# Patient Record
Sex: Female | Born: 1945
Health system: Southern US, Community
[De-identification: ages and names within clinical notes are randomized; demographics above are authoritative.]

## PROBLEM LIST (undated history)

## (undated) DIAGNOSIS — K760 Fatty (change of) liver, not elsewhere classified: Secondary | ICD-10-CM

## (undated) DIAGNOSIS — Z9221 Personal history of antineoplastic chemotherapy: Secondary | ICD-10-CM

## (undated) DIAGNOSIS — R200 Anesthesia of skin: Secondary | ICD-10-CM

## (undated) DIAGNOSIS — I10 Essential (primary) hypertension: Secondary | ICD-10-CM

## (undated) DIAGNOSIS — E669 Obesity, unspecified: Secondary | ICD-10-CM

## (undated) DIAGNOSIS — C50919 Malignant neoplasm of unspecified site of unspecified female breast: Secondary | ICD-10-CM

## (undated) DIAGNOSIS — D649 Anemia, unspecified: Secondary | ICD-10-CM

## (undated) DIAGNOSIS — T7840XA Allergy, unspecified, initial encounter: Secondary | ICD-10-CM

## (undated) DIAGNOSIS — N289 Disorder of kidney and ureter, unspecified: Secondary | ICD-10-CM

## (undated) DIAGNOSIS — E113291 Type 2 diabetes mellitus with mild nonproliferative diabetic retinopathy without macular edema, right eye: Secondary | ICD-10-CM

## (undated) DIAGNOSIS — M1710 Unilateral primary osteoarthritis, unspecified knee: Secondary | ICD-10-CM

## (undated) DIAGNOSIS — E785 Hyperlipidemia, unspecified: Secondary | ICD-10-CM

## (undated) DIAGNOSIS — Z923 Personal history of irradiation: Secondary | ICD-10-CM

## (undated) DIAGNOSIS — Z8639 Personal history of other endocrine, nutritional and metabolic disease: Secondary | ICD-10-CM

## (undated) DIAGNOSIS — M179 Osteoarthritis of knee, unspecified: Secondary | ICD-10-CM

## (undated) DIAGNOSIS — R202 Paresthesia of skin: Secondary | ICD-10-CM

## (undated) HISTORY — DX: Personal history of other endocrine, nutritional and metabolic disease: Z86.39

## (undated) HISTORY — DX: Fatty (change of) liver, not elsewhere classified: K76.0

## (undated) HISTORY — PX: TUBAL LIGATION: SHX77

## (undated) HISTORY — DX: Osteoarthritis of knee, unspecified: M17.9

## (undated) HISTORY — DX: Hyperlipidemia, unspecified: E78.5

## (undated) HISTORY — DX: Personal history of antineoplastic chemotherapy: Z92.21

## (undated) HISTORY — DX: Anemia, unspecified: D64.9

## (undated) HISTORY — PX: VAGINAL HYSTERECTOMY: SUR661

## (undated) HISTORY — DX: Allergy, unspecified, initial encounter: T78.40XA

## (undated) HISTORY — PX: PORTACATH PLACEMENT: SHX2246

## (undated) HISTORY — DX: Obesity, unspecified: E66.9

## (undated) HISTORY — PX: COLONOSCOPY W/ POLYPECTOMY: SHX1380

## (undated) HISTORY — DX: Disorder of kidney and ureter, unspecified: N28.9

## (undated) HISTORY — DX: Essential (primary) hypertension: I10

## (undated) HISTORY — PX: JOINT REPLACEMENT: SHX530

## (undated) HISTORY — DX: Unilateral primary osteoarthritis, unspecified knee: M17.10

## (undated) HISTORY — DX: Type 2 diabetes mellitus with mild nonproliferative diabetic retinopathy without macular edema, right eye: E11.3291

---

## 1998-02-21 ENCOUNTER — Ambulatory Visit (HOSPITAL_COMMUNITY): Admission: RE | Admit: 1998-02-21 | Discharge: 1998-02-21 | Payer: Self-pay

## 1998-02-22 ENCOUNTER — Encounter: Admission: RE | Admit: 1998-02-22 | Discharge: 1998-02-22 | Payer: Self-pay | Admitting: Internal Medicine

## 1998-02-22 ENCOUNTER — Encounter (INDEPENDENT_AMBULATORY_CARE_PROVIDER_SITE_OTHER): Payer: Self-pay | Admitting: Hospitalist

## 1998-02-22 ENCOUNTER — Ambulatory Visit (HOSPITAL_COMMUNITY): Admission: RE | Admit: 1998-02-22 | Discharge: 1998-02-22 | Payer: Self-pay | Admitting: *Deleted

## 1998-09-25 ENCOUNTER — Encounter: Admission: RE | Admit: 1998-09-25 | Discharge: 1998-09-25 | Payer: Self-pay | Admitting: Internal Medicine

## 1999-01-15 ENCOUNTER — Ambulatory Visit (HOSPITAL_COMMUNITY): Admission: RE | Admit: 1999-01-15 | Discharge: 1999-01-15 | Payer: Self-pay | Admitting: Gastroenterology

## 1999-01-17 ENCOUNTER — Encounter: Admission: RE | Admit: 1999-01-17 | Discharge: 1999-01-17 | Payer: Self-pay | Admitting: Hematology and Oncology

## 1999-07-11 ENCOUNTER — Encounter: Admission: RE | Admit: 1999-07-11 | Discharge: 1999-07-11 | Payer: Self-pay | Admitting: Hematology and Oncology

## 1999-07-22 ENCOUNTER — Encounter: Payer: Self-pay | Admitting: *Deleted

## 1999-07-22 ENCOUNTER — Ambulatory Visit (HOSPITAL_COMMUNITY): Admission: RE | Admit: 1999-07-22 | Discharge: 1999-07-22 | Payer: Self-pay | Admitting: *Deleted

## 1999-10-28 ENCOUNTER — Ambulatory Visit (HOSPITAL_BASED_OUTPATIENT_CLINIC_OR_DEPARTMENT_OTHER): Admission: RE | Admit: 1999-10-28 | Discharge: 1999-10-28 | Payer: Self-pay | Admitting: Orthopedic Surgery

## 1999-10-28 HISTORY — PX: OTHER SURGICAL HISTORY: SHX169

## 2000-07-27 ENCOUNTER — Ambulatory Visit (HOSPITAL_COMMUNITY): Admission: RE | Admit: 2000-07-27 | Discharge: 2000-07-27 | Payer: Self-pay | Admitting: *Deleted

## 2000-08-31 ENCOUNTER — Encounter: Admission: RE | Admit: 2000-08-31 | Discharge: 2000-08-31 | Payer: Self-pay | Admitting: Internal Medicine

## 2001-03-22 ENCOUNTER — Encounter: Admission: RE | Admit: 2001-03-22 | Discharge: 2001-03-22 | Payer: Self-pay

## 2001-04-07 ENCOUNTER — Encounter: Admission: RE | Admit: 2001-04-07 | Discharge: 2001-04-07 | Payer: Self-pay | Admitting: Internal Medicine

## 2001-06-14 ENCOUNTER — Encounter: Admission: RE | Admit: 2001-06-14 | Discharge: 2001-06-14 | Payer: Self-pay | Admitting: Internal Medicine

## 2001-06-28 ENCOUNTER — Encounter: Admission: RE | Admit: 2001-06-28 | Discharge: 2001-06-28 | Payer: Self-pay | Admitting: Internal Medicine

## 2001-07-28 ENCOUNTER — Ambulatory Visit (HOSPITAL_COMMUNITY): Admission: RE | Admit: 2001-07-28 | Discharge: 2001-07-28 | Payer: Self-pay

## 2001-09-15 ENCOUNTER — Encounter: Admission: RE | Admit: 2001-09-15 | Discharge: 2001-09-15 | Payer: Self-pay | Admitting: Internal Medicine

## 2001-12-15 ENCOUNTER — Encounter: Admission: RE | Admit: 2001-12-15 | Discharge: 2001-12-15 | Payer: Self-pay | Admitting: Internal Medicine

## 2002-05-02 ENCOUNTER — Encounter: Admission: RE | Admit: 2002-05-02 | Discharge: 2002-05-02 | Payer: Self-pay | Admitting: Internal Medicine

## 2002-06-17 ENCOUNTER — Encounter: Admission: RE | Admit: 2002-06-17 | Discharge: 2002-06-17 | Payer: Self-pay | Admitting: Internal Medicine

## 2002-08-01 ENCOUNTER — Ambulatory Visit (HOSPITAL_COMMUNITY): Admission: RE | Admit: 2002-08-01 | Discharge: 2002-08-01 | Payer: Self-pay | Admitting: Internal Medicine

## 2002-08-18 ENCOUNTER — Encounter: Admission: RE | Admit: 2002-08-18 | Discharge: 2002-08-18 | Payer: Self-pay | Admitting: Internal Medicine

## 2002-09-02 ENCOUNTER — Encounter: Admission: RE | Admit: 2002-09-02 | Discharge: 2002-09-02 | Payer: Self-pay | Admitting: Internal Medicine

## 2002-10-24 ENCOUNTER — Encounter: Admission: RE | Admit: 2002-10-24 | Discharge: 2002-10-24 | Payer: Self-pay | Admitting: Internal Medicine

## 2003-08-02 ENCOUNTER — Ambulatory Visit (HOSPITAL_COMMUNITY): Admission: RE | Admit: 2003-08-02 | Discharge: 2003-08-02 | Payer: Self-pay | Admitting: Internal Medicine

## 2003-09-15 ENCOUNTER — Encounter: Admission: RE | Admit: 2003-09-15 | Discharge: 2003-09-15 | Payer: Self-pay | Admitting: Internal Medicine

## 2003-09-27 ENCOUNTER — Ambulatory Visit (HOSPITAL_BASED_OUTPATIENT_CLINIC_OR_DEPARTMENT_OTHER): Admission: RE | Admit: 2003-09-27 | Discharge: 2003-09-27 | Payer: Self-pay | Admitting: Hospitalist

## 2003-10-05 ENCOUNTER — Encounter: Admission: RE | Admit: 2003-10-05 | Discharge: 2003-10-05 | Payer: Self-pay | Admitting: Internal Medicine

## 2004-02-21 ENCOUNTER — Ambulatory Visit: Payer: Self-pay | Admitting: Internal Medicine

## 2004-03-07 ENCOUNTER — Ambulatory Visit: Payer: Self-pay | Admitting: Internal Medicine

## 2004-03-07 ENCOUNTER — Ambulatory Visit (HOSPITAL_COMMUNITY): Admission: RE | Admit: 2004-03-07 | Discharge: 2004-03-07 | Payer: Self-pay | Admitting: Internal Medicine

## 2004-05-20 ENCOUNTER — Ambulatory Visit: Payer: Self-pay | Admitting: Internal Medicine

## 2004-05-31 ENCOUNTER — Encounter: Admission: RE | Admit: 2004-05-31 | Discharge: 2004-05-31 | Payer: Self-pay | Admitting: Gastroenterology

## 2004-06-03 ENCOUNTER — Ambulatory Visit: Payer: Self-pay | Admitting: Internal Medicine

## 2004-06-10 ENCOUNTER — Encounter: Admission: RE | Admit: 2004-06-10 | Discharge: 2004-06-10 | Payer: Self-pay | Admitting: Gastroenterology

## 2004-06-12 ENCOUNTER — Ambulatory Visit (HOSPITAL_COMMUNITY): Admission: RE | Admit: 2004-06-12 | Discharge: 2004-06-12 | Payer: Self-pay | Admitting: Gastroenterology

## 2004-06-17 ENCOUNTER — Encounter: Admission: RE | Admit: 2004-06-17 | Discharge: 2004-06-17 | Payer: Self-pay | Admitting: Gastroenterology

## 2004-07-08 ENCOUNTER — Ambulatory Visit: Payer: Self-pay | Admitting: Internal Medicine

## 2004-08-15 ENCOUNTER — Ambulatory Visit (HOSPITAL_COMMUNITY): Admission: RE | Admit: 2004-08-15 | Discharge: 2004-08-15 | Payer: Self-pay | Admitting: Family Medicine

## 2004-08-22 ENCOUNTER — Ambulatory Visit: Payer: Self-pay | Admitting: Internal Medicine

## 2004-08-28 ENCOUNTER — Ambulatory Visit: Payer: Self-pay | Admitting: Internal Medicine

## 2004-09-23 ENCOUNTER — Ambulatory Visit (HOSPITAL_COMMUNITY): Admission: RE | Admit: 2004-09-23 | Discharge: 2004-09-23 | Payer: Self-pay | Admitting: Gastroenterology

## 2005-05-15 ENCOUNTER — Ambulatory Visit: Payer: Self-pay | Admitting: Internal Medicine

## 2005-07-03 ENCOUNTER — Ambulatory Visit: Payer: Self-pay | Admitting: Hospitalist

## 2005-08-12 ENCOUNTER — Encounter: Admission: RE | Admit: 2005-08-12 | Discharge: 2005-08-12 | Payer: Self-pay | Admitting: Hospitalist

## 2005-08-12 ENCOUNTER — Encounter (INDEPENDENT_AMBULATORY_CARE_PROVIDER_SITE_OTHER): Payer: Self-pay | Admitting: Hospitalist

## 2005-08-20 ENCOUNTER — Ambulatory Visit: Payer: Self-pay | Admitting: Internal Medicine

## 2005-09-10 ENCOUNTER — Ambulatory Visit: Payer: Self-pay | Admitting: Internal Medicine

## 2005-10-14 ENCOUNTER — Ambulatory Visit: Payer: Self-pay | Admitting: Internal Medicine

## 2006-01-01 ENCOUNTER — Ambulatory Visit: Payer: Self-pay | Admitting: Hospitalist

## 2006-01-06 ENCOUNTER — Ambulatory Visit: Payer: Self-pay | Admitting: Hospitalist

## 2006-01-06 ENCOUNTER — Ambulatory Visit (HOSPITAL_COMMUNITY): Admission: RE | Admit: 2006-01-06 | Discharge: 2006-01-06 | Payer: Self-pay | Admitting: Hospitalist

## 2006-01-16 ENCOUNTER — Ambulatory Visit: Payer: Self-pay | Admitting: Internal Medicine

## 2006-01-22 ENCOUNTER — Ambulatory Visit: Payer: Self-pay | Admitting: Internal Medicine

## 2006-03-10 DIAGNOSIS — I10 Essential (primary) hypertension: Secondary | ICD-10-CM

## 2006-03-10 DIAGNOSIS — J309 Allergic rhinitis, unspecified: Secondary | ICD-10-CM | POA: Insufficient documentation

## 2006-03-10 DIAGNOSIS — I152 Hypertension secondary to endocrine disorders: Secondary | ICD-10-CM | POA: Insufficient documentation

## 2006-03-10 DIAGNOSIS — E1129 Type 2 diabetes mellitus with other diabetic kidney complication: Secondary | ICD-10-CM | POA: Insufficient documentation

## 2006-03-10 DIAGNOSIS — E1159 Type 2 diabetes mellitus with other circulatory complications: Secondary | ICD-10-CM | POA: Insufficient documentation

## 2006-03-10 DIAGNOSIS — E785 Hyperlipidemia, unspecified: Secondary | ICD-10-CM | POA: Insufficient documentation

## 2006-03-10 DIAGNOSIS — D649 Anemia, unspecified: Secondary | ICD-10-CM | POA: Insufficient documentation

## 2006-03-10 DIAGNOSIS — K760 Fatty (change of) liver, not elsewhere classified: Secondary | ICD-10-CM | POA: Insufficient documentation

## 2006-04-13 ENCOUNTER — Ambulatory Visit: Payer: Self-pay | Admitting: Hospitalist

## 2006-04-13 ENCOUNTER — Inpatient Hospital Stay (HOSPITAL_COMMUNITY): Admission: AD | Admit: 2006-04-13 | Discharge: 2006-04-14 | Payer: Self-pay | Admitting: Hospitalist

## 2006-04-13 ENCOUNTER — Ambulatory Visit: Payer: Self-pay | Admitting: Internal Medicine

## 2006-06-10 ENCOUNTER — Telehealth (INDEPENDENT_AMBULATORY_CARE_PROVIDER_SITE_OTHER): Payer: Self-pay | Admitting: *Deleted

## 2006-06-26 ENCOUNTER — Encounter (INDEPENDENT_AMBULATORY_CARE_PROVIDER_SITE_OTHER): Payer: Self-pay | Admitting: *Deleted

## 2006-07-09 ENCOUNTER — Ambulatory Visit: Payer: Self-pay | Admitting: Hospitalist

## 2006-07-09 DIAGNOSIS — E669 Obesity, unspecified: Secondary | ICD-10-CM | POA: Insufficient documentation

## 2006-07-09 LAB — CONVERTED CEMR LAB: Blood Glucose, Fingerstick: 314

## 2006-08-10 ENCOUNTER — Ambulatory Visit (HOSPITAL_COMMUNITY): Admission: RE | Admit: 2006-08-10 | Discharge: 2006-08-10 | Payer: Self-pay | Admitting: Family Medicine

## 2006-11-30 ENCOUNTER — Telehealth (INDEPENDENT_AMBULATORY_CARE_PROVIDER_SITE_OTHER): Payer: Self-pay | Admitting: *Deleted

## 2006-12-02 ENCOUNTER — Encounter (INDEPENDENT_AMBULATORY_CARE_PROVIDER_SITE_OTHER): Payer: Self-pay | Admitting: Hospitalist

## 2006-12-02 ENCOUNTER — Ambulatory Visit: Payer: Self-pay | Admitting: Internal Medicine

## 2006-12-02 LAB — CONVERTED CEMR LAB
ALT: 19 units/L (ref 0–35)
AST: 16 units/L (ref 0–37)
Albumin: 4.4 g/dL (ref 3.5–5.2)
Alkaline Phosphatase: 64 units/L (ref 39–117)
BUN: 11 mg/dL (ref 6–23)
CO2: 29 meq/L (ref 19–32)
Calcium: 9.4 mg/dL (ref 8.4–10.5)
Chloride: 100 meq/L (ref 96–112)
Cholesterol: 142 mg/dL (ref 0–200)
Creatinine, Ser: 0.91 mg/dL (ref 0.40–1.20)
Glucose, Bld: 148 mg/dL — ABNORMAL HIGH (ref 70–99)
HDL: 42 mg/dL (ref >39)
Hgb A1c MFr Bld: 8.9 %
LDL Cholesterol: 79 mg/dL (ref 0–99)
Potassium: 4.2 meq/L (ref 3.5–5.3)
Sodium: 140 meq/L (ref 135–145)
Total Bilirubin: 0.4 mg/dL (ref 0.3–1.2)
Total CHOL/HDL Ratio: 3.4
Total Protein: 6.9 g/dL (ref 6.0–8.3)
Triglycerides: 104 mg/dL (ref <150)
VLDL: 21 mg/dL (ref 0–40)

## 2007-01-21 ENCOUNTER — Ambulatory Visit: Payer: Self-pay | Admitting: Hospitalist

## 2007-01-21 LAB — CONVERTED CEMR LAB: Blood Glucose, Fingerstick: 280

## 2007-03-25 ENCOUNTER — Ambulatory Visit: Payer: Self-pay | Admitting: Hospitalist

## 2007-03-25 DIAGNOSIS — M1990 Unspecified osteoarthritis, unspecified site: Secondary | ICD-10-CM | POA: Insufficient documentation

## 2007-03-25 LAB — CONVERTED CEMR LAB
Blood Glucose, Fingerstick: 127
Hgb A1c MFr Bld: 7.9 %

## 2007-03-26 ENCOUNTER — Telehealth: Payer: Self-pay | Admitting: *Deleted

## 2007-04-16 ENCOUNTER — Encounter (INDEPENDENT_AMBULATORY_CARE_PROVIDER_SITE_OTHER): Payer: Self-pay | Admitting: Hospitalist

## 2007-04-16 ENCOUNTER — Ambulatory Visit: Payer: Self-pay | Admitting: Internal Medicine

## 2007-04-16 LAB — CONVERTED CEMR LAB
BUN: 12 mg/dL (ref 6–23)
CO2: 25 meq/L (ref 19–32)
Calcium: 9.5 mg/dL (ref 8.4–10.5)
Chloride: 101 meq/L (ref 96–112)
Creatinine, Ser: 0.93 mg/dL (ref 0.40–1.20)
Creatinine, Urine: 249.4 mg/dL
Glucose, Bld: 199 mg/dL — ABNORMAL HIGH (ref 70–99)
Microalb Creat Ratio: 9.2 mg/g (ref 0.0–30.0)
Microalb, Ur: 2.3 mg/dL — ABNORMAL HIGH (ref 0.00–1.89)
Potassium: 3.9 meq/L (ref 3.5–5.3)
Sodium: 141 meq/L (ref 135–145)
TSH: 2.663 microintl units/mL (ref 0.350–5.50)

## 2007-04-20 ENCOUNTER — Ambulatory Visit (HOSPITAL_COMMUNITY): Admission: RE | Admit: 2007-04-20 | Discharge: 2007-04-20 | Payer: Self-pay | Admitting: Hospitalist

## 2007-07-19 ENCOUNTER — Ambulatory Visit (HOSPITAL_COMMUNITY): Admission: RE | Admit: 2007-07-19 | Discharge: 2007-07-19 | Payer: Self-pay | Admitting: Hospitalist

## 2007-07-19 ENCOUNTER — Encounter: Payer: Self-pay | Admitting: Internal Medicine

## 2007-07-19 ENCOUNTER — Ambulatory Visit: Payer: Self-pay | Admitting: Hospitalist

## 2007-07-19 ENCOUNTER — Encounter: Payer: Self-pay | Admitting: *Deleted

## 2007-07-19 DIAGNOSIS — Z860100 Personal history of colon polyps, unspecified: Secondary | ICD-10-CM | POA: Insufficient documentation

## 2007-07-19 LAB — CONVERTED CEMR LAB
Bilirubin Urine: NEGATIVE
Hemoglobin, Urine: NEGATIVE
Ketones, ur: NEGATIVE mg/dL
Leukocytes, UA: NEGATIVE
Nitrite: NEGATIVE
Protein, ur: NEGATIVE mg/dL
Specific Gravity, Urine: 1.024 (ref 1.005–1.03)
Urine Glucose: 500 mg/dL — AB
Urobilinogen, UA: 0.2 (ref 0.0–1.0)
pH: 5.5 (ref 5.0–8.0)

## 2007-07-21 ENCOUNTER — Ambulatory Visit: Payer: Self-pay | Admitting: Infectious Diseases

## 2007-07-21 ENCOUNTER — Encounter: Payer: Self-pay | Admitting: Internal Medicine

## 2007-07-22 LAB — CONVERTED CEMR LAB
ALT: 26 units/L (ref 0–35)
AST: 20 units/L (ref 0–37)
Albumin: 4.4 g/dL (ref 3.5–5.2)
Alkaline Phosphatase: 67 units/L (ref 39–117)
BUN: 11 mg/dL (ref 6–23)
Basophils Absolute: 0 10*3/uL (ref 0.0–0.1)
Basophils Relative: 1 % (ref 0–1)
CO2: 25 meq/L (ref 19–32)
Calcium: 9.1 mg/dL (ref 8.4–10.5)
Chloride: 102 meq/L (ref 96–112)
Creatinine, Ser: 0.85 mg/dL (ref 0.40–1.20)
Eosinophils Absolute: 0.1 10*3/uL (ref 0.0–0.7)
Eosinophils Relative: 2 % (ref 0–5)
Glucose, Bld: 212 mg/dL — ABNORMAL HIGH (ref 70–99)
HCT: 36.6 % (ref 36.0–46.0)
Hemoglobin: 11.7 g/dL — ABNORMAL LOW (ref 12.0–15.0)
Lymphocytes Relative: 41 % (ref 12–46)
Lymphs Abs: 2.1 10*3/uL (ref 0.7–4.0)
MCHC: 32 g/dL (ref 30.0–36.0)
MCV: 92.7 fL (ref 78.0–100.0)
Monocytes Absolute: 0.3 10*3/uL (ref 0.1–1.0)
Monocytes Relative: 7 % (ref 3–12)
Neutro Abs: 2.6 10*3/uL (ref 1.7–7.7)
Neutrophils Relative %: 50 % (ref 43–77)
Platelets: 193 10*3/uL (ref 150–400)
Potassium: 3.8 meq/L (ref 3.5–5.3)
RBC: 3.95 M/uL (ref 3.87–5.11)
RDW: 16.6 % — ABNORMAL HIGH (ref 11.5–15.5)
Sodium: 140 meq/L (ref 135–145)
Total Bilirubin: 0.4 mg/dL (ref 0.3–1.2)
Total Protein: 6.9 g/dL (ref 6.0–8.3)
WBC: 5.1 10*3/uL (ref 4.0–10.5)

## 2007-08-26 ENCOUNTER — Ambulatory Visit (HOSPITAL_COMMUNITY): Admission: RE | Admit: 2007-08-26 | Discharge: 2007-08-26 | Payer: Self-pay | Admitting: Hospitalist

## 2007-08-31 ENCOUNTER — Ambulatory Visit: Payer: Self-pay | Admitting: Hospitalist

## 2007-08-31 LAB — CONVERTED CEMR LAB
Blood Glucose, Fingerstick: 236
Creatinine, Urine: 136.7 mg/dL
Hgb A1c MFr Bld: 8.2 %
Microalb Creat Ratio: 6.2 mg/g (ref 0.0–30.0)
Microalb, Ur: 0.85 mg/dL (ref 0.00–1.89)

## 2007-10-15 ENCOUNTER — Ambulatory Visit: Payer: Self-pay | Admitting: Internal Medicine

## 2007-10-15 LAB — CONVERTED CEMR LAB
BUN: 12 mg/dL (ref 6–23)
CO2: 27 meq/L (ref 19–32)
Calcium: 9.2 mg/dL (ref 8.4–10.5)
Chloride: 105 meq/L (ref 96–112)
Creatinine, Ser: 0.81 mg/dL (ref 0.40–1.20)
Glucose, Bld: 122 mg/dL — ABNORMAL HIGH (ref 70–99)
Potassium: 3.6 meq/L (ref 3.5–5.3)
Sodium: 144 meq/L (ref 135–145)

## 2008-01-04 ENCOUNTER — Encounter: Payer: Self-pay | Admitting: Internal Medicine

## 2008-01-04 ENCOUNTER — Ambulatory Visit: Payer: Self-pay | Admitting: Internal Medicine

## 2008-01-04 LAB — CONVERTED CEMR LAB
ALT: 25 units/L (ref 0–35)
AST: 17 units/L (ref 0–37)
Albumin: 4 g/dL (ref 3.5–5.2)
Alkaline Phosphatase: 47 units/L (ref 39–117)
BUN: 13 mg/dL (ref 6–23)
CO2: 28 meq/L (ref 19–32)
Calcium: 8.4 mg/dL (ref 8.4–10.5)
Chloride: 103 meq/L (ref 96–112)
Cholesterol: 172 mg/dL (ref 0–200)
Creatinine, Ser: 0.72 mg/dL (ref 0.40–1.20)
Glucose, Bld: 167 mg/dL — ABNORMAL HIGH (ref 70–99)
HDL: 40 mg/dL (ref >39)
LDL Cholesterol: 111 mg/dL — ABNORMAL HIGH (ref 0–99)
Potassium: 3.6 meq/L (ref 3.5–5.3)
Sodium: 141 meq/L (ref 135–145)
Total Bilirubin: 0.5 mg/dL (ref 0.3–1.2)
Total CHOL/HDL Ratio: 4.3
Total Protein: 6.3 g/dL (ref 6.0–8.3)
Triglycerides: 103 mg/dL (ref <150)
VLDL: 21 mg/dL (ref 0–40)

## 2008-01-07 ENCOUNTER — Telehealth: Payer: Self-pay | Admitting: Internal Medicine

## 2008-02-10 ENCOUNTER — Telehealth: Payer: Self-pay | Admitting: Internal Medicine

## 2008-03-02 ENCOUNTER — Ambulatory Visit: Payer: Self-pay | Admitting: Internal Medicine

## 2008-03-02 ENCOUNTER — Telehealth (INDEPENDENT_AMBULATORY_CARE_PROVIDER_SITE_OTHER): Payer: Self-pay | Admitting: *Deleted

## 2008-04-10 ENCOUNTER — Ambulatory Visit: Payer: Self-pay | Admitting: Internal Medicine

## 2008-04-10 ENCOUNTER — Encounter: Payer: Self-pay | Admitting: Internal Medicine

## 2008-04-10 LAB — CONVERTED CEMR LAB
BUN: 13 mg/dL (ref 6–23)
Basophils Absolute: 0 10*3/uL (ref 0.0–0.1)
Basophils Relative: 1 % (ref 0–1)
CO2: 26 meq/L (ref 19–32)
Calcium: 9.3 mg/dL (ref 8.4–10.5)
Chloride: 102 meq/L (ref 96–112)
Cholesterol: 145 mg/dL (ref 0–200)
Creatinine, Ser: 0.79 mg/dL (ref 0.40–1.20)
Eosinophils Absolute: 0.1 10*3/uL (ref 0.0–0.7)
Eosinophils Relative: 1 % (ref 0–5)
Glucose, Bld: 142 mg/dL — ABNORMAL HIGH (ref 70–99)
HCT: 37.4 % (ref 36.0–46.0)
HDL: 36 mg/dL — ABNORMAL LOW (ref >39)
Hemoglobin: 12.1 g/dL (ref 12.0–15.0)
Hgb A1c MFr Bld: 7.6 %
LDL Cholesterol: 91 mg/dL (ref 0–99)
Lymphocytes Relative: 47 % — ABNORMAL HIGH (ref 12–46)
Lymphs Abs: 2.7 10*3/uL (ref 0.7–4.0)
MCHC: 32.4 g/dL (ref 30.0–36.0)
MCV: 92.1 fL (ref 78.0–100.0)
Monocytes Absolute: 0.4 10*3/uL (ref 0.1–1.0)
Monocytes Relative: 7 % (ref 3–12)
Neutro Abs: 2.6 10*3/uL (ref 1.7–7.7)
Neutrophils Relative %: 45 % (ref 43–77)
Platelets: 242 10*3/uL (ref 150–400)
Potassium: 4.2 meq/L (ref 3.5–5.3)
RBC: 4.06 M/uL (ref 3.87–5.11)
RDW: 16.4 % — ABNORMAL HIGH (ref 11.5–15.5)
Sodium: 143 meq/L (ref 135–145)
Total CHOL/HDL Ratio: 4
Triglycerides: 92 mg/dL (ref <150)
VLDL: 18 mg/dL (ref 0–40)
WBC: 5.8 10*3/uL (ref 4.0–10.5)

## 2008-06-12 ENCOUNTER — Telehealth: Payer: Self-pay | Admitting: Internal Medicine

## 2008-07-31 ENCOUNTER — Ambulatory Visit: Payer: Self-pay | Admitting: Internal Medicine

## 2008-07-31 ENCOUNTER — Encounter: Payer: Self-pay | Admitting: Internal Medicine

## 2008-07-31 LAB — CONVERTED CEMR LAB
Blood Glucose, Fingerstick: 192
Hgb A1c MFr Bld: 6.7 %

## 2008-08-28 ENCOUNTER — Ambulatory Visit (HOSPITAL_COMMUNITY): Admission: RE | Admit: 2008-08-28 | Discharge: 2008-08-28 | Payer: Self-pay | Admitting: Internal Medicine

## 2008-10-04 ENCOUNTER — Ambulatory Visit: Payer: Self-pay | Admitting: Internal Medicine

## 2008-10-04 ENCOUNTER — Encounter (INDEPENDENT_AMBULATORY_CARE_PROVIDER_SITE_OTHER): Payer: Self-pay | Admitting: Internal Medicine

## 2008-10-04 DIAGNOSIS — M545 Low back pain, unspecified: Secondary | ICD-10-CM | POA: Insufficient documentation

## 2008-10-04 LAB — CONVERTED CEMR LAB
BUN: 13 mg/dL (ref 6–23)
Blood Glucose, Fingerstick: 168
CO2: 26 meq/L (ref 19–32)
Calcium: 9.7 mg/dL (ref 8.4–10.5)
Chloride: 102 meq/L (ref 96–112)
Creatinine, Ser: 0.91 mg/dL (ref 0.40–1.20)
Creatinine, Urine: 304 mg/dL
GFR calc Af Amer: >60 mL/min (ref >60)
GFR calc non Af Amer: >60 mL/min (ref >60)
Glucose, Bld: 198 mg/dL — ABNORMAL HIGH (ref 70–99)
Hgb A1c MFr Bld: 7.3 %
Microalb Creat Ratio: 11 mg/g (ref 0.0–30.0)
Microalb, Ur: 3.33 mg/dL — ABNORMAL HIGH (ref 0.00–1.89)
Potassium: 4.1 meq/L (ref 3.5–5.3)
Sodium: 142 meq/L (ref 135–145)

## 2008-10-13 ENCOUNTER — Encounter (INDEPENDENT_AMBULATORY_CARE_PROVIDER_SITE_OTHER): Payer: Self-pay | Admitting: Internal Medicine

## 2008-10-13 ENCOUNTER — Ambulatory Visit: Payer: Self-pay | Admitting: Internal Medicine

## 2008-10-13 LAB — CONVERTED CEMR LAB
Cholesterol: 169 mg/dL (ref 0–200)
HDL: 42 mg/dL (ref >39)
LDL Cholesterol: 107 mg/dL — ABNORMAL HIGH (ref 0–99)
Total CHOL/HDL Ratio: 4
Triglycerides: 99 mg/dL (ref <150)
VLDL: 20 mg/dL (ref 0–40)

## 2008-10-27 LAB — HM DIABETES EYE EXAM

## 2008-11-24 ENCOUNTER — Telehealth: Payer: Self-pay | Admitting: Internal Medicine

## 2008-12-29 ENCOUNTER — Ambulatory Visit: Payer: Self-pay | Admitting: Internal Medicine

## 2009-01-01 ENCOUNTER — Encounter: Payer: Self-pay | Admitting: Internal Medicine

## 2009-01-11 ENCOUNTER — Telehealth: Payer: Self-pay | Admitting: Internal Medicine

## 2009-03-08 ENCOUNTER — Ambulatory Visit: Payer: Self-pay | Admitting: Internal Medicine

## 2009-03-19 ENCOUNTER — Telehealth (INDEPENDENT_AMBULATORY_CARE_PROVIDER_SITE_OTHER): Payer: Self-pay | Admitting: *Deleted

## 2009-03-23 ENCOUNTER — Telehealth (INDEPENDENT_AMBULATORY_CARE_PROVIDER_SITE_OTHER): Payer: Self-pay | Admitting: *Deleted

## 2009-04-02 ENCOUNTER — Telehealth: Payer: Self-pay | Admitting: Internal Medicine

## 2009-07-03 ENCOUNTER — Ambulatory Visit: Payer: Self-pay | Admitting: Internal Medicine

## 2009-07-03 LAB — CONVERTED CEMR LAB
Blood Glucose, Fingerstick: 206
Hgb A1c MFr Bld: 8 %

## 2009-07-13 ENCOUNTER — Ambulatory Visit: Payer: Self-pay | Admitting: Infectious Diseases

## 2009-07-13 LAB — CONVERTED CEMR LAB: Blood Glucose, Home Monitor: 1 mg/dL

## 2009-07-25 ENCOUNTER — Telehealth (INDEPENDENT_AMBULATORY_CARE_PROVIDER_SITE_OTHER): Payer: Self-pay | Admitting: *Deleted

## 2009-07-30 ENCOUNTER — Telehealth: Payer: Self-pay | Admitting: Internal Medicine

## 2009-08-14 ENCOUNTER — Ambulatory Visit: Payer: Self-pay | Admitting: Internal Medicine

## 2009-08-15 LAB — CONVERTED CEMR LAB
Cholesterol: 144 mg/dL (ref 0–200)
HDL: 36 mg/dL — ABNORMAL LOW (ref >39)
LDL Cholesterol: 83 mg/dL (ref 0–99)
Total CHOL/HDL Ratio: 4
Triglycerides: 124 mg/dL (ref <150)
VLDL: 25 mg/dL (ref 0–40)

## 2009-08-27 ENCOUNTER — Encounter: Payer: Self-pay | Admitting: Internal Medicine

## 2009-08-27 ENCOUNTER — Encounter: Admission: RE | Admit: 2009-08-27 | Discharge: 2009-08-27 | Payer: Self-pay | Admitting: Gastroenterology

## 2009-08-28 ENCOUNTER — Encounter: Payer: Self-pay | Admitting: Internal Medicine

## 2009-08-30 ENCOUNTER — Ambulatory Visit (HOSPITAL_COMMUNITY): Admission: RE | Admit: 2009-08-30 | Discharge: 2009-08-30 | Payer: Self-pay | Admitting: Internal Medicine

## 2009-08-30 LAB — HM MAMMOGRAPHY: HM Mammogram: NEGATIVE

## 2009-08-31 ENCOUNTER — Encounter: Payer: Self-pay | Admitting: Internal Medicine

## 2009-09-03 ENCOUNTER — Telehealth: Payer: Self-pay | Admitting: Internal Medicine

## 2009-10-22 LAB — HM COLONOSCOPY

## 2009-10-24 ENCOUNTER — Encounter: Payer: Self-pay | Admitting: Internal Medicine

## 2010-01-07 ENCOUNTER — Encounter: Payer: Self-pay | Admitting: Emergency Medicine

## 2010-01-08 ENCOUNTER — Encounter: Payer: Self-pay | Admitting: Internal Medicine

## 2010-01-08 ENCOUNTER — Ambulatory Visit: Payer: Self-pay | Admitting: Internal Medicine

## 2010-01-08 ENCOUNTER — Inpatient Hospital Stay (HOSPITAL_COMMUNITY): Admission: EM | Admit: 2010-01-08 | Discharge: 2010-01-11 | Payer: Self-pay | Admitting: Internal Medicine

## 2010-01-08 ENCOUNTER — Encounter (INDEPENDENT_AMBULATORY_CARE_PROVIDER_SITE_OTHER): Payer: Self-pay | Admitting: *Deleted

## 2010-01-08 LAB — CONVERTED CEMR LAB: Hgb A1c MFr Bld: 8.1 %

## 2010-01-11 ENCOUNTER — Encounter (INDEPENDENT_AMBULATORY_CARE_PROVIDER_SITE_OTHER): Payer: Self-pay | Admitting: *Deleted

## 2010-01-11 ENCOUNTER — Encounter: Payer: Self-pay | Admitting: Internal Medicine

## 2010-01-23 ENCOUNTER — Ambulatory Visit: Payer: Self-pay | Admitting: Internal Medicine

## 2010-01-23 LAB — CONVERTED CEMR LAB
ALT: 21 units/L (ref 0–35)
AST: 14 units/L (ref 0–37)
Albumin: 4.4 g/dL (ref 3.5–5.2)
Alkaline Phosphatase: 79 units/L (ref 39–117)
BUN: 11 mg/dL (ref 6–23)
Blood Glucose, Fingerstick: 294
CO2: 27 meq/L (ref 19–32)
Calcium: 9.5 mg/dL (ref 8.4–10.5)
Chloride: 100 meq/L (ref 96–112)
Creatinine, Ser: 0.77 mg/dL (ref 0.40–1.20)
Glucose, Bld: 265 mg/dL — ABNORMAL HIGH (ref 70–99)
Potassium: 4.1 meq/L (ref 3.5–5.3)
Sodium: 139 meq/L (ref 135–145)
Total Bilirubin: 0.5 mg/dL (ref 0.3–1.2)
Total Protein: 6.7 g/dL (ref 6.0–8.3)

## 2010-01-28 ENCOUNTER — Telehealth: Payer: Self-pay | Admitting: Licensed Clinical Social Worker

## 2010-01-31 ENCOUNTER — Encounter: Payer: Self-pay | Admitting: Licensed Clinical Social Worker

## 2010-01-31 ENCOUNTER — Ambulatory Visit: Payer: Self-pay | Admitting: Internal Medicine

## 2010-01-31 LAB — CONVERTED CEMR LAB: Blood Glucose, Fingerstick: 157

## 2010-03-19 ENCOUNTER — Ambulatory Visit: Payer: Self-pay | Admitting: Internal Medicine

## 2010-03-19 ENCOUNTER — Telehealth: Payer: Self-pay | Admitting: Internal Medicine

## 2010-03-19 LAB — CONVERTED CEMR LAB
Blood Glucose, Fingerstick: 182
Hgb A1c MFr Bld: 7.5 %

## 2010-03-19 LAB — HM DIABETES FOOT EXAM
HM Diabetic Foot Exam: NORMAL
HM Diabetic Foot Exam: NORMAL

## 2010-03-20 ENCOUNTER — Encounter: Payer: Self-pay | Admitting: Internal Medicine

## 2010-03-20 LAB — CONVERTED CEMR LAB
Creatinine, Urine: 58 mg/dL
Microalb Creat Ratio: 8.6 mg/g (ref 0.0–30.0)
Microalb, Ur: 0.5 mg/dL (ref 0.00–1.89)

## 2010-04-03 ENCOUNTER — Telehealth: Payer: Self-pay | Admitting: Internal Medicine

## 2010-04-17 ENCOUNTER — Ambulatory Visit: Payer: Self-pay | Admitting: Internal Medicine

## 2010-04-17 LAB — CONVERTED CEMR LAB: Blood Glucose, Fingerstick: 223

## 2010-06-04 NOTE — Progress Notes (Signed)
Summary: Refill/gh  Phone Note Refill Request Message from:  Pharmacy on January 07, 2008 12:10 PM  Refills Requested: Medication #1:  METFORMIN HCL 500 MG  TABS Take 1 tablet by mouth two times a day   Last Refilled: 11/29/2007  Method Requested: Electronic Initial call taken by: Angelina Ok RN,  January 07, 2008 12:11 PM  Follow-up for Phone Call       Follow-up by: [name],  January 07, 2008 12:23 PM      Prescriptions: METFORMIN HCL 500 MG  TABS (METFORMIN HCL) Take 1 tablet by mouth two times a day  #60 x prn   Entered and Authorized by:   [name]   Signed by:   [name] on 01/07/2008   Method used:   Electronically to        [facility]* (retail)       [address]       [place], [place]  [address]       Ph: [phone]       Fax: [phone]   RxID:   [id number]

## 2010-06-04 NOTE — Progress Notes (Signed)
  Phone Note Outgoing Call   Summary of Call: Referred by Dr. Baltazar Apo.  Patient coming for appmt Thursday at 1:30.  Left message for her to call social work to see if she could come in a little earlier.   Patient will see me at 12:30 before her appmt with Dr. Baltazar Apo.

## 2010-06-04 NOTE — Letter (Signed)
Summary: Diabetic Foot Exam  Diabetic Foot Exam   Imported By: Raynaldo Opitz 03/11/2006 18:15:44  _____________________________________________________________________  External Attachment:    Type:   Image     Comment:   External Document

## 2010-06-04 NOTE — Assessment & Plan Note (Signed)
Summary: new to md reassign checkup [mkj]   Vital Signs:  Patient Profile:   65 Years Old Female Height:     59 inches (149.86 cm) Weight:      176.3 pounds (80.14 kg) BMI:     35.74 Temp:     97.7 degrees F (36.50 degrees C) oral Pulse rate:   63 / minute BP sitting:   167 / 89  (right arm)  Pt. in pain?   yes    Location:   h/a    Intensity:   3-4  Vitals Entered By: Stanton Kidney Ditzler RN (January 04, 2008 9:44 AM)              Is Patient Diabetic? Yes  Nutritional Status BMI of > 30 = obese Nutritional Status Detail appetite ok  Have you ever been in a relationship where you felt threatened, hurt or afraid?denies   Does patient need assistance? Functional Status Self care Ambulation Normal     PCP:  Eliseo Gum MD  Chief Complaint:  Discuss Zyrtec and chol med ( out for 3 months - no money )..  History of Present Illness: [name] is a 65 yo woman with PMH as outlined in chart.  She is here today for routine visit and reassignment to new MD.  Pt has continued problems with her allergies, feeling pressure in ears and itching from ear to throat.  She uses zyrtec, but feels it doesn't work as well as it used to.  However, she has used claritin in the past without success, and zyrtec still helping overall.  Furthermore, she states her cholesterol medication, lovastatin, was costing about $40 dollars a month and she could not afford it (getting it at [facility]).      Current Allergies (reviewed today): ! GLUCOPHAGE    Risk Factors: Tobacco use:  never Drug use:  no Alcohol use:  no Exercise:  no Seatbelt use:  100 %  Colonoscopy History:    Date of Last Colonoscopy:  09/23/2004  Mammogram History:    Date of Last Mammogram:  08/26/2007    Physical Exam  General:     alert, well-nourished, well-hydrated, and overweight-appearing.   Head:     normocephalic and atraumatic.   Eyes:     pupils equal, pupils round, and pupils reactive to light.   Nose:   no external deformity, no external erythema, and no nasal discharge.   Mouth:     pharynx pink and moist.   Neck:     supple and full ROM.   Lungs:     normal respiratory effort, no intercostal retractions, no accessory muscle use, normal breath sounds, no crackles, and no wheezes.   Heart:     normal rate, regular rhythm, no murmur, no gallop, no rub, and no JVD.   Abdomen:     soft, non-tender, normal bowel sounds, and no distention.   Neurologic:     alert & oriented X3, cranial nerves II-XII intact, strength normal in all extremities, sensation intact to light touch, and gait normal.      Impression & Recommendations:  Problem # 1:  DIABETES MELLITUS, TYPE II (ICD-250.00) Will check A1c today, pt not at goal as of last visit.  Will change glipizide to two times a day since dose >15mg /day.  Will likely increase dose further since pt cannot tolerate higher dose of metformin 2/2 diarrhea.   Her updated medication list for this problem includes:    Glipizide 10 Mg Tabs (  Glipizide) .Marland Kitchen... Take 2 tablets once daily    Aspirin 81 Mg Tbec (Aspirin) .Marland Kitchen... Take 1 tablet by mouth once a day    Metformin Hcl 500 Mg Tabs (Metformin hcl) .Marland Kitchen... Take 1 tablet by mouth two times a day    Lisinopril 40 Mg Tabs (Lisinopril) .Marland Kitchen... Take 1 tablet by mouth once a day  Orders: T- Capillary Blood Glucose (82948) T-Hgb A1C (in-house) (10258NI)   Problem # 2:  HYPERTENSION (ICD-401.9) Will need higher dose of meds.  Will change to individual meds and increase lisinopril to 40mg  daily.  Will need BMET in few weeks to follow Cr and K.  Will put future order in and notify pt.  May finish present supply.  Her updated medication list for this problem includes:    Hydrochlorothiazide 25 Mg Tabs (Hydrochlorothiazide) .Marland Kitchen... Take 1 tablet by mouth once a day    Lisinopril 40 Mg Tabs (Lisinopril) .Marland Kitchen... Take 1 tablet by mouth once a day  Orders: T-Comprehensive Metabolic Panel [id number]) T-Lipid Profile  ([id number])  Future Orders: T-Basic Metabolic Panel [id number]) ... 01/18/2008   Problem # 3:  HYPERLIPIDEMIA (ICD-272.4) Not affording meds 2/2 $40 for lovastatin.  Only doses on $4 list are 10 and 20mg .  Since LDL almost at goal last year, will restart with 20mg  daily.  Checking LFTs and lipids today.   Her updated medication list for this problem includes:    Lovastatin 20 Mg Tabs (Lovastatin) .Marland Kitchen... Take 1 tab by mouth at bedtime  Orders: T-Comprehensive Metabolic Panel [id number]) T-Lipid Profile ([id number])   Complete Medication List: 1)  Lovastatin 20 Mg Tabs (Lovastatin) .... Take 1 tab by mouth at bedtime 2)  Zyrtec 10 Mg Tabs (Cetirizine hcl) .... Take 1 tablet by mouth once a day 3)  Glipizide 10 Mg Tabs (Glipizide) .... Take 2 tablets once daily 4)  Aspirin 81 Mg Tbec (Aspirin) .... Take 1 tablet by mouth once a day 5)  Metformin Hcl 500 Mg Tabs (Metformin hcl) .... Take 1 tablet by mouth two times a day 6)  Constulose 10 Gm/70ml Soln (Lactulose) .... Take 15 ml twice a day for constipation as needed. 7)  Hydrochlorothiazide 25 Mg Tabs (Hydrochlorothiazide) .... Take 1 tablet by mouth once a day 8)  Lisinopril 40 Mg Tabs (Lisinopril) .... Take 1 tablet by mouth once a day   Patient Instructions: 1)  Please schedule a follow-up appointment in 3 months. 2)  change glipizide to 10mg  two times a day. 3)  Will decrease lovastatin to 20mg  daily (on the $4 list). 4)  Continue with the zyrtec for allergies. 5)  Continue with aspirin for headaches, may also use tylenol if needed.    Prescriptions: LISINOPRIL 40 MG TABS (LISINOPRIL) Take 1 tablet by mouth once a day  #30 x 3   Entered and Authorized by:   [name]   Signed by:   [name] on 01/04/2008   Method used:   Electronically to        [facility]* (retail)       [address]       [place], [place]  [address]       Ph: [phone]       Fax: [phone]   RxID:    [id number] HYDROCHLOROTHIAZIDE 25 MG TABS (HYDROCHLOROTHIAZIDE) Take 1 tablet by mouth once a day  #30 x prn   Entered and Authorized by:   [name]   Signed by:   [name] on 01/04/2008  Method used:   Electronically to        [facility]* (retail)       [address]       [place], [place]  [address]       Ph: [phone]       Fax: [phone]   RxID:   [id number] LOVASTATIN 20 MG TABS (LOVASTATIN) Take 1 tab by mouth at bedtime  #30 x prn   Entered and Authorized by:   [name]   Signed by:   [name] on 01/04/2008   Method used:   Electronically to        [facility]* (retail)       [address]       [place], [place]  [address]       Ph: [phone]       Fax: [phone]   RxID:   [id number]  ]  Appended Document: lab results a1c/tfulcher    Lab Visit   Laboratory Results   Blood Tests   Date/Time Received: January 04, 2008 10:26 AM Date/Time Reported: [name]  January 04, 2008 10:26 AM  HGBA1C: 7.7%   (Normal Range: Non-Diabetic - 3-6%   Control Diabetic - 6-8%) CBG Random:: 164mg /dL     Orders Today:

## 2010-06-04 NOTE — Assessment & Plan Note (Signed)
Summary: walk-in/gg  Pt walked intoclinic c/o left side painx 3 days.  Rates pain 10/10 and constant. no known injury, no uti sx. Has not taken pain meds. Will see this AM Earney Hamburg RN

## 2010-06-04 NOTE — Consult Note (Signed)
Summary: [facility]  [facility]   Imported By: Margie Billet 11/01/2009 09:53:06  _____________________________________________________________________  External Attachment:    Type:   Image     Comment:   External Document  Appended Document: [facility]    Clinical Lists Changes  Observations: Added new observation of COLONRECACT: Further recommendations pending biopsy results.   (10/24/2009 12:14) Added new observation of COLONOSCOPY: Results: Polyp.  Awaiting result  (10/24/2009 12:14)       Colonoscopy  Procedure date:  10/24/2009  Findings:      Results: Polyp.  Awaiting result   Comments:      Further recommendations pending biopsy results.     Colonoscopy  Procedure date:  10/24/2009  Findings:      Results: Polyp.  Awaiting result   Comments:      Further recommendations pending biopsy results.

## 2010-06-04 NOTE — Letter (Signed)
Summary: OPhthalmologist: Dr. Mitzi Davenport  OPhthalmologist: Dr. Mitzi Davenport   Imported By: Florinda Marker 01/04/2009 14:25:52  _____________________________________________________________________  External Attachment:    Type:   Image     Comment:   External Document  Appended Document: OPhthalmologist: Dr. Mitzi Davenport    Clinical Lists Changes  Observations: Added new observation of [id number]: 11/2009 (01/07/2009 12:49) Added new observation of DIAB EYE EX: No diabetic retinopathy.    (10/27/2008 12:49)       Diabetic Eye Exam  Procedure date:  10/27/2008  Findings:      No diabetic retinopathy.     Procedures Next Due Date:    Diabetic Eye Exam: 11/2009   Diabetic Eye Exam  Procedure date:  10/27/2008  Findings:      No diabetic retinopathy.     Procedures Next Due Date:    Diabetic Eye Exam: 11/2009

## 2010-06-04 NOTE — Progress Notes (Signed)
Summary: refill/ hla  Phone Note Refill Request Message from:  Fax from Pharmacy on July 30, 2009 10:18 AM  Refills Requested: Medication #1:  LISINOPRIL 40 MG TABS Take 1 tablet by mouth once a day   Last Refilled: 2/26 Initial call taken by: Marin Roberts RN,  July 30, 2009 10:18 AM  Follow-up for Phone Call        Rx faxed to pharmacy Follow-up by: [name],  July 31, 2009 6:53 AM    Prescriptions: LISINOPRIL 40 MG TABS (LISINOPRIL) Take 1 tablet by mouth once a day  #30 x 6   Entered and Authorized by:   [name]   Signed by:   [name] on 07/31/2009   Method used:   Electronically to        [facility] [phone]* (retail)       [address]       [place], [place]  [address]       Ph: [phone]       Fax: [phone]   RxID:   [id number]

## 2010-06-04 NOTE — Assessment & Plan Note (Signed)
Summary: CHECKUP/ SB.   Vital Signs:  Patient Profile:   65 Years Old Female Height:     59 inches (149.86 cm) Weight:      182.1 pounds (82.77 kg) BMI:     36.91 Temp:     97.1 degrees F (36.17 degrees C) oral Pulse rate:   67 / minute BP sitting:   137 / 75  (right arm)  Pt. in pain?   no  Vitals Entered By: Theotis Barrio (March 25, 2007 10:26 AM)              Is Patient Diabetic? Yes  CBG Result 127  Does patient need assistance? Functional Status Self care Ambulation Normal     Chief Complaint:   ROUTINE FOLLOW-UP/ WANTS FLU SHOT.  History of Present Illness: 65 yo woman with Allergic Rhinitis, Normo Anemia NOS, DM type II, HL, HTN, NAFLD with regenerating nodules (2/2 Obesity/DM seen by Dr. Lavonia Drafts), Obesity (BMI 35), Sleep disturbances (PLS, Bruxism, Breathing d/o- 5/05 sleep study), recurrent vaginal candidiasis, Right renal lesion (seen 2/06), h/o multiple skin tags, and Int/Ext Hemorrhoids (Colonoscopy, Dr. Loreta Ave- 8/06) here for routine f/u visit.   She wants a flu shot today.    She has started a new exercise program where she uses her [employer] which is outside in her laundry room (in the cold). Her goal is to use it for at least 20 minutes/ 5 days a week. She still drinks sugar drinks occassionally. She eats sweets/chocolate for snacks. She is interested in knowing what can be done for sustained weight loss. Interestingly, she mentioned that she feels like she has no energy. She went to the [employer] [facility], and purchased some MVI tablets in hopes that it will help.    Prior Medications :  ATENOLOL 50 MG TABS (ATENOLOL) Take 1 tablet by mouth once a day MAXZIDE-25 37.5-25 MG TABS (TRIAMTERENE-HCTZ) Take 1 tablet by mouth once a day LOVASTATIN 40 MG TABS (LOVASTATIN) Take 1 tablet by mouth once a day ZYRTEC 10 MG TABS (CETIRIZINE HCL) Take 1 tablet by mouth once a day GLIPIZIDE 10 MG  TABS (GLIPIZIDE) Take 2 tablets once  daily ASPIRIN 81 MG TBEC (ASPIRIN) Take 1 tablet by mouth once a day MELOXICAM 15 MG TABS (MELOXICAM) as directed as needed for arthritis pain. METFORMIN HCL 500 MG TABS (METFORMIN HCL) Take 1 tablet by mouth two times a day    Current Allergies (reviewed today): ! GLUCOPHAGE Updated/Current Medications (including changes made in today's visit):  ATENOLOL 50 MG TABS (ATENOLOL) Take 1 tablet by mouth once a day MAXZIDE-25 37.5-25 MG TABS (TRIAMTERENE-HCTZ) Take 1 tablet by mouth once a day LOVASTATIN 40 MG TABS (LOVASTATIN) Take 1 tablet by mouth once a day ZYRTEC 10 MG TABS (CETIRIZINE HCL) Take 1 tablet by mouth once a day GLIPIZIDE 10 MG  TABS (GLIPIZIDE) Take 2 tablets once daily ASPIRIN 81 MG TBEC (ASPIRIN) Take 1 tablet by mouth once a day MELOXICAM 15 MG TABS (MELOXICAM) as directed as needed for arthritis pain. METFORMIN HCL 500 MG  TABS (METFORMIN HCL) Take 1 tablet by mouth two times a day   Past Medical History:    Chest Pain 12/07    - Cardiology inpatient referral, Dr. [facility]    - Negative Tc-99 Myoview, EF 70% 04/14/06     - Believed to be 2/2 GI as relieved by PPI    GERD vs Gastritis (12/07)    Allergic rhinitis    Anemia-NOS-normocytic.  no further w/u done.    Diabetes mellitus, type II    Hyperlipidemia    Hypertension - dx 1995    nonalcoholic fatty liver disease 2/2 wt and DM- Abd US1/06, MRI2/06    -  5 liver lesions from fatty infiltrate and likely regenerationg nodules CT 2/06; MRI 2/06, F/U W/ CONTRAST CT    -  Mann to f.u?; nl alpha-fetoprotein 2/06    hx of hyperkalemia, further details not known.    obesity-BMI 35.5 (4/06)    renal lesion- right lower pole - 2/06    recurrent vaginal Candidiasis    Sleep disturbances    -  periodic limb movement- sleep study 5/05    -  Bruxism (grinding teeth)-sleep study 5/05    -  occasional sleep disorder breathing with RDI 2.2    skin tags external auditory canal, S/P removal 4/06     hemorrhoids, small, internal and external. no polyps-Colonoscopy, Dr. Loreta Ave- 8/06;9/00    Otitis Media- bilateral 2/03    RUQ abd pain- neg HIDA 1/06    Osteoarthritis, bilateral knee (see PSH)    - R knee arthroscopy    - L knee moderative tricompartmental disease (4 view X-ray 9/07)   Past Surgical History:    Hysterectomy - TAH/BSO - 1999, reason unknown to me. no records.    Knee arthroscopy, Right 2000    Tubal ligation, bilateral   Family History:    Father died of stroke in his 29's    Sister with MI at age 59    Family History of CAD Female 1st degree relative <60    Family History of Stroke M 1st degree relative <50  Social History:    Never Smoked    Alcohol use-no    Drug use-no    Married and lives with her husband.    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    Ethnicity:  Black    Occupation:  Clinical biochemist    Marital Status:  Married    Sex Orientation:  Heterosexual    Religion:  Christian Protestant    Alcohol:  None    Tobacco Use:  Never    Transportation:  Contractor   Risk Factors:  Tobacco use:  never Drug use:  no Alcohol use:  no Exercise:  no Seatbelt use:  100 %    Physical Exam  General:     alert, well-developed, well-nourished, and well-hydrated.   Lungs:     normal respiratory effort and normal breath sounds.   Heart:     Slow normal rate, regular rhythm with occassionaly ectopic beats, and no murmur.   Extremities:     trace left pedal edema.  trace left pedal edema and trace right pedal edema.      Impression & Recommendations:  Problem # 1:  OBESITY NOS (ICD-278.00) Assessment: Deteriorated She plans on exercise program with daughter and at home. At this point, she has a cross country, elliptical machine outside in her laundry room. She states that the cold weather is preventing her from using it regularly. She has had it in the house  before, but states that her house is small and that it is difficult for her to place the machine in her kitchen because she has to keep walking around it in order to cook. She wants to go to the [employer] with her daughter using her pass, but  can only go when her daughter is there. I encouraged her to just do as much as she can at home if she cannot get to the gym with her daughter. There schedules are difficult to coordinate. I encouraged her to again stop her sugar drinks. She likes sodas, and we talked about using Diet Sunkist as an option to the sugar drinks (I informed her that Gingerale is, indeed, a sugar drink). We also talked about using frequent, small, healthy snacks during the day (Fruits/vegetables/ fat-free rice cakes) to help curbside hunger pangs. A handout was given on weight loss. The goal is 1-2 lb per week. I will see her in 3 mos. The goal at that time is 10 lbs less. She seems pretty motivated to lose weight.    Problem # 2:  HYPERTENSION (ICD-401.9) Assessment: Deteriorated Her BP is a little higher today. I did not make any changes to her medicines at this time, as her last measurement was great. Continue Maxzide and Atenolol at the same dose. Her HR was 61 today, and she had some ectopic beats. She had a low normal Heartrate which is likely related to her Atenolol. A medicine that is lacking from her regimen is an ACE INHIBITOR given her diabetes. I do not have it listed that she has an allergy. I think that it was never added as her dx of DM is relatively new and her BP has been so well controlled. I would have a very low threshold for starting this medicine, especially if her BP is high on the next visit. I will also obtain a microalbumin/albumin ratio in the interim.  Her updated medication list for this problem includes:    Atenolol 50 Mg Tabs (Atenolol) .Marland Kitchen... Take 1 tablet by mouth once a day    Maxzide-25 37.5-25 Mg Tabs (Triamterene-hctz) .Marland Kitchen... Take 1 tablet by mouth once a  day  BP today: 137/75 Prior BP: 119/73 (01/21/2007)  Prior 10 Yr Risk Heart Disease: Not enough information (07/09/2006)  Labs Reviewed: Creat: 0.91 (12/02/2006) Chol: 142 (12/02/2006)   HDL: 42 (12/02/2006)   LDL: 79 (12/02/2006)   TG: 104 (12/02/2006)   Problem # 3:  DIABETES MELLITUS, TYPE II (ICD-250.00) Assessment: Improved We will check her HgbA1c and CBG today. Continue current treatment for now. We doubled her Glipizide when her A1c was 8.9 back on 12/02/06. Today it is down to 7.9 which is better. She has not tolerated the 850 mg tablet of Metformin in the past. She had diarrhea and had to go back down to 500 mg which she tolerates. I discussed this with her today. We have room for both her Glipizide and Metformin to be increased. The max dose of Glipizide is 40 mg daily, so we can increase to two times a day on the next visit. She has not been checking her sugars regularly, and states that she lost her glucometer. I do not think it is necessary to check sugars daily while on oral therapy, but I do want her to be able to check her sugars periodically and if she is symptomatic. For this reason, I am not going to make any changes to her medicines today. She will look for her meter at home and the next visit, we can either double the Glipizide or re-challenge the higher dose of Glucophage. She will also work on weight loss as per above. I do not think she will need insulin anytime soon if we can max her oral meds.  Obtain M/C ratio  next visit. Her updated medication list for this problem includes:    Glipizide 10 Mg Tabs (Glipizide) .Marland Kitchen... Take 2 tablets once daily    Aspirin 81 Mg Tbec (Aspirin) .Marland Kitchen... Take 1 tablet by mouth once a day    Metformin Hcl 500 Mg Tabs (Metformin hcl) .Marland Kitchen... Take 1 tablet by mouth two times a day  Orders: T- Capillary Blood Glucose (04540) T-Hgb A1C (in-house) ([id number])  Future Orders: T-Urine Microalbumin w/creat. ratio [id number] / [id number]) ...  04/01/2007  Labs Reviewed: HgBA1c: 8.9 (12/02/2006)   Creat: 0.91 (12/02/2006)     Her updated medication list for this problem includes:    Glipizide 10 Mg Tabs (Glipizide) .Marland Kitchen... Take 2 tablets once daily    Aspirin 81 Mg Tbec (Aspirin) .Marland Kitchen... Take 1 tablet by mouth once a day    Metformin Hcl 500 Mg Tabs (Metformin hcl) .Marland Kitchen... Take 1 tablet by mouth two times a day   Problem # 4:  HYPERLIPIDEMIA (ICD-272.4) Assessment: Unchanged Her LDL was 79 on 12/02/06 which is well controlled. Her other cholesterol measures were also in good shape. No changes to her Lovastatin at this time.  Her updated medication list for this problem includes:    Lovastatin 40 Mg Tabs (Lovastatin) .Marland Kitchen... Take 1 tablet by mouth once a day  Labs Reviewed: Chol: 142 (12/02/2006)   HDL: 42 (12/02/2006)   LDL: 79 (12/02/2006)   TG: 104 (12/02/2006) SGOT: 16 (12/02/2006)   SGPT: 19 (12/02/2006)  Prior 10 Yr Risk Heart Disease: Not enough information (07/09/2006)   Problem # 5:  Preventive Health Care (ICD-V70.0) Assessment: Comment Only Flu Shot was given today.   Problem # 6:  FATTY LIVER DISEASE (ICD-571.8) Assessment: Comment Only She had an abdominal MRI in 06/2004, and was seen by Dr. Loreta Ave. There were nodules that were believed to be regenerating at that time. The radiologist mentioned that she needed some f/u in several months. She has not had a f/u scan, so I will go ahead and order it today. Otherwise, we will continue to aggressively manage her DM, HL, and obesity.  Future Orders: CT with Contrast (CT w/ contrast) ... 03/26/2007   Problem # 7:  ANEMIA-NOS (ICD-285.9) Assessment: Comment Only We have not obtained a CBC in a while. The last recorded CBC was 12/07 and showed a hgb of 11.3, with a MCV of 91, and a RDW of 16. We can get a CBC when she comes back in for labs and CT scans.   Problem # 8:  ABNORMAL THYROID FUNCTION TESTS (ICD-794.5) Assessment: New Today, Mrs. Madding stated that she has been  having symptoms of fatigue and lack of energy. I reviewed all of her labs, and she had an abnormal TSH of 6.674 on April 14, 2006. I think that it is worth checking another TSH to see if this is still elevated, as this may explain some of her symptoms. If it is normal, then we can continue exercise, MVI use, and lifestyle changes and see if it improves.  Future Orders: T-TSH ([id number]) ... 03/26/2007   Complete Medication List: 1)  Atenolol 50 Mg Tabs (Atenolol) .... Take 1 tablet by mouth once a day 2)  Maxzide-25 37.5-25 Mg Tabs (Triamterene-hctz) .... Take 1 tablet by mouth once a day 3)  Lovastatin 40 Mg Tabs (Lovastatin) .... Take 1 tablet by mouth once a day 4)  Zyrtec 10 Mg Tabs (Cetirizine hcl) .... Take 1 tablet by mouth once a day 5)  Glipizide 10 Mg Tabs (Glipizide) .Marland KitchenMarland KitchenMarland Kitchen  Take 2 tablets once daily 6)  Aspirin 81 Mg Tbec (Aspirin) .... Take 1 tablet by mouth once a day 7)  Meloxicam 15 Mg Tabs (Meloxicam) .... As directed as needed for arthritis pain. 8)  Metformin Hcl 500 Mg Tabs (Metformin hcl) .... Take 1 tablet by mouth two times a day  Other Orders: Influenza Vaccine NON MCR ([id number])   Patient Instructions: 1)  Please schedule a follow-up appointment in 3 months.  2)  Keep up the good work on trying to exercise regularly at least 20 minutes 5 times a week! Make it a part of your regular daily routine. If you develop chest pain, have severe difficulty breathing, or feel very tired , stop exercising immediately and seek medical attention. 3)  Consider a lower calorie diet. Try frequent, healthy snack (fruits, vegetables, fat-free rice cakes) to help keep your appetite down. Choose water or calorie-free drinks instead of soda or sweetened drinks. (See handout). 4)  Your goal should be about 1-2 lbs a week. This means that in 3 months, you can lose about 10 lbs.  5)  Your HgbA1c is down to 7.9 from 8.9! This is much better. Our goal is to get it down to 7.0. Increase your  Metformin to 1000 mg two times a day according to your handout.     Prescriptions: METFORMIN HCL 1000 MG  TABS (METFORMIN HCL) Take 1 tablet by mouth two times a day for diabetes  #31 x 6   Entered and Authorized by:   Eliseo Gum MD   Signed by:   Eliseo Gum MD on 03/25/2007   Method used:   Electronically sent to ...       [address]       [address]       [place], [place]  [address]       Ph: [phone]       Fax: [phone]   RxID:   [id number]  ] Laboratory Results   Blood Tests   Date/Time Recieved: March 25, 2007 11:36 AM Date/Time Reported: ..................................................................Marland KitchenAlric Quan  March 25, 2007 11:36 AM   HGBA1C: 7.9%   (Normal Range: Non-Diabetic - 3-6%   Control Diabetic - 6-8%) CBG Random: 127      Influenza Vaccine    Vaccine Type: Fluvax Non-MCR    Site: right deltoid    Mfr: norvartis    Dose: 0.5 ml    Route: IM    Given by: Stanton Kidney Ditzler RN    Exp. Date: 11/02/2007    Lot #: [id number]    VIS given: 11/26/06 version given March 25, 2007.  Flu Vaccine Consent Questions    Do you have a history of severe allergic reactions to this vaccine? no    Any prior history of allergic reactions to egg and/or gelatin? no    Do you have a sensitivity to the preservative Thimersol? no    Do you have a past history of Guillan-Barre Syndrome? no    Do you currently have an acute febrile illness? no    Have you ever had a severe reaction to latex? no    Vaccine information given and explained to patient? yes    Are you currently pregnant? no

## 2010-06-04 NOTE — Progress Notes (Signed)
Summary: diabetes support/dmr  Phone Note Outgoing Call   Call placed by: Jamison Neighbor RD,CDE,  March 23, 2009 11:32 AM Summary of Call: called to follow-up on blood sugars and to inform patient about [facility] Diabetes program that is no charge. Left message and mailed her information.

## 2010-06-04 NOTE — Progress Notes (Signed)
Summary: DSMT standing order f/u  Phone Note Outgoing Call   Call placed by: Jamison Neighbor,  June 10, 2006 11:48 AM Summary of Call: Left two messages for patient to call to schedule Diabetes Sel-Management Training per standing order received 04-13-06 that was never scheduled. Called and left messages on 05-20-06 and 05-28-06.

## 2010-06-04 NOTE — Assessment & Plan Note (Signed)
Summary: [name]/(ALVAREZ) SB.   Vital Signs:  Patient profile:   65 year old female Height:      59 inches (149.86 cm) Weight:      164.75 pounds (74.89 kg) BMI:     33.40 Temp:     98.9 degrees F (37.17 degrees C) oral Pulse rate:   100 / minute BP sitting:   107 / 70  (right arm)  Vitals Entered By: Stanton Kidney Ditzler RN (January 23, 2010 2:58 PM) Is Patient Diabetic? Yes Did you bring your meter with you today? No Pain Assessment Patient in pain? no      Nutritional Status BMI of > 30 = obese Nutritional Status Detail appetite good CBG Result 294  Have you ever been in a relationship where you felt threatened, hurt or afraid?denies   Does patient need assistance? Functional Status Self care Ambulation Normal Comments [name] - feeling better.   Primary Care Provider:  [name]   History of Present Illness: Pt is a 65 y/o F presenting for [name] from admission from 9/6-9/9 for E. Coli (amp resistant) sepsis likely from urinary source.  She was treated with broad spectrum IV abx during her hospitalization; these were ultimatly narrowed to Cipro; pt sent home to complete an additional 7 days of oral therapy.  Blood cx drawn on the day prior to d/c are finalized with 2/2 negative for bacterial growth.  Pt completed home course of Cipro.  Pt is feeling better since d/c home.  Deneis fevers, chills, rigors, dysuria, flank pain, h/a, c/p, sob, syncope, or doe. She admits to occasional indigestion, particularly when laying down after eating.  She does not take any rx or otc products for GERD.  Pt denies feeling depressed.   She states she is attempting to reapply for medicare/medicaid.    She has no other concerns/complaints today.    Depression History:      The patient denies a depressed mood most of the day and a diminished interest in her usual daily activities.         Preventive Screening-Counseling & Management  Alcohol-Tobacco     Smoking Status:  never  Caffeine-Diet-Exercise     Does Patient Exercise: no  Current Medications (verified): 1)  Pravachol 40 Mg Tabs (Pravastatin Sodium) .... Take 1 Tablet By Mouth Once A Day 2)  Zyrtec 10 Mg Tabs (Cetirizine Hcl) .... Take 1 Tablet By Mouth Once A Day 3)  Glipizide 10 Mg  Tabs (Glipizide) .... Take 2 Tablets Once Daily 4)  Aspirin 81 Mg Tbec (Aspirin) .... Take 1 Tablet By Mouth Once A Day 5)  Metformin Hcl 850 Mg Tabs (Metformin Hcl) .... Take 1 Tablet By Mouth Two Times A Day 6)  Hydrochlorothiazide 25 Mg Tabs (Hydrochlorothiazide) .... Stop Taking Your Medication Until Your Next Appointment 7)  Lisinopril 40 Mg Tabs (Lisinopril) .... Take 1 Tablet By Mouth Once A Day 8)  Coreg 3.125 Mg Tabs (Carvedilol) .... Take 1 Tablet By Mouth Two Times A Day 9)  Miconazole Nitrate 2 % Aerp (Miconazole Nitrate) .... Apply 2 Times Per Day  Allergies: 1)  ! Glucophage  Past History:  Past medical, surgical, family and social histories (including risk factors) reviewed for relevance to current acute and chronic problems.  Past Medical History: Reviewed history from 03/25/2007 and no changes required. Chest Pain 12/07 - Cardiology inpatient referral, Dr. Aub[facility] - Negative Tc-99 Myoview, EF 70% 04/14/06  - Believed to be 2/2 GI as relieved by PPI GERD  vs Gastritis (12/07) Allergic rhinitis Anemia-NOS-normocytic. no further w/u done. Diabetes mellitus, type II Hyperlipidemia Hypertension - dx 1995 nonalcoholic fatty liver disease 2/2 wt and DM- Abd US1/06, MRI2/06 -  5 liver lesions from fatty infiltrate and likely regenerationg nodules CT 2/06; MRI 2/06, F/U W/ CONTRAST CT -  Mann to f.u?; nl alpha-fetoprotein 2/06 hx of hyperkalemia, further details not known. obesity-BMI 35.5 (4/06) renal lesion- right lower pole - 2/06 recurrent vaginal Candidiasis Sleep disturbances -  periodic limb movement- sleep study 5/05 -  Bruxism (grinding teeth)-sleep study 5/05 -   occasional sleep disorder breathing with RDI 2.2 skin tags external auditory canal, S/P removal 4/06 hemorrhoids, small, internal and external. no polyps-Colonoscopy, Dr. Loreta Ave- 8/06;9/00 Otitis Media- bilateral 2/03 RUQ abd pain- neg HIDA 1/06 Osteoarthritis, bilateral knee (see PSH) - R knee arthroscopy - L knee moderative tricompartmental disease (4 view X-ray 9/07)   Past Surgical History: Reviewed history from 03/25/2007 and no changes required. Hysterectomy - TAH/BSO - 1999, reason unknown to me. no records. Knee arthroscopy, Right 2000 Tubal ligation, bilateral  Family History: Reviewed history from 03/25/2007 and no changes required. Father died of stroke in his 23's Sister with MI at age 60 Family History of CAD Female 1st degree relative <60 Family History of Stroke M 1st degree relative <50  Social History: Reviewed history from 03/25/2007 and no changes required. Never Smoked Alcohol use-no Drug use-no Married and lives with her husband. 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  Physical Exam  General:  Well-developed,well-nourished,in no acute distress; alert,appropriate and cooperative throughout examination Head:  normocephalic and atraumatic.   Eyes:  vision grossly intact, pupils equal, pupils round, and pupils reactive to light.  anicteric Mouth:  pharynx pink and moist, no erythema, and no exudates.   Neck:  supple, full ROM, and no masses.   Lungs:  Normal respiratory effort, chest expands symmetrically. Lungs are clear to auscultation, no crackles or wheezes. Heart:  Normal rate and regular rhythm. S1 and S2 normal without gallop, murmur, click, rub or other extra sounds. Abdomen:  soft and normal bowel sounds.  non-tender, no distention, no masses, and no guarding.    No CVA tenderness. Extremities:  No clubbing,cyanosis, or edmea. Neurologic:  No cranial nerve deficits  noted. alert & oriented X3.  No focal deficits.  Skin:  turgor normal, color normal, no rashes, and no suspicious lesions.   Psych:  Cognition and judgment appear intact. Alert and cooperative with normal attention span and concentration. No apparent delusions, illusions, hallucinations  Diabetes Management Exam:    Foot Exam (with socks and/or shoes not present):       Sensory-Monofilament:          Left foot: normal          Right foot: normal   Impression & Recommendations:  Problem # 1:  Hosp for UTI (ICD-599.0) Pt is without signs,history, or physical exam findings concerning for systemic infection or recurrent uti.  Orders: T-CMP with Estimated GFR ([id number])  Problem # 2:  HYPERTENSION (ICD-401.9) BP slightly low today, but c/w measurements during her hospitalization.  WIll hold HCTZ and bring pt back in 1-2 weeks for BP recheck.  Will check CMET today.  Her updated medication list for this problem includes:    Hydrochlorothiazide 25 Mg Tabs (Hydrochlorothiazide) ..... Stop taking your medication until your next appointment    Lisinopril 40 Mg Tabs (Lisinopril) .Marland Kitchen... Take  1 tablet by mouth once a day    Coreg 3.125 Mg Tabs (Carvedilol) .Marland Kitchen... Take 1 tablet by mouth two times a day  Orders: T-CMP with Estimated GFR ([id number])  Problem # 3:  INADEQUATE MATERIAL RESOURCES (ICD-V60.2) Will refer pt to [facility] for assistance with medicaid/medicare application,food stamps,and any other avaible financial assistance pt may qualify for.  Orders: Social Work Referral (Social )  Problem # 4:  PREVENTIVE HEALTH CARE (ICD-V70.0) Will give pt pneumovax and annual flu vax today.  Complete Medication List: 1)  Pravachol 40 Mg Tabs (Pravastatin sodium) .... Take 1 tablet by mouth once a day 2)  Zyrtec 10 Mg Tabs (Cetirizine hcl) .... Take 1 tablet by mouth once a day 3)  Glipizide 10 Mg Tabs (Glipizide) .... Take 2 tablets once daily 4)  Aspirin 81 Mg Tbec (Aspirin) ....  Take 1 tablet by mouth once a day 5)  Metformin Hcl 850 Mg Tabs (Metformin hcl) .... Take 1 tablet by mouth two times a day 6)  Hydrochlorothiazide 25 Mg Tabs (Hydrochlorothiazide) .... Stop taking your medication until your next appointment 7)  Lisinopril 40 Mg Tabs (Lisinopril) .... Take 1 tablet by mouth once a day 8)  Coreg 3.125 Mg Tabs (Carvedilol) .... Take 1 tablet by mouth two times a day 9)  Miconazole Nitrate 2 % Aerp (Miconazole nitrate) .... Apply 2 times per day  Other Orders: Capillary Blood Glucose/CBG (91478) Influenza Vaccine NON MCR (29562) Pneumococcal Vaccine (13086) Admin 1st Vaccine (57846)  Patient Instructions: 1)  Please schedule a follow-up appointment in 1-2 weeks for a blood pressure checks. 2)  STOP taking HCTZ until your next appointment. 3)  Keep taking all of your other medications. 4)  I will call you if any of your lab work is abnormal. Process Orders Check Orders Results:     [facility]: ABN not required for this insurance Tests Sent for requisitioning (January 24, 2010 8:48 AM):     01/23/2010: [facility] -- T-CMP with Estimated GFR [[id number]] (signed)     Prevention & Chronic Care Immunizations   Influenza vaccine: Fluvax Non-MCR  (01/23/2010)   Influenza vaccine deferral: Not indicated  (08/14/2009)    Tetanus booster: 12/29/2008: Td    Pneumococcal vaccine: Pneumovax  (01/23/2010)    H. zoster vaccine: Not documented   H. zoster vaccine deferral: Not indicated  (08/14/2009)  Colorectal Screening   Hemoccult: Not documented   Hemoccult action/deferral: Not indicated  (08/14/2009)    Colonoscopy: Results: Polyp.  Awaiting result   (10/24/2009)   Colonoscopy action/deferral: Further recommendations pending biopsy results.    (10/24/2009)   Colonoscopy due: 10/2009  Other Screening   Pap smear: Not documented   Pap smear action/deferral: Not indicated S/P hysterectomy  (07/03/2009)     Mammogram: ASSESSMENT: Negative - BI-RADS 1^MS DIGITAL SCREENING  (08/30/2009)   Mammogram action/deferral: Screening mammogram in 1 year.     (08/26/2007)   Mammogram due: 08/20/2009    DXA bone density scan: Not documented   Smoking status: never  (01/23/2010)  Diabetes Mellitus   HgbA1C: 8.1 in hospital  (01/08/2010)    Eye exam: No diabetic retinopathy.     (10/27/2008)   Eye exam due: 11/2009    Foot exam: yes  (01/23/2010)   Foot exam action/deferral: Do today   High risk foot: Not documented   Foot care education: Done  (08/14/2009)    Urine microalbumin/creatinine ratio: 11.0  (10/04/2008)  Lipids   Total Cholesterol: 144  (  08/14/2009)   Lipid panel action/deferral: Lipid Panel ordered   LDL: 83  (08/14/2009)   LDL Direct: Not documented   HDL: 36  (08/14/2009)   Triglycerides: 124  (08/14/2009)    SGOT (AST): 17  (01/04/2008)   BMP action: Ordered   SGPT (ALT): 25  (01/04/2008)   Alkaline phosphatase: 47  (01/04/2008)   Total bilirubin: 0.5  (01/04/2008)  Hypertension   Last Blood Pressure: 107 / 70  (01/23/2010)   Serum creatinine: 0.91  (10/04/2008)   BMP action: Ordered   Serum potassium 4.1  (10/04/2008)  Self-Management Support :   Personal Goals (by the next clinic visit) :     Personal A1C goal: 7  (07/03/2009)     Personal blood pressure goal: 130/80  (07/03/2009)     Personal LDL goal: 100  (07/03/2009)    Patient will work on the following items until the next clinic visit to reach self-care goals:     Medications and monitoring: take my medicines every day, check my blood sugar, check my blood pressure, bring all of my medications to every visit, weigh myself weekly, examine my feet every day  (01/23/2010)     Eating: eat more vegetables, use fresh or frozen vegetables, eat foods that are low in salt, eat fruit for snacks and desserts, limit or avoid alcohol  (01/23/2010)     Activity: take a 30 minute walk every day  (01/23/2010)    Diabetes  self-management support: Written self-care plan, Education handout, Resources for patients handout  (01/23/2010)   Diabetes care plan printed   Diabetes education handout printed   Last diabetes self-management training by diabetes educator: 07/13/2009    Hypertension self-management support: Written self-care plan, Education handout, Resources for patients handout  (01/23/2010)   Hypertension self-care plan printed.   Hypertension education handout printed    Lipid self-management support: Written self-care plan, Education handout, Resources for patients handout  (01/23/2010)   Lipid self-care plan printed.   Lipid education handout printed      Resource handout printed.   Nursing Instructions: Give Flu vaccine today Give Pneumovax today   Last LDL:                                                 83 (08/14/2009 6:34:00 PM)            Diabetic Foot Exam Foot Inspection Is there a history of a foot ulcer?              No Is there a foot ulcer now?              No Can the patient see the bottom of their feet?          Yes Are the shoes appropriate in style and fit?          Yes Is there swelling or an abnormal foot shape?          No Are the toenails long?                No Are the toenails thick?                No Are the toenails ingrown?              No Is there heavy callous build-up?  No Is there a claw toe deformity?                          No Is there elevated skin temperature?            No Is there limited ankle dorsiflexion?            No Is there foot or ankle muscle weakness?            No Do you have pain in calf while walking?           No         10-g (5.07) Semmes-Weinstein Monofilament Test Performed by: Stanton Kidney Ditzler RN          Right Foot          Left Foot Visual Inspection     normal         normal Test Control      normal         normal Site 1         normal         normal Site 2         normal         normal Site 3          normal         normal Site 4         normal         normal Site 5         normal         normal Site 6         normal         normal Site 7         normal         normal Site 8         normal         normal Site 9         normal         normal Site 10         normal         normal  Impression      normal         normal    Pneumovax Vaccine    Vaccine Type: Pneumovax    Site: right deltoid    Mfr: Merck    Dose: 0.5 ml    Route: IM    Given by: Stanton Kidney Ditzler RN    Exp. Date: 07/18/2011    Lot #: [id number]    VIS given: 04/09/09 version given January 23, 2010.  Influenza Vaccine    Vaccine Type: Fluvax Non-MCR    Site: left deltoid    Mfr: GlaxoSmithKline    Dose: 0.5 ml    Route: IM    Given by: Stanton Kidney Ditzler RN    Exp. Date: 11/02/2010    Lot #: [id number]    VIS given: 11/27/09 version given January 23, 2010.  Flu Vaccine Consent Questions    Do you have a history of severe allergic reactions to this vaccine? no    Any prior history of allergic reactions to egg and/or gelatin? no    Do you have a sensitivity to the preservative Thimersol? no    Do you have a past history of Guillan-Barre Syndrome? no    Do you currently have an acute febrile illness? no    Have  you ever had a severe reaction to latex? no    Vaccine information given and explained to patient? yes    Are you currently pregnant? no

## 2010-06-04 NOTE — Progress Notes (Signed)
Summary: Refill/gh  Phone Note Refill Request Message from:  Pharmacy on June 12, 2008 11:22 AM  Needs refill on Lisino-HCTZ 20-25 mg tablets 1 by mouth daily last refill was 04/25/08   Method Requested: Electronic Initial call taken by: Angelina Ok RN,  June 12, 2008 11:23 AM  Follow-up for Phone Call        Lisinopril refilled.  HCTZ was refilled 9/09 with refills as needed. Follow-up by: [name],  June 15, 2008 6:13 AM      Prescriptions: LISINOPRIL 40 MG TABS (LISINOPRIL) Take 1 tablet by mouth once a day  #30 x 3   Entered and Authorized by:   [name]   Signed by:   [name] on 06/15/2008   Method used:   Electronically to        [facility] [phone]* (retail)       [address]       [place], [place]  [address]       Ph: [phone]       Fax: [phone]   RxID:   [id number]

## 2010-06-04 NOTE — Assessment & Plan Note (Signed)
Summary: checkup [mkj]   Vital Signs:  Patient profile:   65 year old female Height:      59 inches (149.86 cm) Weight:      173.01 pounds (78.64 kg) BMI:     35.07 Temp:     98.5 degrees F (36.94 degrees C) oral Pulse rate:   106 / minute BP sitting:   141 / 81  (right arm)  Vitals Entered By: Angelina Ok RN (March 08, 2009 10:20 AM)  Serial Vital Signs/Assessments:  Time      Position  BP       Pulse  Resp  Temp     By                     132/82                         [name]  Is Patient Diabetic? Yes Did you bring your meter with you today? No Pain Assessment Patient in pain? no      Nutritional Status BMI of > 30 = obese  Have you ever been in a relationship where you felt threatened, hurt or afraid?No   Does patient need assistance? Functional Status Self care Ambulation Normal Comments Follow up   Primary Care Provider:  Eliseo Gum MD   History of Present Illness: Mrs Cosma is a 65 yo woman with pMH as outlined below.  She is here for routine follow up.  She reports some pain in both ears.  Waxes and wanes.  Sometimes one, then the other.  Pain is described as being inside.  Reports using zyrtec but not sure if it is helping.   Preventive Screening-Counseling & Management  Alcohol-Tobacco     Smoking Status: never  Medications Prior to Update: 1)  Pravachol 40 Mg Tabs (Pravastatin Sodium) .... Take 1 Tablet By Mouth Once A Day 2)  Zyrtec 10 Mg Tabs (Cetirizine Hcl) .... Take 1 Tablet By Mouth Once A Day 3)  Glipizide 10 Mg  Tabs (Glipizide) .... Take 2 Tablets Once Daily 4)  Aspirin 81 Mg Tbec (Aspirin) .... Take 1 Tablet By Mouth Once A Day 5)  Metformin Hcl 500 Mg  Tabs (Metformin Hcl) .... Take 1 Tablet By Mouth Two Times A Day 6)  Constulose 10 Gm/32ml  Soln (Lactulose) .... Take 15 Ml Twice A Day For Constipation As Needed. 7)  Hydrochlorothiazide 25 Mg Tabs (Hydrochlorothiazide) .... Take 1 Tablet By Mouth Once A Day 8)   Lisinopril 40 Mg Tabs (Lisinopril) .... Take 1 Tablet By Mouth Once A Day  Allergies (verified): 1)  ! Glucophage  Past History:  Past Medical History: Last updated: 03/25/2007 Chest Pain 12/07 - Cardiology inpatient referral, Dr. Aubery [facility] - Negative Tc-99 Myoview, EF 70% 04/14/06  - Believed to be 2/2 GI as relieved by PPI GERD vs Gastritis (12/07) Allergic rhinitis Anemia-NOS-normocytic. no further w/u done. Diabetes mellitus, type II Hyperlipidemia Hypertension - dx 1995 nonalcoholic fatty liver disease 2/2 wt and DM- Abd US1/06, MRI2/06 -  5 liver lesions from fatty infiltrate and likely regenerationg nodules CT 2/06; MRI 2/06, F/U W/ CONTRAST CT -  Mann to f.u?; nl alpha-fetoprotein 2/06 hx of hyperkalemia, further details not known. obesity-BMI 35.5 (4/06) renal lesion- right lower pole - 2/06 recurrent vaginal Candidiasis Sleep disturbances -  periodic limb movement- sleep study 5/05 -  Bruxism (grinding teeth)-sleep study 5/05 -  occasional sleep disorder breathing with  RDI 2.2 skin tags external auditory canal, S/P removal 4/06 hemorrhoids, small, internal and external. no polyps-Colonoscopy, Dr. Loreta Ave- 8/06;9/00 Otitis Media- bilateral 2/03 RUQ abd pain- neg HIDA 1/06 Osteoarthritis, bilateral knee (see PSH) - R knee arthroscopy - L knee moderative tricompartmental disease (4 view X-ray 9/07)   Past Surgical History: Last updated: 04-22-07 Hysterectomy - TAH/BSO - 1999, reason unknown to me. no records. Knee arthroscopy, Right 2000 Tubal ligation, bilateral  Family History: Last updated: 04/22/2007 Father died of stroke in his 62's Sister with MI at age 38 Family History of CAD Female 1st degree relative <60 Family History of Stroke M 1st degree relative <50  Social History: Last updated: 2007/04/22 Never Smoked Alcohol use-no Drug use-no Married and lives with her husband. 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  Risk Factors: Smoking Status: never (03/08/2009)  Review of Systems      See HPI  Physical Exam  General:  alert, cooperative to examination, and overweight-appearing.   Ears:  Minimal cerumen and debris in bilateral canals.  Mild erythema noted, but no bulging or purulence.  ? clear fluid behind L TM. Lungs:  normal respiratory effort, no accessory muscle use, normal breath sounds, no crackles, and no wheezes.   Heart:  normal rate, regular rhythm, no gallop, no rub, and no JVD.  Grade 1-2/6 systolic murmur at base Abdomen:  soft and normal bowel sounds.   Extremities:  +1 bilateral edema Neurologic:  alert & oriented X3, cranial nerves II-XII intact, strength normal in all extremities, and gait normal.   Psych:  Oriented X3, memory intact for recent and remote, normally interactive, good eye contact, not anxious appearing, and not depressed appearing.     Impression & Recommendations:  Problem # 1:  DIABETES MELLITUS, TYPE II (ICD-250.00)  Will attempt increase metformin 850mg  by mouth two times a day (diarrhea reported previously with this dose) May need to increase glipizede to two times a day dosing. She is currently constipated and may tolerate increase in metformin.  Her updated medication list for this problem includes:    Glipizide 10 Mg Tabs (Glipizide) .Marland Kitchen... Take 2 tablets once daily    Aspirin 81 Mg Tbec (Aspirin) .Marland Kitchen... Take 1 tablet by mouth once a day    Metformin Hcl 850 Mg Tabs (Metformin hcl) .Marland Kitchen... Take 1 tablet by mouth two times a day    Lisinopril 40 Mg Tabs (Lisinopril) .Marland Kitchen... Take 1 tablet by mouth once a day  Orders: T- Capillary Blood Glucose ([id number]) T-Hgb A1C (in-house) ([id number])  Labs Reviewed: Creat: 0.91 (10/04/2008)     Last Eye Exam: No diabetic retinopathy.    (10/27/2008) Reviewed HgBA1c results: 7.1 (12/29/2008)  7.3 (10/04/2008)  Problem # 2:  HYPERTENSION  (ICD-401.9) 132/82 manually rechecked  No changes continue to monitor  Her updated medication list for this problem includes:    Hydrochlorothiazide 25 Mg Tabs (Hydrochlorothiazide) .Marland Kitchen... Take 1 tablet by mouth once a day    Lisinopril 40 Mg Tabs (Lisinopril) .Marland Kitchen... Take 1 tablet by mouth once a day  BP today: 141/81 Prior BP: 150/90 (12/29/2008)  Prior 10 Yr Risk Heart Disease: Not enough information (07/09/2006)  Labs Reviewed: K+: 4.1 (10/04/2008) Creat: : 0.91 (10/04/2008)   Chol: 169 (10/13/2008)   HDL: 42 (10/13/2008)   LDL: 107 (10/13/2008)   TG: 99 (10/13/2008)  Problem # 3:  HYPERLIPIDEMIA (ICD-272.4) Changed lovastatin to pravastatin last visit Not fasting today  Check next visit  Her updated medication list for this problem includes:    Pravachol 40 Mg Tabs (Pravastatin sodium) .Marland Kitchen... Take 1 tablet by mouth once a day  Labs Reviewed: SGOT: 17 (01/04/2008)   SGPT: 25 (01/04/2008)  Prior 10 Yr Risk Heart Disease: Not enough information (07/09/2006)   HDL:42 (10/13/2008), 36 (04/10/2008)  LDL:107 (10/13/2008), 91 (16/02/9603)  Chol:169 (10/13/2008), 145 (04/10/2008)  Trig:99 (10/13/2008), 92 (04/10/2008)  Complete Medication List: 1)  Pravachol 40 Mg Tabs (Pravastatin sodium) .... Take 1 tablet by mouth once a day 2)  Zyrtec 10 Mg Tabs (Cetirizine hcl) .... Take 1 tablet by mouth once a day 3)  Glipizide 10 Mg Tabs (Glipizide) .... Take 2 tablets once daily 4)  Aspirin 81 Mg Tbec (Aspirin) .... Take 1 tablet by mouth once a day 5)  Metformin Hcl 850 Mg Tabs (Metformin hcl) .... Take 1 tablet by mouth two times a day 6)  Constulose 10 Gm/6ml Soln (Lactulose) .... Take 15 ml twice a day for constipation as needed. 7)  Hydrochlorothiazide 25 Mg Tabs (Hydrochlorothiazide) .... Take 1 tablet by mouth once a day 8)  Lisinopril 40 Mg Tabs (Lisinopril) .... Take 1 tablet by mouth once a day  Patient Instructions: 1)  Please schedule a follow-up appointment in 3 months. 2)   Increase metformin to 850mg  by mouth two times a day. 3)  Call if you have any problems with the change. 4)  Will check cholesterol and liver function next visit, make sure to come in on empty stomach. 5)  If you have any problems before your next visit, or if your ears keep bothering you, call clinic. 6)  Make sure to use ear drops for wax. 7)  Do not use Q-tips. Prescriptions: METFORMIN HCL 850 MG TABS (METFORMIN HCL) Take 1 tablet by mouth two times a day  #60 x 5   Entered and Authorized by:   [name]   Signed by:   [name] on 03/08/2009   Method used:   Electronically to        [facility]  [address] [phone]* (retail)       [address]       [place], [place]  [address]       Ph: [phone]       Fax: [phone]   RxID:   [id number]   Prevention & Chronic Care Immunizations   Influenza vaccine: Fluvax Non-MCR  (03/25/2007)    Tetanus booster: 12/29/2008: Td    Pneumococcal vaccine: Not documented    H. zoster vaccine: Not documented  Colorectal Screening   Hemoccult: Not documented    Colonoscopy: Results: Small internal Hemorrhoids.       (09/23/2004)   Colonoscopy action/deferral: Repeat colonoscopy in 5 years.   (09/23/2004)   Colonoscopy due: 10/2009  Other Screening   Pap smear: Not documented   Pap smear action/deferral: Deferred  (12/29/2008)    Mammogram: ASSESSMENT: Negative - BI-RADS 1^MS DIGITAL SCREENING  (08/28/2008)   Mammogram action/deferral: Screening mammogram in 1 year.     (08/26/2007)   Mammogram due: 08/20/2009    DXA bone density scan: Not documented   Smoking status: never  (03/08/2009)  Diabetes Mellitus   HgbA1C: 7.1  (12/29/2008)    Eye exam: No diabetic retinopathy.     (10/27/2008)   Eye exam due: 11/2009    Foot exam: yes  (07/31/2008)   High risk foot: Not documented  Foot care education: Not documented    Urine microalbumin/creatinine ratio: 11.0   (10/04/2008)  Lipids   Total Cholesterol: 169  (10/13/2008)   LDL: 107  (10/13/2008)   LDL Direct: Not documented   HDL: 42  (10/13/2008)   Triglycerides: 99  (10/13/2008)    SGOT (AST): 17  (01/04/2008)   SGPT (ALT): 25  (01/04/2008)   Alkaline phosphatase: 47  (01/04/2008)   Total bilirubin: 0.5  (01/04/2008)  Hypertension   Last Blood Pressure: 141 / 81  (03/08/2009)   Serum creatinine: 0.91  (10/04/2008)   Serum potassium 4.1  (10/04/2008)  Self-Management Support :    Diabetes self-management support: Not documented    Hypertension self-management support: Not documented    Lipid self-management support: Not documented     Appended Document: A1C RESULTS    Lab Visit  Laboratory Results   Blood Tests   Date/Time Received: March 08, 2009 11:23 AM Date/Time Reported: Alric Quan  March 08, 2009 11:23 AM   HGBA1C: 7.5%   (Normal Range: Non-Diabetic - 3-6%   Control Diabetic - 6-8%) CBG Random:: 210mg /dL    Orders Today:

## 2010-06-04 NOTE — Assessment & Plan Note (Signed)
Summary: ACUTE-YEAST INFECTION-aLVAREZ/CFB   Vital Signs:  Patient profile:   65 year old female Height:      59 inches (149.86 cm) Weight:      172.3 pounds (78.32 kg) BMI:     34.93 Temp:     98.6 degrees F (37.00 degrees C) oral Pulse rate:   96 / minute BP sitting:   135 / 76  (right arm)  Vitals Entered By: Stanton Kidney Ditzler RN (August 14, 2009 9:35 AM) Is Patient Diabetic? Yes Did you bring your meter with you today? Yes computers are down Pain Assessment Patient in pain? no      Nutritional Status BMI of > 30 = obese Nutritional Status Detail appetite good  Have you ever been in a relationship where you felt threatened, hurt or afraid?denies   Does patient need assistance? Functional Status Self care Ambulation Normal Comments CBG done at home this AM 95. Has brown vag discharge with no odor.   Primary Care Provider:  Eliseo Gum MD   History of Present Illness: 65 yo female with PMH outlined below presents to [facility] for regular follow up appointment. She has no concerns at the time. No recent sicknesses or hospitalizaitons. No episodes of fever, chills,  other systemic concerns, no palpitations. No specific abdominal or urinary concerns. No recent changes in appetite, weight, sleep patterns, mood. She has been struggling recurrent yeast infection and she usually gets treated and it helps. This time she waited several day to see if it will go away on its own but it did not and she still has itching and discomfort.    Depression History:      The patient denies a depressed mood most of the day and a diminished interest in her usual daily activities.  The patient denies significant weight loss, significant weight gain, insomnia, hypersomnia, psychomotor agitation, psychomotor retardation, fatigue (loss of energy), feelings of worthlessness (guilt), impaired concentration (indecisiveness), and recurrent thoughts of death or suicide.        The patient denies that she feels  like life is not worth living, denies that she wishes that she were dead, and denies that she has thought about ending her life.         Preventive Screening-Counseling & Management  Alcohol-Tobacco     Smoking Status: never  Caffeine-Diet-Exercise     Does Patient Exercise: no  Problems Prior to Update: 1)  Back Pain, Lumbar  (ICD-724.2) 2)  Preventive Health Care  (ICD-V70.0) 3)  Conjunctivitis, Viral, Left  (ICD-077.99) 4)  Abdominal Pain Right Upper Quadrant  (ICD-789.01) 5)  Colonic Polyps, Hx of  (ICD-V12.72) 6)  Abdominal Pain, Left Lower Quadrant  (ICD-789.04) 7)  Abnormal Thyroid Function Tests  (ICD-794.5) 8)  Osteoarthritis  (ICD-715.90) 9)  Obesity Nos  (ICD-278.00) 10)  Hemorrhoids, Internal  (ICD-455.0) 11)  Hemorrhoids, External  (ICD-455.3) 12)  Skin Tag  (ICD-701.9) 13)  Fatty Liver Disease  (ICD-571.8) 14)  Hypertension  (ICD-401.9) 15)  Hyperlipidemia  (ICD-272.4) 16)  Diabetes Mellitus, Type II  (ICD-250.00) 17)  Anemia-nos  (ICD-285.9) 18)  Allergic Rhinitis  (ICD-477.9)  Medications Prior to Update: 1)  Pravachol 40 Mg Tabs (Pravastatin Sodium) .... Take 1 Tablet By Mouth Once A Day 2)  Zyrtec 10 Mg Tabs (Cetirizine Hcl) .... Take 1 Tablet By Mouth Once A Day 3)  Glipizide 10 Mg  Tabs (Glipizide) .... Take 2 Tablets Once Daily 4)  Aspirin 81 Mg Tbec (Aspirin) .... Take 1 Tablet By Mouth Once  A Day 5)  Metformin Hcl 850 Mg Tabs (Metformin Hcl) .... Take 1 Tablet By Mouth Two Times A Day 6)  Hydrochlorothiazide 25 Mg Tabs (Hydrochlorothiazide) .... Take 1 Tablet By Mouth Once A Day 7)  Lisinopril 40 Mg Tabs (Lisinopril) .... Take 1 Tablet By Mouth Once A Day 8)  Coreg 3.125 Mg Tabs (Carvedilol) .... Take 1 Tablet By Mouth Two Times A Day  Current Medications (verified): 1)  Pravachol 40 Mg Tabs (Pravastatin Sodium) .... Take 1 Tablet By Mouth Once A Day 2)  Zyrtec 10 Mg Tabs (Cetirizine Hcl) .... Take 1 Tablet By Mouth Once A Day 3)  Glipizide 10 Mg   Tabs (Glipizide) .... Take 2 Tablets Once Daily 4)  Aspirin 81 Mg Tbec (Aspirin) .... Take 1 Tablet By Mouth Once A Day 5)  Metformin Hcl 850 Mg Tabs (Metformin Hcl) .... Take 1 Tablet By Mouth Two Times A Day 6)  Hydrochlorothiazide 25 Mg Tabs (Hydrochlorothiazide) .... Take 1 Tablet By Mouth Once A Day 7)  Lisinopril 40 Mg Tabs (Lisinopril) .... Take 1 Tablet By Mouth Once A Day 8)  Coreg 3.125 Mg Tabs (Carvedilol) .... Take 1 Tablet By Mouth Two Times A Day  Allergies: 1)  ! Glucophage  Past History:  Past Medical History: Last updated: 2007/04/10 Chest Pain 12/07 - Cardiology inpatient referral, Dr. Aubery [facility] - Negative Tc-99 Myoview, EF 70% 04/14/06  - Believed to be 2/2 GI as relieved by PPI GERD vs Gastritis (12/07) Allergic rhinitis Anemia-NOS-normocytic. no further w/u done. Diabetes mellitus, type II Hyperlipidemia Hypertension - dx 1995 nonalcoholic fatty liver disease 2/2 wt and DM- Abd US1/06, MRI2/06 -  5 liver lesions from fatty infiltrate and likely regenerationg nodules CT 2/06; MRI 2/06, F/U W/ CONTRAST CT -  Mann to f.u?; nl alpha-fetoprotein 2/06 hx of hyperkalemia, further details not known. obesity-BMI 35.5 (4/06) renal lesion- right lower pole - 2/06 recurrent vaginal Candidiasis Sleep disturbances -  periodic limb movement- sleep study 5/05 -  Bruxism (grinding teeth)-sleep study 5/05 -  occasional sleep disorder breathing with RDI 2.2 skin tags external auditory canal, S/P removal 4/06 hemorrhoids, small, internal and external. no polyps-Colonoscopy, Dr. Loreta Ave- 8/06;9/00 Otitis Media- bilateral 2/03 RUQ abd pain- neg HIDA 1/06 Osteoarthritis, bilateral knee (see PSH) - R knee arthroscopy - L knee moderative tricompartmental disease (4 view X-ray 9/07)   Past Surgical History: Last updated: April 10, 2007 Hysterectomy - TAH/BSO - 1999, reason unknown to me. no records. Knee arthroscopy, Right 2000 Tubal ligation, bilateral  Family  History: Last updated: 04-10-2007 Father died of stroke in his 27's Sister with MI at age 68 Family History of CAD Female 1st degree relative <60 Family History of Stroke M 1st degree relative <50  Social History: Last updated: 2007/04/10 Never Smoked Alcohol use-no Drug use-no Married and lives with her husband. 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  Risk Factors: Exercise: no (08/14/2009)  Risk Factors: Smoking Status: never (08/14/2009)  Family History: Reviewed history from April 10, 2007 and no changes required. Father died of stroke in his 1's Sister with MI at age 85 Family History of CAD Female 1st degree relative <60 Family History of Stroke M 1st degree relative <50  Social History: Reviewed history from 2007-04-10 and no changes required. Never Smoked Alcohol use-no Drug use-no Married and lives with her husband. 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  Review of Systems       per HPI  Physical Exam  General:  Well-developed,well-nourished,in no acute distress; alert,appropriate and cooperative throughout examination Lungs:  Normal respiratory effort, chest expands symmetrically. Lungs are clear to auscultation, no crackles or wheezes. Heart:  Normal rate and regular rhythm. S1 and S2 normal without gallop, murmur, click, rub or other extra sounds. Genitalia:  no external lesions, mucosa pink and moist, and no vaginal atrophy, white discharge, malodorous Neurologic:  No cranial nerve deficits noted. Station and gait are normal. Plantar reflexes are down-going bilaterally. DTRs are symmetrical throughout. Sensory, motor and coordinative functions appear intact. Inguinal Nodes:  No significant adenopathy Psych:  Cognition and judgment appear intact. Alert and cooperative with normal attention  span and concentration. No apparent delusions, illusions, hallucinations  Diabetes Management Exam:    Foot Exam (with socks and/or shoes not present):       Sensory-Monofilament:          Left foot: normal          Right foot: normal   Impression & Recommendations:  Problem # 1:  HYPERTENSION (ICD-401.9) Good control will continue the same regimen.  Her updated medication list for this problem includes:    Hydrochlorothiazide 25 Mg Tabs (Hydrochlorothiazide) .Marland Kitchen... Take 1 tablet by mouth once a day    Lisinopril 40 Mg Tabs (Lisinopril) .Marland Kitchen... Take 1 tablet by mouth once a day    Coreg 3.125 Mg Tabs (Carvedilol) .Marland Kitchen... Take 1 tablet by mouth two times a day  BP today: 135/76 Prior BP: 155/81 (07/03/2009)  Prior 10 Yr Risk Heart Disease: Not enough information (07/09/2006)  Labs Reviewed: K+: 4.1 (10/04/2008) Creat: : 0.91 (10/04/2008)   Chol: 169 (10/13/2008)   HDL: 42 (10/13/2008)   LDL: 107 (10/13/2008)   TG: 99 (10/13/2008)  Problem # 2:  HYPERLIPIDEMIA (ICD-272.4) Will recheck FLP today and readjust the regimen as indicated.  Her updated medication list for this problem includes:    Pravachol 40 Mg Tabs (Pravastatin sodium) .Marland Kitchen... Take 1 tablet by mouth once a day  Orders: T-Lipid Profile [id number]  Labs Reviewed: SGOT: 17 (01/04/2008)   SGPT: 25 (01/04/2008)  Prior 10 Yr Risk Heart Disease: Not enough information (07/09/2006)   HDL:42 (10/13/2008), 36 (04/10/2008)  LDL:107 (10/13/2008), 91 (96/29/5284)  Chol:169 (10/13/2008), 145 (04/10/2008)  Trig:99 (10/13/2008), 92 (04/10/2008)  Problem # 3:  CANDIDIASIS, VAGINAL (ICD-112.1) Recurrent problem. Will treat with miconazole and diflucan. I have given patient brochure on yeast infection and I told her to call back if her symptoms do not improve.  Her updated medication list for this problem includes:    Diflucan 150 Mg Tabs (Fluconazole) .Marland Kitchen... Take 1 tablet today    Miconazole Nitrate 2 % Aerp (Miconazole nitrate)  .Marland Kitchen... Apply 2 times per day  Problem # 4:  DIABETES MELLITUS, TYPE II (ICD-250.00) Will continue the same regimen and will recheck A1C  on her next appointment.  Her updated medication list for this problem includes:    Glipizide 10 Mg Tabs (Glipizide) .Marland Kitchen... Take 2 tablets once daily    Aspirin 81 Mg Tbec (Aspirin) .Marland Kitchen... Take 1 tablet by mouth once a day    Metformin Hcl 850 Mg Tabs (Metformin hcl) .Marland Kitchen... Take 1 tablet by mouth two times a day    Lisinopril 40 Mg Tabs (Lisinopril) .Marland Kitchen... Take 1 tablet by mouth once a day  Labs Reviewed: Creat: 0.91 (10/04/2008)     Last Eye Exam: No diabetic retinopathy.    (  10/27/2008) Reviewed HgBA1c results: 8.0 (07/03/2009)  7.5 (03/08/2009)  Complete Medication List: 1)  Pravachol 40 Mg Tabs (Pravastatin sodium) .... Take 1 tablet by mouth once a day 2)  Zyrtec 10 Mg Tabs (Cetirizine hcl) .... Take 1 tablet by mouth once a day 3)  Glipizide 10 Mg Tabs (Glipizide) .... Take 2 tablets once daily 4)  Aspirin 81 Mg Tbec (Aspirin) .... Take 1 tablet by mouth once a day 5)  Metformin Hcl 850 Mg Tabs (Metformin hcl) .... Take 1 tablet by mouth two times a day 6)  Hydrochlorothiazide 25 Mg Tabs (Hydrochlorothiazide) .... Take 1 tablet by mouth once a day 7)  Lisinopril 40 Mg Tabs (Lisinopril) .... Take 1 tablet by mouth once a day 8)  Coreg 3.125 Mg Tabs (Carvedilol) .... Take 1 tablet by mouth two times a day 9)  Diflucan 150 Mg Tabs (Fluconazole) .... Take 1 tablet today 10)  Miconazole Nitrate 2 % Aerp (Miconazole nitrate) .... Apply 2 times per day  Patient Instructions: 1)  Please schedule a follow-up appointment in 3 months. 2)  Please check your blood pressure regularly, if it is >170 please call clinic at [phone] 3)  Please check your sugar levels regularly and remember to bring the meter with you to the next clinic appointment, if the sugars are > 350 or < 60 please call us at [phone] Prescriptions: HYDROCHLOROTHIAZIDE 25 MG TABS  (HYDROCHLOROTHIAZIDE) Take 1 tablet by mouth once a day  #30 x prn   Entered and Authorized by:   Mliss Sax MD   Signed by:   Mliss Sax MD on 08/14/2009   Method used:   Electronically to        [facility] [facility] [address] [phone]* (retail)       [address]       [place], [place]  [address]       Ph: [phone]       Fax: [phone]   RxID:   [id number] METFORMIN HCL 850 MG TABS (METFORMIN HCL) Take 1 tablet by mouth two times a day  #60 x 5   Entered and Authorized by:   Mliss Sax MD   Signed by:   Mliss Sax MD on 08/14/2009   Method used:   Electronically to        [facility] [id number] (retail)       [address]       [place], [place]  [address]       Ph: [phone]       Fax: [phone]   RxID:   [id number] GLIPIZIDE 10 MG  TABS (GLIPIZIDE) Take 2 tablets once daily  #60 x 11   Entered and Authorized by:   Mliss Sax MD   Signed by:   Mliss Sax MD on 08/14/2009   Method used:   Electronically to        [facility] [facility] [address] [id number] (retail)       [address]       [place], [place]  [address]       Ph: [phone]       Fax: [phone]   RxID:   [id number] PRAVACHOL 40 MG TABS (PRAVASTATIN SODIUM) Take 1 tablet by mouth once a day  #30 x 11   Entered and Authorized by:   Mliss Sax  MD   Signed by:   Mliss Sax MD on 08/14/2009   Method used:   [facility]ronically to        [facility] [facility] [address] [id number] ([facility]ail)       [address]       [place], [place]  [address]       Ph: [phone]       Fax: [phone]   RxID:   [id number] MICONAZOLE NITRATE 2 % AERP (MICONAZOLE NITRATE) apply 2 times per day  #1 x 1   Entered and Authorized by:   Mliss Sax MD   Signed by:   Mliss Sax MD on 08/14/2009   Method used:   Electronically to        [facility] [facility] [address] [phone]* (retail)       [address]       [place], [place]   [address]       Ph: [phone]       Fax: [phone]   RxID:   [id number] DIFLUCAN 150 MG TABS (FLUCONAZOLE) take 1 tablet today  #1 x 0   Entered and Authorized by:   Mliss Sax MD   Signed by:   Mliss Sax MD on 08/14/2009   Method used:   [facility]ron[facility] to        [facility] [facility] [address] [phone]* (retail)       [address]       [place], [place]  [address]       Ph: [phone]       Fax: [phone]   RxID:   [id number]  Process Orders Check Orders Results:     [facility]: ABN not required for this insurance Order queued for requisitioning for [facility]: August 14, 2009 10:22 AM  Tests Sent for requisitioning (August 14, 2009 10:22 AM):     08/14/2009: [facility] -- T-Lipid Profile [id number] (signed)    Diabetic Foot Exam Foot Inspection Is there a history of a foot ulcer?              No Is there a foot ulcer now?              No Can the patient see the bottom of their feet?          No Are the shoes appropriate in style and fit?          No Is there swelling or an abnormal foot shape?          No Are the toenails long?                No Are the toenails thick?                No Are the toenails ingrown?              No Is there heavy callous build-up?              No Is there pain in the calf muscle (Intermittent claudication) when walking?    NoIs there a claw toe deformity?              No Is there elevated skin temperature?            No Is there limited ankle dorsiflexion?  No Is there foot or ankle muscle weakness?            No  Diabetic Foot Care Education Patient educated on appropriate care of diabetic feet.     10-g (5.07) Semmes-Weinstein Monofilament Test           Right Foot          Left Foot Visual Inspection     normal           normal Test Control      normal         normal Site 1         normal         normal Site 2         normal         normal Site 3         normal          normal Site 4         normal         normal Site 5         normal         normal Site 6         normal         normal Site 7         normal         normal Site 8         normal         normal Site 9         normal         normal Site 10         normal         normal  Impression      normal         normal   Prevention & Chronic Care Immunizations   Influenza vaccine: Fluvax Non-MCR  (03/25/2007)   Influenza vaccine deferral: Not indicated  (08/14/2009)    Tetanus booster: 12/29/2008: Td    Pneumococcal vaccine: Not documented    H. zoster vaccine: Not documented   H. zoster vaccine deferral: Not indicated  (08/14/2009)  Colorectal Screening   Hemoccult: Not documented   Hemoccult action/deferral: Not indicated  (08/14/2009)    Colonoscopy: Results: Small internal Hemorrhoids.       (09/23/2004)   Colonoscopy action/deferral: Repeat colonoscopy in 5 years.   (09/23/2004)   Colonoscopy due: 10/2009  Other Screening   Pap smear: Not documented   Pap smear action/deferral: Not indicated S/P hysterectomy  (07/03/2009)    Mammogram: ASSESSMENT: Negative - BI-RADS 1^MS DIGITAL SCREENING  (08/28/2008)   Mammogram action/deferral: Screening mammogram in 1 year.     (08/26/2007)   Mammogram due: 08/20/2009    DXA bone density scan: Not documented   Smoking status: never  (08/14/2009)  Diabetes Mellitus   HgbA1C: 8.0  (07/03/2009)    Eye exam: No diabetic retinopathy.     (10/27/2008)   Eye exam due: 11/2009    Foot exam: yes  (08/14/2009)   Foot exam action/deferral: Do today   High risk foot: Not documented   Foot care education: Done  (08/14/2009)    Urine microalbumin/creatinine ratio: 11.0  (10/04/2008)    Diabetes flowsheet reviewed?: Yes   Progress toward A1C goal: Unchanged  Lipids   Total Cholesterol: 169  (10/13/2008)   Lipid panel action/deferral: Lipid Panel ordered   LDL: 107  (10/13/2008)   LDL Direct: Not documented  HDL: 42   (10/13/2008)   Triglycerides: 99  (10/13/2008)    SGOT (AST): 17  (01/04/2008)   BMP action: Ordered   SGPT (ALT): 25  (01/04/2008)   Alkaline phosphatase: 47  (01/04/2008)   Total bilirubin: 0.5  (01/04/2008)    Lipid flowsheet reviewed?: Yes   Progress toward LDL goal: Deteriorated  Hypertension   Last Blood Pressure: 135 / 76  (08/14/2009)   Serum creatinine: 0.91  (10/04/2008)   BMP action: Ordered   Serum potassium 4.1  (10/04/2008)    Hypertension flowsheet reviewed?: Yes   Progress toward BP goal: At goal  Self-Management Support :   Personal Goals (by the next clinic visit) :     Personal A1C goal: 7  (07/03/2009)     Personal blood pressure goal: 130/80  (07/03/2009)     Personal LDL goal: 100  (07/03/2009)    Patient will work on the following items until the next clinic visit to reach self-care goals:     Medications and monitoring: take my medicines every day, check my blood sugar, check my blood pressure, bring all of my medications to every visit, examine my feet every day  (08/14/2009)     Eating: eat more vegetables, use fresh or frozen vegetables, eat foods that are low in salt, eat fruit for snacks and desserts, limit or avoid alcohol  (08/14/2009)     Activity: take a 30 minute walk every day, park at the far end of the parking lot  (08/14/2009)    Diabetes self-management support: Written self-care plan, Education handout, Resources for patients handout  (08/14/2009)   Diabetes care plan printed   Diabetes education handout printed   Last diabetes self-management training by diabetes educator: 07/13/2009    Hypertension self-management support: Written self-care plan, Education handout, Resources for patients handout  (08/14/2009)   Hypertension self-care plan printed.   Hypertension education handout printed    Lipid self-management support: Written self-care plan, Education handout, Resources for patients handout  (08/14/2009)   Lipid self-care plan  printed.   Lipid education handout printed      Resource handout printed.   Nursing Instructions: Diabetic foot exam today

## 2010-06-04 NOTE — Progress Notes (Signed)
Summary: refill/ hla  Phone Note Refill Request Message from:  Fax from Pharmacy on March 19, 2010 2:40 PM  Refills Requested: Medication #1:  METFORMIN HCL 500 MG TABS Take 1 tablet by mouth two times a day   Dosage confirmed as above?Dosage Confirmed   Last Refilled: 7/28 last visit and labs 01/2010  Initial call taken by: Marin Roberts RN,  March 19, 2010 2:41 PM  Follow-up for Phone Call        Rx faxed to pharmacy Follow-up by: [name],  March 20, 2010 2:56 PM    Prescriptions: METFORMIN HCL 500 MG TABS (METFORMIN HCL) Take 1 tablet by mouth two times a day  #60 x 3   Entered and Authorized by:   [name]   Signed by:   [name] on 03/20/2010   Method used:   Electronically to        [facility] [phone]* (retail)       [address]       [place], [place]  [address]       Ph: [phone]       Fax: [phone]   RxID:   [id number]

## 2010-06-04 NOTE — Progress Notes (Signed)
Summary: med refill/gp  Phone Note Refill Request Message from:  Fax from Pharmacy on April 03, 2010 10:25 AM  Refills Requested: Medication #1:  LISINOPRIL 40 MG TABS Take 1 tablet by mouth once a day.   Last Refilled: 02/28/2010 Last appt. Nov. 15.   Method Requested: Electronic Initial call taken by: Chinita Pester RN,  April 03, 2010 10:25 AM  Follow-up for Phone Call        Rx faxed to pharmacy Follow-up by: [name],  April 04, 2010 11:53 PM    Prescriptions: LISINOPRIL 40 MG TABS (LISINOPRIL) Take 1 tablet by mouth once a day  #30 x 6   Entered and Authorized by:   [name]   Signed by:   [name] on 04/04/2010   Method used:   Electronically to        [facility] [phone]* (retail)       [address]       [place], [place]  [address]       Ph: [phone]       Fax: [phone]   RxID:   [id number]

## 2010-06-04 NOTE — Progress Notes (Signed)
Summary: med refill/gp  Phone Note Refill Request Message from:  Fax from Pharmacy on November 24, 2008 9:33 AM  Refills Requested: Medication #1:  LISINOPRIL 40 MG TABS Take 1 tablet by mouth once a day   Last Refilled: 10/19/2008  Method Requested: Electronic Initial call taken by: Chinita Pester RN,  November 24, 2008 9:33 AM  Follow-up for Phone Call        sent  electronically Follow-up by: [name],  November 25, 2008 1:12 AM    Prescriptions: LISINOPRIL 40 MG TABS (LISINOPRIL) Take 1 tablet by mouth once a day  #30 x 6   Entered and Authorized by:   [name]   Signed by:   [name] on 11/25/2008   Method used:   Electronically to        [facility] [phone]* (retail)       [address]       [place], [place]  [address]       Ph: [phone]       Fax: [phone]   RxID:   [id number]

## 2010-06-04 NOTE — Assessment & Plan Note (Signed)
Summary: est-ck/fu/meds/cfb   Vital Signs:  Patient profile:   65 year old female Height:      59 inches (149.86 cm) Weight:      173.4 pounds (78.82 kg) BMI:     35.15 Temp:     98.4 degrees F (36.89 degrees C) oral Pulse rate:   99 / minute BP sitting:   155 / 81  (left arm) Cuff size:   regular  Vitals Entered By: Theotis Barrio NT II (July 03, 2009 3:01 PM) CC: CBG/A1C,  / ? MEDICATION REFILL  /  ROUTNE OFFICE VISIT Is Patient Diabetic? Yes Did you bring your meter with you today? No Pain Assessment Patient in pain? no      Nutritional Status BMI of > 30 = obese CBG Result 206  Have you ever been in a relationship where you felt threatened, hurt or afraid?No   Does patient need assistance? Functional Status Self care Ambulation Normal Comments CBG/A1C DONE /  ? MEDICATION REFILL  /   ROUTINE  OFFICE VISIT   Primary Care Provider:  Eliseo Gum MD  CC:  CBG/A1C and / ? MEDICATION REFILL  /  ROUTNE OFFICE VISIT.  History of Present Illness: [name] is a 65 yo woman with PMH as outlined below.  She is here for routine follow up.  States she is trying to loose weight.  Recently joined [employer].  Doing ok otherwise, no complaints.  All systems reviewed and negative.   Has colonoscopy due this year with Dr. Loreta Ave   Depression History:      The patient denies a depressed mood most of the day and a diminished interest in her usual daily activities.         Preventive Screening-Counseling & Management  Alcohol-Tobacco     Smoking Status: never  Caffeine-Diet-Exercise     Does Patient Exercise: no  Current Medications (verified): 1)  Pravachol 40 Mg Tabs (Pravastatin Sodium) .... Take 1 Tablet By Mouth Once A Day 2)  Zyrtec 10 Mg Tabs (Cetirizine Hcl) .... Take 1 Tablet By Mouth Once A Day 3)  Glipizide 10 Mg  Tabs (Glipizide) .... Take 2 Tablets Once Daily 4)  Aspirin 81 Mg Tbec (Aspirin) .... Take 1 Tablet By Mouth Once A Day 5)  Metformin Hcl 850 Mg  Tabs (Metformin Hcl) .... Take 1 Tablet By Mouth Two Times A Day 6)  Hydrochlorothiazide 25 Mg Tabs (Hydrochlorothiazide) .... Take 1 Tablet By Mouth Once A Day 7)  Lisinopril 40 Mg Tabs (Lisinopril) .... Take 1 Tablet By Mouth Once A Day  Allergies (verified): 1)  ! Glucophage  Past History:  Past Medical History: Last updated: 03/25/2007 Chest Pain 12/07 - Cardiology inpatient referral, Dr. Aubery [facility] - Negative Tc-99 Myoview, EF 70% 04/14/06  - Believed to be 2/2 GI as relieved by PPI GERD vs Gastritis (12/07) Allergic rhinitis Anemia-NOS-normocytic. no further w/u done. Diabetes mellitus, type II Hyperlipidemia Hypertension - dx 1995 nonalcoholic fatty liver disease 2/2 wt and DM- Abd US1/06, MRI2/06 -  5 liver lesions from fatty infiltrate and likely regenerationg nodules CT 2/06; MRI 2/06, F/U W/ CONTRAST CT -  Mann to f.u?; nl alpha-fetoprotein 2/06 hx of hyperkalemia, further details not known. obesity-BMI 35.5 (4/06) renal lesion- right lower pole - 2/06 recurrent vaginal Candidiasis Sleep disturbances -  periodic limb movement- sleep study 5/05 -  Bruxism (grinding teeth)-sleep study 5/05 -  occasional sleep disorder breathing with RDI 2.2 skin tags external auditory canal, S/P removal  4/06 hemorrhoids, small, internal and external. no polyps-Colonoscopy, Dr. Loreta Ave- 8/06;9/00 Otitis Media- bilateral 2/03 RUQ abd pain- neg HIDA 1/06 Osteoarthritis, bilateral knee (see PSH) - R knee arthroscopy - L knee moderative tricompartmental disease (4 view X-ray 9/07)   Past Surgical History: Last updated: 03/25/2007 Hysterectomy - TAH/BSO - 1999, reason unknown to me. no records. Knee arthroscopy, Right 2000 Tubal ligation, bilateral  Social History: Last updated: 03/25/2007 Never Smoked Alcohol use-no Drug use-no Married and lives with her husband. 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  Risk Factors: Smoking Status: never (07/03/2009)  Review of Systems      See HPI  Physical Exam  General:  alert, cooperative to examination, and overweight-appearing.   Eyes:  vision grossly intact, pupils equal, pupils round, and pupils reactive to light.  anicteric Lungs:  normal respiratory effort and no accessory muscle use.   Neurologic:  alert & oriented X3, cranial nerves II-XII intact, strength normal in all extremities, and gait normal.   Psych:  Oriented X3, memory intact for recent and remote, normally interactive, good eye contact, not anxious appearing, and not depressed appearing.     Impression & Recommendations:  Problem # 1:  HYPERTENSION (ICD-401.9) will add coreg since not at goal titrate up on f/u visits  Her updated medication list for this problem includes:    Hydrochlorothiazide 25 Mg Tabs (Hydrochlorothiazide) .Marland Kitchen... Take 1 tablet by mouth once a day    Lisinopril 40 Mg Tabs (Lisinopril) .Marland Kitchen... Take 1 tablet by mouth once a day    Coreg 3.125 Mg Tabs (Carvedilol) .Marland Kitchen... Take 1 tablet by mouth two times a day  BP today: 155/81 Prior BP: 141/81 (03/08/2009)  Prior 10 Yr Risk Heart Disease: Not enough information (07/09/2006)  Labs Reviewed: K+: 4.1 (10/04/2008) Creat: : 0.91 (10/04/2008)   Chol: 169 (10/13/2008)   HDL: 42 (10/13/2008)   LDL: 107 (10/13/2008)   TG: 99 (10/13/2008)  Problem # 2:  HYPERLIPIDEMIA (ICD-272.4) started statin late last year check FLP and LFTs  Her updated medication list for this problem includes:    Pravachol 40 Mg Tabs (Pravastatin sodium) .Marland Kitchen... Take 1 tablet by mouth once a day  Orders: T-Lipid Profile [id number])  Labs Reviewed: SGOT: 17 (01/04/2008)   SGPT: 25 (01/04/2008)  Prior 10 Yr Risk Heart Disease: Not enough information (07/09/2006)   HDL:42 (10/13/2008), 36 (04/10/2008)  LDL:107 (10/13/2008), 91 (94/85/4627)  Chol:169 (10/13/2008), 145 (04/10/2008)  Trig:99 (10/13/2008), 92  (04/10/2008)  Problem # 3:  DIABETES MELLITUS, TYPE II (ICD-250.00) A1c rising pt reports using meds adequately not eager to start new meds especially insulin I will refer to Jamison Neighbor to further assess options I think with data available and cost, I would lean towards starting long acting insulin (can consider stopping glipizide if so)  Her updated medication list for this problem includes:    Glipizide 10 Mg Tabs (Glipizide) .Marland Kitchen... Take 2 tablets once daily    Aspirin 81 Mg Tbec (Aspirin) .Marland Kitchen... Take 1 tablet by mouth once a day    Metformin Hcl 850 Mg Tabs (Metformin hcl) .Marland Kitchen... Take 1 tablet by mouth two times a day    Lisinopril 40 Mg Tabs (Lisinopril) .Marland Kitchen... Take 1 tablet by mouth once a day  Orders: T- Capillary Blood Glucose (03500) T-Hgb A1C (in-house) ([id number]) Diabetic Clinic Referral (Diabetic)  Labs Reviewed: Creat: 0.91 (10/04/2008)     Last Eye Exam: No diabetic retinopathy.    (  10/27/2008) Reviewed HgBA1c results: 8.0 (07/03/2009)  7.5 (03/08/2009)  Complete Medication List: 1)  Pravachol 40 Mg Tabs (Pravastatin sodium) .... Take 1 tablet by mouth once a day 2)  Zyrtec 10 Mg Tabs (Cetirizine hcl) .... Take 1 tablet by mouth once a day 3)  Glipizide 10 Mg Tabs (Glipizide) .... Take 2 tablets once daily 4)  Aspirin 81 Mg Tbec (Aspirin) .... Take 1 tablet by mouth once a day 5)  Metformin Hcl 850 Mg Tabs (Metformin hcl) .... Take 1 tablet by mouth two times a day 6)  Hydrochlorothiazide 25 Mg Tabs (Hydrochlorothiazide) .... Take 1 tablet by mouth once a day 7)  Lisinopril 40 Mg Tabs (Lisinopril) .... Take 1 tablet by mouth once a day 8)  Coreg 3.125 Mg Tabs (Carvedilol) .... Take 1 tablet by mouth two times a day  Other Orders: T-Comprehensive Metabolic Panel ([id number])  Patient Instructions: 1)  Please schedule with Jamison Neighbor 2)  schedule a follow-up appointment in 1 month. 3)  Have sent in new prescription for carvedilol as prescribed below. 4)  If you  have any problems before your next visit, call clinic. 5)  Make sure to check blood sugar every morning before breakfast. Prescriptions: COREG 3.125 MG TABS (CARVEDILOL) Take 1 tablet by mouth two times a day  #60 x 3   Entered and Authorized by:   [name]   Signed by:   [name] on 07/03/2009   Method used:   Electronically to        [facility]  [address] [phone]* (retail)       [address]       [place], [place]  [address]       Ph: [phone]       Fax: [phone]   RxID:   [id number]  Process Orders Check Orders Results:     [facility]: Order checked:     [name] NOT AUTHORIZED TO ORDER Tests Sent for requisitioning (July 03, 2009 3:47 PM):     07/03/2009: [facility] -- T-Comprehensive Metabolic Panel [[id number]] (signed)     07/03/2009: [facility] -- T-Lipid Profile [phone] (signed)    Prevention & Chronic Care Immunizations   Influenza vaccine: Fluvax Non-MCR  (03/25/2007)    Tetanus booster: 12/29/2008: Td    Pneumococcal vaccine: Not documented    H. zoster vaccine: Not documented   H. zoster vaccine deferral: Deferred  (07/03/2009)  Colorectal Screening   Hemoccult: Not documented    Colonoscopy: Results: Small internal Hemorrhoids.       (09/23/2004)   Colonoscopy action/deferral: Repeat colonoscopy in 5 years.   (09/23/2004)   Colonoscopy due: 10/2009  Other Screening   Pap smear: Not documented   Pap smear action/deferral: Not indicated S/P hysterectomy  (07/03/2009)    Mammogram: ASSESSMENT: Negative - BI-RADS 1^MS DIGITAL SCREENING  (08/28/2008)   Mammogram action/deferral: Screening mammogram in 1 year.     (08/26/2007)   Mammogram due: 08/20/2009    DXA bone density scan: Not documented   Smoking status: never  (07/03/2009)  Diabetes Mellitus   HgbA1C: 8.0  (07/03/2009)    Eye exam: No diabetic retinopathy.      (10/27/2008)   Eye exam due: 11/2009    Foot exam: yes  (07/31/2008)   High risk foot: Not documented   Foot care education: Not documented    Urine microalbumin/creatinine ratio: 11.0  (10/04/2008)  Diabetes flowsheet reviewed?: Yes   Progress toward A1C goal: Unchanged  Lipids   Total Cholesterol: 169  (10/13/2008)   Lipid panel action/deferral: Lipid Panel ordered   LDL: 107  (10/13/2008)   LDL Direct: Not documented   HDL: 42  (10/13/2008)   Triglycerides: 99  (10/13/2008)    SGOT (AST): 17  (01/04/2008)   BMP action: Ordered   SGPT (ALT): 25  (01/04/2008) CMP ordered    Alkaline phosphatase: 47  (01/04/2008)   Total bilirubin: 0.5  (01/04/2008)    Lipid flowsheet reviewed?: Yes   Progress toward LDL goal: Unchanged  Hypertension   Last Blood Pressure: 155 / 81  (07/03/2009)   Serum creatinine: 0.91  (10/04/2008)   BMP action: Ordered   Serum potassium 4.1  (10/04/2008) CMP ordered     Hypertension flowsheet reviewed?: Yes   Progress toward BP goal: Unchanged  Self-Management Support :   Personal Goals (by the next clinic visit) :     Personal A1C goal: 7  (07/03/2009)     Personal blood pressure goal: 130/80  (07/03/2009)     Personal LDL goal: 100  (07/03/2009)    Patient will work on the following items until the next clinic visit to reach self-care goals:     Medications and monitoring: take my medicines every day, check my blood sugar, bring all of my medications to every visit, examine my feet every day  (07/03/2009)     Eating: use fresh or frozen vegetables, eat foods that are low in salt, eat baked foods instead of fried foods, limit or avoid alcohol  (07/03/2009)     Activity: take a 30 minute walk every day  (07/03/2009)    Diabetes self-management support: Written self-care plan  (07/03/2009)   Diabetes care plan printed   Referred for diabetes self-mgmt training.    Hypertension self-management support: Written self-care plan  (07/03/2009)    Hypertension self-care plan printed.    Lipid self-management support: , Written self-care plan  (07/03/2009)   Lipid self-care plan printed.   Nursing Instructions: Give Flu vaccine today   Laboratory Results   Blood Tests   Date/Time Received: July 03, 2009 3:29 PM  Date/Time Reported: Burke Keels  July 03, 2009 3:30 PM   HGBA1C: 8.0%   (Normal Range: Non-Diabetic - 3-6%   Control Diabetic - 6-8%) CBG Random:: 206mg /dL

## 2010-06-04 NOTE — Progress Notes (Signed)
Summary: diabetes support/follow-up/dmr  Phone Note Outgoing Call   Call placed by: Jamison Neighbor RD,CDE,  July 25, 2009 11:27 AM Summary of Call: call to follow-up on goals-eating brewkaftas mostof the time ( 7-80%) and cheching blood sugar once diay 75-80%) she is happy with her success and new behaviors nad feels they are help;ing.   we discussed insulin and her feelings regarding insulin- "point of not return- no control"  and myths associated wiht insulin. Her brother had a good experience with insulin. she will  think about iti and discuss with Dr Sharol Harness at next appointment 4/15 and bring her meter for him to see that she is doing well at checking her blood sugar. if insulin needed- might consider temporary lantus pen (? sample) to bolster her confidence and to help her surmount fears associated with it.             BEHAVIORAL GOAL FOLLOW UP Incorporating appropriate nutritional management: Most of the time Monitoring blood glucose levels daily: Most of the time      Very difficult to help patient focus on specific action plan today. agree that starting insulin may be the best plan for her now. If she gets covered by [facility] [place] would favor lantus. If not could try [facility] reli[facility] N at bedtime.  Diabetes Self Management Support: need to explore at next visit sin more detail. has family, clinic staff, but may need more, almost tearful today, questions if depressed, does not seem to cope in positive ways Follow-up: next week by Noberto Retort- needs and intends to see if she quslifies for help through financial counselor

## 2010-06-04 NOTE — Progress Notes (Signed)
Summary: Refill/gh  Phone Note Refill Request Message from:  Fax from Pharmacy on January 11, 2009 9:58 AM  Refills Requested: Medication #1:  METFORMIN HCL 500 MG  TABS Take 1 tablet by mouth two times a day   Last Refilled: 10/06/2008  Method Requested: Electronic Initial call taken by: Angelina Ok RN,  January 11, 2009 9:58 AM  Follow-up for Phone Call        sent Follow-up by: [name],  January 11, 2009 3:16 PM    Prescriptions: METFORMIN HCL 500 MG  TABS (METFORMIN HCL) Take 1 tablet by mouth two times a day  #60 x prn   Entered and Authorized by:   [name]   Signed by:   [name] on 01/11/2009   Method used:   Electronically to        [facility] [phone]* (retail)       [address]       [place], [place]  [address]       Ph: [phone]       Fax: [phone]   RxID:   [id number]

## 2010-06-04 NOTE — Assessment & Plan Note (Signed)
Summary: Soc. Work  40 minutes. Social Work.  Referred by physician.  Patient needing consult re: Medicaid and overwhelming medical bills.  Does not qualify for clinic discount or [employer].   Primarily listened to patient's story and empathized;  it is unfortunate that she is overincome in qualifying for available programs. I encouraged her to make whatever payments she could in keeping up with her medical bills.

## 2010-06-04 NOTE — Assessment & Plan Note (Signed)
Summary: EST-CK/FU/MEDS/CFB   Vital Signs:  Patient profile:   65 year old female Height:      59 inches (149.86 cm) Weight:      173.3 pounds (78.77 kg) BMI:     35.13 Temp:     98.6 degrees F (37.00 degrees C) oral Pulse rate:   90 / minute BP sitting:   172 / 95  (right arm)  Vitals Entered By: Filomena Jungling NT II (July 31, 2008 8:46 AM) Is Patient Diabetic? Yes  Nutritional Status BMI of > 30 = obese CBG Result 192  Have you ever been in a relationship where you felt threatened, hurt or afraid?No   Does patient need assistance? Functional Status Self care Ambulation Normal   Primary Care Provider:  Eliseo Gum MD   History of Present Illness: [name] is a 65 yo woman with pMH as outlined in chart.  She is here today for routine visit.  She has not complaints today.  She states she is taking all her medications.    On review of systems, she does admit to trouble swallowing for last few months.  Same severity, no better or worse.  Feels like something gets stuck in throat.  Only with solid foods, not with liquids.  Denies any pain with swallowing.  No coughing with liquids.    Preventive Screening-Counseling & Management     Smoking Status: never     Does Patient Exercise: no  Current Medications (verified): 1)  Lovastatin 20 Mg Tabs (Lovastatin) .... Take 1 Tab By Mouth At Bedtime 2)  Zyrtec 10 Mg Tabs (Cetirizine Hcl) .... Take 1 Tablet By Mouth Once A Day 3)  Glipizide 10 Mg  Tabs (Glipizide) .... Take 2 Tablets Once Daily 4)  Aspirin 81 Mg Tbec (Aspirin) .... Take 1 Tablet By Mouth Once A Day 5)  Metformin Hcl 500 Mg  Tabs (Metformin Hcl) .... Take 1 Tablet By Mouth Two Times A Day 6)  Constulose 10 Gm/64ml  Soln (Lactulose) .... Take 15 Ml Twice A Day For Constipation As Needed. 7)  Hydrochlorothiazide 25 Mg Tabs (Hydrochlorothiazide) .... Take 1 Tablet By Mouth Once A Day 8)  Lisinopril 40 Mg Tabs (Lisinopril) .... Take 1 Tablet By Mouth Once A  Day  Allergies: 1)  ! Glucophage  Past History:  Past Medical History:    Chest Pain 12/07    - Cardiology inpatient referral, Dr. [facility]    - Negative Tc-99 Myoview, EF 70% 04/14/06     - Believed to be 2/2 GI as relieved by PPI    GERD vs Gastritis (12/07)    Allergic rhinitis    Anemia-NOS-normocytic. no further w/u done.    Diabetes mellitus, type II    Hyperlipidemia    Hypertension - dx 1995    nonalcoholic fatty liver disease 2/2 wt and DM- Abd US1/06, MRI2/06    -  5 liver lesions from fatty infiltrate and likely regenerationg nodules CT 2/06; MRI 2/06, F/U W/ CONTRAST CT    -  Mann to f.u?; nl alpha-fetoprotein 2/06    hx of hyperkalemia, further details not known.    obesity-BMI 35.5 (4/06)    renal lesion- right lower pole - 2/06    recurrent vaginal Candidiasis    Sleep disturbances    -  periodic limb movement- sleep study 5/05    -  Bruxism (grinding teeth)-sleep study 5/05    -  occasional sleep disorder breathing with RDI 2.2  skin tags external auditory canal, S/P removal 4/06    hemorrhoids, small, internal and external. no polyps-Colonoscopy, Dr. Loreta Ave- 8/06;9/00    Otitis Media- bilateral 2/03    RUQ abd pain- neg HIDA 1/06    Osteoarthritis, bilateral knee (see PSH)    - R knee arthroscopy    - L knee moderative tricompartmental disease (4 view X-ray 9/07)  (03/25/2007)  Past Surgical History:    Hysterectomy - TAH/BSO - 1999, reason unknown to me. no records.    Knee arthroscopy, Right 2000    Tubal ligation, bilateral     (03/25/2007)  Family History:    Father died of stroke in his 67's    Sister with MI at age 34    Family History of CAD Female 1st degree relative <60    Family History of Stroke M 1st degree relative <50     (03/25/2007)  Social History:    Never Smoked    Alcohol use-no    Drug use-no    Married and lives with her husband.    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     (03/25/2007)  Risk Factors:    Alcohol Use: N/A    >5 drinks/d w/in last 3 months: N/A    Caffeine Use: N/A    Diet: N/A    Exercise: no (07/31/2008)  Risk Factors:    Smoking Status: never (07/31/2008)    Packs/Day: N/A    Cigars/wk: N/A    Pipe Use/wk: N/A    Cans of tobacco/wk: N/A    Passive Smoke Exposure: N/A  Review of Systems CV:  Complains of swelling of feet; denies chest pain or discomfort. Resp:  Denies cough, shortness of breath, and wheezing. GI:  Denies abdominal pain, bloody stools, change in bowel habits, dark tarry stools, nausea, and vomiting. Neuro:  Denies numbness and weakness.  Physical Exam  General:  alert, well-developed, well-nourished, and well-hydrated.   Ears:  R ear normal and L ear with some hyperremia on TM, but no tenderness, exudates or bulging.   Neck:  supple, no masses, no thyromegaly, no JVD, no carotid bruits, and no cervical lymphadenopathy.   Lungs:  normal respiratory effort, no accessory muscle use, normal breath sounds, no crackles, and no wheezes.   Heart:  normal rate, regular rhythm, no gallop, no rub, and no JVD.   Grade 1/6 SEM over outflow track Abdomen:  soft, non-tender, normal bowel sounds, and no distention.   Pulses:  2+ DP/PT bilaterally with good cap refill Extremities:  trace edema bilaterally Neurologic:  alert & oriented X3, cranial nerves II-XII intact, strength normal in all extremities, sensation intact to light touch, and gait normal.   Psych:  Oriented X3, memory intact for recent and remote, normally interactive, good eye contact, not anxious appearing, and not depressed appearing.    Diabetes Management Exam:    Foot Exam (with socks and/or shoes not present):       Sensory-Monofilament:          Left foot: normal          Right foot: normal   Impression & Recommendations:  Problem # 1:  HYPERTENSION (ICD-401.9) Above goal, repeat BP 158/98. Pt has  come off HCTZ unintentionally after splitting HCTZ/lisinopril combo. Will restart HCTZ and f/u.  Her updated medication list for this problem includes:    Hydrochlorothiazide 25 Mg Tabs (Hydrochlorothiazide) .Marland Kitchen... Take 1  tablet by mouth once a day    Lisinopril 40 Mg Tabs (Lisinopril) .Marland Kitchen... Take 1 tablet by mouth once a day  BP today: 172/95 Prior BP: 133/74 (03/02/2008)  Prior 10 Yr Risk Heart Disease: Not enough information (07/09/2006)  Labs Reviewed: K+: 4.2 (04/10/2008) Creat: : 0.79 (04/10/2008)   Chol: 145 (04/10/2008)   HDL: 36 (04/10/2008)   LDL: 91 (04/10/2008)   TG: 92 (04/10/2008)  Problem # 2:  HYPERLIPIDEMIA (ICD-272.4)  Currently at goal, <100. Will continue with current dose since it has decreased a bit.   If it holds around this value at next check, will change to pravachol 40mg .  Her updated medication list for this problem includes:    Lovastatin 20 Mg Tabs (Lovastatin) .Marland Kitchen... Take 1 tab by mouth at bedtime  Labs Reviewed: SGOT: 17 (01/04/2008)   SGPT: 25 (01/04/2008)  Prior 10 Yr Risk Heart Disease: Not enough information (07/09/2006)   HDL:36 (04/10/2008), 40 (01/04/2008)  LDL:91 (04/10/2008), 111 (16/02/9603)  Chol:145 (04/10/2008), 172 (01/04/2008)  Trig:92 (04/10/2008), 103 (01/04/2008)  Problem # 3:  DIABETES MELLITUS, TYPE II (ICD-250.00) At goal today.   No changes to meds. Foot exam today. Obtain records from Dr. Mitzi Davenport, eye exam 11/09. Check A1c today. Microalbumin/Cr ratio next visit.   Her updated medication list for this problem includes:    Glipizide 10 Mg Tabs (Glipizide) .Marland Kitchen... Take 2 tablets once daily    Aspirin 81 Mg Tbec (Aspirin) .Marland Kitchen... Take 1 tablet by mouth once a day    Metformin Hcl 500 Mg Tabs (Metformin hcl) .Marland Kitchen... Take 1 tablet by mouth two times a day    Lisinopril 40 Mg Tabs (Lisinopril) .Marland Kitchen... Take 1 tablet by mouth once a day  Orders: T- Capillary Blood Glucose (54098) T-Hgb A1C (in-house) (11914NW)  Labs  Reviewed: Creat: 0.79 (04/10/2008)    Reviewed HgBA1c results: 6.7 (07/31/2008)  7.6 (04/10/2008)  Problem # 4:  PREVENTIVE HEALTH CARE (ICD-V70.0) Mammogram:  next due 08/2008 Colonoscopy done 2006, next due 2011 Eye exam done by Dr. Mitzi Davenport on [address], obtain records.  Complete Medication List: 1)  Lovastatin 20 Mg Tabs (Lovastatin) .... Take 1 tab by mouth at bedtime 2)  Zyrtec 10 Mg Tabs (Cetirizine hcl) .... Take 1 tablet by mouth once a day 3)  Glipizide 10 Mg Tabs (Glipizide) .... Take 2 tablets once daily 4)  Aspirin 81 Mg Tbec (Aspirin) .... Take 1 tablet by mouth once a day 5)  Metformin Hcl 500 Mg Tabs (Metformin hcl) .... Take 1 tablet by mouth two times a day 6)  Constulose 10 Gm/66ml Soln (Lactulose) .... Take 15 ml twice a day for constipation as needed. 7)  Hydrochlorothiazide 25 Mg Tabs (Hydrochlorothiazide) .... Take 1 tablet by mouth once a day 8)  Lisinopril 40 Mg Tabs (Lisinopril) .... Take 1 tablet by mouth once a day  Patient Instructions: 1)  Please schedule a follow-up appointment in 3 months. 2)  Continue with all your medications below. 3)  Start hydrochlorothiazide, HCTZ, 25mg  by mouth daily for your blood pressure. 4)  Your cholesterol is currently doing well. 5)  Your diabetes is currently well controlled. 6)  Mammogram now due in April. 7)  Colonoscopy due next year.  Prescriptions: HYDROCHLOROTHIAZIDE 25 MG TABS (HYDROCHLOROTHIAZIDE) Take 1 tablet by mouth once a day  #30 x prn   Entered and Authorized by:   [name]   Signed by:   [name] on 07/31/2008   Method used:   Electronically to  [facility]  [id number] (retail)       [address]       [place], [place]  [address]       Ph: [phone] or [phone]       Fax: [phone]   RxID:   [id number]        Last LDL:                                                 91 (04/10/2008 9:06:00 PM)        Diabetic Foot  Exam Foot Inspection Is there a history of a foot ulcer?              No Is there a foot ulcer now?              No Can the patient see the bottom of their feet?          Yes Are the shoes appropriate in style and fit?          Yes Is there swelling or an abnormal foot shape?          No Are the toenails long?                No Are the toenails thick?                No Are the toenails ingrown?              No Is there heavy callous build-up?              No Is there a claw toe deformity?                          No Is there elevated skin temperature?            No Is there limited ankle dorsiflexion?            No Is there foot or ankle muscle weakness?            No Do you have pain in calf while walking?           No         10-g (5.07) Semmes-Weinstein Monofilament Test Performed by: Filomena Jungling NT II          Right Foot          Left Foot Site 1         normal         normal Site 2         normal         normal Site 3         normal         normal Site 4         normal         normal Site 5         normal         normal Site 6         normal         normal Site 7         normal         normal Site 8  normal         normal Site 9         normal         normal  Impression      normal         normal  Laboratory Results   Blood Tests   Date/Time Received: July 31, 2008 9:12 AM. Date/Time Reported: Alric Quan  July 31, 2008 9:12 AM  HGBA1C: 6.7%   (Normal Range: Non-Diabetic - 3-6%   Control Diabetic - 6-8%) CBG Random:: 192mg /dL

## 2010-06-04 NOTE — Miscellaneous (Signed)
Summary: Hospital Admission  INTERNAL MEDICINE ADMISSION HISTORY AND PHYSICAL Attending: Dr. Doneen Poisson First Contact: Dr. Narda Bonds [phone] Second Contact: Dr. Eben Burow [phone]  PCP: Dr. Mariea Stable  CC: confusion, chills and malaise  HPI: [name] is a 65yo W who presented to the [facility] ED by EMS with chills, malaise, and confusion. She was in her usual state of heath until the middle of last week when she began noticing increased urinary frequency and urgency. No dysuria. Over the past 1-2 days, she began experiencing chills and decreased appetite. She denied abdominal pain or N/V/D. She also denied other systemic symptoms, such as chest pain, shortness of breath, dizziness, palpitations, orthopnea, or PND. In the [facility] ED, she was found to have Temp 102.6, tacchycardic to 125, and hypotensive with a BP of 97/60. She was also noted to be somewhat confused. She received IVFs and BP improved to 119/68. She was transferred to our service for further evaluation and management.   ALLERGIES: GLUCOPHAGE  PAST MEDICAL HISTORY: Past Medical History: Chest Pain 12/07 - Cardiology inpatient referral, Dr. [facility] - Negative Tc-99 Myoview, EF 70% 04/14/06  - Believed to be 2/2 GI as relieved by PPI GERD vs Gastritis (12/07) Allergic rhinitis Anemia-NOS-normocytic. no further w/u done. Diabetes mellitus, type II Hyperlipidemia Hypertension - dx 1995 nonalcoholic fatty liver disease 2/2 wt and DM- Abd US1/06, MRI2/06 -  5 liver lesions from fatty infiltrate and likely regenerationg nodules CT 2/06; MRI 2/06, F/U W/ CONTRAST CT -  Mann to f.u?; nl alpha-fetoprotein 2/06 hx of hyperkalemia, further details not known. obesity-BMI 35.5 (4/06) renal lesion- right lower pole - 2/06 recurrent vaginal Candidiasis Sleep disturbances -  periodic limb movement- sleep study 5/05 -  Bruxism (grinding teeth)-sleep study 5/05 -  occasional sleep disorder breathing with RDI  2.2 skin tags external auditory canal, S/P removal 4/06 hemorrhoids, small, internal and external. no polyps-Colonoscopy, Dr. Loreta Ave- 8/06;9/00 Otitis Media- bilateral 2/03 RUQ abd pain- neg HIDA 1/06 Osteoarthritis, bilateral knee (see PSH) - R knee arthroscopy - L knee moderative tricompartmental disease (4 view X-ray 9/07)   MEDICATIONS: PRAVACHOL 40 MG TABS (PRAVASTATIN SODIUM) Take 1 tablet by mouth once a day ZYRTEC 10 MG TABS (CETIRIZINE HCL) Take 1 tablet by mouth once a day GLIPIZIDE 10 MG  TABS (GLIPIZIDE) Take 2 tablets once daily ASPIRIN 81 MG TBEC (ASPIRIN) Take 1 tablet by mouth once a day METFORMIN HCL 850 MG TABS (METFORMIN HCL) Take 1 tablet by mouth two times a day HYDROCHLOROTHIAZIDE 25 MG TABS (HYDROCHLOROTHIAZIDE) Take 1 tablet by mouth once a day LISINOPRIL 40 MG TABS (LISINOPRIL) Take 1 tablet by mouth once a day COREG 3.125 MG TABS (CARVEDILOL) Take 1 tablet by mouth two times a day MICONAZOLE NITRATE 2 % AERP (MICONAZOLE NITRATE) apply 2 times per day  SOCIAL HISTORY: Never Smoked Alcohol use-no Drug use-no Married and lives with her husband. 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   FAMILY HISTORY Father died of stroke in his 34's Sister with MI at age 3 Family History of CAD Female 1st degree relative <60 Family History of Stroke M 1st degree relative <50  ROS: per HPI; + fatigue, malaise, general weakness, loss of appetite, chills, increased urinary frequency and urgency; - nausea/vomiting/diarrhea/dysuria/abdominal pain/chest pain/shortness of breath/palpitations/orthopnea/PND  VITALS: T: 99.0 P: 110 BP: 112/69 R: 18 O2SAT: 95% ON: RA  PHYSICAL EXAM: General:  cooperative to examination, eyes closed most of the time, appeared fatigued,  no acute distress.   Head:  normocephalic and atraumatic.   Eyes:  pupils equal, pupils round, pupils reactive to light, no injection and anicteric.   Mouth:  pharynx pink  and slightly dry, no erythema, and no exudates. Neck:  supple, full ROM.   Lungs:  normal respiratory effort, no accessory muscle use, normal breath sounds, no crackles, and no wheezes.  Heart:  normal rate, regular rhythm, no murmur, no gallop, and no rub.   Abdomen:  soft, non-tender, normal bowel sounds, no distention, no guarding, no rebound tenderness. Msk:  no joint swelling, no joint warmth, and no redness over joints.   Pulses:  2+ DP/PT pulses bilaterally Extremities:  No cyanosis, clubbing, edema  Neurologic:  alert & oriented X3, cranial nerves grossly intact, strength and sensation intact.   Skin:  turgor normal and no rashes. Skin felt warm and clammy to touch.  Psych:  Oriented X3, appeared fatigued, some answers vague or slightly slow to respond  LABS: ZZ-I-STAT, Chem 8   TCO2                                     26                0-100            mmol/L  Ionized Calcium                          1.07       l      1.12-1.32        mmol/L  Hemoglobin (HGB)                         12.2              12.0-15.0        g/dL  Hematocrit (HCT)                         36.0              36.0-46.0        %  Sodium (NA)                              138               135-145          mEq/L  Potassium (K)                            2.9        l      3.5-5.1          mEq/L  Chloride                                 99                96-112           mEq/L  Glucose  189        h      70-99            mg/dL  BUN                                      14                6-23             mg/dL  Creatinine                               1.3        h      0.4-1.2          mg/dL   CKMB, POC                                <1.0       l      1.0-8.0          ng/mL  Troponin I, POC                          <0.05             0.00-0.09        ng/mL  Myoglobin, POC                           >500       H      12-200           ng/mL   WBC                                      2.0         l      4.0-10.5         K/uL    WHITE COUNT CONFIRMED ON SMEAR  RBC                                      3.79       l      3.87-5.11        MIL/uL  Hemoglobin (HGB)                         11.6       l      12.0-15.0        g/dL  Hematocrit (HCT)                         34.3       l      36.0-46.0        %  MCV                                      90.8  78.0-100.0       fL  MCH -                                    30.7              26.0-34.0        pg  MCHC                                     33.9              30.0-36.0        g/dL  RDW                                      16.3       h      11.5-15.5        %  Platelet Count (PLT)                     SEE NOTE.         150-400          K/uL    PLATELETS APPEAR ADEQUATE    SPECIMEN CHECKED FOR CLOTS    REPEATED TO VERIFY    PLATELET COUNT CONFIRMED BY SMEAR  Neutrophils, %                           85         h      43-77            %  Lymphocytes, %                           15                12-46            %  Monocytes, %                             0          l      3-12             %  Eosinophils, %                           0                 0-5              %  Basophils, %                             0                 0-1              %  Neutrophils, Absolute                    1.7  1.7-7.7          K/uL  Lymphocytes, Absolute                    0.3        l      0.7-4.0          K/uL  Monocytes, Absolute                      0.0        l      0.1-1.0          K/uL  Eosinophils, Absolute                    0.0               0.0-0.7          K/uL  Basophils, Absolute                      0.0               0.0-0.1          K/uL  WBC Morphology                           SEE NOTE.    MODERATE LEFT SHIFT (>5% METAS AND MYELOS,OCC PRO NOTED)    VACUOLATED NEUTROPHILS  RBC Morphology                           RARE NRBCs    2 NRBC'S COUNTED IN 100 WBC'S  Smear Review                             SEE NOTE.     LARGE PLATELETS PRESENT    GIANT PLATELETS SEEN   Color, Urine                             AMBER      a      YELLOW    BIOCHEMICALS MAY BE AFFECTED BY COLOR  Appearance                               CLOUDY     a      CLEAR  Specific Gravity                         1.015             1.005-1.030  pH                                       6.0               5.0-8.0  Urine Glucose                            NEGATIVE          NEG              mg/dL  Bilirubin  SMALL      a      NEG  Ketones                                  15         a      NEG              mg/dL  Blood                                    LARGE      a      NEG  Protein                                  100        a      NEG              mg/dL  Urobilinogen                             2.0        h      0.0-1.0          mg/dL  Nitrite                                  NEGATIVE          NEG  Leukocytes                               LARGE      a      NEG   Squamous Epithelial / LPF                RARE              RARE  Casts / HPF                              SEE NOTE.  a      NEG    GRANULAR CAST  WBC / HPF                                21-50             <3               WBC/hpf  RBC / HPF                                3-6               <3               RBC/hpf  Bacteria / HPF                           MANY       a      RARE  ASSESSMENT AND PLAN: (  1) Sepsis: BP improved with fluid resuscitation at [facility]. Vital signs now stable. Will continue IVFs but cautiously given possible CHF (see below). Get Blood culture x2 and follow urine cx. Check procalcitonin.  (2) UTI: Initiate IV cipro. Urine cx from [facility] pending.  (3) Leukopenia: No history of leukopenia. May be secondary to acute infection. Will re-check CBC in am and follow trend WBC count, before initiating any further workeup for this finding.   (4) Hypokalemia: Replete K. Check Mg and PO4 level. Recheck K in am.  (5) Questionable CHF:  Patient cardiomegaly and mild vasc congestion on CXR, no previous hx of CHF. Fluids will be administered with caution. Check BNP. May consider 2D Echo tomorrow.  (6) HTN: Hold antihypertensives in setting of acute sepsis/hypotension. (7) DM2: Hold metformin. Hold glipizide while not eating. Initiate SSI. Recheck HbA1C.  (8) Abnormal peripheral smear: Large/giant platelets seen and vacuolated WBC's. May repeat smear.  (9) VTE PROPH: lovenox  Attending Physician: I have seen and examined the patient. I reviewed the resident/fellow note and agree with the findings and plan of care as documented. My additions and revisions are included.   Signature:

## 2010-06-04 NOTE — Miscellaneous (Signed)
  Prescriptions: FLEXERIL 5 MG TABS (CYCLOBENZAPRINE HCL) Take 1 tablet by mouth two times a day as needed for pain  #28 x 1   Entered and Authorized by:   Ulyess Mort MD   Signed by:   Ulyess Mort MD on 06/26/2006   Method used:   Telephoned to ...         RxID:   [id number]  Clinical Lists Changes  Medications: Added new medication of FLEXERIL 5 MG TABS (CYCLOBENZAPRINE HCL) Take 1 tablet by mouth two times a day as needed for pain - Signed Rx of FLEXERIL 5 MG TABS (CYCLOBENZAPRINE HCL) Take 1 tablet by mouth two times a day as needed for pain;  #28 x 1;  Signed;  Entered by: Ulyess Mort MD;  Authorized by: Ulyess Mort MD;  Method used: Telephoned to

## 2010-06-04 NOTE — Letter (Signed)
Summary: DR. Charna Elizabeth  DR. Charna Elizabeth   Imported By: Margie Billet 09/04/2009 11:22:05  _____________________________________________________________________  External Attachment:    Type:   Image     Comment:   External Document  Appended Document: DR. Charna Elizabeth    Clinical Lists Changes  Problems: Added new problem of CONSTIPATION (ICD-564.00)       Problem # 13:  CONSTIPATION (ICD-564.00) fiber OTC colace Miralax Above per Dr. Loreta Ave  Complete Medication List: 1)  Pravachol 40 Mg Tabs (Pravastatin sodium) .... Take 1 tablet by mouth once a day 2)  Zyrtec 10 Mg Tabs (Cetirizine hcl) .... Take 1 tablet by mouth once a day 3)  Glipizide 10 Mg Tabs (Glipizide) .... Take 2 tablets once daily 4)  Aspirin 81 Mg Tbec (Aspirin) .... Take 1 tablet by mouth once a day 5)  Metformin Hcl 850 Mg Tabs (Metformin hcl) .... Take 1 tablet by mouth two times a day 6)  Hydrochlorothiazide 25 Mg Tabs (Hydrochlorothiazide) .... Take 1 tablet by mouth once a day 7)  Lisinopril 40 Mg Tabs (Lisinopril) .... Take 1 tablet by mouth once a day 8)  Coreg 3.125 Mg Tabs (Carvedilol) .... Take 1 tablet by mouth two times a day 9)  Diflucan 150 Mg Tabs (Fluconazole) .... Take 1 tablet today 10)  Miconazole Nitrate 2 % Aerp (Miconazole nitrate) .... Apply 2 times per day

## 2010-06-04 NOTE — Progress Notes (Signed)
Summary: diabetes support/dmr  Phone Note Outgoing Call   Call placed by: Jamison Neighbor RD,CDE,  March 02, 2008 1:44 PM Summary of Call: called patient to let her know that her meter appears to be  working now that we replaced the battery and that in the future she can purchase a new battery at [employer]. Will leave her meter at front desk for her to pick up Initial call taken by: Jamison Neighbor RD,CDE,  March 02, 2008 2:13 PM

## 2010-06-04 NOTE — Progress Notes (Signed)
  Phone Note Outgoing Call   Call placed by: Theotis Barrio,  March 26, 2007 4:01 PM Call placed to: Patient Details for Reason: LABS / PICK UP CONTRAST/ CT OF ABD Summary of Call: CALLED PATIENT AND LEFT VOICE MESSAGE FOR PATIENT TO CALL [facility]PC.  APPT FOR CT ABD WITH/ WITH OUT CONTRAST HAS BEEN SCHEDULED FOR Apr 20, 2007 @ 12:00 TO ARRIVE 11:30 AM.  PATIENT NEEDS TO COME IN FOR BMET / AND TO PICK UP HER CONTRAST WHEN SHE COMES IN FOR LABS.  LELA STUDIVANT NTII  Follow-up for Phone Call        She had labs in July...is that not soon enough? Follow-up by: Eliseo Gum MD,  March 26, 2007 4:09 PM  Additional Follow-up for Phone Call Additional follow up Details #1::        Appointment scheduled, Patient called  Pt has been given an appointment for labs on April 16, 2007 at 9:30 am.  She is also aware that she needs to pick up her contrast on that day for her CT scan on April 20, 2007.  Additional Follow-up by: Shon Hough,  March 30, 2007 3:41 PM

## 2010-06-04 NOTE — Progress Notes (Signed)
Summary: diabetes support/dmr  Phone Note Call from Patient Call back at Home Phone [phone]   Caller: Patient Summary of Call: blood sugar has been up more than usual- in the 200s, and she does not know why.  has been checking more and it was 217 this am. down to 110 before errands.  210- 2 hours postmeal, 254 several hours after that and 189 last night- ate mashed potatoes, fried chicken at church. 1- how are they feeling? eyes and ears feel funny( eyes always feel funny when blood sugars up) . change in urination? no  fever? no 2- fluid intake? trying to drink more water, gatorade, trying not to drink regular pepsi, but still drinks them- uses when she doesn't know what she wants to eat or when she has a low blood sugar.  3- medication intake?? hctz, lisinipril, glipizide 10 mg- no missed doses. 4- blood sugars? not usually over 200,  feeling better that they ar now back to 100s.  5- change in food intake? no change- goes a long time without eating - only fries, from [employer] 6- change in activity? trying to go walking but hasn't gotten there yet.   ate breakfast- [employer]jangles- southern fillet- couldn't eat it all. put on pintos beans.  has to have bread with meals.  wnated to know about weight gain with glipizide- discussed improtance of eating less fried foods, serving size of soda and smoothies, and eating small meals and snacks throughout day and not skipping meals.  encouraged patient and suggested she consider attending extreme makeover meeting. Note no insurance or payer source- She could also be referred to [facility] ADA recognized diabetes program if she is interested.  Initial call taken by: Jamison Neighbor RD,CDE,  March 19, 2009 3:14 PM

## 2010-06-04 NOTE — Assessment & Plan Note (Signed)
Summary: CLASS/SB.  Is Patient Diabetic? Yes Did you bring your meter with you today? No   Allergies: 1)  ! Glucophage   Complete Medication List: 1)  Pravachol 40 Mg Tabs (Pravastatin sodium) .... Take 1 tablet by mouth once a day 2)  Zyrtec 10 Mg Tabs (Cetirizine hcl) .... Take 1 tablet by mouth once a day 3)  Glipizide 10 Mg Tabs (Glipizide) .... Take 2 tablets once daily 4)  Aspirin 81 Mg Tbec (Aspirin) .... Take 1 tablet by mouth once a day 5)  Metformin Hcl 850 Mg Tabs (Metformin hcl) .... Take 1 tablet by mouth two times a day 6)  Hydrochlorothiazide 25 Mg Tabs (Hydrochlorothiazide) .... Take 1 tablet by mouth once a day 7)  Lisinopril 40 Mg Tabs (Lisinopril) .... Take 1 tablet by mouth once a day 8)  Coreg 3.125 Mg Tabs (Carvedilol) .... Take 1 tablet by mouth two times a day  Other Orders: DSMT(Medicare) Individual, 30 Minutes (G0108)  Patient Instructions: 1)  At today's visit I identified the follwoing things might help improve my blood sugars: 2)  going to the gym I joined or gettingmore activity 3)   drinking regular soda and eating potato chips 4)   Eating less fast foods/eating food from home or picking healthier foods when i do eat out 5)   Not skipping meals, because then I get really hungry and eat too much at one time (or have  a low blood sugar-remeber being very hungry can come form low blood sugar.It is one of the symptoms- see attached)   6)  I chose to work onthe follwong for the next one week:  7)       goal for next one week is to skip meals less . I currently skip meals about 2-3 times a week. , to do this I will try to carry food with me or eat before I leae home- I want to remember that anyhting is better than nothing when trying not to skip meals.     Diabetes Self Management Training  PCP: [name] Diabetes Type: Type 2 non-insulin Current smoking Status: never  Assessment Special needs or Barriers: a bit tearful today- lost friend  recently- sad because of financila stress=did not get unemployement and felt she deserved it. Eats out but says she cannot afford to buy fruit. Is self defeating inher behaviors. Affect: Sad  Potential Barriers  Economic/Supplies  Support  Low Literacy  Coping Skills  Coping with Diabetes Feelings about Diabetes: Aware Would you say you are: unhappy  Diabetes Medications:  Comments: has been checking blood sugars- mid 100s at home today. did not check here in office today as we had a  brief and interupted visit.She says she will get with Chauncey Reading as she is out of work and having to pay out of pocket for eveyrthign right now.    Monitoring Self monitoring blood glucose 1 time a day  Recent Episodes of: Requiring Help from another person  Hyperglycemia : No Hypoglycemia: No Severe Hypoglycemia : No     Diabetes Disease Process  Discussed today Define diabetes in simple terms: Needs review/assistanceState own type of diabetes: Demonstrates competencyState diabetes is treated by meal plan-exercise-medication-monitoring-education: Demonstrates competency Medications  Nutritional Management Identify what foods most often affect blood glucose: Needs review/assistanceVerbalize importance of controlling food portions: Demonstrates competencyState importance of spacing and not omitting meals and snacks: Needs review/assistance   State changes planned for home meals/snacks: Needs review/assistance  Monitoring State purpose and frequency of monitoring BG-ketones-HgbA1C  : Needs review/assistance   State target blood glucose and HgbA1C goals: Needs review/assistance    Complications State the causes-signs and symptoms and prevention of Hyperglycemia: Needs review/assistance   Explain proper treatment of hyperglycemia: Needs review/assistance   State the causes- signs and symptoms and prevention of hypoglycemia: Needs review/assistance   Explain proper treatment of hypoglycemia:  Needs review/assistance    Exercise  Lifestyle changes:Goal setting and Problem solving State benefits of making appropriate lifestyle changes: Needs review/assistance   Identify lifestyle behaviors that need to change: Demonstrates competency   Identify risk factors that interfere with health: Needs review/assistance   Develop strategies to reduce risk factors: Needs review/assistance    Psychosocial Adjustment Identify two methods to cope with these feelings: Coping SkillsDiabetes Management Education Done: 07/13/2009    BEHAVIORAL GOALS INITIAL Incorporating appropriate nutritional management: see instructions        Very difficult to help patient focus on specific action plan today. agree that starting insulin may be the best plan for her now. If she gets covered by [place] would favor lantus. If not could try [facility] N at bedtime.  Diabetes Self Management Support: need to explore at next visit sin more detail. has family, clinic staff, but may need more, almost tearful today, questions if depressed, does not seem to cope in positive ways Follow-up: next week by Noberto Retort- needs and intends to see if she quslifies for help through financial counselor

## 2010-06-04 NOTE — Assessment & Plan Note (Signed)
Summary: ear pain/gg   Vital Signs:  Patient profile:   65 year old female Height:      59 inches Weight:      171.5 pounds BMI:     34.76 Temp:     98.8 degrees F oral Pulse rate:   102 / minute BP sitting:   151 / 85  (right arm)  Vitals Entered By: Filomena Jungling NT II (March 19, 2010 2:59 PM) CC: EAR PAIN Is Patient Diabetic? Yes Did you bring your meter with you today? No Pain Assessment Patient in pain? yes     Location: EAR Intensity: 8 Type: aching Onset of pain  1 MONTH Nutritional Status BMI of > 30 = obese CBG Result 182  Have you ever been in a relationship where you felt threatened, hurt or afraid?No   Does patient need assistance? Functional Status Self care Ambulation Normal   Diabetic Foot Exam Foot Inspection Is there a history of a foot ulcer?              No Is there a foot ulcer now?              No Can the patient see the bottom of their feet?          Yes Are the shoes appropriate in style and fit?          Yes Is there swelling or an abnormal foot shape?          No Are the toenails long?                No Are the toenails thick?                No Are the toenails ingrown?              No Is there heavy callous build-up?              No Is there pain in the calf muscle (Intermittent claudication) when walking?    NoIs there a claw toe deformity?              No Is there elevated skin temperature?            No Is there limited ankle dorsiflexion?            No Is there foot or ankle muscle weakness?            No  Diabetic Foot Care Education Pulse Check          Right Foot          Left Foot Posterior Tibial:        normal            normal Dorsalis Pedis:        normal            normal    10-g (5.07) Semmes-Weinstein Monofilament Test Performed by: Filomena Jungling NT II          Right Foot          Left Foot Visual Inspection     normal           normal Site 1         normal         normal Site 2         normal         normal Site 3          normal  normal Site 4         normal         normal Site 5         normal         normal Site 6         normal         normal Site 7         normal         normal Site 8         normal         normal Site 9         normal         normal  Impression      normal         normal   Primary Care Provider:  [name]  CC:  EAR PAIN.  History of Present Illness: 65 y/o woman with PMH significant for HTN, Type 2 DM comes to the clinic for a follow up visit.  She complains of ear pain that has been going on for few months. Recently she is having more episodes, in both her ears that she describes as intermittent sharp pain,worst being 10/10 radiateds to her neck. Not assocaited with ear discharge, fever, N/V, decreased hearing/sore throat. She also complains of occassional ringing in her ears.  She did not get her glucometer today.Her blood sugar this afternoon was 220. Her last A1C was 8.1 in June 2011.   Preventive Screening-Counseling & Management  Alcohol-Tobacco     Alcohol drinks/day: 0     Smoking Status: never  Caffeine-Diet-Exercise     Does Patient Exercise: no  Current Medications (verified): 1)  Pravachol 40 Mg Tabs (Pravastatin Sodium) .... Take 1 Tablet By Mouth Once A Day 2)  Zyrtec 10 Mg Tabs (Cetirizine Hcl) .... Take 1 Tablet By Mouth Once A Day 3)  Glipizide 10 Mg  Tabs (Glipizide) .... Take 2 Tablets Once Daily 4)  Aspirin 81 Mg Tbec (Aspirin) .... Take 1 Tablet By Mouth Once A Day 5)  Metformin Hcl 500 Mg Tabs (Metformin Hcl) .... Take 1 Tablet By Mouth Two Times A Day 6)  Hydrochlorothiazide 25 Mg Tabs (Hydrochlorothiazide) .... Take 1 Tablet By Mouth Once A Day 7)  Lisinopril 40 Mg Tabs (Lisinopril) .... Take 1 Tablet By Mouth Once A Day  Allergies (verified): 1)  ! Glucophage  Review of Systems      See HPI  The patient denies anorexia, fever, decreased hearing, hoarseness, chest pain, syncope, dyspnea on exertion, and peripheral  edema.    Physical Exam  General:  alert, obese, and well-developed.   Head:  normocephalic and atraumatic.   Eyes:  vision grossly intact, pupils equal, and pupils round.   Ears:  TM normal in appearence, no ear discharge, no external deformities. Mouth:  pharynx pink and moist.   Neck:  supple, full ROM, and no masses.   Lungs:  normal respiratory effort, no intercostal retractions, no accessory muscle use, normal breath sounds, no dullness, no fremitus, and no crackles.   Heart:  normal rate, regular rhythm, no murmur, no gallop, no rub, and no JVD.   Abdomen:  soft, non-tender, normal bowel sounds, no distention, no masses, and no guarding.   Msk:  normal ROM, no joint tenderness, no joint swelling, no joint warmth, and no redness over joints.   Pulses:  2+pulsesb/l. Extremities:  no cyanosis, no clubbing, no pedal edema Neurologic:  alert & oriented  X3, cranial nerves II-XII intact, strength normal in all extremities, sensation intact to light touch, sensation intact to pinprick, and DTRs symmetrical and normal.    Diabetes Management Exam:    Foot Exam (with socks and/or shoes not present):       Sensory-Monofilament:          Left foot: normal          Right foot: normal   Impression & Recommendations:  Problem # 1:  EAR PAIN, BILATERAL (ICD-388.70) Assessment Unchanged She complains of waxing and waning pain in her ears that has been going on for a year, getting wore recently. The pain  can occurs in either of her ears , radiates down to her neck. Denies associated ear discharge, decreased hearing, sore throat, nasal congestion, fever, N/V. Though she reports occasional ringing in her ears but is not related to her ear pain. She reports that she has two cracked teeth. This might be dental pain referred to the ear. We advised her to f/u with her dentist to get her teeth treated which might take care of her ear pain.  Problem # 2:  DIABETES MELLITUS, TYPE II  (ICD-250.00) Assessment: Improved Her A1C today is 7.5 which has improved from her last time , 8.1(June 2011). She says that she was seen by her eye doctor in the beginning of this year .We adviced her to exercise, reduce some weight and eat carb modified diet. Her updated medication list for this problem includes:    Glipizide 10 Mg Tabs (Glipizide) .Marland Kitchen... Take 2 tablets once daily    Aspirin 81 Mg Tbec (Aspirin) .Marland Kitchen... Take 1 tablet by mouth once a day    Metformin Hcl 500 Mg Tabs (Metformin hcl) .Marland Kitchen... Take 1 tablet by mouth two times a day    Lisinopril 40 Mg Tabs (Lisinopril) .Marland Kitchen... Take 1 tablet by mouth once a day  Orders: T-Capillary Blood Glucose (82948) T- Hemoglobin A1C (62130-86578) T-Urine Microalbumin w/creat. ratio [phone])  Problem # 3:  HYPERTENSION (ICD-401.9) Assessment: Comment Only Her BP is 153/91 but she reports missing her morning dose of BP med. Will continue her on the same regimen. Her updated medication list for this problem includes:    Hydrochlorothiazide 25 Mg Tabs (Hydrochlorothiazide) .Marland Kitchen... Take 1 tablet by mouth once a day    Lisinopril 40 Mg Tabs (Lisinopril) .Marland Kitchen... Take 1 tablet by mouth once a day  Complete Medication List: 1)  Pravachol 40 Mg Tabs (Pravastatin sodium) .... Take 1 tablet by mouth once a day 2)  Zyrtec 10 Mg Tabs (Cetirizine hcl) .... Take 1 tablet by mouth once a day 3)  Glipizide 10 Mg Tabs (Glipizide) .... Take 2 tablets once daily 4)  Aspirin 81 Mg Tbec (Aspirin) .... Take 1 tablet by mouth once a day 5)  Metformin Hcl 500 Mg Tabs (Metformin hcl) .... Take 1 tablet by mouth two times a day 6)  Hydrochlorothiazide 25 Mg Tabs (Hydrochlorothiazide) .... Take 1 tablet by mouth once a day 7)  Lisinopril 40 Mg Tabs (Lisinopril) .... Take 1 tablet by mouth once a day  Patient Instructions: 1)  Please  take your medicines as prescribed. 2)  Please schedule a follow-up appointment in 2 months.   Orders Added: 1)  T-Capillary  Blood Glucose [82948] 2)  T- Hemoglobin A1C [83036-23375] 3)  T-Urine Microalbumin w/creat. ratio [[id number]]    Laboratory Results   Blood Tests   Date/Time Received: March 19, 2010 3:59 PM Date/Time Reported: Burke Keels  March 19, 2010 3:59 PM   HGBA1C: 7.5%   (Normal Range: Non-Diabetic - 3-6%   Control Diabetic - 6-8%) CBG Random:: 182mg /dL      Process Orders Check Orders Results:     [facility]: Order checked:      -- T-Capillary Blood Glucose --  NO TEST CODE (CPT: ) Order queued for requisitioning for [facility]: March 19, 2010 4:12 PM Tests Sent for requisitioning (March 19, 2010 4:13 PM):     03/19/2010: [facility] -- T-Capillary Blood Glucose [[id number]] (signed)     03/19/2010: [facility] -- T- Hemoglobin A1C [[id number]] (signed)     03/19/2010: [facility] -- T-Urine Microalbumin w/creat. ratio [[id number]] (signed)     Prevention & Chronic Care Immunizations   Influenza vaccine: Fluvax Non-MCR  (01/23/2010)   Influenza vaccine deferral: Not indicated  (08/14/2009)    Tetanus booster: 12/29/2008: Td    Pneumococcal vaccine: Pneumovax  (01/23/2010)    H. zoster vaccine: Not documented   H. zoster vaccine deferral: Not indicated  (08/14/2009)  Colorectal Screening   Hemoccult: Not documented   Hemoccult action/deferral: Not indicated  (08/14/2009)    Colonoscopy: Results: Polyp.  Awaiting result   (10/24/2009)   Colonoscopy action/deferral: Further recommendations pending biopsy results.    (10/24/2009)   Colonoscopy due: 10/2009  Other Screening   Pap smear: Not documented   Pap smear action/deferral: Not indicated S/P hysterectomy  (07/03/2009)    Mammogram: ASSESSMENT: Negative - BI-RADS 1^MS DIGITAL SCREENING  (08/30/2009)   Mammogram action/deferral: Screening mammogram in 1 year.     (08/26/2007)   Mammogram due: 08/20/2009    DXA bone  density scan: Not documented   Smoking status: never  (03/19/2010)  Diabetes Mellitus   HgbA1C: 7.5  (03/19/2010)    Eye exam: No diabetic retinopathy.     (10/27/2008)   Eye exam due: 11/2009    Foot exam: yes  (03/19/2010)   Foot exam action/deferral: Do today   High risk foot: Not documented   Foot care education: Done  (08/14/2009)    Urine microalbumin/creatinine ratio: 11.0  (10/04/2008)  Lipids   Total Cholesterol: 144  (08/14/2009)   Lipid panel action/deferral: Lipid Panel ordered   LDL: 83  (08/14/2009)   LDL Direct: Not documented   HDL: 36  (08/14/2009)   Triglycerides: 124  (08/14/2009)    SGOT (AST): 14  (01/23/2010)   BMP action: Ordered   SGPT (ALT): 21  (01/23/2010)   Alkaline phosphatase: 79  (01/23/2010)   Total bilirubin: 0.5  (01/23/2010)  Hypertension   Last Blood Pressure: 151 / 85  (03/19/2010)   Serum creatinine: 0.77  (01/23/2010)   BMP action: Ordered   Serum potassium 4.1  (01/23/2010)  Self-Management Support :   Personal Goals (by the next clinic visit) :     Personal A1C goal: 7  (01/31/2010)     Personal blood pressure goal: 130/80  (01/31/2010)     Personal LDL goal: 70  (01/31/2010)    Diabetes self-management support: Written self-care plan  (01/31/2010)   Last diabetes self-management training by diabetes educator: 07/13/2009    Hypertension self-management support: Written self-care plan  (01/31/2010)    Lipid self-management support: Written self-care plan  (01/31/2010)    Nursing Instructions: Diabetic foot exam today

## 2010-06-04 NOTE — Assessment & Plan Note (Signed)
Summary: Inpatient A1C/dmr

## 2010-06-04 NOTE — Assessment & Plan Note (Signed)
Summary: (ACUTE-[facility])PER GAYLE LEFT EYE REDNESS/CH   Vital Signs:  Patient Profile:   65 Years Old Female Height:     59 inches (149.86 cm) Weight:      174 pounds BMI:     35.27 BSA:     1.74 Temp:     97.5 degrees F oral Pulse rate:   82 / minute Resp:     18 per minute BP sitting:   148 / 82  (left arm)  Pt. in pain?   no               Does patient need assistance? Functional Status Self care Ambulation Normal     PCP:  Eliseo Gum MD  Chief Complaint:  redness and itching to left eye.  History of Present Illness: Ms [name] is a 65 yo woman who walks in today complaining of redness and itching of left eye. Started yesterday morning. No foreign body or trauma to eye. No sick contacts. No purulent drainage. No vision loss, headache, nausea/vomiting. Tried to treat with Visine drops.    Current Allergies: ! GLUCOPHAGE     Review of Systems  Eyes      Complains of discharge, itching, and red eye.      Denies blurring, double vision, eye pain, light sensitivity, and vision loss-1 eye.      watery discharge   Physical Exam  Eyes:     vision grossly intact, pupils equal, pupils round, pupils reactive to light, no iris abnormalities. There is conjunctival injection. Minimal amount of watery discharge. Fluorescein exam negative for foreign body.      Impression & Recommendations:  Problem # 1:  CONJUNCTIVITIS, VIRAL, LEFT (ICD-077.99) No signs of bacterial infection. Explained to patient that there was no tx and that eye would get better in 3-4 days. Told her not to use any OTC meds including Visine drops. Went over warning signs with patient including vision loss, pain and purulent discharge and instructed her to come to ED over the week-end if she does (today is Friday). Will call on Monday (6/15) to check on her.  Problem # 2:  DIABETES MELLITUS, TYPE II (ICD-250.00) Last A1C was 8.2 on 08/31/07. At that time, pt had only been taking her Metformin once  daily instead of two times a day because of cost. States she is now taking meds as directed. Will schedule her to see her PCP ASAP. Her updated medication list for this problem includes:    Glipizide 10 Mg Tabs (Glipizide) .Marland Kitchen... Take 2 tablets once daily    Aspirin 81 Mg Tbec (Aspirin) .Marland Kitchen... Take 1 tablet by mouth once a day    Metformin Hcl 500 Mg Tabs (Metformin hcl) .Marland Kitchen... Take 1 tablet by mouth two times a day    Lisinopril-hydrochlorothiazide 20-25 Mg Tabs (Lisinopril-hydrochlorothiazide) .Marland Kitchen... Take 1 tablet by mouth once a day   Problem # 3:  HYPERTENSION (ICD-401.9) BP regimen changed at last visit. Improved but still not optimally controlled. Also, was supposed to return for BMET in 5/09 but didn't. Will check today. As above, follow up PCP next available. Her updated medication list for this problem includes:    Lisinopril-hydrochlorothiazide 20-25 Mg Tabs (Lisinopril-hydrochlorothiazide) .Marland Kitchen... Take 1 tablet by mouth once a day   Complete Medication List: 1)  Lovastatin 40 Mg Tabs (Lovastatin) .... Take 1 tablet by mouth once a day 2)  Zyrtec 10 Mg Tabs (Cetirizine hcl) .... Take 1 tablet by mouth once a day 3)  Glipizide 10  Mg Tabs (Glipizide) .... Take 2 tablets once daily 4)  Aspirin 81 Mg Tbec (Aspirin) .... Take 1 tablet by mouth once a day 5)  Metformin Hcl 500 Mg Tabs (Metformin hcl) .... Take 1 tablet by mouth two times a day 6)  Constulose 10 Gm/45ml Soln (Lactulose) .... Take 15 ml twice a day for constipation as needed. 7)  Lisinopril-hydrochlorothiazide 20-25 Mg Tabs (Lisinopril-hydrochlorothiazide) .... Take 1 tablet by mouth once a day  Other Orders: T-Basic Metabolic Panel [id number])   Patient Instructions: 1)  Please schedule an appointment with Dr Okey Dupre in next available slot. 2)  Your red eye is most likely caused by a virus and will start getting better in 3-4 days. 3)  Do not put any over-the-counter medicines, including eye drops, in the eye. 4)  Over  this week-end, if your eye becomes painful, if you have loss of vision or if you notice  pus draining from the eye, come to the Emergency Department. 5)  We will call you Monday to make sure you are doing ok.    ]  Appended Document: (ACUTE-[facility])PER GAYLE LEFT EYE REDNESS/CH   Impression & Recommendations:  Problem # 1:  HYPERTENSION (ICD-401.9) K 3.6 and Creat 0.81 on ACE & HCTZ. Continue. Her updated medication list for this problem includes:    Lisinopril-hydrochlorothiazide 20-25 Mg Tabs (Lisinopril-hydrochlorothiazide) .Marland Kitchen... Take 1 tablet by mouth once a day   Complete Medication List: 1)  Lovastatin 40 Mg Tabs (Lovastatin) .... Take 1 tablet by mouth once a day 2)  Zyrtec 10 Mg Tabs (Cetirizine hcl) .... Take 1 tablet by mouth once a day 3)  Glipizide 10 Mg Tabs (Glipizide) .... Take 2 tablets once daily 4)  Aspirin 81 Mg Tbec (Aspirin) .... Take 1 tablet by mouth once a day 5)  Metformin Hcl 500 Mg Tabs (Metformin hcl) .... Take 1 tablet by mouth two times a day 6)  Constulose 10 Gm/33ml Soln (Lactulose) .... Take 15 ml twice a day for constipation as needed. 7)  Lisinopril-hydrochlorothiazide 20-25 Mg Tabs (Lisinopril-hydrochlorothiazide) .... Take 1 tablet by mouth once a day

## 2010-06-04 NOTE — Miscellaneous (Signed)
Summary: HIPPA  HIPPA   Imported By: Gentry Fitz 07/31/2008 15:26:31  _____________________________________________________________________  External Attachment:    Type:   Image     Comment:   External Document

## 2010-06-04 NOTE — Assessment & Plan Note (Signed)
Summary: F/U APPT PER PT/CFB   Vital Signs:  Patient Profile:   65 Years Old Female Height:     59 inches (149.86 cm) Weight:      173.3 pounds (78.77 kg) BMI:     35.13 Temp:     97.3 degrees F (36.28 degrees C) oral Pulse rate:   93 / minute BP sitting:   133 / 74  (right arm)  Pt. in pain?   no  Vitals Entered By: [name] NT II (March 02, 2008 10:48 AM)              Is Patient Diabetic? Yes Did you bring your meter with you today? Yes Nutritional Status BMI of 25 - 29 = overweight  Have you ever been in a relationship where you felt threatened, hurt or afraid?No   Does patient need assistance? Functional Status Self care Ambulation Normal     PCP:  Eliseo Gum MD  Chief Complaint:  FOLLOW-UP VISIT.  History of Present Illness: [name] is a 65 yo woman with PMH as outlined in chart.  She is here for routine follow up.  She has no current complaints.   She has her eye exam next month, Dr. Mitzi Davenport on [address].      Prior Medications Reviewed Using: Patient Recall  Prior Medication List:  LOVASTATIN 20 MG TABS (LOVASTATIN) Take 1 tab by mouth at bedtime ZYRTEC 10 MG TABS (CETIRIZINE HCL) Take 1 tablet by mouth once a day GLIPIZIDE 10 MG  TABS (GLIPIZIDE) Take 2 tablets once daily ASPIRIN 81 MG TBEC (ASPIRIN) Take 1 tablet by mouth once a day METFORMIN HCL 500 MG  TABS (METFORMIN HCL) Take 1 tablet by mouth two times a day CONSTULOSE 10 GM/15ML  SOLN (LACTULOSE) take 15 ml twice a day for constipation as needed. HYDROCHLOROTHIAZIDE 25 MG TABS (HYDROCHLOROTHIAZIDE) Take 1 tablet by mouth once a day LISINOPRIL 40 MG TABS (LISINOPRIL) Take 1 tablet by mouth once a day   Current Allergies: ! GLUCOPHAGE  Past Medical History:    Reviewed history from 03/25/2007 and no changes required:       Chest Pain 12/07       - Cardiology inpatient referral, Dr. [facility]       - Negative Tc-99 Myoview, EF 70% 04/14/06        - Believed to be  2/2 GI as relieved by PPI       GERD vs Gastritis (12/07)       Allergic rhinitis       Anemia-NOS-normocytic. no further w/u done.       Diabetes mellitus, type II       Hyperlipidemia       Hypertension - dx 1995       nonalcoholic fatty liver disease 2/2 wt and DM- Abd US1/06, MRI2/06       -  5 liver lesions from fatty infiltrate and likely regenerationg nodules CT 2/06; MRI 2/06, F/U W/ CONTRAST CT       -  Mann to f.u?; nl alpha-fetoprotein 2/06       hx of hyperkalemia, further details not known.       obesity-BMI 35.5 (4/06)       renal lesion- right lower pole - 2/06       recurrent vaginal Candidiasis       Sleep disturbances       -  periodic limb movement- sleep study 5/05       -  Bruxism (grinding teeth)-sleep study 5/05       -  occasional sleep disorder breathing with RDI 2.2       skin tags external auditory canal, S/P removal 4/06       hemorrhoids, small, internal and external. no polyps-Colonoscopy, Dr. Loreta Ave- 8/06;9/00       Otitis Media- bilateral 2/03       RUQ abd pain- neg HIDA 1/06       Osteoarthritis, bilateral knee (see PSH)       - R knee arthroscopy       - L knee moderative tricompartmental disease (4 view X-ray 9/07)     Risk Factors: Tobacco use:  never Drug use:  no Alcohol use:  no Exercise:  no Seatbelt use:  100 %  Colonoscopy History:    Date of Last Colonoscopy:  09/23/2004  Mammogram History:    Date of Last Mammogram:  08/26/2007   Review of Systems  General      Denies fever, loss of appetite, and weakness.  CV      Complains of shortness of breath with exertion.      Denies chest pain or discomfort, difficulty breathing at night, and difficulty breathing while lying down.      some SOB with exertion, but not abnormal for her and able to walk just under 1 mile.  Resp      Denies cough and shortness of breath.  GI      Denies abdominal pain, bloody stools, change in bowel habits, and dark tarry stools.  GU      Denies  dysuria and hematuria.  Neuro      Denies numbness, tingling, visual disturbances, and weakness.   Physical Exam  General:     alert, well-developed, well-nourished, and well-hydrated.   Head:     normocephalic and atraumatic.   Eyes:     anicteric Mouth:     poor dentition.  OP clear Neck:     supple, no masses, no thyromegaly, no JVD, no carotid bruits, and no cervical lymphadenopathy.   Lungs:     normal respiratory effort, no accessory muscle use, normal breath sounds, no crackles, and no wheezes.   Heart:     normal rate, regular rhythm, no gallop, no rub, and no JVD.   Grade 1/6 SEM over outflow track Abdomen:     normal bowel sounds.   Extremities:     no edema Neurologic:     alert & oriented X3, cranial nerves II-XII intact, strength normal in all extremities, sensation intact to light touch, and gait normal.   Cervical Nodes:     no anterior cervical adenopathy and no posterior cervical adenopathy.   Axillary Nodes:     no R axillary adenopathy and no L axillary adenopathy.   Psych:     Oriented X3, memory intact for recent and remote, normally interactive, good eye contact, not anxious appearing, and not depressed appearing.      Impression & Recommendations:  Problem # 1:  HYPERTENSION (ICD-401.9) BP improved with increase in ACEI.  Will not make any changes until few points on curve obtained.  Will recheck during next visit, she is virtually at goal this visit.  Will check BMET to assess Cr and K during next visit (ordered) given her increase in ACEI last visit.  Her updated medication list for this problem includes:    Hydrochlorothiazide 25 Mg Tabs (Hydrochlorothiazide) .Marland Kitchen... Take 1 tablet by mouth once a day  Lisinopril 40 Mg Tabs (Lisinopril) .Marland Kitchen... Take 1 tablet by mouth once a day  Future Orders: T-Basic Metabolic Panel [id number]) ... 04/04/2008  BP today: 133/74 Prior BP: 167/89 (01/04/2008)  Prior 10 Yr Risk Heart Disease: Not enough  information (07/09/2006)  Labs Reviewed: Creat: 0.72 (01/04/2008) Chol: 172 (01/04/2008)   HDL: 40 (01/04/2008)   LDL: 111 (01/04/2008)   TG: 103 (01/04/2008)   Problem # 2:  DIABETES MELLITUS, TYPE II (ICD-250.00) A1c not at goal but improved.  She is due for her next A1c in December (ordered).  Her meter had not been working since last download, therefore have no measurements.  I have given it to Lupita Leash, whom has fixed the problem.  She will call pt to have her pick it up.  Will download meter and check A1c during next visit and consider adjustments at that time.  She has had microalbumin/cr ration within last year and is already on ACEI. Her updated medication list for this problem includes:    Glipizide 10 Mg Tabs (Glipizide) .Marland Kitchen... Take 2 tablets once daily    Aspirin 81 Mg Tbec (Aspirin) .Marland Kitchen... Take 1 tablet by mouth once a day    Metformin Hcl 500 Mg Tabs (Metformin hcl) .Marland Kitchen... Take 1 tablet by mouth two times a day    Lisinopril 40 Mg Tabs (Lisinopril) .Marland Kitchen... Take 1 tablet by mouth once a day  Future Orders: T-Hgb A1C (in-house) ([id number]) ... 04/04/2008  Labs Reviewed: HgBA1c: 7.7 (01/04/2008)   Creat: 0.72 (01/04/2008)   Microalbumin: 0.85 (08/31/2007)   Problem # 3:  HYPERLIPIDEMIA (ICD-272.4) Not at goal for diabetic, however, this was addressed during the last visit (please refer for details).  She was changed to lovastatin 20mg  during last visit 2/2 cost.  Will repeat FLP during next visit in decemeber to assess response.  If she is not near LDL goal (about 70), would either change to pravachol 40mg  (on $4 list) or see if Marcelle Smiling would be able to obtain lipitor 40mg  for her.   Her updated medication list for this problem includes:    Lovastatin 20 Mg Tabs (Lovastatin) .Marland Kitchen... Take 1 tab by mouth at bedtime  Future Orders: T-Lipid Profile ([id number]) ... 04/04/2008  Labs Reviewed: Chol: 172 (01/04/2008)   HDL: 40 (01/04/2008)   LDL: 111 (01/04/2008)   TG: 103 (01/04/2008) SGOT:  17 (01/04/2008)   SGPT: 25 (01/04/2008)  Prior 10 Yr Risk Heart Disease: Not enough information (07/09/2006)   Problem # 4:  Preventive Health Care (ICD-V70.0) Mammogram done earlier this year, next due 08/2008 Colonoscopy done 2006, next due 2011 Eye exam scheduled for 03/2008, need to follow up on results.  Being done by Dr. Mitzi Davenport on [address]. Advised on flu vaccine but pt does not wish to have it this year.  Complete Medication List: 1)  Lovastatin 20 Mg Tabs (Lovastatin) .... Take 1 tab by mouth at bedtime 2)  Zyrtec 10 Mg Tabs (Cetirizine hcl) .... Take 1 tablet by mouth once a day 3)  Glipizide 10 Mg Tabs (Glipizide) .... Take 2 tablets once daily 4)  Aspirin 81 Mg Tbec (Aspirin) .... Take 1 tablet by mouth once a day 5)  Metformin Hcl 500 Mg Tabs (Metformin hcl) .... Take 1 tablet by mouth two times a day 6)  Constulose 10 Gm/76ml Soln (Lactulose) .... Take 15 ml twice a day for constipation as needed. 7)  Hydrochlorothiazide 25 Mg Tabs (Hydrochlorothiazide) .... Take 1 tablet by mouth once a day 8)  Lisinopril 40 Mg  Tabs (Lisinopril) .... Take 1 tablet by mouth once a day  Other Orders: Future Orders: T-CBC w/Diff ([id number]) ... 04/04/2008   Patient Instructions: 1)  Please schedule a follow-up appointment in 2 months, December, and schedule with me if possible. 2)  Continue all your current medications. 3)  Will check with Scherrie Gerlach regarding your meter.  4)  Will also check with Rudell Cobb regarding help with new meter if needed.  5)  If you have any problems before your next visit, call clinic.    ]

## 2010-06-04 NOTE — Miscellaneous (Signed)
Summary: Vaccine Record  Vaccine Record   Imported By: Raynaldo Opitz 03/11/2006 18:20:06  _____________________________________________________________________  External Attachment:    Type:   Image     Comment:   External Document

## 2010-06-04 NOTE — Letter (Signed)
Summary: [facility]  [facility]   Imported By: Margie Billet 08/30/2009 10:30:33  _____________________________________________________________________  External Attachment:    Type:   Image     Comment:   External Document

## 2010-06-04 NOTE — Assessment & Plan Note (Signed)
Summary: FU OV/[place]/VS   Vital Signs:  Patient Profile:   65 Years Old Female Height:     59 inches (149.86 cm) Weight:      182.4 pounds (82.91 kg) BMI:     36.97 Temp:     97.8 degrees F (36.56 degrees C) oral Pulse rate:   71 / minute BP sitting:   135 / 74  (right arm)  Pt. in pain?   yes    Location:   left side    Intensity:   1  Vitals Entered By: Krystal Eaton Duncan Dull) (July 21, 2007 9:02 AM)              Is Patient Diabetic? Yes  Nutritional Status BMI of > 30 = obese  Have you ever been in a relationship where you felt threatened, hurt or afraid?No   Does patient need assistance? Functional Status Self care Ambulation Normal     PCP:  Eliseo Gum MD  Chief Complaint:  f/u left side pain "feeling much better"  and xray results.  History of Present Illness: 65 year old female with PMH significant for DM, regenaration liver nodule ( CT abdomen 12- 2008) , HTN, Hyperlipidemia who presents for follow up of LLQ abdominal pain. She relates that the LLQ abdominal pain is better, she has the pain occasionally. She relates that the tramadol is helping with the pain, and the lactulose with constipation. She now presents complaining of  RUQ abdominal pain that started 1 day prior to this visit, the pain start after she eat. She relates prior episode last week, but she ignored it. She denies SOB, vomiting, diarrhea, fever.    Prior Medication List:  ATENOLOL 50 MG TABS (ATENOLOL) Take 1 tablet by mouth once a day MAXZIDE-25 37.5-25 MG TABS (TRIAMTERENE-HCTZ) Take 1 tablet by mouth once a day LOVASTATIN 40 MG TABS (LOVASTATIN) Take 1 tablet by mouth once a day ZYRTEC 10 MG TABS (CETIRIZINE HCL) Take 1 tablet by mouth once a day GLIPIZIDE 10 MG  TABS (GLIPIZIDE) Take 2 tablets once daily ASPIRIN 81 MG TBEC (ASPIRIN) Take 1 tablet by mouth once a day MELOXICAM 15 MG TABS (MELOXICAM) as directed as needed for arthritis pain. METFORMIN HCL 500 MG  TABS (METFORMIN  HCL) Take 1 tablet by mouth two times a day CONSTULOSE 10 GM/15ML  SOLN (LACTULOSE) take 15 ml twice a day for constipation as needed. ULTRAM 50 MG  TABS (TRAMADOL HCL) take 1 tab every 6 hr for pain as needed   Updated Prior Medication List: ATENOLOL 50 MG TABS (ATENOLOL) Take 1 tablet by mouth once a day MAXZIDE-25 37.5-25 MG TABS (TRIAMTERENE-HCTZ) Take 1 tablet by mouth once a day LOVASTATIN 40 MG TABS (LOVASTATIN) Take 1 tablet by mouth once a day ZYRTEC 10 MG TABS (CETIRIZINE HCL) Take 1 tablet by mouth once a day GLIPIZIDE 10 MG  TABS (GLIPIZIDE) Take 2 tablets once daily ASPIRIN 81 MG TBEC (ASPIRIN) Take 1 tablet by mouth once a day METFORMIN HCL 500 MG  TABS (METFORMIN HCL) Take 1 tablet by mouth two times a day CONSTULOSE 10 GM/15ML  SOLN (LACTULOSE) take 15 ml twice a day for constipation as needed. ULTRAM 50 MG  TABS (TRAMADOL HCL) take 1 tab every 6 hr for pain as needed  Current Allergies (reviewed today): ! GLUCOPHAGE    Risk Factors: Tobacco use:  never Drug use:  no Alcohol use:  no Exercise:  no Seatbelt use:  100 %    Physical Exam  General:     alert, well-developed, well-nourished, well-hydrated, and overweight-appearing.   Head:     normocephalic, atraumatic, and no abnormalities observed.   Eyes:     vision grossly intact, pupils equal, pupils round, and pupils reactive to light.   Ears:     R ear normal and L ear normal.   Neck:     supple and no masses.   Lungs:     normal respiratory effort, no intercostal retractions, no accessory muscle use, and normal breath sounds.   Heart:     normal rate, regular rhythm, and no murmur.   Abdomen:     soft, normal bowel sounds, no distention, no masses, no guarding, no rigidity, and no rebound tenderness.  Mild tenderness in RUQ. Cervical Nodes:     no anterior cervical adenopathy and no posterior cervical adenopathy.      Impression & Recommendations:  Problem # 1:  ABDOMINAL PAIN, LEFT LOWER  QUADRANT (ICD-789.04) This can be secondary to muscle spasm, vs constipation.  I think this was most likely secondary to muscle spasm. The UA was negative for infection, cristal or  hematuria (for kidney stone). The abdominal X ray was negative for obstruction, perforation, small amount of stool was visualized. The patient relates that the abdominal pain is better, she has the pain occasionally and is worse with movement and has an intensity of 1/10. I will continue with tramadol for pain and I told her to use it as needed. Her updated medication list for this problem includes:    Reglan 5 Mg Tabs (Metoclopramide hcl) .Marland Kitchen... Take 1 tab 30 mint before meals.   Problem # 2:  ABDOMINAL PAIN RIGHT UPPER QUADRANT (ICD-789.01) Ms. Maret now presents complaining of RUQ abdominal pain that started 1 day prior to this visit, she notice that the pain is worse with meals.  She relates prior episode last week. This can be secondary to biliary colic ( will get LFT, CBC) VS gastroparesis (likely with her history of DM) Vs gastritis or PUD. If she continue to have abdominal pain will consider endoscopy. I will order CT abdomen to evaluate prior liver nodule, and evaluate GB. I will prescribe reglan, this can be stop if patient doesn't notice improvement.  Her updated medication list for this problem includes:    Reglan 5 Mg Tabs (Metoclopramide hcl) .Marland Kitchen... Take 1 tab 30 mint before meals.   Problem # 3:  HYPERTENSION (ICD-401.9) BP better on this visit now that the abdominal pain improved. ( BP 135/75 almost at goal ) Continue with current med. I will also check Cr, and K leve , Prior labs ( Cr.: 0.93 K: 3.9 on Dec 2008)  Her updated medication list for this problem includes:    Atenolol 50 Mg Tabs (Atenolol) .Marland Kitchen... Take 1 tablet by mouth once a day    Maxzide-25 37.5-25 Mg Tabs (Triamterene-hctz) .Marland Kitchen... Take 1 tablet by mouth once a day   Complete Medication List: 1)  Atenolol 50 Mg Tabs (Atenolol) .... Take  1 tablet by mouth once a day 2)  Maxzide-25 37.5-25 Mg Tabs (Triamterene-hctz) .... Take 1 tablet by mouth once a day 3)  Lovastatin 40 Mg Tabs (Lovastatin) .... Take 1 tablet by mouth once a day 4)  Zyrtec 10 Mg Tabs (Cetirizine hcl) .... Take 1 tablet by mouth once a day 5)  Glipizide 10 Mg Tabs (Glipizide) .... Take 2 tablets once daily 6)  Aspirin 81 Mg Tbec (Aspirin) .... Take 1 tablet by mouth  once a day 7)  Metformin Hcl 500 Mg Tabs (Metformin hcl) .... Take 1 tablet by mouth two times a day 8)  Constulose 10 Gm/50ml Soln (Lactulose) .... Take 15 ml twice a day for constipation as needed. 9)  Ultram 50 Mg Tabs (Tramadol hcl) .... Take 1 tab every 6 hr for pain as needed 10)  Reglan 5 Mg Tabs (Metoclopramide hcl) .... Take 1 tab 30 mint before meals.  Other Orders: T-Comprehensive Metabolic Panel [id number]) T-CBC w/Diff ([id number]) CT with Contrast (CT w/ contrast)   Patient Instructions: 1)  Please schedule a follow-up appointment in 1 month.    Prescriptions: REGLAN 5 MG  TABS (METOCLOPRAMIDE HCL) take 1 tab 30 mint before meals.  #90 x 0   Entered and Authorized by:   Hartley Barefoot MD   Signed by:   Hartley Barefoot MD on 07/21/2007   Method used:   Electronically sent to ...       [address]*       [address]       [place], [place]  [address]       Ph: [phone]       Fax: [phone]   RxID:   [id number]  ]

## 2010-06-04 NOTE — Progress Notes (Signed)
Summary: refill/gg  Phone Note Call from Patient   Summary of Call: Pt called and was seen on 4/12 for yeast infection.  She was treated with DIFLUCAN 150 MG TABS (FLUCONAZOLE) take 1 tablet today  #1 x 0 and was told to call back if not improved.  She is not better and would like a refill. Initial call taken by: Merrie Roof RN,  Sep 03, 2009 4:31 PM  Follow-up for Phone Call        completed refill, thank you Iskra  Follow-up by: Mliss Sax MD,  Sep 04, 2009 9:40 PM    Prescriptions: DIFLUCAN 150 MG TABS (FLUCONAZOLE) take 1 tablet today  #1 x 0   Entered and Authorized by:   Mliss Sax MD   Signed by:   Mliss Sax MD on 09/04/2009   Method used:   Electronically to        [facility]  [address] [phone]* (retail)       [address]       [place], [place]  [address]       Ph: [phone]       Fax: [phone]   RxID:   [id number]

## 2010-06-04 NOTE — Assessment & Plan Note (Signed)
Summary: FU/SB.   Vital Signs:  Patient profile:   65 year old female Height:      59 inches (149.86 cm) Weight:      168.9 pounds (76.77 kg) BMI:     34.24 Temp:     98.0 degrees F oral Pulse rate:   81 / minute BP sitting:   153 / 83  (left arm)  Vitals Entered By: Chinita Pester RN (January 31, 2010 1:35 PM) CC: F/U visit to recheck BP; HCTZ was stopped at last visit per pt. Is Patient Diabetic? Yes Did you bring your meter with you today? No Pain Assessment Patient in pain? no      Nutritional Status BMI of > 30 = obese CBG Result 157  Have you ever been in a relationship where you felt threatened, hurt or afraid?No   Does patient need assistance? Functional Status Self care Ambulation Normal   Primary Care Provider:  [name]  CC:  F/U visit to recheck BP; HCTZ was stopped at last visit per pt..  History of Present Illness: Pt is a 65 y/o F presenting for BP recheck. At last clinic visit BP was soft at 107 and pt was instructed to stop taking HCTZ. Of note Coreg was on pt's med list in EMR but patient states she has not been taking this for a while now. When asked why, she states she feels like she does not need it.   Patient has no other acute complaints or concerns.    Depression History:      The patient denies a depressed mood most of the day and a diminished interest in her usual daily activities.         Preventive Screening-Counseling & Management  Alcohol-Tobacco     Alcohol drinks/day: 0     Smoking Status: never  Caffeine-Diet-Exercise     Does Patient Exercise: no  Current Medications (verified): 1)  Pravachol 40 Mg Tabs (Pravastatin Sodium) .... Take 1 Tablet By Mouth Once A Day 2)  Zyrtec 10 Mg Tabs (Cetirizine Hcl) .... Take 1 Tablet By Mouth Once A Day 3)  Glipizide 10 Mg  Tabs (Glipizide) .... Take 2 Tablets Once Daily 4)  Aspirin 81 Mg Tbec (Aspirin) .... Take 1 Tablet By Mouth Once A Day 5)  Metformin Hcl 500 Mg Tabs  (Metformin Hcl) .... Take 1 Tablet By Mouth Two Times A Day 6)  Hydrochlorothiazide 25 Mg Tabs (Hydrochlorothiazide) .... Stop Taking Your Medication Until Your Next Appointment 7)  Lisinopril 40 Mg Tabs (Lisinopril) .... Take 1 Tablet By Mouth Once A Day  Allergies (verified): 1)  ! Glucophage  Past History:  Past Medical History: Last updated: 03/25/2007 Chest Pain 12/07 - Cardiology inpatient referral, Dr. Aubery [facility] - Negative Tc-99 Myoview, EF 70% 04/14/06  - Believed to be 2/2 GI as relieved by PPI GERD vs Gastritis (12/07) Allergic rhinitis Anemia-NOS-normocytic. no further w/u done. Diabetes mellitus, type II Hyperlipidemia Hypertension - dx 1995 nonalcoholic fatty liver disease 2/2 wt and DM- Abd US1/06, MRI2/06 -  5 liver lesions from fatty infiltrate and likely regenerationg nodules CT 2/06; MRI 2/06, F/U W/ CONTRAST CT -  Mann to f.u?; nl alpha-fetoprotein 2/06 hx of hyperkalemia, further details not known. obesity-BMI 35.5 (4/06) renal lesion- right lower pole - 2/06 recurrent vaginal Candidiasis Sleep disturbances -  periodic limb movement- sleep study 5/05 -  Bruxism (grinding teeth)-sleep study 5/05 -  occasional sleep disorder breathing with RDI 2.2 skin tags external auditory canal,  S/P removal 4/06 hemorrhoids, small, internal and external. no polyps-Colonoscopy, Dr. Loreta Ave- 8/06;9/00 Otitis Media- bilateral 2/03 RUQ abd pain- neg HIDA 1/06 Osteoarthritis, bilateral knee (see PSH) - R knee arthroscopy - L knee moderative tricompartmental disease (4 view X-ray 9/07)   Past Surgical History: Last updated: April 17, 2007 Hysterectomy - TAH/BSO - 1999, reason unknown to me. no records. Knee arthroscopy, Right 2000 Tubal ligation, bilateral  Family History: Last updated: 04/17/07 Father died of stroke in his 11's Sister with MI at age 3 Family History of CAD Female 1st degree relative <60 Family History of Stroke M 1st degree relative  <50  Social History: Last updated: 04/17/2007 Never Smoked Alcohol use-no Drug use-no Married and lives with her husband. 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  Risk Factors: Alcohol Use: 0 (01/31/2010) Exercise: no (01/31/2010)  Risk Factors: Smoking Status: never (01/31/2010)  Review of Systems      See HPI CV:  Denies chest pain or discomfort and swelling of feet. Resp:  Denies cough. GI:  Denies nausea and vomiting. GU:  Denies dysuria and urinary frequency.  Physical Exam  General:  alert and well-developed.  VS reviewed and BP elevated secondary to being off meds Head:  normocephalic and atraumatic.   Eyes:  vision grossly intact, pupils equal, pupils round, and pupils reactive to light.   Mouth:  pharynx pink and moist.   Neck:  supple.   Lungs:  normal respiratory effort and normal breath sounds.   Heart:  normal rate and regular rhythm.   Abdomen:  soft and non-tender.   Msk:  normal ROM.   Extremities:  no LE edema noted Neurologic:  alert & oriented X3.   Skin:  small 1-2 cm moles present on neck, face   Impression & Recommendations:  Problem # 1:  HYPERTENSION (ICD-401.9) Assessment Deteriorated Reviewed patient's regimen. Patient states she was only taking Lisinopril and HCTZ. HCTZ was held at last visit due to soft blood pressure. BP is elevated today. Will restart her HCTZ back. Coreg was listed in EMR, however patient states she has not been on this for a while, as she feels like she does not need it. If her pressure stays elevated, I advised patient that we may restart Coreg. Pt is agreeable to this plan. Labs up to date.   The following medications were removed from the medication list:    Coreg 3.125 Mg Tabs (Carvedilol) .Marland Kitchen... Take 1 tablet by mouth two times a day Her updated medication list for this problem includes:    Hydrochlorothiazide 25 Mg Tabs  (Hydrochlorothiazide) .Marland Kitchen... Take 1 tablet by mouth once a day    Lisinopril 40 Mg Tabs (Lisinopril) .Marland Kitchen... Take 1 tablet by mouth once a day  Complete Medication List: 1)  Pravachol 40 Mg Tabs (Pravastatin sodium) .... Take 1 tablet by mouth once a day 2)  Zyrtec 10 Mg Tabs (Cetirizine hcl) .... Take 1 tablet by mouth once a day 3)  Glipizide 10 Mg Tabs (Glipizide) .... Take 2 tablets once daily 4)  Aspirin 81 Mg Tbec (Aspirin) .... Take 1 tablet by mouth once a day 5)  Metformin Hcl 500 Mg Tabs (Metformin hcl) .... Take 1 tablet by mouth two times a day 6)  Hydrochlorothiazide 25 Mg Tabs (Hydrochlorothiazide) .... Take 1 tablet by mouth once a day 7)  Lisinopril 40 Mg Tabs (Lisinopril) .... Take 1 tablet by mouth once a day  Other  Orders: Capillary Blood Glucose/CBG (16109)  Patient Instructions: 1)  Please schedule a follow-up appointment in 3 months. 2)  Please go to [facility] or any pharmacy and get blood pressure checked after 1 week, or come back to clinic and get blood pressure checked. 3)  Please restart Hydrochlorthiazide.   Prevention & Chronic Care Immunizations   Influenza vaccine: Fluvax Non-MCR  (01/23/2010)   Influenza vaccine deferral: Not indicated  (08/14/2009)    Tetanus booster: 12/29/2008: Td    Pneumococcal vaccine: Pneumovax  (01/23/2010)    H. zoster vaccine: Not documented   H. zoster vaccine deferral: Not indicated  (08/14/2009)  Colorectal Screening   Hemoccult: Not documented   Hemoccult action/deferral: Not indicated  (08/14/2009)    Colonoscopy: Results: Polyp.  Awaiting result   (10/24/2009)   Colonoscopy action/deferral: Further recommendations pending biopsy results.    (10/24/2009)   Colonoscopy due: 10/2009  Other Screening   Pap smear: Not documented   Pap smear action/deferral: Not indicated S/P hysterectomy  (07/03/2009)    Mammogram: ASSESSMENT: Negative - BI-RADS 1^MS DIGITAL SCREENING  (08/30/2009)   Mammogram  action/deferral: Screening mammogram in 1 year.     (08/26/2007)   Mammogram due: 08/20/2009    DXA bone density scan: Not documented   Smoking status: never  (01/31/2010)  Diabetes Mellitus   HgbA1C: 8.1 in hospital  (01/08/2010)    Eye exam: No diabetic retinopathy.     (10/27/2008)   Eye exam due: 11/2009    Foot exam: yes  (01/23/2010)   Foot exam action/deferral: Do today   High risk foot: Not documented   Foot care education: Done  (08/14/2009)    Urine microalbumin/creatinine ratio: 11.0  (10/04/2008)    Diabetes flowsheet reviewed?: Yes   Progress toward A1C goal: Unchanged  Lipids   Total Cholesterol: 144  (08/14/2009)   Lipid panel action/deferral: Lipid Panel ordered   LDL: 83  (08/14/2009)   LDL Direct: Not documented   HDL: 36  (08/14/2009)   Triglycerides: 124  (08/14/2009)    SGOT (AST): 14  (01/23/2010)   BMP action: Ordered   SGPT (ALT): 21  (01/23/2010)   Alkaline phosphatase: 79  (01/23/2010)   Total bilirubin: 0.5  (01/23/2010)    Lipid flowsheet reviewed?: Yes   Progress toward LDL goal: At goal  Hypertension   Last Blood Pressure: 153 / 83  (01/31/2010)   Serum creatinine: 0.77  (01/23/2010)   BMP action: Ordered   Serum potassium 4.1  (01/23/2010)    Hypertension flowsheet reviewed?: Yes   Progress toward BP goal: Deteriorated  Self-Management Support :   Personal Goals (by the next clinic visit) :     Personal A1C goal: 7  (01/31/2010)     Personal blood pressure goal: 130/80  (01/31/2010)     Personal LDL goal: 70  (01/31/2010)    Patient will work on the following items until the next clinic visit to reach self-care goals:     Medications and monitoring: take my medicines every day, bring all of my medications to every visit, examine my feet every day  (01/31/2010)     Eating: drink diet soda or water instead of juice or soda, eat more vegetables, use fresh or frozen vegetables, eat foods that are low in salt, eat baked foods  instead of fried foods, eat fruit for snacks and desserts  (01/31/2010)     Activity: take a 30 minute walk every day  (01/31/2010)    Diabetes self-management support: Written self-care plan  (  01/31/2010)   Diabetes care plan printed   Last diabetes self-management training by diabetes educator: 07/13/2009    Hypertension self-management support: Written self-care plan  (01/31/2010)   Hypertension self-care plan printed.    Lipid self-management support: Written self-care plan  (01/31/2010)   Lipid self-care plan printed.

## 2010-06-04 NOTE — Discharge Summary (Signed)
Summary: Hospital Discharge Update    Hospital Discharge Update:  Date of Admission: 01/08/2010 Date of Discharge: 01/11/2010  Brief Summary:  Pt evaluated and treated for the following: -EColi Urosepsis - sensitive to Ciprofloxacin *for which she was on IV for 2days, then to complete course on outpt basis -Chills/Fevers - she was spiking fevers even while on abx, not presumed to be sepsis given her normal pressures. Etiology unclear, repeat U/A, CXR, blood cultures (as of 9/9) were negative. No obvious source of infection on exam. She was clinically stable without fevers/chills in over 24 hrs before discharge.  -Psych - she had a very flat affect for the most part of her hospitalization, with decreased appetite and sleep. She denied feeling depressed and later opened up about financial difficulties. She was not started on antidepressents. -Elevated transaminases -likely secondary to shock liver, trended down during admission  Lab or other results pending at discharge:  Repeat blood cultures. No growth to date  Other follow-up issues:  She likely needs yearly CMPs given her history of NASH. Pls evaluate ?depression further to determine if antidepressants are indicated at this time  Problem list changes:  Added new problem of Hospitalized for  UTI (ICD-599.0)  Medication list changes:  Removed medication of DIFLUCAN 150 MG TABS (FLUCONAZOLE) take 1 tablet today Added new medication of CIPROFLOXACIN HCL 500 MG TABS (CIPROFLOXACIN HCL) take one tablet by mouth every 12 hours - Signed Rx of CIPROFLOXACIN HCL 500 MG TABS (CIPROFLOXACIN HCL) take one tablet by mouth every 12 hours;  #14 x 0;  Signed;  Entered by: Jaci Lazier MD;  Authorized by: Jaci Lazier MD;  Method used: Print then Give to Patient  The medication, problem, and allergy lists have been updated.  Please see the dictated discharge summary for details.  Discharge medications:  PRAVACHOL 40 MG TABS (PRAVASTATIN SODIUM) Take  1 tablet by mouth once a day ZYRTEC 10 MG TABS (CETIRIZINE HCL) Take 1 tablet by mouth once a day GLIPIZIDE 10 MG  TABS (GLIPIZIDE) Take 2 tablets once daily ASPIRIN 81 MG TBEC (ASPIRIN) Take 1 tablet by mouth once a day METFORMIN HCL 850 MG TABS (METFORMIN HCL) Take 1 tablet by mouth two times a day HYDROCHLOROTHIAZIDE 25 MG TABS (HYDROCHLOROTHIAZIDE) Take 1 tablet by mouth once a day LISINOPRIL 40 MG TABS (LISINOPRIL) Take 1 tablet by mouth once a day COREG 3.125 MG TABS (CARVEDILOL) Take 1 tablet by mouth two times a day MICONAZOLE NITRATE 2 % AERP (MICONAZOLE NITRATE) apply 2 times per day CIPROFLOXACIN HCL 500 MG TABS (CIPROFLOXACIN HCL) take one tablet by mouth every 12 hours  Other patient instructions:  Pls report any chest pain, trouble breathing, fever to 101 or greater to your nearest ER. Call if you have any other problems.  Note: Hospital Discharge Medications & Other Instructions handout was printed, one copy for patient and a second copy to be placed in hospital chart.

## 2010-06-04 NOTE — Assessment & Plan Note (Signed)
Summary: FU/EST/VS   Vital Signs:  Patient Profile:   65 Years Old Female Height:     59 inches (149.86 cm) Weight:      182.5 pounds (82.95 kg) BMI:     36.99 Temp:     97.4 degrees F (36.33 degrees C) oral Pulse rate:   71 / minute BP sitting:   156 / 80  (right arm)  Pt. in pain?   no  Vitals Entered By: Krystal Eaton Duncan Dull) (August 31, 2007 8:54 AM)              Is Patient Diabetic? Yes  Nutritional Status BMI of > 30 = obese CBG Result 236  Does patient need assistance? Functional Status Self care Ambulation Normal     PCP:  Eliseo Gum MD  Chief Complaint:  one mth f/u abd pain-much better after taking the lactulose.  History of Present Illness: 65 yo woman with Allergic Rhinitis, Normo Anemia NOS, DM type II, HL, HTN, NAFLD with regenerating nodules (2/2 Obesity/DM seen by Dr. Lavonia Drafts), Obesity (BMI 35), Sleep disturbances (PLS, Bruxism, Breathing d/o- 5/05 sleep study), recurrent vaginal candidiasis, Right renal lesion (seen 2/06), h/o multiple skin tags, and Int/Ext Hemorrhoids (Colonoscopy, Dr. Loreta Ave- 8/06) here for routine f/u visit. 65 year old female with PMH significant for DM, regenaration liver nodule ( CT abdomen 12- 2008) , HTN, and Hyperlipidemia.  She was last seen by me 03/25/07. She was seen in our clinic 07/21/07 by Dr. Sunnie Nielsen for right sided (and left sided) abdominal pain. She has been taking tramadol and lactulose for her pain, and says that her pain is much better now on the lactulose only.      Prior Medication List:  ATENOLOL 50 MG TABS (ATENOLOL) Take 1 tablet by mouth once a day MAXZIDE-25 37.5-25 MG TABS (TRIAMTERENE-HCTZ) Take 1 tablet by mouth once a day LOVASTATIN 40 MG TABS (LOVASTATIN) Take 1 tablet by mouth once a day ZYRTEC 10 MG TABS (CETIRIZINE HCL) Take 1 tablet by mouth once a day GLIPIZIDE 10 MG  TABS (GLIPIZIDE) Take 2 tablets once daily ASPIRIN 81 MG TBEC (ASPIRIN) Take 1 tablet by mouth once a day METFORMIN  HCL 500 MG  TABS (METFORMIN HCL) Take 1 tablet by mouth two times a day CONSTULOSE 10 GM/15ML  SOLN (LACTULOSE) take 15 ml twice a day for constipation as needed. ULTRAM 50 MG  TABS (TRAMADOL HCL) take 1 tab every 6 hr for pain as needed REGLAN 5 MG  TABS (METOCLOPRAMIDE HCL) take 1 tab 30 mint before meals.   Updated Prior Medication List: ATENOLOL 50 MG TABS (ATENOLOL) Take 1 tablet by mouth once a day MAXZIDE-25 37.5-25 MG TABS (TRIAMTERENE-HCTZ) Take 1 tablet by mouth once a day LOVASTATIN 40 MG TABS (LOVASTATIN) Take 1 tablet by mouth once a day ZYRTEC 10 MG TABS (CETIRIZINE HCL) Take 1 tablet by mouth once a day GLIPIZIDE 10 MG  TABS (GLIPIZIDE) Take 2 tablets once daily ASPIRIN 81 MG TBEC (ASPIRIN) Take 1 tablet by mouth once a day METFORMIN HCL 500 MG  TABS (METFORMIN HCL) Take 1 tablet by mouth two times a day CONSTULOSE 10 GM/15ML  SOLN (LACTULOSE) take 15 ml twice a day for constipation as needed.  Current Allergies (reviewed today): ! GLUCOPHAGE  Past Medical History:    Reviewed history from 03/25/2007 and no changes required:       Chest Pain 12/07       - Cardiology inpatient referral, Dr. [facility]       -  Negative Tc-99 Myoview, EF 70% 04/14/06        - Believed to be 2/2 GI as relieved by PPI       GERD vs Gastritis (12/07)       Allergic rhinitis       Anemia-NOS-normocytic. no further w/u done.       Diabetes mellitus, type II       Hyperlipidemia       Hypertension - dx 1995       nonalcoholic fatty liver disease 2/2 wt and DM- Abd US1/06, MRI2/06       -  5 liver lesions from fatty infiltrate and likely regenerationg nodules CT 2/06; MRI 2/06, F/U W/ CONTRAST CT       -  Mann to f.u?; nl alpha-fetoprotein 2/06       hx of hyperkalemia, further details not known.       obesity-BMI 35.5 (4/06)       renal lesion- right lower pole - 2/06       recurrent vaginal Candidiasis       Sleep disturbances       -  periodic limb movement- sleep study  5/05       -  Bruxism (grinding teeth)-sleep study 5/05       -  occasional sleep disorder breathing with RDI 2.2       skin tags external auditory canal, S/P removal 4/06       hemorrhoids, small, internal and external. no polyps-Colonoscopy, Dr. Loreta Ave- 8/06;9/00       Otitis Media- bilateral 2/03       RUQ abd pain- neg HIDA 1/06       Osteoarthritis, bilateral knee (see PSH)       - R knee arthroscopy       - L knee moderative tricompartmental disease (4 view X-ray 9/07)   Past Surgical History:    Reviewed history from 03/25/2007 and no changes required:       Hysterectomy - TAH/BSO - 1999, reason unknown to me. no records.       Knee arthroscopy, Right 2000       Tubal ligation, bilateral   Family History:    Reviewed history from 03/25/2007 and no changes required:       Father died of stroke in his 109's       Sister with MI at age 36       Family History of CAD Female 1st degree relative <60       Family History of Stroke M 1st degree relative <50  Social History:    Reviewed history from 03/25/2007 and no changes required:       Never Smoked       Alcohol use-no       Drug use-no       Married and lives with her husband.       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   Risk Factors: Tobacco use:  never Drug use:  no Alcohol use:  no Exercise:  no Seatbelt use:  100 %  Mammogram History:    Date of Last Mammogram:  08/26/2007  Colonoscopy History:     Date of Last Colonoscopy:  09/23/2004    Results:  Results: Small internal Hemorrhoids.          Physical Exam  General:     alert, well-developed, well-nourished, well-hydrated, and overweight-appearing.  Lungs:     normal respiratory effort, no intercostal retractions, no accessory muscle use, and normal breath sounds.   Heart:     normal rate, regular rhythm, and no murmur.   Extremities:     1+ left pedal edema and  1+ right pedal edema.      Impression & Recommendations:  Problem # 1:  Preventive Health Care (ICD-V70.0) TC:142 (12/02/2006 8:56:00 PM) HDL:42 (12/02/2006 8:56:00 PM) LDL:79 (12/02/2006 8:56:00 PM) Trig:104 (12/02/2006 8:56:00 PM)  SGOT:20 (07/21/2007 6:02:00 PM) SPGT:26 (07/21/2007 6:02:00 PM)   Colonoscopy:Sep 23, 2004. Int Hemorrhoids. Mammogram:Assessment: BIRADS 1. Location: [facility].    (08/26/2007 11:10:04 AM) Pap: TAH Flu: Vax:03/25/2007  Problem # 2:  COLONIC POLYPS, HX OF (ICD-V12.72) Assessment: Comment Only Normal colonoscopy in 2006. Needs repeat colonoscopy in May 2011.  Problem # 3:  HYPERTENSION (ICD-401.9) Assessment: Deteriorated Poorly controlled today. She says that she has not been taking her medicines correctly, trying to make them last longer. I explained to her that this is not the best thing to to do. We will simplify her regimen, and make it as inexpensive as possible. Stop the Atenolol which is more expensive and change her Maxzide to Lisinopril-HCTZ combo.   The following medications were removed from the medication list:    Atenolol 50 Mg Tabs (Atenolol) .Marland Kitchen... Take 1 tablet by mouth once a day    Maxzide-25 37.5-25 Mg Tabs (Triamterene-hctz) .Marland Kitchen... Take 1 tablet by mouth once a day  Her updated medication list for this problem includes:    Lisinopril-hydrochlorothiazide 20-25 Mg Tabs (Lisinopril-hydrochlorothiazide) .Marland Kitchen... Take 1 tablet by mouth once a day  BP today: 156/80 Prior BP: 135/74 (07/21/2007)  Prior 10 Yr Risk Heart Disease: Not enough information (07/09/2006)  Labs Reviewed: Creat: 0.85 (07/21/2007) Chol: 142 (12/02/2006)   HDL: 42 (12/02/2006)   LDL: 79 (12/02/2006)   TG: 104 (12/02/2006)   Problem # 4:  HYPERLIPIDEMIA (ICD-272.4) Assessment: Improved Well controlled. No changes to medicines at this time. Our [facility] list does not have the 40 mg tablet of lovastatin on their list for $4. I told her to  check on the cost, and we can adjust to ensure that she can afford by using 2-20 mg tablets or changing to pravastatin 40 mg.  Her updated medication list for this problem includes:    Lovastatin 40 Mg Tabs (Lovastatin) .Marland Kitchen... Take 1 tablet by mouth once a day  Labs Reviewed: Chol: 142 (12/02/2006)   HDL: 42 (12/02/2006)   LDL: 79 (12/02/2006)   TG: 104 (12/02/2006) SGOT: 20 (07/21/2007)   SGPT: 26 (07/21/2007)  Prior 10 Yr Risk Heart Disease: Not enough information (07/09/2006)   Problem # 5:  DIABETES MELLITUS, TYPE II (ICD-250.00) Assessment: Deteriorated Aic is elevated. Patient has not been taking all of her medicines as prescribed secondary to cost. She understands the risk of uncontrolled DM. We will send her urine for a M/C ratio today as well. Continue medicines at the same dose.  Her updated medication list for this problem includes:    Glipizide 10 Mg Tabs (Glipizide) .Marland Kitchen... Take 2 tablets once daily    Aspirin 81 Mg Tbec (Aspirin) .Marland Kitchen... Take 1 tablet by mouth once a day    Metformin Hcl 500 Mg Tabs (Metformin hcl) .Marland Kitchen... Take 1 tablet by mouth two times a day    Lisinopril-hydrochlorothiazide 20-25 Mg Tabs (Lisinopril-hydrochlorothiazide) .Marland Kitchen... Take 1 tablet by mouth once a day  Orders: T-Hgb A1C (in-house) (41660YT) T- Capillary Blood Glucose (01601) T-Urine Microalbumin  w/creat. ratio [phone] / [id number])  Labs Reviewed: HgBA1c: 8.2 (08/31/2007)   Creat: 0.85 (07/21/2007)      Complete Medication List: 1)  Lovastatin 40 Mg Tabs (Lovastatin) .... Take 1 tablet by mouth once a day 2)  Zyrtec 10 Mg Tabs (Cetirizine hcl) .... Take 1 tablet by mouth once a day 3)  Glipizide 10 Mg Tabs (Glipizide) .... Take 2 tablets once daily 4)  Aspirin 81 Mg Tbec (Aspirin) .... Take 1 tablet by mouth once a day 5)  Metformin Hcl 500 Mg Tabs (Metformin hcl) .... Take 1 tablet by mouth two times a day 6)  Constulose 10 Gm/28ml Soln (Lactulose) .... Take 15 ml twice a day for constipation as  needed. 7)  Lisinopril-hydrochlorothiazide 20-25 Mg Tabs (Lisinopril-hydrochlorothiazide) .... Take 1 tablet by mouth once a day   Patient Instructions: 1)  Please schedule a follow-up appointment in 2 weeks. At that time we will need to get bloodwork to check your kidney function the new medicine.  2)  BMP: V58.69 3)  Finish your Maxzide and Atenolol until you run out. After you are out of the Maxzide, buy your new prescription for Lisinopril-HCTZ. Do not get refills on either the Maxzide OR Atenolol when you finish your pills.     Prescriptions: LISINOPRIL-HYDROCHLOROTHIAZIDE 20-25 MG  TABS (LISINOPRIL-HYDROCHLOROTHIAZIDE) Take 1 tablet by mouth once a day  #31 x 6   Entered and Authorized by:   Eliseo Gum MD   Signed by:   Eliseo Gum MD on 08/31/2007   Method used:   Print then Give to Patient   RxID:   [id number]  ] Laboratory Results   Blood Tests   Date/Time Recieved: August 31, 2007 9:42 AM  Date/Time Reported: ..................................................................Marland KitchenOren Beckmann  August 31, 2007 9:42 AM   HGBA1C: 8.2%   (Normal Range: Non-Diabetic - 3-6%   Control Diabetic - 6-8%) CBG Random: 236       Colonoscopy  Procedure date:  09/23/2004  Findings:      Results: Small internal Hemorrhoids.       Comments:      Repeat colonoscopy in 5 years.   Procedures Next Due Date:    Colonoscopy: 10/2009   Colonoscopy  Procedure date:  09/23/2004  Findings:      Results: Small internal Hemorrhoids.       Comments:      Repeat colonoscopy in 5 years.   Procedures Next Due Date:    Colonoscopy: 10/2009

## 2010-06-04 NOTE — Progress Notes (Signed)
Summary: refill/hla  Phone Note Refill Request Message from:  Fax from Pharmacy on April 02, 2009 3:39 PM  Refills Requested: Medication #1:  GLIPIZIDE 10 MG  TABS Take 2 tablets once daily   Last Refilled: 7/23  Medication #2:  HYDROCHLOROTHIAZIDE 25 MG TABS Take 1 tablet by mouth once a day Initial call taken by: Marin Roberts RN,  April 02, 2009 3:39 PM  Follow-up for Phone Call        done Follow-up by: [name],  April 04, 2009 6:49 AM    Prescriptions: HYDROCHLOROTHIAZIDE 25 MG TABS (HYDROCHLOROTHIAZIDE) Take 1 tablet by mouth once a day  #30 x prn   Entered and Authorized by:   [name]   Signed by:   [name] on 04/04/2009   Method used:   Electronically to        [facility] [phone]* (retail)       [address]       [place], [place]  [address]       Ph: [phone]       Fax: [phone]   RxID:   [id number] GLIPIZIDE 10 MG  TABS (GLIPIZIDE) Take 2 tablets once daily  #60 x 11   Entered and Authorized by:   [name]   Signed by:   [name] on 04/04/2009   Method used:   Electronically to        [facility] [phone]* (retail)       [address]       [place], [place]  [address]       Ph: [phone]       Fax: [phone]   RxID:   [id number]

## 2010-06-04 NOTE — Assessment & Plan Note (Signed)
Summary: pain/gg   Vital Signs:  Patient Profile:   65 Years Old Female Height:     59 inches (149.86 cm) Weight:      183.9 pounds (83.59 kg) BMI:     37.28 O2 Sat:      97 % Temp:     98.2 degrees F (36.78 degrees C) oral Pulse rate:   76 / minute BP sitting:   140 / 82  (right arm)  Pt. in pain?   yes    Location:   side    Intensity:   7    Type:       aching  Vitals Entered ByFilomena Jungling (July 19, 2007 9:51 AM)              Is Patient Diabetic? Yes  Nutritional Status BMI of 25 - 29 = overweight  Have you ever been in a relationship where you felt threatened, hurt or afraid?No   Does patient need assistance? Functional Status Self care Ambulation Normal     PCP:  Eliseo Gum MD  Chief Complaint:  SIDE PAIN X 4 DAYS.  History of Present Illness: 65 year old female with PMH significant for multiple liver nodule likely 2 to regeneration, HTN, DM type2who present complaining of left lower quadrant ache pain, that started 4 days prior to this visit. she relates that the pain radiate to her left groin, at the beginig it was 8/10  today is 5/10. She does relate a long history of constipation on and off. last bowel movement was yesterday, she didn't notice any improved of her pain with BM. Last colonocspy 2006 : polips removed, no diverticulo.    she denies trauma, heavy lifting, fever, dusuria, nausea, vomiting, diarrhea, vaginal discharge.    Current Allergies: ! GLUCOPHAGE    Risk Factors: Tobacco use:  never Drug use:  no Alcohol use:  no Exercise:  no Seatbelt use:  100 %    Physical Exam  General:     alert, well-developed, well-nourished, and well-hydrated.   Lungs:     normal respiratory effort, no intercostal retractions, no accessory muscle use, and normal breath sounds.   Heart:     normal rate, regular rhythm, and no murmur.   Abdomen:     soft, normal bowel sounds, no guarding, no rigidity, and no rebound tenderness. tenderness on  palpation on left lower quadrant.    Impression & Recommendations:  Problem # 1:  ABDOMINAL PAIN, LEFT LOWER QUADRANT (ICD-789.04) [name] is complaining of left lower quadrant abdominal pain, ache quality 5/10, radiating to the thigh for which we are considering kidney stone, will get UA and Abdominal X ray. others possibilities are constipation, colitis ( but no diarrhea, no fever), PID (no vaginal discarge). UA was negative for infection or stone. will give lactulose for constipation, and tramadol 50mg  by mouth q6hr for pain as needed. will revaluate this week, if abdominal pain persist will consider ct abdomen and pelvis exam for culture.  Orders: T-Urinalysis ([id number]) Diagnostic X-Ray/Fluoroscopy (Diagnostic X-Ray/Flu)   Problem # 2:  HYPERTENSION (ICD-401.9) Bp 140/80 slightly increase for her usual BP level. this can be secondary to pain. Cont current meds. Her updated medication list for this problem includes:    Atenolol 50 Mg Tabs (Atenolol) .Marland Kitchen... Take 1 tablet by mouth once a day    Maxzide-25 37.5-25 Mg Tabs (Triamterene-hctz) .Marland Kitchen... Take 1 tablet by mouth once a day   Problem # 3:  DIABETES MELLITUS, TYPE II (ICD-250.00)  will get HbA1c for next appoinment. cont with currents med. prior HbA1c 7.9. Cont.with current meds. Her updated medication list for this problem includes:    Glipizide 10 Mg Tabs (Glipizide) .Marland Kitchen... Take 2 tablets once daily    Aspirin 81 Mg Tbec (Aspirin) .Marland Kitchen... Take 1 tablet by mouth once a day    Metformin Hcl 500 Mg Tabs (Metformin hcl) .Marland Kitchen... Take 1 tablet by mouth two times a day   Complete Medication List: 1)  Atenolol 50 Mg Tabs (Atenolol) .... Take 1 tablet by mouth once a day 2)  Maxzide-25 37.5-25 Mg Tabs (Triamterene-hctz) .... Take 1 tablet by mouth once a day 3)  Lovastatin 40 Mg Tabs (Lovastatin) .... Take 1 tablet by mouth once a day 4)  Zyrtec 10 Mg Tabs (Cetirizine hcl) .... Take 1 tablet by mouth once a day 5)  Glipizide 10 Mg  Tabs (Glipizide) .... Take 2 tablets once daily 6)  Aspirin 81 Mg Tbec (Aspirin) .... Take 1 tablet by mouth once a day 7)  Meloxicam 15 Mg Tabs (Meloxicam) .... As directed as needed for arthritis pain. 8)  Metformin Hcl 500 Mg Tabs (Metformin hcl) .... Take 1 tablet by mouth two times a day 9)  Constulose 10 Gm/63ml Soln (Lactulose) .... Take 15 ml twice a day for constipation as needed. 10)  Ultram 50 Mg Tabs (Tramadol hcl) .... Take 1 tab every 6 hr for pain as needed  Other Orders: Gastroenterology Referral (GI)   Patient Instructions: 1)  Please schedule a follow-up appointment in 2 weeks with PCP, 2)  BMP prior to visit, ICD-9: 3)  HbgA1C prior to visit, ICD-9: 4)  drink plenty of water.    Prescriptions: ULTRAM 50 MG  TABS (TRAMADOL HCL) take 1 tab every 6 hr for pain as needed  #15 x 0   Entered and Authorized by:   Hartley Barefoot MD   Signed by:   Hartley Barefoot MD on 07/19/2007   Method used:   Electronically sent to ...       [address]       [address]       [place], [place]  [address]       Ph: [phone]       Fax: [phone]   RxID:   [id number] CONSTULOSE 10 GM/15ML  SOLN (LACTULOSE) take 15 ml twice a day for constipation as needed.  #2 x 1   Entered and Authorized by:   Hartley Barefoot MD   Signed by:   Hartley Barefoot MD on 07/19/2007   Method used:   Electronically sent to ...       [address]*       [address]       [place], [place]  [address]       Ph: [phone]       Fax: [phone]   RxID:   [id number]  ]

## 2010-06-04 NOTE — Assessment & Plan Note (Signed)
Summary: est-ck/fu/meds/cfb   Vital Signs:  Patient Profile:   65 Years Old Female Height:     59 inches (149.86 cm) Weight:      180.4 pounds (82 kg) BMI:     36.57 Temp:     98.7 degrees F (37.06 degrees C) oral Pulse rate:   81 / minute BP sitting:   119 / 73  (right arm)  Vitals Entered By: Krystal Eaton Duncan Dull) (January 21, 2007 10:19 AM)             Is Patient Diabetic? Yes  Nutritional Status BMI of > 30 = obese CBG Result 280  Does patient need assistance? Functional Status Self care Ambulation Normal     Chief Complaint:  routine check chronic problems.  History of Present Illness: 65 yo woman with AR, Anemia, DM, HL, HTN, NAFLD, Obesity, PLS, Hemorrhoids here for chronic conditions. She is asking about exercising. She is thinking about joining the [facility]. She is also looking for a three wheeler bike to help. She has not checked her sugars in about a week. The last time she checked it was in the 200's. If her sugar is elevated she can tell. She states that "something" occurs with her eyes, and sometimes she gets a headache.      Prior Medications :  ATENOLOL 50 MG TABS (ATENOLOL) Take 1 tablet by mouth once a day MAXZIDE-25 37.5-25 MG TABS (TRIAMTERENE-HCTZ) Take 1 tablet by mouth once a day LOVASTATIN 40 MG TABS (LOVASTATIN) Take 1 tablet by mouth once a day ZYRTEC 10 MG TABS (CETIRIZINE HCL) Take 1 tablet by mouth once a day GLIPIZIDE 10 MG TABS (GLIPIZIDE) Take 1 tablet by mouth once a day ASPIRIN 81 MG TBEC (ASPIRIN) Take 1 tablet by mouth once a day MELOXICAM 15 MG TABS (MELOXICAM) as directed as needed for arthritis pain. ZANTAC 150 MG TABS (RANITIDINE HCL) Take 2 tabletes as needed for reflux once daily FLEXERIL 5 MG TABS (CYCLOBENZAPRINE HCL) Take 1 tablet by mouth two times a day as needed for pain METFORMIN HCL 500 MG TABS (METFORMIN HCL) Take 1 tablet by mouth two times a day    Current Allergies (reviewed today): ! GLUCOPHAGE      Physical  Exam  General:     alert, well-developed, well-nourished, and well-hydrated.   Lungs:     normal respiratory effort and normal breath sounds.   Heart:     normal rate, regular rhythm, and no murmur.   Abdomen:     soft, non-tender, and normal bowel sounds.   Extremities:     No LE edema.    Impression & Recommendations:  Problem # 1:  OBESITY NOS (ICD-278.00) She plans on exercise program with daughter. WIll stop her sugar drinks. The goal is 1 lb per week. I will see her in 3 mos. The goal at that time is 10 lbs less.   Problem # 2:  DIABETES MELLITUS, TYPE II (ICD-250.00) Refills given. Will increase her Glipizide to 20 mg daily.  Her updated medication list for this problem includes:    Glipizide 10 Mg Tabs (Glipizide) .Marland Kitchen... Take 2 tablets once daily    Aspirin 81 Mg Tbec (Aspirin) .Marland Kitchen... Take 1 tablet by mouth once a day    Metformin Hcl 500 Mg Tabs (Metformin hcl) .Marland Kitchen... Take 1 tablet by mouth two times a day  Orders: Capillary Blood Glucose (84132) Fingerstick ([address]010)  Labs Reviewed: HgBA1c: 8.9 (12/02/2006)   Creat: 0.91 (12/02/2006)  Problem # 3:  HYPERTENSION (ICD-401.9) Well controlled. No changes to meds. Her updated medication list for this problem includes:    Atenolol 50 Mg Tabs (Atenolol) .Marland Kitchen... Take 1 tablet by mouth once a day    Maxzide-25 37.5-25 Mg Tabs (Triamterene-hctz) .Marland Kitchen... Take 1 tablet by mouth once a day  BP today: 119/73 Prior BP: 119/71 (07/09/2006)  Prior 10 Yr Risk Heart Disease: Not enough information (07/09/2006)  Labs Reviewed: Creat: 0.91 (12/02/2006) Chol: 142 (12/02/2006)   HDL: 42 (12/02/2006)   LDL: 79 (12/02/2006)   TG: 104 (12/02/2006)   Problem # 4:  HYPERLIPIDEMIA (ICD-272.4) LDL 79. No changes to meds. Her updated medication list for this problem includes:    Lovastatin 40 Mg Tabs (Lovastatin) .Marland Kitchen... Take 1 tablet by mouth once a day  Labs Reviewed: Chol: 142 (12/02/2006)   HDL: 42 (12/02/2006)   LDL: 79 (12/02/2006)    TG: 104 (12/02/2006) SGOT: 16 (12/02/2006)   SGPT: 19 (12/02/2006)  Prior 10 Yr Risk Heart Disease: Not enough information (07/09/2006)   Problem # 5:  Preventive Health Care (ICD-V70.0) States that she was told to have eye exams every 2 instead of every 1 years. Last mammogram was April 07. She is due for another.  Last colonoscopy in 2000 by Dr. Loreta Ave and was negative for polyps.    Complete Medication List: 1)  Atenolol 50 Mg Tabs (Atenolol) .... Take 1 tablet by mouth once a day 2)  Maxzide-25 37.5-25 Mg Tabs (Triamterene-hctz) .... Take 1 tablet by mouth once a day 3)  Lovastatin 40 Mg Tabs (Lovastatin) .... Take 1 tablet by mouth once a day 4)  Zyrtec 10 Mg Tabs (Cetirizine hcl) .... Take 1 tablet by mouth once a day 5)  Glipizide 10 Mg Tabs (Glipizide) .... Take 2 tablets once daily 6)  Aspirin 81 Mg Tbec (Aspirin) .... Take 1 tablet by mouth once a day 7)  Meloxicam 15 Mg Tabs (Meloxicam) .... As directed as needed for arthritis pain. 8)  Metformin Hcl 500 Mg Tabs (Metformin hcl) .... Take 1 tablet by mouth two times a day   Patient Instructions: 1)  Please schedule a follow-up appointment in 3 months. 2)  Schedule your mammogram. 3)  Congratulations on choosing an exercise program regularly at least 20 minutes 5 times a week. If you develop chest pain, have severe difficulty breathing, or feel very tired , stop exercising immediately and seek medical attention. 4)  See your eye doctor yearly to check for diabetic eye damage. 5)  Check your blood sugars regularly. If your readings are usually above 200 : or below 70 you should contact our office.    Prescriptions: GLIPIZIDE 10 MG  TABS (GLIPIZIDE) Take 2 tablets once daily  #60 x 11   Entered and Authorized by:   Eliseo Gum MD   Signed by:   Eliseo Gum MD on 01/21/2007   Method used:   Electronically sent to ...       [facility] [address]       [address]       [place], [place]  [address]       Ph:         Fax:    RxID:   [id number] METFORMIN HCL 500 MG TABS (METFORMIN HCL) Take 1 tablet by mouth two times a day  #30 x 11   Entered and Authorized by:   Eliseo Gum MD   Signed by:   Eliseo Gum MD on 01/21/2007  Method used:   Electronically sent to ...       [facility] [address]       [address]       [place], [place]  [address]       Ph:        Fax:    RxID:   [id number] GLIPIZIDE 10 MG TABS (GLIPIZIDE) Take 1 tablet by mouth once a day  #30 x 11   Entered and Authorized by:   Eliseo Gum MD   Signed by:   Eliseo Gum MD on 01/21/2007   Method used:   Electronically sent to ...       [facility] [address]       [address]       [place], [place]  [address]       Ph:        Fax:    RxID:   [id number] ZYRTEC 10 MG TABS (CETIRIZINE HCL) Take 1 tablet by mouth once a day  #30 x 5   Entered and Authorized by:   Eliseo Gum MD   Signed by:   Eliseo Gum MD on 01/21/2007   Method used:   Electronically sent to ...       [facility] [address]       [address]       [place], [place]  [address]       Ph:        Fax:    RxID:   [id number] LOVASTATIN 40 MG TABS (LOVASTATIN) Take 1 tablet by mouth once a day  #30 x 11   Entered and Authorized by:   Eliseo Gum MD   Signed by:   Eliseo Gum MD on 01/21/2007   Method used:   Electronically sent to ...       [facility] [address]       [address]       [place], [place]  [address]       Ph:        Fax:    RxID:   [id number] MAXZIDE-25 37.5-25 MG TABS (TRIAMTERENE-HCTZ) Take 1 tablet by mouth once a day  #31 x 11   Entered and Authorized by:   Eliseo Gum MD   Signed by:   Eliseo Gum MD on 01/21/2007   Method used:   Electronically sent to ...       [facility] [address]       [address]       [place], [place]  [address]       Ph:        Fax:    RxID:   [id number] ATENOLOL 50 MG TABS (ATENOLOL) Take 1 tablet by mouth once a day  #30 x 11    Entered and Authorized by:   Eliseo Gum MD   Signed by:   Eliseo Gum MD on 01/21/2007   Method used:   Electronically sent to ...       [facility] [address]       [address]       [place], [place]  [address]       Ph:        Fax:    RxID:   [id number]  ] Last LDL:  79 (12/02/2006 8:56:00 PM)

## 2010-06-04 NOTE — Assessment & Plan Note (Signed)
Summary: FU VISIT/APPT AT 11:45 AM/DS   Vital Signs:  Patient Profile:   65 Years Old Female Weight:      178.3 pounds Temp:     98.1 degrees F 0 Pulse rate:   77 / minute BP sitting:   119 / 71  (right arm) Cuff size:   regular  Pt. in pain?   no  Vitals Entered By: Theotis Barrio (July 09, 2006 11:55 AM)              Is Patient Diabetic? Yes  Nutritional Status Obese CBG Result 314  Does patient need assistance? Functional Status Self care Ambulation Normal   Chief Complaint:  medication refill / routine checkup.  History of Present Illness: Pt has no complaints today.   Diabetes Management History:      She states understanding of dietary principles but she is not following the appropriate diet.  No sensory loss is reported.  Self foot exams are being performed.  She is checking home blood sugars.  She says that she is not exercising regularly.        Reported hypoglycemic symptoms include weakness.  Frequency of hypoglycemic symptoms are reported to be occasionally.  No hyperglycemic symptoms are reported.  Other comments include: Range 104-293. Checks sugars at least one time daily. .    Hypertension History:      She denies headache, chest pain, palpitations, dyspnea with exertion, orthopnea, PND, peripheral edema, visual symptoms, syncope, and side effects from treatment.  She notes no problems with any antihypertensive medication side effects.        Positive major cardiovascular risk factors include female age 5 years old or older, diabetes, hyperlipidemia, and hypertension.  Negative major cardiovascular risk factors include non-tobacco-user status.      Prior Medications: ATENOLOL 50 MG TABS (ATENOLOL) Take 1 tablet by mouth once a day MAXZIDE-25 37.5-25 MG TABS (TRIAMTERENE-HCTZ) Take 1 tablet by mouth once a day LOVASTATIN 40 MG TABS (LOVASTATIN) Take 1 tablet by mouth once a day ZYRTEC 10 MG TABS (CETIRIZINE HCL) Take 1 tablet by mouth once a  day GLIPIZIDE 10 MG TABS (GLIPIZIDE) Take 1 tablet by mouth once a day ASPIRIN 81 MG TBEC (ASPIRIN) Take 1 tablet by mouth once a day MELOXICAM 15 MG TABS (MELOXICAM) as directed as needed for arthritis pain. ZANTAC 150 MG TABS (RANITIDINE HCL) Take 2 tabletes as needed for reflux once daily FLEXERIL 5 MG TABS (CYCLOBENZAPRINE HCL) Take 1 tablet by mouth two times a day as needed for pain METFORMIN HCL 500 MG TABS (METFORMIN HCL) Take 1 tablet by mouth two times a day Current Allergies: ! GLUCOPHAGE    Risk Factors:  Tobacco use:  never Alcohol use:  no Exercise:  no Seatbelt use:  100 %      Impression & Recommendations:  Problem # 1:  DIABETES MELLITUS, TYPE II (ICD-250.00) Go up on Glipizde. Call pt if Hba1c elevated. Pt checkssugars at home one daily.  Her updated medication list for this problem includes:    Glipizide 10 Mg Tabs (Glipizide) .Marland Kitchen... Take 1 tablet by mouth once a day    Aspirin 81 Mg Tbec (Aspirin) .Marland Kitchen... Take 1 tablet by mouth once a day    Metformin Hcl 500 Mg Tabs (Metformin hcl) .Marland Kitchen... Take 1 tablet by mouth two times a day  Orders: Capillary Blood Glucose (82948) Fingerstick (36416) T-Hgb A1C (in-house) (97026VZ)   Problem # 2:  OBESITY NOS (ICD-278.00) We talked about weight loss. She  has changed some of her diet already. She will continue to do so. Educated today. Also educated on prper exercise.   Problem # 3:  HYPERTENSION (ICD-401.9) NO changes to meds. Refills given. Her updated medication list for this problem includes:    Atenolol 50 Mg Tabs (Atenolol) .Marland Kitchen... Take 1 tablet by mouth once a day    Maxzide-25 37.5-25 Mg Tabs (Triamterene-hctz) .Marland Kitchen... Take 1 tablet by mouth once a day   Problem # 4:  HYPERLIPIDEMIA (ICD-272.4) No changes to meds. Refills given. Her updated medication list for this problem includes:    Lovastatin 40 Mg Tabs (Lovastatin) .Marland Kitchen... Take 1 tablet by mouth once a day   Medications Added to Medication List This  Visit: 1)  Zantac 150 Mg Tabs (Ranitidine hcl) .... Take 2 tabletes as needed for reflux once daily 2)  Metformin Hcl 500 Mg Tabs (Metformin hcl) .... Take 1 tablet by mouth two times a day  Diabetes Management Assessment/Plan:      The following lipid goals have been established for the patient: Total cholesterol goal of 200; LDL cholesterol goal of 100; HDL cholesterol goal of 40; Triglyceride goal of 200.  Her blood pressure goal is < 130/80.    Hypertension Assessment/Plan:      The patient's hypertensive risk group is category C: Target organ damage and/or diabetes.  Today's blood pressure is 119/71.  Her blood pressure goal is < 130/80.   Patient Instructions: 1)  Please schedule a follow-up appointment in 3 months. 2)  It is important that you exercise regularly at least 20 minutes 5 times a week. If you develop chest pain, have severe difficulty breathing, or feel very tired , stop exercising immediately and seek medical attention. 3)  You need to lose weight. Consider a lower calorie diet and regular exercise.  4)  Check your blood sugars regularly. If your readings are usually above : or below 70 you should contact our office. 5)  It is important that your Diabetic A1c level is checked every 3 months. Prescriptions: METFORMIN HCL 500 MG TABS (METFORMIN HCL) Take 1 tablet by mouth two times a day  #30 x 5   Entered and Authorized by:   Eliseo Gum MD   Signed by:   Eliseo Gum MD on 07/09/2006   Method used:   Print then Give to Patient   RxID:   [id number] GLIPIZIDE 10 MG TABS (GLIPIZIDE) Take 1 tablet by mouth once a day  #30 x 5   Entered and Authorized by:   Eliseo Gum MD   Signed by:   Eliseo Gum MD on 07/09/2006   Method used:   Print then Give to Patient   RxID:   [id number] ZYRTEC 10 MG TABS (CETIRIZINE HCL) Take 1 tablet by mouth once a day  #30 x 5   Entered and Authorized by:   Eliseo Gum MD   Signed by:   Eliseo Gum MD on  07/09/2006   Method used:   Print then Give to Patient   RxID:   [id number] LOVASTATIN 40 MG TABS (LOVASTATIN) Take 1 tablet by mouth once a day  #30 x 5   Entered and Authorized by:   Eliseo Gum MD   Signed by:   Eliseo Gum MD on 07/09/2006   Method used:   Print then Give to Patient   RxID:   [id number] ATENOLOL 50 MG TABS (ATENOLOL) Take 1 tablet by mouth once a day  #30 x 5   Entered  and Authorized by:   Eliseo Gum MD   Signed by:   Eliseo Gum MD on 07/09/2006   Method used:   Print then Give to Patient   RxID:   [id number]

## 2010-06-04 NOTE — Progress Notes (Signed)
Summary: Rx refill/dms  Phone Note Refill Request Message from:  Fax from Pharmacy on November 30, 2006 3:21 PM  Refills Requested: Medication #1:  MAXZIDE-25 37.5-25 MG TABS Take 1 tablet by mouth once a day   Last Refilled: 10/24/2006 last metabolic panel 05/15/05   Method Requested: electronic Initial call taken by: Henderson Cloud,  November 30, 2006 3:22 PM  Follow-up for Phone Call        Refill approved-nurse to complete. Refill with no add'l refills, but patient needs to come if for CMET/FLP as soon as possible.  Follow-up by: Eliseo Gum MD,  December 01, 2006 9:34 AM  Additional Follow-up for Phone Call Additional follow up Details #1::        Rx faxed to pharmacy. Flag sent to [facility] to schedule appt for lab.     Prescriptions: MAXZIDE-25 37.5-25 MG TABS (TRIAMTERENE-HCTZ) Take 1 tablet by mouth once a day  #31 x 0   Entered and Authorized by:   Eliseo Gum MD   Signed by:   Eliseo Gum MD on 12/01/2006   Method used:   Electronically sent to ...       [facility] [address]       [address]       [place], [place]  [address]       Ph:        Fax:    RxID:   [id number]

## 2010-06-06 NOTE — Assessment & Plan Note (Signed)
Summary: EST-3 MONTH F/U VISIT/CH   Vital Signs:  Patient profile:   65 year old female Height:      59 inches (149.86 cm) Weight:      172.01 pounds (78.19 kg) BMI:     34.87 Temp:     96.8 degrees F (36 degrees C) oral Pulse rate:   96 / minute BP sitting:   147 / 83  (right arm) Cuff size:   large  Vitals Entered By: Jillian Ok RN (April 17, 2010 8:52 AM) CC: abdominal pain Is Patient Diabetic? Yes Did you bring your meter with you today? No Pain Assessment Patient in pain? no      Nutritional Status BMI of > 30 = obese CBG Result 223  Have you ever been in a relationship where you felt threatened, hurt or afraid?No   Does patient need assistance? Functional Status Self care Ambulation Normal Comments Check up.  Needs refills.   Primary Care Domonick Sittner:  [name]  CC:  abdominal pain.  History of Present Illness: [name] is a 65 yo woman with PMH as outlined below.  She is here for routine follow up and does not have any complaints.  She denies any hypoglycemic events or symptoms of.    Depression History:      The patient denies a depressed mood most of the day and a diminished interest in her usual daily activities.         Preventive Screening-Counseling & Management  Alcohol-Tobacco     Alcohol drinks/day: 0     Smoking Status: never  Current Medications (verified): 1)  Pravachol 40 Mg Tabs (Pravastatin Sodium) .... Take 1 Tablet By Mouth Once A Day 2)  Zyrtec 10 Mg Tabs (Cetirizine Hcl) .... Take 1 Tablet By Mouth Once A Day 3)  Glipizide 10 Mg  Tabs (Glipizide) .... Take 2 Tablets Once Daily 4)  Aspirin 81 Mg Tbec (Aspirin) .... Take 1 Tablet By Mouth Once A Day 5)  Metformin Hcl 500 Mg Tabs (Metformin Hcl) .... Take 1 Tablet By Mouth Two Times A Day 6)  Hydrochlorothiazide 25 Mg Tabs (Hydrochlorothiazide) .... Take 1 Tablet By Mouth Once A Day 7)  Lisinopril 40 Mg Tabs (Lisinopril) .... Take 1 Tablet By Mouth Once A Day  Allergies  (verified): 1)  ! Glucophage  Past History:  Past Medical History: Last updated: 03/25/2007 Chest Pain 12/07 - Cardiology inpatient referral, Dr. Aubery [facility] - Negative Tc-99 Myoview, EF 70% 04/14/06  - Believed to be 2/2 GI as relieved by PPI GERD vs Gastritis (12/07) Allergic rhinitis Anemia-NOS-normocytic. no further w/u done. Diabetes mellitus, type II Hyperlipidemia Hypertension - dx 1995 nonalcoholic fatty liver disease 2/2 wt and DM- Abd US1/06, MRI2/06 -  5 liver lesions from fatty infiltrate and likely regenerationg nodules CT 2/06; MRI 2/06, F/U W/ CONTRAST CT -  Mann to f.u?; nl alpha-fetoprotein 2/06 hx of hyperkalemia, further details not known. obesity-BMI 35.5 (4/06) renal lesion- right lower pole - 2/06 recurrent vaginal Candidiasis Sleep disturbances -  periodic limb movement- sleep study 5/05 -  Bruxism (grinding teeth)-sleep study 5/05 -  occasional sleep disorder breathing with RDI 2.2 skin tags external auditory canal, S/P removal 4/06 hemorrhoids, small, internal and external. no polyps-Colonoscopy, Dr. Loreta Ave- 8/06;9/00 Otitis Media- bilateral 2/03 RUQ abd pain- neg HIDA 1/06 Osteoarthritis, bilateral knee (see PSH) - R knee arthroscopy - L knee moderative tricompartmental disease (4 view X-ray 9/07)   Past Surgical History: Last updated: 03/25/2007 Hysterectomy - TAH/BSO -  1999, reason unknown to me. no records. Knee arthroscopy, Right 2000 Tubal ligation, bilateral  Family History: Last updated: 04-06-07 Father died of stroke in his 10's Sister with MI at age 64 Family History of CAD Female 1st degree relative <60 Family History of Stroke M 1st degree relative <50  Social History: Last updated: 04-06-07 Never Smoked Alcohol use-no Drug use-no Married and lives with her husband. 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  Risk  Factors: Smoking Status: never (04/17/2010)  Review of Systems      See HPI  Physical Exam  General:  alert, obese, and well-developed.   Eyes:  vision grossly intact, pupils equal, and pupils round.   Lungs:  normal respiratory effort and no accessory muscle use.   Neurologic:  alert & oriented X3, cranial nerves II-XII intact, strength normal in all extremities, and gait normal.   Psych:  Oriented X3, memory intact for recent and remote, and normally interactive.     Impression & Recommendations:  Problem # 1:  DIABETES MELLITUS, TYPE II (ICD-250.00) A1c improved from prior [name] continue with current regimen and follow Pt has attempted higher dose of metformin and not tolerated 2/2 diarrhea  Her updated medication list for this problem includes:    Glipizide 10 Mg Tabs (Glipizide) .Marland Kitchen... Take 2 tablets once daily    Aspirin 81 Mg Tbec (Aspirin) .Marland Kitchen... Take 1 tablet by mouth once a day    Metformin Hcl 500 Mg Tabs (Metformin hcl) .Marland Kitchen... Take 1 tablet by mouth two times a day    Lisinopril 40 Mg Tabs (Lisinopril) .Marland Kitchen... Take 1 tablet by mouth once a day  Orders: Capillary Blood Glucose/CBG (81191)  Labs Reviewed: Creat: 0.77 (01/23/2010)     Last Eye Exam: No diabetic retinopathy.    (10/27/2008) Reviewed HgBA1c results: 7.5 (03/19/2010)  8.1 in hospital (01/08/2010)  Problem # 2:  HYPERTENSION (ICD-401.9) Virtually at goal (<140/80) Would require another medication and pt prefers to monitor and try low sodium Agree with above and will monitor, instructed on low sodium  Her updated medication list for this problem includes:    Hydrochlorothiazide 25 Mg Tabs (Hydrochlorothiazide) .Marland Kitchen... Take 1 tablet by mouth once a day    Lisinopril 40 Mg Tabs (Lisinopril) .Marland Kitchen... Take 1 tablet by mouth once a day  BP today: 147/83 Prior BP: 151/85 (03/19/2010)  Prior 10 Yr Risk Heart Disease: Not enough information (07/09/2006)  Labs Reviewed: K+: 4.1 (01/23/2010) Creat: : 0.77  (01/23/2010)   Chol: 144 (08/14/2009)   HDL: 36 (08/14/2009)   LDL: 83 (08/14/2009)   TG: 124 (08/14/2009)  Problem # 3:  HYPERLIPIDEMIA (ICD-272.4)  LDL at goal Will recheck on f/u visit LFTs on HFU wnl continue statin  Her updated medication list for this problem includes:    Pravachol 40 Mg Tabs (Pravastatin sodium) .Marland Kitchen... Take 1 tablet by mouth once a day  Labs Reviewed: SGOT: 14 (01/23/2010)   SGPT: 21 (01/23/2010)  Prior 10 Yr Risk Heart Disease: Not enough information (07/09/2006)   HDL:36 (08/14/2009), 42 (10/13/2008)  LDL:83 (08/14/2009), 107 (47/82/9562)  Chol:144 (08/14/2009), 169 (10/13/2008)  Trig:124 (08/14/2009), 99 (10/13/2008)  Problem # 4:  FATTY LIVER DISEASE (ICD-571.8) Pt had increased LFTs during hospitalizaiton in 01/2010 2/2 hypotension improved quickly and resolved upon HFU visit recheck LFTs regularly may need repeat imaging and/or f/u with Dr. Loreta Ave per PMH (please refer)  Problem # 5:  PREVENTIVE HEALTH CARE (ICD-V70.0)  immunizations and cancer screening up to date  discussed colon cancer screening study and pt is not interested  Complete Medication List: 1)  Pravachol 40 Mg Tabs (Pravastatin sodium) .... Take 1 tablet by mouth once a day 2)  Zyrtec 10 Mg Tabs (Cetirizine hcl) .... Take 1 tablet by mouth once a day 3)  Glipizide 10 Mg Tabs (Glipizide) .... Take 2 tablets once daily 4)  Aspirin 81 Mg Tbec (Aspirin) .... Take 1 tablet by mouth once a day 5)  Metformin Hcl 500 Mg Tabs (Metformin hcl) .... Take 1 tablet by mouth two times a day 6)  Hydrochlorothiazide 25 Mg Tabs (Hydrochlorothiazide) .... Take 1 tablet by mouth once a day 7)  Lisinopril 40 Mg Tabs (Lisinopril) .... Take 1 tablet by mouth once a day  Patient Instructions: 1)  Please schedule a follow-up appointment in 2-3 months. 2)  Continue with your medications below. 3)  If you have any problems before next appointment, call clinic. 4)  You are doing very well overall. 5)  Happy  Holidays. 6)  Limit your Sodium (Salt) to less than 2 grams a day(slightly less than 1/2 a teaspoon) to prevent fluid retention, swelling, or worsening of symptoms. Prescriptions: HYDROCHLOROTHIAZIDE 25 MG TABS (HYDROCHLOROTHIAZIDE) Take 1 tablet by mouth once a day  #30 x 6   Entered and Authorized by:   [name]   Signed by:   [name] on 04/17/2010   Method used:   Electronically to        [facility] [phone]* (retail)       [address]       [place], [place]  [address]       Ph: [phone]       Fax: [phone]   RxID:   [id number]    Orders Added: 1)  Capillary Blood Glucose/CBG [24401] 2)  Est. Patient Level III [02725]    Prevention & Chronic Care Immunizations   Influenza vaccine: Fluvax Non-MCR  (01/23/2010)   Influenza vaccine deferral: Deferred  (04/17/2010)    Tetanus booster: 12/29/2008: Td    Pneumococcal vaccine: Pneumovax  (01/23/2010)    H. zoster vaccine: Not documented   H. zoster vaccine deferral: Not indicated  (08/14/2009)  Colorectal Screening   Hemoccult: Not documented   Hemoccult action/deferral: Not indicated  (08/14/2009)    Colonoscopy: Results: Polyp.  Awaiting result   (10/24/2009)   Colonoscopy action/deferral: Further recommendations pending biopsy results.    (10/24/2009)   Colonoscopy due: 10/2009  Other Screening   Pap smear: Not documented   Pap smear action/deferral: Not indicated S/P hysterectomy  (07/03/2009)    Mammogram: ASSESSMENT: Negative - BI-RADS 1^MS DIGITAL SCREENING  (08/30/2009)   Mammogram action/deferral: Screening mammogram in 1 year.     (08/26/2007)   Mammogram due: 08/20/2009    DXA bone density scan: Not documented   Smoking status: never  (04/17/2010)  Diabetes Mellitus   HgbA1C: 7.5  (03/19/2010)    Eye exam: No diabetic retinopathy.     (10/27/2008)   Eye exam due: 11/2009    Foot exam: yes  (03/19/2010)   Foot exam action/deferral: Do today   High  risk foot: Not documented   Foot care education: Done  (08/14/2009)    Urine microalbumin/creatinine ratio: 8.6  (03/20/2010)    Diabetes flowsheet reviewed?: Yes   Progress toward A1C goal: Improved  Lipids   Total Cholesterol: 144  (08/14/2009)   Lipid panel action/deferral: Lipid Panel ordered  LDL: 83  (08/14/2009)   LDL Direct: Not documented   HDL: 36  (08/14/2009)   Triglycerides: 124  (08/14/2009)    SGOT (AST): 14  (01/23/2010)   BMP action: Ordered   SGPT (ALT): 21  (01/23/2010)   Alkaline phosphatase: 79  (01/23/2010)   Total bilirubin: 0.5  (01/23/2010)    Lipid flowsheet reviewed?: Yes   Progress toward LDL goal: At goal  Hypertension   Last Blood Pressure: 147 / 83  (04/17/2010)   Serum creatinine: 0.77  (01/23/2010)   BMP action: Ordered   Serum potassium 4.1  (01/23/2010)    Hypertension flowsheet reviewed?: Yes   Progress toward BP goal: Unchanged  Self-Management Support :   Personal Goals (by the next clinic visit) :     Personal A1C goal: 7  (01/31/2010)     Personal blood pressure goal: 130/80  (01/31/2010)     Personal LDL goal: 70  (01/31/2010)    Patient will work on the following items until the next clinic visit to reach self-care goals:     Medications and monitoring: take my medicines every day, check my blood sugar, bring all of my medications to every visit, examine my feet every day  (04/17/2010)     Eating: drink diet soda or water instead of juice or soda, eat more vegetables, use fresh or frozen vegetables, eat foods that are low in salt, eat baked foods instead of fried foods, eat fruit for snacks and desserts, limit or avoid alcohol  (04/17/2010)     Activity: take a 30 minute walk every day  (04/17/2010)    Diabetes self-management support: Written self-care plan, Education handout, Pre-printed educational material, Resources for patients handout  (04/17/2010)   Diabetes care plan printed   Diabetes education handout printed   Last  diabetes self-management training by diabetes educator: 07/13/2009    Hypertension self-management support: Written self-care plan, Education handout, Pre-printed educational material, Resources for patients handout  (04/17/2010)   Hypertension self-care plan printed.   Hypertension education handout printed    Lipid self-management support: Written self-care plan, Education handout, Pre-printed educational material, Resources for patients handout  (04/17/2010)   Lipid self-care plan printed.   Lipid education handout printed      Resource handout printed.

## 2010-06-27 ENCOUNTER — Ambulatory Visit (INDEPENDENT_AMBULATORY_CARE_PROVIDER_SITE_OTHER): Payer: Self-pay

## 2010-06-27 ENCOUNTER — Inpatient Hospital Stay (INDEPENDENT_AMBULATORY_CARE_PROVIDER_SITE_OTHER)
Admission: RE | Admit: 2010-06-27 | Discharge: 2010-06-27 | Disposition: A | Discharge status: Home / Self Care | Payer: Self-pay | Source: Ambulatory Visit | Attending: Family Medicine | Admitting: Family Medicine

## 2010-06-27 DIAGNOSIS — K5900 Constipation, unspecified: Secondary | ICD-10-CM

## 2010-06-27 LAB — POCT URINALYSIS DIPSTICK
Bilirubin Urine: NEGATIVE
Hgb urine dipstick: NEGATIVE
Ketones, ur: NEGATIVE mg/dL
Nitrite: NEGATIVE
Protein, ur: NEGATIVE mg/dL
Specific Gravity, Urine: 1.02 (ref 1.005–1.030)
Urine Glucose, Fasting: NEGATIVE mg/dL
Urobilinogen, UA: 0.2 mg/dL (ref 0.0–1.0)
pH: 7.5 (ref 5.0–8.0)

## 2010-06-27 LAB — OCCULT BLOOD, POC DEVICE: Fecal Occult Bld: NEGATIVE

## 2010-06-27 LAB — POCT I-STAT, CHEM 8
BUN: 11 mg/dL (ref 6–23)
Calcium, Ion: 1.18 mmol/L (ref 1.12–1.32)
Chloride: 101 mEq/L (ref 96–112)
Creatinine, Ser: 0.8 mg/dL (ref 0.4–1.2)
Glucose, Bld: 89 mg/dL (ref 70–99)
HCT: 39 % (ref 36.0–46.0)
Hemoglobin: 13.3 g/dL (ref 12.0–15.0)
Potassium: 3.5 mEq/L (ref 3.5–5.1)
Sodium: 141 mEq/L (ref 135–145)
TCO2: 28 mmol/L (ref 0–100)

## 2010-07-15 LAB — GLUCOSE, CAPILLARY: Glucose-Capillary: 223 mg/dL — ABNORMAL HIGH (ref 70–99)

## 2010-07-16 LAB — GLUCOSE, CAPILLARY: Glucose-Capillary: 182 mg/dL — ABNORMAL HIGH (ref 70–99)

## 2010-07-18 LAB — GLUCOSE, CAPILLARY
Glucose-Capillary: 157 mg/dL — ABNORMAL HIGH (ref 70–99)
Glucose-Capillary: 294 mg/dL — ABNORMAL HIGH (ref 70–99)
Glucose-Capillary: 132 mg/dL — ABNORMAL HIGH (ref 70–99)
Glucose-Capillary: 148 mg/dL — ABNORMAL HIGH (ref 70–99)
Glucose-Capillary: 158 mg/dL — ABNORMAL HIGH (ref 70–99)
Glucose-Capillary: 164 mg/dL — ABNORMAL HIGH (ref 70–99)
Glucose-Capillary: 169 mg/dL — ABNORMAL HIGH (ref 70–99)
Glucose-Capillary: 171 mg/dL — ABNORMAL HIGH (ref 70–99)
Glucose-Capillary: 172 mg/dL — ABNORMAL HIGH (ref 70–99)
Glucose-Capillary: 175 mg/dL — ABNORMAL HIGH (ref 70–99)
Glucose-Capillary: 179 mg/dL — ABNORMAL HIGH (ref 70–99)
Glucose-Capillary: 189 mg/dL — ABNORMAL HIGH (ref 70–99)
Glucose-Capillary: 195 mg/dL — ABNORMAL HIGH (ref 70–99)
Glucose-Capillary: 197 mg/dL — ABNORMAL HIGH (ref 70–99)
Glucose-Capillary: 205 mg/dL — ABNORMAL HIGH (ref 70–99)
Glucose-Capillary: 230 mg/dL — ABNORMAL HIGH (ref 70–99)
Glucose-Capillary: 257 mg/dL — ABNORMAL HIGH (ref 70–99)
Glucose-Capillary: 188 mg/dL — ABNORMAL HIGH (ref 70–99)

## 2010-07-18 LAB — URINALYSIS, ROUTINE W REFLEX MICROSCOPIC
Bilirubin Urine: NEGATIVE
Glucose, UA: NEGATIVE mg/dL
Glucose, UA: NEGATIVE mg/dL
Ketones, ur: 15 mg/dL — AB
Ketones, ur: 15 mg/dL — AB
Leukocytes, UA: NEGATIVE
Nitrite: NEGATIVE
Nitrite: NEGATIVE
Protein, ur: 30 mg/dL — AB
Protein, ur: 100 mg/dL — AB
Specific Gravity, Urine: 1.015 (ref 1.005–1.030)
Specific Gravity, Urine: 1.015 (ref 1.005–1.030)
Urobilinogen, UA: 1 mg/dL (ref 0.0–1.0)
Urobilinogen, UA: 2 mg/dL — ABNORMAL HIGH (ref 0.0–1.0)
pH: 6.5 (ref 5.0–8.0)
pH: 6 (ref 5.0–8.0)

## 2010-07-18 LAB — CBC
HCT: 27.6 % — ABNORMAL LOW (ref 36.0–46.0)
HCT: 30.8 % — ABNORMAL LOW (ref 36.0–46.0)
HCT: 31.9 % — ABNORMAL LOW (ref 36.0–46.0)
HCT: 34.3 % — ABNORMAL LOW (ref 36.0–46.0)
Hemoglobin: 10.5 g/dL — ABNORMAL LOW (ref 12.0–15.0)
Hemoglobin: 10.7 g/dL — ABNORMAL LOW (ref 12.0–15.0)
Hemoglobin: 9.8 g/dL — ABNORMAL LOW (ref 12.0–15.0)
Hemoglobin: 11.6 g/dL — ABNORMAL LOW (ref 12.0–15.0)
MCH: 29.2 pg (ref 26.0–34.0)
MCH: 29.6 pg (ref 26.0–34.0)
MCH: 29.8 pg (ref 26.0–34.0)
MCH: 30.7 pg (ref 26.0–34.0)
MCHC: 33.5 g/dL (ref 30.0–36.0)
MCHC: 34.1 g/dL (ref 30.0–36.0)
MCHC: 35.5 g/dL (ref 30.0–36.0)
MCHC: 33.9 g/dL (ref 30.0–36.0)
MCV: 83.4 fL (ref 78.0–100.0)
MCV: 86.9 fL (ref 78.0–100.0)
MCV: 87.5 fL (ref 78.0–100.0)
MCV: 90.8 fL (ref 78.0–100.0)
Platelets: 109 10*3/uL — ABNORMAL LOW (ref 150–400)
Platelets: 116 10*3/uL — ABNORMAL LOW (ref 150–400)
Platelets: 116 10*3/uL — ABNORMAL LOW (ref 150–400)
RBC: 3.31 MIL/uL — ABNORMAL LOW (ref 3.87–5.11)
RBC: 3.52 MIL/uL — ABNORMAL LOW (ref 3.87–5.11)
RBC: 3.67 MIL/uL — ABNORMAL LOW (ref 3.87–5.11)
RBC: 3.79 MIL/uL — ABNORMAL LOW (ref 3.87–5.11)
RDW: 15.9 % — ABNORMAL HIGH (ref 11.5–15.5)
RDW: 16.1 % — ABNORMAL HIGH (ref 11.5–15.5)
RDW: 16.5 % — ABNORMAL HIGH (ref 11.5–15.5)
RDW: 16.3 % — ABNORMAL HIGH (ref 11.5–15.5)
WBC: 10.8 10*3/uL — ABNORMAL HIGH (ref 4.0–10.5)
WBC: 8.6 10*3/uL (ref 4.0–10.5)
WBC: 9.9 10*3/uL (ref 4.0–10.5)
WBC: 2 10*3/uL — ABNORMAL LOW (ref 4.0–10.5)

## 2010-07-18 LAB — DIFFERENTIAL
Basophils Absolute: 0 10*3/uL (ref 0.0–0.1)
Basophils Absolute: 0.1 10*3/uL (ref 0.0–0.1)
Basophils Absolute: 0 10*3/uL (ref 0.0–0.1)
Basophils Relative: 0 % (ref 0–1)
Basophils Relative: 1 % (ref 0–1)
Basophils Relative: 0 % (ref 0–1)
Eosinophils Absolute: 0 10*3/uL (ref 0.0–0.7)
Eosinophils Absolute: 0 10*3/uL (ref 0.0–0.7)
Eosinophils Absolute: 0 10*3/uL (ref 0.0–0.7)
Eosinophils Relative: 0 % (ref 0–5)
Eosinophils Relative: 0 % (ref 0–5)
Eosinophils Relative: 0 % (ref 0–5)
Lymphocytes Relative: 21 % (ref 12–46)
Lymphocytes Relative: 8 % — ABNORMAL LOW (ref 12–46)
Lymphocytes Relative: 15 % (ref 12–46)
Lymphs Abs: 0.8 10*3/uL (ref 0.7–4.0)
Lymphs Abs: 1.8 10*3/uL (ref 0.7–4.0)
Lymphs Abs: 0.3 10*3/uL — ABNORMAL LOW (ref 0.7–4.0)
Monocytes Absolute: 0.5 10*3/uL (ref 0.1–1.0)
Monocytes Absolute: 1.1 10*3/uL — ABNORMAL HIGH (ref 0.1–1.0)
Monocytes Absolute: 0 10*3/uL — ABNORMAL LOW (ref 0.1–1.0)
Monocytes Relative: 13 % — ABNORMAL HIGH (ref 3–12)
Monocytes Relative: 5 % (ref 3–12)
Monocytes Relative: 0 % — ABNORMAL LOW (ref 3–12)
Neutro Abs: 5.6 10*3/uL (ref 1.7–7.7)
Neutro Abs: 8.5 10*3/uL — ABNORMAL HIGH (ref 1.7–7.7)
Neutro Abs: 1.7 10*3/uL (ref 1.7–7.7)
Neutrophils Relative %: 65 % (ref 43–77)
Neutrophils Relative %: 86 % — ABNORMAL HIGH (ref 43–77)
Neutrophils Relative %: 85 % — ABNORMAL HIGH (ref 43–77)

## 2010-07-18 LAB — CULTURE, BLOOD (ROUTINE X 2)
Culture: NO GROWTH
Culture: NO GROWTH
Culture: NO GROWTH
Culture: NO GROWTH

## 2010-07-18 LAB — COMPREHENSIVE METABOLIC PANEL
ALT: 117 U/L — ABNORMAL HIGH (ref 0–35)
ALT: 280 U/L — ABNORMAL HIGH (ref 0–35)
ALT: 527 U/L — ABNORMAL HIGH (ref 0–35)
AST: 149 U/L — ABNORMAL HIGH (ref 0–37)
AST: 34 U/L (ref 0–37)
AST: 866 U/L — ABNORMAL HIGH (ref 0–37)
Albumin: 2.8 g/dL — ABNORMAL LOW (ref 3.5–5.2)
Albumin: 2.9 g/dL — ABNORMAL LOW (ref 3.5–5.2)
Albumin: 3.2 g/dL — ABNORMAL LOW (ref 3.5–5.2)
Alkaline Phosphatase: 107 U/L (ref 39–117)
Alkaline Phosphatase: 119 U/L — ABNORMAL HIGH (ref 39–117)
Alkaline Phosphatase: 120 U/L — ABNORMAL HIGH (ref 39–117)
BUN: 14 mg/dL (ref 6–23)
BUN: 14 mg/dL (ref 6–23)
BUN: 8 mg/dL (ref 6–23)
CO2: 24 mEq/L (ref 19–32)
CO2: 25 mEq/L (ref 19–32)
CO2: 25 mEq/L (ref 19–32)
Calcium: 8 mg/dL — ABNORMAL LOW (ref 8.4–10.5)
Calcium: 8.2 mg/dL — ABNORMAL LOW (ref 8.4–10.5)
Calcium: 8.6 mg/dL (ref 8.4–10.5)
Chloride: 103 mEq/L (ref 96–112)
Chloride: 105 mEq/L (ref 96–112)
Chloride: 105 mEq/L (ref 96–112)
Creatinine, Ser: 0.92 mg/dL (ref 0.4–1.2)
Creatinine, Ser: 1.15 mg/dL (ref 0.4–1.2)
Creatinine, Ser: 1.38 mg/dL — ABNORMAL HIGH (ref 0.4–1.2)
GFR calc Af Amer: 47 mL/min — ABNORMAL LOW (ref >60)
GFR calc Af Amer: 58 mL/min — ABNORMAL LOW (ref >60)
GFR calc Af Amer: >60 mL/min (ref >60)
GFR calc non Af Amer: 39 mL/min — ABNORMAL LOW (ref >60)
GFR calc non Af Amer: 48 mL/min — ABNORMAL LOW (ref >60)
GFR calc non Af Amer: >60 mL/min (ref >60)
Glucose, Bld: 184 mg/dL — ABNORMAL HIGH (ref 70–99)
Glucose, Bld: 196 mg/dL — ABNORMAL HIGH (ref 70–99)
Glucose, Bld: 253 mg/dL — ABNORMAL HIGH (ref 70–99)
Potassium: 2.9 mEq/L — ABNORMAL LOW (ref 3.5–5.1)
Potassium: 3.2 mEq/L — ABNORMAL LOW (ref 3.5–5.1)
Potassium: 3.7 mEq/L (ref 3.5–5.1)
Sodium: 138 mEq/L (ref 135–145)
Sodium: 138 mEq/L (ref 135–145)
Sodium: 140 mEq/L (ref 135–145)
Total Bilirubin: 0.9 mg/dL (ref 0.3–1.2)
Total Bilirubin: 1.1 mg/dL (ref 0.3–1.2)
Total Bilirubin: 1.1 mg/dL (ref 0.3–1.2)
Total Protein: 5.9 g/dL — ABNORMAL LOW (ref 6.0–8.3)
Total Protein: 6.2 g/dL (ref 6.0–8.3)
Total Protein: 6.3 g/dL (ref 6.0–8.3)

## 2010-07-18 LAB — POCT I-STAT, CHEM 8
BUN: 14 mg/dL (ref 6–23)
Calcium, Ion: 1.07 mmol/L — ABNORMAL LOW (ref 1.12–1.32)
Chloride: 99 mEq/L (ref 96–112)
Creatinine, Ser: 1.3 mg/dL — ABNORMAL HIGH (ref 0.4–1.2)
Glucose, Bld: 189 mg/dL — ABNORMAL HIGH (ref 70–99)
HCT: 36 % (ref 36.0–46.0)
Hemoglobin: 12.2 g/dL (ref 12.0–15.0)
Potassium: 2.9 mEq/L — ABNORMAL LOW (ref 3.5–5.1)
Sodium: 138 mEq/L (ref 135–145)
TCO2: 26 mmol/L (ref 0–100)

## 2010-07-18 LAB — HEMOGLOBIN A1C
Hgb A1c MFr Bld: 8.1 % — ABNORMAL HIGH (ref <5.7)
Mean Plasma Glucose: 186 mg/dL — ABNORMAL HIGH (ref <117)

## 2010-07-18 LAB — PHOSPHORUS: Phosphorus: 3.2 mg/dL (ref 2.3–4.6)

## 2010-07-18 LAB — URINE MICROSCOPIC-ADD ON

## 2010-07-18 LAB — PROTIME-INR
INR: 1.43 (ref 0.00–1.49)
Prothrombin Time: 17.6 seconds — ABNORMAL HIGH (ref 11.6–15.2)

## 2010-07-18 LAB — POCT CARDIAC MARKERS
CKMB, poc: <1 ng/mL — ABNORMAL LOW (ref 1.0–8.0)
Myoglobin, poc: >500 ng/mL (ref 12–200)
Troponin i, poc: <0.05 ng/mL (ref 0.00–0.09)

## 2010-07-18 LAB — T4, FREE: Free T4: 0.97 ng/dL (ref 0.80–1.80)

## 2010-07-18 LAB — BRAIN NATRIURETIC PEPTIDE: Pro B Natriuretic peptide (BNP): 46 pg/mL (ref 0.0–100.0)

## 2010-07-18 LAB — URINE CULTURE
Colony Count: NO GROWTH
Colony Count: >=100000
Culture  Setup Time: 201109081521
Culture  Setup Time: 201109060537
Culture: NO GROWTH

## 2010-07-18 LAB — APTT: aPTT: 31 seconds (ref 24–37)

## 2010-07-18 LAB — PROCALCITONIN: Procalcitonin: 107.12 ng/mL

## 2010-07-18 LAB — TSH: TSH: 1.991 u[IU]/mL (ref 0.350–4.500)

## 2010-07-18 LAB — MAGNESIUM: Magnesium: 1.7 mg/dL (ref 1.5–2.5)

## 2010-07-23 ENCOUNTER — Other Ambulatory Visit: Payer: Self-pay | Admitting: Internal Medicine

## 2010-07-26 LAB — GLUCOSE, CAPILLARY: Glucose-Capillary: 206 mg/dL — ABNORMAL HIGH (ref 70–99)

## 2010-08-07 LAB — GLUCOSE, CAPILLARY: Glucose-Capillary: 210 mg/dL — ABNORMAL HIGH (ref 70–99)

## 2010-08-10 LAB — GLUCOSE, CAPILLARY: Glucose-Capillary: 78 mg/dL (ref 70–99)

## 2010-08-12 LAB — GLUCOSE, CAPILLARY: Glucose-Capillary: 168 mg/dL — ABNORMAL HIGH (ref 70–99)

## 2010-08-15 LAB — GLUCOSE, CAPILLARY: Glucose-Capillary: 192 mg/dL — ABNORMAL HIGH (ref 70–99)

## 2010-08-20 ENCOUNTER — Other Ambulatory Visit: Payer: Self-pay | Admitting: Internal Medicine

## 2010-09-05 ENCOUNTER — Other Ambulatory Visit: Payer: Self-pay | Admitting: Internal Medicine

## 2010-09-05 DIAGNOSIS — Z1231 Encounter for screening mammogram for malignant neoplasm of breast: Secondary | ICD-10-CM

## 2010-09-11 ENCOUNTER — Ambulatory Visit (HOSPITAL_COMMUNITY)
Admission: RE | Admit: 2010-09-11 | Discharge: 2010-09-11 | Disposition: A | Discharge status: Home / Self Care | Payer: Self-pay | Source: Ambulatory Visit | Attending: Internal Medicine | Admitting: Internal Medicine

## 2010-09-11 DIAGNOSIS — Z1231 Encounter for screening mammogram for malignant neoplasm of breast: Secondary | ICD-10-CM

## 2010-09-11 LAB — HM MAMMOGRAPHY

## 2010-09-13 ENCOUNTER — Emergency Department (HOSPITAL_COMMUNITY): Payer: Self-pay

## 2010-09-13 ENCOUNTER — Emergency Department (HOSPITAL_COMMUNITY)
Admission: EM | Admit: 2010-09-13 | Discharge: 2010-09-13 | Disposition: A | Discharge status: Home / Self Care | Payer: Self-pay | Attending: Emergency Medicine | Admitting: Emergency Medicine

## 2010-09-13 DIAGNOSIS — R079 Chest pain, unspecified: Secondary | ICD-10-CM | POA: Insufficient documentation

## 2010-09-13 DIAGNOSIS — E119 Type 2 diabetes mellitus without complications: Secondary | ICD-10-CM | POA: Insufficient documentation

## 2010-09-13 DIAGNOSIS — R51 Headache: Secondary | ICD-10-CM | POA: Insufficient documentation

## 2010-09-13 DIAGNOSIS — Z7982 Long term (current) use of aspirin: Secondary | ICD-10-CM | POA: Insufficient documentation

## 2010-09-13 DIAGNOSIS — Z79899 Other long term (current) drug therapy: Secondary | ICD-10-CM | POA: Insufficient documentation

## 2010-09-13 DIAGNOSIS — I10 Essential (primary) hypertension: Secondary | ICD-10-CM | POA: Insufficient documentation

## 2010-09-13 LAB — DIFFERENTIAL
Basophils Absolute: 0 10*3/uL (ref 0.0–0.1)
Basophils Relative: 0 % (ref 0–1)
Eosinophils Absolute: 0.1 10*3/uL (ref 0.0–0.7)
Eosinophils Relative: 2 % (ref 0–5)
Lymphocytes Relative: 47 % — ABNORMAL HIGH (ref 12–46)
Lymphs Abs: 3.3 10*3/uL (ref 0.7–4.0)
Monocytes Absolute: 0.5 10*3/uL (ref 0.1–1.0)
Monocytes Relative: 6 % (ref 3–12)
Neutro Abs: 3.1 10*3/uL (ref 1.7–7.7)
Neutrophils Relative %: 44 % (ref 43–77)

## 2010-09-13 LAB — BASIC METABOLIC PANEL
BUN: 13 mg/dL (ref 6–23)
CO2: 30 mEq/L (ref 19–32)
Calcium: 10.2 mg/dL (ref 8.4–10.5)
Chloride: 99 mEq/L (ref 96–112)
Creatinine, Ser: 0.59 mg/dL (ref 0.4–1.2)
GFR calc Af Amer: >60 mL/min (ref >60)
GFR calc non Af Amer: >60 mL/min (ref >60)
Glucose, Bld: 151 mg/dL — ABNORMAL HIGH (ref 70–99)
Potassium: 3.5 mEq/L (ref 3.5–5.1)
Sodium: 140 mEq/L (ref 135–145)

## 2010-09-13 LAB — URINALYSIS, ROUTINE W REFLEX MICROSCOPIC
Bilirubin Urine: NEGATIVE
Glucose, UA: NEGATIVE mg/dL
Hgb urine dipstick: NEGATIVE
Ketones, ur: NEGATIVE mg/dL
Nitrite: NEGATIVE
Protein, ur: NEGATIVE mg/dL
Specific Gravity, Urine: 1.01 (ref 1.005–1.030)
Urobilinogen, UA: 0.2 mg/dL (ref 0.0–1.0)
pH: 6 (ref 5.0–8.0)

## 2010-09-13 LAB — GLUCOSE, CAPILLARY: Glucose-Capillary: 163 mg/dL — ABNORMAL HIGH (ref 70–99)

## 2010-09-13 LAB — CBC
HCT: 35.7 % — ABNORMAL LOW (ref 36.0–46.0)
Hemoglobin: 12.1 g/dL (ref 12.0–15.0)
MCH: 30.3 pg (ref 26.0–34.0)
MCHC: 33.9 g/dL (ref 30.0–36.0)
MCV: 89.5 fL (ref 78.0–100.0)
Platelets: 194 10*3/uL (ref 150–400)
RBC: 3.99 MIL/uL (ref 3.87–5.11)
RDW: 15.6 % — ABNORMAL HIGH (ref 11.5–15.5)
WBC: 7 10*3/uL (ref 4.0–10.5)

## 2010-09-13 LAB — POCT CARDIAC MARKERS
CKMB, poc: 1.6 ng/mL (ref 1.0–8.0)
Myoglobin, poc: 87.5 ng/mL (ref 12–200)
Troponin i, poc: <0.05 ng/mL (ref 0.00–0.09)

## 2010-09-17 ENCOUNTER — Other Ambulatory Visit: Payer: Self-pay | Admitting: Internal Medicine

## 2010-09-17 DIAGNOSIS — R928 Other abnormal and inconclusive findings on diagnostic imaging of breast: Secondary | ICD-10-CM

## 2010-09-20 ENCOUNTER — Ambulatory Visit
Admission: RE | Admit: 2010-09-20 | Discharge: 2010-09-20 | Disposition: A | Discharge status: Home / Self Care | Payer: Self-pay | Source: Ambulatory Visit | Attending: Internal Medicine | Admitting: Internal Medicine

## 2010-09-20 ENCOUNTER — Other Ambulatory Visit: Payer: Self-pay | Admitting: Internal Medicine

## 2010-09-20 ENCOUNTER — Encounter: Payer: Self-pay | Admitting: Internal Medicine

## 2010-09-20 DIAGNOSIS — R928 Other abnormal and inconclusive findings on diagnostic imaging of breast: Secondary | ICD-10-CM

## 2010-09-20 DIAGNOSIS — R2230 Localized swelling, mass and lump, unspecified upper limb: Secondary | ICD-10-CM | POA: Insufficient documentation

## 2010-09-20 NOTE — Consult Note (Signed)
[name], [name]             ACCOUNT NO.:  [id number]   MEDICAL RECORD NO.:  [id number]          PATIENT TYPE:  INP   LOCATION:  4735                         FACILITY:  [facility]   PHYSICIAN:  Francisca December, M.D.  DATE OF BIRTH:  11/12/1945   DATE OF CONSULTATION:  04/14/2006  DATE OF DISCHARGE:                                 CONSULTATION   REASON FOR CONSULTATION:  Chest pain.   IMPRESSION:  1. Atypical angina - typical sounding angina but at rest, not      exertion.  2. Moderate risk for coronary artery disease.  3. Abnormal ECG (I have not seen).  4. Diabetes mellitus.  5. Hypertension.  6. Obesity.  7. Hyperlipidemia.  8. Osteoarthritis.  9. History of anemia.  10.History of hiatal hernia   RECOMMENDATIONS:  Awaiting ECG and initial cardiac enzymes to decide  about invasive versus noninvasive diagnostic strategy.  Otherwise agree  with your management thus far which includes telemetry monitoring,  aspirin, oxygen, morphine sulfate, p.r.n. nitroglycerin and metoprolol.   FINDINGS:  [name] is a pleasant 65 year old woman with multiple risk  factors for coronary heart disease as noted above.  She began having  discomfort in her chest on and off last week.  It became severe on  Friday with central substernal and pressure-like heaviness that did not  radiate.  It was associated with some dyspnea but no nausea, diaphoresis  or palpitation.  She became somewhat lightheaded.  The pain did not  radiate into the neck, back or arms.  She tried to self-medicate with a  beer which did not help.  The discomfort slowly eased over several hours  but has been persistent in her chest mildly since then.  It is slightly  worse with a deep breath.  It has not worsened with exertion.  She  presented to the internal medicine clinic today with these complaints  and was subsequently admitted.   An EKG in the clinic is described as left axis deviation and normal  intervals, flipped  T-waves in III, aVF, V3, V4 and flattened in lead II.  No comment about a prior tracing for comparison.   PAST MEDICAL HISTORY:  As noted above.   PAST SURGICAL HISTORY:  Total abdominal hysterectomy in 1999 and  bilateral tubal ligation.   CURRENT MEDICATIONS:  1. Atenolol 50 mg p.o. daily.  2. Metformin 500 mg p.o. b.i.d.  3. Zyrtec 10 mg p.o. daily.  4. Generic Maxzide 37.5/25 one p.o. daily.  5. Glipizide 10 mg p.o. daily.  6. Meloxicam 15 mg p.o. daily.   DRUG ALLERGIES:  NONE KNOWN.   FAMILY HISTORY:  Father died of a stroke in his 47s.  She has had one  sister with myocardial infarction at age 3.   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  She lives with her husband.  No tobacco use.  No ethanol.  No illicit  drugs.   REVIEW OF SYSTEMS:  She had some cough thought secondary to postnasal  drip and one episode of dysphasia over the weekend trying to swallow.  Otherwise  negative.   PHYSICAL EXAMINATION:  VITAL SIGNS:  Blood pressure 138/70, pulse 75 and  regular, respiratory rate 16 temperature 97.6.  GENERAL APPEARANCE:  This is a mildly obese middle-aged African American  woman in no distress.  Well-kept.  HEENT:  Unremarkable.  Head is atraumatic and normocephalic.  Pupils are  equal, round and react to light and accommodation.  Extraocular  movements are intact.  Sclerae are anicteric.  Oral mucosa is pink and  moist.  Tongue is not coated.  NECK:  Supple without thyromegaly or masses.  The carotid upstrokes are  normal.  No bruit.  No JVD.  CHEST:  Clear with adequate excursion bilaterally.  No wheezes, rales or  rhonchi.  HEART:  Regular rhythm, normal S1-S2 is heard.  No S3-S4, murmur, click  or rub noted.  ABDOMEN:  Mildly obese, soft, nontender.  No midline pulsatile mass.  Bowel sounds present in all quadrants.  No hepatomegaly.  EXTERNAL GENITALIA:  Without lesions.  RECTAL:  Not performed.  EXTREMITIES:  Full range of motion.   No edema.  Intact distal pulses.  NEUROLOGIC:  Cranial nerves II-XII intact.  Motor and sensory grossly  intact.  Gait not tested.  She has appropriate mood and affect.  SKIN:  Warm, dry and clear.   Hemoglobin A1c on admission is 8. CBG is 157.  Other laboratory pending.   COMMENTS:  The overall history sounds more likely esophageal  spasms/esophagitis, possibly related to chronic nonsteroidal anti-  inflammatory drug usage.  However, multiple risk factors certainly are  concerning for coronary disease and this needs to be ruled out  appropriately.   ADDENDUM:  I do have an EKG to review at this time.  It does show T-wave  inversion in V2 and V3, flattened in V4, inverted in lead III which is  normal sinus rhythm.      Francisca December, M.D.  Electronically Signed     JHE/MEDQ  D:  04/13/2006  T:  04/14/2006  Job:  [id number]

## 2010-09-20 NOTE — Op Note (Signed)
[place]  Patient:    TINAYA, CEBALLOS                    MRN: [id number] Proc. Date: 10/28/99 Adm. Date:  [id number] Attending:  Alinda Deem                           Operative Report  PREOPERATIVE DIAGNOSIS:  Right knee medial meniscal tear and chondral lesions.  POSTOPERATIVE DIAGNOSIS:  Right knee chondral loose body, 2 cm x 1 cm x 5 mm and chondromalacia of the medial femoral condyle, grade 4 that required drilling, lateral meniscal tear, degenerative, and chondromalacia of the lateral femoral condyle, grade 3-4.  PROCEDURE:  Right knee arthroscopic removal of large loose body, debridement of chondromalacia from the medial and lateral femoral condyle with drilling of the medial condyle.  Also, there is grade 3 chondromalacia of the lateral tibial condyle that was debrided, and partial lateral meniscectomy.  SURGEON:  Alinda Deem, M.D.  FIRST ASSISTANT:  Dorthula Matas, P.A.-C.  SECOND ASSISTANT:  Mamie Levers, P.A. student.  ANESTHESIA:  General LMA.  ESTIMATED BLOOD LOSS:  Minimal.  FLUID REPLACEMENT:  800 cc of crystalloid.  DRAINS PLACED:  None.  TOURNIQUET TIME:  None.  INDICATIONS FOR PROCEDURE:  A 65 year old woman with medial joint line pain of the right knee that has been increasing over time.  Radiographs showed some arthritic changes to the right knee and she is presumed to have a right knee medial meniscal tear and chondral lesion.  In any event, she has failed conservative treatment and now desires elective arthroscopic evaluation and treatment of the right knee.  DESCRIPTION OF PROCEDURE:  The patient was identified by arm band and taken to the operating room at [facility].  Appropriate anesthetic monitors were attached and general LMA anesthesia induced with the patient in the supine position.  A lateral post was applied to the table and the right lower extremity prepped and  draped in the usual sterile fashion from the ankle to mid thigh.  The skin along the inferomedial and inferolateral parapatellar regions were then infiltrated with 2-3 cc of 0.5% Marcaine and epinephrine solution, and standard inferomedial and inferolateral parapatellar portals were made with a #11 blade, allowing introduction of the arthroscope through the inferomedial portal and outflow through the inferolateral portal.  We then immediately encountered a 2 cm x 1 cm x 5 mm cartilaginous loose body to the medial aspect of the right knee and this required enlarging of the medial portal to about 5-6 mm in size, allowing removal of the loose body piecemeal with pituitary rongeurs and a 4.2 mm barracuda sucker shaver.  The patella and trochlea were in excellent condition.  The medial femoral condyle cartilage was delaminated over a 1 x 2 cm area, requiring debridement back to a stable margin and then drilling with a 62 K-wire to bring in fibrous tissue.  The medial meniscus was remarkably in good condition as were the ACL and PCL.  The lateral femoral condyle had grade 3 with some focal grade 4 chondromalacia requiring debridement, as did the lateral tibial condyle, and the lateral meniscus had extensive degenerative tearing requiring debridement back to a stable margin.  At this point, the knee was washed out with normal saline solution extensively to remove bits of articular cartilage.  Overall, the knee had moderate to severe chondromalacia and there is a significant  chance she will require total knee arthroplasty since her knee preoperatively did have a varus configuration as well.  She has a loose bicompartmental arthritis.  In any event, after washing the knee out with normal saline solution, the arthroscopic instruments were removed, and the medial portal was closed with a single 4-0 nylon suture. A dressing of Xeroform followed by a dressing, sponges, Webril, and Ace  wrap applied.  The patient was awakened and taken to the recovery room without difficulty. DD:  10/28/99 TD:  10/29/99 Job: [id number]

## 2010-09-20 NOTE — Discharge Summary (Signed)
NAMESUNJAI, [name]             ACCOUNT NO.:  [id number]   MEDICAL RECORD NO.:  [id number]          PATIENT TYPE:  INP   LOCATION:  4735                         FACILITY:  [facility]   PHYSICIAN:  Artist Beach, MD        DATE OF BIRTH:  [birth year]   DATE OF ADMISSION:  DATE OF DISCHARGE:                               DISCHARGE SUMMARY   DICTATED BY:  Emilie Rutter.   DISCHARGE DIAGNOSES:  1. Chest pain.  2. Type 2 diabetes, hemoglobin A1c today is 8.0.  3. Hypertension.  4. Nonalcoholic fatty liver disease.  5. Hyperlipidemia.  6. Allergic rhinitis.  7. History of hyperkalemia.  8. Obesity.  9. Osteoarthritis.  10.Hemorrhoids.  11.Normocytic anemia.  12.Total abdominal hysterectomy in 1999.  13.Bilateral tubal ligation.   DISCHARGE MEDICATIONS:  1. Aspirin 81 mg p.o. daily.  2. Atenolol 50 mg p.o. daily.  3. Zyrtec 10 mg p.o. daily.  4. Maxzide 37.5/25 mg p.o. daily.  5. Glipizide 10 mg p.o. daily.  6. Omeprazole 40 mg p.o. daily.  7. Meloxicam 15 mg p.o. daily, with attempt to avoid taking as much as      possible.  8. Tylenol 650 mg p.o. p.r.n. arthritis pain, up to t.i.d.   DISPOSITION:  The patient was admitted with chest pain, which had  relieved by the next morning.  The patient had normal cardiac studies.  The patient's pain seemed to be relieved by Protonix.  Will need to  follow up and make sure the patient has no more episodes of chest pain.  If chest pain persists, consider GI workup with EGD.  The patient's  hemoglobin A1c was high at admission, but her blood glucoses while in  the hospital were well-controlled, with the patient only taking her home  dose of glipizide.  Follow up to determine compliance.  The patient's  TSH was tested per a regular cardiac workup and was found to be high at  6.674.  Will need to retest TSH and free T4 at followup visit.   PROCEDURES PERFORMED:  1. Electrocardiogram on April 13, 2006 showed a T wave inversion in      V2  and V3, flattened in V4, inverted in lead III, normal sinus      rhythm.  2. Myocardial single-photon emission computed tomography imaging with      an ejection fraction and wall motion.  These showed no reversible      or irreversible defect to suggest ischemia or infarct.  There was      mild apical thinning.  Gated left ventricular wall motion study was      normal and left ventricular ejection fraction was 70% with end-      diastolic volume of 69 mL and end-systolic volume of 21 mL.      Impression was no evidence of ischemia or infarct.  3. Echo on April 14, 2006:  Results pending.   CONSULTATIONS:  Dr. Amil Amen at [facility].   ADMISSION HISTORY AND PHYSICAL:  This is a 65 year old woman with type 2  diabetes, hypertension, hyperlipidemia, and obesity, who presented with  approximately 3 days of pressure-like substernal pain.  It came on at  rest without radiation.  Given her multiple risk factors and rather  typical description of cardiac chest pain, she was admitted to the  hospital.  Her admission labs were CBC:  WBC 5, hemoglobin 10.9,  hematocrit 33.1, platelets 188.  PT of 13.3, INR 1, PTT 34.  B-MET:  Sodium 138, potassium 3.7, chloride 102, bicarb 26, glucose 110, BUN 9,  creatinine 0.8, calcium 9, total protein 6.1, albumin 3.8. AST 25, ALT  71, alkaline phosphatase 53, total bilirubin 0.6.  CK 207, CK-MB 2.4,  relative index 1.2.   HOSPITAL COURSE:  1. Chest pain:  The patient was felt to be a moderate cardiac risk,      given her multiple risk factors.  Cardiac consult was called, and      the patient was admitted to the hospital on telemetry, oxygen as      needed, morphine as needed, nitroglycerin as needed, and metoprolol      every 6 hours.  The cardiac enzymes were cycled, all of which were      negative.  EKG was concerning for but not diagnostic of coronary      artery disease.  A lipid panel was ordered, which is relatively      near goal.  The  patient had no further exacerbations of chest pain      during hospitalization, did not require oxygen, morphine, or      nitroglycerin during hospitalization, and her Cardiolite studies      were normal.  Therefore, the patient was discharged from the      hospital the next day.   It was felt that the chest pain likely represented gastroesophageal  reflux or peptic ulcer disease.  The description of the patient's pain  was consistent with GI pain, the patient had been taking meloxicam,  which is an NSAID, daily for pain.  The patient had epigastric pain  during the Cardiolite study with no change in cardiac or EKG, and the  patient improved while on Protonix overnight.  The patient was  discharged on omeprazole and was advised to take her meloxicam as  infrequently as possible.  The patient was told that aspirin should be  able to control most of her osteoarthritis pain and that that would be a  preferable drug, given her likely GI disease.  If patient's chest pain  continues, should consider an outpatient GI workup.  1. Type 2 diabetes:  The patient's diabetes is poorly-controlled, as      indicated by her hemoglobin A1c of 8.  This appears to be worst-      controlled than in the past.  The patient's blood sugars were under      good control while the patient was in the hospital, although the      patient was n.p.o. for much of her stay.  Should consider      compliance as a possible cause of her elevated hemoglobin A1c.  The      patient was sent home on her glipizide only.  Should restart her      metformin 500 mg b.i.d. if the patient's blood glucose control does      not improve.  2. Hypertension:  The patient was restarted on her home dose of beta      blocker and Maxzide.  It was reasonably well-controlled during      hospitalization.  3. Osteoarthritis:  As above, attempt to reduce the use of meloxicam     and consider Tylenol for pain when possible.  4. Nonalcoholic fatty  liver disease:  The patient's ALT is elevated.      Otherwise, the patient's liver disease appears to be under control.  5. Anemia:  The patient's hemoglobin is low, but stable, at 10.9.  The      patient's baseline hemoglobin is usually around 11 or 12.  Consider      outpatient workup of anemia.  TSH is elevated, which could indicate      hypothyroidism, but will need to check free T4.   DISCHARGE VITAL SIGNS:  Discharge vitals:  Temperature 98.1, pulse 61,  respirations 20, CBG 75-144, blood pressure 133/70, saturations 98% on  room air.   DISCHARGE LABORATORY VALUES:  CBC:  WBC 5.2, hemoglobin 11.3, hematocrit  33.9, platelets 192.  B-MET:  Sodium 143, potassium 3.6, chloride 106,  bicarb 29, glucose 98, BUN 10, creatinine 0.9, calcium 9.6.      Artist Beach, MD  Electronically Signed     SP/MEDQ  D:  04/15/2006  T:  04/16/2006  Job:  [id number]   cc:   Outpatient Clinic

## 2010-09-20 NOTE — Op Note (Signed)
NAMEJOLETTE, [name]             ACCOUNT NO.:  [id number]   MEDICAL RECORD NO.:  [id number]          PATIENT TYPE:  AMB   LOCATION:  ENDO                         FACILITY:  [facility]   PHYSICIAN:  Anselmo Rod, M.D.  DATE OF BIRTH:  06/24/1945   DATE OF PROCEDURE:  09/23/2004  DATE OF DISCHARGE:                                 OPERATIVE REPORT   PROCEDURE PERFORMED:  Screening colonoscopy.   ENDOSCOPIST:  Anselmo Rod, M.D.   INSTRUMENT USED:  Olympus video colonoscope.   INDICATION FOR PROCEDURE:  A 65 year old African-American female with a  history of abdominal pain undergoing screening colonoscopy to rule out  colonic polyps, masses, etc.   PREPROCEDURE PREPARATION:  Informed consent was procured from the patient.  The patient was fasted for 8 hours prior to the procedure and prepped with a  bottle of magnesium citrate and a gallon of Go-LYTELY the night prior to the  procedure.  The risks and benefits of the procedure including a 10% miss  rate of cancer and polyp was discussed with the patient, as well.   PREPROCEDURE PHYSICAL EXAMINATION:  The patient had stable vital signs.  Neck supple.  Chest clear to auscultation.  S1, S2 regular.  Abdomen soft,  with normal bowel sounds.   DESCRIPTION OF PROCEDURE:  The patient was placed in the left lateral  decubitus position, sedated with 50 mg of Demerol and 5 mg of Versed in slow  incremental doses.  Once the patient was adequately sedated, maintained on  low-flow oxygen and continuous cardiac monitoring, the Olympus video  colonoscope was advanced from the rectum to the cecum.  The appendiceal  orifice and ileocecal valve were clearly visualized and photographed.  No  masses, polyps, erosions, ulcerations, or diverticula were seen.  There was  a significant amount of residual stool in the right colon.  Multiple washes  were done.  Retroflexion of the rectum revealed small internal hemorrhoids.  The patient tolerated the  procedure well without complications.   IMPRESSION:  1. Unrevealing colonoscopy up to the cecum.  No masses, polyps, or      diverticula were seen.  2. Small internal hemorrhoids seen on      retroflexion.  3. Significant amount of residual stool in the colon.      Small lesions could be missed.     RECOMMENDATIONS:  A repeat colonoscopy has been recommended in the next five  years unless the patient develops any abnormal symptoms in the interim.  2.  Outpatient follow-up as the need arises in the future.  3. A high fiber diet  and good liberal fluid intake has been advised.      JNM/MEDQ  D:  09/23/2004  T:  09/23/2004  Job:  [id number]   cc:   Eliseo Gum, M.D.  [address]- Resident  [place], [place] [address]  Fax: [phone]

## 2010-09-20 NOTE — Op Note (Signed)
[name], [name]             ACCOUNT NO.:  [id number]   MEDICAL RECORD NO.:  [id number]          PATIENT TYPE:  AMB   LOCATION:  ENDO                         FACILITY:  [facility]   PHYSICIAN:  Anselmo Rod, M.D.  DATE OF BIRTH:  Nov 07, 1945   DATE OF PROCEDURE:  06/12/2004  DATE OF DISCHARGE:  06/12/2004                                 OPERATIVE REPORT   PROCEDURE PERFORMED:  Colonoscopy, changed to flexible sigmoidoscopy.   ENDOSCOPIST:  Anselmo Rod, M.D.   INSTRUMENT:  Olympus video colonoscope.   INDICATIONS FOR PROCEDURE:  This is a 65 year old African-American female  undergoing a screening colonoscopy to rule out colonic polyps, masses, etc.   PREPROCEDURE PREPARATION:  Informed consent was procured from the patient.  The patient was fasted for eight hours prior to the procedure and prepped  with a bottle of magnesium citrate and a gallon of GoLYTELY the night prior  to the procedure.  The risks and benefits of the procedure, including a 10%  miss rate of cancer and polyps, were discussed with the patient.   DESCRIPTION OF PROCEDURE:  The patient was placed in left lateral decubitus  position and sedated with Demerol and Versed.  Once the patient was  adequately sedated, the Olympus video colonoscope was advanced into the  rectum to 64 cm with difficulty due to solid stool in the left colon.  The  procedure was aborted at this point with plans to reprep the patient and  redo the procedure at a later date.   IMPRESSION:  Incomplete colonoscopy, changed to a flexible sigmoidoscopy  secondary to findings of solid stool in the left colon.   RECOMMENDATIONS:  Reprep and redo the procedure at a later date.      JNM/MEDQ  D:  06/14/2004  T:  06/16/2004  Job:  [id number]   cc:   Eliseo Gum, M.D.

## 2010-09-23 ENCOUNTER — Other Ambulatory Visit: Payer: Self-pay | Admitting: Internal Medicine

## 2010-09-25 ENCOUNTER — Ambulatory Visit
Admission: RE | Admit: 2010-09-25 | Discharge: 2010-09-25 | Disposition: A | Discharge status: Home / Self Care | Payer: Self-pay | Source: Ambulatory Visit | Attending: Internal Medicine | Admitting: Internal Medicine

## 2010-09-25 ENCOUNTER — Other Ambulatory Visit: Payer: Self-pay | Admitting: Diagnostic Radiology

## 2010-09-25 ENCOUNTER — Other Ambulatory Visit: Payer: Self-pay | Admitting: Internal Medicine

## 2010-09-25 DIAGNOSIS — R928 Other abnormal and inconclusive findings on diagnostic imaging of breast: Secondary | ICD-10-CM

## 2010-09-25 DIAGNOSIS — C50911 Malignant neoplasm of unspecified site of right female breast: Secondary | ICD-10-CM | POA: Insufficient documentation

## 2010-09-25 DIAGNOSIS — N63 Unspecified lump in unspecified breast: Secondary | ICD-10-CM

## 2010-09-26 ENCOUNTER — Other Ambulatory Visit: Payer: Self-pay | Admitting: Internal Medicine

## 2010-09-26 ENCOUNTER — Ambulatory Visit
Admission: RE | Admit: 2010-09-26 | Discharge: 2010-09-26 | Disposition: A | Discharge status: Home / Self Care | Payer: No Typology Code available for payment source | Source: Ambulatory Visit | Attending: Internal Medicine | Admitting: Internal Medicine

## 2010-09-26 DIAGNOSIS — N63 Unspecified lump in unspecified breast: Secondary | ICD-10-CM

## 2010-09-26 DIAGNOSIS — C50911 Malignant neoplasm of unspecified site of right female breast: Secondary | ICD-10-CM

## 2010-10-01 ENCOUNTER — Encounter: Payer: Self-pay | Admitting: Internal Medicine

## 2010-10-01 ENCOUNTER — Ambulatory Visit
Admission: RE | Admit: 2010-10-01 | Discharge: 2010-10-01 | Disposition: A | Discharge status: Home / Self Care | Payer: No Typology Code available for payment source | Source: Ambulatory Visit | Attending: Internal Medicine | Admitting: Internal Medicine

## 2010-10-01 ENCOUNTER — Other Ambulatory Visit: Payer: Self-pay | Admitting: Internal Medicine

## 2010-10-01 ENCOUNTER — Other Ambulatory Visit: Payer: No Typology Code available for payment source

## 2010-10-01 DIAGNOSIS — C50911 Malignant neoplasm of unspecified site of right female breast: Secondary | ICD-10-CM

## 2010-10-01 DIAGNOSIS — R928 Other abnormal and inconclusive findings on diagnostic imaging of breast: Secondary | ICD-10-CM

## 2010-10-01 MED ORDER — GADOBENATE DIMEGLUMINE 529 MG/ML IV SOLN
15.0000 mL | Freq: Once | INTRAVENOUS | Status: AC | PRN
  Administered 2010-10-01: 15 mL via INTRAVENOUS

## 2010-10-01 NOTE — Progress Notes (Signed)
  Subjective:    Patient ID: [name], female    DOB: Oct 07, 1945, 65 y.o.   MRN: [id number]  HPI See "breast cancer" on problem list for details.  Have been following work up initiated by mass on routine mammogram earlier this month.  Axillary lymph node biopsy noted to be metastatic adenocarcinoma of the breast, strongly ER positive and patchy PR positivity.  Will follow up further work up/oncology recommendations.   Review of Systems     Objective:   Physical Exam        Assessment & Plan:

## 2010-10-02 ENCOUNTER — Other Ambulatory Visit: Payer: Self-pay | Admitting: Oncology

## 2010-10-02 ENCOUNTER — Encounter (HOSPITAL_BASED_OUTPATIENT_CLINIC_OR_DEPARTMENT_OTHER): Payer: No Typology Code available for payment source | Admitting: Oncology

## 2010-10-02 DIAGNOSIS — C50319 Malignant neoplasm of lower-inner quadrant of unspecified female breast: Secondary | ICD-10-CM

## 2010-10-02 DIAGNOSIS — C773 Secondary and unspecified malignant neoplasm of axilla and upper limb lymph nodes: Secondary | ICD-10-CM

## 2010-10-02 DIAGNOSIS — C50919 Malignant neoplasm of unspecified site of unspecified female breast: Secondary | ICD-10-CM

## 2010-10-02 LAB — CBC WITH DIFFERENTIAL/PLATELET
BASO%: 0.5 % (ref 0.0–2.0)
Basophils Absolute: 0 10*3/uL (ref 0.0–0.1)
EOS%: 1.9 % (ref 0.0–7.0)
Eosinophils Absolute: 0.1 10*3/uL (ref 0.0–0.5)
HCT: 36.4 % (ref 34.8–46.6)
HGB: 12 g/dL (ref 11.6–15.9)
LYMPH%: 37.9 % (ref 14.0–49.7)
MCH: 30.7 pg (ref 25.1–34.0)
MCHC: 32.8 g/dL (ref 31.5–36.0)
MCV: 93.4 fL (ref 79.5–101.0)
MONO#: 0.3 10*3/uL (ref 0.1–0.9)
MONO%: 4.8 % (ref 0.0–14.0)
NEUT#: 3.6 10*3/uL (ref 1.5–6.5)
NEUT%: 54.9 % (ref 38.4–76.8)
Platelets: 186 10*3/uL (ref 145–400)
RBC: 3.9 10*6/uL (ref 3.70–5.45)
RDW: 16.7 % — ABNORMAL HIGH (ref 11.2–14.5)
WBC: 6.6 10*3/uL (ref 3.9–10.3)
lymph#: 2.5 10*3/uL (ref 0.9–3.3)

## 2010-10-02 LAB — COMPREHENSIVE METABOLIC PANEL
ALT: 22 U/L (ref 0–35)
AST: 17 U/L (ref 0–37)
Albumin: 4.5 g/dL (ref 3.5–5.2)
Alkaline Phosphatase: 73 U/L (ref 39–117)
BUN: 14 mg/dL (ref 6–23)
CO2: 29 mEq/L (ref 19–32)
Calcium: 9.8 mg/dL (ref 8.4–10.5)
Chloride: 98 mEq/L (ref 96–112)
Creatinine, Ser: 0.74 mg/dL (ref 0.40–1.20)
Glucose, Bld: 235 mg/dL — ABNORMAL HIGH (ref 70–99)
Potassium: 3.4 mEq/L — ABNORMAL LOW (ref 3.5–5.3)
Sodium: 139 mEq/L (ref 135–145)
Total Bilirubin: 0.3 mg/dL (ref 0.3–1.2)
Total Protein: 7.6 g/dL (ref 6.0–8.3)

## 2010-10-02 LAB — CANCER ANTIGEN 27.29: CA 27.29: 5 U/mL (ref 0–39)

## 2010-10-04 ENCOUNTER — Encounter: Payer: Self-pay | Admitting: Internal Medicine

## 2010-10-04 ENCOUNTER — Ambulatory Visit
Admission: RE | Admit: 2010-10-04 | Discharge: 2010-10-04 | Disposition: A | Discharge status: Home / Self Care | Payer: No Typology Code available for payment source | Source: Ambulatory Visit | Attending: Internal Medicine | Admitting: Internal Medicine

## 2010-10-04 ENCOUNTER — Other Ambulatory Visit: Payer: Self-pay | Admitting: Diagnostic Radiology

## 2010-10-04 ENCOUNTER — Other Ambulatory Visit: Payer: Self-pay | Admitting: Internal Medicine

## 2010-10-04 DIAGNOSIS — R928 Other abnormal and inconclusive findings on diagnostic imaging of breast: Secondary | ICD-10-CM

## 2010-10-07 ENCOUNTER — Other Ambulatory Visit: Payer: Self-pay | Admitting: Internal Medicine

## 2010-10-07 DIAGNOSIS — R928 Other abnormal and inconclusive findings on diagnostic imaging of breast: Secondary | ICD-10-CM

## 2010-10-09 ENCOUNTER — Encounter (HOSPITAL_COMMUNITY)
Admission: RE | Admit: 2010-10-09 | Discharge: 2010-10-09 | Disposition: A | Discharge status: Home / Self Care | Payer: Self-pay | Source: Ambulatory Visit | Attending: Oncology | Admitting: Oncology

## 2010-10-09 ENCOUNTER — Encounter (HOSPITAL_COMMUNITY): Payer: Self-pay

## 2010-10-09 ENCOUNTER — Ambulatory Visit (HOSPITAL_COMMUNITY)
Admission: RE | Admit: 2010-10-09 | Discharge: 2010-10-09 | Disposition: A | Discharge status: Home / Self Care | Payer: Self-pay | Source: Ambulatory Visit | Attending: Oncology | Admitting: Oncology

## 2010-10-09 DIAGNOSIS — R599 Enlarged lymph nodes, unspecified: Secondary | ICD-10-CM | POA: Insufficient documentation

## 2010-10-09 DIAGNOSIS — J984 Other disorders of lung: Secondary | ICD-10-CM | POA: Insufficient documentation

## 2010-10-09 DIAGNOSIS — C50919 Malignant neoplasm of unspecified site of unspecified female breast: Secondary | ICD-10-CM | POA: Insufficient documentation

## 2010-10-09 DIAGNOSIS — K7689 Other specified diseases of liver: Secondary | ICD-10-CM | POA: Insufficient documentation

## 2010-10-09 DIAGNOSIS — C773 Secondary and unspecified malignant neoplasm of axilla and upper limb lymph nodes: Secondary | ICD-10-CM | POA: Insufficient documentation

## 2010-10-09 HISTORY — DX: Malignant neoplasm of unspecified site of unspecified female breast: C50.919

## 2010-10-09 LAB — GLUCOSE, CAPILLARY: Glucose-Capillary: 151 mg/dL — ABNORMAL HIGH (ref 70–99)

## 2010-10-09 MED ORDER — FLUDEOXYGLUCOSE F - 18 (FDG) INJECTION
18.1000 | Freq: Once | INTRAVENOUS | Status: AC | PRN
  Administered 2010-10-09: 18.1 via INTRAVENOUS

## 2010-10-09 MED ORDER — IOHEXOL 300 MG/ML  SOLN
80.0000 mL | Freq: Once | INTRAMUSCULAR | Status: AC | PRN
  Administered 2010-10-09: 80 mL via INTRAVENOUS

## 2010-10-11 ENCOUNTER — Ambulatory Visit
Admission: RE | Admit: 2010-10-11 | Discharge: 2010-10-11 | Disposition: A | Discharge status: Home / Self Care | Payer: Self-pay | Source: Ambulatory Visit | Attending: Internal Medicine | Admitting: Internal Medicine

## 2010-10-11 ENCOUNTER — Other Ambulatory Visit: Payer: Self-pay | Admitting: Diagnostic Radiology

## 2010-10-11 DIAGNOSIS — R928 Other abnormal and inconclusive findings on diagnostic imaging of breast: Secondary | ICD-10-CM

## 2010-10-14 ENCOUNTER — Encounter: Payer: Self-pay | Admitting: Internal Medicine

## 2010-10-23 ENCOUNTER — Ambulatory Visit: Payer: Self-pay | Admitting: Internal Medicine

## 2010-10-23 ENCOUNTER — Encounter: Payer: Self-pay | Admitting: Genetic Counselor

## 2010-10-25 ENCOUNTER — Encounter: Payer: Self-pay | Admitting: Genetic Counselor

## 2010-10-29 ENCOUNTER — Ambulatory Visit (HOSPITAL_COMMUNITY)
Admission: RE | Admit: 2010-10-29 | Discharge: 2010-10-29 | Disposition: A | Discharge status: Home / Self Care | Payer: Self-pay | Source: Ambulatory Visit | Attending: Oncology | Admitting: Oncology

## 2010-10-29 DIAGNOSIS — E119 Type 2 diabetes mellitus without complications: Secondary | ICD-10-CM | POA: Insufficient documentation

## 2010-10-29 DIAGNOSIS — I080 Rheumatic disorders of both mitral and aortic valves: Secondary | ICD-10-CM | POA: Insufficient documentation

## 2010-10-29 DIAGNOSIS — I079 Rheumatic tricuspid valve disease, unspecified: Secondary | ICD-10-CM | POA: Insufficient documentation

## 2010-10-29 DIAGNOSIS — I10 Essential (primary) hypertension: Secondary | ICD-10-CM | POA: Insufficient documentation

## 2010-10-29 DIAGNOSIS — C50919 Malignant neoplasm of unspecified site of unspecified female breast: Secondary | ICD-10-CM | POA: Insufficient documentation

## 2010-10-29 DIAGNOSIS — E785 Hyperlipidemia, unspecified: Secondary | ICD-10-CM | POA: Insufficient documentation

## 2010-10-29 HISTORY — PX: TRANSTHORACIC ECHOCARDIOGRAM: SHX275

## 2010-11-01 ENCOUNTER — Encounter (INDEPENDENT_AMBULATORY_CARE_PROVIDER_SITE_OTHER): Payer: Self-pay | Admitting: Surgery

## 2010-11-01 ENCOUNTER — Ambulatory Visit (INDEPENDENT_AMBULATORY_CARE_PROVIDER_SITE_OTHER): Payer: Self-pay | Admitting: Surgery

## 2010-11-01 VITALS — Ht 59.0 in | Wt 172.8 lb

## 2010-11-01 DIAGNOSIS — C50919 Malignant neoplasm of unspecified site of unspecified female breast: Secondary | ICD-10-CM

## 2010-11-01 NOTE — Patient Instructions (Signed)
Call when your genetic testing is back.  We will plan surgery date after.  Call Tammi at the [facility] to see if appointment with Dr Tami Lin Needs to be rescheduled.  Our office number is [phone].

## 2010-11-01 NOTE — Progress Notes (Signed)
Subjective:     Patient ID: [name], female   DOB: 01/25/1946, 65 y.o.   MRN: [id number]    Ht 4\' 11"  (1.499 m)  Wt 172 lb 12.8 oz (78.382 kg)  BMI 34.90 kg/m2    HPI The patient returns.  The second area in her right breast was biopsied and was fibrocystic disease.  Her primary right breast mass is 1.8 cm and she has a positive axillary lymph node.  Genetic testing is pending.  She is anxious.  She has a sore breast.  No drainage.   Review of Systems  Constitutional: Positive for appetite change and fatigue.  HENT: Negative.   Eyes: Negative.   Respiratory: Negative.   Cardiovascular: Negative.   Gastrointestinal: Negative.        Objective:   Physical Exam  Constitutional: [name], place, and time. She appears well-developed and well-nourished.  HENT:  Head: Normocephalic and atraumatic.  Eyes: Conjunctivae and EOM are normal. Pupils are equal, round, and reactive to light.  Pulmonary/Chest: Effort normal and breath sounds normal.  Musculoskeletal: Normal range of motion.  Neurological: She is alert and oriented to person, place, and time.       Assessment:  Right breast Cancer  Diabetes Mellitus Obesity   Plan:   Await genetic testing.  If negative,proceed with Right breast lumpectomy ,  Axillary lymph node dissection  and port placement. Risks include vessel injury,  Arm swelling,  Nerve injury,  Pneumothorax,  Hemothorax,  Cardiac perforation,  The need for further surgery,  And possibly mastectomy.  She agrees to proceed.

## 2010-11-04 ENCOUNTER — Encounter (HOSPITAL_BASED_OUTPATIENT_CLINIC_OR_DEPARTMENT_OTHER): Payer: Self-pay | Admitting: Oncology

## 2010-11-04 DIAGNOSIS — C773 Secondary and unspecified malignant neoplasm of axilla and upper limb lymph nodes: Secondary | ICD-10-CM

## 2010-11-04 DIAGNOSIS — C50319 Malignant neoplasm of lower-inner quadrant of unspecified female breast: Secondary | ICD-10-CM

## 2010-11-08 ENCOUNTER — Encounter: Payer: No Typology Code available for payment source | Admitting: Genetic Counselor

## 2010-11-13 ENCOUNTER — Ambulatory Visit (INDEPENDENT_AMBULATORY_CARE_PROVIDER_SITE_OTHER): Payer: Self-pay | Admitting: Surgery

## 2010-11-13 ENCOUNTER — Encounter (INDEPENDENT_AMBULATORY_CARE_PROVIDER_SITE_OTHER): Payer: Self-pay | Admitting: Surgery

## 2010-11-13 DIAGNOSIS — Z9889 Other specified postprocedural states: Secondary | ICD-10-CM

## 2010-11-13 DIAGNOSIS — C50919 Malignant neoplasm of unspecified site of unspecified female breast: Secondary | ICD-10-CM

## 2010-11-13 DIAGNOSIS — N644 Mastodynia: Secondary | ICD-10-CM

## 2010-11-13 MED ORDER — GABAPENTIN 300 MG PO CAPS: 300.0000 mg | ORAL_CAPSULE | Freq: Three times a day (TID) | ORAL | Status: DC

## 2010-11-13 MED ORDER — OXYCODONE-ACETAMINOPHEN 10-325 MG PO TABS: 1.0000 | ORAL_TABLET | Freq: Four times a day (QID) | ORAL | Status: DC | PRN

## 2010-11-13 NOTE — Progress Notes (Signed)
Subjective:     Patient ID: [name], female   DOB: [birth year], 65 y.o.   MRN: [id number]    There were no vitals taken for this visit.    HPI The patient returns to clinic today. She is BRCA 2 positive. She has been diagnosed with right breast cancerl with positive right axillary lymph nodes. She is here today for a followup visit and to plan surgical treatment.  Review of Systems negative     Objective:   Physical Exam  Not repeated today     Assessment:     Right breast cancer   Plan:     We discussed options today. She is BRCA2 and therefore bilateral mastectomies are  one option offered to  her today. She  does not wish to proceed that currently. I explained the increased incidence of breast cancer in the other breast with this condition. The tumor is located directly under the nipple and therefore lumpectomy with very difficult without removing the nipple. She has opted for a right mastectomy. She will need a right modified radical mastectomy since she has lymph nodes that are involved. She will require Port-A-Cath placement as well for postoperative chemotherapy.The surgical and non surgical options have been discussed with the patient.  Risks of surgery include bleeding,  Infection,  Flap necrosis,  Tissue loss,  Chronic pain,  Numbness,  And the need for additional procedures.  Reconstruction options also have been discussed with the patient as well.  The patient agrees to proceed.Port placement risk include bleeding,  Infection,  Pneumothorax,  Hemothorax,  Migration of catheter and fracture of catheter and injury to heart. She agrees to proceed.

## 2010-11-13 NOTE — Patient Instructions (Addendum)
You will be scheduled for surgery.

## 2010-11-22 ENCOUNTER — Encounter (HOSPITAL_COMMUNITY)
Admission: RE | Admit: 2010-11-22 | Discharge: 2010-11-22 | Disposition: A | Discharge status: Home / Self Care | Payer: Self-pay | Source: Ambulatory Visit | Attending: Surgery | Admitting: Surgery

## 2010-11-22 DIAGNOSIS — Z01818 Encounter for other preprocedural examination: Secondary | ICD-10-CM | POA: Insufficient documentation

## 2010-11-22 DIAGNOSIS — Z01812 Encounter for preprocedural laboratory examination: Secondary | ICD-10-CM | POA: Insufficient documentation

## 2010-11-22 LAB — COMPREHENSIVE METABOLIC PANEL
ALT: 24 U/L (ref 0–35)
AST: 16 U/L (ref 0–37)
Albumin: 4.1 g/dL (ref 3.5–5.2)
Alkaline Phosphatase: 77 U/L (ref 39–117)
BUN: 15 mg/dL (ref 6–23)
CO2: 27 mEq/L (ref 19–32)
Calcium: 9.4 mg/dL (ref 8.4–10.5)
Chloride: 100 mEq/L (ref 96–112)
Creatinine, Ser: 0.73 mg/dL (ref 0.50–1.10)
GFR calc Af Amer: >60 mL/min (ref >60)
GFR calc non Af Amer: >60 mL/min (ref >60)
Glucose, Bld: 279 mg/dL — ABNORMAL HIGH (ref 70–99)
Potassium: 3.8 mEq/L (ref 3.5–5.1)
Sodium: 140 mEq/L (ref 135–145)
Total Bilirubin: 0.4 mg/dL (ref 0.3–1.2)
Total Protein: 6.8 g/dL (ref 6.0–8.3)

## 2010-11-22 LAB — CBC
HCT: 35.5 % — ABNORMAL LOW (ref 36.0–46.0)
Hemoglobin: 11.9 g/dL — ABNORMAL LOW (ref 12.0–15.0)
MCH: 30.1 pg (ref 26.0–34.0)
MCHC: 33.5 g/dL (ref 30.0–36.0)
MCV: 89.9 fL (ref 78.0–100.0)
Platelets: 184 10*3/uL (ref 150–400)
RBC: 3.95 MIL/uL (ref 3.87–5.11)
RDW: 16.4 % — ABNORMAL HIGH (ref 11.5–15.5)
WBC: 5.5 10*3/uL (ref 4.0–10.5)

## 2010-11-22 LAB — SURGICAL PCR SCREEN
MRSA, PCR: NEGATIVE
Staphylococcus aureus: NEGATIVE

## 2010-11-22 LAB — DIFFERENTIAL
Basophils Absolute: 0 10*3/uL (ref 0.0–0.1)
Basophils Relative: 1 % (ref 0–1)
Eosinophils Absolute: 0.1 10*3/uL (ref 0.0–0.7)
Eosinophils Relative: 2 % (ref 0–5)
Lymphocytes Relative: 40 % (ref 12–46)
Lymphs Abs: 2.2 10*3/uL (ref 0.7–4.0)
Monocytes Absolute: 0.3 10*3/uL (ref 0.1–1.0)
Monocytes Relative: 5 % (ref 3–12)
Neutro Abs: 2.9 10*3/uL (ref 1.7–7.7)
Neutrophils Relative %: 52 % (ref 43–77)

## 2010-11-27 ENCOUNTER — Encounter: Payer: Self-pay | Admitting: Internal Medicine

## 2010-11-27 ENCOUNTER — Ambulatory Visit (INDEPENDENT_AMBULATORY_CARE_PROVIDER_SITE_OTHER): Payer: Self-pay | Admitting: Internal Medicine

## 2010-11-27 VITALS — BP 150/83 | HR 93 | Temp 97.5°F | Ht 59.0 in | Wt 171.9 lb

## 2010-11-27 DIAGNOSIS — I10 Essential (primary) hypertension: Secondary | ICD-10-CM

## 2010-11-27 DIAGNOSIS — E785 Hyperlipidemia, unspecified: Secondary | ICD-10-CM

## 2010-11-27 DIAGNOSIS — E119 Type 2 diabetes mellitus without complications: Secondary | ICD-10-CM

## 2010-11-27 LAB — POCT GLYCOSYLATED HEMOGLOBIN (HGB A1C): Hemoglobin A1C: 7.7

## 2010-11-27 LAB — GLUCOSE, CAPILLARY: Glucose-Capillary: 279 mg/dL — ABNORMAL HIGH (ref 70–99)

## 2010-11-27 NOTE — Assessment & Plan Note (Signed)
Patients blood pressure at visit is elevated.  She reports that her BP tends to be elevated at clinic.  In addition, she is not 100% compliant with her medications given recent stressors.  I discussed with her the importance of medication compliance for long term health benefits and short term benefits of recovery after surgery.  No medication changes at this time.  She will follow up in 5-6 weeks for reassessment.

## 2010-11-27 NOTE — Progress Notes (Signed)
  Subjective:    Patient ID: [name], female    DOB: [birth year], 65 y.o.   MRN: [id number]  HPI This is a 65 yo F who presents to clinic today for diabetic follow up.  She appears down/depressed, and upon questioning, she reports she has recently been diagnosed with breast cancer and will undergo surgery next week.  She is doing ok with the diagnosis, but upset about hospital bills.  Regarding her DM, she was last seen in December.  Her past medical history is significant for DM, HLD, HTN, anemia and OA.  Today she has no new complaints.  She denies recent hypoglycemic events.  She also denies symptoms of hyperglycemia such as polyuria, polydipsia and polyphagia.  She denies any recent changes in vision.  She endorses occasional numbness/tingling in her b/l lower extremities.     Review of Systems Review of Systems - General ROS: negative for - chills, fever, weight gain or weight loss Respiratory ROS: no cough, shortness of breath, or wheezing Cardiovascular ROS: no chest pain or dyspnea on exertion Gastrointestinal ROS: no abdominal pain, change in bowel habits, or black or bloody stools Genito-Urinary ROS: no dysuria, trouble voiding, or hematuria Neurological ROS: positive for - numbness/tingling negative for - behavioral changes, dizziness, gait disturbance, headaches, impaired coordination/balance or memory loss    Objective:   Physical Exam  General: no acute distress, depressed affect HEENT: PERRL, EOMI, no scleral icterus, no conjunctival pallor Cardiac: RRR, no rubs, murmurs or gallops, normal S1, S2 Pulm: clear to auscultation bilaterally, moving normal volumes of air, no wheezing/rales/rhonchi Abd: soft, nontender, nondistended, BS present Ext: warm and well perfused, no pedal edema, no ulcers on feet Neuro: alert and oriented X3, cranial nerves II-XII grossly intact, strength and sensation to light touch equal in bilateral upper and lower extremities  Assessment &  Plan:

## 2010-11-27 NOTE — Assessment & Plan Note (Signed)
Patient's most recent lipid panal was over 1 year ago.  She is currently not fasting, so she will return before her follow up in 5-6 weeks to have blood drawn for a fasting lipid panal.  For now, we will continue her Pravastatin.

## 2010-11-27 NOTE — Assessment & Plan Note (Signed)
Patient's DM is not currently under optimal control given Hb A1c=7.7 and her CBG today = 297.  She doesn't have a log of her blood sugars, as she only periodically checks it.  She also reports that given the stressors around her recent diagnosis of breast cancer, she probably misses her medications for about half the days in a week.  Her last UA in May was negative for blood and protein.    Patient has never been a smoker, and she is currently taking ASA, which she will stop prior to surgery next week for her breast cancer.    Plan: Counseled patient and encouraged medication compliance and follow up in 5-6 weeks. Patient to schedule appointment with her optometrist (Dr. Mitzi Davenport) after breast cancer surgery.   Patient to follow up with Jamison Neighbor regarding her blood glucose control.

## 2010-11-27 NOTE — Patient Instructions (Addendum)
-  Please schedule an annual eye exam with your eye doctor, and have results sent to our clinic.  -Please schedule a return to the clinic to draw blood for a fasting lipid panal before your next visit.  -Please schedule a time to see Jamison Neighbor regarding your blood sugar control.  - On your next visit, please bring a log of your blood sugars.  -Please schedule a follow up visit with Dr. Milbert Coulter in 5-6 weeks.    Thank you!

## 2010-11-28 NOTE — Progress Notes (Signed)
I saw patient and discussed her care with resident Dr. Milbert Coulter.  I agree with the clinical findings and plans as outlined in her note.

## 2010-11-29 ENCOUNTER — Other Ambulatory Visit: Payer: Self-pay | Admitting: Internal Medicine

## 2010-12-04 ENCOUNTER — Ambulatory Visit (HOSPITAL_COMMUNITY): Payer: Self-pay

## 2010-12-04 ENCOUNTER — Other Ambulatory Visit (INDEPENDENT_AMBULATORY_CARE_PROVIDER_SITE_OTHER): Payer: Self-pay | Admitting: Surgery

## 2010-12-04 ENCOUNTER — Ambulatory Visit (HOSPITAL_COMMUNITY)
Admission: RE | Admit: 2010-12-04 | Discharge: 2010-12-05 | Disposition: A | Discharge status: Home / Self Care | Payer: Self-pay | Source: Ambulatory Visit | Attending: Surgery | Admitting: Surgery

## 2010-12-04 DIAGNOSIS — E669 Obesity, unspecified: Secondary | ICD-10-CM | POA: Insufficient documentation

## 2010-12-04 DIAGNOSIS — C50919 Malignant neoplasm of unspecified site of unspecified female breast: Secondary | ICD-10-CM

## 2010-12-04 DIAGNOSIS — Z01812 Encounter for preprocedural laboratory examination: Secondary | ICD-10-CM | POA: Insufficient documentation

## 2010-12-04 DIAGNOSIS — C773 Secondary and unspecified malignant neoplasm of axilla and upper limb lymph nodes: Secondary | ICD-10-CM | POA: Insufficient documentation

## 2010-12-04 DIAGNOSIS — I10 Essential (primary) hypertension: Secondary | ICD-10-CM | POA: Insufficient documentation

## 2010-12-04 DIAGNOSIS — E119 Type 2 diabetes mellitus without complications: Secondary | ICD-10-CM | POA: Insufficient documentation

## 2010-12-04 HISTORY — PX: MASTECTOMY MODIFIED RADICAL: SUR848

## 2010-12-04 LAB — GLUCOSE, CAPILLARY
Glucose-Capillary: 127 mg/dL — ABNORMAL HIGH (ref 70–99)
Glucose-Capillary: 156 mg/dL — ABNORMAL HIGH (ref 70–99)
Glucose-Capillary: 207 mg/dL — ABNORMAL HIGH (ref 70–99)
Glucose-Capillary: 211 mg/dL — ABNORMAL HIGH (ref 70–99)

## 2010-12-05 ENCOUNTER — Telehealth (INDEPENDENT_AMBULATORY_CARE_PROVIDER_SITE_OTHER): Payer: Self-pay | Admitting: General Surgery

## 2010-12-05 LAB — GLUCOSE, CAPILLARY
Glucose-Capillary: 221 mg/dL — ABNORMAL HIGH (ref 70–99)
Glucose-Capillary: 190 mg/dL — ABNORMAL HIGH (ref 70–99)

## 2010-12-09 ENCOUNTER — Other Ambulatory Visit: Payer: Self-pay | Admitting: Oncology

## 2010-12-09 ENCOUNTER — Encounter (HOSPITAL_BASED_OUTPATIENT_CLINIC_OR_DEPARTMENT_OTHER): Payer: Self-pay | Admitting: Oncology

## 2010-12-09 DIAGNOSIS — I10 Essential (primary) hypertension: Secondary | ICD-10-CM

## 2010-12-09 DIAGNOSIS — C50319 Malignant neoplasm of lower-inner quadrant of unspecified female breast: Secondary | ICD-10-CM

## 2010-12-09 DIAGNOSIS — I972 Postmastectomy lymphedema syndrome: Secondary | ICD-10-CM

## 2010-12-09 DIAGNOSIS — C773 Secondary and unspecified malignant neoplasm of axilla and upper limb lymph nodes: Secondary | ICD-10-CM

## 2010-12-09 LAB — COMPREHENSIVE METABOLIC PANEL
ALT: 16 U/L (ref 0–35)
AST: 12 U/L (ref 0–37)
Albumin: 4 g/dL (ref 3.5–5.2)
Alkaline Phosphatase: 41 U/L (ref 39–117)
BUN: 13 mg/dL (ref 6–23)
CO2: 28 mEq/L (ref 19–32)
Calcium: 9.1 mg/dL (ref 8.4–10.5)
Chloride: 99 mEq/L (ref 96–112)
Creatinine, Ser: 0.91 mg/dL (ref 0.50–1.10)
Glucose, Bld: 205 mg/dL — ABNORMAL HIGH (ref 70–99)
Potassium: 3.4 mEq/L — ABNORMAL LOW (ref 3.5–5.3)
Sodium: 140 mEq/L (ref 135–145)
Total Bilirubin: 0.6 mg/dL (ref 0.3–1.2)
Total Protein: 6 g/dL (ref 6.0–8.3)

## 2010-12-09 LAB — CBC WITH DIFFERENTIAL/PLATELET
BASO%: 0.5 % (ref 0.0–2.0)
Basophils Absolute: 0 10*3/uL (ref 0.0–0.1)
EOS%: 1.1 % (ref 0.0–7.0)
Eosinophils Absolute: 0.1 10*3/uL (ref 0.0–0.5)
HCT: 24.8 % — ABNORMAL LOW (ref 34.8–46.6)
HGB: 8.3 g/dL — ABNORMAL LOW (ref 11.6–15.9)
LYMPH%: 36.1 % (ref 14.0–49.7)
MCH: 31.6 pg (ref 25.1–34.0)
MCHC: 33.6 g/dL (ref 31.5–36.0)
MCV: 94.2 fL (ref 79.5–101.0)
MONO#: 0.6 10*3/uL (ref 0.1–0.9)
MONO%: 6.9 % (ref 0.0–14.0)
NEUT#: 4.8 10*3/uL (ref 1.5–6.5)
NEUT%: 55.4 % (ref 38.4–76.8)
Platelets: 230 10*3/uL (ref 145–400)
RBC: 2.64 10*6/uL — ABNORMAL LOW (ref 3.70–5.45)
RDW: 18 % — ABNORMAL HIGH (ref 11.2–14.5)
WBC: 8.6 10*3/uL (ref 3.9–10.3)
lymph#: 3.1 10*3/uL (ref 0.9–3.3)

## 2010-12-10 NOTE — Discharge Summary (Signed)
  NAMEJENESYS, [name]             ACCOUNT NO.:  [id number]  MEDICAL RECORD NO.:  [id number]  LOCATION:  5128                         FACILITY:  [facility]  PHYSICIAN:  Maisie Fus A. Saanya Zieske, M.D.DATE OF BIRTH:  Sep 03, 1945  DATE OF ADMISSION:  12/04/2010 DATE OF DISCHARGE:  12/05/2010                              DISCHARGE SUMMARY   ADMITTING DIAGNOSIS:  Right breast cancer.  DISCHARGE DIAGNOSIS:  Right breast cancer.  BRIEF HISTORY:  The patient is a 65 year old female who has right breast cancer and she was admitted for right breast mastectomy and Port-A-Cath placement.  Please see the history physical for details.  Hospital course unremarkable.  She was discharged home on postop day #1. She had some significant serosanguineous drainage that was tailed off after about 12 hours.  Her wound showed some mild ecchymosis at discharge, but no large hematoma.  No signs of infection.  Left-sided Port-A-Cath site was clean, dry, and intact.  Her vital signs were stable.  She had no fever.  No evidence of tachycardia.  DISCHARGE INSTRUCTIONS:  She will follow up next week to have her drains removed.  She will refrain from driving and lifting until I see her back in the office.  She may shower tomorrow and move her dressings tomorrow. She will empty her drains daily and record the output.  She will be given a prescription for Percocet 1-2 tablets q.4 p.r.n. pain and resume her home medications as outlined in her medical reconciliation list which I have reviewed.  CONDITION ON DISCHARGE:  Improved.     [name] A. Heavan Francom, M.D.     TAC/MEDQ  D:  12/05/2010  T:  12/05/2010  Job:  [id number]  Electronically Signed by [name] M.D. on 12/10/2010 82:95:62 AM

## 2010-12-10 NOTE — Op Note (Signed)
NAMECIANNI, Jillian             ACCOUNT NO.:  [id number]  MEDICAL RECORD NO.:  [id number]  LOCATION:  [facility]SC                         FACILITY:  [facility]  PHYSICIAN:  Arbutus Nelligan A. Jashon Ishida, M.D.DATE OF BIRTH:  1946-01-29  DATE OF PROCEDURE:  12/04/2010 DATE OF DISCHARGE:                              OPERATIVE REPORT   PREOPERATIVE DIAGNOSIS:  Right breast cancer.  POSTOPERATIVE DIAGNOSIS:  Right breast cancer.  PROCEDURE: 1. Right modified radical mastectomy. 2. Placement of left subclavian 8-French Port-A-Cath with fluoroscopy.  SURGEON:  Maisie Fus A. Amandajo Gonder, MD  ASSISTANT:  Wilmon Arms. Tsuei, MD  ANESTHESIA:  LMA general endotracheal anesthesia.  ESTIMATED BLOOD LOSS:  50 mL.  SPECIMEN:  Right breast axillary contents to pathology.  DRAINS:  Two 19 round Blake drains to the right mastectomy wound.  INDICATIONS FOR PROCEDURE:  Patient is a 65 year old female with multifocal right breast cancer and metastasis to her right axillary lymph nodes.  Options of surgery were discussed.  She wished to proceed with right modified radical mastectomy and placement of a Port-A-Cath for postoperative chemotherapy which she will need.  We discussed this options of breast conservation, we felt she was not of good breast conservation candidate.  Risk of surgery and the Port-A-Cath risks were discussed with the patient.  Risk of mastectomy including bleeding, infection, flap necrosis, wound infections, wound breakdown, injury to the major blood vessels, nerves and veins involving the musculature of the right shoulder region and right subclavian vein region.  Also, discussed the need for further surgery.  Risks of Port-A-Cath include bleeding, infection, death, pneumothorax, pericardial tamponade, perforation of the vena cava, catheter migration, ventricular perforation, ventricular arrhythmia, hemothorax, mediastinal injury, the need to replace the catheter and catheter embolization and  catheter fragmentation, requiring removal and catheter malfunction with infection.  After the above discussion of the procedure, potential risks and alternative therapies were discussed as well with the patient she wished to proceed.  DESCRIPTION OF PROCEDURE:  The patient was seen in the holding area, the right breast was marked for mastectomy and left subclavian vein was initially identified for Port-A-Cath.  She was taken back to the operating room.  After induction of general anesthesia, she was placed in supine.  Upper chest, neck regions were prepped and draped in sterile fashion..  Time-out was then done.  She received 2 g of Ancef.  Port-A- Cath was done first.  She was placed in Trendelenburg.  Left subclavian vein was cannulated easily and wire was sent through this without difficulty.  Fluoroscopy showed the wire going across the innominate vein down the superior vena cava.  I then removed the needle.  A small stab incision was made.  Below this a 3-cm incision was made. Dissection was carried down to the fascia.  Small pocket was created with my finger bluntly.  An 8-French Power Port-A-Cath was then brought into the field was attached and flushed.  I tunneled the catheter from the lower incision to the wire insertion site.  The patient back in Trendelenburg.  I advanced the dilator over the wire without any resistance.  The wire moved to-and-fro.  I then placed the dilator introducer complex over the wire and advanced this  under direct vision, moving the wire to-and-fro with no resistance.  I then pulled the dilator and wire out and the introducer was then placed.  Catheter was placed and introducer and peel-away sheath was peeled away without difficulty.  Fluoroscopy showed the tip to be in the mid to distal superior vena cava.  There was no obvious hemo or pneumothorax that I could see on this.  Catheter was then interrogated.  I was able to draw back on it easily and got  dark blood.  It flushed easily without any resistance.  A 5 mL of 100 units/mL of heparinized saline was placed in the Port-A-Cath.  The skin incisions were then closed with a combination of 3-0 Vicryl and 4-0 Monocryl.  Dermabond was then applied.  The mastectomy was then done next.  Curvilinear incisions were made above and below the nipple areolar complex.  Cautery was used and dissection was carious under the superior flap to the clavicle and inferior flap to the inframammary fold.  We excised the breast and medial lateral fascia along the pectoralis major fascia into the axilla. In the axilla, we identified the subclavian vein, the long thoracic nerve and the thoracal dorsal trunk.  All were preserved.  All lymphovascular tissue was removed within these boundaries until this was clear of all lymphovascular tissue.  This was done with this mastectomy specimen.  It was sent to pathology.  The wounds were irrigated. Hemostasis was achieved with cautery and suture stick ties.  We then placed two round Blake drains into the mastectomy site.  These were secured to the skin with 2-0 nylon.  The skin incision was closed with combination of 3-0 Vicryl and 4-0 Monocryl.  Dermabond was applied.  All final counts of sponge, needle, instruments were found to be correct at this portion of the case.  The patient awoke, extubated, taken to recovery in satisfactory condition.     Alfie Alderfer A. Arvid Marengo, M.D.     TAC/MEDQ  D:  12/04/2010  T:  12/04/2010  Job:  [id number]  cc:   Lowella Dell, M.D.  Electronically Signed by [name] M.D. on 12/10/2010 08:08:19 AM

## 2010-12-12 ENCOUNTER — Other Ambulatory Visit: Payer: Self-pay | Admitting: Internal Medicine

## 2010-12-12 ENCOUNTER — Ambulatory Visit (INDEPENDENT_AMBULATORY_CARE_PROVIDER_SITE_OTHER): Payer: Self-pay | Admitting: Surgery

## 2010-12-12 ENCOUNTER — Encounter (INDEPENDENT_AMBULATORY_CARE_PROVIDER_SITE_OTHER): Payer: Self-pay | Admitting: Surgery

## 2010-12-12 DIAGNOSIS — Z9889 Other specified postprocedural states: Secondary | ICD-10-CM

## 2010-12-12 DIAGNOSIS — I10 Essential (primary) hypertension: Secondary | ICD-10-CM

## 2010-12-12 NOTE — Patient Instructions (Signed)
Follow up next week.  Keep a dry dressing around the drain site.  Expect some drainage.  Continue to empty drains.

## 2010-12-12 NOTE — Progress Notes (Signed)
The patient returns today one week status post right modified radical mastectomy. Her final pathology came back which showed a 1.8 cm multifocal tumor with the other side being about a centimeter. There is also extensive DCIS. She had 3 of 12 nodes involved with metastatic carcinoma. She developed bloody drainage from around her wound and he comes in today for a check. If doing okay until she gets and sweeping allowance of cocaine noticed some bloody drainage from under the bandage. Her bandages not been removed since the surgery.  On exam today the incision is intact. There is some bruising where appears to be an old hematoma that is draining through her drain site. There is some clear serous drainage from around the JP site and one JP is occluded. We were able to walk up today in the office. Port site is clean dry intact.  Status post right modified radical mastectomy with drainage from around her JP drain site and resolving old hematoma  However return next week. Total intake shower keep a dry dressing around the drains and inspection drainage from around the drain site from time to time. We have stripped her drains today in the office and that should help him work better. I see no signs of active bleeding at this.

## 2010-12-17 ENCOUNTER — Encounter (INDEPENDENT_AMBULATORY_CARE_PROVIDER_SITE_OTHER): Payer: Self-pay | Admitting: Surgery

## 2010-12-17 ENCOUNTER — Ambulatory Visit (INDEPENDENT_AMBULATORY_CARE_PROVIDER_SITE_OTHER): Payer: Self-pay | Admitting: Surgery

## 2010-12-17 DIAGNOSIS — Z9889 Other specified postprocedural states: Secondary | ICD-10-CM

## 2010-12-17 MED ORDER — DOXYCYCLINE HYCLATE 100 MG PO TABS: 100.0000 mg | ORAL_TABLET | Freq: Two times a day (BID) | ORAL | Status: AC

## 2010-12-17 NOTE — Patient Instructions (Signed)
Pack wound with gauze daily. Return in 1 week.  Take antibiotics.

## 2010-12-17 NOTE — Progress Notes (Signed)
The patient returns to clinic today. She is on is 2 weeks out from right modified radical mastectomy and Port-A-Cath placement for a multifocal T1 N1 MX Breast cancer ER PR positive. She's having some drainage from the incision. She denies any fever or chills. She's having mild discomfort around the incision.  On exam: I removed  her right sided drains today. She  has  skin necrosis along the middle of the incision  incision. I did debride it into  an open wound. There is no pus. There is minimal erythema. Laterally there is some skin slough off. I packed both these areas with  dry gauze.  Impression:  status post right breast modified radical mastectomy and Port-A-Cath placement  For a T1 multifocal N1 MX  ER PPOSITIVE BREAST CANCER.  Plan:  pack the small open areas of dry gauze and  change daily. Shower daily . I will see her back next week. I will start her on doxycycline 100 mg p.o. B.i.d.  She will need radiation oncology follow up.

## 2010-12-23 ENCOUNTER — Other Ambulatory Visit: Payer: Self-pay | Admitting: Oncology

## 2010-12-23 ENCOUNTER — Encounter (HOSPITAL_BASED_OUTPATIENT_CLINIC_OR_DEPARTMENT_OTHER): Payer: Self-pay | Admitting: Oncology

## 2010-12-23 DIAGNOSIS — C50319 Malignant neoplasm of lower-inner quadrant of unspecified female breast: Secondary | ICD-10-CM

## 2010-12-23 DIAGNOSIS — C773 Secondary and unspecified malignant neoplasm of axilla and upper limb lymph nodes: Secondary | ICD-10-CM

## 2010-12-23 LAB — COMPREHENSIVE METABOLIC PANEL
ALT: 13 U/L (ref 0–35)
AST: 13 U/L (ref 0–37)
Albumin: 3.9 g/dL (ref 3.5–5.2)
Alkaline Phosphatase: 95 U/L (ref 39–117)
BUN: 12 mg/dL (ref 6–23)
CO2: 26 mEq/L (ref 19–32)
Calcium: 9.3 mg/dL (ref 8.4–10.5)
Chloride: 102 mEq/L (ref 96–112)
Creatinine, Ser: 0.76 mg/dL (ref 0.50–1.10)
Glucose, Bld: 162 mg/dL — ABNORMAL HIGH (ref 70–99)
Potassium: 3.8 mEq/L (ref 3.5–5.3)
Sodium: 141 mEq/L (ref 135–145)
Total Bilirubin: 0.3 mg/dL (ref 0.3–1.2)
Total Protein: 6.2 g/dL (ref 6.0–8.3)

## 2010-12-23 LAB — CBC WITH DIFFERENTIAL/PLATELET
BASO%: 0.6 % (ref 0.0–2.0)
Basophils Absolute: 0 10*3/uL (ref 0.0–0.1)
EOS%: 0.9 % (ref 0.0–7.0)
Eosinophils Absolute: 0.1 10*3/uL (ref 0.0–0.5)
HCT: 27.3 % — ABNORMAL LOW (ref 34.8–46.6)
HGB: 9.1 g/dL — ABNORMAL LOW (ref 11.6–15.9)
LYMPH%: 27.2 % (ref 14.0–49.7)
MCH: 30.8 pg (ref 25.1–34.0)
MCHC: 33.1 g/dL (ref 31.5–36.0)
MCV: 92.9 fL (ref 79.5–101.0)
MONO#: 0.5 10*3/uL (ref 0.1–0.9)
MONO%: 6.8 % (ref 0.0–14.0)
NEUT#: 4.8 10*3/uL (ref 1.5–6.5)
NEUT%: 64.5 % (ref 38.4–76.8)
Platelets: 345 10*3/uL (ref 145–400)
RBC: 2.94 10*6/uL — ABNORMAL LOW (ref 3.70–5.45)
RDW: 17.8 % — ABNORMAL HIGH (ref 11.2–14.5)
WBC: 7.5 10*3/uL (ref 3.9–10.3)
lymph#: 2 10*3/uL (ref 0.9–3.3)

## 2010-12-23 LAB — HEMOGLOBIN A1C
Hgb A1c MFr Bld: 7.4 % — ABNORMAL HIGH (ref <5.7)
Mean Plasma Glucose: 166 mg/dL — ABNORMAL HIGH (ref <117)

## 2010-12-26 ENCOUNTER — Encounter (INDEPENDENT_AMBULATORY_CARE_PROVIDER_SITE_OTHER): Payer: Self-pay | Admitting: Surgery

## 2010-12-26 ENCOUNTER — Ambulatory Visit (INDEPENDENT_AMBULATORY_CARE_PROVIDER_SITE_OTHER): Payer: Self-pay | Admitting: Surgery

## 2010-12-26 DIAGNOSIS — Z9889 Other specified postprocedural states: Secondary | ICD-10-CM

## 2010-12-26 NOTE — Patient Instructions (Signed)
Kepp covered with dry dressings.  Follow up in two weeks.

## 2010-12-26 NOTE — Progress Notes (Signed)
The patient returns today for follow up.  She is doing well.  She does have some soreness of the wound to her right chest walt.  She denies any drainage,  Redness or swelling of the breast. Her pain is controlled. On exam today: Right mastectomy wound is minimal drainage. Lateral incision open with healthy appearing tissue. Flaps are viable. No signs of infection. Minimal fibrinous exudate noted.  Impression status post right modified radical mastectomy with axillary lymph node dissection for a T1N1MX right breast cancer  Plan: She will continue with dry dressings to the right mastectomy wound and followup in 2 weeks.

## 2010-12-27 ENCOUNTER — Encounter (INDEPENDENT_AMBULATORY_CARE_PROVIDER_SITE_OTHER): Payer: Self-pay | Admitting: Surgery

## 2011-01-08 ENCOUNTER — Ambulatory Visit: Payer: Self-pay | Admitting: Dietician

## 2011-01-08 ENCOUNTER — Encounter: Payer: Self-pay | Admitting: Internal Medicine

## 2011-01-14 ENCOUNTER — Other Ambulatory Visit: Payer: Self-pay | Admitting: Oncology

## 2011-01-14 ENCOUNTER — Encounter (HOSPITAL_BASED_OUTPATIENT_CLINIC_OR_DEPARTMENT_OTHER): Payer: Self-pay | Admitting: Oncology

## 2011-01-14 DIAGNOSIS — C773 Secondary and unspecified malignant neoplasm of axilla and upper limb lymph nodes: Secondary | ICD-10-CM

## 2011-01-14 DIAGNOSIS — C50319 Malignant neoplasm of lower-inner quadrant of unspecified female breast: Secondary | ICD-10-CM

## 2011-01-14 LAB — CBC WITH DIFFERENTIAL/PLATELET
BASO%: 0.3 % (ref 0.0–2.0)
Basophils Absolute: 0 10*3/uL (ref 0.0–0.1)
EOS%: 1.9 % (ref 0.0–7.0)
Eosinophils Absolute: 0.1 10*3/uL (ref 0.0–0.5)
HCT: 31.5 % — ABNORMAL LOW (ref 34.8–46.6)
HGB: 10.3 g/dL — ABNORMAL LOW (ref 11.6–15.9)
LYMPH%: 36 % (ref 14.0–49.7)
MCH: 29.5 pg (ref 25.1–34.0)
MCHC: 32.7 g/dL (ref 31.5–36.0)
MCV: 90.3 fL (ref 79.5–101.0)
MONO#: 0.4 10*3/uL (ref 0.1–0.9)
MONO%: 6.3 % (ref 0.0–14.0)
NEUT#: 3.5 10*3/uL (ref 1.5–6.5)
NEUT%: 55.5 % (ref 38.4–76.8)
Platelets: 247 10*3/uL (ref 145–400)
RBC: 3.49 10*6/uL — ABNORMAL LOW (ref 3.70–5.45)
RDW: 17 % — ABNORMAL HIGH (ref 11.2–14.5)
WBC: 6.3 10*3/uL (ref 3.9–10.3)
lymph#: 2.3 10*3/uL (ref 0.9–3.3)

## 2011-01-14 LAB — WHOLE BLOOD GLUCOSE
Glucose: 182 mg/dL — ABNORMAL HIGH (ref 70–100)
HRS PC: 0 Hours

## 2011-01-16 ENCOUNTER — Encounter (INDEPENDENT_AMBULATORY_CARE_PROVIDER_SITE_OTHER): Payer: Self-pay | Admitting: Surgery

## 2011-01-16 ENCOUNTER — Ambulatory Visit (INDEPENDENT_AMBULATORY_CARE_PROVIDER_SITE_OTHER): Payer: Self-pay | Admitting: Surgery

## 2011-01-16 VITALS — BP 170/96 | HR 88

## 2011-01-16 DIAGNOSIS — Z9889 Other specified postprocedural states: Secondary | ICD-10-CM

## 2011-01-16 NOTE — Patient Instructions (Signed)
Follow up in two months. Ok TO START CHEMOTHERAPY.

## 2011-01-16 NOTE — Progress Notes (Signed)
Patient returns to clinic today. She has no complaints. She is about 6 weeks out from a right modified radical mastectomy and Port-A-Cath placement. She has had a problem with flap necrosis on the right but this is improving. She expresses an interest in breast reconstruction at a later time.  Physical examination: Right mastectomy wound healing. 1 cm open area lateral part incision without significant drainage. Skin is viable. Port site clean dry and intact.  Impression: T1 C. N1 MX right breast cancer ER positive PR negative HER-2/neu negative with additional lesion in the same breast of DC IS status post right modified radical mastectomy  Plan: Her skin flaps had healed enough to begin chemotherapy. I will see her back in 2 months.

## 2011-01-20 ENCOUNTER — Encounter: Payer: Self-pay | Admitting: Internal Medicine

## 2011-01-20 ENCOUNTER — Ambulatory Visit (INDEPENDENT_AMBULATORY_CARE_PROVIDER_SITE_OTHER): Payer: Self-pay | Admitting: Internal Medicine

## 2011-01-20 ENCOUNTER — Encounter: Payer: Self-pay | Admitting: Dietician

## 2011-01-20 ENCOUNTER — Ambulatory Visit (INDEPENDENT_AMBULATORY_CARE_PROVIDER_SITE_OTHER): Payer: Self-pay | Admitting: Dietician

## 2011-01-20 DIAGNOSIS — D649 Anemia, unspecified: Secondary | ICD-10-CM

## 2011-01-20 DIAGNOSIS — E785 Hyperlipidemia, unspecified: Secondary | ICD-10-CM

## 2011-01-20 DIAGNOSIS — Z23 Encounter for immunization: Secondary | ICD-10-CM

## 2011-01-20 DIAGNOSIS — C773 Secondary and unspecified malignant neoplasm of axilla and upper limb lymph nodes: Secondary | ICD-10-CM

## 2011-01-20 DIAGNOSIS — C50919 Malignant neoplasm of unspecified site of unspecified female breast: Secondary | ICD-10-CM

## 2011-01-20 DIAGNOSIS — I10 Essential (primary) hypertension: Secondary | ICD-10-CM

## 2011-01-20 DIAGNOSIS — J309 Allergic rhinitis, unspecified: Secondary | ICD-10-CM

## 2011-01-20 DIAGNOSIS — E119 Type 2 diabetes mellitus without complications: Secondary | ICD-10-CM

## 2011-01-20 DIAGNOSIS — J302 Other seasonal allergic rhinitis: Secondary | ICD-10-CM

## 2011-01-20 LAB — GLUCOSE, CAPILLARY: Glucose-Capillary: 194 mg/dL — ABNORMAL HIGH (ref 70–99)

## 2011-01-20 MED ORDER — FLUTICASONE PROPIONATE 50 MCG/ACT NA SUSP: 2.0000 | Freq: Every day | NASAL | Status: DC

## 2011-01-20 MED ORDER — ASPIRIN 81 MG PO TABS: 81.0000 mg | ORAL_TABLET | Freq: Every day | ORAL | Status: DC

## 2011-01-20 NOTE — Progress Notes (Signed)
Subjective:   Patient ID: [name] female   DOB: 1945/05/28 65 y.o.   MRN: [id number]  HPI: [name] is a 66 y.o. woman that presents for a DM follow up.  She is s/p right modified mastectomy with axillary node dissection (T1N1MX) and is scheduled to begin chemotherapy tomorrow at the cancer center, but she does not know information regarding chemotherapy name.  She notes that she is doing better after surgery and that her wound is healing, but she does still feel overwhelmed at times and is having difficulty adjusting to everything.  She notes that Dr Luisa Hart, her surgeon, suggested she have her ovary removed in the future.  She notes that she sleeps well, but has a decreasing appetite for years.  She does read and walk for leisure, and notes that she has not lost interest in activities for fun- in fact she would like to restart photography and drawing.  She does note that on days that are busy or when she feels overwhelmed, she does not take medications.    She denies symptoms of hypoglycemia such as dizziness, lightheadedness, or palpitations.  She denies symptoms of hyperglycemia such as polydipsia and polyuria.    Past Medical History  Diagnosis Date  . Chest pain   . GERD (gastroesophageal reflux disease)   . Allergy   . Anemia   . Diabetes mellitus   . Hypertension   . Hyperlipidemia   . Fatty liver disease, nonalcoholic   . History of hyperkalemia   . Obesity   . Renal lesion   . Sleep disturbances   . Hemorrhoids   . Otitis media   . Abdominal pain, RUQ   . OA (osteoarthritis) of knee     Bilateral  . Breast ca     (Rt) breast ca dx 4/12   Current Outpatient Prescriptions  Medication Sig Dispense Refill  . aspirin 81 MG tablet Take 1 tablet (81 mg total) by mouth daily. Two tablets once daily  1 tablet  5  . hydrochlorothiazide 25 MG tablet Take 25 mg by mouth daily.        Marland Kitchen lisinopril (PRINIVIL,ZESTRIL) 40 MG tablet Take 1 tablet (40 mg total) by  mouth daily.  30 tablet  11  . metFORMIN (GLUCOPHAGE) 500 MG tablet Take 1 tablet (500 mg total) by mouth 2 (two) times daily with a meal.  60 tablet  3  . pravastatin (PRAVACHOL) 40 MG tablet TAKE ONE TABLET BY MOUTH EVERY DAY  31 tablet  5  . DISCONTD: aspirin 81 MG tablet Take 81 mg by mouth daily. Two tablets once daily       . fluticasone (FLONASE) 50 MCG/ACT nasal spray Place 2 sprays into the nose daily.  16 g  2   Family History  Problem Relation Age of Onset  . Stroke Father   . Heart disease Sister   . Heart disease Brother    History   Social History  . Marital Status: Married    Spouse Name: N/A    Number of Children: N/A  . Years of Education: N/A   Social History Main Topics  . Smoking status: Never Smoker   . Smokeless tobacco: None  . Alcohol Use: No  . Drug Use: No  . 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  Patient needs to submit further  paperwork to complete- Rudell Cobb 08/14/09.Financial assistance application completed.  Patient does not qualify for assistance-over income Rudell Cobb 09/12/09.   Review of Systems: Constitutional: Denies fever, chills, diaphoresis and fatigue.  HEENT: Denies photophobia, eye pain, redness, hearing loss, ear pain, congestion, sore throat, rhinorrhea, sneezing, mouth sores, trouble swallowing, neck pain, neck stiffness and tinnitus.   Respiratory: Denies SOB, DOE, cough, chest tightness,  and wheezing.   Cardiovascular: Denies chest pain, palpitations and leg swelling.  Gastrointestinal: Denies nausea, vomiting, abdominal pain, diarrhea, constipation, blood in stool and abdominal distention.  Genitourinary: Denies dysuria, urgency, frequency, hematuria, flank pain and difficulty urinating.  Musculoskeletal: Denies myalgias, back pain, joint swelling and gait  problem. Positive for chronic b/l knee pain Skin: Denies pallor, rash and wound.  Neurological: Denies dizziness, seizures, syncope, weakness, light-headedness, numbness and headaches.  Hematological: Denies adenopathy. Easy bruising, personal or family bleeding history  Psychiatric/Behavioral: Denies suicidal ideation, mood changes, confusion, nervousness, sleep disturbance and agitation  Objective:  Physical Exam: Filed Vitals:   01/20/11 0831  BP: 151/92  Pulse: 93  Temp: 98.2 F (36.8 C)  TempSrc: Oral  Weight: 167 lb 1.6 oz (75.796 kg)   General: well nourished, well kept, appears as stated age HEENT: PERRL, EOMI, no scleral icterus Cardiac: RRR, no rubs, murmurs or gallops Chest: Right mastectomy skin flap lesion healing, no purulent or erythematous discharge, clean Pulm: clear to auscultation bilaterally, moving normal volumes of air Abd: soft, nontender, nondistended, BS normoactive Ext: warm and well perfused, no pedal edema Neuro: alert and oriented X3, cranial nerves II-XII grossly intact, strength and sensation to light touch equal in bilateral upper and lower extremities  Assessment & Plan:   Case and care discussed with Dr. Coralee Pesa.  Please see problem oriented charting for further details.   Patient to return in 2 months for DM check, where we will also reassess anemia.   She has an appointment for diabetic education today.   Patient to start chemotherapy tomorrow.

## 2011-01-20 NOTE — Assessment & Plan Note (Signed)
Patient's A1c is improved, but still above goal (7.4).  She does not regularly check her blood glucose, and she does miss doses of her antiglycemic medications.  Her LDL was at goal (83) one year ago, she is not fasting today.  Her microalbumin/protein ratio was 8.6 in 03/2010.  She has never been a smoker.  Her last eye exam was with Dr. Mitzi Davenport in 2010.  A foot exam was done during today's exam.  She received a pneumovax vaccine in 01/2010.  Her renal function is WNL as per labs at hospital discharge in August 2012 (BUN-12, Cr-0.76). Flu vaccine given today.    Plan: Return in 2 months for follow up (A1c was drawn on Dec 23, 2010, so 2 months will be a 3 month DM check) Discontinue glipizide for now given erratic compliance, and I am unsure if patient has episodes of hypoglycemia as she also does not take CBGs at home regularly.  Assess at next visit if glipizide is necessary given improved diet, decreased appetite and increased exercise.    Patient will return for FLP 1-2 weeks before next visit.

## 2011-01-20 NOTE — Progress Notes (Signed)
Medical Nutrition Therapy:  Appt start time: 1000 end time:  1030.  Assessment:  Primary concerns today: Inadequate food intake and Meal planning Usual eating pattern includes Meal 1 and 3+ snacks per day. Concerned about decreased appetite, but is maintaining weight. Eats breakfast out daily, if late them may skip lunch 4 times a week. Cooks ~ 2x per week. Drinking Glucerna 5 times a week to replace meal. Doesn't check blood sugars very often because of the cost of strips. Forgets to take medicine for her diabetes a few days a week or forgets to take 2nd dose of metformin. Today was taken off glipizide. Chemo for breast cancer starts tomorrow. Avoided foods include: patient reports less fast food, still eats our at least one time daily.    Usual physical activity includes no formal activity plan  Diagnosis and Intervention:  Progress Towards Goal(s):  Some progress   Nutritional Diagnosis:  NI-5.8.3 Inappropriate intake of types of carbohydrates (specify): refined carbohydrates and soda  and NI-5.8.4 Inconsistent carbohydrate intake As related to skipping meals and not having a consistent pattern of food intake.  As evidenced by patient report of drinking soda to replace meals and food recall. .   Interventions: 1- Education regarding sick day rules, and when to call office about blood sugars. 2- Education regarding importance of spreading carb intake throughout day and encouraged higher quality carb intake. 3- Coordination of care- suggest extended release formulation of metformin.  4- Education regarding appropriate weight during and after chemotherapy    Monitoring/Evaluation:  Dietary intake prn

## 2011-01-20 NOTE — Assessment & Plan Note (Signed)
BP is currently elevated.  Patient notes erratic compliance with antihypertensive medications, so there is no role for adding another medication to her regimen at this time.  She understands the importance of taking these medications, and knows that it is up to her.  Plan: Continue HCTZ 25mg  daily & Lisinopril 40mg  daily

## 2011-01-20 NOTE — Assessment & Plan Note (Signed)
Patient underwent right modified mastectomy with axillary node dissection on 12/04/10 by Dr. [name].  Skin flap is healing well and is to undergo first treatment of chemotherapy tomorrow (01/21/11), under Dr. Russ Halo at the cancer center at [facility] long.  Plan: I asked that she bring information about her chemotherapy with her at her next appointment so we can have information in our system.

## 2011-01-20 NOTE — Assessment & Plan Note (Signed)
Patient notes erratic use of zyrtec.  Given that antihistamines can contribute to elevated BP and she has erratic compliance with antihypertensives as well, I will ask her to discontinue zyrtec and use flonase during allergy season, which she notes to be spring for her.

## 2011-01-20 NOTE — Assessment & Plan Note (Signed)
Last FLP was 08/2009, LDL = 83, at goal.  Patient has not been fasting at last 2 appointments.  Plan: Repeat FLP 1-2 weeks prior to next appointment. Continue Pravastatin 40. Continue ASA 81.

## 2011-01-20 NOTE — Assessment & Plan Note (Signed)
No new symptoms or signs.  Hb seems to be improving.  Unclear etiology.  Normocytic. Colonoscopy done 10/2009.  Plan: Recheck CBC 1-2 weeks prior to next visit when she is several weeks out from mastectomy.

## 2011-01-20 NOTE — Patient Instructions (Addendum)
-  Please return to have your blood drawn for your cholesterol before your next visit (about 1-2 weeks before).  Please be fasting for this appointment.  -Please return in 2 months for a diabetes check up.  Please bring glucometer to this visit as well.  -Please discontinue taking Glipizide for now.  We will re-assess if you require this medication at your next visit.    -Please discontinue zyrtec for now, as it can increase your blood pressure.  Please start using fluticasone nasal spray daily during allergy season.  This medication has to be used regularly to work, not as needed.  -Please bring information about your chemotherapy with you at your next visit.  -Please be sure to schedule your annual eye exam with Dr. Mitzi Davenport soon, and have results sent to [place].    Thank you!

## 2011-01-20 NOTE — Progress Notes (Signed)
I agree with assessment and plan as per Dr. Kapadia. 

## 2011-01-21 ENCOUNTER — Other Ambulatory Visit: Payer: Self-pay | Admitting: Oncology

## 2011-01-21 ENCOUNTER — Encounter (HOSPITAL_BASED_OUTPATIENT_CLINIC_OR_DEPARTMENT_OTHER): Payer: Self-pay | Admitting: Oncology

## 2011-01-21 DIAGNOSIS — C50319 Malignant neoplasm of lower-inner quadrant of unspecified female breast: Secondary | ICD-10-CM

## 2011-01-21 DIAGNOSIS — C773 Secondary and unspecified malignant neoplasm of axilla and upper limb lymph nodes: Secondary | ICD-10-CM

## 2011-01-21 DIAGNOSIS — Z5111 Encounter for antineoplastic chemotherapy: Secondary | ICD-10-CM

## 2011-01-21 LAB — CBC WITH DIFFERENTIAL/PLATELET
BASO%: 0.6 % (ref 0.0–2.0)
Basophils Absolute: 0 10*3/uL (ref 0.0–0.1)
EOS%: 1.3 % (ref 0.0–7.0)
Eosinophils Absolute: 0.1 10*3/uL (ref 0.0–0.5)
HCT: 33.8 % — ABNORMAL LOW (ref 34.8–46.6)
HGB: 10.9 g/dL — ABNORMAL LOW (ref 11.6–15.9)
LYMPH%: 34.7 % (ref 14.0–49.7)
MCH: 29.1 pg (ref 25.1–34.0)
MCHC: 32.2 g/dL (ref 31.5–36.0)
MCV: 90.4 fL (ref 79.5–101.0)
MONO#: 0.4 10*3/uL (ref 0.1–0.9)
MONO%: 7.9 % (ref 0.0–14.0)
NEUT#: 3 10*3/uL (ref 1.5–6.5)
NEUT%: 55.5 % (ref 38.4–76.8)
Platelets: 243 10*3/uL (ref 145–400)
RBC: 3.74 10*6/uL (ref 3.70–5.45)
RDW: 16.4 % — ABNORMAL HIGH (ref 11.2–14.5)
WBC: 5.5 10*3/uL (ref 3.9–10.3)
lymph#: 1.9 10*3/uL (ref 0.9–3.3)
nRBC: 0 % (ref 0–0)

## 2011-01-21 LAB — WHOLE BLOOD GLUCOSE
Glucose: 193 mg/dL — ABNORMAL HIGH (ref 70–100)
HRS PC: 1 Hours

## 2011-01-21 LAB — MICROALBUMIN / CREATININE URINE RATIO
Creatinine, Urine: 212.2 mg/dL
Microalb Creat Ratio: 4.9 mg/g (ref 0.0–30.0)
Microalb, Ur: 1.03 mg/dL (ref 0.00–1.89)

## 2011-01-24 ENCOUNTER — Encounter (HOSPITAL_BASED_OUTPATIENT_CLINIC_OR_DEPARTMENT_OTHER): Payer: Self-pay | Admitting: Oncology

## 2011-01-24 DIAGNOSIS — C50319 Malignant neoplasm of lower-inner quadrant of unspecified female breast: Secondary | ICD-10-CM

## 2011-01-24 DIAGNOSIS — C773 Secondary and unspecified malignant neoplasm of axilla and upper limb lymph nodes: Secondary | ICD-10-CM

## 2011-01-25 ENCOUNTER — Encounter: Payer: Self-pay | Admitting: Oncology

## 2011-01-28 ENCOUNTER — Other Ambulatory Visit: Payer: Self-pay | Admitting: Oncology

## 2011-01-28 ENCOUNTER — Encounter (HOSPITAL_BASED_OUTPATIENT_CLINIC_OR_DEPARTMENT_OTHER): Payer: No Typology Code available for payment source | Admitting: Oncology

## 2011-01-28 DIAGNOSIS — C773 Secondary and unspecified malignant neoplasm of axilla and upper limb lymph nodes: Secondary | ICD-10-CM

## 2011-01-28 DIAGNOSIS — C50319 Malignant neoplasm of lower-inner quadrant of unspecified female breast: Secondary | ICD-10-CM

## 2011-01-28 DIAGNOSIS — I10 Essential (primary) hypertension: Secondary | ICD-10-CM

## 2011-01-28 DIAGNOSIS — E119 Type 2 diabetes mellitus without complications: Secondary | ICD-10-CM

## 2011-01-28 DIAGNOSIS — I972 Postmastectomy lymphedema syndrome: Secondary | ICD-10-CM

## 2011-01-28 LAB — CBC WITH DIFFERENTIAL/PLATELET
BASO%: 0.2 % (ref 0.0–2.0)
Basophils Absolute: 0 10*3/uL (ref 0.0–0.1)
EOS%: 1.7 % (ref 0.0–7.0)
Eosinophils Absolute: 0.1 10*3/uL (ref 0.0–0.5)
HCT: 31.5 % — ABNORMAL LOW (ref 34.8–46.6)
HGB: 10.4 g/dL — ABNORMAL LOW (ref 11.6–15.9)
LYMPH%: 19.7 % (ref 14.0–49.7)
MCH: 30.4 pg (ref 25.1–34.0)
MCHC: 33.1 g/dL (ref 31.5–36.0)
MCV: 91.8 fL (ref 79.5–101.0)
MONO#: 0 10*3/uL — ABNORMAL LOW (ref 0.1–0.9)
MONO%: 0.4 % (ref 0.0–14.0)
NEUT#: 5.2 10*3/uL (ref 1.5–6.5)
NEUT%: 78 % — ABNORMAL HIGH (ref 38.4–76.8)
Platelets: 186 10*3/uL (ref 145–400)
RBC: 3.43 10*6/uL — ABNORMAL LOW (ref 3.70–5.45)
RDW: 17.8 % — ABNORMAL HIGH (ref 11.2–14.5)
WBC: 6.7 10*3/uL (ref 3.9–10.3)
lymph#: 1.3 10*3/uL (ref 0.9–3.3)

## 2011-01-28 LAB — COMPREHENSIVE METABOLIC PANEL
ALT: 15 U/L (ref 0–35)
AST: 11 U/L (ref 0–37)
Albumin: 4 g/dL (ref 3.5–5.2)
Alkaline Phosphatase: 101 U/L (ref 39–117)
BUN: 11 mg/dL (ref 6–23)
CO2: 31 mEq/L (ref 19–32)
Calcium: 9.3 mg/dL (ref 8.4–10.5)
Chloride: 97 mEq/L (ref 96–112)
Creatinine, Ser: 0.57 mg/dL (ref 0.50–1.10)
Glucose, Bld: 162 mg/dL — ABNORMAL HIGH (ref 70–99)
Potassium: 3.5 mEq/L (ref 3.5–5.3)
Sodium: 136 mEq/L (ref 135–145)
Total Bilirubin: 0.3 mg/dL (ref 0.3–1.2)
Total Protein: 6.8 g/dL (ref 6.0–8.3)

## 2011-02-03 ENCOUNTER — Telehealth: Payer: Self-pay | Admitting: *Deleted

## 2011-02-03 NOTE — Telephone Encounter (Signed)
Refill HCTZ

## 2011-02-04 ENCOUNTER — Encounter (HOSPITAL_BASED_OUTPATIENT_CLINIC_OR_DEPARTMENT_OTHER): Payer: Self-pay | Admitting: Oncology

## 2011-02-04 ENCOUNTER — Other Ambulatory Visit: Payer: Self-pay | Admitting: Oncology

## 2011-02-04 ENCOUNTER — Other Ambulatory Visit: Payer: Self-pay | Admitting: *Deleted

## 2011-02-04 DIAGNOSIS — C50319 Malignant neoplasm of lower-inner quadrant of unspecified female breast: Secondary | ICD-10-CM

## 2011-02-04 DIAGNOSIS — Z170 Estrogen receptor positive status [ER+]: Secondary | ICD-10-CM

## 2011-02-04 DIAGNOSIS — Z5111 Encounter for antineoplastic chemotherapy: Secondary | ICD-10-CM

## 2011-02-04 DIAGNOSIS — C773 Secondary and unspecified malignant neoplasm of axilla and upper limb lymph nodes: Secondary | ICD-10-CM

## 2011-02-04 DIAGNOSIS — I972 Postmastectomy lymphedema syndrome: Secondary | ICD-10-CM

## 2011-02-04 DIAGNOSIS — E119 Type 2 diabetes mellitus without complications: Secondary | ICD-10-CM

## 2011-02-04 LAB — COMPREHENSIVE METABOLIC PANEL
ALT: 16 U/L (ref 0–35)
AST: 13 U/L (ref 0–37)
Albumin: 4.2 g/dL (ref 3.5–5.2)
Alkaline Phosphatase: 122 U/L — ABNORMAL HIGH (ref 39–117)
BUN: 11 mg/dL (ref 6–23)
CO2: 31 mEq/L (ref 19–32)
Calcium: 9.7 mg/dL (ref 8.4–10.5)
Chloride: 97 mEq/L (ref 96–112)
Creatinine, Ser: 0.65 mg/dL (ref 0.50–1.10)
Glucose, Bld: 287 mg/dL — ABNORMAL HIGH (ref 70–99)
Potassium: 3.2 mEq/L — ABNORMAL LOW (ref 3.5–5.3)
Sodium: 138 mEq/L (ref 135–145)
Total Bilirubin: 0.1 mg/dL — ABNORMAL LOW (ref 0.3–1.2)
Total Protein: 7 g/dL (ref 6.0–8.3)

## 2011-02-04 LAB — CBC WITH DIFFERENTIAL/PLATELET
BASO%: 0.4 % (ref 0.0–2.0)
Basophils Absolute: 0.1 10*3/uL (ref 0.0–0.1)
EOS%: 0.5 % (ref 0.0–7.0)
Eosinophils Absolute: 0.1 10*3/uL (ref 0.0–0.5)
HCT: 31.7 % — ABNORMAL LOW (ref 34.8–46.6)
HGB: 10.4 g/dL — ABNORMAL LOW (ref 11.6–15.9)
LYMPH%: 18.3 % (ref 14.0–49.7)
MCH: 28.9 pg (ref 25.1–34.0)
MCHC: 32.8 g/dL (ref 31.5–36.0)
MCV: 88.1 fL (ref 79.5–101.0)
MONO#: 0.6 10*3/uL (ref 0.1–0.9)
MONO%: 4.4 % (ref 0.0–14.0)
NEUT#: 10 10*3/uL — ABNORMAL HIGH (ref 1.5–6.5)
NEUT%: 76.4 % (ref 38.4–76.8)
Platelets: 125 10*3/uL — ABNORMAL LOW (ref 145–400)
RBC: 3.6 10*6/uL — ABNORMAL LOW (ref 3.70–5.45)
RDW: 17 % — ABNORMAL HIGH (ref 11.2–14.5)
WBC: 13 10*3/uL — ABNORMAL HIGH (ref 3.9–10.3)
lymph#: 2.4 10*3/uL (ref 0.9–3.3)
nRBC: 1 % — ABNORMAL HIGH (ref 0–0)

## 2011-02-04 LAB — WHOLE BLOOD GLUCOSE
Glucose: 288 mg/dL — ABNORMAL HIGH (ref 70–100)
HRS PC: 2 Hours

## 2011-02-04 MED ORDER — METFORMIN HCL 500 MG PO TABS: 500.0000 mg | ORAL_TABLET | Freq: Two times a day (BID) | ORAL | Status: DC

## 2011-02-05 LAB — GLUCOSE, CAPILLARY: Glucose-Capillary: 164 — ABNORMAL HIGH

## 2011-02-06 ENCOUNTER — Other Ambulatory Visit: Payer: Self-pay | Admitting: Internal Medicine

## 2011-02-06 DIAGNOSIS — E119 Type 2 diabetes mellitus without complications: Secondary | ICD-10-CM

## 2011-02-06 MED ORDER — METFORMIN HCL 500 MG PO TABS: 500.0000 mg | ORAL_TABLET | Freq: Two times a day (BID) | ORAL | Status: DC

## 2011-02-06 MED ORDER — PRAVASTATIN SODIUM 40 MG PO TABS: 40.0000 mg | ORAL_TABLET | Freq: Every day | ORAL | Status: DC

## 2011-02-06 MED ORDER — GLIPIZIDE 10 MG PO TABS: 10.0000 mg | ORAL_TABLET | Freq: Two times a day (BID) | ORAL | Status: DC

## 2011-02-06 NOTE — Progress Notes (Signed)
I received correspondence from Zollie Scale, PA at the [facility] who conveyed concern regarding [name] blood sugars given 12mg  of dexamethasone IV as part of her premedications for chemo every other week.  I had discontinued glipizide at her last visit because her compliance was questionable and I was uncertain that she was receiving benefit from the drug.  I will restart her Glipizide today, given new use of steroids, and ask that she be sure to monitor CBG with glucometer for hypoglycemia.  I called and discussed this plan with the patient at 1:40pm on 02/06/11, and she agrees.  She also requested that her metformin and statin prescriptions be changed to a 90 day supply, so I will correct that at this time.

## 2011-02-07 ENCOUNTER — Other Ambulatory Visit: Payer: Self-pay | Admitting: Oncology

## 2011-02-07 ENCOUNTER — Encounter (HOSPITAL_BASED_OUTPATIENT_CLINIC_OR_DEPARTMENT_OTHER): Payer: Self-pay | Admitting: Oncology

## 2011-02-07 DIAGNOSIS — C50319 Malignant neoplasm of lower-inner quadrant of unspecified female breast: Secondary | ICD-10-CM

## 2011-02-07 DIAGNOSIS — I972 Postmastectomy lymphedema syndrome: Secondary | ICD-10-CM

## 2011-02-07 DIAGNOSIS — Z5189 Encounter for other specified aftercare: Secondary | ICD-10-CM

## 2011-02-07 DIAGNOSIS — C773 Secondary and unspecified malignant neoplasm of axilla and upper limb lymph nodes: Secondary | ICD-10-CM

## 2011-02-07 LAB — WHOLE BLOOD GLUCOSE
Glucose: 188 mg/dL — ABNORMAL HIGH (ref 70–100)
HRS PC: 1 Hours

## 2011-02-11 ENCOUNTER — Encounter (HOSPITAL_BASED_OUTPATIENT_CLINIC_OR_DEPARTMENT_OTHER): Payer: Self-pay | Admitting: Oncology

## 2011-02-11 ENCOUNTER — Other Ambulatory Visit: Payer: Self-pay | Admitting: Oncology

## 2011-02-11 DIAGNOSIS — C773 Secondary and unspecified malignant neoplasm of axilla and upper limb lymph nodes: Secondary | ICD-10-CM

## 2011-02-11 DIAGNOSIS — Z170 Estrogen receptor positive status [ER+]: Secondary | ICD-10-CM

## 2011-02-11 DIAGNOSIS — Z9010 Acquired absence of unspecified breast and nipple: Secondary | ICD-10-CM

## 2011-02-11 DIAGNOSIS — Z79899 Other long term (current) drug therapy: Secondary | ICD-10-CM

## 2011-02-11 DIAGNOSIS — C50919 Malignant neoplasm of unspecified site of unspecified female breast: Secondary | ICD-10-CM

## 2011-02-11 DIAGNOSIS — C50319 Malignant neoplasm of lower-inner quadrant of unspecified female breast: Secondary | ICD-10-CM

## 2011-02-11 LAB — CBC WITH DIFFERENTIAL/PLATELET
BASO%: 1.3 % (ref 0.0–2.0)
Basophils Absolute: 0 10*3/uL (ref 0.0–0.1)
EOS%: 0.9 % (ref 0.0–7.0)
Eosinophils Absolute: 0 10*3/uL (ref 0.0–0.5)
HCT: 31 % — ABNORMAL LOW (ref 34.8–46.6)
HGB: 10.1 g/dL — ABNORMAL LOW (ref 11.6–15.9)
LYMPH%: 35.3 % (ref 14.0–49.7)
MCH: 28.5 pg (ref 25.1–34.0)
MCHC: 32.6 g/dL (ref 31.5–36.0)
MCV: 87.6 fL (ref 79.5–101.0)
MONO#: 0.2 10*3/uL (ref 0.1–0.9)
MONO%: 5.3 % (ref 0.0–14.0)
NEUT#: 1.8 10*3/uL (ref 1.5–6.5)
NEUT%: 57.2 % (ref 38.4–76.8)
Platelets: 174 10*3/uL (ref 145–400)
RBC: 3.54 10*6/uL — ABNORMAL LOW (ref 3.70–5.45)
RDW: 17.4 % — ABNORMAL HIGH (ref 11.2–14.5)
WBC: 3.2 10*3/uL — ABNORMAL LOW (ref 3.9–10.3)
lymph#: 1.1 10*3/uL (ref 0.9–3.3)
nRBC: 0 % (ref 0–0)

## 2011-02-11 LAB — WHOLE BLOOD GLUCOSE
Glucose: 247 mg/dL — ABNORMAL HIGH (ref 70–100)
HRS PC: 1 Hours

## 2011-02-17 ENCOUNTER — Encounter (HOSPITAL_BASED_OUTPATIENT_CLINIC_OR_DEPARTMENT_OTHER): Payer: Self-pay | Admitting: Oncology

## 2011-02-17 ENCOUNTER — Other Ambulatory Visit: Payer: Self-pay | Admitting: Oncology

## 2011-02-17 DIAGNOSIS — I972 Postmastectomy lymphedema syndrome: Secondary | ICD-10-CM

## 2011-02-17 DIAGNOSIS — C773 Secondary and unspecified malignant neoplasm of axilla and upper limb lymph nodes: Secondary | ICD-10-CM

## 2011-02-17 DIAGNOSIS — C50319 Malignant neoplasm of lower-inner quadrant of unspecified female breast: Secondary | ICD-10-CM

## 2011-02-17 DIAGNOSIS — Z5111 Encounter for antineoplastic chemotherapy: Secondary | ICD-10-CM

## 2011-02-17 LAB — CBC WITH DIFFERENTIAL/PLATELET
BASO%: 0.4 % (ref 0.0–2.0)
Basophils Absolute: 0.1 10*3/uL (ref 0.0–0.1)
EOS%: 0.3 % (ref 0.0–7.0)
Eosinophils Absolute: 0 10*3/uL (ref 0.0–0.5)
HCT: 29.7 % — ABNORMAL LOW (ref 34.8–46.6)
HGB: 9.7 g/dL — ABNORMAL LOW (ref 11.6–15.9)
LYMPH%: 17.4 % (ref 14.0–49.7)
MCH: 28.5 pg (ref 25.1–34.0)
MCHC: 32.7 g/dL (ref 31.5–36.0)
MCV: 87.4 fL (ref 79.5–101.0)
MONO#: 0.7 10*3/uL (ref 0.1–0.9)
MONO%: 5.9 % (ref 0.0–14.0)
NEUT#: 8.7 10*3/uL — ABNORMAL HIGH (ref 1.5–6.5)
NEUT%: 76 % (ref 38.4–76.8)
Platelets: 214 10*3/uL (ref 145–400)
RBC: 3.4 10*6/uL — ABNORMAL LOW (ref 3.70–5.45)
RDW: 17.8 % — ABNORMAL HIGH (ref 11.2–14.5)
WBC: 11.5 10*3/uL — ABNORMAL HIGH (ref 3.9–10.3)
lymph#: 2 10*3/uL (ref 0.9–3.3)
nRBC: 1 % — ABNORMAL HIGH (ref 0–0)

## 2011-02-17 LAB — WHOLE BLOOD GLUCOSE
Glucose: 212 mg/dL — ABNORMAL HIGH (ref 70–100)
HRS PC: 2 Hours

## 2011-02-18 ENCOUNTER — Other Ambulatory Visit: Payer: Self-pay | Admitting: Oncology

## 2011-02-18 ENCOUNTER — Encounter (HOSPITAL_BASED_OUTPATIENT_CLINIC_OR_DEPARTMENT_OTHER): Payer: Self-pay | Admitting: Oncology

## 2011-02-18 DIAGNOSIS — Z5111 Encounter for antineoplastic chemotherapy: Secondary | ICD-10-CM

## 2011-02-18 DIAGNOSIS — I972 Postmastectomy lymphedema syndrome: Secondary | ICD-10-CM

## 2011-02-18 DIAGNOSIS — C50319 Malignant neoplasm of lower-inner quadrant of unspecified female breast: Secondary | ICD-10-CM

## 2011-02-18 DIAGNOSIS — C773 Secondary and unspecified malignant neoplasm of axilla and upper limb lymph nodes: Secondary | ICD-10-CM

## 2011-02-18 LAB — WHOLE BLOOD GLUCOSE
Glucose: 134 mg/dL — ABNORMAL HIGH (ref 70–100)
HRS PC: 12 Hours

## 2011-02-21 ENCOUNTER — Other Ambulatory Visit: Payer: Self-pay | Admitting: Physician Assistant

## 2011-02-21 ENCOUNTER — Encounter (HOSPITAL_BASED_OUTPATIENT_CLINIC_OR_DEPARTMENT_OTHER): Payer: Self-pay | Admitting: Oncology

## 2011-02-21 ENCOUNTER — Other Ambulatory Visit: Payer: Self-pay | Admitting: Oncology

## 2011-02-21 DIAGNOSIS — I972 Postmastectomy lymphedema syndrome: Secondary | ICD-10-CM

## 2011-02-21 DIAGNOSIS — C50319 Malignant neoplasm of lower-inner quadrant of unspecified female breast: Secondary | ICD-10-CM

## 2011-02-21 DIAGNOSIS — C773 Secondary and unspecified malignant neoplasm of axilla and upper limb lymph nodes: Secondary | ICD-10-CM

## 2011-02-21 LAB — WHOLE BLOOD GLUCOSE
Glucose: 171 mg/dL — ABNORMAL HIGH (ref 70–100)
HRS PC: 0.5 Hours

## 2011-02-23 ENCOUNTER — Other Ambulatory Visit: Payer: Self-pay | Admitting: Physician Assistant

## 2011-02-23 DIAGNOSIS — C50919 Malignant neoplasm of unspecified site of unspecified female breast: Secondary | ICD-10-CM

## 2011-02-24 ENCOUNTER — Encounter (HOSPITAL_BASED_OUTPATIENT_CLINIC_OR_DEPARTMENT_OTHER): Payer: Self-pay | Admitting: Oncology

## 2011-02-24 ENCOUNTER — Other Ambulatory Visit: Payer: Self-pay | Admitting: Oncology

## 2011-02-24 DIAGNOSIS — I972 Postmastectomy lymphedema syndrome: Secondary | ICD-10-CM

## 2011-02-24 DIAGNOSIS — Z5111 Encounter for antineoplastic chemotherapy: Secondary | ICD-10-CM

## 2011-02-24 DIAGNOSIS — C773 Secondary and unspecified malignant neoplasm of axilla and upper limb lymph nodes: Secondary | ICD-10-CM

## 2011-02-24 DIAGNOSIS — C50319 Malignant neoplasm of lower-inner quadrant of unspecified female breast: Secondary | ICD-10-CM

## 2011-02-24 LAB — CBC WITH DIFFERENTIAL/PLATELET
BASO%: 0.3 % (ref 0.0–2.0)
Basophils Absolute: 0 10*3/uL (ref 0.0–0.1)
EOS%: 0.3 % (ref 0.0–7.0)
Eosinophils Absolute: 0 10*3/uL (ref 0.0–0.5)
HCT: 27.2 % — ABNORMAL LOW (ref 34.8–46.6)
HGB: 9 g/dL — ABNORMAL LOW (ref 11.6–15.9)
LYMPH%: 8.9 % — ABNORMAL LOW (ref 14.0–49.7)
MCH: 28.6 pg (ref 25.1–34.0)
MCHC: 33.1 g/dL (ref 31.5–36.0)
MCV: 86.3 fL (ref 79.5–101.0)
MONO#: 0.1 10*3/uL (ref 0.1–0.9)
MONO%: 0.9 % (ref 0.0–14.0)
NEUT#: 6.9 10*3/uL — ABNORMAL HIGH (ref 1.5–6.5)
NEUT%: 89.6 % — ABNORMAL HIGH (ref 38.4–76.8)
Platelets: 163 10*3/uL (ref 145–400)
RBC: 3.15 10*6/uL — ABNORMAL LOW (ref 3.70–5.45)
RDW: 18.5 % — ABNORMAL HIGH (ref 11.2–14.5)
WBC: 7.7 10*3/uL (ref 3.9–10.3)
lymph#: 0.7 10*3/uL — ABNORMAL LOW (ref 0.9–3.3)

## 2011-02-24 LAB — COMPREHENSIVE METABOLIC PANEL
ALT: 15 U/L (ref 0–35)
AST: 14 U/L (ref 0–37)
Albumin: 3.7 g/dL (ref 3.5–5.2)
Alkaline Phosphatase: 127 U/L — ABNORMAL HIGH (ref 39–117)
BUN: 11 mg/dL (ref 6–23)
CO2: 27 mEq/L (ref 19–32)
Calcium: 9.1 mg/dL (ref 8.4–10.5)
Chloride: 102 mEq/L (ref 96–112)
Creatinine, Ser: 0.6 mg/dL (ref 0.50–1.10)
Glucose, Bld: 239 mg/dL — ABNORMAL HIGH (ref 70–99)
Potassium: 3.6 mEq/L (ref 3.5–5.3)
Sodium: 138 mEq/L (ref 135–145)
Total Bilirubin: 0.2 mg/dL — ABNORMAL LOW (ref 0.3–1.2)
Total Protein: 6.6 g/dL (ref 6.0–8.3)

## 2011-02-24 LAB — WHOLE BLOOD GLUCOSE
Glucose: 246 mg/dL — ABNORMAL HIGH (ref 70–100)
HRS PC: 3 Hours

## 2011-03-04 ENCOUNTER — Other Ambulatory Visit: Payer: Self-pay | Admitting: Oncology

## 2011-03-04 ENCOUNTER — Ambulatory Visit (HOSPITAL_COMMUNITY)
Admission: RE | Admit: 2011-03-04 | Discharge: 2011-03-04 | Disposition: A | Discharge status: Home / Self Care | Payer: Self-pay | Source: Ambulatory Visit | Attending: Oncology | Admitting: Oncology

## 2011-03-04 ENCOUNTER — Encounter (HOSPITAL_BASED_OUTPATIENT_CLINIC_OR_DEPARTMENT_OTHER): Payer: Self-pay | Admitting: Oncology

## 2011-03-04 DIAGNOSIS — R609 Edema, unspecified: Secondary | ICD-10-CM

## 2011-03-04 DIAGNOSIS — C773 Secondary and unspecified malignant neoplasm of axilla and upper limb lymph nodes: Secondary | ICD-10-CM

## 2011-03-04 DIAGNOSIS — T82598A Other mechanical complication of other cardiac and vascular devices and implants, initial encounter: Secondary | ICD-10-CM | POA: Insufficient documentation

## 2011-03-04 DIAGNOSIS — C50319 Malignant neoplasm of lower-inner quadrant of unspecified female breast: Secondary | ICD-10-CM

## 2011-03-04 DIAGNOSIS — Y849 Medical procedure, unspecified as the cause of abnormal reaction of the patient, or of later complication, without mention of misadventure at the time of the procedure: Secondary | ICD-10-CM | POA: Insufficient documentation

## 2011-03-04 DIAGNOSIS — Z5111 Encounter for antineoplastic chemotherapy: Secondary | ICD-10-CM

## 2011-03-04 LAB — CBC WITH DIFFERENTIAL/PLATELET
BASO%: 0.9 % (ref 0.0–2.0)
Basophils Absolute: 0.1 10*3/uL (ref 0.0–0.1)
EOS%: 0.6 % (ref 0.0–7.0)
Eosinophils Absolute: 0 10*3/uL (ref 0.0–0.5)
HCT: 29.8 % — ABNORMAL LOW (ref 34.8–46.6)
HGB: 9.6 g/dL — ABNORMAL LOW (ref 11.6–15.9)
LYMPH%: 23.5 % (ref 14.0–49.7)
MCH: 28.2 pg (ref 25.1–34.0)
MCHC: 32.2 g/dL (ref 31.5–36.0)
MCV: 87.4 fL (ref 79.5–101.0)
MONO#: 0.8 10*3/uL (ref 0.1–0.9)
MONO%: 11.3 % (ref 0.0–14.0)
NEUT#: 4.2 10*3/uL (ref 1.5–6.5)
NEUT%: 63.7 % (ref 38.4–76.8)
Platelets: 245 10*3/uL (ref 145–400)
RBC: 3.41 10*6/uL — ABNORMAL LOW (ref 3.70–5.45)
RDW: 19.4 % — ABNORMAL HIGH (ref 11.2–14.5)
WBC: 6.6 10*3/uL (ref 3.9–10.3)
lymph#: 1.6 10*3/uL (ref 0.9–3.3)
nRBC: 1 % — ABNORMAL HIGH (ref 0–0)

## 2011-03-04 LAB — WHOLE BLOOD GLUCOSE: Glucose: 119 mg/dL — ABNORMAL HIGH (ref 70–100)

## 2011-03-04 MED ORDER — IOHEXOL 300 MG/ML  SOLN: 10.0000 mL | Freq: Once | INTRAMUSCULAR | Status: AC | PRN

## 2011-03-06 ENCOUNTER — Ambulatory Visit (HOSPITAL_COMMUNITY)
Admission: RE | Admit: 2011-03-06 | Discharge: 2011-03-06 | Disposition: A | Discharge status: Home / Self Care | Payer: Self-pay | Source: Ambulatory Visit | Attending: Surgery | Admitting: Surgery

## 2011-03-06 DIAGNOSIS — Z01812 Encounter for preprocedural laboratory examination: Secondary | ICD-10-CM | POA: Insufficient documentation

## 2011-03-06 LAB — CBC
HCT: 30.1 % — ABNORMAL LOW (ref 36.0–46.0)
Hemoglobin: 10 g/dL — ABNORMAL LOW (ref 12.0–15.0)
MCH: 29.3 pg (ref 26.0–34.0)
MCHC: 33.2 g/dL (ref 30.0–36.0)
MCV: 88.3 fL (ref 78.0–100.0)
Platelets: 262 10*3/uL (ref 150–400)
RBC: 3.41 MIL/uL — ABNORMAL LOW (ref 3.87–5.11)
RDW: 19.4 % — ABNORMAL HIGH (ref 11.5–15.5)
WBC: 6.9 10*3/uL (ref 4.0–10.5)

## 2011-03-06 LAB — SURGICAL PCR SCREEN
MRSA, PCR: NEGATIVE
Staphylococcus aureus: NEGATIVE

## 2011-03-06 LAB — BASIC METABOLIC PANEL
BUN: 10 mg/dL (ref 6–23)
CO2: 28 mEq/L (ref 19–32)
Calcium: 9.9 mg/dL (ref 8.4–10.5)
Chloride: 104 mEq/L (ref 96–112)
Creatinine, Ser: 0.66 mg/dL (ref 0.50–1.10)
GFR calc Af Amer: >90 mL/min (ref >90)
GFR calc non Af Amer: >90 mL/min (ref >90)
Glucose, Bld: 131 mg/dL — ABNORMAL HIGH (ref 70–99)
Potassium: 4 mEq/L (ref 3.5–5.1)
Sodium: 142 mEq/L (ref 135–145)

## 2011-03-06 LAB — GLUCOSE, CAPILLARY
Glucose-Capillary: 141 mg/dL — ABNORMAL HIGH (ref 70–99)
Glucose-Capillary: 115 mg/dL — ABNORMAL HIGH (ref 70–99)

## 2011-03-07 ENCOUNTER — Ambulatory Visit (HOSPITAL_COMMUNITY): Payer: Self-pay

## 2011-03-07 ENCOUNTER — Ambulatory Visit (HOSPITAL_COMMUNITY)
Admission: RE | Admit: 2011-03-07 | Discharge: 2011-03-07 | Disposition: A | Discharge status: Home / Self Care | Payer: Self-pay | Source: Ambulatory Visit | Attending: Surgery | Admitting: Surgery

## 2011-03-07 DIAGNOSIS — E119 Type 2 diabetes mellitus without complications: Secondary | ICD-10-CM | POA: Insufficient documentation

## 2011-03-07 DIAGNOSIS — T82898A Other specified complication of vascular prosthetic devices, implants and grafts, initial encounter: Secondary | ICD-10-CM

## 2011-03-07 DIAGNOSIS — I10 Essential (primary) hypertension: Secondary | ICD-10-CM | POA: Insufficient documentation

## 2011-03-07 DIAGNOSIS — Y849 Medical procedure, unspecified as the cause of abnormal reaction of the patient, or of later complication, without mention of misadventure at the time of the procedure: Secondary | ICD-10-CM | POA: Insufficient documentation

## 2011-03-07 DIAGNOSIS — Z452 Encounter for adjustment and management of vascular access device: Secondary | ICD-10-CM

## 2011-03-07 DIAGNOSIS — C50919 Malignant neoplasm of unspecified site of unspecified female breast: Secondary | ICD-10-CM | POA: Insufficient documentation

## 2011-03-07 DIAGNOSIS — E669 Obesity, unspecified: Secondary | ICD-10-CM | POA: Insufficient documentation

## 2011-03-07 LAB — GLUCOSE, CAPILLARY
Glucose-Capillary: 132 mg/dL — ABNORMAL HIGH (ref 70–99)
Glucose-Capillary: 105 mg/dL — ABNORMAL HIGH (ref 70–99)
Glucose-Capillary: 106 mg/dL — ABNORMAL HIGH (ref 70–99)

## 2011-03-08 NOTE — Op Note (Signed)
[name], [name]             ACCOUNT NO.:  [id number]  MEDICAL RECORD NO.:  [id number]  LOCATION:  [facility]SC                         FACILITY:  [facility]  PHYSICIAN:  [name], M.D.DATE OF BIRTH:  25-Sep-1945  DATE OF PROCEDURE:  03/07/2011 DATE OF DISCHARGE:  03/07/2011                              OPERATIVE REPORT   PREOPERATIVE DIAGNOSIS:  Malfunctioning left subclavian catheter.  POSTOPERATIVE DIAGNOSIS:  Occluded left subclavian Port-A-Cath with fracture at about 8 cm of the catheter.  PROCEDURES: 1. Explantation of old left subclavian Port-A-Cath. 2. Placement of left subclavian 8-French power Port-A-Cath with     fluoroscopic guidance.  SURGEON:  Jillian Fus A. Toren Tucholski, MD  ANESTHESIA:  LMA 0.25% Sensorcaine local.  ESTIMATED BLOOD LOSS:  About 50 mL.  DRAINS:  None.  INDICATIONS FOR PROCEDURE:  The patient is a 65 year old female who had a Port-A-Cath placed in August 2012, for chemotherapy.  It is no longer functioning and I was asked to replace it for continued chemotherapy. Complications of bleeding, infection, pneumothorax, hemothorax, pericardial tamponade, injury to major vascular structures, injury to major venous structures, injury to major nerve structures, pulmonary complications, blood clots, embolization of clot, embolization of catheter, and the need for recurrent surgeries are all potential complications with the patient is aware of.  She agrees to proceed. Alternative will be PICC line placed and that she will require prolonged chemotherapy, this is not a good option for her.  DESCRIPTION OF PROCEDURE:  The patient was brought to the operating room.  After induction of anesthesia, she was placed in supine with both arms tucked.  After induction of general anesthesia, she was prepped and draped in sterile fashion.  Time-out was done.  She received 2 g of Ancef.  Incision was made over the old left Port-A-Cath site. Dissection was carried down to  find the hub and I delivered the hub _.  I went ahead and cut the catheter at the hub using a hemostat to control the catheter and passed the hub off the field. There was clot thrombus in the catheter.  I then attempted to advance a wire through the catheter, but could not due to resistance and stopped at about 5 cm.  I went ahead and removed the old catheter and it was intact.  Of note, there is a longitudinal  a 5-mm hole in the catheter which corresponds to catheter malfunction.  This was  about 10 cm from the hub.  I then put the patient in Trendelenburg.  I went ahead and cannulated the left subclavian vein with the needle under the clavicle without difficulty.  I got a return of dark blood.  Wire fed down the needle quite easily.  Fluoroscopic exam was done which showed the wire in the superior vena cava going down in to the inferior vena cava.  I then brought a new 8-French power port on to the field, I attached and flushed it.  I tunneled from the lower incision to the upper incision and put the hub in the previous port site.  I then cut the catheter to about 22 cm.  Then with the patient in Trendelenburg, I advanced a dilator over the wire, moving the  wire to and fro with no resistance.  I then put the dilator introducer complex over the wire, advanced it in to place without difficulty.  I removed the dilator and wire, leaving introducer in place.  The catheter was then put to the peel-away sheath and the sheath could be peeled away without difficulty. Tip of the catheter by fluoroscopy appeared to be in the mid superior vena cava.  It drew back easily and drew back dark nonpulsatile blood. It flushed easily with heparinized saline.  A 5 mL of 100 units/mL of heparinized saline were left in the port.  The port itself was secured to the chest wall with 2-0 Prolene.  We then closed the wound with a deep layer of 3-0 Vicryl and subsequent 4-0 Monocryl stitch.  Dermabond was applied.   All final counts of sponge, needle, instruments were found to be correct at this portion of the case.  By fluoroscopic exam, there is no obvious pneumothorax on the right nor was there any pneumothorax on the left.  The tip of the catheter appeared to be in the mid superior vena cava.  She was then awoke, taken from the operating to recovery in satisfactory condition.  All final counts were found to be correct. Chest x-ray in PACU.     [name], M.D.     TAC/MEDQ  D:  03/07/2011  T:  03/07/2011  Job:  [id number]  cc:   [name], M.D.  Electronically Signed by [name] M.D. on 03/08/2011 01:12:09 PM

## 2011-03-10 ENCOUNTER — Other Ambulatory Visit: Payer: Self-pay

## 2011-03-11 ENCOUNTER — Ambulatory Visit (HOSPITAL_BASED_OUTPATIENT_CLINIC_OR_DEPARTMENT_OTHER): Payer: Self-pay | Admitting: Physician Assistant

## 2011-03-11 ENCOUNTER — Other Ambulatory Visit (HOSPITAL_BASED_OUTPATIENT_CLINIC_OR_DEPARTMENT_OTHER): Payer: Self-pay

## 2011-03-11 ENCOUNTER — Telehealth: Payer: Self-pay | Admitting: *Deleted

## 2011-03-11 ENCOUNTER — Other Ambulatory Visit: Payer: Self-pay | Admitting: Oncology

## 2011-03-11 ENCOUNTER — Encounter: Payer: Self-pay | Admitting: Physician Assistant

## 2011-03-11 VITALS — BP 142/83 | HR 89 | Temp 97.5°F | Ht 58.5 in | Wt 160.6 lb

## 2011-03-11 DIAGNOSIS — C773 Secondary and unspecified malignant neoplasm of axilla and upper limb lymph nodes: Secondary | ICD-10-CM

## 2011-03-11 DIAGNOSIS — C50919 Malignant neoplasm of unspecified site of unspecified female breast: Secondary | ICD-10-CM

## 2011-03-11 DIAGNOSIS — C50319 Malignant neoplasm of lower-inner quadrant of unspecified female breast: Secondary | ICD-10-CM

## 2011-03-11 DIAGNOSIS — Z170 Estrogen receptor positive status [ER+]: Secondary | ICD-10-CM

## 2011-03-11 LAB — CBC WITH DIFFERENTIAL/PLATELET
BASO%: 0.6 % (ref 0.0–2.0)
Basophils Absolute: 0 10*3/uL (ref 0.0–0.1)
EOS%: 1.8 % (ref 0.0–7.0)
Eosinophils Absolute: 0.1 10*3/uL (ref 0.0–0.5)
HCT: 30.3 % — ABNORMAL LOW (ref 34.8–46.6)
HGB: 10 g/dL — ABNORMAL LOW (ref 11.6–15.9)
LYMPH%: 28.7 % (ref 14.0–49.7)
MCH: 29.9 pg (ref 25.1–34.0)
MCHC: 33.1 g/dL (ref 31.5–36.0)
MCV: 90.4 fL (ref 79.5–101.0)
MONO#: 0.4 10*3/uL (ref 0.1–0.9)
MONO%: 9.6 % (ref 0.0–14.0)
NEUT#: 2.7 10*3/uL (ref 1.5–6.5)
NEUT%: 59.3 % (ref 38.4–76.8)
Platelets: 336 10*3/uL (ref 145–400)
RBC: 3.35 10*6/uL — ABNORMAL LOW (ref 3.70–5.45)
RDW: 21.7 % — ABNORMAL HIGH (ref 11.2–14.5)
WBC: 4.5 10*3/uL (ref 3.9–10.3)
lymph#: 1.3 10*3/uL (ref 0.9–3.3)

## 2011-03-11 LAB — COMPREHENSIVE METABOLIC PANEL
ALT: 15 U/L (ref 0–35)
AST: 15 U/L (ref 0–37)
Albumin: 4.6 g/dL (ref 3.5–5.2)
Alkaline Phosphatase: 57 U/L (ref 39–117)
BUN: 10 mg/dL (ref 6–23)
CO2: 29 mEq/L (ref 19–32)
Calcium: 9.7 mg/dL (ref 8.4–10.5)
Chloride: 99 mEq/L (ref 96–112)
Creatinine, Ser: 0.73 mg/dL (ref 0.50–1.10)
Glucose, Bld: 143 mg/dL — ABNORMAL HIGH (ref 70–99)
Potassium: 3.6 mEq/L (ref 3.5–5.3)
Sodium: 139 mEq/L (ref 135–145)
Total Bilirubin: 0.3 mg/dL (ref 0.3–1.2)
Total Protein: 6.7 g/dL (ref 6.0–8.3)

## 2011-03-11 LAB — HEMOGLOBIN A1C
Hgb A1c MFr Bld: 8.1 % — ABNORMAL HIGH (ref <5.7)
Mean Plasma Glucose: 186 mg/dL — ABNORMAL HIGH (ref <117)

## 2011-03-11 LAB — WHOLE BLOOD GLUCOSE
Glucose: 165 mg/dL — ABNORMAL HIGH (ref 70–100)
HRS PC: 2 Hours

## 2011-03-11 NOTE — Progress Notes (Signed)
Hematology and Oncology Follow Up Visit  [name] [id number] 25-Dec-1945 65 y.o. 03/11/2011 1:03 PM   Interim History:   Patient returns today for followup of her right breast carcinoma. She was scheduled for her fourth and final dose of doxorubicin/cyclophosphamide last week on October 30. Unfortunately they were unable to access the patient's port. A dye study was subsequently done showing an occlusion, and her port was replaced on Thursday, November 1. Although the patient tolerated the procedure well, and has had no complications, she is extremely sore. Recall that she receives her doxorubicin by continuous infusion over 72 hours. She does not feel that she could tolerate the palm connected to her very sore port at this time. She would be due for her fourth and final dose dense cycle of doxorubicin/cyclophosphamide today, but per patient request this is being held. Over half of our 30 minute appointment today was spent discussing the patient's treatment plan and answering her questions.  With the exception of the tenderness around the port, a detailed review of systems is noncontributory today. I will mention that she has had no significant swelling around the port, no drainage, no erythema, and no evidence of infection. Patient's only other complaints are some mild fatigue and redness of the eyes. She tells me she has seen her ophthalmologist this week and was told that this was not conjunctivitis, but was "diabetic eyes" which is basically extreme dryness per her report. The patient continues to have some mild nausea on occasion which is not a new complaint. Fortunately she's had no emesis. She notes no change in bowel habits.  Review of Systems: Constitutional:  no weight loss, fever, night sweats Eyes: dry eyes [id number] Cardiovascular: no chest pain or dyspnea on exertion Respiratory: no cough, shortness of breath, or wheezing Neurological: negative Dermatological:  negative Gastrointestinal: no abdominal pain, change in bowel habits, or black or bloody stools Genito-Urinary: no dysuria, trouble voiding, or hematuria Hematological and Lymphatic: negative Breast: negative Musculoskeletal: negative Remaining ROS negative.  Medications: I have reviewed the patient's current medications.  Allergies:  Allergies  Allergen Reactions  . Metformin Other (See Comments)    REACTION, diarrhea: 850 mg - diarrhea but tolerates 500 mg daily     Physical Exam: Blood pressure 142/83, pulse 89, temperature 97.5 F (36.4 C), height 4' 10.5" (1.486 m), weight 160 lb 9.6 oz (72.848 kg). HEENT:  Sclerae anicteric, conjunctivae red. No drainage. Oropharynx clear.  No mucositis or candidiasis.  Nodes:  No cervical, supraclavicular, or axillary lymphadenopathy palpated.  Breast Exam: Deferred.  Lungs:  Clear to auscultation bilaterally.  No crackles, rhonchi, or wheezes.  Heart:  Regular rate and rhythm.  Abdomen:  Soft, nontender.  Positive bowel sounds.  No organomegaly or masses palpated.  Musculoskeletal:  No focal spinal tenderness to palpation.  Extremities:  Benign.  No peripheral edema or cyanosis.  Skin:  Benign.  Neuro:  Nonfocal.   Lab Results: Lab Results  Component Value Date   WBC 6.9 03/06/2011   HGB 10.0* 03/11/2011   HCT 30.3* 03/11/2011   MCV 90.4 03/11/2011   PLT 336 03/11/2011     Chemistry      Component Value Date/Time   NA 142 03/06/2011 1033   K 4.0 03/06/2011 1033   CL 104 03/06/2011 1033   CO2 28 03/06/2011 1033   BUN 10 03/06/2011 1033   CREATININE 0.66 03/06/2011 1033   GLU 165* 03/11/2011 1114      Component Value Date/Time   CALCIUM  9.9 03/06/2011 1033   ALKPHOS 127* 02/24/2011 1103   AST 14 02/24/2011 1103   ALT 15 02/24/2011 1103   BILITOT 0.2* 02/24/2011 1103        Impression and Plan: 65 year old [place] woman BRCA2 positive, status post right mastectomy and axillary lymph node dissection in August 2012, for a T1CN1,  grade 3, invasive ductal carcinoma. ER and PR positive, HER-2/neu negative, with MIB-1 of 62%. Currently being treated in the adjuvant setting. Cycle 4 of doxorubicin/cyclophosphamide was held one week ago due to problems with the port which has been replaced. Currently due for day 1 cycle 4 of 4 planned distant cycles of doxorubicin/cyclophosphamide today. Doxorubicin is given by continuous infusion over 72 hours, with Neulasta given on day 5 for granulocyte support. The plan is to complete 4 doses of AC to be followed by 12 weekly doses of paclitaxel.  As noted above, the patient chooses not to be treated today and would like to have another week to recover from her port insertion. Again the area is very tender, although there're no signs of infection, and she simply does not feel that she could tolerate the pump for 3 days. Accordingly we will defer her fourth and final dose of doxorubicin and cyclophosphamide to next week when she returns to see me on  November 13. Her pump will be discontinued on Friday, November 16. She will receive her Neulasta injection on Saturday, November 17. She will have labs only the following week and will return for labs and followup on November 27 prior to her first weekly dose of paclitaxel.  All of this was reviewed with the patient and she voices understanding and agreement with this plan. She knows to call with any changes or problems.  Spent more than half the time coordinating care.    Azha Constantin, PA-C 11/6/20121:03 PM

## 2011-03-11 NOTE — Telephone Encounter (Signed)
GAVE PATIENT APPOINTMENT FOR 04-2011

## 2011-03-12 NOTE — Progress Notes (Signed)
Addended by: Bufford Spikes on: 03/12/2011 03:56 PM   Modules accepted: Orders

## 2011-03-13 ENCOUNTER — Emergency Department (HOSPITAL_COMMUNITY)
Admission: EM | Admit: 2011-03-13 | Discharge: 2011-03-13 | Disposition: A | Discharge status: Home / Self Care | Payer: Self-pay | Attending: Emergency Medicine | Admitting: Emergency Medicine

## 2011-03-13 ENCOUNTER — Encounter (HOSPITAL_COMMUNITY): Payer: Self-pay | Admitting: *Deleted

## 2011-03-13 ENCOUNTER — Emergency Department (HOSPITAL_COMMUNITY): Payer: Self-pay

## 2011-03-13 DIAGNOSIS — Z79899 Other long term (current) drug therapy: Secondary | ICD-10-CM | POA: Insufficient documentation

## 2011-03-13 DIAGNOSIS — C50919 Malignant neoplasm of unspecified site of unspecified female breast: Secondary | ICD-10-CM | POA: Insufficient documentation

## 2011-03-13 DIAGNOSIS — IMO0002 Reserved for concepts with insufficient information to code with codable children: Secondary | ICD-10-CM | POA: Insufficient documentation

## 2011-03-13 DIAGNOSIS — Y838 Other surgical procedures as the cause of abnormal reaction of the patient, or of later complication, without mention of misadventure at the time of the procedure: Secondary | ICD-10-CM | POA: Insufficient documentation

## 2011-03-13 NOTE — Consults (Signed)
Reason for Consult: Bleeding from recent [facility] site Referring Physician: Hyman Hopes Consulting Surgeon: Dr. Abbey Chatters.  HPI: [name] is an 65 y.o. female. She has a portacath replaced last week Friday by Dr. Luisa Hart. She did very well until 2 days ago when she noticed some slow but persistent bloody oozing coming from her incision. She called her Oncologist who tried to get her in with out office, but she ended up here at the ER for eval. She denies weakness, dizziness. She denies trauma to the area other than an 'unexpected hug' that did occur before the bleeding ensued. She has no other complaints.  Past Medical History  Diagnosis Date  . Chest pain   . GERD (gastroesophageal reflux disease)   . Allergy   . Anemia   . Diabetes mellitus   . Hypertension   . Hyperlipidemia   . Fatty liver disease, nonalcoholic   . History of hyperkalemia   . Obesity   . Renal lesion   . Sleep disturbances   . Hemorrhoids   . Otitis media   . Abdominal pain, RUQ   . OA (osteoarthritis) of knee     Bilateral  . Breast ca     (Rt) breast ca dx 4/12    Past Surgical History  Procedure Date  . Abdominal hysterectomy   . Tubal ligation   . Knee arthroscopy 2000    Right  . Breast surgery     right- mastectomy    Family History  Problem Relation Age of Onset  . Stroke Father   . Heart disease Sister   . Cancer Sister     breast  . Heart disease Brother     Social History:  reports that she has never smoked. She does not have any smokeless tobacco history on file. She reports that she does not drink alcohol or use illicit drugs.  Allergies:  Allergies  Allergen Reactions  . Metformin Other (See Comments)    REACTION, diarrhea: 850 mg - diarrhea but tolerates 500 mg daily    Medications: I have reviewed the patient's current medications. She did stop her ASA yesterday.  ROS: See HPI  Physical Exam: Blood pressure 155/85, pulse 89, temperature 98.5 F (36.9 C), temperature  source Oral, resp. rate 18, weight 160 lb (72.576 kg), SpO2 98.00%.  PE: Limited to port site, small amt of swelling but soft, probable small hematoma. No tenderness. The incision line is well closed however with pressure, I am able to express some deep purple blood from the incision line. No continuous flow is see or bright red blood. I estimate about 2cc of old blood was expressed, no further bleeding seen. Sterile dressing placed over site. Labs: None drawn    No results found.  Assessment/Plan: Small hematoma at [facility] incision site with spontaneous drainage/evac. No active bleed. OK for DC from ED, f/u as scheduled with Dr. Luisa Hart next week.  Marianna Fuss 03/13/2011, 11:40 AM

## 2011-03-13 NOTE — ED Provider Notes (Signed)
History     CSN: [id number] Arrival date & time: 03/13/2011  9:48 AM   First MD Initiated Contact with Patient 03/13/11 1048      Chief Complaint  Patient presents with  . Vascular Access Problem    (Consider location/radiation/quality/duration/timing/severity/associated sxs/prior treatment) HPI  65yoF h/o breast cancer s/p Rt mastectomy currently undergoing chemotherapy treatment via left subclavian catheter presents with complaint of oozing from the wound. The catheter was replaced approximately 6 days ago in the operating room. The patient noted minimal bleeding from the site after the operation. For the past 2 days she's had increased bleeding noted. She is changing her wound dressing approximately 2-3 times per day. She denies any pain from the site. She denies fever, chills. She denies chest pain, shortness of breath. Her catheter has never been accessed.  Review of systems negative for weakness, dizziness, chest pain, shortness of breath. She takes ASA 81mg  daily, denies anticoagulant use  Bubba Camp, RN 03/13/2011 11:01    Came to ER [facility] to the [facility] due to bleeding at the site of the [facility]. Bandage that was present at her time of arrival had a moderated amount of dried blood. Bandage removed by Dr. Hyman Hopes and discovered a scant amount of bleeding at the site. Bleeding was easily controlled using gauze. Pt tolerated well.         Dalbert Batman Reagan, RN 03/13/2011 10:36      Pt states "had a porta cath put in last Friday, was in the [facility] on Tuesday for a little while and it started bleeding after I left here"      Past Medical History  Diagnosis Date  . Chest pain   . GERD (gastroesophageal reflux disease)   . Allergy   . Anemia   . Diabetes mellitus   . Hypertension   . Hyperlipidemia   . Fatty liver disease, nonalcoholic   . History of hyperkalemia   . Obesity   . Renal lesion   . Sleep disturbances   . Hemorrhoids   . Otitis  media   . Abdominal pain, RUQ   . OA (osteoarthritis) of knee     Bilateral  . Breast ca     (Rt) breast ca dx 4/12    Past Surgical History  Procedure Date  . Abdominal hysterectomy   . Tubal ligation   . Knee arthroscopy 2000    Right  . Breast surgery     right- mastectomy    Family History  Problem Relation Age of Onset  . Stroke Father   . Heart disease Sister   . Cancer Sister     breast  . Heart disease Brother     History  Substance Use Topics  . Smoking status: Never Smoker   . Smokeless tobacco: Not on file  . Alcohol Use: No    OB History    Grav Para Term Preterm Abortions TAB SAB Ect Mult Living                  Review of Systems  All other systems reviewed and are negative.   except as noted HPI  Allergies  Metformin  Home Medications   Current Outpatient Rx  Name Route Sig Dispense Refill  . ASPIRIN 81 MG PO TABS Oral Take 1 tablet (81 mg total) by mouth daily. Two tablets once daily 1 tablet 5  . FLUTICASONE PROPIONATE 50 MCG/ACT NA SUSP Nasal Place 2 sprays into the nose  daily. 16 g 2  . GLIPIZIDE 10 MG PO TABS Oral Take 1 tablet (10 mg total) by mouth 2 (two) times daily before a meal. 90 tablet 3  . HYDROCHLOROTHIAZIDE 25 MG PO TABS Oral Take 25 mg by mouth daily.      Marland Kitchen LISINOPRIL 40 MG PO TABS Oral Take 1 tablet (40 mg total) by mouth daily. 30 tablet 11  . METFORMIN HCL 500 MG PO TABS Oral Take 1 tablet (500 mg total) by mouth 2 (two) times daily with a meal. 90 tablet 3  . PRAVASTATIN SODIUM 40 MG PO TABS Oral Take 1 tablet (40 mg total) by mouth at bedtime. 90 tablet 5    BP 155/85  Pulse 89  Temp(Src) 98.5 F (36.9 C) (Oral)  Resp 18  Wt 160 lb (72.576 kg)  SpO2 98%  Physical Exam  Nursing note and vitals reviewed. Constitutional: [name], place, and time. She appears well-developed.  HENT:  Head: Atraumatic.  Mouth/Throat: Oropharynx is clear and moist.  Eyes: Conjunctivae and EOM are normal.  Pupils are equal, round, and reactive to light.  Neck: Normal range of motion. Neck supple.  Cardiovascular: Normal rate, regular rhythm, normal heart sounds and intact distal pulses.   Pulmonary/Chest: Effort normal and breath sounds normal. No respiratory distress. She has no wheezes. She has no rales.       Left upper chest with palpable subclavian Port-A-Cath. There is a 4 cm  healing incision with small area of bleeding with gentle pressure only. There is no surrounding hematoma. There is no erythema or overlying tenderness to palpation   Abdominal: Soft. She exhibits no distension. There is no tenderness. There is no rebound and no guarding.  Musculoskeletal: Normal range of motion.  Neurological: She is alert and oriented to person, place, and time.  Skin: Skin is warm and dry. No rash noted.  Psychiatric: She has a normal mood and affect.    ED Course  Procedures (including critical care time)  Labs Reviewed - No data to display No results found.   No diagnosis found.    MDM  S/P Left subclavian port-a-cath replacement with bleeding from wound. Appears superficial. Minimal. Will discuss with surgery.  Stefano Gaul, MD   Patient seen by surgery. Agree likely small subcut hematoma. Pt advised [id number] changes. Small kink noted in catheter on XR. Has f/u with surgery. Advised to keep f/u appt. Precautions for return.      Forbes Cellar, MD 03/13/11 2048

## 2011-03-13 NOTE — ED Notes (Signed)
Surgery has seen patient.

## 2011-03-13 NOTE — ED Notes (Signed)
Came to ER after going to the Cancer Center due to bleeding at the site of the [facility]. Bandage that was present at her time of arrival had a moderated amount of dried blood. Bandage removed by Dr. Hyman Hopes and discovered a scant amount of bleeding at the site. Bleeding was easily controlled using gauze. Pt tolerated well.

## 2011-03-13 NOTE — ED Notes (Signed)
Bleeding controlled and will f/u with surgeon. No complaints at present

## 2011-03-13 NOTE — ED Notes (Signed)
Pt states "had a porta cath put in last Friday, was in the [facility] on Tuesday for a little while and it started bleeding after I left here"

## 2011-03-18 ENCOUNTER — Other Ambulatory Visit (HOSPITAL_BASED_OUTPATIENT_CLINIC_OR_DEPARTMENT_OTHER): Payer: Self-pay | Admitting: Lab

## 2011-03-18 ENCOUNTER — Other Ambulatory Visit: Payer: Self-pay | Admitting: Oncology

## 2011-03-18 ENCOUNTER — Ambulatory Visit (HOSPITAL_BASED_OUTPATIENT_CLINIC_OR_DEPARTMENT_OTHER): Payer: Self-pay

## 2011-03-18 ENCOUNTER — Telehealth: Payer: Self-pay | Admitting: *Deleted

## 2011-03-18 ENCOUNTER — Other Ambulatory Visit: Payer: Self-pay | Admitting: Physician Assistant

## 2011-03-18 ENCOUNTER — Ambulatory Visit (HOSPITAL_BASED_OUTPATIENT_CLINIC_OR_DEPARTMENT_OTHER): Payer: Self-pay | Admitting: Physician Assistant

## 2011-03-18 VITALS — BP 161/80 | HR 88 | Temp 98.4°F | Ht 58.5 in | Wt 159.5 lb

## 2011-03-18 DIAGNOSIS — Z5111 Encounter for antineoplastic chemotherapy: Secondary | ICD-10-CM

## 2011-03-18 DIAGNOSIS — C50919 Malignant neoplasm of unspecified site of unspecified female breast: Secondary | ICD-10-CM

## 2011-03-18 DIAGNOSIS — C50319 Malignant neoplasm of lower-inner quadrant of unspecified female breast: Secondary | ICD-10-CM

## 2011-03-18 DIAGNOSIS — C773 Secondary and unspecified malignant neoplasm of axilla and upper limb lymph nodes: Secondary | ICD-10-CM

## 2011-03-18 DIAGNOSIS — I972 Postmastectomy lymphedema syndrome: Secondary | ICD-10-CM

## 2011-03-18 DIAGNOSIS — T451X5A Adverse effect of antineoplastic and immunosuppressive drugs, initial encounter: Secondary | ICD-10-CM

## 2011-03-18 DIAGNOSIS — D6481 Anemia due to antineoplastic chemotherapy: Secondary | ICD-10-CM

## 2011-03-18 LAB — COMPREHENSIVE METABOLIC PANEL
ALT: 15 U/L (ref 0–35)
AST: 13 U/L (ref 0–37)
Albumin: 4.2 g/dL (ref 3.5–5.2)
Alkaline Phosphatase: 61 U/L (ref 39–117)
BUN: 11 mg/dL (ref 6–23)
CO2: 30 mEq/L (ref 19–32)
Calcium: 9.8 mg/dL (ref 8.4–10.5)
Chloride: 101 mEq/L (ref 96–112)
Creatinine, Ser: 0.75 mg/dL (ref 0.50–1.10)
Glucose, Bld: 123 mg/dL — ABNORMAL HIGH (ref 70–99)
Potassium: 3.6 mEq/L (ref 3.5–5.3)
Sodium: 139 mEq/L (ref 135–145)
Total Bilirubin: 0.2 mg/dL — ABNORMAL LOW (ref 0.3–1.2)
Total Protein: 6.7 g/dL (ref 6.0–8.3)

## 2011-03-18 LAB — CBC WITH DIFFERENTIAL/PLATELET
BASO%: 1.4 % (ref 0.0–2.0)
Basophils Absolute: 0.1 10*3/uL (ref 0.0–0.1)
EOS%: 1.8 % (ref 0.0–7.0)
Eosinophils Absolute: 0.1 10*3/uL (ref 0.0–0.5)
HCT: 29.7 % — ABNORMAL LOW (ref 34.8–46.6)
HGB: 9.8 g/dL — ABNORMAL LOW (ref 11.6–15.9)
LYMPH%: 30 % (ref 14.0–49.7)
MCH: 28.9 pg (ref 25.1–34.0)
MCHC: 33 g/dL (ref 31.5–36.0)
MCV: 87.6 fL (ref 79.5–101.0)
MONO#: 0.4 10*3/uL (ref 0.1–0.9)
MONO%: 8.5 % (ref 0.0–14.0)
NEUT#: 2.9 10*3/uL (ref 1.5–6.5)
NEUT%: 58.3 % (ref 38.4–76.8)
Platelets: 328 10*3/uL (ref 145–400)
RBC: 3.39 10*6/uL — ABNORMAL LOW (ref 3.70–5.45)
RDW: 20.7 % — ABNORMAL HIGH (ref 11.2–14.5)
WBC: 5 10*3/uL (ref 3.9–10.3)
lymph#: 1.5 10*3/uL (ref 0.9–3.3)
nRBC: 0 % (ref 0–0)

## 2011-03-18 LAB — WHOLE BLOOD GLUCOSE
Glucose: 154 mg/dL — ABNORMAL HIGH (ref 70–100)
HRS PC: 0.5 Hours

## 2011-03-18 MED ORDER — SODIUM CHLORIDE 0.9 % IV SOLN
600.0000 mg/m2 | Freq: Once | INTRAVENOUS | Status: AC
  Administered 2011-03-18: 1040 mg via INTRAVENOUS
  Filled 2011-03-18: qty 52

## 2011-03-18 MED ORDER — DEXAMETHASONE SODIUM PHOSPHATE 4 MG/ML IJ SOLN
12.0000 mg | Freq: Once | INTRAMUSCULAR | Status: AC
  Administered 2011-03-18: 20 mg via INTRAVENOUS

## 2011-03-18 MED ORDER — SODIUM CHLORIDE 0.9 % IV SOLN
Freq: Once | INTRAVENOUS | Status: AC
  Administered 2011-03-18: 13:00:00 via INTRAVENOUS

## 2011-03-18 MED ORDER — PALONOSETRON HCL INJECTION 0.25 MG/5ML
0.2500 mg | Freq: Once | INTRAVENOUS | Status: AC
  Administered 2011-03-18: 0.25 mg via INTRAVENOUS

## 2011-03-18 MED ORDER — SODIUM CHLORIDE 0.9 % IV SOLN
150.0000 mg | Freq: Once | INTRAVENOUS | Status: AC
  Administered 2011-03-18: 150 mg via INTRAVENOUS
  Filled 2011-03-18: qty 5

## 2011-03-18 MED ORDER — DOXORUBICIN HCL CHEMO IV INJECTION 2 MG/ML
60.0000 mg/m2 | INTRAVENOUS | Status: DC
  Administered 2011-03-18: 104 mg via INTRAVENOUS
  Filled 2011-03-18: qty 52

## 2011-03-18 NOTE — Progress Notes (Addendum)
Samyiah Halvorsen [id number] May 27, 1945 03/18/2011  4:36 PM  This is an addendum to previous office note following the patient's visit on 03/18/2011.  In the past, the patient has received her Adriamycin via continuous pump over 72 hours. She received cycle 4 of 4 planned dose dense cycles today as scheduled. There was a medication error, and rather than getting this by continuous pump over 72 hours, the Adriamycin was actually given by push. There was no documented harm to the patient. She has had no cardiac issues since initiating chemotherapy. I will note that an EKG in May of 2012 was normal, and an echo in June 2012 showed an EF of 55 - 60%.  Of course the patient was aware that she did not get the pump today, and was actually very pleased that she did not have to take the pump home for the next 3 days. She'll return tomorrow, day 2, for her Neulasta injection as is the normal protocol. This has, of course, been reviewed with Dr. Darnelle Catalan as well, and he voices his agreement, and does not anticipate that this will be of any consequence to the patient.

## 2011-03-18 NOTE — Telephone Encounter (Signed)
NA

## 2011-03-18 NOTE — Progress Notes (Signed)
Hematology and Oncology Follow Up Visit  [name] [id number] Mar 02, 1946 65 y.o. 03/18/2011 3:45 PM   Interim History:  The patient returns today for followup of her right breast carcinoma. She is due for day 1 cycle 4 of 4 planned dose dense cycles of doxorubicin and cyclophosphamide. Unfortunately this treatment has been delayed for 2 weeks. They were unable to access the patient's port on October 30, her port was subsequently replaced on November 1, and the patient declined treatment last week due to pain in the area of the port. Recall that she receives her doxorubicin by continuous infusion over 72 hours, and simply did not feel that she could tolerate the pump connected to her painful port last week. She returns today for reassessment, in anticipation of her fourth and final dose of doxorubicin/cyclophosphamide today.  After being evaluated here in the office last week on November 6, the patient reported to the emergency department at [facility] with complaints of bleeding in the area of the port insertion. This was evaluated by the emergency department staff, as well as a general surgeon, and was found to be "old blood". This was a probable small hematoma. They were able to express some blood from the incision line, but there was no bright red blood noted. A sterile dressing was placed over the site, and the patient was evaluated by Dr. Luisa Hart earlier this week. At this point, the port insertion site has healed nicely, and the patient has had no additional bleeding and no significant pain. She is ready to proceed with treatment today accordingly.  Otherwise, physically the patient is doing well. She's slightly tired, but tells me that last couple of weeks have given her a time thoracic and recover. She continues to have some chemotherapy-induced anemia with a hemoglobin of 9.8 today. Again she denies any signs of abnormal bleeding at this point. She has occasional nausea which is  not new, but has had no emesis. She is having regular bowel movements. She continues to have some dryness of the eyes, and is utilizing eyedrops as prescribed per her ophthalmologist. Otherwise a detailed review of systems is noncontributory as noted below.  Review of Systems: Constitutional:  no weight loss, fever, night sweats and feels well Eyes: dry eyes GEX:[id number] Cardiovascular: no chest pain or dyspnea on exertion Respiratory: no cough, shortness of breath, or wheezing Neurological: negative Dermatological: negative Gastrointestinal: no abdominal pain, change in bowel habits, or black or bloody stools Genito-Urinary: no dysuria, trouble voiding, or hematuria Hematological and Lymphatic: negative Breast: negative Musculoskeletal: negative Remaining ROS negative.  Medications: I have reviewed the patient's current medications.  Allergies:  Allergies  Allergen Reactions  . Metformin Other (See Comments)    REACTION, diarrhea: 850 mg - diarrhea but tolerates 500 mg daily     Physical Exam:  Blood pressure 161/80, pulse 88, temperature 98.4 F (36.9 C), height 4' 10.5" (1.486 m), weight 159 lb 8 oz (72.349 kg). HEENT:  Sclerae anicteric, conjunctivae pink with mild erythema due to dry eyes.  Oropharynx clear.  No mucositis or candidiasis.  Nodes:  No cervical, supraclavicular, or axillary lymphadenopathy palpated.  Breast Exam: Deferred.  Lungs:  Clear to auscultation bilaterally.  No crackles, rhonchi, or wheezes.  Heart:  Regular rate and rhythm.  Abdomen:  Soft, nontender.  Positive bowel sounds.  No organomegaly or masses palpated.  Musculoskeletal:  No focal spinal tenderness to palpation.  Extremities:  Benign.  No peripheral edema or cyanosis.  Skin:  Benign.  Port is intact in the upper left chest wall with well-healed incision.  No bleeding, drainage, erythema, or edema noted on exam. Neuro:  Nonfocal.   Lab Results: Lab Results  Component Value Date   WBC 6.9  03/06/2011   HGB 9.8* 03/18/2011   HCT 29.7* 03/18/2011   MCV 87.6 03/18/2011   PLT 328 03/18/2011   NEUTROABS 2.9 11/22/2010     Chemistry      Component Value Date/Time   NA 139 03/18/2011 1117   K 3.6 03/18/2011 1117   CL 101 03/18/2011 1117   CO2 30 03/18/2011 1117   BUN 11 03/18/2011 1117   CREATININE 0.75 03/18/2011 1117   GLU 154* 03/18/2011 1116      Component Value Date/Time   CALCIUM 9.8 03/18/2011 1117   ALKPHOS 61 03/18/2011 1117   AST 13 03/18/2011 1117   ALT 15 03/18/2011 1117   BILITOT 0.2* 03/18/2011 1117          Impression and Plan: 65 year old [place] woman BRCA2 positive, status post right mastectomy and axillary lymph node dissection in August 2012, for a T1CN1, grade 3, invasive ductal carcinoma. ER and PR positive, HER-2/neu negative, with MIB-1 of 62%. Currently being treated in the adjuvant setting. Cycle 4 of doxorubicin/cyclophosphamide was held two wseek ago due to problems with the port which has been revised. Currently due for day 1 cycle 4 of 4 planned dose dense cycles of doxorubicin/cyclophosphamide today. Doxorubicin is given by continuous infusion over 72 hours, with Neulasta given on day 5 for granulocyte support. The plan is to complete 4 doses of AC to be followed by 12 weekly doses of paclitaxel.    the patient will proceed to treatment today as scheduled for her fourth and final dose of doxorubicin and cyclophosphamide. She scheduled to return on Friday, November 16 to have her pump discontinued, then on Saturday, November 17, for her Neulasta injection. She scheduled for labs only next week on November 20, and I will see her in 2 weeks on November 27 for repeat labs and followup visit, in anticipation of her first weekly dose of paclitaxel. We did review briefly, her antinausea regimen for paclitaxel. She does voice understanding and agreement with our plan, no difficulty changes or problems.   Spent more than half the time coordinating  care.    Meira Wahba, PA-C 11/13/20123:45 PM

## 2011-03-19 ENCOUNTER — Other Ambulatory Visit: Payer: Self-pay | Admitting: *Deleted

## 2011-03-19 ENCOUNTER — Ambulatory Visit (INDEPENDENT_AMBULATORY_CARE_PROVIDER_SITE_OTHER): Payer: Self-pay | Admitting: Surgery

## 2011-03-19 ENCOUNTER — Encounter (INDEPENDENT_AMBULATORY_CARE_PROVIDER_SITE_OTHER): Payer: Self-pay | Admitting: Surgery

## 2011-03-19 ENCOUNTER — Ambulatory Visit (HOSPITAL_BASED_OUTPATIENT_CLINIC_OR_DEPARTMENT_OTHER): Payer: Self-pay

## 2011-03-19 VITALS — BP 154/94 | HR 88 | Temp 98.8°F | Resp 16 | Ht 59.0 in | Wt 162.4 lb

## 2011-03-19 VITALS — BP 154/81 | HR 94 | Temp 99.0°F

## 2011-03-19 DIAGNOSIS — Z853 Personal history of malignant neoplasm of breast: Secondary | ICD-10-CM

## 2011-03-19 DIAGNOSIS — C773 Secondary and unspecified malignant neoplasm of axilla and upper limb lymph nodes: Secondary | ICD-10-CM

## 2011-03-19 DIAGNOSIS — C50919 Malignant neoplasm of unspecified site of unspecified female breast: Secondary | ICD-10-CM

## 2011-03-19 DIAGNOSIS — Z5189 Encounter for other specified aftercare: Secondary | ICD-10-CM

## 2011-03-19 DIAGNOSIS — C50319 Malignant neoplasm of lower-inner quadrant of unspecified female breast: Secondary | ICD-10-CM

## 2011-03-19 DIAGNOSIS — E113291 Type 2 diabetes mellitus with mild nonproliferative diabetic retinopathy without macular edema, right eye: Secondary | ICD-10-CM

## 2011-03-19 HISTORY — DX: Type 2 diabetes mellitus with mild nonproliferative diabetic retinopathy without macular edema, right eye: E11.3291

## 2011-03-19 MED ORDER — PEGFILGRASTIM INJECTION 6 MG/0.6ML
6.0000 mg | Freq: Once | SUBCUTANEOUS | Status: AC
  Administered 2011-03-19: 6 mg via SUBCUTANEOUS
  Filled 2011-03-19: qty 0.6

## 2011-03-19 MED ORDER — PEGFILGRASTIM INJECTION 6 MG/0.6ML
6.0000 mg | Freq: Once | SUBCUTANEOUS | Status: DC
  Filled 2011-03-19: qty 0.6

## 2011-03-19 NOTE — Progress Notes (Signed)
Subjective:     Patient ID: [name], female   DOB: 06-Sep-1945, 65 y.o.   MRN: [id number]  HPI The patient returns today for 3 month followup after right modified radical mastectomy for stage II breast cancer and Port-A-Cath placement. She has some bleeding from the Port-A-Cath site last week which is resolved. She got her first chemotherapy treatment last week  without complication  Review of Systems  Constitutional: Negative for fever, chills and unexpected weight change.  HENT: Negative for hearing loss, congestion, sore throat, trouble swallowing and voice change.   Eyes: Negative for visual disturbance.  Respiratory: Negative for cough and wheezing.   Cardiovascular: Negative for chest pain, palpitations and leg swelling.  Gastrointestinal: Negative for nausea, vomiting, abdominal pain, diarrhea, constipation, blood in stool, abdominal distention and anal bleeding.  Genitourinary: Negative for hematuria, vaginal bleeding and difficulty urinating.  Musculoskeletal: Negative for arthralgias.  Skin: Negative for rash and wound.  Neurological: Negative for seizures, syncope and headaches.  Hematological: Negative for adenopathy. Does not bruise/bleed easily.  Psychiatric/Behavioral: Negative for confusion.       Objective:   Physical Exam  Constitutional: She appears well-developed and well-nourished.  HENT:  Head: Normocephalic and atraumatic.  Eyes: Pupils are equal, round, and reactive to light.  Neck: Normal range of motion. Neck supple.  Pulmonary/Chest:       Right mastectomy wound clean dry and intact without signs of infection or seroma Left sided Port-A-Cath site clean dry and intact without signs of infection or bleeding  Lymphadenopathy:    She has no cervical adenopathy.       Assessment:     History of breast cancer stage 2 S/P right modified radical mastectomy.    Plan:     Complete chemotherapy  Return in 3 months.

## 2011-03-19 NOTE — Patient Instructions (Signed)
Follow up in three months.   

## 2011-03-21 ENCOUNTER — Telehealth: Payer: Self-pay | Admitting: *Deleted

## 2011-03-21 ENCOUNTER — Other Ambulatory Visit: Payer: Self-pay | Admitting: Physician Assistant

## 2011-03-21 NOTE — Telephone Encounter (Signed)
CALLED PATIENT LEFT MESSAGE TO INFORM THE PATIENT OF THE NEW TIME BUT THE SAME DAY

## 2011-03-25 ENCOUNTER — Ambulatory Visit: Payer: Self-pay

## 2011-03-25 ENCOUNTER — Other Ambulatory Visit: Payer: Self-pay | Admitting: Oncology

## 2011-03-25 ENCOUNTER — Other Ambulatory Visit (HOSPITAL_BASED_OUTPATIENT_CLINIC_OR_DEPARTMENT_OTHER): Payer: Self-pay | Admitting: Lab

## 2011-03-25 DIAGNOSIS — Z5111 Encounter for antineoplastic chemotherapy: Secondary | ICD-10-CM

## 2011-03-25 DIAGNOSIS — C50319 Malignant neoplasm of lower-inner quadrant of unspecified female breast: Secondary | ICD-10-CM

## 2011-03-25 DIAGNOSIS — I972 Postmastectomy lymphedema syndrome: Secondary | ICD-10-CM

## 2011-03-25 DIAGNOSIS — C50919 Malignant neoplasm of unspecified site of unspecified female breast: Secondary | ICD-10-CM

## 2011-03-25 DIAGNOSIS — C773 Secondary and unspecified malignant neoplasm of axilla and upper limb lymph nodes: Secondary | ICD-10-CM

## 2011-03-25 LAB — CBC WITH DIFFERENTIAL/PLATELET
BASO%: 1.9 % (ref 0.0–2.0)
Basophils Absolute: 0 10*3/uL (ref 0.0–0.1)
EOS%: 6.6 % (ref 0.0–7.0)
Eosinophils Absolute: 0.1 10*3/uL (ref 0.0–0.5)
HCT: 27.5 % — ABNORMAL LOW (ref 34.8–46.6)
HGB: 9.2 g/dL — ABNORMAL LOW (ref 11.6–15.9)
LYMPH%: 36.2 % (ref 14.0–49.7)
MCH: 29.9 pg (ref 25.1–34.0)
MCHC: 33.3 g/dL (ref 31.5–36.0)
MCV: 89.8 fL (ref 79.5–101.0)
MONO#: 0.1 10*3/uL (ref 0.1–0.9)
MONO%: 2.8 % (ref 0.0–14.0)
NEUT#: 1.1 10*3/uL — ABNORMAL LOW (ref 1.5–6.5)
NEUT%: 52.5 % (ref 38.4–76.8)
Platelets: 111 10*3/uL — ABNORMAL LOW (ref 145–400)
RBC: 3.06 10*6/uL — ABNORMAL LOW (ref 3.70–5.45)
RDW: 22.1 % — ABNORMAL HIGH (ref 11.2–14.5)
WBC: 2.1 10*3/uL — ABNORMAL LOW (ref 3.9–10.3)
lymph#: 0.8 10*3/uL — ABNORMAL LOW (ref 0.9–3.3)

## 2011-03-25 LAB — WHOLE BLOOD GLUCOSE: Glucose: 132 mg/dL — ABNORMAL HIGH (ref 70–100)

## 2011-03-27 NOTE — Consults (Signed)
Discussed the patient with surgery consultant.

## 2011-04-01 ENCOUNTER — Ambulatory Visit (HOSPITAL_BASED_OUTPATIENT_CLINIC_OR_DEPARTMENT_OTHER): Payer: Self-pay | Admitting: Physician Assistant

## 2011-04-01 ENCOUNTER — Telehealth: Payer: Self-pay | Admitting: *Deleted

## 2011-04-01 ENCOUNTER — Other Ambulatory Visit: Payer: Self-pay | Admitting: Oncology

## 2011-04-01 ENCOUNTER — Ambulatory Visit (HOSPITAL_BASED_OUTPATIENT_CLINIC_OR_DEPARTMENT_OTHER): Payer: Self-pay

## 2011-04-01 ENCOUNTER — Other Ambulatory Visit: Payer: Self-pay | Admitting: Certified Registered Nurse Anesthetist

## 2011-04-01 ENCOUNTER — Other Ambulatory Visit (HOSPITAL_BASED_OUTPATIENT_CLINIC_OR_DEPARTMENT_OTHER): Payer: Self-pay | Admitting: Lab

## 2011-04-01 ENCOUNTER — Encounter: Payer: Self-pay | Admitting: Physician Assistant

## 2011-04-01 ENCOUNTER — Other Ambulatory Visit: Payer: Self-pay | Admitting: Physician Assistant

## 2011-04-01 VITALS — BP 146/82 | HR 81 | Temp 98.1°F

## 2011-04-01 VITALS — BP 154/85 | HR 85 | Temp 97.7°F | Ht 59.0 in | Wt 157.3 lb

## 2011-04-01 DIAGNOSIS — C50319 Malignant neoplasm of lower-inner quadrant of unspecified female breast: Secondary | ICD-10-CM

## 2011-04-01 DIAGNOSIS — C50919 Malignant neoplasm of unspecified site of unspecified female breast: Secondary | ICD-10-CM

## 2011-04-01 DIAGNOSIS — Z170 Estrogen receptor positive status [ER+]: Secondary | ICD-10-CM

## 2011-04-01 DIAGNOSIS — Z5111 Encounter for antineoplastic chemotherapy: Secondary | ICD-10-CM

## 2011-04-01 DIAGNOSIS — E119 Type 2 diabetes mellitus without complications: Secondary | ICD-10-CM

## 2011-04-01 DIAGNOSIS — C773 Secondary and unspecified malignant neoplasm of axilla and upper limb lymph nodes: Secondary | ICD-10-CM

## 2011-04-01 LAB — CBC WITH DIFFERENTIAL/PLATELET
BASO%: 0.5 % (ref 0.0–2.0)
Basophils Absolute: 0 10*3/uL (ref 0.0–0.1)
EOS%: 0.5 % (ref 0.0–7.0)
Eosinophils Absolute: 0 10*3/uL (ref 0.0–0.5)
HCT: 32.3 % — ABNORMAL LOW (ref 34.8–46.6)
HGB: 10.6 g/dL — ABNORMAL LOW (ref 11.6–15.9)
LYMPH%: 15.9 % (ref 14.0–49.7)
MCH: 29.6 pg (ref 25.1–34.0)
MCHC: 33 g/dL (ref 31.5–36.0)
MCV: 89.7 fL (ref 79.5–101.0)
MONO#: 0.5 10*3/uL (ref 0.1–0.9)
MONO%: 5.9 % (ref 0.0–14.0)
NEUT#: 7.1 10*3/uL — ABNORMAL HIGH (ref 1.5–6.5)
NEUT%: 77.2 % — ABNORMAL HIGH (ref 38.4–76.8)
Platelets: 154 10*3/uL (ref 145–400)
RBC: 3.6 10*6/uL — ABNORMAL LOW (ref 3.70–5.45)
RDW: 23 % — ABNORMAL HIGH (ref 11.2–14.5)
WBC: 9.2 10*3/uL (ref 3.9–10.3)
lymph#: 1.5 10*3/uL (ref 0.9–3.3)

## 2011-04-01 LAB — COMPREHENSIVE METABOLIC PANEL
ALT: 14 U/L (ref 0–35)
AST: 13 U/L (ref 0–37)
Albumin: 4.6 g/dL (ref 3.5–5.2)
Alkaline Phosphatase: 89 U/L (ref 39–117)
BUN: 12 mg/dL (ref 6–23)
CO2: 30 mEq/L (ref 19–32)
Calcium: 10 mg/dL (ref 8.4–10.5)
Chloride: 99 mEq/L (ref 96–112)
Creatinine, Ser: 0.72 mg/dL (ref 0.50–1.10)
Glucose, Bld: 140 mg/dL — ABNORMAL HIGH (ref 70–99)
Potassium: 3.2 mEq/L — ABNORMAL LOW (ref 3.5–5.3)
Sodium: 139 mEq/L (ref 135–145)
Total Bilirubin: 0.1 mg/dL — ABNORMAL LOW (ref 0.3–1.2)
Total Protein: 7.4 g/dL (ref 6.0–8.3)

## 2011-04-01 MED ORDER — SODIUM CHLORIDE 0.9 % IV SOLN: Freq: Once | INTRAVENOUS | Status: DC

## 2011-04-01 MED ORDER — PACLITAXEL CHEMO INJECTION 300 MG/50ML
80.0000 mg/m2 | Freq: Once | INTRAVENOUS | Status: AC
  Administered 2011-04-01: 138 mg via INTRAVENOUS
  Filled 2011-04-01: qty 23

## 2011-04-01 MED ORDER — HEPARIN SOD (PORK) LOCK FLUSH 100 UNIT/ML IV SOLN
500.0000 [IU] | Freq: Once | INTRAVENOUS | Status: AC | PRN
  Administered 2011-04-01: 500 [IU]
  Filled 2011-04-01: qty 5

## 2011-04-01 MED ORDER — ONDANSETRON 8 MG/50ML IVPB (CHCC)
8.0000 mg | Freq: Once | INTRAVENOUS | Status: AC
Start: 2011-04-01 — End: 2011-04-01
  Administered 2011-04-01: 8 mg via INTRAVENOUS

## 2011-04-01 MED ORDER — SODIUM CHLORIDE 0.9 % IJ SOLN
10.0000 mL | INTRAMUSCULAR | Status: DC | PRN
  Administered 2011-04-01: 10 mL
  Filled 2011-04-01: qty 10

## 2011-04-01 MED ORDER — DIPHENHYDRAMINE HCL 50 MG/ML IJ SOLN
50.0000 mg | Freq: Once | INTRAMUSCULAR | Status: AC
  Administered 2011-04-01: 50 mg via INTRAVENOUS

## 2011-04-01 MED ORDER — FAMOTIDINE IN NACL 20-0.9 MG/50ML-% IV SOLN
20.0000 mg | Freq: Once | INTRAVENOUS | Status: AC
  Administered 2011-04-01: 20 mg via INTRAVENOUS

## 2011-04-01 MED ORDER — DEXAMETHASONE SODIUM PHOSPHATE 4 MG/ML IJ SOLN
20.0000 mg | Freq: Once | INTRAMUSCULAR | Status: AC
  Administered 2011-04-01: 20 mg via INTRAVENOUS

## 2011-04-01 MED ORDER — TOBRAMYCIN-DEXAMETHASONE 0.3-0.1 % OP SUSP: OPHTHALMIC | Status: DC

## 2011-04-01 NOTE — Telephone Encounter (Signed)
gave patient appointment for 04-2011 and 05-2011. Printed out calendar and gave to the patient

## 2011-04-01 NOTE — Progress Notes (Signed)
TAXOL. At 1255, VSS,denies distress. Taxol started at 63 mls per hour for 16 mls.  Pt instructed  To inform nurse at once if having difficullty breathing,  Chest pain, or sudden chills.

## 2011-04-01 NOTE — Progress Notes (Signed)
At 1305, VSS,denies distress,   infsuing at 63 mls per hour.

## 2011-04-01 NOTE — Progress Notes (Signed)
At 1310, VSS, denies distress,  infsuion increased to flow at  125 mls per hour for 31 mls.

## 2011-04-01 NOTE — Progress Notes (Signed)
At 1325, denies distress. Infusion of TAXOL increased to flow at  188 mls per hour for 47 mls.

## 2011-04-01 NOTE — Progress Notes (Signed)
At 1343, VSS,denies distress. TAXOL infusion increased to flow at  250 mls per hour for 63 mls.

## 2011-04-01 NOTE — Progress Notes (Signed)
Hematology and Oncology Follow Up Visit  [name] [id number] 1946-04-10 65 y.o. 04/01/2011 11:15 AM   Interim History:   Patient returns today for followup of her right breast carcinoma for which she is being treated in the adjuvant setting. She has completed all 4 cycles of dose dense doxorubicin and cyclophosphamide is due to initiate her first of 12 planned weekly doses of paclitaxel today. She tolerated her fourth and final dose of doxorubicin and cyclophosphamide very well 2 weeks ago. This was given by normal IV infusion rather than continuous IV infusion over 72 hours. The patient tolerated treatment extremely well. She denies any problems with chest pain or shortness of breath and had no nausea.  Physically, in fact, the only complaint the patient has today is some increased tearing. She uses artificial tears with some relief. She's been sleeping a little better. She's had no nausea or change in bowel habits. Despite her diabetes, she denies any signs of peripheral neuropathy at baseline. She has had no mouth ulcers or oral sensitivity. No skin changes or nail bed changes other than hyperpigmentation.  A detailed review of systems is otherwise noncontributory as noted below.  Review of Systems: Constitutional:  no weight loss, fever, night sweats and feels well Eyes: negative WUJ:[id number] Cardiovascular: no chest pain or dyspnea on exertion Respiratory: no cough, shortness of breath, or wheezing Neurological: negative Dermatological: positive for hyperpigmentation Gastrointestinal: no abdominal pain, change in bowel habits, or black or bloody stools Genito-Urinary: no dysuria, trouble voiding, or hematuria Hematological and Lymphatic: negative Breast: negative Musculoskeletal: negative Remaining ROS negative.  Medications: I have reviewed the patient's current medications.  Allergies:  Allergies  Allergen Reactions  . Metformin Other (See Comments)    REACTION,  diarrhea: 850 mg - diarrhea but tolerates 500 mg daily     Physical Exam:  Blood pressure 154/85, pulse 85, temperature 97.7 F (36.5 C), temperature source Oral, height 4\' 11"  (1.499 m), weight 157 lb 4.8 oz (71.351 kg). HEENT:  Sclerae anicteric, conjunctivae pink.  Oropharynx clear.  No mucositis or candidiasis.  Nodes:  No cervical, supraclavicular, or axillary lymphadenopathy palpated.  Breast Exam: Deferred.  Lungs:  Clear to auscultation bilaterally.  No crackles, rhonchi, or wheezes.  Heart:  Regular rate and rhythm.  Abdomen:  Soft, nontender.  Positive bowel sounds.  No organomegaly or masses palpated.  Musculoskeletal:  No focal spinal tenderness to palpation.  Extremities:  Benign.  No peripheral edema or cyanosis.  Skin:  Benign with the exception of hyperpigmentation on the hands and nailbeds bilaterally.  Neuro:  Nonfocal.   Lab Results: Lab Results  Component Value Date   WBC 2.1* 03/25/2011   HGB 9.2* 03/25/2011   HCT 27.5* 03/25/2011   MCV 89.8 03/25/2011   PLT 111* 03/25/2011   NEUTROABS 1.1* 03/25/2011     Chemistry      Component Value Date/Time   NA 139 04/01/2011 0916   K 3.2* 04/01/2011 0916   CL 99 04/01/2011 0916   CO2 30 04/01/2011 0916   BUN 12 04/01/2011 0916   CREATININE 0.72 04/01/2011 0916   GLU 132* 03/25/2011 0957      Component Value Date/Time   CALCIUM 10.0 04/01/2011 0916   ALKPHOS 89 04/01/2011 0916   AST 13 04/01/2011 0916   ALT 14 04/01/2011 0916   BILITOT 0.1* 04/01/2011 0916        Impression and Plan: 65 year old [place] woman BRCA2 positive, status post right mastectomy and axillary lymph node dissection  in August 2012, for a T1CN1, grade 3, invasive ductal carcinoma. ER and PR positive, HER-2/neu negative, with MIB-1 of 62%. Currently being treated in the adjuvant setting and status post 4 cycles of dose dense doxorubicin and cyclophosphamide.. Cycle 4 was delayed in late October due to problems with the port which has been  revised. Doxorubicin was given by continuous infusion over 72 hours for the first 3 cycles, by regular IV infusion with cycle 4. Patient tolerated treatment well and is ready to proceed with her first of 12 planned weekly doses of paclitaxel today. She does have a history of diabetes which is followed closely.  The patient will proceed to treatment today as scheduled for her first weekly dose of paclitaxel. Over half of our 40 minute appointment today was spent going over possible side effects associated with the paclitaxel and she was given this information in writing. She'll take prochlorperazine, one tablet at night, and one tablet tomorrow morning to prevent nausea. We'll also follow her very closely for any signs of peripheral neuropathy, especially in light of her history of diabetes. We'll continue to see her on a weekly basis, her next appointment scheduled for her next week with her second dose of paclitaxel.   This plan was reviewed with the patient, who voices understanding and agreement.  She knows to call with any changes or problems.    Daeron Carreno, PA-C 11/27/201211:15 AM

## 2011-04-01 NOTE — Progress Notes (Signed)
At 1300, VSS,denies distress.  Infusing  At 63 mls per hour.

## 2011-04-02 ENCOUNTER — Telehealth: Payer: Self-pay | Admitting: *Deleted

## 2011-04-02 NOTE — Telephone Encounter (Signed)
Reached pt. On cell number.  She had her first dose of taxol yesterday.  She denies any nausea, vomiting, diarrhea, mucositis.  She says her appetite is "ok".  She does not have any questions at present.  Discussed with her to call us [phone] for any questions or problems.  She appreciated the phone call.

## 2011-04-08 ENCOUNTER — Other Ambulatory Visit (HOSPITAL_BASED_OUTPATIENT_CLINIC_OR_DEPARTMENT_OTHER): Payer: Self-pay | Admitting: Lab

## 2011-04-08 ENCOUNTER — Ambulatory Visit (HOSPITAL_BASED_OUTPATIENT_CLINIC_OR_DEPARTMENT_OTHER): Payer: Self-pay

## 2011-04-08 ENCOUNTER — Ambulatory Visit (HOSPITAL_BASED_OUTPATIENT_CLINIC_OR_DEPARTMENT_OTHER): Payer: Self-pay | Admitting: Oncology

## 2011-04-08 ENCOUNTER — Other Ambulatory Visit: Payer: Self-pay | Admitting: Physician Assistant

## 2011-04-08 VITALS — BP 129/82 | HR 92 | Temp 98.3°F | Ht 58.5 in | Wt 157.4 lb

## 2011-04-08 DIAGNOSIS — C773 Secondary and unspecified malignant neoplasm of axilla and upper limb lymph nodes: Secondary | ICD-10-CM

## 2011-04-08 DIAGNOSIS — C50919 Malignant neoplasm of unspecified site of unspecified female breast: Secondary | ICD-10-CM

## 2011-04-08 DIAGNOSIS — C50319 Malignant neoplasm of lower-inner quadrant of unspecified female breast: Secondary | ICD-10-CM

## 2011-04-08 DIAGNOSIS — Z5111 Encounter for antineoplastic chemotherapy: Secondary | ICD-10-CM

## 2011-04-08 LAB — CBC WITH DIFFERENTIAL/PLATELET
BASO%: 1.3 % (ref 0.0–2.0)
Basophils Absolute: 0.1 10*3/uL (ref 0.0–0.1)
EOS%: 1 % (ref 0.0–7.0)
Eosinophils Absolute: 0 10*3/uL (ref 0.0–0.5)
HCT: 30.6 % — ABNORMAL LOW (ref 34.8–46.6)
HGB: 10.1 g/dL — ABNORMAL LOW (ref 11.6–15.9)
LYMPH%: 36.8 % (ref 14.0–49.7)
MCH: 28.9 pg (ref 25.1–34.0)
MCHC: 33 g/dL (ref 31.5–36.0)
MCV: 87.7 fL (ref 79.5–101.0)
MONO#: 0.4 10*3/uL (ref 0.1–0.9)
MONO%: 10.5 % (ref 0.0–14.0)
NEUT#: 2 10*3/uL (ref 1.5–6.5)
NEUT%: 50.4 % (ref 38.4–76.8)
Platelets: 308 10*3/uL (ref 145–400)
RBC: 3.49 10*6/uL — ABNORMAL LOW (ref 3.70–5.45)
RDW: 21.6 % — ABNORMAL HIGH (ref 11.2–14.5)
WBC: 4 10*3/uL (ref 3.9–10.3)
lymph#: 1.5 10*3/uL (ref 0.9–3.3)

## 2011-04-08 LAB — COMPREHENSIVE METABOLIC PANEL
ALT: 16 U/L (ref 0–35)
AST: 13 U/L (ref 0–37)
Albumin: 4.3 g/dL (ref 3.5–5.2)
Alkaline Phosphatase: 63 U/L (ref 39–117)
BUN: 14 mg/dL (ref 6–23)
CO2: 28 mEq/L (ref 19–32)
Calcium: 9.7 mg/dL (ref 8.4–10.5)
Chloride: 99 mEq/L (ref 96–112)
Creatinine, Ser: 0.65 mg/dL (ref 0.50–1.10)
Glucose, Bld: 116 mg/dL — ABNORMAL HIGH (ref 70–99)
Potassium: 3.6 mEq/L (ref 3.5–5.3)
Sodium: 138 mEq/L (ref 135–145)
Total Bilirubin: 0.3 mg/dL (ref 0.3–1.2)
Total Protein: 6.6 g/dL (ref 6.0–8.3)

## 2011-04-08 MED ORDER — DEXTROSE 5 % IV SOLN
80.0000 mg/m2 | Freq: Once | INTRAVENOUS | Status: AC
  Administered 2011-04-08: 138 mg via INTRAVENOUS
  Filled 2011-04-08: qty 23

## 2011-04-08 MED ORDER — HEPARIN SOD (PORK) LOCK FLUSH 100 UNIT/ML IV SOLN
500.0000 [IU] | Freq: Once | INTRAVENOUS | Status: AC | PRN
  Administered 2011-04-08: 500 [IU]
  Filled 2011-04-08: qty 5

## 2011-04-08 MED ORDER — DEXAMETHASONE SODIUM PHOSPHATE 10 MG/ML IJ SOLN
4.0000 mg | Freq: Once | INTRAMUSCULAR | Status: AC
  Administered 2011-04-08: 10 mg via INTRAVENOUS

## 2011-04-08 MED ORDER — DIPHENHYDRAMINE HCL 50 MG/ML IJ SOLN
50.0000 mg | Freq: Once | INTRAMUSCULAR | Status: AC
  Administered 2011-04-08: 50 mg via INTRAVENOUS

## 2011-04-08 MED ORDER — ONDANSETRON 8 MG/50ML IVPB (CHCC)
8.0000 mg | Freq: Once | INTRAVENOUS | Status: AC
  Administered 2011-04-08: 8 mg via INTRAVENOUS

## 2011-04-08 MED ORDER — SODIUM CHLORIDE 0.9 % IV SOLN
Freq: Once | INTRAVENOUS | Status: AC
  Administered 2011-04-08: 10:00:00 via INTRAVENOUS

## 2011-04-08 MED ORDER — SODIUM CHLORIDE 0.9 % IJ SOLN
10.0000 mL | INTRAMUSCULAR | Status: DC | PRN
  Administered 2011-04-08: 10 mL
  Filled 2011-04-08: qty 10

## 2011-04-08 MED ORDER — FAMOTIDINE IN NACL 20-0.9 MG/50ML-% IV SOLN
20.0000 mg | Freq: Once | INTRAVENOUS | Status: AC
  Administered 2011-04-08: 20 mg via INTRAVENOUS

## 2011-04-08 NOTE — Progress Notes (Signed)
ID: [name]   Interval History: [name] returns for followup of her breast cancer. She will receive her second weekly dose of Taxol today. She tolerated the first dose without significant toxicities. She took prochlorperazine twice on the day of treatment only. She had no significant problems with nausea. The sugars were not significantly elevated as far she can tell.  ROS:  A detailed review of systems was otherwise noncontributory. She was minimally tired. She has pain in her right leg and right hip, but this is chronic and she has been told that she needs a right total knee replacement. Even though she has diabetes she denies any peripheral neuropathy symptoms.   Medications: I have reviewed the patient's current medications.  Current Outpatient Prescriptions  Medication Sig Dispense Refill  . aspirin 81 MG tablet Take 81 mg by mouth daily.        . fluticasone (FLONASE) 50 MCG/ACT nasal spray Place 2 sprays into the nose daily.  16 g  2  . glipiZIDE (GLUCOTROL) 10 MG tablet Take 1 tablet (10 mg total) by mouth 2 (two) times daily before a meal.  90 tablet  3  . hydrochlorothiazide 25 MG tablet Take 25 mg by mouth daily.        Marland Kitchen lidocaine-prilocaine (EMLA) cream Apply 1 application topically as needed. Prior to port access       . lisinopril (PRINIVIL,ZESTRIL) 40 MG tablet Take 1 tablet (40 mg total) by mouth daily.  30 tablet  11  . metFORMIN (GLUCOPHAGE) 500 MG tablet Take 1 tablet (500 mg total) by mouth 2 (two) times daily with a meal.  90 tablet  3  . polyvinyl alcohol-povidone (REFRESH) 1.4-0.6 % ophthalmic solution Apply 1 drop to eye as needed. Diabetic eyes/dry eyes       . pravastatin (PRAVACHOL) 40 MG tablet Take 1 tablet (40 mg total) by mouth at bedtime.  90 tablet  5  . prochlorperazine (COMPAZINE) 10 MG tablet Take 10 mg by mouth 4 (four) times daily -  before meals and at bedtime. For nausea       . tobramycin-dexamethasone (TOBRADEX) ophthalmic solution 1 drop in  each eye 1-2 times daily while receiving chemo  5 mL  2  . oxyCODONE-acetaminophen (PERCOCET) 5-325 MG per tablet Take 1 tablet by mouth every 6 (six) hours as needed. For pain        Family History: patient's mother alive age 85; father deceased, cause unknown. The patient has 8 sisters and 9 brothers.  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  Objective:  Filed Vitals:   04/08/11 0913  BP: 129/82  Pulse: 92  Temp: 98.3 F (36.8 C)     Physical Exam:    Sclerae unicteric  Oropharynx clear  No peripheral adenopathy  Lungs clear -- no rales or rhonchi  Heart regular rate and rhythm  Abdomen benign  MSK no focal spinal tenderness, no peripheral edema  Neuro nonfocal  Breast exam: Right breast status post mastectomy, no evidence of local recurrence. Left breast no suspicious findings.  Skin: Some nail hyperpigmentation. Port is intact.  Lab Results:  CMP      Component Value Date/Time   NA 139 04/01/2011 0916   K 3.2* 04/01/2011  0916   CL 99 04/01/2011 0916   CO2 30 04/01/2011 0916   GLUCOSE 140* 04/01/2011 0916   BUN 12 04/01/2011 0916   CREATININE 0.72 04/01/2011 0916   CALCIUM 10.0 04/01/2011 0916   PROT 7.4 04/01/2011 0916   ALBUMIN 4.6 04/01/2011 0916   AST 13 04/01/2011 0916   ALT 14 04/01/2011 0916   ALKPHOS 89 04/01/2011 0916   BILITOT 0.1* 04/01/2011 0916   GFRNONAA >90 03/06/2011 1033   GFRAA >90 03/06/2011 1033    CBC Lab Results  Component Value Date   WBC 9.2 04/01/2011   HGB 10.6* 04/01/2011   HCT 32.3* 04/01/2011   MCV 89.7 04/01/2011   PLT 154 04/01/2011    Studies/Results:  Dg Chest 2 View  03/13/2011  *RADIOLOGY REPORT*  Clinical Data: Evaluate Port-A-Cath.  CHEST -  2 VIEW  Comparison: Chest x-ray 03/07/2011.  Findings: The Port-A-Cath appears stable in position and appearance.  Very slight kinking of the catheter near the inferior margin of the clavicle.  The cardiac silhouette, mediastinal and hilar contours are stable.  Stable surgical changes from a right mastectomy.  No acute pulmonary findings.  IMPRESSION: Stable appearance of the left-sided Port-A-Cath.  Very slight kinking of the catheter near the inferior margin of the clavicle.  Original Report Authenticated By: P. Loralie Champagne, M.D.    Assessment:  65 year old BRCA-2 positive [name] woman, status post right mastectomy and axillary lymph node dissection August of 2012 for a T1c N1 (Stage II) invasive ductal carcinoma, grade 3, estrogen and progesterone receptor positive, HER-2 negative, with an MIB-1-1 of 62%, status post 4 cycles of doxorubicin and cyclophosphamide given in dose dense fashion, currently receiving weekly paclitaxel  Plan: I am dropping the steroid dose in her paclitaxel premeds at to minimize diabetes problems. Otherwise we are continuing with this treatment as before. We will try to get in 12 weekly doses of paclitaxel unless she develops neuropathy or other significant complications.  When the patient completes chemotherapy she will need to have her remaining ovary removed. She will need yearly MRIs in addition to mammography. We'll also have to discuss testing her family members.  [name],[name] 04/08/2011

## 2011-04-10 ENCOUNTER — Telehealth: Payer: Self-pay | Admitting: *Deleted

## 2011-04-10 NOTE — Telephone Encounter (Signed)
Pt called to state onset of tingling and numbness in fingers and toes.  Per conversation Sherrie will monitor above to report prior to next therapy.

## 2011-04-15 ENCOUNTER — Ambulatory Visit (HOSPITAL_BASED_OUTPATIENT_CLINIC_OR_DEPARTMENT_OTHER): Payer: Self-pay

## 2011-04-15 ENCOUNTER — Ambulatory Visit (HOSPITAL_BASED_OUTPATIENT_CLINIC_OR_DEPARTMENT_OTHER): Payer: Self-pay | Admitting: Oncology

## 2011-04-15 ENCOUNTER — Other Ambulatory Visit (HOSPITAL_BASED_OUTPATIENT_CLINIC_OR_DEPARTMENT_OTHER): Payer: Self-pay | Admitting: Lab

## 2011-04-15 VITALS — BP 130/85 | HR 92 | Temp 97.4°F | Ht 58.5 in | Wt 153.6 lb

## 2011-04-15 DIAGNOSIS — C50319 Malignant neoplasm of lower-inner quadrant of unspecified female breast: Secondary | ICD-10-CM

## 2011-04-15 DIAGNOSIS — C50919 Malignant neoplasm of unspecified site of unspecified female breast: Secondary | ICD-10-CM

## 2011-04-15 DIAGNOSIS — Z5111 Encounter for antineoplastic chemotherapy: Secondary | ICD-10-CM

## 2011-04-15 DIAGNOSIS — E119 Type 2 diabetes mellitus without complications: Secondary | ICD-10-CM

## 2011-04-15 DIAGNOSIS — C773 Secondary and unspecified malignant neoplasm of axilla and upper limb lymph nodes: Secondary | ICD-10-CM

## 2011-04-15 LAB — CBC WITH DIFFERENTIAL/PLATELET
BASO%: 1.4 % (ref 0.0–2.0)
Basophils Absolute: 0.1 10*3/uL (ref 0.0–0.1)
EOS%: 0.5 % (ref 0.0–7.0)
Eosinophils Absolute: 0 10*3/uL (ref 0.0–0.5)
HCT: 31.5 % — ABNORMAL LOW (ref 34.8–46.6)
HGB: 10.4 g/dL — ABNORMAL LOW (ref 11.6–15.9)
LYMPH%: 30.4 % (ref 14.0–49.7)
MCH: 29.5 pg (ref 25.1–34.0)
MCHC: 33 g/dL (ref 31.5–36.0)
MCV: 89.5 fL (ref 79.5–101.0)
MONO#: 0.4 10*3/uL (ref 0.1–0.9)
MONO%: 8.3 % (ref 0.0–14.0)
NEUT#: 2.5 10*3/uL (ref 1.5–6.5)
NEUT%: 59.4 % (ref 38.4–76.8)
Platelets: 304 10*3/uL (ref 145–400)
RBC: 3.52 10*6/uL — ABNORMAL LOW (ref 3.70–5.45)
RDW: 22.2 % — ABNORMAL HIGH (ref 11.2–14.5)
WBC: 4.2 10*3/uL (ref 3.9–10.3)
lymph#: 1.3 10*3/uL (ref 0.9–3.3)
nRBC: 0 % (ref 0–0)

## 2011-04-15 LAB — COMPREHENSIVE METABOLIC PANEL
ALT: 16 U/L (ref 0–35)
AST: 13 U/L (ref 0–37)
Albumin: 4.6 g/dL (ref 3.5–5.2)
Alkaline Phosphatase: 56 U/L (ref 39–117)
BUN: 12 mg/dL (ref 6–23)
CO2: 29 mEq/L (ref 19–32)
Calcium: 10 mg/dL (ref 8.4–10.5)
Chloride: 100 mEq/L (ref 96–112)
Creatinine, Ser: 0.66 mg/dL (ref 0.50–1.10)
Glucose, Bld: 128 mg/dL — ABNORMAL HIGH (ref 70–99)
Potassium: 3.5 mEq/L (ref 3.5–5.3)
Sodium: 138 mEq/L (ref 135–145)
Total Bilirubin: 0.4 mg/dL (ref 0.3–1.2)
Total Protein: 7.1 g/dL (ref 6.0–8.3)

## 2011-04-15 MED ORDER — DIPHENHYDRAMINE HCL 50 MG/ML IJ SOLN
50.0000 mg | Freq: Once | INTRAMUSCULAR | Status: AC
  Administered 2011-04-15: 50 mg via INTRAVENOUS

## 2011-04-15 MED ORDER — SODIUM CHLORIDE 0.9 % IV SOLN
Freq: Once | INTRAVENOUS | Status: AC
  Administered 2011-04-15: 12:00:00 via INTRAVENOUS

## 2011-04-15 MED ORDER — PACLITAXEL CHEMO INJECTION 300 MG/50ML
80.0000 mg/m2 | Freq: Once | INTRAVENOUS | Status: AC
  Administered 2011-04-15: 138 mg via INTRAVENOUS
  Filled 2011-04-15: qty 23

## 2011-04-15 MED ORDER — DEXAMETHASONE SODIUM PHOSPHATE 4 MG/ML IJ SOLN
4.0000 mg | Freq: Once | INTRAMUSCULAR | Status: AC
  Administered 2011-04-15: 4 mg via INTRAVENOUS

## 2011-04-15 MED ORDER — ONDANSETRON 8 MG/50ML IVPB (CHCC)
8.0000 mg | Freq: Once | INTRAVENOUS | Status: AC
  Administered 2011-04-15: 8 mg via INTRAVENOUS

## 2011-04-15 MED ORDER — SODIUM CHLORIDE 0.9 % IJ SOLN
10.0000 mL | INTRAMUSCULAR | Status: DC | PRN
  Administered 2011-04-15: 10 mL
  Filled 2011-04-15: qty 10

## 2011-04-15 MED ORDER — HEPARIN SOD (PORK) LOCK FLUSH 100 UNIT/ML IV SOLN
500.0000 [IU] | Freq: Once | INTRAVENOUS | Status: AC | PRN
  Administered 2011-04-15: 500 [IU]
  Filled 2011-04-15: qty 5

## 2011-04-15 MED ORDER — FAMOTIDINE IN NACL 20-0.9 MG/50ML-% IV SOLN
20.0000 mg | Freq: Once | INTRAVENOUS | Status: AC
  Administered 2011-04-15: 20 mg via INTRAVENOUS

## 2011-04-15 NOTE — Progress Notes (Signed)
ID: [name]   Interval History: [name] for followup of her breast cancer. Today she is due to receive her third of 12 planned weekly doses of paclitaxel.   ROS: Generally she is tolerating the treatment well. She doesn't have as much energy as she wished, and only did cooking one time in this past week. Her sugars are better now that we are using less steroids in the premeds. She has a little bit of tingling in her toes not at all in her fingertips. She thinks this is really not a change from baseline but isn't sure. She is paying more attention to this which is a good thing. Occasionally she feels her vision is blurred particularly when she reads. Her appetite is not as good as it used to be. She is having some hot flashes. Otherwise a detailed review of systems today was noncontributory.  Medications: I have reviewed the patient's current medications.  Current Outpatient Prescriptions  Medication Sig Dispense Refill  . aspirin 81 MG tablet Take 81 mg by mouth daily.        . fluticasone (FLONASE) 50 MCG/ACT nasal spray Place 2 sprays into the nose daily.  16 g  2  . glipiZIDE (GLUCOTROL) 10 MG tablet Take 1 tablet (10 mg total) by mouth 2 (two) times daily before a meal.  90 tablet  3  . hydrochlorothiazide 25 MG tablet Take 25 mg by mouth daily.        Marland Kitchen lidocaine-prilocaine (EMLA) cream Apply 1 application topically as needed. Prior to port access       . lisinopril (PRINIVIL,ZESTRIL) 40 MG tablet Take 1 tablet (40 mg total) by mouth daily.  30 tablet  11  . metFORMIN (GLUCOPHAGE) 500 MG tablet Take 1 tablet (500 mg total) by mouth 2 (two) times daily with a meal.  90 tablet  3  . oxyCODONE-acetaminophen (PERCOCET) 5-325 MG per tablet Take 1 tablet by mouth every 6 (six) hours as needed. For pain       . polyvinyl alcohol-povidone (REFRESH) 1.4-0.6 % ophthalmic solution Apply 1 drop to eye as needed. Diabetic eyes/dry eyes       . pravastatin (PRAVACHOL) 40 MG tablet Take  1 tablet (40 mg total) by mouth at bedtime.  90 tablet  5  . prochlorperazine (COMPAZINE) 10 MG tablet Take 10 mg by mouth 4 (four) times daily -  before meals and at bedtime. For nausea       . tobramycin-dexamethasone (TOBRADEX) ophthalmic solution 1 drop in each eye 1-2 times daily while receiving chemo  5 mL  2   Family History: patient's mother alive age 74; father deceased, cause unknown. The patient has 8 sisters and 9 brothers.  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  Objective:  Filed Vitals:   04/15/11 1040  BP: 130/85  Pulse: 92  Temp: 97.4 F (36.3 Ryan)     Physical Exam:    Sclerae unicteric  Oropharynx clear  No peripheral adenopathy  Lungs clear -- no rales or rhonchi  Heart regular rate and rhythm  Abdomen benign  MSK no focal spinal tenderness, no peripheral edema  Neuro nonfocal  Breast exam: Right breast status post mastectomy, no evidence of local recurrence. Left  breast no suspicious findings.  Skin: Some nail hyperpigmentation. Port is intact.  Lab Results:  CMP      Component Value Date/Time   NA 138 04/08/2011 0851   K 3.6 04/08/2011 0851   CL 99 04/08/2011 0851   CO2 28 04/08/2011 0851   GLUCOSE 116* 04/08/2011 0851   BUN 14 04/08/2011 0851   CREATININE 0.65 04/08/2011 0851   CALCIUM 9.7 04/08/2011 0851   PROT 6.6 04/08/2011 0851   ALBUMIN 4.3 04/08/2011 0851   AST 13 04/08/2011 0851   ALT 16 04/08/2011 0851   ALKPHOS 63 04/08/2011 0851   BILITOT 0.3 04/08/2011 0851   GFRNONAA >90 03/06/2011 1033   GFRAA >90 03/06/2011 1033    CBC Lab Results  Component Value Date   WBC 4.2 04/15/2011   HGB 10.4* 04/15/2011   HCT 31.5* 04/15/2011   MCV 89.5 04/15/2011   PLT 304 04/15/2011     Studies/Results:  Dg Chest 2 View  03/13/2011  *RADIOLOGY REPORT*  Clinical Data: Evaluate Port-A-Cath.  CHEST - 2 VIEW  Comparison: Chest x-ray 03/07/2011.  Findings: The Port-A-Cath appears stable in position and appearance.  Very slight kinking of the catheter near the inferior margin of the clavicle.  The cardiac silhouette, mediastinal and hilar contours are stable.  Stable surgical changes from a right mastectomy.  No acute pulmonary findings.  IMPRESSION: Stable appearance of the left-sided Port-A-Cath.  Very slight kinking of the catheter near the inferior margin of the clavicle.  Original Report Authenticated By: P. Loralie Champagne, M.D.    Assessment:  65 year old BRCA-2 positive [name] woman, status post right mastectomy and axillary lymph node dissection August of 2012 for a T1c N1 (Stage II) invasive ductal carcinoma, grade 3, estrogen and progesterone receptor positive, HER-2 negative, with an MIB-1-1 of 62%, status post 4 cycles of doxorubicin and cyclophosphamide given in dose dense fashion, currently receiving weekly paclitaxel  Plan: She will receive her third paclitaxel dose today and we will continue to see her on a weekly basis, with close followup with any worsening of peripheral neuropathy in this diabetic patient. When the patient completes chemotherapy she will need to have her remaining ovary removed. She will need yearly MRIs in addition to mammography. We'll also have to discuss testing her family members.  [name] 04/15/2011

## 2011-04-22 ENCOUNTER — Other Ambulatory Visit: Payer: Self-pay | Admitting: Oncology

## 2011-04-22 ENCOUNTER — Other Ambulatory Visit (HOSPITAL_BASED_OUTPATIENT_CLINIC_OR_DEPARTMENT_OTHER): Payer: Self-pay | Admitting: Lab

## 2011-04-22 ENCOUNTER — Encounter: Payer: Self-pay | Admitting: Physician Assistant

## 2011-04-22 ENCOUNTER — Telehealth: Payer: Self-pay | Admitting: *Deleted

## 2011-04-22 ENCOUNTER — Ambulatory Visit (HOSPITAL_BASED_OUTPATIENT_CLINIC_OR_DEPARTMENT_OTHER): Payer: Self-pay | Admitting: Physician Assistant

## 2011-04-22 ENCOUNTER — Ambulatory Visit (HOSPITAL_BASED_OUTPATIENT_CLINIC_OR_DEPARTMENT_OTHER): Payer: Self-pay

## 2011-04-22 VITALS — BP 150/87 | HR 81 | Temp 97.7°F

## 2011-04-22 DIAGNOSIS — C50319 Malignant neoplasm of lower-inner quadrant of unspecified female breast: Secondary | ICD-10-CM

## 2011-04-22 DIAGNOSIS — C773 Secondary and unspecified malignant neoplasm of axilla and upper limb lymph nodes: Secondary | ICD-10-CM

## 2011-04-22 DIAGNOSIS — E119 Type 2 diabetes mellitus without complications: Secondary | ICD-10-CM

## 2011-04-22 DIAGNOSIS — C50919 Malignant neoplasm of unspecified site of unspecified female breast: Secondary | ICD-10-CM

## 2011-04-22 DIAGNOSIS — Z5111 Encounter for antineoplastic chemotherapy: Secondary | ICD-10-CM

## 2011-04-22 DIAGNOSIS — G609 Hereditary and idiopathic neuropathy, unspecified: Secondary | ICD-10-CM

## 2011-04-22 LAB — COMPREHENSIVE METABOLIC PANEL
ALT: 17 U/L (ref 0–35)
AST: 14 U/L (ref 0–37)
Albumin: 4.3 g/dL (ref 3.5–5.2)
Alkaline Phosphatase: 49 U/L (ref 39–117)
BUN: 9 mg/dL (ref 6–23)
CO2: 30 mEq/L (ref 19–32)
Calcium: 9.9 mg/dL (ref 8.4–10.5)
Chloride: 102 mEq/L (ref 96–112)
Creatinine, Ser: 0.65 mg/dL (ref 0.50–1.10)
Glucose, Bld: 86 mg/dL (ref 70–99)
Potassium: 3.4 mEq/L — ABNORMAL LOW (ref 3.5–5.3)
Sodium: 140 mEq/L (ref 135–145)
Total Bilirubin: 0.4 mg/dL (ref 0.3–1.2)
Total Protein: 6.8 g/dL (ref 6.0–8.3)

## 2011-04-22 LAB — CBC WITH DIFFERENTIAL/PLATELET
BASO%: 0.9 % (ref 0.0–2.0)
Basophils Absolute: 0 10*3/uL (ref 0.0–0.1)
EOS%: 0.9 % (ref 0.0–7.0)
Eosinophils Absolute: 0 10*3/uL (ref 0.0–0.5)
HCT: 29.3 % — ABNORMAL LOW (ref 34.8–46.6)
HGB: 9.6 g/dL — ABNORMAL LOW (ref 11.6–15.9)
LYMPH%: 30.2 % (ref 14.0–49.7)
MCH: 29.9 pg (ref 25.1–34.0)
MCHC: 32.8 g/dL (ref 31.5–36.0)
MCV: 91.3 fL (ref 79.5–101.0)
MONO#: 0.3 10*3/uL (ref 0.1–0.9)
MONO%: 6.4 % (ref 0.0–14.0)
NEUT#: 2.6 10*3/uL (ref 1.5–6.5)
NEUT%: 61.6 % (ref 38.4–76.8)
Platelets: 239 10*3/uL (ref 145–400)
RBC: 3.21 10*6/uL — ABNORMAL LOW (ref 3.70–5.45)
RDW: 21.9 % — ABNORMAL HIGH (ref 11.2–14.5)
WBC: 4.2 10*3/uL (ref 3.9–10.3)
lymph#: 1.3 10*3/uL (ref 0.9–3.3)
nRBC: 0 % (ref 0–0)

## 2011-04-22 LAB — CANCER ANTIGEN 27.29: CA 27.29: 14 U/mL (ref 0–39)

## 2011-04-22 MED ORDER — SODIUM CHLORIDE 0.9 % IJ SOLN
10.0000 mL | INTRAMUSCULAR | Status: DC | PRN
  Administered 2011-04-22: 10 mL
  Filled 2011-04-22: qty 10

## 2011-04-22 MED ORDER — SODIUM CHLORIDE 0.9 % IV SOLN
Freq: Once | INTRAVENOUS | Status: AC
  Administered 2011-04-22: 50 mL via INTRAVENOUS

## 2011-04-22 MED ORDER — FAMOTIDINE IN NACL 20-0.9 MG/50ML-% IV SOLN
20.0000 mg | Freq: Once | INTRAVENOUS | Status: AC
  Administered 2011-04-22: 20 mg via INTRAVENOUS

## 2011-04-22 MED ORDER — HEPARIN SOD (PORK) LOCK FLUSH 100 UNIT/ML IV SOLN
500.0000 [IU] | Freq: Once | INTRAVENOUS | Status: AC | PRN
  Administered 2011-04-22: 500 [IU]
  Filled 2011-04-22: qty 5

## 2011-04-22 MED ORDER — ONDANSETRON 8 MG/50ML IVPB (CHCC)
8.0000 mg | Freq: Once | INTRAVENOUS | Status: AC
Start: 2011-04-22 — End: 2011-04-22
  Administered 2011-04-22: 8 mg via INTRAVENOUS

## 2011-04-22 MED ORDER — DIPHENHYDRAMINE HCL 50 MG/ML IJ SOLN
50.0000 mg | Freq: Once | INTRAMUSCULAR | Status: AC
  Administered 2011-04-22: 50 mg via INTRAVENOUS

## 2011-04-22 MED ORDER — PACLITAXEL CHEMO INJECTION 300 MG/50ML
80.0000 mg/m2 | Freq: Once | INTRAVENOUS | Status: AC
  Administered 2011-04-22: 138 mg via INTRAVENOUS
  Filled 2011-04-22: qty 23

## 2011-04-22 MED ORDER — DEXAMETHASONE SODIUM PHOSPHATE 4 MG/ML IJ SOLN
4.0000 mg | Freq: Once | INTRAMUSCULAR | Status: AC
  Administered 2011-04-22: 4 mg via INTRAVENOUS

## 2011-04-22 NOTE — Progress Notes (Signed)
Amy Berry PA in infusion to see pt. Today.  Pt. Voiced mild neuropathy to lt fingers and feet.   Also c/o'ed of rt. knee pain prior to cancer dx.  Has pain meds at home.  Currently not experiencing pain. HL

## 2011-04-22 NOTE — Progress Notes (Signed)
ID: [name]  HPI: [name] is a 65 year old [place] woman referred by [name] for evaluation and treatment in the setting of newly diagnosed breast cancer.     [name] had screening mammography Sep 11, 2010, at [facility].  This showed a possible right axillary mass and she was brought back for additional views Sep 20, 2010.  An ultrasound showed abnormal enlarged lymph nodes in the right axilla measuring a maximum of 2.2 cm.  Biopsy was suggested and performed on Sep 25, 2010.  The pathology from that procedure ([id number]) showed metastatic adenocarcinoma consistent with a breast primary.  In particular, the tumor was estrogen and progesterone receptor positive, HER2/neu was negative, the MIB-1 was 40%.   With this information, the patient was referred for bilateral breast MRIs performed May 29.  This showed a complex picture, there being in addition to 2 pathologically enlarged lymph nodes in the right axilla and then other mildly prominent lymph nodes, 2 masses in the breast itself, one was subareolar and measured 1.3 cm, the second mass was more posterior and inferior and measured 8 mm.  The distance between these 2 masses was 4.5 cm. the second breast mass was biopsied and was found to be benign.  She has a deleterious BRCA-2 mutation, namely 6696 deletion TC.  Status post right mastectomy and axillary lymph node dissection December 04, 2010 for a 1.5 cm invasive ductal carcinoma, grade 3, involving 3/12 axillary lymph nodes, with ample margins, T1c N1 M0, estrogen receptor 94% positive, progesterone receptor 4% positive with an MIB-1 of 62% and no HER2 amplification on the original material.    The patient is currently being treated in the adjuvant setting, the goal being to complete 4 doses of doxorubicin and cyclophosphamide, followed by 12 weekly doses of paclitaxel.  Interim History:  The patient returns today for her fourth weekly dose of paclitaxel given  in the adjuvant setting. She is evaluated in the infusion room today, and is accompanied by a friend.  Overall patient is feeling very well. She really has very few complaints. She continues to have some occasional tingling in her toes and occasionally in her left fingertips but notes that this is not worsened or increased whatsoever since her last dose of paclitaxel. She is able to perform fine motor skills. She is noted no change in her day-to-day activities secondary to this tingling. She is followed very closely.  Otherwise the patient has no mouth ulcers or oral sensitivity. She has some mild hyperpigmentation but no additional skin changes or nail bed changes. No excessive tearing. No nausea or emesis. No change in bowel habits.  A detailed review of systems is otherwise noncontributory as noted below.  Review of Systems: Constitutional:  no weight loss, fever, night sweats and feels well Eyes: negative [id number] Cardiovascular: no chest pain or dyspnea on exertion Respiratory: no cough, shortness of breath, or wheezing Neurological: negative Dermatological: negative Gastrointestinal: no abdominal pain, change in bowel habits, or black or bloody stools Genito-Urinary: no dysuria, trouble voiding, or hematuria Hematological and Lymphatic: negative Breast: negative Musculoskeletal: negative Remaining ROS negative.  Medications: I have reviewed the patient's current medications.  Current Outpatient Prescriptions  Medication Sig Dispense Refill  . aspirin 81 MG tablet Take 81 mg by mouth daily.        . fluticasone (FLONASE) 50 MCG/ACT nasal spray Place 2 sprays into the nose daily.  16 g  2  . glipiZIDE (GLUCOTROL) 10 MG tablet Take 1  tablet (10 mg total) by mouth 2 (two) times daily before a meal.  90 tablet  3  . hydrochlorothiazide 25 MG tablet Take 25 mg by mouth daily.        Marland Kitchen lidocaine-prilocaine (EMLA) cream Apply 1 application topically as needed. Prior to port access        . lisinopril (PRINIVIL,ZESTRIL) 40 MG tablet Take 1 tablet (40 mg total) by mouth daily.  30 tablet  11  . metFORMIN (GLUCOPHAGE) 500 MG tablet Take 1 tablet (500 mg total) by mouth 2 (two) times daily with a meal.  90 tablet  3  . oxyCODONE-acetaminophen (PERCOCET) 5-325 MG per tablet Take 1 tablet by mouth every 6 (six) hours as needed. For pain       . polyvinyl alcohol-povidone (REFRESH) 1.4-0.6 % ophthalmic solution Apply 1 drop to eye as needed. Diabetic eyes/dry eyes       . pravastatin (PRAVACHOL) 40 MG tablet Take 1 tablet (40 mg total) by mouth at bedtime.  90 tablet  5  . prochlorperazine (COMPAZINE) 10 MG tablet Take 10 mg by mouth 4 (four) times daily -  before meals and at bedtime. For nausea       . tobramycin-dexamethasone (TOBRADEX) ophthalmic solution 1 drop in each eye 1-2 times daily while receiving chemo  5 mL  2   No current facility-administered medications for this visit.   Facility-Administered Medications Ordered in Other Visits  Medication Dose Route Frequency Provider Last Rate Last Dose  . 0.9 %  sodium chloride infusion   Intravenous Once Talik Casique, PA 20 mL/hr at 04/22/11 1035 50 mL at 04/22/11 1035  . dexamethasone (DECADRON) injection 4 mg  4 mg Intravenous Once Naydeen Speirs, PA   4 mg at 04/22/11 1049  . diphenhydrAMINE (BENADRYL) injection 50 mg  50 mg Intravenous Once Faustina Gebert, PA   50 mg at 04/22/11 1049  . famotidine (PEPCID) IVPB 20 mg  20 mg Intravenous Once Zollie Scale, PA   20 mg at 04/22/11 1107  . heparin lock flush 100 unit/mL  500 Units Intracatheter Once PRN Zollie Scale, PA   500 Units at 04/22/11 1248  . ondansetron (ZOFRAN) IVPB 8 mg  8 mg Intravenous Once Miki Labuda, PA   8 mg at 04/22/11 1049  . PACLitaxel (TAXOL) 138 mg in dextrose 5 % 250 mL chemo infusion (</= 80mg /m2)  80 mg/m2 (Treatment Plan Actual) Intravenous Once Zollie Scale, PA 273 mL/hr at 04/22/11 1132 138 mg at 04/22/11 1132  . sodium chloride 0.9 % injection 10 mL  10 mL Intracatheter PRN  Zollie Scale, PA   10 mL at 04/22/11 1248   Family History: patient's mother alive age 58; father deceased, cause unknown. The patient has 8 sisters and 9 brothers.  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  Physical Exam: There were no vitals filed for this visit. HEENT:  Sclerae anicteric. Oropharynx clear.  No mucositis or candidiasis.   Nodes:  No cervical, supraclavicular, or axillary lymphadenopathy palpated.  Breast Exam:  Deferred Lungs:  Clear to auscultation bilaterally.  No crackles, rhonchi, or wheezes.   Heart:  Regular rate and rhythm.   Abdomen:  Soft, nontender.  Positive bowel sounds.   Extremities:  Benign.  No peripheral edema or cyanosis.   Skin:  Benign.   Neuro:  Nonfocal.   Lab Results:  CMP      Component Value Date/Time   NA 140 04/22/2011 0937   K 3.4* 04/22/2011 0937   CL 102 04/22/2011 0937   CO2 30 04/22/2011 0937   GLUCOSE 86 04/22/2011 0937   BUN 9 04/22/2011 0937   CREATININE 0.65 04/22/2011 0937   CALCIUM 9.9 04/22/2011 0937   PROT 6.8 04/22/2011 0937   ALBUMIN 4.3 04/22/2011 0937   AST 14 04/22/2011 0937   ALT 17 04/22/2011 0937   ALKPHOS 49 04/22/2011 0937   BILITOT 0.4 04/22/2011 0937   GFRNONAA >90 03/06/2011 1033   GFRAA >90 03/06/2011 1033    CBC Lab Results  Component Value Date   WBC 4.2 04/22/2011   HGB 9.6* 04/22/2011   HCT 29.3* 04/22/2011   MCV 91.3 04/22/2011   PLT 239 04/22/2011    Studies/Results:  Dg Chest 2 View  03/13/2011  *RADIOLOGY REPORT*  Clinical Data: Evaluate Port-A-Cath.  CHEST - 2 VIEW  Comparison: Chest x-ray 03/07/2011.  Findings: The Port-A-Cath appears stable in position and appearance.  Very slight kinking of the catheter near the  inferior margin of the clavicle.  The cardiac silhouette, mediastinal and hilar contours are stable.  Stable surgical changes from a right mastectomy.  No acute pulmonary findings.  IMPRESSION: Stable appearance of the left-sided Port-A-Cath.  Very slight kinking of the catheter near the inferior margin of the clavicle.  Original Report Authenticated By: P. Loralie Champagne, M.D.    Assessment:  65 year old BRCA-2 positive [name] woman, status post right mastectomy and axillary lymph node dissection August of 2012 for a T1c N1 (Stage II) invasive ductal carcinoma, grade 3, estrogen and progesterone receptor positive, HER-2 negative, with an MIB-1-1 of 62%, status post 4 cycles of doxorubicin and cyclophosphamide given in dose dense fashion, currently receiving weekly paclitaxel  Plan:  Patient will receive her fourth weekly dose of paclitaxel today as planned. She would like to take next week off for the Christmas holiday, and return for cycle 5 on January 2. The following week will return to Tuesday appointments, and I will see her again on January 8 prior to her sixth dose of paclitaxel. We'll continue to follow her peripheral neuropathy very closely, looking for any increased, at which point we will need to reassess, especially in light of her known diabetes.  When the patient completes chemotherapy she will need to have her remaining ovary removed. She will need yearly MRIs in addition to mammography. We'll also have to discuss testing her family members.  The patient voices understanding and agreement with our plan. She knows to call with any changes or problems prior to her next scheduled appointment.  Calleigh Lafontant, PA-C 04/22/2011

## 2011-04-22 NOTE — Telephone Encounter (Signed)
gave patient appointment for 05-2011 printed out calendar and gave to the patient 

## 2011-04-22 NOTE — Patient Instructions (Signed)
[facility] Discharge Instructions for Patients Receiving Chemotherapy  Today you received the following chemotherapy agents Taxol  To help prevent nausea and vomiting after your treatment, we encourage you to take your nausea medication as prescribed by Dr. Darnelle Catalan.  If you develop nausea and vomiting that is not controlled by your nausea medication, call the clinic. If it is after clinic hours your family physician or the after hours number for the clinic or go to the Emergency Department.   BELOW ARE SYMPTOMS THAT SHOULD BE REPORTED IMMEDIATELY:  *FEVER GREATER THAN 100.5 F  *CHILLS WITH OR WITHOUT FEVER  NAUSEA AND VOMITING THAT IS NOT CONTROLLED WITH YOUR NAUSEA MEDICATION  *UNUSUAL SHORTNESS OF BREATH  *UNUSUAL BRUISING OR BLEEDING  TENDERNESS IN MOUTH AND THROAT WITH OR WITHOUT PRESENCE OF ULCERS  *URINARY PROBLEMS  *BOWEL PROBLEMS  UNUSUAL RASH Items with * indicate a potential emergency and should be followed up as soon as possible.   Feel free to call the clinic you have any questions or concerns. The clinic phone number is [phone].   I have been informed and understand all the instructions given to me. I know to contact the clinic, my physician, or go to the Emergency Department if any problems should occur. I do not have any questions at this time, but understand that I may call the clinic during office hours   should I have any questions or need assistance in obtaining follow up care.    __________________________________________  _____________  __________ Signature of Patient or Authorized Representative            Date                   Time    __________________________________________ Nurse's Signature

## 2011-05-07 ENCOUNTER — Encounter: Payer: Self-pay | Admitting: Physician Assistant

## 2011-05-07 ENCOUNTER — Other Ambulatory Visit: Payer: Self-pay | Admitting: Lab

## 2011-05-07 ENCOUNTER — Ambulatory Visit (HOSPITAL_BASED_OUTPATIENT_CLINIC_OR_DEPARTMENT_OTHER): Payer: Self-pay | Admitting: Physician Assistant

## 2011-05-07 ENCOUNTER — Ambulatory Visit (HOSPITAL_BASED_OUTPATIENT_CLINIC_OR_DEPARTMENT_OTHER): Payer: Self-pay

## 2011-05-07 VITALS — BP 147/85 | HR 84 | Temp 98.4°F | Ht 58.5 in | Wt 153.6 lb

## 2011-05-07 DIAGNOSIS — C773 Secondary and unspecified malignant neoplasm of axilla and upper limb lymph nodes: Secondary | ICD-10-CM

## 2011-05-07 DIAGNOSIS — C50919 Malignant neoplasm of unspecified site of unspecified female breast: Secondary | ICD-10-CM

## 2011-05-07 DIAGNOSIS — Z5111 Encounter for antineoplastic chemotherapy: Secondary | ICD-10-CM

## 2011-05-07 LAB — COMPREHENSIVE METABOLIC PANEL
ALT: 22 U/L (ref 0–35)
AST: 16 U/L (ref 0–37)
Albumin: 4.1 g/dL (ref 3.5–5.2)
Alkaline Phosphatase: 48 U/L (ref 39–117)
BUN: 13 mg/dL (ref 6–23)
CO2: 30 mEq/L (ref 19–32)
Calcium: 9.3 mg/dL (ref 8.4–10.5)
Chloride: 105 mEq/L (ref 96–112)
Creatinine, Ser: 0.71 mg/dL (ref 0.50–1.10)
Glucose, Bld: 81 mg/dL (ref 70–99)
Potassium: 3.7 mEq/L (ref 3.5–5.3)
Sodium: 143 mEq/L (ref 135–145)
Total Bilirubin: 0.3 mg/dL (ref 0.3–1.2)
Total Protein: 6.6 g/dL (ref 6.0–8.3)

## 2011-05-07 LAB — CBC WITH DIFFERENTIAL/PLATELET
BASO%: 0.4 % (ref 0.0–2.0)
Basophils Absolute: 0 10*3/uL (ref 0.0–0.1)
EOS%: 0.8 % (ref 0.0–7.0)
Eosinophils Absolute: 0 10*3/uL (ref 0.0–0.5)
HCT: 28.7 % — ABNORMAL LOW (ref 34.8–46.6)
HGB: 9.5 g/dL — ABNORMAL LOW (ref 11.6–15.9)
LYMPH%: 24.4 % (ref 14.0–49.7)
MCH: 31.6 pg (ref 25.1–34.0)
MCHC: 33.2 g/dL (ref 31.5–36.0)
MCV: 95.2 fL (ref 79.5–101.0)
MONO#: 0.5 10*3/uL (ref 0.1–0.9)
MONO%: 11.2 % (ref 0.0–14.0)
NEUT#: 2.7 10*3/uL (ref 1.5–6.5)
NEUT%: 63.2 % (ref 38.4–76.8)
Platelets: 311 10*3/uL (ref 145–400)
RBC: 3.02 10*6/uL — ABNORMAL LOW (ref 3.70–5.45)
RDW: 23.2 % — ABNORMAL HIGH (ref 11.2–14.5)
WBC: 4.3 10*3/uL (ref 3.9–10.3)
lymph#: 1.1 10*3/uL (ref 0.9–3.3)

## 2011-05-07 MED ORDER — DEXAMETHASONE SODIUM PHOSPHATE 4 MG/ML IJ SOLN
4.0000 mg | Freq: Once | INTRAMUSCULAR | Status: AC
  Administered 2011-05-07: 4 mg via INTRAVENOUS

## 2011-05-07 MED ORDER — ONDANSETRON 8 MG/50ML IVPB (CHCC)
8.0000 mg | Freq: Once | INTRAVENOUS | Status: AC
  Administered 2011-05-07: 8 mg via INTRAVENOUS

## 2011-05-07 MED ORDER — SODIUM CHLORIDE 0.9 % IJ SOLN
10.0000 mL | INTRAMUSCULAR | Status: DC | PRN
  Administered 2011-05-07: 10 mL
  Filled 2011-05-07: qty 10

## 2011-05-07 MED ORDER — PACLITAXEL CHEMO INJECTION 300 MG/50ML
80.0000 mg/m2 | Freq: Once | INTRAVENOUS | Status: AC
  Administered 2011-05-07: 138 mg via INTRAVENOUS
  Filled 2011-05-07: qty 23

## 2011-05-07 MED ORDER — DIPHENHYDRAMINE HCL 50 MG/ML IJ SOLN
50.0000 mg | Freq: Once | INTRAMUSCULAR | Status: AC
  Administered 2011-05-07: 50 mg via INTRAVENOUS

## 2011-05-07 MED ORDER — FAMOTIDINE IN NACL 20-0.9 MG/50ML-% IV SOLN
20.0000 mg | Freq: Once | INTRAVENOUS | Status: AC
  Administered 2011-05-07: 20 mg via INTRAVENOUS

## 2011-05-07 MED ORDER — HEPARIN SOD (PORK) LOCK FLUSH 100 UNIT/ML IV SOLN
500.0000 [IU] | Freq: Once | INTRAVENOUS | Status: AC | PRN
  Administered 2011-05-07: 500 [IU]
  Filled 2011-05-07: qty 5

## 2011-05-07 MED ORDER — SODIUM CHLORIDE 0.9 % IV SOLN
Freq: Once | INTRAVENOUS | Status: AC
  Administered 2011-05-07: 11:00:00 via INTRAVENOUS

## 2011-05-07 NOTE — Progress Notes (Signed)
ID: [name]  HPI: [name] is a 66 year old [place] woman referred by Dr. Luisa Hart for evaluation and treatment in the setting of newly diagnosed breast cancer.     [name] had screening mammography Sep 11, 2010, at [facility].  This showed a possible right axillary mass and she was brought back for additional views Sep 20, 2010.  An ultrasound showed abnormal enlarged lymph nodes in the right axilla measuring a maximum of 2.2 cm.  Biopsy was suggested and performed on Sep 25, 2010.  The pathology from that procedure ([id number]) showed metastatic adenocarcinoma consistent with a breast primary.  In particular, the tumor was estrogen and progesterone receptor positive, HER2/neu was negative, the MIB-1 was 40%.   With this information, the patient was referred for bilateral breast MRIs performed May 29.  This showed a complex picture, there being in addition to 2 pathologically enlarged lymph nodes in the right axilla and then other mildly prominent lymph nodes, 2 masses in the breast itself, one was subareolar and measured 1.3 cm, the second mass was more posterior and inferior and measured 8 mm.  The distance between these 2 masses was 4.5 cm. the second breast mass was biopsied and was found to be benign.  She has a deleterious BRCA-2 mutation, namely 6696 deletion TC.  Status post right mastectomy and axillary lymph node dissection December 04, 2010 for a 1.5 cm invasive ductal carcinoma, grade 3, involving 3/12 axillary lymph nodes, with ample margins, T1c N1 M0, estrogen receptor 94% positive, progesterone receptor 4% positive with an MIB-1 of 62% and no HER2 amplification on the original material.    The patient is currently being treated in the adjuvant setting, the goal being to complete 4 doses of doxorubicin and cyclophosphamide, followed by 12 weekly doses of paclitaxel.  Interim History:  Patient returns today for followup of her right breast carcinoma, due  for her fifth weekly dose of paclitaxel today, given in the adjuvant setting. Per her request, we held treatment last week due to the Christmas holiday. She is able to enjoy the holiday with her family, although she admits she was "very tired".  Otherwise, interval history is unremarkable. The patient continues to feel well, tolerating the paclitaxel with very few side effects. She continues to have some intermittent tingling only in her toes, worse in the right than the left. Again this as mild, intermittent, and is not affecting any of her day-to-day activities. She denies any signs of numbness or tingling today in the upper extremities. She's had no mouth ulcers or oral sensitivity. Her skin is a little dry and she has some hyperpigmentation, but no additional rashes or skin changes noted. No excessive tearing. She has occasional nausea, no emesis, and finds that the prochlorperazine is sufficient for controlling the nausea. No change in bowel habits. Her blood sugars have been "okay" per her report.  A detailed review of systems is otherwise noncontributory as noted below.  Review of Systems: Constitutional:  no weight loss, fever, night sweats and feels well Eyes: negative [id number] Cardiovascular: no chest pain or dyspnea on exertion Respiratory: no cough, shortness of breath, or wheezing Neurological: occasional tingling in lower extremities Dermatological: negative Gastrointestinal: no abdominal pain, change in bowel habits, or black or bloody stools Genito-Urinary: no dysuria, trouble voiding, or hematuria Hematological and Lymphatic: negative Breast: negative Musculoskeletal: negative Remaining ROS negative.  Medications: I have reviewed the patient's current medications.  Current Outpatient Prescriptions  Medication Sig Dispense Refill  .  aspirin 81 MG tablet Take 81 mg by mouth daily.        . fluticasone (FLONASE) 50 MCG/ACT nasal spray Place 2 sprays into the nose daily.   16 g  2  . glipiZIDE (GLUCOTROL) 10 MG tablet Take 1 tablet (10 mg total) by mouth 2 (two) times daily before a meal.  90 tablet  3  . hydrochlorothiazide 25 MG tablet Take 25 mg by mouth daily.        Marland Kitchen lidocaine-prilocaine (EMLA) cream Apply 1 application topically as needed. Prior to port access       . lisinopril (PRINIVIL,ZESTRIL) 40 MG tablet Take 1 tablet (40 mg total) by mouth daily.  30 tablet  11  . metFORMIN (GLUCOPHAGE) 500 MG tablet Take 1 tablet (500 mg total) by mouth 2 (two) times daily with a meal.  90 tablet  3  . oxyCODONE-acetaminophen (PERCOCET) 5-325 MG per tablet Take 1 tablet by mouth every 6 (six) hours as needed. For pain       . polyvinyl alcohol-povidone (REFRESH) 1.4-0.6 % ophthalmic solution Apply 1 drop to eye as needed. Diabetic eyes/dry eyes       . pravastatin (PRAVACHOL) 40 MG tablet Take 1 tablet (40 mg total) by mouth at bedtime.  90 tablet  5  . prochlorperazine (COMPAZINE) 10 MG tablet Take 10 mg by mouth 4 (four) times daily -  before meals and at bedtime. For nausea       . tobramycin-dexamethasone (TOBRADEX) ophthalmic solution 1 drop in each eye 1-2 times daily while receiving chemo  5 mL  2   Family History: patient's mother alive age 50; father deceased, cause unknown. The patient has 8 sisters and 9 brothers.  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  Physical Exam: Filed Vitals:   05/07/11 1016  BP: 147/85  Pulse: 84  Temp: 98.4 F (36.9 C)   HEENT:  Sclerae anicteric. Oropharynx clear.  No mucositis or candidiasis.   Nodes:  No cervical, supraclavicular, or axillary lymphadenopathy palpated.  Breast Exam:  Deferred Lungs:  Clear to auscultation bilaterally.  No crackles,  rhonchi, or wheezes.   Heart:  Regular rate and rhythm.   Abdomen:  Soft, nontender.  Positive bowel sounds.   Extremities:  Benign.  No peripheral edema or cyanosis.   Skin:  Hyperpigmented.  Otherwise benign.   Neuro:  Nonfocal.   Lab Results:  CMP      Component Value Date/Time   NA 140 04/22/2011 0937   K 3.4* 04/22/2011 0937   CL 102 04/22/2011 0937   CO2 30 04/22/2011 0937   GLUCOSE 86 04/22/2011 0937   BUN 9 04/22/2011 0937   CREATININE 0.65 04/22/2011 0937   CALCIUM 9.9 04/22/2011 0937   PROT 6.8 04/22/2011 0937   ALBUMIN 4.3 04/22/2011 0937   AST 14 04/22/2011 0937   ALT 17 04/22/2011 0937   ALKPHOS 49 04/22/2011 0937   BILITOT 0.4 04/22/2011 0937   GFRNONAA >90 03/06/2011 1033   GFRAA >90 03/06/2011 1033    CBC Lab Results  Component Value Date   WBC 4.3 05/07/2011   HGB 9.5* 05/07/2011   HCT 28.7* 05/07/2011   MCV 95.2 05/07/2011   PLT 311 05/07/2011    Studies/Results:  Dg Chest 2 View  03/13/2011  *RADIOLOGY REPORT*  Clinical Data: Evaluate Port-A-Cath.  CHEST - 2 VIEW  Comparison: Chest x-ray 03/07/2011.  Findings: The Port-A-Cath appears stable in position and appearance.  Very slight kinking of the catheter near the inferior margin of the clavicle.  The cardiac silhouette, mediastinal and hilar contours are stable.  Stable surgical changes from a right mastectomy.  No acute pulmonary findings.  IMPRESSION: Stable appearance of the left-sided Port-A-Cath.  Very slight kinking of the catheter near the inferior margin of the clavicle.  Original Report Authenticated By: P. Loralie Champagne, M.D.    Assessment:  66 year old BRCA-2 positive [name] woman, status post right mastectomy and axillary lymph node dissection August of 2012 for a T1c N1 (Stage II) invasive ductal carcinoma, grade 3, estrogen and progesterone receptor positive, HER-2 negative, with an MIB-1-1 of 62%,  1.  Status post 4 cycles of doxorubicin and cyclophosphamide given in dose dense fashion,  currently receiving weekly paclitaxel  Plan:  Patient will receive her fifth weekly dose of paclitaxel today as planned. She continues to tolerate treatment well, but we will continue to follow her on a weekly basis. We will follow her closely for any signs of peripheral neuropathy, especially in light of her known diabetes.  When the patient completes chemotherapy she will need to have her remaining ovary removed. She will need yearly MRIs in addition to mammography. We'll also have to discuss testing her family members.  The patient voices understanding and agreement with our plan. She knows to call with any changes or problems prior to her next scheduled appointment.  Herma Uballe, PA-C 05/07/2011

## 2011-05-14 ENCOUNTER — Ambulatory Visit (HOSPITAL_BASED_OUTPATIENT_CLINIC_OR_DEPARTMENT_OTHER): Payer: Self-pay

## 2011-05-14 ENCOUNTER — Ambulatory Visit (HOSPITAL_BASED_OUTPATIENT_CLINIC_OR_DEPARTMENT_OTHER): Payer: Self-pay | Admitting: Physician Assistant

## 2011-05-14 ENCOUNTER — Encounter: Payer: Self-pay | Admitting: Physician Assistant

## 2011-05-14 ENCOUNTER — Other Ambulatory Visit: Payer: Self-pay | Admitting: Lab

## 2011-05-14 ENCOUNTER — Telehealth: Payer: Self-pay | Admitting: Oncology

## 2011-05-14 VITALS — BP 140/82 | HR 99 | Temp 98.3°F | Ht 58.5 in | Wt 155.9 lb

## 2011-05-14 DIAGNOSIS — C50919 Malignant neoplasm of unspecified site of unspecified female breast: Secondary | ICD-10-CM

## 2011-05-14 DIAGNOSIS — C50319 Malignant neoplasm of lower-inner quadrant of unspecified female breast: Secondary | ICD-10-CM

## 2011-05-14 DIAGNOSIS — Z170 Estrogen receptor positive status [ER+]: Secondary | ICD-10-CM

## 2011-05-14 DIAGNOSIS — C773 Secondary and unspecified malignant neoplasm of axilla and upper limb lymph nodes: Secondary | ICD-10-CM

## 2011-05-14 DIAGNOSIS — Z5111 Encounter for antineoplastic chemotherapy: Secondary | ICD-10-CM

## 2011-05-14 DIAGNOSIS — E119 Type 2 diabetes mellitus without complications: Secondary | ICD-10-CM

## 2011-05-14 LAB — CBC WITH DIFFERENTIAL/PLATELET
BASO%: 0.7 % (ref 0.0–2.0)
Basophils Absolute: 0 10*3/uL (ref 0.0–0.1)
EOS%: 1.1 % (ref 0.0–7.0)
Eosinophils Absolute: 0.1 10*3/uL (ref 0.0–0.5)
HCT: 28.6 % — ABNORMAL LOW (ref 34.8–46.6)
HGB: 9.2 g/dL — ABNORMAL LOW (ref 11.6–15.9)
LYMPH%: 31.7 % (ref 14.0–49.7)
MCH: 30.6 pg (ref 25.1–34.0)
MCHC: 32.2 g/dL (ref 31.5–36.0)
MCV: 95 fL (ref 79.5–101.0)
MONO#: 0.2 10*3/uL (ref 0.1–0.9)
MONO%: 4 % (ref 0.0–14.0)
NEUT#: 3.6 10*3/uL (ref 1.5–6.5)
NEUT%: 62.5 % (ref 38.4–76.8)
Platelets: 349 10*3/uL (ref 145–400)
RBC: 3.01 10*6/uL — ABNORMAL LOW (ref 3.70–5.45)
RDW: 20 % — ABNORMAL HIGH (ref 11.2–14.5)
WBC: 5.7 10*3/uL (ref 3.9–10.3)
lymph#: 1.8 10*3/uL (ref 0.9–3.3)
nRBC: 0 % (ref 0–0)

## 2011-05-14 LAB — COMPREHENSIVE METABOLIC PANEL
ALT: 16 U/L (ref 0–35)
AST: 13 U/L (ref 0–37)
Albumin: 4.2 g/dL (ref 3.5–5.2)
Alkaline Phosphatase: 55 U/L (ref 39–117)
BUN: 14 mg/dL (ref 6–23)
CO2: 29 mEq/L (ref 19–32)
Calcium: 9.6 mg/dL (ref 8.4–10.5)
Chloride: 101 mEq/L (ref 96–112)
Creatinine, Ser: 0.68 mg/dL (ref 0.50–1.10)
Glucose, Bld: 85 mg/dL (ref 70–99)
Potassium: 3.4 mEq/L — ABNORMAL LOW (ref 3.5–5.3)
Sodium: 139 mEq/L (ref 135–145)
Total Bilirubin: 0.3 mg/dL (ref 0.3–1.2)
Total Protein: 7.1 g/dL (ref 6.0–8.3)

## 2011-05-14 MED ORDER — FAMOTIDINE IN NACL 20-0.9 MG/50ML-% IV SOLN
20.0000 mg | Freq: Once | INTRAVENOUS | Status: AC
  Administered 2011-05-14: 20 mg via INTRAVENOUS

## 2011-05-14 MED ORDER — SODIUM CHLORIDE 0.9 % IJ SOLN
10.0000 mL | INTRAMUSCULAR | Status: DC | PRN
  Administered 2011-05-14: 10 mL
  Filled 2011-05-14: qty 10

## 2011-05-14 MED ORDER — PACLITAXEL CHEMO INJECTION 300 MG/50ML
80.0000 mg/m2 | Freq: Once | INTRAVENOUS | Status: AC
  Administered 2011-05-14: 138 mg via INTRAVENOUS
  Filled 2011-05-14: qty 23

## 2011-05-14 MED ORDER — DEXAMETHASONE SODIUM PHOSPHATE 4 MG/ML IJ SOLN
4.0000 mg | Freq: Once | INTRAMUSCULAR | Status: AC
  Administered 2011-05-14: 4 mg via INTRAVENOUS

## 2011-05-14 MED ORDER — HEPARIN SOD (PORK) LOCK FLUSH 100 UNIT/ML IV SOLN
500.0000 [IU] | Freq: Once | INTRAVENOUS | Status: AC | PRN
  Administered 2011-05-14: 500 [IU]
  Filled 2011-05-14: qty 5

## 2011-05-14 MED ORDER — ONDANSETRON 8 MG/50ML IVPB (CHCC)
8.0000 mg | Freq: Once | INTRAVENOUS | Status: AC
  Administered 2011-05-14: 8 mg via INTRAVENOUS

## 2011-05-14 MED ORDER — DIPHENHYDRAMINE HCL 50 MG/ML IJ SOLN
50.0000 mg | Freq: Once | INTRAMUSCULAR | Status: AC
  Administered 2011-05-14: 50 mg via INTRAVENOUS

## 2011-05-14 MED ORDER — SODIUM CHLORIDE 0.9 % IV SOLN
Freq: Once | INTRAVENOUS | Status: AC
  Administered 2011-05-14: 15:00:00 via INTRAVENOUS

## 2011-05-14 NOTE — Patient Instructions (Signed)
Patient aware of next appointment; patient discharge, via wheelchair with daughter; patient has no complaints of pain and rxs for nausea and pain at home.

## 2011-05-14 NOTE — Progress Notes (Signed)
ID: [name]  HPI: [name] is a 66 year old [place] woman referred by Dr. Luisa Hart for evaluation and treatment in the setting of newly diagnosed breast cancer.     [name] had screening mammography Sep 11, 2010, at [facility].  This showed a possible right axillary mass and she was brought back for additional views Sep 20, 2010.  An ultrasound showed abnormal enlarged lymph nodes in the right axilla measuring a maximum of 2.2 cm.  Biopsy was suggested and performed on Sep 25, 2010.  The pathology from that procedure ([id number]) showed metastatic adenocarcinoma consistent with a breast primary.  In particular, the tumor was estrogen and progesterone receptor positive, HER2/neu was negative, the MIB-1 was 40%.   With this information, the patient was referred for bilateral breast MRIs performed May 29.  This showed a complex picture, there being in addition to 2 pathologically enlarged lymph nodes in the right axilla and then other mildly prominent lymph nodes, 2 masses in the breast itself, one was subareolar and measured 1.3 cm, the second mass was more posterior and inferior and measured 8 mm.  The distance between these 2 masses was 4.5 cm. the second breast mass was biopsied and was found to be benign.  She has a deleterious BRCA-2 mutation, namely 6696 deletion TC.  Status post right mastectomy and axillary lymph node dissection December 04, 2010 for a 1.5 cm invasive ductal carcinoma, grade 3, involving 3/12 axillary lymph nodes, with ample margins, T1c N1 M0, estrogen receptor 94% positive, progesterone receptor 4% positive with an MIB-1 of 62% and no HER2 amplification on the original material.    The patient is currently being treated in the adjuvant setting, the goal being to complete 4 doses of doxorubicin and cyclophosphamide, followed by 12 weekly doses of paclitaxel.  Interim History:  Patient returns today for followup of her right breast carcinoma, due  for her 6th weekly dose of paclitaxel today, given in the adjuvant setting.   [name] is feeling well, with no notable side effects other than some very mild, intermittent, tingling affecting the tips of her toes and fingers. She tells me this comes and goes, is not affecting any of her day-to-day activities, and is really no worse than it was last week. She's had no mouth ulcers or oral sensitivity her appetite is a little reduced, but she has no nausea or emesis. No change in bowel habits. Her skin is hyperpigmented as are her nails, but there is no nailbed sensitivity no additional rashes. No signs of abnormal bleeding. Her glucose has been very well-controlled since discontinuing the [facility] regimen.   A detailed review of systems is otherwise noncontributory as noted below.  Review of Systems: Constitutional:  no weight loss, fever, night sweats and feels well Eyes: negative [id number] Cardiovascular: no chest pain or dyspnea on exertion Respiratory: no cough, shortness of breath, or wheezing Neurological: occasional tingling in fingers and toes Dermatological: negative except for hyperpigmentation Gastrointestinal: no abdominal pain, nausea, change in bowel habits, or black or bloody stools Genito-Urinary: no dysuria, trouble voiding, or hematuria Hematological and Lymphatic: negative Breast: negative Musculoskeletal: negative Remaining ROS negative.  Medications: I have reviewed the patient's current medications.  Current Outpatient Prescriptions  Medication Sig Dispense Refill  . aspirin 81 MG tablet Take 81 mg by mouth daily.        . fluticasone (FLONASE) 50 MCG/ACT nasal spray Place 2 sprays into the nose daily.  16 g  2  . glipiZIDE (  GLUCOTROL) 10 MG tablet Take 1 tablet (10 mg total) by mouth 2 (two) times daily before a meal.  90 tablet  3  . hydrochlorothiazide 25 MG tablet Take 25 mg by mouth daily.        Marland Kitchen lidocaine-prilocaine (EMLA) cream Apply 1 application topically  as needed. Prior to port access       . lisinopril (PRINIVIL,ZESTRIL) 40 MG tablet Take 1 tablet (40 mg total) by mouth daily.  30 tablet  11  . metFORMIN (GLUCOPHAGE) 500 MG tablet Take 1 tablet (500 mg total) by mouth 2 (two) times daily with a meal.  90 tablet  3  . oxyCODONE-acetaminophen (PERCOCET) 5-325 MG per tablet Take 1 tablet by mouth every 6 (six) hours as needed. For pain       . polyvinyl alcohol-povidone (REFRESH) 1.4-0.6 % ophthalmic solution Apply 1 drop to eye as needed. Diabetic eyes/dry eyes       . pravastatin (PRAVACHOL) 40 MG tablet Take 1 tablet (40 mg total) by mouth at bedtime.  90 tablet  5  . prochlorperazine (COMPAZINE) 10 MG tablet Take 10 mg by mouth 4 (four) times daily -  before meals and at bedtime. For nausea       . tobramycin-dexamethasone (TOBRADEX) ophthalmic solution 1 drop in each eye 1-2 times daily while receiving chemo  5 mL  2   Family History: patient's mother alive age 66; father deceased, cause unknown. The patient has 8 sisters and 9 brothers.  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  Physical Exam: Filed Vitals:   05/14/11 1339  BP: 140/82  Pulse: 99  Temp: 98.3 F (36.8 C)   HEENT:  Sclerae anicteric. Oropharynx clear.  No mucositis or candidiasis.   Nodes:  No cervical, supraclavicular, or axillary lymphadenopathy palpated.  Breast Exam:  Deferred Lungs:  Clear to auscultation bilaterally.  No crackles, rhonchi, or wheezes.   Heart:  Regular rate and rhythm.  No gallops, murmurs, or rubs. Abdomen:  Soft, nontender.  Positive bowel sounds.   Extremities:  Benign.  No peripheral edema or cyanosis.   Skin:  Hyperpigmented skin and nailbeds bilaterally on the upper  extremities..  Otherwise benign.   Neuro:  Nonfocal.   Lab Results:  CMP      Component Value Date/Time   NA 143 05/07/2011 1000   K 3.7 05/07/2011 1000   CL 105 05/07/2011 1000   CO2 30 05/07/2011 1000   GLUCOSE 81 05/07/2011 1000   BUN 13 05/07/2011 1000   CREATININE 0.71 05/07/2011 1000   CALCIUM 9.3 05/07/2011 1000   PROT 6.6 05/07/2011 1000   ALBUMIN 4.1 05/07/2011 1000   AST 16 05/07/2011 1000   ALT 22 05/07/2011 1000   ALKPHOS 48 05/07/2011 1000   BILITOT 0.3 05/07/2011 1000   GFRNONAA >90 03/06/2011 1033   GFRAA >90 03/06/2011 1033    CBC Lab Results  Component Value Date   WBC 5.7 05/14/2011   HGB 9.2* 05/14/2011   HCT 28.6* 05/14/2011   MCV 95.0 05/14/2011   PLT 349 05/14/2011    Studies/Results:  Dg Chest 2 View  03/13/2011  *RADIOLOGY REPORT*  Clinical Data: Evaluate Port-A-Cath.  CHEST - 2 VIEW  Comparison: Chest x-ray 03/07/2011.  Findings:  The Port-A-Cath appears stable in position and appearance.  Very slight kinking of the catheter near the inferior margin of the clavicle.  The cardiac silhouette, mediastinal and hilar contours are stable.  Stable surgical changes from a right mastectomy.  No acute pulmonary findings.  IMPRESSION: Stable appearance of the left-sided Port-A-Cath.  Very slight kinking of the catheter near the inferior margin of the clavicle.  Original Report Authenticated By: P. Loralie Champagne, M.D.    Assessment:  66 year old BRCA-2 positive [name] woman, status post right mastectomy and axillary lymph node dissection August of 2012 for a T1c N1 (Stage II) invasive ductal carcinoma, grade 3, estrogen and progesterone receptor positive, HER-2 negative, with an MIB-1-1 of 62%,  1.  Status post 4 cycles of doxorubicin and cyclophosphamide given in dose dense fashion, currently receiving weekly paclitaxel  Plan:  Patient will receive her 6th weekly dose of paclitaxel today as planned. She continues to tolerate treatment well, but we will continue to follow her on a weekly  basis. We will follow her closely for any increased signs of peripheral neuropathy, especially in light of her known diabetes.  [name] is scheduled out through her 12th planned dose of paclitaxel on February 19. When she completes chemotherapy she will need to have her remaining ovary removed. She will need yearly MRIs in addition to mammography. We'll also have to discuss testing her family members.  The patient voices understanding and agreement with our plan. She knows to call with any changes or problems prior to her next scheduled appointment.  Clarence [place], PA-C 05/14/2011

## 2011-05-14 NOTE — Telephone Encounter (Signed)
Gv pts schedule for jan-feb2013 to the tx nurse to provide to the pt

## 2011-05-20 ENCOUNTER — Other Ambulatory Visit (HOSPITAL_BASED_OUTPATIENT_CLINIC_OR_DEPARTMENT_OTHER): Payer: Self-pay | Admitting: Lab

## 2011-05-20 ENCOUNTER — Ambulatory Visit (HOSPITAL_BASED_OUTPATIENT_CLINIC_OR_DEPARTMENT_OTHER): Payer: Self-pay | Admitting: Physician Assistant

## 2011-05-20 ENCOUNTER — Ambulatory Visit (HOSPITAL_BASED_OUTPATIENT_CLINIC_OR_DEPARTMENT_OTHER): Payer: Self-pay

## 2011-05-20 ENCOUNTER — Encounter: Payer: Self-pay | Admitting: Physician Assistant

## 2011-05-20 VITALS — BP 127/79 | HR 91 | Temp 98.5°F | Ht 58.5 in | Wt 153.0 lb

## 2011-05-20 DIAGNOSIS — C50319 Malignant neoplasm of lower-inner quadrant of unspecified female breast: Secondary | ICD-10-CM

## 2011-05-20 DIAGNOSIS — C50919 Malignant neoplasm of unspecified site of unspecified female breast: Secondary | ICD-10-CM

## 2011-05-20 DIAGNOSIS — Z5111 Encounter for antineoplastic chemotherapy: Secondary | ICD-10-CM

## 2011-05-20 DIAGNOSIS — C773 Secondary and unspecified malignant neoplasm of axilla and upper limb lymph nodes: Secondary | ICD-10-CM

## 2011-05-20 DIAGNOSIS — Z9010 Acquired absence of unspecified breast and nipple: Secondary | ICD-10-CM

## 2011-05-20 LAB — CBC WITH DIFFERENTIAL/PLATELET
BASO%: 0.5 % (ref 0.0–2.0)
Basophils Absolute: 0 10*3/uL (ref 0.0–0.1)
EOS%: 0.7 % (ref 0.0–7.0)
Eosinophils Absolute: 0 10*3/uL (ref 0.0–0.5)
HCT: 29.3 % — ABNORMAL LOW (ref 34.8–46.6)
HGB: 9.5 g/dL — ABNORMAL LOW (ref 11.6–15.9)
LYMPH%: 33.3 % (ref 14.0–49.7)
MCH: 31.4 pg (ref 25.1–34.0)
MCHC: 32.4 g/dL (ref 31.5–36.0)
MCV: 96.7 fL (ref 79.5–101.0)
MONO#: 0.2 10*3/uL (ref 0.1–0.9)
MONO%: 4.1 % (ref 0.0–14.0)
NEUT#: 2.7 10*3/uL (ref 1.5–6.5)
NEUT%: 61.4 % (ref 38.4–76.8)
Platelets: 298 10*3/uL (ref 145–400)
RBC: 3.03 10*6/uL — ABNORMAL LOW (ref 3.70–5.45)
RDW: 19.2 % — ABNORMAL HIGH (ref 11.2–14.5)
WBC: 4.4 10*3/uL (ref 3.9–10.3)
lymph#: 1.5 10*3/uL (ref 0.9–3.3)
nRBC: 0 % (ref 0–0)

## 2011-05-20 MED ORDER — FAMOTIDINE IN NACL 20-0.9 MG/50ML-% IV SOLN
20.0000 mg | Freq: Once | INTRAVENOUS | Status: AC
  Administered 2011-05-20: 20 mg via INTRAVENOUS

## 2011-05-20 MED ORDER — PACLITAXEL CHEMO INJECTION 300 MG/50ML
80.0000 mg/m2 | Freq: Once | INTRAVENOUS | Status: AC
  Administered 2011-05-20: 138 mg via INTRAVENOUS
  Filled 2011-05-20: qty 23

## 2011-05-20 MED ORDER — ONDANSETRON 8 MG/50ML IVPB (CHCC)
8.0000 mg | Freq: Once | INTRAVENOUS | Status: AC
  Administered 2011-05-20: 8 mg via INTRAVENOUS

## 2011-05-20 MED ORDER — DEXAMETHASONE SODIUM PHOSPHATE 4 MG/ML IJ SOLN
4.0000 mg | Freq: Once | INTRAMUSCULAR | Status: AC
  Administered 2011-05-20: 4 mg via INTRAVENOUS

## 2011-05-20 MED ORDER — DIPHENHYDRAMINE HCL 25 MG PO TABS
25.0000 mg | ORAL_TABLET | Freq: Once | ORAL | Status: AC
  Administered 2011-05-20: 25 mg via ORAL
  Filled 2011-05-20: qty 1

## 2011-05-20 MED ORDER — SODIUM CHLORIDE 0.9 % IV SOLN
Freq: Once | INTRAVENOUS | Status: AC
  Administered 2011-05-20: 10:00:00 via INTRAVENOUS

## 2011-05-20 NOTE — Progress Notes (Signed)
ID: [name]  HPI: [name] is a 66 year old [place] woman referred by Dr. Luisa Hart for evaluation and treatment in the setting of newly diagnosed breast cancer.     Ms. Severtson had screening mammography Sep 11, 2010, at [facility].  This showed a possible right axillary mass and she was brought back for additional views Sep 20, 2010.  An ultrasound showed abnormal enlarged lymph nodes in the right axilla measuring a maximum of 2.2 cm.  Biopsy was suggested and performed on Sep 25, 2010.  The pathology from that procedure ([id number]) showed metastatic adenocarcinoma consistent with a breast primary.  In particular, the tumor was estrogen and progesterone receptor positive, HER2/neu was negative, the MIB-1 was 40%.   With this information, the patient was referred for bilateral breast MRIs performed May 29.  This showed a complex picture, there being in addition to 2 pathologically enlarged lymph nodes in the right axilla and then other mildly prominent lymph nodes, 2 masses in the breast itself, one was subareolar and measured 1.3 cm, the second mass was more posterior and inferior and measured 8 mm.  The distance between these 2 masses was 4.5 cm. the second breast mass was biopsied and was found to be benign.  She has a deleterious BRCA-2 mutation, namely 6696 deletion TC.  Status post right mastectomy and axillary lymph node dissection December 04, 2010 for a 1.5 cm invasive ductal carcinoma, grade 3, involving 3/12 axillary lymph nodes, with ample margins, T1c N1 M0, estrogen receptor 94% positive, progesterone receptor 4% positive with an MIB-1 of 62% and no HER2 amplification on the original material.    The patient is currently being treated in the adjuvant setting, the goal being to complete 4 doses of doxorubicin and cyclophosphamide, followed by 12 weekly doses of paclitaxel.  Interim History:  Patient returns today for followup of her right breast carcinoma, due  for her 7th weekly dose of paclitaxel today, given in the adjuvant setting.   [name] is feeling well, with no new complaints. She has some intermittent tingling in the tips of her fingers and tips of her toes, nothing that is affecting her day-to-day activities. This has not changed or worsened since her last treatment here, and continues to be mild. She does have some hyperpigmentation in the skin and nailbeds bilaterally on the upper chest remedies, but denies any nailbed sensitivity. No additional rashes or skin changes.  Otherwise, [name] has had no fevers, chills, or night sweats. She denies nausea or change in bowel habits. Her blood sugars have been well-controlled. Her energy level is fair.    A detailed review of systems is otherwise noncontributory as noted below.  Review of Systems: Constitutional:  no weight loss, fever, night sweats and feels well Eyes: negative [id number] Cardiovascular: no chest pain or dyspnea on exertion Respiratory: no cough, shortness of breath, or wheezing Neurological: occasional tingling in fingers and toes Dermatological: negative except for hyperpigmentation Gastrointestinal: no abdominal pain, nausea, change in bowel habits, or black or bloody stools Genito-Urinary: no dysuria, trouble voiding, or hematuria Hematological and Lymphatic: negative Breast: negative Musculoskeletal: negative Remaining ROS negative.  Medications: I have reviewed the patient's current medications.  Current Outpatient Prescriptions  Medication Sig Dispense Refill  . aspirin 81 MG tablet Take 81 mg by mouth daily.        . fluticasone (FLONASE) 50 MCG/ACT nasal spray Place 2 sprays into the nose daily.  16 g  2  . glipiZIDE (GLUCOTROL) 10 MG  tablet Take 1 tablet (10 mg total) by mouth 2 (two) times daily before a meal.  90 tablet  3  . hydrochlorothiazide 25 MG tablet Take 25 mg by mouth daily.        Marland Kitchen lidocaine-prilocaine (EMLA) cream Apply 1 application  topically as needed. Prior to port access       . lisinopril (PRINIVIL,ZESTRIL) 40 MG tablet Take 1 tablet (40 mg total) by mouth daily.  30 tablet  11  . metFORMIN (GLUCOPHAGE) 500 MG tablet Take 1 tablet (500 mg total) by mouth 2 (two) times daily with a meal.  90 tablet  3  . oxyCODONE-acetaminophen (PERCOCET) 5-325 MG per tablet Take 1 tablet by mouth every 6 (six) hours as needed. For pain       . polyvinyl alcohol-povidone (REFRESH) 1.4-0.6 % ophthalmic solution Apply 1 drop to eye as needed. Diabetic eyes/dry eyes       . pravastatin (PRAVACHOL) 40 MG tablet Take 1 tablet (40 mg total) by mouth at bedtime.  90 tablet  5  . prochlorperazine (COMPAZINE) 10 MG tablet Take 10 mg by mouth 4 (four) times daily -  before meals and at bedtime. For nausea       . tobramycin-dexamethasone (TOBRADEX) ophthalmic solution 1 drop in each eye 1-2 times daily while receiving chemo  5 mL  2   No current facility-administered medications for this visit.   Facility-Administered Medications Ordered in Other Visits  Medication Dose Route Frequency Provider Last Rate Last Dose  . 0.9 %  sodium chloride infusion   Intravenous Once Zollie Scale, PA 20 mL/hr at 05/20/11 0959    . dexamethasone (DECADRON) injection 4 mg  4 mg Intravenous Once Tarica Harl, PA   4 mg at 05/20/11 1000  . diphenhydrAMINE (BENADRYL) tablet 25 mg  25 mg Oral Once Zollie Scale, PA   25 mg at 05/20/11 0959  . famotidine (PEPCID) IVPB 20 mg  20 mg Intravenous Once Zollie Scale, PA   20 mg at 05/20/11 1015  . ondansetron (ZOFRAN) IVPB 8 mg  8 mg Intravenous Once Zollie Scale, PA   8 mg at 05/20/11 0959  . PACLitaxel (TAXOL) 138 mg in dextrose 5 % 250 mL chemo infusion (</= 80mg /m2)  80 mg/m2 (Treatment Plan Actual) Intravenous Once Zollie Scale, PA       Family History: patient's mother alive age 50; father deceased, cause unknown. The patient has 8 sisters and 9 brothers.  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  Physical Exam: Filed Vitals:   05/20/11 0932  BP: 127/79  Pulse: 91  Temp: 98.5 F (36.9 C)   HEENT:  Sclerae anicteric. Oropharynx clear.  No mucositis or candidiasis.   Nodes:  No cervical, supraclavicular, or axillary lymphadenopathy palpated.  Breast Exam:  Deferred Lungs:  Clear to auscultation bilaterally.  No crackles, rhonchi, or wheezes.   Heart:  Regular rate and rhythm.  No gallops, murmurs, or rubs. Abdomen:  Soft, nontender.  Positive bowel sounds.  No organomegaly or masses palpated. Extremities:  Benign.  No peripheral edema or cyanosis.   Skin:  Hyperpigmented skin and nailbeds bilaterally on the upper extremities..  Otherwise benign.   Neuro:  Nonfocal.   Lab Results:  CMP  Component Value Date/Time   NA 139 05/14/2011 1325   K 3.4* 05/14/2011 1325   CL 101 05/14/2011 1325   CO2 29 05/14/2011 1325   GLUCOSE 85 05/14/2011 1325   BUN 14 05/14/2011 1325   CREATININE 0.68 05/14/2011 1325   CALCIUM 9.6 05/14/2011 1325   PROT 7.1 05/14/2011 1325   ALBUMIN 4.2 05/14/2011 1325   AST 13 05/14/2011 1325   ALT 16 05/14/2011 1325   ALKPHOS 55 05/14/2011 1325   BILITOT 0.3 05/14/2011 1325   GFRNONAA >90 03/06/2011 1033   GFRAA >90 03/06/2011 1033    CBC Lab Results  Component Value Date   WBC 4.4 05/20/2011   HGB 9.5* 05/20/2011   HCT 29.3* 05/20/2011   MCV 96.7 05/20/2011   PLT 298 05/20/2011    Studies/Results:  Dg Chest 2 View  03/13/2011  *RADIOLOGY REPORT*  Clinical Data: Evaluate Port-A-Cath.  CHEST - 2 VIEW  Comparison: Chest x-ray 03/07/2011.  Findings: The Port-A-Cath appears stable in position and appearance.  Very slight kinking of the catheter near the inferior margin of the clavicle.  The cardiac silhouette, mediastinal and hilar contours  are stable.  Stable surgical changes from a right mastectomy.  No acute pulmonary findings.  IMPRESSION: Stable appearance of the left-sided Port-A-Cath.  Very slight kinking of the catheter near the inferior margin of the clavicle.  Original Report Authenticated By: P. Loralie Champagne, M.D.    Assessment:  67 year old BRCA-2 positive [name] woman, status post right mastectomy and axillary lymph node dissection August of 2012 for a T1c N1 (Stage II) invasive ductal carcinoma, grade 3, estrogen and progesterone receptor positive, HER-2 negative, with an MIB-1-1 of 62%,  1.  Status post 4 cycles of doxorubicin and cyclophosphamide given in dose dense fashion, currently receiving weekly paclitaxel  Plan:  Patient will receive her 7th weekly dose of paclitaxel today as planned. She continues to tolerate treatment well,but we will follow her closely for any increased signs of peripheral neuropathy, especially in light of her known diabetes.  Kemonie is scheduled out through her 12th planned dose of paclitaxel on February 19. When she completes chemotherapy she will need to have her remaining ovary removed. She will need yearly MRIs in addition to mammography. We'll also have to discuss testing her family members.  The patient voices understanding and agreement with our plan. She knows to call with any changes or problems prior to her next scheduled appointment.  Raphel Stickles, PA-C 05/20/2011

## 2011-05-23 ENCOUNTER — Other Ambulatory Visit: Payer: Self-pay | Admitting: Certified Registered Nurse Anesthetist

## 2011-05-27 ENCOUNTER — Other Ambulatory Visit: Payer: Self-pay | Admitting: Lab

## 2011-05-27 ENCOUNTER — Encounter: Payer: Self-pay | Admitting: Physician Assistant

## 2011-05-27 ENCOUNTER — Ambulatory Visit (HOSPITAL_BASED_OUTPATIENT_CLINIC_OR_DEPARTMENT_OTHER): Payer: Self-pay

## 2011-05-27 ENCOUNTER — Ambulatory Visit (HOSPITAL_BASED_OUTPATIENT_CLINIC_OR_DEPARTMENT_OTHER): Payer: Self-pay | Admitting: Physician Assistant

## 2011-05-27 VITALS — BP 149/83 | HR 93 | Temp 98.4°F | Ht 58.5 in | Wt 157.7 lb

## 2011-05-27 DIAGNOSIS — C50919 Malignant neoplasm of unspecified site of unspecified female breast: Secondary | ICD-10-CM

## 2011-05-27 DIAGNOSIS — C773 Secondary and unspecified malignant neoplasm of axilla and upper limb lymph nodes: Secondary | ICD-10-CM

## 2011-05-27 DIAGNOSIS — Z5111 Encounter for antineoplastic chemotherapy: Secondary | ICD-10-CM

## 2011-05-27 LAB — CBC WITH DIFFERENTIAL/PLATELET
BASO%: 0.5 % (ref 0.0–2.0)
Basophils Absolute: 0 10*3/uL (ref 0.0–0.1)
EOS%: 1.3 % (ref 0.0–7.0)
Eosinophils Absolute: 0.1 10*3/uL (ref 0.0–0.5)
HCT: 27.8 % — ABNORMAL LOW (ref 34.8–46.6)
HGB: 9.1 g/dL — ABNORMAL LOW (ref 11.6–15.9)
LYMPH%: 35.5 % (ref 14.0–49.7)
MCH: 31.5 pg (ref 25.1–34.0)
MCHC: 32.7 g/dL (ref 31.5–36.0)
MCV: 96.2 fL (ref 79.5–101.0)
MONO#: 0.3 10*3/uL (ref 0.1–0.9)
MONO%: 6.9 % (ref 0.0–14.0)
NEUT#: 2.1 10*3/uL (ref 1.5–6.5)
NEUT%: 55.8 % (ref 38.4–76.8)
Platelets: 216 10*3/uL (ref 145–400)
RBC: 2.89 10*6/uL — ABNORMAL LOW (ref 3.70–5.45)
RDW: 19.2 % — ABNORMAL HIGH (ref 11.2–14.5)
WBC: 3.8 10*3/uL — ABNORMAL LOW (ref 3.9–10.3)
lymph#: 1.3 10*3/uL (ref 0.9–3.3)
nRBC: 1 % — ABNORMAL HIGH (ref 0–0)

## 2011-05-27 LAB — COMPREHENSIVE METABOLIC PANEL
ALT: 19 U/L (ref 0–35)
AST: 14 U/L (ref 0–37)
Albumin: 4.1 g/dL (ref 3.5–5.2)
Alkaline Phosphatase: 47 U/L (ref 39–117)
BUN: 11 mg/dL (ref 6–23)
CO2: 28 mEq/L (ref 19–32)
Calcium: 9.5 mg/dL (ref 8.4–10.5)
Chloride: 104 mEq/L (ref 96–112)
Creatinine, Ser: 0.59 mg/dL (ref 0.50–1.10)
Glucose, Bld: 78 mg/dL (ref 70–99)
Potassium: 3.6 mEq/L (ref 3.5–5.3)
Sodium: 140 mEq/L (ref 135–145)
Total Bilirubin: 0.2 mg/dL — ABNORMAL LOW (ref 0.3–1.2)
Total Protein: 6.7 g/dL (ref 6.0–8.3)

## 2011-05-27 MED ORDER — FAMOTIDINE IN NACL 20-0.9 MG/50ML-% IV SOLN
20.0000 mg | Freq: Once | INTRAVENOUS | Status: AC
  Administered 2011-05-27: 20 mg via INTRAVENOUS

## 2011-05-27 MED ORDER — HEPARIN SOD (PORK) LOCK FLUSH 100 UNIT/ML IV SOLN
500.0000 [IU] | Freq: Once | INTRAVENOUS | Status: AC | PRN
  Administered 2011-05-27: 500 [IU]
  Filled 2011-05-27: qty 5

## 2011-05-27 MED ORDER — SODIUM CHLORIDE 0.9 % IJ SOLN
10.0000 mL | INTRAMUSCULAR | Status: DC | PRN
  Administered 2011-05-27: 10 mL
  Filled 2011-05-27: qty 10

## 2011-05-27 MED ORDER — PACLITAXEL CHEMO INJECTION 300 MG/50ML
80.0000 mg/m2 | Freq: Once | INTRAVENOUS | Status: AC
  Administered 2011-05-27: 138 mg via INTRAVENOUS
  Filled 2011-05-27: qty 23

## 2011-05-27 MED ORDER — ONDANSETRON 8 MG/50ML IVPB (CHCC)
8.0000 mg | Freq: Once | INTRAVENOUS | Status: AC
  Administered 2011-05-27: 8 mg via INTRAVENOUS

## 2011-05-27 MED ORDER — DEXAMETHASONE SODIUM PHOSPHATE 4 MG/ML IJ SOLN
4.0000 mg | Freq: Once | INTRAMUSCULAR | Status: AC
  Administered 2011-05-27: 4 mg via INTRAVENOUS

## 2011-05-27 MED ORDER — DIPHENHYDRAMINE HCL 25 MG PO TABS
25.0000 mg | ORAL_TABLET | Freq: Once | ORAL | Status: AC
  Administered 2011-05-27: 25 mg via ORAL
  Filled 2011-05-27: qty 1

## 2011-05-27 MED ORDER — SODIUM CHLORIDE 0.9 % IV SOLN
Freq: Once | INTRAVENOUS | Status: AC
  Administered 2011-05-27: 11:00:00 via INTRAVENOUS

## 2011-05-27 NOTE — Patient Instructions (Signed)
Pt tolerated chemo well, denies questions or concerns, d/c ambulatory!!

## 2011-05-27 NOTE — Progress Notes (Signed)
ID: [name]  HPI: [name] is a 66 year old [place] woman referred by Dr. Luisa Ryan for evaluation and treatment in the setting of newly diagnosed breast cancer.     [name] had screening mammography Sep 11, 2010, at [facility].  This showed a possible right axillary mass and she was brought back for additional views Sep 20, 2010.  An ultrasound showed abnormal enlarged lymph nodes in the right axilla measuring a maximum of 2.2 cm.  Biopsy was suggested and performed on Sep 25, 2010.  The pathology from that procedure ([id number]) showed metastatic adenocarcinoma consistent with a breast primary.  In particular, the tumor was estrogen and progesterone receptor positive, HER2/neu was negative, the MIB-1 was 40%.   With this information, the patient was referred for bilateral breast MRIs performed May 29.  This showed a complex picture, there being in addition to 2 pathologically enlarged lymph nodes in the right axilla and then other mildly prominent lymph nodes, 2 masses in the breast itself, one was subareolar and measured 1.3 cm, the second mass was more posterior and inferior and measured 8 mm.  The distance between these 2 masses was 4.5 cm. the second breast mass was biopsied and was found to be benign.  She has a deleterious BRCA-2 mutation, namely 6696 deletion TC.  Status post right mastectomy and axillary lymph node dissection December 04, 2010 for a 1.5 cm invasive ductal carcinoma, grade 3, involving 3/12 axillary lymph nodes, with ample margins, T1c N1 M0, estrogen receptor 94% positive, progesterone receptor 4% positive with an MIB-1 of 62% and no HER2 amplification on the original material.    The patient is currently being treated in the adjuvant setting, the goal being to complete 4 doses of doxorubicin and cyclophosphamide, followed by 12 weekly doses of paclitaxel.  Interim History:  Patient returns today for followup of her right breast carcinoma, due  for her 8th weekly dose of paclitaxel today, given in the adjuvant setting.   Continues to tolerate treatment extremely well. She continues to have tingling "on and off" in her fingers and toes, but notes that this has not changed at all since her last treatment. It is not affecting any of her day-to-day activities. She continues to have some nailbed changes, but denies no bed sensitivity. No additional skin changes noted. No signs of abnormal bleeding. No mouth ulcers or oral sensitivity. No excessive tearing. She also denies any fevers, chills, or night sweats. Her energy level is "normal".  A detailed review of systems is otherwise noncontributory as noted below.  Review of Systems: Constitutional:  no weight loss, fever, night sweats and feels well Eyes: negative [id number] Cardiovascular: no chest pain or dyspnea on exertion Respiratory: no cough, shortness of breath, or wheezing Neurological: occasional tingling in fingers and toes Dermatological: negative except for hyperpigmentation Gastrointestinal: no abdominal pain, nausea, change in bowel habits, or black or bloody stools Genito-Urinary: no dysuria, trouble voiding, or hematuria Hematological and Lymphatic: negative Breast: negative Musculoskeletal: negative Remaining ROS negative.  Medications: I have reviewed the patient's current medications.  Current Outpatient Prescriptions  Medication Sig Dispense Refill  . aspirin 81 MG tablet Take 81 mg by mouth daily.        . fluticasone (FLONASE) 50 MCG/ACT nasal spray Place 2 sprays into the nose daily.  16 g  2  . glipiZIDE (GLUCOTROL) 10 MG tablet Take 1 tablet (10 mg total) by mouth 2 (two) times daily before a meal.  90 tablet  3  . hydrochlorothiazide 25 MG tablet Take 25 mg by mouth daily.        Marland Kitchen lidocaine-prilocaine (EMLA) cream Apply 1 application topically as needed. Prior to port access       . lisinopril (PRINIVIL,ZESTRIL) 40 MG tablet Take 1 tablet (40 mg total)  by mouth daily.  30 tablet  11  . metFORMIN (GLUCOPHAGE) 500 MG tablet Take 1 tablet (500 mg total) by mouth 2 (two) times daily with a meal.  90 tablet  3  . oxyCODONE-acetaminophen (PERCOCET) 5-325 MG per tablet Take 1 tablet by mouth every 6 (six) hours as needed. For pain       . polyvinyl alcohol-povidone (REFRESH) 1.4-0.6 % ophthalmic solution Apply 1 drop to eye as needed. Diabetic eyes/dry eyes       . pravastatin (PRAVACHOL) 40 MG tablet Take 1 tablet (40 mg total) by mouth at bedtime.  90 tablet  5  . prochlorperazine (COMPAZINE) 10 MG tablet Take 10 mg by mouth 4 (four) times daily -  before meals and at bedtime. For nausea       . tobramycin-dexamethasone (TOBRADEX) ophthalmic solution 1 drop in each eye 1-2 times daily while receiving chemo  5 mL  2   Family History: patient's mother alive age 58; father deceased, cause unknown. The patient has 8 sisters and 9 brothers.  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  Physical Exam: Filed Vitals:   05/27/11 1000  BP: 149/83  Pulse: 93  Temp: 98.4 F (36.9 C)   HEENT:  Sclerae anicteric. Oropharynx clear.  No mucositis or candidiasis.   Nodes:  No cervical, supraclavicular, or axillary lymphadenopathy palpated.  Breast Exam:  Deferred Lungs:  Clear to auscultation bilaterally.  No crackles, rhonchi, or wheezes.   Heart:  Regular rate and rhythm.  No gallops, murmurs, or rubs. Abdomen:  Soft, nontender.  Positive bowel sounds.  No organomegaly or masses palpated. Extremities:  Benign.  No peripheral edema or cyanosis.   Skin:  Hyperpigmented skin and nailbeds bilaterally on the upper extremities..  Otherwise benign.   Neuro:  Nonfocal.   Lab  Results:  CMP      Component Value Date/Time   NA 139 05/14/2011 1325   K 3.4* 05/14/2011 1325   CL 101 05/14/2011 1325   CO2 29 05/14/2011 1325   GLUCOSE 85 05/14/2011 1325   BUN 14 05/14/2011 1325   CREATININE 0.68 05/14/2011 1325   CALCIUM 9.6 05/14/2011 1325   PROT 7.1 05/14/2011 1325   ALBUMIN 4.2 05/14/2011 1325   AST 13 05/14/2011 1325   ALT 16 05/14/2011 1325   ALKPHOS 55 05/14/2011 1325   BILITOT 0.3 05/14/2011 1325   GFRNONAA >90 03/06/2011 1033   GFRAA >90 03/06/2011 1033    CBC Lab Results  Component Value Date   WBC 3.8* 05/27/2011   HGB 9.1* 05/27/2011   HCT 27.8* 05/27/2011   MCV 96.2 05/27/2011   PLT 216 05/27/2011    Studies/Results:  Dg Chest 2 View  03/13/2011  *RADIOLOGY REPORT*  Clinical Data: Evaluate Port-A-Cath.  CHEST - 2 VIEW  Comparison: Chest x-ray 03/07/2011.  Findings: The Port-A-Cath appears stable in position and appearance.  Very slight kinking of the catheter near the inferior margin  of the clavicle.  The cardiac silhouette, mediastinal and hilar contours are stable.  Stable surgical changes from a right mastectomy.  No acute pulmonary findings.  IMPRESSION: Stable appearance of the left-sided Port-A-Cath.  Very slight kinking of the catheter near the inferior margin of the clavicle.  Original Report Authenticated By: P. Loralie Champagne, M.D.    Assessment:  66 year old BRCA-2 positive [name] woman, status post right mastectomy and axillary lymph node dissection August of 2012 for a T1c N1 (Stage II) invasive ductal carcinoma, grade 3, estrogen and progesterone receptor positive, HER-2 negative, with an MIB-1-1 of 62%,  1.  Status post 4 cycles of doxorubicin and cyclophosphamide given in dose dense fashion, currently receiving weekly paclitaxel  Plan:  Patient will receive her 8th weekly dose of paclitaxel today as planned. She continues to tolerate treatment well,but we will follow her closely for any increased signs of peripheral neuropathy, especially in light of  her known diabetes. She will see Dr. Darnelle Catalan next week for routine followup prior to her ninth dose of paclitaxel.  Medea is scheduled out through her 12th planned dose of paclitaxel on February 19. When she completes chemotherapy she will need to have her remaining ovary removed. She will need yearly MRIs in addition to mammography. We'll also have to discuss testing her family members.  The patient voices understanding and agreement with our plan. She knows to call with any changes or problems prior to her next scheduled appointment.  Zollie Scale, PA-C 05/27/2011

## 2011-06-03 ENCOUNTER — Ambulatory Visit (HOSPITAL_BASED_OUTPATIENT_CLINIC_OR_DEPARTMENT_OTHER): Payer: Self-pay | Admitting: Oncology

## 2011-06-03 ENCOUNTER — Other Ambulatory Visit (HOSPITAL_BASED_OUTPATIENT_CLINIC_OR_DEPARTMENT_OTHER): Payer: Self-pay | Admitting: Lab

## 2011-06-03 ENCOUNTER — Ambulatory Visit (HOSPITAL_BASED_OUTPATIENT_CLINIC_OR_DEPARTMENT_OTHER): Payer: Self-pay

## 2011-06-03 VITALS — BP 151/80 | HR 103 | Temp 98.4°F | Ht 58.5 in | Wt 156.7 lb

## 2011-06-03 DIAGNOSIS — C50919 Malignant neoplasm of unspecified site of unspecified female breast: Secondary | ICD-10-CM

## 2011-06-03 DIAGNOSIS — Z170 Estrogen receptor positive status [ER+]: Secondary | ICD-10-CM

## 2011-06-03 DIAGNOSIS — C773 Secondary and unspecified malignant neoplasm of axilla and upper limb lymph nodes: Secondary | ICD-10-CM

## 2011-06-03 DIAGNOSIS — C50319 Malignant neoplasm of lower-inner quadrant of unspecified female breast: Secondary | ICD-10-CM

## 2011-06-03 DIAGNOSIS — Z1501 Genetic susceptibility to malignant neoplasm of breast: Secondary | ICD-10-CM

## 2011-06-03 DIAGNOSIS — Z5111 Encounter for antineoplastic chemotherapy: Secondary | ICD-10-CM

## 2011-06-03 LAB — CBC WITH DIFFERENTIAL/PLATELET
BASO%: 0.9 % (ref 0.0–2.0)
Basophils Absolute: 0 10*3/uL (ref 0.0–0.1)
EOS%: 1.7 % (ref 0.0–7.0)
Eosinophils Absolute: 0.1 10*3/uL (ref 0.0–0.5)
HCT: 29 % — ABNORMAL LOW (ref 34.8–46.6)
HGB: 9.5 g/dL — ABNORMAL LOW (ref 11.6–15.9)
LYMPH%: 32.4 % (ref 14.0–49.7)
MCH: 31.7 pg (ref 25.1–34.0)
MCHC: 32.8 g/dL (ref 31.5–36.0)
MCV: 96.7 fL (ref 79.5–101.0)
MONO#: 0.3 10*3/uL (ref 0.1–0.9)
MONO%: 7.7 % (ref 0.0–14.0)
NEUT#: 2 10*3/uL (ref 1.5–6.5)
NEUT%: 57.3 % (ref 38.4–76.8)
Platelets: 273 10*3/uL (ref 145–400)
RBC: 3 10*6/uL — ABNORMAL LOW (ref 3.70–5.45)
RDW: 18.9 % — ABNORMAL HIGH (ref 11.2–14.5)
WBC: 3.5 10*3/uL — ABNORMAL LOW (ref 3.9–10.3)
lymph#: 1.1 10*3/uL (ref 0.9–3.3)
nRBC: 0 % (ref 0–0)

## 2011-06-03 MED ORDER — PACLITAXEL CHEMO INJECTION 300 MG/50ML
80.0000 mg/m2 | Freq: Once | INTRAVENOUS | Status: AC
  Administered 2011-06-03: 138 mg via INTRAVENOUS
  Filled 2011-06-03: qty 23

## 2011-06-03 MED ORDER — ONDANSETRON 8 MG/50ML IVPB (CHCC)
8.0000 mg | Freq: Once | INTRAVENOUS | Status: AC
  Administered 2011-06-03: 8 mg via INTRAVENOUS

## 2011-06-03 MED ORDER — DIPHENHYDRAMINE HCL 25 MG PO TABS
25.0000 mg | ORAL_TABLET | Freq: Once | ORAL | Status: AC
  Administered 2011-06-03: 25 mg via ORAL
  Filled 2011-06-03: qty 1

## 2011-06-03 MED ORDER — FAMOTIDINE IN NACL 20-0.9 MG/50ML-% IV SOLN
20.0000 mg | Freq: Once | INTRAVENOUS | Status: AC
  Administered 2011-06-03: 20 mg via INTRAVENOUS

## 2011-06-03 MED ORDER — SODIUM CHLORIDE 0.9 % IV SOLN: Freq: Once | INTRAVENOUS | Status: DC

## 2011-06-03 MED ORDER — DEXAMETHASONE SODIUM PHOSPHATE 4 MG/ML IJ SOLN
2.0000 mg | Freq: Once | INTRAMUSCULAR | Status: AC
  Administered 2011-06-03: 2 mg via INTRAVENOUS

## 2011-06-03 NOTE — Progress Notes (Signed)
ID: [name]  HPI: [name] is a 66 year old [place] Ryan referred by Dr. Luisa Hart for evaluation and treatment in the setting of newly diagnosed breast cancer.     [name] had screening mammography Sep 11, 2010, at [facility]  This showed a possible right axillary mass and she was brought back for additional views Sep 20, 2010.  An ultrasound showed abnormal enlarged lymph nodes in the right axilla measuring a maximum of 2.2 cm.  Biopsy was suggested and performed on Sep 25, 2010.  The pathology from that procedure ([id number]) showed metastatic adenocarcinoma consistent with a breast primary.  In particular, the tumor was estrogen and progesterone receptor positive, HER2/neu was negative, the MIB-1 was 40%.   With this information, the patient was referred for bilateral breast MRIs performed May 29.  This showed a complex picture, there being in addition to 2 pathologically enlarged lymph nodes in the right axilla and then other mildly prominent lymph nodes, 2 masses in the breast itself, one was subareolar and measured 1.3 cm, the second mass was more posterior and inferior and measured 8 mm.  The distance between these 2 masses was 4.5 cm. the second breast mass was biopsied and was found to be benign.  She has a deleterious BRCA-2 mutation, namely 6696 deletion TC.  Status post right mastectomy and axillary lymph node dissection December 04, 2010 for a 1.5 cm invasive ductal carcinoma, grade 3, involving 3/12 axillary lymph nodes, with ample margins, T1c N1 M0, estrogen receptor 94% positive, progesterone receptor 4% positive with an MIB-1 of 62% and no HER2 amplification on the original material.    The patient is currently being treated in the adjuvant setting, the goal being to complete 4 doses of doxorubicin and cyclophosphamide, followed by 12 weekly doses of paclitaxel.  Interim History:  She is doing "much better" with this chemotherapy, and essentially  reports no side effects. She is doing all her activities of daily living normally.   Review of Systems: She has minimal numbness sometimes in the right foot sometimes in the fingers of the left hand, but this is very intermittent. Right now she essentially has no 2 very minimal numbness. She certainly has no burning and no impediments to her normal activities. She tells me her sugars do not rise on the day of treatment thus far she is aware. Otherwise a detailed review of systems was noncontributory.  Medications: I have reviewed the patient's current medications.  Current Outpatient Prescriptions  Medication Sig Dispense Refill  . aspirin 81 MG tablet Take 81 mg by mouth daily.        . fluticasone (FLONASE) 50 MCG/ACT nasal spray Place 2 sprays into the nose daily.  16 g  2  . glipiZIDE (GLUCOTROL) 10 MG tablet Take 1 tablet (10 mg total) by mouth 2 (two) times daily before a meal.  90 tablet  3  . hydrochlorothiazide 25 MG tablet Take 25 mg by mouth daily.        Marland Kitchen lidocaine-prilocaine (EMLA) cream Apply 1 application topically as needed. Prior to port access       . lisinopril (PRINIVIL,ZESTRIL) 40 MG tablet Take 1 tablet (40 mg total) by mouth daily.  30 tablet  11  . metFORMIN (GLUCOPHAGE) 500 MG tablet Take 1 tablet (500 mg total) by mouth 2 (two) times daily with a meal.  90 tablet  3  . oxyCODONE-acetaminophen (PERCOCET) 5-325 MG per tablet Take 1 tablet by mouth every 6 (six)  hours as needed. For pain       . polyvinyl alcohol-povidone (REFRESH) 1.4-0.6 % ophthalmic solution Apply 1 drop to eye as needed. Diabetic eyes/dry eyes       . pravastatin (PRAVACHOL) 40 MG tablet Take 1 tablet (40 mg total) by mouth at bedtime.  90 tablet  5  . prochlorperazine (COMPAZINE) 10 MG tablet Take 10 mg by mouth 4 (four) times daily -  before meals and at bedtime. For nausea       . tobramycin-dexamethasone (TOBRADEX) ophthalmic solution 1 drop in each eye 1-2 times daily while receiving chemo  5 mL   2   Family History: patient's mother alive age 60; father deceased, cause unknown. The patient has 8 sisters and 9 brothers.  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  Physical Exam: [name] who appears comfortable Filed Vitals:   06/03/11 1116  BP: 151/80  Pulse: 103  Temp: 98.4 F (36.9 C)   HEENT:  Sclerae anicteric. Oropharynx clear.  No mucositis or candidiasis.   Nodes:  No cervical, supraclavicular, or axillary lymphadenopathy palpated.  Breast Exam:  Right breast status post mastectomy, no evidence of local recurrence. Left breast no suspicious findings. Lungs:  Clear to auscultation bilaterally.  No crackles, rhonchi, or wheezes.   Heart:  Regular rate and rhythm.  No gallops, murmurs, or rubs. Abdomen:  Soft, nontender.  Positive bowel sounds.  No organomegaly or masses palpated. Extremities:  Benign.  No peripheral edema or cyanosis.   Skin:  Hyperpigmented skin and nailbeds bilaterally on the upper extremities..  Otherwise benign.   Neuro:  Nonfocal.   Lab Results:  CMP      Component Value Date/Time   NA 140 05/27/2011 0859   K 3.6 05/27/2011 0859   CL 104 05/27/2011 0859   CO2 28 05/27/2011 0859   GLUCOSE 78 05/27/2011 0859   BUN 11 05/27/2011 0859   CREATININE 0.59 05/27/2011 0859   CALCIUM 9.5 05/27/2011 0859   PROT 6.7 05/27/2011 0859   ALBUMIN 4.1 05/27/2011 0859   AST 14 05/27/2011 0859   ALT 19 05/27/2011 0859   ALKPHOS 47 05/27/2011 0859   BILITOT 0.2* 05/27/2011 0859   GFRNONAA >90 03/06/2011 1033   GFRAA >90 03/06/2011 1033    CBC Lab Results  Component Value Date   WBC 3.5* 06/03/2011   HGB 9.5* 06/03/2011   HCT 29.0* 06/03/2011   MCV 96.7 06/03/2011   PLT 273  06/03/2011    Studies/Results:  Dg Chest 2 View  03/13/2011  *RADIOLOGY REPORT*  Clinical Data: Evaluate Port-A-Cath.  CHEST - 2 VIEW  Comparison: Chest x-ray 03/07/2011.  Findings: The Port-A-Cath appears stable in position and appearance.  Very slight kinking of the catheter near the inferior margin of the clavicle.  The cardiac silhouette, mediastinal and hilar contours are stable.  Stable surgical changes from a right mastectomy.  No acute pulmonary findings.  IMPRESSION: Stable appearance of the left-sided Port-A-Cath.  Very slight kinking of the catheter near the inferior margin of the clavicle.  Original Report Authenticated By: P. Loralie Champagne, M.D.    Assessment:  66 year old BRCA-2 positive [name], status post right mastectomy and axillary lymph node dissection August of 2012 for a  T1c N1 (Stage II) invasive ductal carcinoma, grade 3, estrogen and progesterone receptor positive, HER-2 negative, with an MIB-1-1 of 62%,  1.  Status post 4 cycles of doxorubicin and cyclophosphamide given in dose dense fashion, currently receiving weekly paclitaxel  Plan: Today is day 1 cycle 9 of 12 planned weekly doses of paclitaxel. She will finish in 3 more weeks, with essentially no peripheral neuropathy so far, which is very favorable. I have dropped her dexamethasone dose to 2 mg, since she is having no side effects from the treatment. This should help prevent worsening of her diabetes. I am making her an appointment with gynecology in 4-6 weeks since she will need to have her remaining ovary removed once she completes treatment. She knows to call for any problems that may develop before the next visit.    Lowella Dell, PA-C 06/03/2011

## 2011-06-10 ENCOUNTER — Encounter: Payer: Self-pay | Admitting: Physician Assistant

## 2011-06-10 ENCOUNTER — Other Ambulatory Visit (HOSPITAL_BASED_OUTPATIENT_CLINIC_OR_DEPARTMENT_OTHER): Payer: Self-pay | Admitting: Lab

## 2011-06-10 ENCOUNTER — Ambulatory Visit (HOSPITAL_BASED_OUTPATIENT_CLINIC_OR_DEPARTMENT_OTHER): Payer: Self-pay

## 2011-06-10 ENCOUNTER — Other Ambulatory Visit: Payer: Self-pay | Admitting: Physician Assistant

## 2011-06-10 ENCOUNTER — Ambulatory Visit (HOSPITAL_BASED_OUTPATIENT_CLINIC_OR_DEPARTMENT_OTHER): Payer: Self-pay | Admitting: Physician Assistant

## 2011-06-10 VITALS — BP 133/80 | HR 91 | Temp 98.5°F | Ht 58.5 in | Wt 155.7 lb

## 2011-06-10 DIAGNOSIS — C50319 Malignant neoplasm of lower-inner quadrant of unspecified female breast: Secondary | ICD-10-CM

## 2011-06-10 DIAGNOSIS — G609 Hereditary and idiopathic neuropathy, unspecified: Secondary | ICD-10-CM

## 2011-06-10 DIAGNOSIS — C50919 Malignant neoplasm of unspecified site of unspecified female breast: Secondary | ICD-10-CM

## 2011-06-10 DIAGNOSIS — C773 Secondary and unspecified malignant neoplasm of axilla and upper limb lymph nodes: Secondary | ICD-10-CM

## 2011-06-10 DIAGNOSIS — Z5111 Encounter for antineoplastic chemotherapy: Secondary | ICD-10-CM

## 2011-06-10 DIAGNOSIS — E119 Type 2 diabetes mellitus without complications: Secondary | ICD-10-CM

## 2011-06-10 LAB — CBC WITH DIFFERENTIAL/PLATELET
BASO%: 0.6 % (ref 0.0–2.0)
Basophils Absolute: 0 10*3/uL (ref 0.0–0.1)
EOS%: 1.2 % (ref 0.0–7.0)
Eosinophils Absolute: 0 10*3/uL (ref 0.0–0.5)
HCT: 30.6 % — ABNORMAL LOW (ref 34.8–46.6)
HGB: 10.5 g/dL — ABNORMAL LOW (ref 11.6–15.9)
LYMPH%: 35 % (ref 14.0–49.7)
MCH: 33.5 pg (ref 25.1–34.0)
MCHC: 34.3 g/dL (ref 31.5–36.0)
MCV: 97.8 fL (ref 79.5–101.0)
MONO#: 0.1 10*3/uL (ref 0.1–0.9)
MONO%: 4.8 % (ref 0.0–14.0)
NEUT#: 1.8 10*3/uL (ref 1.5–6.5)
NEUT%: 58.4 % (ref 38.4–76.8)
Platelets: 236 10*3/uL (ref 145–400)
RBC: 3.13 10*6/uL — ABNORMAL LOW (ref 3.70–5.45)
RDW: 20.6 % — ABNORMAL HIGH (ref 11.2–14.5)
WBC: 3.1 10*3/uL — ABNORMAL LOW (ref 3.9–10.3)
lymph#: 1.1 10*3/uL (ref 0.9–3.3)

## 2011-06-10 MED ORDER — DEXAMETHASONE SODIUM PHOSPHATE 4 MG/ML IJ SOLN
2.0000 mg | Freq: Once | INTRAMUSCULAR | Status: AC
  Administered 2011-06-10: 2 mg via INTRAVENOUS

## 2011-06-10 MED ORDER — FAMOTIDINE IN NACL 20-0.9 MG/50ML-% IV SOLN
20.0000 mg | Freq: Once | INTRAVENOUS | Status: AC
  Administered 2011-06-10: 20 mg via INTRAVENOUS

## 2011-06-10 MED ORDER — HEPARIN SOD (PORK) LOCK FLUSH 100 UNIT/ML IV SOLN
500.0000 [IU] | Freq: Once | INTRAVENOUS | Status: AC | PRN
  Administered 2011-06-10: 500 [IU]
  Filled 2011-06-10: qty 5

## 2011-06-10 MED ORDER — DIPHENHYDRAMINE HCL 25 MG PO TABS
25.0000 mg | ORAL_TABLET | Freq: Once | ORAL | Status: AC
  Administered 2011-06-10: 25 mg via ORAL
  Filled 2011-06-10: qty 1

## 2011-06-10 MED ORDER — SODIUM CHLORIDE 0.9 % IV SOLN: Freq: Once | INTRAVENOUS | Status: DC

## 2011-06-10 MED ORDER — ONDANSETRON 8 MG/50ML IVPB (CHCC)
8.0000 mg | Freq: Once | INTRAVENOUS | Status: AC
  Administered 2011-06-10: 8 mg via INTRAVENOUS

## 2011-06-10 MED ORDER — PACLITAXEL CHEMO INJECTION 300 MG/50ML
80.0000 mg/m2 | Freq: Once | INTRAVENOUS | Status: AC
  Administered 2011-06-10: 138 mg via INTRAVENOUS
  Filled 2011-06-10: qty 23

## 2011-06-10 MED ORDER — SODIUM CHLORIDE 0.9 % IJ SOLN
10.0000 mL | INTRAMUSCULAR | Status: DC | PRN
  Administered 2011-06-10: 10 mL
  Filled 2011-06-10: qty 10

## 2011-06-10 NOTE — Progress Notes (Signed)
ID: [name]  HPI: [name] is a 66 year old [place] woman referred by Dr. Luisa Hart for evaluation and treatment in the setting of newly diagnosed breast cancer.     [name] had screening mammography Sep 11, 2010, at [facility].  This showed a possible right axillary mass and she was brought back for additional views Sep 20, 2010.  An ultrasound showed abnormal enlarged lymph nodes in the right axilla measuring a maximum of 2.2 cm.  Biopsy was suggested and performed on Sep 25, 2010.  The pathology from that procedure ([id number]) showed metastatic adenocarcinoma consistent with a breast primary.  In particular, the tumor was estrogen and progesterone receptor positive, HER2/neu was negative, the MIB-1 was 40%.   With this information, the patient was referred for bilateral breast MRIs performed May 29.  This showed a complex picture, there being in addition to 2 pathologically enlarged lymph nodes in the right axilla and then other mildly prominent lymph nodes, 2 masses in the breast itself, one was subareolar and measured 1.3 cm, the second mass was more posterior and inferior and measured 8 mm.  The distance between these 2 masses was 4.5 cm. the second breast mass was biopsied and was found to be benign.  She has a deleterious BRCA-2 mutation, namely 6696 deletion TC.  Status post right mastectomy and axillary lymph node dissection December 04, 2010 for a 1.5 cm invasive ductal carcinoma, grade 3, involving 3/12 axillary lymph nodes, with ample margins, T1c N1 M0, estrogen receptor 94% positive, progesterone receptor 4% positive with an MIB-1 of 62% and no HER2 amplification on the original material.    The patient is currently being treated in the adjuvant setting, the goal being to complete 4 doses of doxorubicin and cyclophosphamide, followed by 12 weekly doses of paclitaxel.  Interim History:  Patient returns today for followup of her right breast carcinoma, due  for her 10th weekly dose of paclitaxel today, given in the adjuvant setting.   [name] is very "sleepy" today, but otherwise feels well. She continues to have some intermittent numbness and tingling, primarily affecting her right foot and ankle in her left common. She tells me this does come and go, and thus far is not affecting any of her day-to-day activities. The most part, this is only slightly increased since last week.  [name] sugars have been "good", much better controlled since she is now taking oral dexamethasone. She's eating and drinking well. She denies any nausea or change in bowels. No mouth ulcers or oral sensitivity. Her nails are dark, but not sore. She's had no skin changes, and no signs of abnormal bleeding.   A detailed review of systems is otherwise noncontributory as noted below.  Review of Systems: Constitutional:  no weight loss, fever, night sweats and feels well Eyes: negative [id number] Cardiovascular: no chest pain or dyspnea on exertion Respiratory: no cough, shortness of breath, or wheezing Neurological: occasional tingling in fingers and toes Dermatological: negative except for hyperpigmentation Gastrointestinal: no abdominal pain, nausea, change in bowel habits, or black or bloody stools Genito-Urinary: no dysuria, trouble voiding, or hematuria Hematological and Lymphatic: negative Breast: negative Musculoskeletal: negative Remaining ROS negative.  Medications: I have reviewed the patient's current medications.  Current Outpatient Prescriptions  Medication Sig Dispense Refill  . aspirin 81 MG tablet Take 81 mg by mouth daily.        . fluticasone (FLONASE) 50 MCG/ACT nasal spray Place 2 sprays into the nose daily.  16 g  2  . glipiZIDE (GLUCOTROL) 10 MG tablet Take 1 tablet (10 mg total) by mouth 2 (two) times daily before a meal.  90 tablet  3  . hydrochlorothiazide 25 MG tablet Take 25 mg by mouth daily.        Marland Kitchen lidocaine-prilocaine (EMLA) cream  Apply 1 application topically as needed. Prior to port access       . lisinopril (PRINIVIL,ZESTRIL) 40 MG tablet Take 1 tablet (40 mg total) by mouth daily.  30 tablet  11  . metFORMIN (GLUCOPHAGE) 500 MG tablet Take 1 tablet (500 mg total) by mouth 2 (two) times daily with a meal.  90 tablet  3  . oxyCODONE-acetaminophen (PERCOCET) 5-325 MG per tablet Take 1 tablet by mouth every 6 (six) hours as needed. For pain       . polyvinyl alcohol-povidone (REFRESH) 1.4-0.6 % ophthalmic solution Apply 1 drop to eye as needed. Diabetic eyes/dry eyes       . pravastatin (PRAVACHOL) 40 MG tablet Take 1 tablet (40 mg total) by mouth at bedtime.  90 tablet  5  . prochlorperazine (COMPAZINE) 10 MG tablet Take 10 mg by mouth 4 (four) times daily -  before meals and at bedtime. For nausea       . tobramycin-dexamethasone (TOBRADEX) ophthalmic solution 1 drop in each eye 1-2 times daily while receiving chemo  5 mL  2   Family History: patient's mother alive age 34; father deceased, cause unknown. The patient has 8 sisters and 9 brothers.  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  Physical Exam: Filed Vitals:   06/10/11 0939  BP: 133/80  Pulse: 91  Temp: 98.5 F (36.9 C)   HEENT:  Sclerae anicteric. Oropharynx clear.  No mucositis or candidiasis.   Nodes:  No cervical, supraclavicular, or axillary lymphadenopathy palpated.  Breast Exam:  Deferred Lungs:  Clear to auscultation bilaterally.  No crackles, rhonchi, or wheezes.   Heart:  Regular rate and rhythm.  Abdomen:  Soft, nontender.  Positive bowel sounds.  No organomegaly or masses palpated. Extremities:  Benign.  No peripheral edema or cyanosis.   Skin:  Hyperpigmented skin and  nailbeds bilaterally on the upper extremities.  Otherwise benign.   Neuro:  Nonfocal.   Lab Results:  CMP      Component Value Date/Time   NA 140 05/27/2011 0859   K 3.6 05/27/2011 0859   CL 104 05/27/2011 0859   CO2 28 05/27/2011 0859   GLUCOSE 78 05/27/2011 0859   BUN 11 05/27/2011 0859   CREATININE 0.59 05/27/2011 0859   CALCIUM 9.5 05/27/2011 0859   PROT 6.7 05/27/2011 0859   ALBUMIN 4.1 05/27/2011 0859   AST 14 05/27/2011 0859   ALT 19 05/27/2011 0859   ALKPHOS 47 05/27/2011 0859   BILITOT 0.2* 05/27/2011 0859   GFRNONAA >90 03/06/2011 1033   GFRAA >90 03/06/2011 1033    CBC Lab Results  Component Value Date   WBC 3.1* 06/10/2011   HGB 10.5* 06/10/2011   HCT 30.6* 06/10/2011   MCV 97.8 06/10/2011   PLT 236 06/10/2011    Studies/Results:  Dg Chest 2 View  03/13/2011  *RADIOLOGY REPORT*  Clinical Data: Evaluate Port-A-Cath.  CHEST - 2 VIEW  Comparison: Chest x-ray  03/07/2011.  Findings: The Port-A-Cath appears stable in position and appearance.  Very slight kinking of the catheter near the inferior margin of the clavicle.  The cardiac silhouette, mediastinal and hilar contours are stable.  Stable surgical changes from a right mastectomy.  No acute pulmonary findings.  IMPRESSION: Stable appearance of the left-sided Port-A-Cath.  Very slight kinking of the catheter near the inferior margin of the clavicle.  Original Report Authenticated By: P. Loralie Champagne, M.D.    Assessment:  66 year old BRCA-2 positive [name] woman, status post right mastectomy and axillary lymph node dissection August of 2012 for a T1c N1 (Stage II) invasive ductal carcinoma, grade 3, estrogen and progesterone receptor positive, HER-2 negative, with an MIB-1-1 of 62%,  1.  Status post 4 cycles of doxorubicin and cyclophosphamide given in dose dense fashion, currently receiving weekly paclitaxel, the plan being to complete 12 weekly doses.  Plan:  Patient will receive her 10th weekly dose of paclitaxel today as  planned. She continues to tolerate treatment well,but we will follow her closely for any increased signs of peripheral neuropathy, especially in light of her known diabetes. I did recommend that she begin a vitamin B complex daily.  I will see Sharronda again next week on February 12 prior to her 11th dose of paclitaxel, and she is scheduled out through her 12th planned dose of paclitaxel on February 19.   When she completes chemotherapy she will need to have her remaining ovary removed. She will need yearly MRIs in addition to mammography. We'll also have to discuss testing her family members.  The patient voices understanding and agreement with our plan. She knows to call with any changes or problems prior to her next scheduled appointment.  Nyeem Stoke, PA-C 06/10/2011

## 2011-06-16 ENCOUNTER — Telehealth: Payer: Self-pay | Admitting: Oncology

## 2011-06-16 NOTE — Telephone Encounter (Signed)
S/w the pt and she is aware of her gyn appt with dr neal on 07/01/2011@3 :15pm. gve the referral form to kim in medical records for her to fax over the necessary documents for this appt.

## 2011-06-17 ENCOUNTER — Encounter: Payer: Self-pay | Admitting: Physician Assistant

## 2011-06-17 ENCOUNTER — Ambulatory Visit: Payer: Self-pay | Admitting: Physician Assistant

## 2011-06-17 ENCOUNTER — Other Ambulatory Visit: Payer: Self-pay | Admitting: Lab

## 2011-06-17 ENCOUNTER — Telehealth: Payer: Self-pay | Admitting: *Deleted

## 2011-06-17 ENCOUNTER — Ambulatory Visit (HOSPITAL_BASED_OUTPATIENT_CLINIC_OR_DEPARTMENT_OTHER): Payer: Self-pay

## 2011-06-17 VITALS — BP 142/86 | HR 92 | Temp 98.5°F | Ht 58.5 in | Wt 156.3 lb

## 2011-06-17 DIAGNOSIS — C50919 Malignant neoplasm of unspecified site of unspecified female breast: Secondary | ICD-10-CM

## 2011-06-17 DIAGNOSIS — Z5111 Encounter for antineoplastic chemotherapy: Secondary | ICD-10-CM

## 2011-06-17 DIAGNOSIS — C773 Secondary and unspecified malignant neoplasm of axilla and upper limb lymph nodes: Secondary | ICD-10-CM

## 2011-06-17 LAB — CBC WITH DIFFERENTIAL/PLATELET
BASO%: 1.1 % (ref 0.0–2.0)
Basophils Absolute: 0 10*3/uL (ref 0.0–0.1)
EOS%: 1.1 % (ref 0.0–7.0)
Eosinophils Absolute: 0 10*3/uL (ref 0.0–0.5)
HCT: 30.7 % — ABNORMAL LOW (ref 34.8–46.6)
HGB: 10.5 g/dL — ABNORMAL LOW (ref 11.6–15.9)
LYMPH%: 28.8 % (ref 14.0–49.7)
MCH: 33.2 pg (ref 25.1–34.0)
MCHC: 34 g/dL (ref 31.5–36.0)
MCV: 97.6 fL (ref 79.5–101.0)
MONO#: 0.2 10*3/uL (ref 0.1–0.9)
MONO%: 4.9 % (ref 0.0–14.0)
NEUT#: 2.1 10*3/uL (ref 1.5–6.5)
NEUT%: 64.1 % (ref 38.4–76.8)
Platelets: 223 10*3/uL (ref 145–400)
RBC: 3.15 10*6/uL — ABNORMAL LOW (ref 3.70–5.45)
RDW: 20.2 % — ABNORMAL HIGH (ref 11.2–14.5)
WBC: 3.3 10*3/uL — ABNORMAL LOW (ref 3.9–10.3)
lymph#: 0.9 10*3/uL (ref 0.9–3.3)

## 2011-06-17 LAB — COMPREHENSIVE METABOLIC PANEL
ALT: 22 U/L (ref 0–35)
AST: 18 U/L (ref 0–37)
Albumin: 4.3 g/dL (ref 3.5–5.2)
Alkaline Phosphatase: 55 U/L (ref 39–117)
BUN: 12 mg/dL (ref 6–23)
CO2: 29 mEq/L (ref 19–32)
Calcium: 10.1 mg/dL (ref 8.4–10.5)
Chloride: 104 mEq/L (ref 96–112)
Creatinine, Ser: 0.61 mg/dL (ref 0.50–1.10)
Glucose, Bld: 76 mg/dL (ref 70–99)
Potassium: 3.6 mEq/L (ref 3.5–5.3)
Sodium: 142 mEq/L (ref 135–145)
Total Bilirubin: 0.2 mg/dL — ABNORMAL LOW (ref 0.3–1.2)
Total Protein: 7.1 g/dL (ref 6.0–8.3)

## 2011-06-17 MED ORDER — ONDANSETRON 8 MG/50ML IVPB (CHCC)
8.0000 mg | Freq: Once | INTRAVENOUS | Status: AC
  Administered 2011-06-17: 8 mg via INTRAVENOUS

## 2011-06-17 MED ORDER — HEPARIN SOD (PORK) LOCK FLUSH 100 UNIT/ML IV SOLN
500.0000 [IU] | Freq: Once | INTRAVENOUS | Status: AC | PRN
  Administered 2011-06-17: 500 [IU]
  Filled 2011-06-17: qty 5

## 2011-06-17 MED ORDER — PACLITAXEL CHEMO INJECTION 300 MG/50ML
80.0000 mg/m2 | Freq: Once | INTRAVENOUS | Status: AC
  Administered 2011-06-17: 138 mg via INTRAVENOUS
  Filled 2011-06-17: qty 23

## 2011-06-17 MED ORDER — DEXAMETHASONE SODIUM PHOSPHATE 4 MG/ML IJ SOLN
2.0000 mg | Freq: Once | INTRAMUSCULAR | Status: AC
  Administered 2011-06-17: 2 mg via INTRAVENOUS

## 2011-06-17 MED ORDER — SODIUM CHLORIDE 0.9 % IJ SOLN
10.0000 mL | INTRAMUSCULAR | Status: DC | PRN
  Administered 2011-06-17: 10 mL
  Filled 2011-06-17: qty 10

## 2011-06-17 MED ORDER — SODIUM CHLORIDE 0.9 % IV SOLN
Freq: Once | INTRAVENOUS | Status: AC
  Administered 2011-06-17: 10:00:00 via INTRAVENOUS

## 2011-06-17 MED ORDER — DIPHENHYDRAMINE HCL 25 MG PO TABS
25.0000 mg | ORAL_TABLET | Freq: Once | ORAL | Status: AC
  Administered 2011-06-17: 25 mg via ORAL
  Filled 2011-06-17: qty 1

## 2011-06-17 MED ORDER — FAMOTIDINE IN NACL 20-0.9 MG/50ML-% IV SOLN
20.0000 mg | Freq: Once | INTRAVENOUS | Status: AC
  Administered 2011-06-17: 20 mg via INTRAVENOUS

## 2011-06-17 NOTE — Telephone Encounter (Signed)
made patient appointment to see dr.squire on 06-25-2011

## 2011-06-17 NOTE — Progress Notes (Signed)
ID: [name]  HPI: [name] is a 66 year old [place] woman referred by Dr. Luisa Hart for evaluation and treatment in the setting of newly diagnosed breast cancer.     [name] had screening mammography Sep 11, 2010, at [facility].  This showed a possible right axillary mass and she was brought back for additional views Sep 20, 2010.  An ultrasound showed abnormal enlarged lymph nodes in the right axilla measuring a maximum of 2.2 cm.  Biopsy was suggested and performed on Sep 25, 2010.  The pathology from that procedure ([id number]) showed metastatic adenocarcinoma consistent with a breast primary.  In particular, the tumor was estrogen and progesterone receptor positive, HER2/neu was negative, the MIB-1 was 40%.   With this information, the patient was referred for bilateral breast MRIs performed May 29.  This showed a complex picture, there being in addition to 2 pathologically enlarged lymph nodes in the right axilla and then other mildly prominent lymph nodes, 2 masses in the breast itself, one was subareolar and measured 1.3 cm, the second mass was more posterior and inferior and measured 8 mm.  The distance between these 2 masses was 4.5 cm. the second breast mass was biopsied and was found to be benign.  She has a deleterious BRCA-2 mutation, namely 6696 deletion TC.  Status post right mastectomy and axillary lymph node dissection December 04, 2010 for a 1.5 cm invasive ductal carcinoma, grade 3, involving 3/12 axillary lymph nodes, with ample margins, T1c N1 M0, estrogen receptor 94% positive, progesterone receptor 4% positive with an MIB-1 of 62% and no HER2 amplification on the original material.    The patient is currently being treated in the adjuvant setting, the goal being to complete 4 doses of doxorubicin and cyclophosphamide, followed by 12 weekly doses of paclitaxel.  Interim History:  Patient returns today for followup of her right breast carcinoma, due  for her 11th weekly dose of paclitaxel today, given in the adjuvant setting.   [name] is feeling well, with no new complaints today. She continues to have some intermittent numbness and tingling. She tells me that only the left thumb consistently stays numb, but there is no persistent numbness elsewhere. She has brief episodes of neuropathy throughout the day, and notes that the tingling in her right foot "lingers the longest", but it does resolve. She's noted very little change since her treatment last week. This is not affecting any of her day-to-day activities. She has no problems walking, and no problems with fine motor skills.  Otherwise, [name] is eating well and denies any nausea or change in bowel habits. No abnormal headaches. No increased shortness of breath. No mouth ulcers or oral sensitivity. Her nail beds are dark, but there is no tenderness there is no additional skin changes.   A detailed review of systems is otherwise noncontributory as noted below.  Review of Systems: Constitutional:  no weight loss, fever, night sweats and feels well Eyes: negative EXB:[id number] Cardiovascular: no chest pain or dyspnea on exertion Respiratory: no cough, shortness of breath, or wheezing Neurological: occasional tingling in fingers and toes Dermatological: negative except for hyperpigmentation Gastrointestinal: no abdominal pain, nausea, change in bowel habits, or black or bloody stools Genito-Urinary: no dysuria, trouble voiding, or hematuria Hematological and Lymphatic: negative Breast: negative Musculoskeletal: negative Remaining ROS negative.  Medications: I have reviewed the patient's current medications.  Current Outpatient Prescriptions  Medication Sig Dispense Refill  . aspirin 81 MG tablet Take 81 mg by mouth daily.        Marland Kitchen  fluticasone (FLONASE) 50 MCG/ACT nasal spray Place 2 sprays into the nose daily.  16 g  2  . glipiZIDE (GLUCOTROL) 10 MG tablet Take 1 tablet (10 mg  total) by mouth 2 (two) times daily before a meal.  90 tablet  3  . hydrochlorothiazide 25 MG tablet Take 25 mg by mouth daily.        Marland Kitchen lidocaine-prilocaine (EMLA) cream Apply 1 application topically as needed. Prior to port access       . lisinopril (PRINIVIL,ZESTRIL) 40 MG tablet Take 1 tablet (40 mg total) by mouth daily.  30 tablet  11  . metFORMIN (GLUCOPHAGE) 500 MG tablet Take 1 tablet (500 mg total) by mouth 2 (two) times daily with a meal.  90 tablet  3  . oxyCODONE-acetaminophen (PERCOCET) 5-325 MG per tablet Take 1 tablet by mouth every 6 (six) hours as needed. For pain       . polyvinyl alcohol-povidone (REFRESH) 1.4-0.6 % ophthalmic solution Apply 1 drop to eye as needed. Diabetic eyes/dry eyes       . pravastatin (PRAVACHOL) 40 MG tablet Take 1 tablet (40 mg total) by mouth at bedtime.  90 tablet  5  . prochlorperazine (COMPAZINE) 10 MG tablet Take 10 mg by mouth 4 (four) times daily -  before meals and at bedtime. For nausea       . tobramycin-dexamethasone (TOBRADEX) ophthalmic solution 1 drop in each eye 1-2 times daily while receiving chemo  5 mL  2   Family History: patient's mother alive age 49; father deceased, cause unknown. The patient has 8 sisters and 9 brothers.  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  Physical Exam: Filed Vitals:   06/17/11 0926  BP: 142/86  Pulse: 92  Temp: 98.5 F (36.9 C)   HEENT:  Sclerae anicteric. Oropharynx clear.  No mucositis or candidiasis.   Nodes:  No cervical, supraclavicular, or axillary lymphadenopathy palpated.  Breast Exam:  Deferred Lungs:  Clear to auscultation bilaterally.  No crackles, rhonchi, or wheezes.   Heart:  Regular rate and rhythm. No gallops,  murmurs, or rubs. Abdomen:  Soft, nontender.  Positive bowel sounds.  No organomegaly or masses palpated. Extremities:  Benign.  No peripheral edema or cyanosis.   Skin:  Hyperpigmented skin and nailbeds bilaterally on the upper extremities.  Otherwise benign.   Neuro:  Nonfocal.   Lab Results:  CMP      Component Value Date/Time   NA 140 05/27/2011 0859   K 3.6 05/27/2011 0859   CL 104 05/27/2011 0859   CO2 28 05/27/2011 0859   GLUCOSE 78 05/27/2011 0859   BUN 11 05/27/2011 0859   CREATININE 0.59 05/27/2011 0859   CALCIUM 9.5 05/27/2011 0859   PROT 6.7 05/27/2011 0859   ALBUMIN 4.1 05/27/2011 0859   AST 14 05/27/2011 0859   ALT 19 05/27/2011 0859   ALKPHOS 47 05/27/2011 0859   BILITOT 0.2* 05/27/2011 0859   GFRNONAA >90 03/06/2011 1033   GFRAA >90 03/06/2011 1033    CBC Lab Results  Component Value Date   WBC 3.3* 06/17/2011   HGB 10.5* 06/17/2011   HCT 30.7* 06/17/2011   MCV 97.6 06/17/2011   PLT 223 06/17/2011    Studies/Results:  Dg Chest  2 View  03/13/2011  *RADIOLOGY REPORT*  Clinical Data: Evaluate Port-A-Cath.  CHEST - 2 VIEW  Comparison: Chest x-ray 03/07/2011.  Findings: The Port-A-Cath appears stable in position and appearance.  Very slight kinking of the catheter near the inferior margin of the clavicle.  The cardiac silhouette, mediastinal and hilar contours are stable.  Stable surgical changes from a right mastectomy.  No acute pulmonary findings.  IMPRESSION: Stable appearance of the left-sided Port-A-Cath.  Very slight kinking of the catheter near the inferior margin of the clavicle.  Original Report Authenticated By: P. Loralie Champagne, M.D.    Assessment:  66 year old BRCA-2 positive [name] woman, status post right mastectomy and axillary lymph node dissection August of 2012 for a T1c N1 (Stage II) invasive ductal carcinoma, grade 3, estrogen and progesterone receptor positive, HER-2 negative, with an MIB-1-1 of 62%,  1.  Status post 4 cycles of doxorubicin and  cyclophosphamide given in dose dense fashion, currently receiving weekly paclitaxel, the plan being to complete 12 weekly doses.  Plan:  Margaurite will proceed to treatment today as scheduled for her 11th weekly dose of paclitaxel. Again, her peripheral neuropathy appears stable, but we will continue to follow this closely. She is taking a vitamin B complex daily and will continue.  Shaquan is scheduled to meet with Dr. Luisa Hart later this week, and we'll see Dr. Darnelle Catalan next week on February 19 with her 12th and final dose of chemotherapy. We're scheduling her to follow up with Dr. Basilio Cairo as well, to discuss the possibility of radiation therapy.   When she completes chemotherapy she will need to have her remaining ovary removed. She will need yearly MRIs in addition to mammography. We'll also have to discuss testing her family members.  The patient voices understanding and agreement with our plan. She knows to call with any changes or problems prior to her next scheduled appointment.  Zollie Scale, PA-C 06/17/2011

## 2011-06-20 ENCOUNTER — Encounter (INDEPENDENT_AMBULATORY_CARE_PROVIDER_SITE_OTHER): Payer: Self-pay | Admitting: Surgery

## 2011-06-20 ENCOUNTER — Ambulatory Visit (INDEPENDENT_AMBULATORY_CARE_PROVIDER_SITE_OTHER): Payer: Self-pay | Admitting: Surgery

## 2011-06-20 VITALS — BP 144/96 | HR 70 | Temp 97.3°F | Resp 18 | Ht 59.0 in | Wt 156.2 lb

## 2011-06-20 DIAGNOSIS — Z853 Personal history of malignant neoplasm of breast: Secondary | ICD-10-CM

## 2011-06-20 NOTE — Patient Instructions (Signed)
Follow up in 6 months.  Will need close follow up due to genetic test.

## 2011-06-20 NOTE — Progress Notes (Signed)
NAME: [name]       DOB: [birth year]           DATE: 06/20/2011       MRN: [id number]   [name] is a 66 y.o.Marland Kitchenfemale who presents for routine followup of her  right breast cancer Stage 3  diagnosed in 12/2010  and treated with right  mastectomy and alnd with port. . She has no problems or concerns on either side.  PFSH: She has had no significant changes since the last visit here.  ROS: There have been no significant changes since the last visit here  EXAM: General: The patient is alert, oriented, generally healty appearing, NAD. Mood and affect are normal.  Breasts:  Right breast absent.  No mass.  Scar intact.  Left breast normal  Lymphatics: She has no axillary or supraclavicular adenopathy on either side.  Extremities: Full ROM of the surgical side with no lymphedema noted.  Data Reviewed:  Oncology notes  Impression: Doing well, with no evidence of recurrent cancer or new cancer  Plan: Will continue to follow up in 6 months. She is BRCA 2 positive and we talked about options of future mastectomy or MRI follow up.  We discussed reconstruction as well.  She would like to follow for now.

## 2011-06-24 ENCOUNTER — Ambulatory Visit: Payer: Self-pay | Admitting: Oncology

## 2011-06-24 ENCOUNTER — Telehealth: Payer: Self-pay | Admitting: *Deleted

## 2011-06-24 ENCOUNTER — Other Ambulatory Visit (HOSPITAL_BASED_OUTPATIENT_CLINIC_OR_DEPARTMENT_OTHER): Payer: Self-pay | Admitting: Lab

## 2011-06-24 ENCOUNTER — Encounter: Payer: Self-pay | Admitting: *Deleted

## 2011-06-24 ENCOUNTER — Ambulatory Visit (HOSPITAL_BASED_OUTPATIENT_CLINIC_OR_DEPARTMENT_OTHER): Payer: Self-pay

## 2011-06-24 VITALS — BP 159/88 | HR 92 | Temp 97.5°F | Ht 59.0 in | Wt 157.6 lb

## 2011-06-24 DIAGNOSIS — C773 Secondary and unspecified malignant neoplasm of axilla and upper limb lymph nodes: Secondary | ICD-10-CM

## 2011-06-24 DIAGNOSIS — Z5111 Encounter for antineoplastic chemotherapy: Secondary | ICD-10-CM

## 2011-06-24 DIAGNOSIS — C50919 Malignant neoplasm of unspecified site of unspecified female breast: Secondary | ICD-10-CM

## 2011-06-24 LAB — CBC WITH DIFFERENTIAL/PLATELET
BASO%: 0.9 % (ref 0.0–2.0)
Basophils Absolute: 0 10*3/uL (ref 0.0–0.1)
EOS%: 0.9 % (ref 0.0–7.0)
Eosinophils Absolute: 0 10*3/uL (ref 0.0–0.5)
HCT: 31.1 % — ABNORMAL LOW (ref 34.8–46.6)
HGB: 10.4 g/dL — ABNORMAL LOW (ref 11.6–15.9)
LYMPH%: 32 % (ref 14.0–49.7)
MCH: 31.6 pg (ref 25.1–34.0)
MCHC: 33.4 g/dL (ref 31.5–36.0)
MCV: 94.5 fL (ref 79.5–101.0)
MONO#: 0.2 10*3/uL (ref 0.1–0.9)
MONO%: 6 % (ref 0.0–14.0)
NEUT#: 2 10*3/uL (ref 1.5–6.5)
NEUT%: 60.2 % (ref 38.4–76.8)
Platelets: 276 10*3/uL (ref 145–400)
RBC: 3.29 10*6/uL — ABNORMAL LOW (ref 3.70–5.45)
RDW: 18.4 % — ABNORMAL HIGH (ref 11.2–14.5)
WBC: 3.3 10*3/uL — ABNORMAL LOW (ref 3.9–10.3)
lymph#: 1.1 10*3/uL (ref 0.9–3.3)
nRBC: 0 % (ref 0–0)

## 2011-06-24 LAB — COMPREHENSIVE METABOLIC PANEL
ALT: 18 U/L (ref 0–35)
AST: 15 U/L (ref 0–37)
Albumin: 4.2 g/dL (ref 3.5–5.2)
Alkaline Phosphatase: 51 U/L (ref 39–117)
BUN: 12 mg/dL (ref 6–23)
CO2: 30 mEq/L (ref 19–32)
Calcium: 9.7 mg/dL (ref 8.4–10.5)
Chloride: 100 mEq/L (ref 96–112)
Creatinine, Ser: 0.61 mg/dL (ref 0.50–1.10)
Glucose, Bld: 59 mg/dL — ABNORMAL LOW (ref 70–99)
Potassium: 3.4 mEq/L — ABNORMAL LOW (ref 3.5–5.3)
Sodium: 138 mEq/L (ref 135–145)
Total Bilirubin: 0.2 mg/dL — ABNORMAL LOW (ref 0.3–1.2)
Total Protein: 6.7 g/dL (ref 6.0–8.3)

## 2011-06-24 MED ORDER — FAMOTIDINE IN NACL 20-0.9 MG/50ML-% IV SOLN
20.0000 mg | Freq: Once | INTRAVENOUS | Status: AC
  Administered 2011-06-24: 20 mg via INTRAVENOUS

## 2011-06-24 MED ORDER — DIPHENHYDRAMINE HCL 25 MG PO TABS
25.0000 mg | ORAL_TABLET | Freq: Once | ORAL | Status: AC
  Administered 2011-06-24: 25 mg via ORAL
  Filled 2011-06-24: qty 1

## 2011-06-24 MED ORDER — ONDANSETRON 8 MG/50ML IVPB (CHCC)
8.0000 mg | Freq: Once | INTRAVENOUS | Status: AC
  Administered 2011-06-24: 8 mg via INTRAVENOUS

## 2011-06-24 MED ORDER — SODIUM CHLORIDE 0.9 % IV SOLN: Freq: Once | INTRAVENOUS | Status: DC

## 2011-06-24 MED ORDER — PACLITAXEL CHEMO INJECTION 300 MG/50ML
80.0000 mg/m2 | Freq: Once | INTRAVENOUS | Status: AC
  Administered 2011-06-24: 138 mg via INTRAVENOUS
  Filled 2011-06-24: qty 23

## 2011-06-24 MED ORDER — DEXAMETHASONE SODIUM PHOSPHATE 4 MG/ML IJ SOLN
2.0000 mg | Freq: Once | INTRAMUSCULAR | Status: AC
  Administered 2011-06-24: 2 mg via INTRAVENOUS

## 2011-06-24 NOTE — Telephone Encounter (Signed)
gave patient appointment for 08-2011 spoke with dr.kaplan office the patient has no insurance per the office the patient would have to pay 175.00 up front before they will give the patient appointment

## 2011-06-24 NOTE — Progress Notes (Signed)
ID: [name]   DOB: [birth year]  MR#: [id number]  WUJ#:[id number]  HISTORY OF PRESENT ILLNESS: Ms. Hey had screening mammography Sep 11, 2010, at [facility]. This showed a possible right axillary mass and she was brought back for additional views Sep 20, 2010. An ultrasound showed abnormal enlarged lymph nodes in the right axilla measuring a maximum of 2.2 cm. Biopsy was suggested and performed on Sep 25, 2010. The pathology from that procedure ([id number]) showed metastatic adenocarcinoma consistent with a breast primary. In particular, the tumor was estrogen and progesterone receptor positive, HER2/neu was negative, the MIB-1 was 40%.  With this information, the patient was referred for bilateral breast MRIs performed May 29. This showed a complex picture, there being in addition to 2 pathologically enlarged lymph nodes in the right axilla and then other mildly prominent lymph nodes, 2 masses in the breast itself, one was subareolar and measured 1.3 cm, the second mass was more posterior and inferior and measured 8 mm. The distance between these 2 masses was 4.5 cm. the second breast mass was biopsied and was found to be benign.  She has a deleterious BRCA-2 mutation, namely 6696 deletion TC.  Status post right mastectomy and axillary lymph node dissection December 04, 2010 for a 1.5 cm invasive ductal carcinoma, grade 3, involving 3/12 axillary lymph nodes, with ample margins, T1c N1 M0, estrogen receptor 94% positive, progesterone receptor 4% positive with an MIB-1 of 62% and no HER2 amplification on the original material. Further therapy as detailed below.  INTERVAL HISTORY: She is doing "good", and tolerating her paclitaxel with "not a whole lot" of side effects.  REVIEW OF SYSTEMS: Asked what her worse problem is, she says it's a little bit of nausea associated with certain smells. She she has some taste per version secondary to the chemotherapy and she understands this will last for a  few more months. She has some nail hyperpigmentation. That will take months to clear. She has chronic sinus problems, which have not changed. Otherwise a detailed review of systems today was noncontributory  PAST MEDICAL HISTORY: Past Medical History  Diagnosis Date  . Chest pain   . GERD (gastroesophageal reflux disease)   . Allergy   . Anemia   . Diabetes mellitus   . Hypertension   . Hyperlipidemia   . Fatty liver disease, nonalcoholic   . History of hyperkalemia   . Obesity   . Renal lesion   . Sleep disturbances   . Hemorrhoids   . Otitis media   . Abdominal pain, RUQ   . OA (osteoarthritis) of knee     Bilateral  . Breast CA     (Rt) breast ca dx 4/12    PAST SURGICAL HISTORY: Past Surgical History  Procedure Date  . Abdominal hysterectomy   . Tubal ligation   . Knee arthroscopy 2000    Right  . Portacath placement 03/07/11  . Breast surgery     right- mastectomy    FAMILY HISTORY Family History  Problem Relation Age of Onset  . Stroke Father   . Heart disease Sister   . Cancer Sister     breast  . Heart disease Brother   patient's mother alive age 57; father deceased, cause unknown. The patient has 8 sisters and 9 brothers.   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   ADVANCED DIRECTIVES:  HEALTH MAINTENANCE: History  Substance Use Topics  . Smoking status: Never Smoker   . Smokeless tobacco: Never Used  . Alcohol Use: No     Colonoscopy:  PAP:  Bone density:  Lipid panel:  Allergies  Allergen Reactions  . Metformin Other (See Comments)    REACTION, diarrhea: 850 mg - diarrhea but tolerates 500 mg daily    Current  Outpatient Prescriptions  Medication Sig Dispense Refill  . aspirin 81 MG tablet Take 81 mg by mouth daily.        . fluticasone (FLONASE) 50 MCG/ACT nasal spray Place 2 sprays into the nose daily.  16 g  2  . glipiZIDE (GLUCOTROL) 10 MG tablet Take 1 tablet (10 mg total) by mouth 2 (two) times daily before a meal.  90 tablet  3  . hydrochlorothiazide 25 MG tablet Take 25 mg by mouth daily.        Marland Kitchen lidocaine-prilocaine (EMLA) cream Apply 1 application topically as needed. Prior to port access       . lisinopril (PRINIVIL,ZESTRIL) 40 MG tablet Take 1 tablet (40 mg total) by mouth daily.  30 tablet  11  . metFORMIN (GLUCOPHAGE) 500 MG tablet Take 1 tablet (500 mg total) by mouth 2 (two) times daily with a meal.  90 tablet  3  . oxyCODONE-acetaminophen (PERCOCET) 5-325 MG per tablet Take 1 tablet by mouth every 6 (six) hours as needed. For pain       . polyvinyl alcohol-povidone (REFRESH) 1.4-0.6 % ophthalmic solution Apply 1 drop to eye as needed. Diabetic eyes/dry eyes       . pravastatin (PRAVACHOL) 40 MG tablet Take 1 tablet (40 mg total) by mouth at bedtime.  90 tablet  5  . prochlorperazine (COMPAZINE) 10 MG tablet Take 10 mg by mouth 4 (four) times daily -  before meals and at bedtime. For nausea       . tobramycin-dexamethasone (TOBRADEX) ophthalmic solution 1 drop in each eye 1-2 times daily while receiving chemo  5 mL  2    OBJECTIVE: Filed Vitals:   06/24/11 0913  BP: 159/88  Pulse: 92  Temp: 97.5 F (36.4 C)     Body mass index is 31.83 kg/(m^2).    ECOG FS:  Sclerae unicteric Oropharynx clear No peripheral adenopathy Lungs no rales or rhonchi Heart regular rate and rhythm Abd benign MSK no focal spinal tenderness, no peripheral edema Neuro: nonfocal Breasts: Status post right mastectomy, no evidence of local recurrence. Left breast no suspicious masses  LAB RESULTS: Lab Results  Component Value Date   WBC 3.3* 06/24/2011   NEUTROABS 2.0 06/24/2011   HGB 10.4*  06/24/2011   HCT 31.1* 06/24/2011   MCV 94.5 06/24/2011   PLT 276 06/24/2011      Chemistry      Component Value Date/Time   NA 142 06/17/2011 0913   K 3.6 06/17/2011 0913   CL 104 06/17/2011 0913   CO2 29 06/17/2011 0913   BUN 12 06/17/2011 0913   CREATININE 0.61 06/17/2011 0913   GLU 132* 03/25/2011 0957      Component Value Date/Time   CALCIUM 10.1 06/17/2011 0913   ALKPHOS 55 06/17/2011 0913   AST 18 06/17/2011 0913   ALT 22 06/17/2011 0913   BILITOT 0.2* 06/17/2011 0913  Lab Results  Component Value Date   LABCA2 14 04/22/2011    No results found for this basename: INR:1;PROTIME:1 in the last 168 hours  No results found for this basename: UACOL:1,UAPR:1,USPG:1,UPH:1,UTP:1,UGL:1,UKET:1,UBIL:1,UHGB:1,UNIT:1,UROB:1,ULEU:1,UEPI:1,UWBC:1,URBC:1,UBAC:1,CAST:1,CRYS:1,UCOM:1,BILUA:1 in the last 72 hours   STUDIES: No results found.  ASSESSMENT: 66 year old BRCA-2 positive [employer] woman,  (1) status post right mastectomy and axillary lymph node dissection August of 2012 for a T1c N1 (Stage II) invasive ductal carcinoma, grade 3, estrogen and progesterone receptor positive, HER-2 negative, with an MIB-1-1 of 62%,   (2) Status post 4 cycles of doxorubicin and cyclophosphamide given in dose dense fashion, followed  by weekly paclitaxel x 12 completed 06/24/2011  PLAN: She is a ready scheduled to see Dr. Montine Circle 20th to discuss postmastectomy radiation. I am referring her to Dr. Ilda Mori for removal of her remaining ovary. She will return to see me late April, by which time I think she will be ready to start anti-estrogens. She knows to call for any problems that may develop before the next visit.   Bostyn Bogie C    06/24/2011

## 2011-06-25 ENCOUNTER — Ambulatory Visit
Admission: RE | Admit: 2011-06-25 | Discharge: 2011-06-25 | Disposition: A | Discharge status: Home / Self Care | Payer: Self-pay | Source: Ambulatory Visit | Attending: Radiation Oncology | Admitting: Radiation Oncology

## 2011-06-25 ENCOUNTER — Encounter: Payer: Self-pay | Admitting: Radiation Oncology

## 2011-06-25 VITALS — BP 131/83 | HR 96 | Temp 98.1°F | Wt 160.3 lb

## 2011-06-25 DIAGNOSIS — C773 Secondary and unspecified malignant neoplasm of axilla and upper limb lymph nodes: Secondary | ICD-10-CM | POA: Insufficient documentation

## 2011-06-25 DIAGNOSIS — C50919 Malignant neoplasm of unspecified site of unspecified female breast: Secondary | ICD-10-CM

## 2011-06-25 DIAGNOSIS — Z170 Estrogen receptor positive status [ER+]: Secondary | ICD-10-CM | POA: Insufficient documentation

## 2011-06-25 DIAGNOSIS — Z7982 Long term (current) use of aspirin: Secondary | ICD-10-CM | POA: Insufficient documentation

## 2011-06-25 DIAGNOSIS — Z9221 Personal history of antineoplastic chemotherapy: Secondary | ICD-10-CM | POA: Insufficient documentation

## 2011-06-25 DIAGNOSIS — Z1501 Genetic susceptibility to malignant neoplasm of breast: Secondary | ICD-10-CM | POA: Insufficient documentation

## 2011-06-25 DIAGNOSIS — Z510 Encounter for antineoplastic radiation therapy: Secondary | ICD-10-CM | POA: Insufficient documentation

## 2011-06-25 DIAGNOSIS — L988 Other specified disorders of the skin and subcutaneous tissue: Secondary | ICD-10-CM | POA: Insufficient documentation

## 2011-06-25 DIAGNOSIS — Z9079 Acquired absence of other genital organ(s): Secondary | ICD-10-CM | POA: Insufficient documentation

## 2011-06-25 DIAGNOSIS — Z9010 Acquired absence of unspecified breast and nipple: Secondary | ICD-10-CM | POA: Insufficient documentation

## 2011-06-25 DIAGNOSIS — R110 Nausea: Secondary | ICD-10-CM | POA: Insufficient documentation

## 2011-06-25 DIAGNOSIS — C50119 Malignant neoplasm of central portion of unspecified female breast: Secondary | ICD-10-CM | POA: Insufficient documentation

## 2011-06-25 DIAGNOSIS — Z79899 Other long term (current) drug therapy: Secondary | ICD-10-CM | POA: Insufficient documentation

## 2011-06-25 NOTE — Progress Notes (Signed)
[facility] Radiation Oncology Follow up Note  Name: [name]   Date: 06/25/2011    MRN: [id number] DOB: 09/03/1945  CC:  Jillian Jefferson, MD, MD  [name], Jillian Hue, MD  DIAGNOSIS: pT1cN1M0  ER positive PR weakly positive HER-2/neu negative high grade invasive ductal carcinoma of the right breast  Narrative: Patient returns for followup. She underwent a right mastectomy and axillary lymph node dissection on 12/04/2010. I have reviewed the pathology report. Her margins are widely negative. There was lymphovascular space invasion. 3/12 axillary lymph nodes were positive. There was extracapsular extension. She then went on to receive chemotherapy. She received 4 cycles of doxorubicin and cyclophosphamide, dose dense, followed by 12 weekly cycles of paclitaxel. She was also found to have a BRCA 2 mutation. She did discuss a contralateral mastectomy as well a oopherectomy with her surgeon. She is deferred the opportunity to have a contralateral mastectomy. However she will be meeting with an gynecologist next week to further discuss oopherectomy . She completed her chemotherapy yesterday. She's had a little bit of peripheral neuropathy but overall she has tolerated the chemotherapy well.    ALLERGIES: Metformin   MEDICATIONS:  Current Outpatient Prescriptions  Medication Sig Dispense Refill  . aspirin 81 MG tablet Take 81 mg by mouth daily.        . fluticasone (FLONASE) 50 MCG/ACT nasal spray Place 2 sprays into the nose daily.  16 g  2  . glipiZIDE (GLUCOTROL) 10 MG tablet Take 1 tablet (10 mg total) by mouth 2 (two) times daily before a meal.  90 tablet  3  . hydrochlorothiazide 25 MG tablet Take 25 mg by mouth daily.        Marland Kitchen lidocaine-prilocaine (EMLA) cream Apply 1 application topically as needed. Prior to port access       . lisinopril (PRINIVIL,ZESTRIL) 40 MG tablet Take 1 tablet (40 mg total) by mouth daily.  30 tablet  11  . metFORMIN (GLUCOPHAGE) 500 MG tablet  Take 1 tablet (500 mg total) by mouth 2 (two) times daily with a meal.  90 tablet  3  . oxyCODONE-acetaminophen (PERCOCET) 5-325 MG per tablet Take 1 tablet by mouth every 6 (six) hours as needed. For pain       . pravastatin (PRAVACHOL) 40 MG tablet Take 1 tablet (40 mg total) by mouth at bedtime.  90 tablet  5  . tobramycin-dexamethasone (TOBRADEX) ophthalmic solution 1 drop in each eye 1-2 times daily while receiving chemo  5 mL  2  . prochlorperazine (COMPAZINE) 10 MG tablet Take 10 mg by mouth 4 (four) times daily -  before meals and at bedtime. For nausea          PHYSICAL EXAM:   weight is 160 lb 4.8 oz (72.712 kg). Her temperature is 98.1 F (36.7 C). Her blood pressure is 131/83 and her pulse is 96.  She has no palpable supraclavicular lymphadenopathy. She has no palpable axillary adenopathy. The left breast demonstrates no concerning lesions to palpation. The right chest wall demonstrates numerous skin folds which I explained to the patient may increase her skin toxicity from radiation but she does not have any concerning lesions I can palpate.   LABORATORY DATA:  Lab Results  Component Value Date   WBC 3.3* 06/24/2011   HGB 10.4* 06/24/2011   HCT 31.1* 06/24/2011   MCV 94.5 06/24/2011   PLT 276 06/24/2011   Lab Results  Component Value Date   NA 138 06/24/2011  K 3.4* 06/24/2011   CL 100 06/24/2011   CO2 30 06/24/2011   Lab Results  Component Value Date   ALT 18 06/24/2011   AST 15 06/24/2011   ALKPHOS 51 06/24/2011   BILITOT 0.2* 06/24/2011       Pathology as above  IMPRESSION/ PLAN: Due to her lymph node involvement I have recommended postmastectomy radiotherapy. I Explained that there is a local control benefit and also benefit in terms of overall survival for women with locally advanced breast cancer.  I would deliver radiation over approximately 6 weeks. To treat her chest wall/ supraclavicular fossa and then provide chest wall boost treatment to the scar.  We discussed  the risks, benefits, and side effects of radiotherapy. No guarantees of treatment were given. A consent form was signed and placed in the patient's medical record. The patient is enthusiastic about proceeding with treatment. I look forward to participating in the patient's care. I will give her a little break following chemotherapy and schedule her for simulation in early March. She is enthusiastic about proceeding.

## 2011-06-25 NOTE — Progress Notes (Signed)
Encounter addended by: Delynn Flavin, RN on: 06/25/2011  6:55 PM<BR>     Documentation filed: Charges VN

## 2011-06-25 NOTE — Progress Notes (Signed)
Completed Paclitaxel on yesterday- last cycle.  States she has some fatigue but continues to perform her household chores without difficulty.  Denies pain.  Wearing  Wig today.  Darkened nails. Dysguesia Reports her complexion was darker during chemotherapy.

## 2011-06-25 NOTE — Progress Notes (Signed)
Encounter addended by: Delynn Flavin, RN on: 06/25/2011  7:06 PM<BR>     Documentation filed: Inpatient Patient Education

## 2011-06-25 NOTE — Progress Notes (Signed)
Please see the Nurse Progress Note in the MD Initial Consult Encounter for this patient. 

## 2011-07-04 ENCOUNTER — Ambulatory Visit
Admission: RE | Admit: 2011-07-04 | Discharge: 2011-07-04 | Disposition: A | Discharge status: Home / Self Care | Payer: Self-pay | Source: Ambulatory Visit | Attending: Radiation Oncology | Admitting: Radiation Oncology

## 2011-07-04 ENCOUNTER — Encounter: Payer: Self-pay | Admitting: Radiation Oncology

## 2011-07-04 DIAGNOSIS — C50119 Malignant neoplasm of central portion of unspecified female breast: Secondary | ICD-10-CM

## 2011-07-04 HISTORY — PX: OTHER SURGICAL HISTORY: SHX169

## 2011-07-04 NOTE — Progress Notes (Signed)
SIMULATION / TREATMENT PLANNING NOTE   Diagnosis: pT1cN1M0 Right breast cancer  The patient was taken to the CT simulator and laid in the supine position, arms over her head, and her head in an Accuform device.  High resolution CT axial imaging was obtained of the patient's chest .  An isocenter was placed in the chest wall at the level of the base of the right supraclavicular head. Skin markings were made.  I plan to treat the patient's right chest wall with opposed tangents, using MLCs for custom blocks, to a dose of 50Gy/25 fractions. I will also treat her right supraclavicular fossa to 46 Gy in 23 fractions:  MLCs will provide custom blocking for this LAO field. I anticipate planning a boost to the lumpetomy cavity of 10 Gy/5 fractions in the future.

## 2011-07-08 ENCOUNTER — Other Ambulatory Visit: Payer: Self-pay | Admitting: Internal Medicine

## 2011-07-09 ENCOUNTER — Encounter (HOSPITAL_BASED_OUTPATIENT_CLINIC_OR_DEPARTMENT_OTHER): Payer: Self-pay | Admitting: *Deleted

## 2011-07-09 NOTE — Progress Notes (Signed)
NPO AFTER MN. PRE=OP ORDER PENDING. UNLESS OTHERWISE ORDERED PT WILL NEED ISTAT.  (LAST LABS IN EPIC CBC W/ DIFF , CMET ON 06-24-11). CURRENT EKG AND CXR IN CHART .

## 2011-07-11 ENCOUNTER — Encounter: Payer: Self-pay | Admitting: Radiation Oncology

## 2011-07-11 ENCOUNTER — Ambulatory Visit
Admission: RE | Admit: 2011-07-11 | Discharge: 2011-07-11 | Disposition: A | Discharge status: Home / Self Care | Payer: Self-pay | Source: Ambulatory Visit | Attending: Radiation Oncology | Admitting: Radiation Oncology

## 2011-07-11 DIAGNOSIS — C50919 Malignant neoplasm of unspecified site of unspecified female breast: Secondary | ICD-10-CM

## 2011-07-11 DIAGNOSIS — E119 Type 2 diabetes mellitus without complications: Secondary | ICD-10-CM

## 2011-07-11 DIAGNOSIS — D649 Anemia, unspecified: Secondary | ICD-10-CM

## 2011-07-11 DIAGNOSIS — M545 Low back pain, unspecified: Secondary | ICD-10-CM

## 2011-07-11 DIAGNOSIS — E785 Hyperlipidemia, unspecified: Secondary | ICD-10-CM

## 2011-07-11 DIAGNOSIS — Z86010 Personal history of colonic polyps: Secondary | ICD-10-CM

## 2011-07-11 DIAGNOSIS — K7689 Other specified diseases of liver: Secondary | ICD-10-CM

## 2011-07-11 DIAGNOSIS — I10 Essential (primary) hypertension: Secondary | ICD-10-CM

## 2011-07-11 DIAGNOSIS — M1990 Unspecified osteoarthritis, unspecified site: Secondary | ICD-10-CM

## 2011-07-11 DIAGNOSIS — E669 Obesity, unspecified: Secondary | ICD-10-CM

## 2011-07-11 DIAGNOSIS — C50119 Malignant neoplasm of central portion of unspecified female breast: Secondary | ICD-10-CM

## 2011-07-11 DIAGNOSIS — J309 Allergic rhinitis, unspecified: Secondary | ICD-10-CM

## 2011-07-11 MED ORDER — RADIAPLEXRX EX GEL
Freq: Once | CUTANEOUS | Status: AC
  Administered 2011-07-11: 1 via TOPICAL

## 2011-07-11 MED ORDER — ALRA NON-METALLIC DEODORANT (RAD-ONC): 1.0000 "application " | Freq: Once | TOPICAL | Status: DC

## 2011-07-11 NOTE — Progress Notes (Signed)
POST SIM ED DONE WITH PATIENT, VERBALIZES UNDERSTANDING.  RADIAPLEX AND ALRA DEODORANT GIVEN WITH INSTRUCTIONS FOR USE.

## 2011-07-11 NOTE — H&P (Signed)
NAME:  Jillian, Ryan                  ACCOUNT NO.:  MEDICAL RECORD NO.:  [id number]  LOCATION:                                 FACILITY:  PHYSICIAN:  Freddy Finner, M.D.   DATE OF BIRTH:  11-30-1945  DATE OF ADMISSION: DATE OF DISCHARGE:                             HISTORY & PHYSICAL   [name].  ADMITTING DIAGNOSES:  Right breast cancer, the patient with positive BRCA gene residual ovary making her at significant risk for ovarian cancer.  HISTORY OF PRESENT ILLNESS:  The patient is a 66 year old black married female, gravida 4, para 4, who is a patient of Dr. Raymond Gurney Ryan for treatment of breast cancer.  Her workup included a BRCA gene test, which was positive, and on his recommendation, she is now admitted for resection of her remaining right ovary.  By history, she had a total abdominal hysterectomy and left salpingo-oophorectomy many years ago. She also is scheduled to start radiation therapy for her breast cancer in the immediate future.  REVIEW OF SYSTEMS:  Otherwise negative.  She has no cardiopulmonary, GI, or GU complaints.  PAST MEDICAL HISTORY:  The patient is known to have hypertension, type 2 diabetes, and hypercholesterolemia.  MEDICATIONS:  She takes, 1. Hydrochlorothiazide. 2. Metformin 500 mg a day. 3. Pravastatin. 4. Glyburide. 5. She normally takes baby aspirin daily, which she has discontinued a     week before the surgery.  PAST SURGICAL HISTORY:  Previous operative procedures include hysterectomy noted above.  She had an arthroscopic surgery of her right knee and she had a right mastectomy.  ALLERGIES: She has no known allergies to medications.  SOCIAL HISTORY:  She is not a cigarette smoker nor does she use alcohol. She has never had a blood transfusion.  PHYSICAL EXAMINATION:  HEENT:  Grossly within normal limits. NECK:  Thyroid gland is not palpably enlarged. VITAL SIGNS:  Blood pressure in the office 118/80. CHEST:  Clear  to auscultation throughout. HEART:  Normal sinus rhythm without murmurs, rubs, or gallops. ABDOMEN:  Soft and nontender without appreciable organomegaly or palpable masses. PELVIC EXAMINATION:  Reveals normal external genitalia,  vagina, and vaginal cuff  except for atrophic changes.  Bimanual is negative.  No palpable masses.  No enlargement of the ovary.  The rectum is normal and rectovaginal exam confirms these findings.  Ultrasound has revealed that the right ovary is small and normal for age. EXTREMITIES:  Without cyanosis, clubbing, or edema.  ASSESSMENT:  Increased risk of ovarian cancer due to positive BRCA gene, residual right ovary left at the time of her hysterectomy many years ago.  PLAN:  Laparoscopic right salpingo-oophorectomy.  The patient has reviewed the ACOG booklets on laparoscopy and we have discussed the potential risks of procedure including injury to other organs with a larger procedure to follow, the risk of an open procedure, the risk of deep vein thrombosis, the risk of infection, or hemorrhage.  Appropriate prophylactic measures including PAS hose will be taken.  The patient is admitted now prepared to proceed with surgery.     Freddy Finner, M.D.     WRN/MEDQ  D:  07/10/2011  T:  07/10/2011  Job:  [id number]

## 2011-07-14 ENCOUNTER — Encounter (HOSPITAL_BASED_OUTPATIENT_CLINIC_OR_DEPARTMENT_OTHER): Payer: Self-pay | Admitting: Anesthesiology

## 2011-07-14 ENCOUNTER — Ambulatory Visit: Payer: Self-pay

## 2011-07-14 ENCOUNTER — Ambulatory Visit (HOSPITAL_BASED_OUTPATIENT_CLINIC_OR_DEPARTMENT_OTHER): Payer: Self-pay | Admitting: Anesthesiology

## 2011-07-14 ENCOUNTER — Ambulatory Visit (HOSPITAL_BASED_OUTPATIENT_CLINIC_OR_DEPARTMENT_OTHER)
Admission: RE | Admit: 2011-07-14 | Discharge: 2011-07-14 | Disposition: A | Discharge status: Home / Self Care | Payer: Self-pay | Source: Ambulatory Visit | Attending: Obstetrics & Gynecology | Admitting: Obstetrics & Gynecology

## 2011-07-14 ENCOUNTER — Encounter (HOSPITAL_BASED_OUTPATIENT_CLINIC_OR_DEPARTMENT_OTHER): Payer: Self-pay | Admitting: *Deleted

## 2011-07-14 ENCOUNTER — Encounter (HOSPITAL_BASED_OUTPATIENT_CLINIC_OR_DEPARTMENT_OTHER)
Admission: RE | Disposition: A | Discharge status: Home / Self Care | Payer: Self-pay | Source: Ambulatory Visit | Attending: Obstetrics & Gynecology

## 2011-07-14 DIAGNOSIS — Z4002 Encounter for prophylactic removal of ovary: Secondary | ICD-10-CM | POA: Insufficient documentation

## 2011-07-14 DIAGNOSIS — C50119 Malignant neoplasm of central portion of unspecified female breast: Secondary | ICD-10-CM

## 2011-07-14 DIAGNOSIS — D649 Anemia, unspecified: Secondary | ICD-10-CM

## 2011-07-14 DIAGNOSIS — K7689 Other specified diseases of liver: Secondary | ICD-10-CM

## 2011-07-14 DIAGNOSIS — J309 Allergic rhinitis, unspecified: Secondary | ICD-10-CM

## 2011-07-14 DIAGNOSIS — M1990 Unspecified osteoarthritis, unspecified site: Secondary | ICD-10-CM

## 2011-07-14 DIAGNOSIS — Z86010 Personal history of colonic polyps: Secondary | ICD-10-CM

## 2011-07-14 DIAGNOSIS — Z9079 Acquired absence of other genital organ(s): Secondary | ICD-10-CM | POA: Insufficient documentation

## 2011-07-14 DIAGNOSIS — E7800 Pure hypercholesterolemia, unspecified: Secondary | ICD-10-CM | POA: Insufficient documentation

## 2011-07-14 DIAGNOSIS — C50919 Malignant neoplasm of unspecified site of unspecified female breast: Secondary | ICD-10-CM | POA: Insufficient documentation

## 2011-07-14 DIAGNOSIS — I10 Essential (primary) hypertension: Secondary | ICD-10-CM | POA: Insufficient documentation

## 2011-07-14 DIAGNOSIS — Z1501 Genetic susceptibility to malignant neoplasm of breast: Secondary | ICD-10-CM | POA: Insufficient documentation

## 2011-07-14 DIAGNOSIS — E785 Hyperlipidemia, unspecified: Secondary | ICD-10-CM

## 2011-07-14 DIAGNOSIS — M545 Low back pain, unspecified: Secondary | ICD-10-CM

## 2011-07-14 DIAGNOSIS — E119 Type 2 diabetes mellitus without complications: Secondary | ICD-10-CM | POA: Insufficient documentation

## 2011-07-14 DIAGNOSIS — Z79899 Other long term (current) drug therapy: Secondary | ICD-10-CM | POA: Insufficient documentation

## 2011-07-14 DIAGNOSIS — E669 Obesity, unspecified: Secondary | ICD-10-CM

## 2011-07-14 DIAGNOSIS — Z7982 Long term (current) use of aspirin: Secondary | ICD-10-CM | POA: Insufficient documentation

## 2011-07-14 DIAGNOSIS — Z90710 Acquired absence of both cervix and uterus: Secondary | ICD-10-CM | POA: Insufficient documentation

## 2011-07-14 LAB — POCT I-STAT 4, (NA,K, GLUC, HGB,HCT)
Glucose, Bld: 61 mg/dL — ABNORMAL LOW (ref 70–99)
HCT: 34 % — ABNORMAL LOW (ref 36.0–46.0)
Hemoglobin: 11.6 g/dL — ABNORMAL LOW (ref 12.0–15.0)
Potassium: 3.3 mEq/L — ABNORMAL LOW (ref 3.5–5.1)
Sodium: 142 mEq/L (ref 135–145)

## 2011-07-14 LAB — CBC
HCT: 32.8 % — ABNORMAL LOW (ref 36.0–46.0)
Hemoglobin: 11 g/dL — ABNORMAL LOW (ref 12.0–15.0)
MCH: 31.4 pg (ref 26.0–34.0)
MCHC: 33.5 g/dL (ref 30.0–36.0)
MCV: 93.7 fL (ref 78.0–100.0)
Platelets: 216 10*3/uL (ref 150–400)
RBC: 3.5 MIL/uL — ABNORMAL LOW (ref 3.87–5.11)
RDW: 17.6 % — ABNORMAL HIGH (ref 11.5–15.5)
WBC: 5.1 10*3/uL (ref 4.0–10.5)

## 2011-07-14 LAB — GLUCOSE, CAPILLARY: Glucose-Capillary: 126 mg/dL — ABNORMAL HIGH (ref 70–99)

## 2011-07-14 SURGERY — SALPINGO-OOPHORECTOMY, LAPAROSCOPIC
Anesthesia: General | Site: Abdomen | Laterality: Right | Wound class: Clean

## 2011-07-14 MED ORDER — LACTATED RINGERS IV SOLN: INTRAVENOUS | Status: DC

## 2011-07-14 MED ORDER — CEFAZOLIN SODIUM 1-5 GM-% IV SOLN
1.0000 g | INTRAVENOUS | Status: AC
  Administered 2011-07-14: 1 g via INTRAVENOUS

## 2011-07-14 MED ORDER — LACTATED RINGERS IV SOLN
INTRAVENOUS | Status: DC
  Administered 2011-07-14: 100 mL/h via INTRAVENOUS

## 2011-07-14 MED ORDER — PROMETHAZINE HCL 25 MG/ML IJ SOLN: 6.2500 mg | INTRAMUSCULAR | Status: DC | PRN

## 2011-07-14 MED ORDER — ONDANSETRON HCL 4 MG/2ML IJ SOLN
INTRAMUSCULAR | Status: DC | PRN
  Administered 2011-07-14: 4 mg via INTRAVENOUS

## 2011-07-14 MED ORDER — MIDAZOLAM HCL 5 MG/5ML IJ SOLN
INTRAMUSCULAR | Status: DC | PRN
  Administered 2011-07-14: 2 mg via INTRAVENOUS

## 2011-07-14 MED ORDER — LIDOCAINE HCL (CARDIAC) 20 MG/ML IV SOLN
INTRAVENOUS | Status: DC | PRN
  Administered 2011-07-14: 80 mg via INTRAVENOUS

## 2011-07-14 MED ORDER — PROPOFOL 10 MG/ML IV EMUL
INTRAVENOUS | Status: DC | PRN
  Administered 2011-07-14: 160 mg via INTRAVENOUS

## 2011-07-14 MED ORDER — FENTANYL CITRATE 0.05 MG/ML IJ SOLN
INTRAMUSCULAR | Status: DC | PRN
  Administered 2011-07-14 (×2): 50 ug via INTRAVENOUS
  Administered 2011-07-14: 100 ug via INTRAVENOUS

## 2011-07-14 MED ORDER — LACTATED RINGERS IR SOLN
Status: DC | PRN
  Administered 2011-07-14: 3000 mL

## 2011-07-14 MED ORDER — HEPARIN SOD (PORK) LOCK FLUSH 100 UNIT/ML IV SOLN
INTRAVENOUS | Status: DC | PRN
  Administered 2011-07-14: 500 [IU]

## 2011-07-14 MED ORDER — GLYCOPYRROLATE 0.2 MG/ML IJ SOLN
INTRAMUSCULAR | Status: DC | PRN
  Administered 2011-07-14: 0.6 mg via INTRAVENOUS

## 2011-07-14 MED ORDER — ROCURONIUM BROMIDE 100 MG/10ML IV SOLN
INTRAVENOUS | Status: DC | PRN
  Administered 2011-07-14: 35 mg via INTRAVENOUS

## 2011-07-14 MED ORDER — BUPIVACAINE HCL (PF) 0.25 % IJ SOLN
INTRAMUSCULAR | Status: DC | PRN
  Administered 2011-07-14: 10 mL

## 2011-07-14 MED ORDER — NEOSTIGMINE METHYLSULFATE 1 MG/ML IJ SOLN
INTRAMUSCULAR | Status: DC | PRN
  Administered 2011-07-14: 1 mg via INTRAVENOUS
  Administered 2011-07-14: 3.5 mg via INTRAVENOUS

## 2011-07-14 MED ORDER — FENTANYL CITRATE 0.05 MG/ML IJ SOLN
25.0000 ug | INTRAMUSCULAR | Status: DC | PRN
  Administered 2011-07-14: 25 ug via INTRAVENOUS

## 2011-07-14 SURGICAL SUPPLY — 64 items
ADH SKN CLS APL DERMABOND .7 (GAUZE/BANDAGES/DRESSINGS)
APPLICATOR COTTON TIP 6IN STRL (MISCELLANEOUS) ×2 IMPLANT
BAG SPEC RTRVL LRG 6X4 10 (ENDOMECHANICALS) ×1
BAG URINE LEG 500ML (DRAIN) ×1 IMPLANT
BANDAGE ADHESIVE 1X3 (GAUZE/BANDAGES/DRESSINGS) IMPLANT
BLADE SURG 15 STRL LF DISP TIS (BLADE) ×1 IMPLANT
BLADE SURG 15 STRL SS (BLADE) ×2
BLADE SURG ROTATE 9660 (MISCELLANEOUS) IMPLANT
CANISTER SUCTION 2500CC (MISCELLANEOUS) IMPLANT
CATH FOLEY 2WAY SLVR  5CC 14FR (CATHETERS)
CATH FOLEY 2WAY SLVR 5CC 14FR (CATHETERS) ×1 IMPLANT
CATH ROBINSON RED A/P 16FR (CATHETERS) ×2 IMPLANT
CLOSURE STERI-STRIP 1/4X4 (GAUZE/BANDAGES/DRESSINGS) ×1 IMPLANT
CLOTH BEACON ORANGE TIMEOUT ST (SAFETY) ×2 IMPLANT
COVER TABLE BACK 60X90 (DRAPES) ×2 IMPLANT
DERMABOND ADVANCED (GAUZE/BANDAGES/DRESSINGS)
DERMABOND ADVANCED .7 DNX12 (GAUZE/BANDAGES/DRESSINGS) IMPLANT
DRAPE CAMERA CLOSED 9X96 (DRAPES) ×2 IMPLANT
DRAPE UNDERBUTTOCKS STRL (DRAPE) ×2 IMPLANT
DRSG TEGADERM 2-3/8X2-3/4 SM (GAUZE/BANDAGES/DRESSINGS) IMPLANT
ELECT REM PT RETURN 9FT ADLT (ELECTROSURGICAL) ×2
ELECTRODE REM PT RTRN 9FT ADLT (ELECTROSURGICAL) ×1 IMPLANT
FILTER SMOKE EVAC LAPAROSHD (FILTER) IMPLANT
FORCEPS BIOP CUTTING (INSTRUMENTS) IMPLANT
GAUZE SPONGE 4X4 12PLY STRL LF (GAUZE/BANDAGES/DRESSINGS) ×1 IMPLANT
GLOVE BIO SURGEON STRL SZ7.5 (GLOVE) ×4 IMPLANT
GLOVE ECLIPSE 6.0 STRL STRAW (GLOVE) ×1 IMPLANT
GLOVE INDICATOR 6.5 STRL GRN (GLOVE) ×1 IMPLANT
GOWN PREVENTION PLUS LG XLONG (DISPOSABLE) ×4 IMPLANT
GOWN PREVENTION PLUS XLARGE (GOWN DISPOSABLE) ×2 IMPLANT
NDL HYPO 25X1 1.5 SAFETY (NEEDLE) ×1 IMPLANT
NDL INSUFFLATION 14GA 120MM (NEEDLE) ×1 IMPLANT
NEEDLE HYPO 25X1 1.5 SAFETY (NEEDLE) ×2 IMPLANT
NEEDLE INSUFFLATION 14GA 120MM (NEEDLE) ×2 IMPLANT
NS IRRIG 1000ML POUR BTL (IV SOLUTION) ×1 IMPLANT
NS IRRIG 500ML POUR BTL (IV SOLUTION) ×2 IMPLANT
PACK BASIN DAY SURGERY FS (CUSTOM PROCEDURE TRAY) ×2 IMPLANT
PAD OB MATERNITY 4.3X12.25 (PERSONAL CARE ITEMS) ×2 IMPLANT
PAD PREP 24X48 CUFFED NSTRL (MISCELLANEOUS) ×2 IMPLANT
PENCIL BUTTON HOLSTER BLD 10FT (ELECTRODE) IMPLANT
POUCH SPECIMEN RETRIEVAL 10MM (ENDOMECHANICALS) ×1 IMPLANT
SCISSORS LAP 5X35 DISP (ENDOMECHANICALS) IMPLANT
SEALER TISSUE G2 CVD JAW 35 (ENDOMECHANICALS) IMPLANT
SEALER TISSUE G2 CVD JAW 45CM (ENDOMECHANICALS) IMPLANT
SET IRRIG TUBING LAPAROSCOPIC (IRRIGATION / IRRIGATOR) ×1 IMPLANT
SHEET LAVH (DRAPES) ×2 IMPLANT
SOLUTION ANTI FOG 6CC (MISCELLANEOUS) ×2 IMPLANT
SPONGE GAUZE 2X2 8PLY STRL LF (GAUZE/BANDAGES/DRESSINGS) IMPLANT
SPONGE GAUZE 4X4 12PLY (GAUZE/BANDAGES/DRESSINGS) IMPLANT
STRIP CLOSURE SKIN 1/4X3 (GAUZE/BANDAGES/DRESSINGS) ×1 IMPLANT
STRIP CLOSURE SKIN 1/4X4 (GAUZE/BANDAGES/DRESSINGS) ×2 IMPLANT
SUT VIC AB 3-0 PS2 18 (SUTURE) ×2
SUT VIC AB 3-0 PS2 18XBRD (SUTURE) ×1 IMPLANT
SUT VICRYL 0 UR6 27IN ABS (SUTURE) IMPLANT
SYR 3ML 23GX1 SAFETY (SYRINGE) IMPLANT
SYR CONTROL 10ML LL (SYRINGE) ×2 IMPLANT
SYRINGE 10CC LL (SYRINGE) ×2 IMPLANT
TAPE CLOTH SURG 4X10 WHT LF (GAUZE/BANDAGES/DRESSINGS) ×1 IMPLANT
TOWEL OR 17X24 6PK STRL BLUE (TOWEL DISPOSABLE) ×4 IMPLANT
TRAY DSU PREP LF (CUSTOM PROCEDURE TRAY) ×2 IMPLANT
TROCAR Z-THREAD BLADED 11X100M (TROCAR) ×3 IMPLANT
TROCAR Z-THREAD BLADED 5X100MM (TROCAR) ×3 IMPLANT
TUBING INSUFFLATION W/FILTER (TUBING) ×2 IMPLANT
WATER STERILE IRR 500ML POUR (IV SOLUTION) ×2 IMPLANT

## 2011-07-14 NOTE — Transfer of Care (Signed)
Immediate Anesthesia Transfer of Care Note  Patient: [name]  Procedure(s) Performed: Procedure(s) (LRB): LAPAROSCOPIC SALPINGO OOPHERECTOMY (Right)  Patient Location: PACU  Anesthesia Type: General  Level of Consciousness: sedated  Airway & Oxygen Therapy: Patient Spontanous Breathing and Patient connected to nasal cannula oxygen  Post-op Assessment: Report given to PACU RN  Post vital signs: stable  Complications: No apparent anesthesia complications

## 2011-07-14 NOTE — Anesthesia Postprocedure Evaluation (Signed)
Anesthesia Post Note  Patient: [name]  Procedure(s) Performed: Procedure(s) (LRB): LAPAROSCOPIC SALPINGO OOPHERECTOMY (Right)  Anesthesia type: General  Patient location: PACU  Post pain: Pain level controlled  Post assessment: Post-op Vital signs reviewed  Last Vitals:  Filed Vitals:   07/14/11 0900  BP: 130/73  Pulse: 66  Temp:   Resp: 12    Post vital signs: Reviewed  Level of consciousness: sedated  Complications: No apparent anesthesia complications

## 2011-07-14 NOTE — Anesthesia Procedure Notes (Signed)
Procedure Name: Intubation Performed by: Briant Sites Pre-anesthesia Checklist: Patient identified, Emergency Drugs available, Suction available and Patient being monitored Patient Re-evaluated:Patient Re-evaluated prior to inductionOxygen Delivery Method: Circle System Utilized Preoxygenation: Pre-oxygenation with 100% oxygen Intubation Type: IV induction Ventilation: Mask ventilation without difficulty Laryngoscope Size: Mac and 3 Grade View: Grade II Tube type: Oral Tube size: 7.0 mm Number of attempts: 1 Airway Equipment and Method: stylet and oral airway Placement Confirmation: ETT inserted through vocal cords under direct vision,  positive ETCO2 and breath sounds checked- equal and bilateral Secured at: 21 cm Tube secured with: Tape Dental Injury: Teeth and Oropharynx as per pre-operative assessment

## 2011-07-14 NOTE — Anesthesia Preprocedure Evaluation (Addendum)
Anesthesia Evaluation  Patient identified by MRN, date of birth, ID band Patient awake  General Assessment Comment:History Right breast cancer; s/p mastectomy with lymph node resection  Reviewed: Allergy & Precautions, H&P , NPO status , Patient's Chart, lab work & pertinent test results  History of Anesthesia Complications Negative for: history of anesthetic complications  Airway Mallampati: III TM Distance: >3 FB Neck ROM: Full    Dental  (+) Teeth Intact and Dental Advisory Given   Pulmonary neg pulmonary ROS,  breath sounds clear to auscultation  Pulmonary exam normal       Cardiovascular hypertension, Pt. on medications negative cardio ROS  Rate:Normal     Neuro/Psych negative neurological ROS  negative psych ROS   GI/Hepatic negative GI ROS, Neg liver ROS, GERD-  Medicated,  Endo/Other  negative endocrine ROSDiabetes mellitus-, Well Controlled, Type 2, Oral Hypoglycemic AgentsMorbid obesity  Renal/GU negative Renal ROS  negative genitourinary   Musculoskeletal negative musculoskeletal ROS (+)   Abdominal (+) + obese,   Peds  Hematology negative hematology ROS (+)   Anesthesia Other Findings   Reproductive/Obstetrics negative OB ROS                           Anesthesia Physical Anesthesia Plan  ASA: III  Anesthesia Plan: General   Post-op Pain Management:    Induction: Intravenous  Airway Management Planned: Oral ETT  Additional Equipment:   Intra-op Plan:   Post-operative Plan: Extubation in OR  Informed Consent:   Dental advisory given  Plan Discussed with: CRNA  Anesthesia Plan Comments: (No BP or IV in RUE)       Anesthesia Quick Evaluation

## 2011-07-14 NOTE — Progress Notes (Signed)
07/11/11  NARRATIVE: The patient was laid in the correct position on the treatment table for simulation verification. Portal imaging was obtained and I verified the fields and MLCs to be accurate. The patient tolerated the procedure well.  

## 2011-07-15 ENCOUNTER — Ambulatory Visit
Admission: RE | Admit: 2011-07-15 | Discharge: 2011-07-15 | Disposition: A | Discharge status: Home / Self Care | Payer: Self-pay | Source: Ambulatory Visit | Attending: Radiation Oncology | Admitting: Radiation Oncology

## 2011-07-15 NOTE — Op Note (Signed)
[name], [name]             ACCOUNT NO.:  [id number]  MEDICAL RECORD NO.:  [id number]  LOCATION:                               FACILITY:  [facility]  PHYSICIAN:  Freddy Finner, M.D.   DATE OF BIRTH:  June 14, 1945  DATE OF PROCEDURE: DATE OF DISCHARGE:                              OPERATIVE REPORT   PREOPERATIVE DIAGNOSES: 1. The patient has recently diagnosed breast cancer, with breast     cancer antigen gene positive lesion. 2. She has a residual ovary, which makes her at significant increased     risk for ovarian cancer.  POSTOPERATIVE DIAGNOSES: 1. The patient has recently diagnosed breast cancer, with breast     cancer antigen gene positive lesion. 2. She has a residual ovary, which makes her at significant increased     risk for ovarian cancer. 3. Normal appearance of ovary except for periovarian adhesions and     intra-abdominal adhesions.  OPERATIVE PROCEDURE:  Laparoscopy with laparoscopic right salpingo- oophorectomy and lysis of pelvic and abdominal adhesions.  ANESTHESIA:  General endotracheal.  ESTIMATED INTRAOPERATIVE BLOOD LOSS:  Less than 20 cc.  INTRAOPERATIVE COMPLICATIONS:  None.  Details of the present illness are recorded in the admission note. Briefly, the patient was referred by her oncologist, Dr. Darnelle Catalan because of her breast cancer and a positive BRCA gene.  She was admitted on the morning of surgery.  She was given a bolus of Ancef.  She was placed in serial compression devices to the lower extremities.  DESCRIPTION OF PROCEDURE:  She was taken to the operating room, there placed under adequate general endotracheal anesthesia.  The abdomen was prepped in the usual fashion.  Bladder was evacuated using sterile technique.  The sponge forceps with a sponge at the tip was left in the vagina for the potential use during the procedure for traction and for identification.  Sterile drapes were applied.  Two incisions were made, one at the umbilicus  and one approximately 3 cm above the symphysis in the midline.  The anterior abdominal wall was elevated manually and disposable Veress needle introduced at the umbilicus.  __________.  A 10/11 disposable trocar was introduced in the umbilicus and the laparoscope was placed.  Entry was without injury.  Scanning inspection of the abdomen and pelvis was carried out.  Numerous adhesions were noted in the upper abdomen with no blind loops.  There was an adhesion to the anterior abdominal wall, which was lysed with Endoshears to allow good exposure of the pelvis and to remove __________ level.  Please note, the peritoneal washings were taken prior to the dissection and were submitted separately.  The right tube and ovary were visualized. The left tube and ovary were absent and there were filmy adhesions of the left adnexa to the ileum and to the cecum.  These were carefully lysed either with Endoshears or with  Ethicon tripolar device.  The area was completely freed out.  Please note that the second trocar was placed for use during the procedure through the lower incision, this too was a 10/11.  The infundibulopelvic ligament was progressively sealed and divided with the Ethicon tripolar device.  After completely freeing the ovary,  an endobag was introduced in the lower trocar sleeve and the ovary captured. A Tresa Endo was used to open the fascia at the lower incision after removing the trocar sleeve to allow passage of the ovary intact and in the bag.  The __________ system was used to irrigate and inspect the pelvis and lower abdomen.  All bleeding sources were entirely controlled.  Hemostasis was noted to be adequate.  The irrigating solution was aspirated.  The gas was allowed to escape.  The lower incision was closed in a double layer with the 0-Vicryl suture on a UR6 needle and the skin approximated with interrupted 3-0 Vicryl sutures at both incisions.  Steri-Strips were applied to the  lower incision and a compression sterile dressing was applied at the umbilicus.  The patient was awakened, taken to the recovery room in good condition.  She will be discharged in the immediate postop period for followup in the office in approximately 2 weeks.  She is given routine surgical instructions.  She has pain medication on hand from her previous breast surgery, which she plans to use as needed  for the postoperative pain.     Freddy Finner, M.D.     WRN/MEDQ  D:  07/14/2011  T:  07/14/2011  Job:  [id number]  cc:   Lowella Dell, M.D. Fax: [phone]

## 2011-07-16 ENCOUNTER — Ambulatory Visit
Admission: RE | Admit: 2011-07-16 | Discharge: 2011-07-16 | Disposition: A | Discharge status: Home / Self Care | Payer: Self-pay | Source: Ambulatory Visit | Attending: Radiation Oncology | Admitting: Radiation Oncology

## 2011-07-17 ENCOUNTER — Ambulatory Visit
Admission: RE | Admit: 2011-07-17 | Discharge: 2011-07-17 | Disposition: A | Discharge status: Home / Self Care | Payer: Self-pay | Source: Ambulatory Visit | Attending: Radiation Oncology | Admitting: Radiation Oncology

## 2011-07-18 ENCOUNTER — Ambulatory Visit
Admission: RE | Admit: 2011-07-18 | Discharge: 2011-07-18 | Disposition: A | Discharge status: Home / Self Care | Payer: Self-pay | Source: Ambulatory Visit | Attending: Radiation Oncology | Admitting: Radiation Oncology

## 2011-07-21 ENCOUNTER — Ambulatory Visit
Admission: RE | Admit: 2011-07-21 | Discharge: 2011-07-21 | Disposition: A | Discharge status: Home / Self Care | Payer: Self-pay | Source: Ambulatory Visit | Attending: Radiation Oncology | Admitting: Radiation Oncology

## 2011-07-21 ENCOUNTER — Encounter: Payer: Self-pay | Admitting: Radiation Oncology

## 2011-07-21 VITALS — BP 120/69 | HR 90 | Temp 98.1°F | Wt 156.2 lb

## 2011-07-21 DIAGNOSIS — C50119 Malignant neoplasm of central portion of unspecified female breast: Secondary | ICD-10-CM

## 2011-07-21 NOTE — Progress Notes (Signed)
   Weekly Management Note Current Dose: 1000  cGy  Projected Dose:  6000cGy   Narrative:  The patient presents for routine under treatment assessment.  CBCT/MVCT images/Port film x-rays were reviewed.  The chart was checked. She started developing some nausea last week. She just found her Compazine yesterday and started taking it. It is too soon to tell if it is helping. No vomiting.  Physical Findings: Weight: 156 lb 3.2 oz (70.852 kg). No discernible skin reaction thus far  Impression:  The patient is tolerating radiotherapy. I reviewed her plan and a small amount of her liver is getting some radiation dose which is typical of right-sided breast cancers. It would be unusual for this to cause nausea but it is possible. I discussed this with her.  Plan:  Continue radiotherapy as planned. I advised her to take the Compazine and to let me know if that is not sufficient.

## 2011-07-21 NOTE — Progress Notes (Signed)
Denies any pain in treatment field.   C/o nausea without vomiting for the past week.  Can't complete meals due to nausea.  "I ate a little bit" this morning.  Compazine for nausea.  Skin intact in tx field.

## 2011-07-22 ENCOUNTER — Ambulatory Visit
Admission: RE | Admit: 2011-07-22 | Discharge: 2011-07-22 | Disposition: A | Discharge status: Home / Self Care | Payer: Self-pay | Source: Ambulatory Visit | Attending: Radiation Oncology | Admitting: Radiation Oncology

## 2011-07-23 ENCOUNTER — Ambulatory Visit
Admission: RE | Admit: 2011-07-23 | Discharge: 2011-07-23 | Disposition: A | Discharge status: Home / Self Care | Payer: Self-pay | Source: Ambulatory Visit | Attending: Radiation Oncology | Admitting: Radiation Oncology

## 2011-07-24 ENCOUNTER — Ambulatory Visit
Admission: RE | Admit: 2011-07-24 | Discharge: 2011-07-24 | Disposition: A | Discharge status: Home / Self Care | Payer: Self-pay | Source: Ambulatory Visit | Attending: Radiation Oncology | Admitting: Radiation Oncology

## 2011-07-25 ENCOUNTER — Ambulatory Visit
Admission: RE | Admit: 2011-07-25 | Discharge: 2011-07-25 | Disposition: A | Discharge status: Home / Self Care | Payer: Self-pay | Source: Ambulatory Visit | Attending: Radiation Oncology | Admitting: Radiation Oncology

## 2011-07-28 ENCOUNTER — Ambulatory Visit
Admission: RE | Admit: 2011-07-28 | Discharge: 2011-07-28 | Disposition: A | Discharge status: Home / Self Care | Payer: Self-pay | Source: Ambulatory Visit | Attending: Radiation Oncology | Admitting: Radiation Oncology

## 2011-07-28 ENCOUNTER — Encounter: Payer: Self-pay | Admitting: Radiation Oncology

## 2011-07-28 VITALS — BP 133/75 | HR 97 | Temp 98.1°F | Wt 153.8 lb

## 2011-07-28 DIAGNOSIS — C50119 Malignant neoplasm of central portion of unspecified female breast: Secondary | ICD-10-CM

## 2011-07-28 NOTE — Progress Notes (Signed)
   Weekly Management Note Current Dose:   2000cGy  Projected Dose: 6000 cGy   Narrative:  The patient presents for routine under treatment assessment.  CBCT/MVCT images/Port film x-rays were reviewed.  The chart was checked. She continues to have nausea. She spoke with her surgeon who believes the nausea may be postoperative from her bilateral oophorectomy. She is going to see him tomorrow  Physical Findings: Weight: 153 lb 12.8 oz (69.763 kg). She has early hyperpigmentation over her right chest wall.  Impression:  The patient is tolerating radiotherapy.  Plan:  Continue radiotherapy as planned.

## 2011-07-28 NOTE — Progress Notes (Signed)
10th fraction to right chest wall.  Denies any pain.  Hyperpigmentation noted.  Skin intact and without dryness.  No evidence of desquamation.  Taking naps, but this causes difficulty sleeping during the night.

## 2011-07-29 ENCOUNTER — Ambulatory Visit
Admission: RE | Admit: 2011-07-29 | Discharge: 2011-07-29 | Disposition: A | Discharge status: Home / Self Care | Payer: Self-pay | Source: Ambulatory Visit | Attending: Radiation Oncology | Admitting: Radiation Oncology

## 2011-07-30 ENCOUNTER — Ambulatory Visit
Admission: RE | Admit: 2011-07-30 | Discharge: 2011-07-30 | Disposition: A | Discharge status: Home / Self Care | Payer: Self-pay | Source: Ambulatory Visit | Attending: Radiation Oncology | Admitting: Radiation Oncology

## 2011-07-31 ENCOUNTER — Ambulatory Visit
Admission: RE | Admit: 2011-07-31 | Discharge: 2011-07-31 | Disposition: A | Discharge status: Home / Self Care | Payer: Self-pay | Source: Ambulatory Visit | Attending: Radiation Oncology | Admitting: Radiation Oncology

## 2011-08-01 ENCOUNTER — Ambulatory Visit
Admission: RE | Admit: 2011-08-01 | Discharge: 2011-08-01 | Disposition: A | Discharge status: Home / Self Care | Payer: Self-pay | Source: Ambulatory Visit | Attending: Radiation Oncology | Admitting: Radiation Oncology

## 2011-08-04 ENCOUNTER — Ambulatory Visit
Admission: RE | Admit: 2011-08-04 | Discharge: 2011-08-04 | Disposition: A | Discharge status: Home / Self Care | Payer: Self-pay | Source: Ambulatory Visit | Attending: Radiation Oncology | Admitting: Radiation Oncology

## 2011-08-04 ENCOUNTER — Encounter: Payer: Self-pay | Admitting: Radiation Oncology

## 2011-08-04 VITALS — BP 159/91 | HR 90 | Resp 18 | Wt 156.0 lb

## 2011-08-04 DIAGNOSIS — C50119 Malignant neoplasm of central portion of unspecified female breast: Secondary | ICD-10-CM

## 2011-08-04 DIAGNOSIS — R110 Nausea: Secondary | ICD-10-CM

## 2011-08-04 DIAGNOSIS — C50919 Malignant neoplasm of unspecified site of unspecified female breast: Secondary | ICD-10-CM

## 2011-08-04 MED ORDER — PROMETHAZINE HCL 25 MG PO TABS: 25.0000 mg | ORAL_TABLET | Freq: Four times a day (QID) | ORAL | Status: DC | PRN

## 2011-08-04 MED ORDER — RADIAPLEXRX EX GEL
Freq: Once | CUTANEOUS | Status: AC
  Administered 2011-08-04: 11:00:00 via TOPICAL

## 2011-08-04 NOTE — Progress Notes (Signed)
   Weekly Management Note, right breast cancer Current Dose:   3000 cGy  Projected Dose: 6000  cGy   Narrative:  The patient presents for routine under treatment assessment.  CBCT/MVCT images/Port film x-rays were reviewed.  The chart was checked. She is doing relatively well but she still is nausea. She did not get an antiemetic from her surgeon as her nausea was dissipating at her appointment with her surgeon last week. However, the nausea persists  Physical Findings: Weight: 156 lb (70.761 kg). She has some erythema/hyperpigmentation over the right chest wall. The skin is intact.  Impression:  The patient is tolerating radiotherapy.  Plan:  Continue radiotherapy as planned. I'm giving her a prescription today for Phenergan. She will let me know if it is not satisfactory.

## 2011-08-04 NOTE — Progress Notes (Signed)
Patient presents to the clinic today unaccompanied for under treat visit with Dr. Basilio Cairo. Patient is alert and oriented to person, place, and time. No distress noted. Steady gait noted. Pleasant affect noted. Patient denies pain at this time. Patient reports she feels well today since she was "able to sleep the majority of the weekend." Patient reports the skin of her right chest wall is dark without breaks. Patient reports using Radiaplex twice daily. Provided patient with an additional tube of Radiaplex today. Reported all findings to Dr. Basilio Cairo.

## 2011-08-05 ENCOUNTER — Ambulatory Visit
Admission: RE | Admit: 2011-08-05 | Discharge: 2011-08-05 | Disposition: A | Discharge status: Home / Self Care | Payer: Self-pay | Source: Ambulatory Visit | Attending: Radiation Oncology | Admitting: Radiation Oncology

## 2011-08-06 ENCOUNTER — Ambulatory Visit
Admission: RE | Admit: 2011-08-06 | Discharge: 2011-08-06 | Disposition: A | Discharge status: Home / Self Care | Payer: Self-pay | Source: Ambulatory Visit | Attending: Radiation Oncology | Admitting: Radiation Oncology

## 2011-08-07 ENCOUNTER — Ambulatory Visit
Admission: RE | Admit: 2011-08-07 | Discharge: 2011-08-07 | Disposition: A | Discharge status: Home / Self Care | Payer: Self-pay | Source: Ambulatory Visit | Attending: Radiation Oncology | Admitting: Radiation Oncology

## 2011-08-08 ENCOUNTER — Ambulatory Visit
Admission: RE | Admit: 2011-08-08 | Discharge: 2011-08-08 | Disposition: A | Discharge status: Home / Self Care | Payer: Self-pay | Source: Ambulatory Visit | Attending: Radiation Oncology | Admitting: Radiation Oncology

## 2011-08-11 ENCOUNTER — Ambulatory Visit
Admission: RE | Admit: 2011-08-11 | Discharge: 2011-08-11 | Disposition: A | Discharge status: Home / Self Care | Payer: Self-pay | Source: Ambulatory Visit | Attending: Radiation Oncology | Admitting: Radiation Oncology

## 2011-08-11 ENCOUNTER — Encounter: Payer: Self-pay | Admitting: Radiation Oncology

## 2011-08-11 DIAGNOSIS — C50119 Malignant neoplasm of central portion of unspecified female breast: Secondary | ICD-10-CM

## 2011-08-11 NOTE — Progress Notes (Signed)
HERE TODAY FOR PUT.  HAS BEEN HAVING TROUBLE WITH NAUSEA BUT THE MEDICATION YOU GAVE HER HAS HELPED THIS TO SUBSIDE.  SKIN IS DARKER IN COLOR WITH NO DESQUAMATION NOTED. USING RADIAPLEX

## 2011-08-11 NOTE — Progress Notes (Signed)
   Weekly Management Note Current Dose: 4000  cGy  Projected Dose: 6000 cGy   Narrative:  The patient presents for routine under treatment assessment.  CBCT/MVCT images/Port film x-rays were reviewed.  The chart was checked. She reports that her nausea has improved with Phenergan. She has no other complaints today  Physical Findings: Weight:  .  her weight is 156 pounds. She has diffuse hyperpigmentation over the right chest wall. There is no desquamation.  Impression:  The patient is tolerating radiotherapy.  Plan:  Continue radiotherapy as planned.

## 2011-08-12 ENCOUNTER — Ambulatory Visit
Admission: RE | Admit: 2011-08-12 | Discharge: 2011-08-12 | Disposition: A | Discharge status: Home / Self Care | Payer: Self-pay | Source: Ambulatory Visit | Attending: Radiation Oncology | Admitting: Radiation Oncology

## 2011-08-13 ENCOUNTER — Ambulatory Visit
Admission: RE | Admit: 2011-08-13 | Discharge: 2011-08-13 | Disposition: A | Discharge status: Home / Self Care | Payer: Self-pay | Source: Ambulatory Visit | Attending: Radiation Oncology | Admitting: Radiation Oncology

## 2011-08-14 ENCOUNTER — Ambulatory Visit: Payer: Self-pay

## 2011-08-15 ENCOUNTER — Other Ambulatory Visit: Payer: Self-pay | Admitting: Internal Medicine

## 2011-08-15 ENCOUNTER — Ambulatory Visit
Admission: RE | Admit: 2011-08-15 | Discharge: 2011-08-15 | Disposition: A | Discharge status: Home / Self Care | Payer: Self-pay | Source: Ambulatory Visit | Attending: Radiation Oncology | Admitting: Radiation Oncology

## 2011-08-18 ENCOUNTER — Ambulatory Visit
Admission: RE | Admit: 2011-08-18 | Discharge: 2011-08-18 | Disposition: A | Discharge status: Home / Self Care | Payer: Self-pay | Source: Ambulatory Visit | Attending: Radiation Oncology | Admitting: Radiation Oncology

## 2011-08-18 ENCOUNTER — Encounter: Payer: Self-pay | Admitting: Radiation Oncology

## 2011-08-18 DIAGNOSIS — C50119 Malignant neoplasm of central portion of unspecified female breast: Secondary | ICD-10-CM

## 2011-08-18 MED ORDER — BIAFINE EX EMUL
Freq: Two times a day (BID) | CUTANEOUS | Status: DC
  Administered 2011-08-18: 13:00:00 via TOPICAL

## 2011-08-18 NOTE — Progress Notes (Signed)
   Weekly Management Note, right breast cancer Current Dose:   4800 cGy  Projected Dose: 6000 cGy   Narrative:  The patient presents for routine under treatment assessment.  CBCT/MVCT images/Port film x-rays were reviewed.  The chart was checked. She is doing fairly well. She has noticed a little bit of peeling over her low neck  Physical Findings: Weight:  . She is in no acute distress. She has diffuse hyperpigmentation throughout the right chest wall. There is hyperpigmentation and some dry desquamation over the supraclavicular area on the right  Impression:  The patient is tolerating radiotherapy.  Plan:  Continue radiotherapy as planned.

## 2011-08-19 ENCOUNTER — Ambulatory Visit
Admission: RE | Admit: 2011-08-19 | Discharge: 2011-08-19 | Disposition: A | Discharge status: Home / Self Care | Payer: Self-pay | Source: Ambulatory Visit | Attending: Radiation Oncology | Admitting: Radiation Oncology

## 2011-08-20 ENCOUNTER — Ambulatory Visit
Admission: RE | Admit: 2011-08-20 | Discharge: 2011-08-20 | Disposition: A | Discharge status: Home / Self Care | Payer: Self-pay | Source: Ambulatory Visit | Attending: Radiation Oncology | Admitting: Radiation Oncology

## 2011-08-21 ENCOUNTER — Ambulatory Visit
Admission: RE | Admit: 2011-08-21 | Discharge: 2011-08-21 | Disposition: A | Discharge status: Home / Self Care | Payer: Self-pay | Source: Ambulatory Visit | Attending: Radiation Oncology | Admitting: Radiation Oncology

## 2011-08-22 ENCOUNTER — Ambulatory Visit
Admission: RE | Admit: 2011-08-22 | Discharge: 2011-08-22 | Disposition: A | Discharge status: Home / Self Care | Payer: Self-pay | Source: Ambulatory Visit | Attending: Radiation Oncology | Admitting: Radiation Oncology

## 2011-08-25 ENCOUNTER — Ambulatory Visit
Admission: RE | Admit: 2011-08-25 | Discharge: 2011-08-25 | Disposition: A | Discharge status: Home / Self Care | Payer: Self-pay | Source: Ambulatory Visit | Attending: Radiation Oncology | Admitting: Radiation Oncology

## 2011-08-25 ENCOUNTER — Encounter: Payer: Self-pay | Admitting: Radiation Oncology

## 2011-08-25 VITALS — BP 140/87 | HR 75 | Resp 18 | Wt 157.0 lb

## 2011-08-25 DIAGNOSIS — C50119 Malignant neoplasm of central portion of unspecified female breast: Secondary | ICD-10-CM

## 2011-08-25 NOTE — Progress Notes (Signed)
Patient presents to the clinic today unaccompanied for under treat visit with Dr. Basilio Cairo. Patient is alert and oriented to person, place, and time. No distress noted. Steady gait noted. Pleasant affect noted. Patient denies pain at this time. Patient reports her energy level is "good." Patient remain active around the house preparing for spring. 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 Patient reports she has no appetite. Patient reports she will get food then the smell and taste of the food make her not want it. Patient reports supplementing with Glucerna. Patient's weight stable. Patient reports hyperpigmentation of treatment field. Patient reports using Radiaplex as directed. Reported all findings to Dr. Basilio Cairo.

## 2011-08-25 NOTE — Progress Notes (Signed)
   RIGHT BREAST CANCER Weekly Management Note Current Dose:  5800 cGy  Projected Dose: 6000 cGy   Narrative:  The patient presents for routine under treatment assessment.  CBCT/MVCT images/Port film x-rays were reviewed.  The chart was checked. She continues to have nausea. She'll undergo lab tests and followup with medical oncology at the end of April. Her skin is holding up fairly well. Her energy is good.  Physical Findings: Weight: 157 lb (71.215 kg). The patient has diffuse hyperpigmentation throughout the chest wall on the right. This is also over the supraclavicular area. There is dry but not moist desquamation.  Impression:  The patient is tolerating radiotherapy.  Plan:  Continue radiotherapy as planned. I will CC this note to medical oncology in light of her nausea, now present for several weeks. Initially her surgeon thought it was postoperative after she underwent oophorectomy.  Etiology is unclear. Perhaps her lab work will provide more guidance. Occasionally, nausea can occur from scatter dose to the anterior liver during breast radiotherapy, but this isn't typical.  I will followup with the patient in one month.

## 2011-08-26 ENCOUNTER — Ambulatory Visit: Payer: Self-pay

## 2011-08-26 ENCOUNTER — Ambulatory Visit
Admission: RE | Admit: 2011-08-26 | Discharge: 2011-08-26 | Disposition: A | Discharge status: Home / Self Care | Payer: Self-pay | Source: Ambulatory Visit | Attending: Radiation Oncology | Admitting: Radiation Oncology

## 2011-08-26 ENCOUNTER — Encounter: Payer: Self-pay | Admitting: Radiation Oncology

## 2011-08-27 ENCOUNTER — Telehealth: Payer: Self-pay | Admitting: *Deleted

## 2011-08-27 ENCOUNTER — Other Ambulatory Visit: Payer: Self-pay | Admitting: *Deleted

## 2011-08-27 ENCOUNTER — Ambulatory Visit: Payer: Self-pay

## 2011-08-27 DIAGNOSIS — R110 Nausea: Secondary | ICD-10-CM

## 2011-08-27 MED ORDER — METOCLOPRAMIDE HCL 10 MG PO TABS: 5.0000 mg | ORAL_TABLET | Freq: Four times a day (QID) | ORAL | Status: DC | PRN

## 2011-08-27 NOTE — Telephone Encounter (Signed)
Per review of note from Dr Basilio Cairo and ongoing nausea - MD requested follow up call to assess.  If continuing - recommendation given for reglan 5mg  tid/ac prn.  Message left on identified VM for a return call to this RN.

## 2011-08-28 ENCOUNTER — Ambulatory Visit: Payer: Self-pay

## 2011-09-02 ENCOUNTER — Other Ambulatory Visit (HOSPITAL_BASED_OUTPATIENT_CLINIC_OR_DEPARTMENT_OTHER): Payer: Self-pay | Admitting: Lab

## 2011-09-02 ENCOUNTER — Telehealth: Payer: Self-pay | Admitting: *Deleted

## 2011-09-02 ENCOUNTER — Ambulatory Visit (HOSPITAL_BASED_OUTPATIENT_CLINIC_OR_DEPARTMENT_OTHER): Payer: Self-pay | Admitting: Oncology

## 2011-09-02 VITALS — BP 148/82 | HR 88 | Temp 98.4°F | Ht 59.0 in | Wt 161.9 lb

## 2011-09-02 DIAGNOSIS — C50319 Malignant neoplasm of lower-inner quadrant of unspecified female breast: Secondary | ICD-10-CM

## 2011-09-02 DIAGNOSIS — Z170 Estrogen receptor positive status [ER+]: Secondary | ICD-10-CM

## 2011-09-02 DIAGNOSIS — C50919 Malignant neoplasm of unspecified site of unspecified female breast: Secondary | ICD-10-CM

## 2011-09-02 DIAGNOSIS — C773 Secondary and unspecified malignant neoplasm of axilla and upper limb lymph nodes: Secondary | ICD-10-CM

## 2011-09-02 LAB — CBC WITH DIFFERENTIAL/PLATELET
BASO%: 0.3 % (ref 0.0–2.0)
Basophils Absolute: 0 10*3/uL (ref 0.0–0.1)
EOS%: 2.8 % (ref 0.0–7.0)
Eosinophils Absolute: 0.1 10*3/uL (ref 0.0–0.5)
HCT: 32.5 % — ABNORMAL LOW (ref 34.8–46.6)
HGB: 10.8 g/dL — ABNORMAL LOW (ref 11.6–15.9)
LYMPH%: 28.2 % (ref 14.0–49.7)
MCH: 30.5 pg (ref 25.1–34.0)
MCHC: 33.2 g/dL (ref 31.5–36.0)
MCV: 91.8 fL (ref 79.5–101.0)
MONO#: 0.3 10*3/uL (ref 0.1–0.9)
MONO%: 10.1 % (ref 0.0–14.0)
NEUT#: 1.9 10*3/uL (ref 1.5–6.5)
NEUT%: 58.6 % (ref 38.4–76.8)
Platelets: 175 10*3/uL (ref 145–400)
RBC: 3.54 10*6/uL — ABNORMAL LOW (ref 3.70–5.45)
RDW: 17.4 % — ABNORMAL HIGH (ref 11.2–14.5)
WBC: 3.3 10*3/uL — ABNORMAL LOW (ref 3.9–10.3)
lymph#: 0.9 10*3/uL (ref 0.9–3.3)

## 2011-09-02 MED ORDER — ANASTROZOLE 1 MG PO TABS: 1.0000 mg | ORAL_TABLET | Freq: Every day | ORAL | Status: AC

## 2011-09-02 NOTE — Progress Notes (Signed)
ID: [name]   DOB: July 28, 1945  MR#: [id number]  JXB#:[id number]  HISTORY OF PRESENT ILLNESS: Ms. Reitan had screening mammography Sep 11, 2010, at [facility]. This showed a possible right axillary mass and she was brought back for additional views Sep 20, 2010. An ultrasound showed abnormal enlarged lymph nodes in the right axilla measuring a maximum of 2.2 cm. Biopsy was suggested and performed on Sep 25, 2010. The pathology from that procedure ([id number]) showed metastatic adenocarcinoma consistent with a breast primary. In particular, the tumor was estrogen and progesterone receptor positive, HER2/neu was negative, the MIB-1 was 40%.  With this information, the patient was referred for bilateral breast MRIs performed May 29. This showed a complex picture, there being in addition to 2 pathologically enlarged lymph nodes in the right axilla and then other mildly prominent lymph nodes, 2 masses in the breast itself, one was subareolar and measured 1.3 cm, the second mass was more posterior and inferior and measured 8 mm. The distance between these 2 masses was 4.5 cm. the second breast mass was biopsied and was found to be benign.  She has a deleterious BRCA-2 mutation, namely 6696 deletion TC.  Status post right mastectomy and axillary lymph node dissection December 04, 2010 for a 1.5 cm invasive ductal carcinoma, grade 3, involving 3/12 axillary lymph nodes, with ample margins, T1c N1 M0, estrogen receptor 94% positive, progesterone receptor 4% positive with an MIB-1 of 62% and no HER2 amplification on the original material. Further therapy as detailed below.  INTERVAL HISTORY:   Moderate returns today for followup of her breast cancer. Interval history is unremarkable. She completed her radiation without unusual side effects. She is now ready to have her port removed and to start anti-estrogens.  REVIEW OF SYSTEMS: She still somewhat fatigued and has not quite gotten herself back to  full-time work yet. She is having some sinus problems. She continues to have some numbness in her toes, right greater than left, and also particularly in the left thumb. Otherwise a detailed review of systems today was noncontributory.  PAST MEDICAL HISTORY: Past Medical History  Diagnosis Date  . GERD (gastroesophageal reflux disease)   . Allergy   . Hypertension   . Hyperlipidemia   . Fatty liver disease, nonalcoholic   . History of hyperkalemia   . Obesity   . Renal lesion   . Sleep disturbances   . Hemorrhoids   . Hx antineoplastic chemo  10/12 - 06/24/11    PACLITAX EL COMPLETED 06/24/11  . Breast CA     (Rt) breast ca dx 4/12---  S/P RADICAL MASTECTOMY AND CHEMORADIATION  . Anemia   . Diabetes mellitus ORAL MED  . OA (osteoarthritis) of knee     RIGHT LEG    PAST SURGICAL HISTORY: Past Surgical History  Procedure Date  . Knee arthroscopy 10-28-1999    RIGHT  . Portacath placement 03/07/11    REPLACED PAC DUE TO MALFUNCTION (LEFT)  . Mastectomy modified radical 12-04-2010    W/ LEFT PAC PLACEMENT (RIGHT BREAST W/ AXILLARY CONTENTS/ NODE BX'S  . Transthoracic echocardiogram 10-29-2010    NORMAL LVF/ EF 55-60%/ MILD MV REGURG  . Vaginal hysterectomy AGE 23    W/ LSO  . Tubal ligation YRS AGO    FAMILY HISTORY Family History  Problem Relation Age of Onset  . Stroke Father   . Heart disease Sister   . Cancer Sister     breast  . Heart disease Brother   .  Cancer Sister     COLON CANCER  patient's mother alive age 38; father deceased, cause unknown. The patient has 8 sisters and 9 brothers.   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   ADVANCED  DIRECTIVES:  HEALTH MAINTENANCE: History  Substance Use Topics  . Smoking status: Never Smoker   . Smokeless tobacco: Never Used  . Alcohol Use: No     Colonoscopy:  PAP:  Bone density:  Lipid panel:  Allergies  Allergen Reactions  . Metformin Other (See Comments)    REACTION, diarrhea: 850 mg - diarrhea but tolerates 500 mg daily    Current Outpatient Prescriptions  Medication Sig Dispense Refill  . aspirin 81 MG tablet Take 81 mg by mouth daily.       Marland Kitchen emollient (BIAFINE) cream Apply topically 2 (two) times daily.      . fluticasone (FLONASE) 50 MCG/ACT nasal spray Place 2 sprays into the nose daily.  16 g  2  . glipiZIDE (GLUCOTROL) 10 MG tablet Take 1 tablet (10 mg total) by mouth 2 (two) times daily before a meal.  90 tablet  3  . hydrochlorothiazide (HYDRODIURIL) 25 MG tablet TAKE ONE TABLET BY MOUTH EVERY DAY  90 tablet  3  . lidocaine-prilocaine (EMLA) cream Apply 1 application topically as needed. Prior to port access      . lisinopril (PRINIVIL,ZESTRIL) 40 MG tablet Take 1 tablet (40 mg total) by mouth daily.  30 tablet  11  . metFORMIN (GLUCOPHAGE) 500 MG tablet Take 1 tablet (500 mg total) by mouth 2 (two) times daily with a meal.  90 tablet  3  . metoCLOPramide (REGLAN) 10 MG tablet Take 0.5 tablets (5 mg total) by mouth 4 (four) times daily as needed.  120 tablet  3  . non-metallic deodorant (ALRA) MISC Apply 1 application topically daily as needed.      Marland Kitchen oxyCODONE-acetaminophen (PERCOCET) 5-325 MG per tablet Take 1 tablet by mouth every 6 (six) hours as needed. For pain      . pravastatin (PRAVACHOL) 40 MG tablet Take 1 tablet (40 mg total) by mouth at bedtime.  90 tablet  5  . prochlorperazine (COMPAZINE) 10 MG tablet Take 10 mg by mouth 4 (four) times daily -  before meals and at bedtime. For nausea      . promethazine (PHENERGAN) 25 MG tablet Take 1 tablet (25 mg total) by mouth every 6 (six) hours as needed for nausea.  30 tablet  1  .  tobramycin-dexamethasone (TOBRADEX) ophthalmic solution Place 1 drop into both eyes as directed. 1 drop in each eye 1-2 times daily while receiving chemo      . Wound Cleansers (RADIAPLEX EX) Apply topically.        OBJECTIVE: Middle-aged [place] American woman in no acute distress  Filed Vitals:   09/02/11 1431  BP: 148/82  Pulse: 88  Temp: 98.4 F (36.9 C)     Body mass index is 32.70 kg/(m^2).    ECOG FS:1 Sclerae unicteric Oropharynx clear No peripheral adenopathy Lungs no rales or rhonchi Heart regular rate and rhythm Abd benign MSK no focal spinal tenderness, no peripheral edema Neuro: nonfocal Breasts: The right breast is status  post mastectomy and radiation. There is no evidence of local recurrence. The left breast is unremarkable  LAB RESULTS: Lab Results  Component Value Date   WBC 3.3* 09/02/2011   NEUTROABS 1.9 09/02/2011   HGB 10.8* 09/02/2011   HCT 32.5* 09/02/2011   MCV 91.8 09/02/2011   PLT 175 09/02/2011      Chemistry      Component Value Date/Time   NA 142 07/14/2011 0700   K 3.3* 07/14/2011 0700   CL 100 06/24/2011 0900   CO2 30 06/24/2011 0900   BUN 12 06/24/2011 0900   CREATININE 0.61 06/24/2011 0900   GLU 132* 03/25/2011 0957      Component Value Date/Time   CALCIUM 9.7 06/24/2011 0900   ALKPHOS 51 06/24/2011 0900   AST 15 06/24/2011 0900   ALT 18 06/24/2011 0900   BILITOT 0.2* 06/24/2011 0900       Lab Results  Component Value Date   LABCA2 14 04/22/2011    No results found for this basename: INR:1;PROTIME:1 in the last 168 hours  No results found for this basename: UACOL:1,UAPR:1,USPG:1,UPH:1,UTP:1,UGL:1,UKET:1,UBIL:1,UHGB:1,UNIT:1,UROB:1,ULEU:1,UEPI:1,UWBC:1,URBC:1,UBAC:1,CAST:1,CRYS:1,UCOM:1,BILUA:1 in the last 72 hours   STUDIES: No new results found.  ASSESSMENT: 66 year old BRCA-2 positive [employer] woman,  (1) status post right mastectomy and axillary lymph node dissection August of 2012 for a T1c N1 (Stage II) invasive ductal  carcinoma, grade 3, estrogen and progesterone receptor positive, HER-2 negative, with an MIB-1-1 of 62%,   (2) Status post 4 cycles of doxorubicin and cyclophosphamide given in dose dense fashion, followed  by weekly paclitaxel x 12 completed 06/24/2011  (3) status post-mastectomy radiation completed April 2013  PLAN: She is now ready to start anti-estrogen therapy. We're going to try anastrozole. She has a good understanding of the possible toxicities side effects and complications and we have also discussed cost issues. She will see [place] again in 3 months. If she is tolerating it well then we will start seeing her on in every six-month basis.  I am requesting an appointment with her surgeon, Dr. Luisa Hart, for port removal, next available.  She knows to call for any problems that may develop before the next visit   Champ Keetch C    09/02/2011

## 2011-09-02 NOTE — Telephone Encounter (Signed)
gave patient appointment for 12-02-2011 starting at 10:15am printed out calendar and gave to the patient

## 2011-09-03 ENCOUNTER — Telehealth: Payer: Self-pay | Admitting: *Deleted

## 2011-09-03 NOTE — Telephone Encounter (Signed)
gave patient appointment for dr.cornett on 09-08-2011 at 3:20pm printed out calendar and gave to the patient

## 2011-09-08 ENCOUNTER — Encounter (INDEPENDENT_AMBULATORY_CARE_PROVIDER_SITE_OTHER): Payer: Self-pay | Admitting: Surgery

## 2011-09-08 ENCOUNTER — Ambulatory Visit (INDEPENDENT_AMBULATORY_CARE_PROVIDER_SITE_OTHER): Payer: Self-pay | Admitting: Surgery

## 2011-09-08 ENCOUNTER — Other Ambulatory Visit (INDEPENDENT_AMBULATORY_CARE_PROVIDER_SITE_OTHER): Payer: Self-pay | Admitting: Surgery

## 2011-09-08 VITALS — BP 162/90 | HR 72 | Temp 97.3°F | Resp 14 | Ht 59.0 in | Wt 156.2 lb

## 2011-09-08 DIAGNOSIS — Z853 Personal history of malignant neoplasm of breast: Secondary | ICD-10-CM

## 2011-09-08 NOTE — Progress Notes (Signed)
Patient ID: [name], female   DOB: 05-13-1945, 66 y.o.   MRN: [id number]  Chief Complaint  Patient presents with  . Follow-up    Eval port removal    HPI [name] is a 66 y.o. female.   HPI The patient returns to history of breast cancer. She underwent in August of 2012 right modified radical mastectomy for a T1 N1 MX stage II multifocal right breast cancer ER positive PR positive. She had 3 of 12 lymph nodes are positive and underwent postoperative radiation therapy. She is done with chemotherapy and needs her port removed. Past Medical History  Diagnosis Date  . GERD (gastroesophageal reflux disease)   . Allergy   . Hypertension   . Hyperlipidemia   . Fatty liver disease, nonalcoholic   . History of hyperkalemia   . Obesity   . Renal lesion   . Sleep disturbances   . Hemorrhoids   . Hx antineoplastic chemo  10/12 - 06/24/11    PACLITAX EL COMPLETED 06/24/11  . Breast CA     (Rt) breast ca dx 4/12---  S/P RADICAL MASTECTOMY AND CHEMORADIATION  . Anemia   . Diabetes mellitus ORAL MED  . OA (osteoarthritis) of knee     RIGHT LEG    Past Surgical History  Procedure Date  . Knee arthroscopy 10-28-1999    RIGHT  . Portacath placement 03/07/11    REPLACED PAC DUE TO MALFUNCTION (LEFT)  . Mastectomy modified radical 12-04-2010    W/ LEFT PAC PLACEMENT (RIGHT BREAST W/ AXILLARY CONTENTS/ NODE BX'S  . Transthoracic echocardiogram 10-29-2010    NORMAL LVF/ EF 55-60%/ MILD MV REGURG  . Vaginal hysterectomy AGE 60    W/ LSO  . Tubal ligation YRS AGO    Family History  Problem Relation Age of Onset  . Stroke Father   . Heart disease Sister   . Cancer Sister     breast  . Heart disease Brother   . Cancer Sister     COLON CANCER    Social History History  Substance Use Topics  . Smoking status: Never Smoker   . Smokeless tobacco: Never Used  . Alcohol Use: No    Allergies  Allergen Reactions  . Metformin Other (See Comments)    REACTION,  diarrhea: 850 mg - diarrhea but tolerates 500 mg daily    Current Outpatient Prescriptions  Medication Sig Dispense Refill  . anastrozole (ARIMIDEX) 1 MG tablet Take 1 tablet (1 mg total) by mouth daily.  30 tablet  12  . aspirin 81 MG tablet Take 81 mg by mouth daily.       . fluticasone (FLONASE) 50 MCG/ACT nasal spray Place 2 sprays into the nose daily.  16 g  2  . glipiZIDE (GLUCOTROL) 10 MG tablet Take 1 tablet (10 mg total) by mouth 2 (two) times daily before a meal.  90 tablet  3  . hydrochlorothiazide (HYDRODIURIL) 25 MG tablet TAKE ONE TABLET BY MOUTH EVERY DAY  90 tablet  3  . lisinopril (PRINIVIL,ZESTRIL) 40 MG tablet Take 1 tablet (40 mg total) by mouth daily.  30 tablet  11  . metFORMIN (GLUCOPHAGE) 500 MG tablet Take 1 tablet (500 mg total) by mouth 2 (two) times daily with a meal.  90 tablet  3  . Polyvinyl Alcohol-Povidone (REFRESH OP) Apply to eye as needed.      . pravastatin (PRAVACHOL) 40 MG tablet Take 1 tablet (40 mg total) by mouth at bedtime.  90 tablet  5  . metoCLOPramide (REGLAN) 10 MG tablet Take 0.5 tablets (5 mg total) by mouth 4 (four) times daily as needed.  120 tablet  3  . prochlorperazine (COMPAZINE) 10 MG tablet Take 10 mg by mouth as needed. For nausea      . promethazine (PHENERGAN) 25 MG tablet Take 1 tablet (25 mg total) by mouth every 6 (six) hours as needed for nausea.  30 tablet  1    Review of Systems Review of Systems  Constitutional: Negative for fever, chills and unexpected weight change.  HENT: Negative for hearing loss, congestion, sore throat, trouble swallowing and voice change.   Eyes: Negative for visual disturbance.  Respiratory: Negative for cough and wheezing.   Cardiovascular: Negative for chest pain, palpitations and leg swelling.  Gastrointestinal: Negative for nausea, vomiting, abdominal pain, diarrhea, constipation, blood in stool, abdominal distention and anal bleeding.  Genitourinary: Negative for hematuria, vaginal bleeding  and difficulty urinating.  Musculoskeletal: Negative for arthralgias.  Skin: Negative for rash and wound.  Neurological: Negative for seizures, syncope and headaches.  Hematological: Negative for adenopathy. Does not bruise/bleed easily.  Psychiatric/Behavioral: Negative for confusion.    Blood pressure 162/90, pulse 72, temperature 97.3 F (36.3 C), temperature source Temporal, resp. rate 14, height 4\' 11"  (1.499 m), weight 156 lb 4 oz (70.875 kg).  Physical Exam Physical Exam  Constitutional: She appears well-developed and well-nourished.  HENT:  Head: Normocephalic and atraumatic.  Eyes: EOM are normal. Pupils are equal, round, and reactive to light.  Neck: Normal range of motion. Neck supple.  Pulmonary/Chest: Effort normal and breath sounds normal.  Abdominal: Soft. Bowel sounds are normal.  Skin:     Psychiatric: She has a normal mood and affect. Her behavior is normal. Judgment and thought content normal.    Data Reviewed Dr Princella Pellegrini note  Assessment    History of T1N1MX RIGHT BREAST CANCER BRCA2    Plan    Port removal.  Will need to follow for BRCA2 state.  The procedure has been discussed with the patient.  Alternative therapies have been discussed with the patient.  Operative risks include bleeding,  Infection,  Organ injury,  Nerve injury,  Blood vessel injury,  DVT,  Pulmonary embolism,  Death,  And possible reoperation.  Medical management risks include worsening of present situation.  .  The patient understands and agrees to proceed.       Nyna Chilton A. 09/08/2011, 3:48 PM

## 2011-09-08 NOTE — Patient Instructions (Signed)
You will be scheduled for port removal. 

## 2011-09-16 ENCOUNTER — Encounter (HOSPITAL_COMMUNITY): Payer: Self-pay | Admitting: Pharmacy Technician

## 2011-09-16 ENCOUNTER — Ambulatory Visit (INDEPENDENT_AMBULATORY_CARE_PROVIDER_SITE_OTHER): Payer: Self-pay | Admitting: Internal Medicine

## 2011-09-16 ENCOUNTER — Encounter: Payer: Self-pay | Admitting: Internal Medicine

## 2011-09-16 VITALS — BP 149/82 | HR 88 | Temp 98.4°F | Ht 59.0 in | Wt 156.0 lb

## 2011-09-16 DIAGNOSIS — Z79899 Other long term (current) drug therapy: Secondary | ICD-10-CM

## 2011-09-16 DIAGNOSIS — E119 Type 2 diabetes mellitus without complications: Secondary | ICD-10-CM

## 2011-09-16 DIAGNOSIS — M1990 Unspecified osteoarthritis, unspecified site: Secondary | ICD-10-CM

## 2011-09-16 DIAGNOSIS — E785 Hyperlipidemia, unspecified: Secondary | ICD-10-CM

## 2011-09-16 LAB — LIPID PANEL
Cholesterol: 163 mg/dL (ref 0–200)
HDL: 45 mg/dL (ref >39)
LDL Cholesterol: 102 mg/dL — ABNORMAL HIGH (ref 0–99)
Total CHOL/HDL Ratio: 3.6 Ratio
Triglycerides: 78 mg/dL (ref <150)
VLDL: 16 mg/dL (ref 0–40)

## 2011-09-16 LAB — POCT GLYCOSYLATED HEMOGLOBIN (HGB A1C): Hemoglobin A1C: 6

## 2011-09-16 LAB — GLUCOSE, CAPILLARY: Glucose-Capillary: 65 mg/dL — ABNORMAL LOW (ref 70–99)

## 2011-09-16 MED ORDER — DICLOFENAC SODIUM 1 % TD GEL: 1.0000 "application " | Freq: Four times a day (QID) | TRANSDERMAL | Status: DC | PRN

## 2011-09-16 NOTE — Assessment & Plan Note (Signed)
Lab Results  Component Value Date   HGBA1C 6.0 09/16/2011   HGBA1C 8.1* 03/11/2011   CREATININE 0.61 06/24/2011   MICROALBUR 1.03 01/20/2011   MICRALBCREAT 4.9 01/20/2011   CHOL 144 08/14/2009   HDL 36* 08/14/2009   TRIG 124 08/14/2009    Last eye exam and foot exam:    Component Value Date/Time   HMDIABEYEEXA No Diabetic Retinopathy 10/27/2008   HMDIABFOOTEX yes 03/19/2010   HMDIABFOOTEX Normal 03/19/2010   HMDIABFOOTEX Yes- Normal 03/19/2010    Assessment: Diabetes control: controlled Progress toward goals: improved Barriers to meeting goals: no barriers identified  Plan: Diabetes treatment: continue current medications Refer to: none Instruction/counseling given: reminded to get eye exam and reminded to bring medications to each visit

## 2011-09-16 NOTE — Assessment & Plan Note (Signed)
Treat symptoms with alternating tylenol & advil.  Also voltaren gel as needed.

## 2011-09-16 NOTE — Assessment & Plan Note (Signed)
Last LDL = 83 in 08/2009.  Recheck nonfasting lipid panel today.    Continue Pravastatin 40 Continue ASA 81

## 2011-09-16 NOTE — Assessment & Plan Note (Signed)
Lab Results  Component Value Date   NA 142 07/14/2011   K 3.3* 07/14/2011   CL 100 06/24/2011   CO2 30 06/24/2011   BUN 12 06/24/2011   CREATININE 0.61 06/24/2011    BP Readings from Last 3 Encounters:  09/16/11 149/82  09/08/11 162/90  09/02/11 148/82  Recheck BP 130/90  Assessment: Hypertension control:  Mildly elevated Progress toward goals:  unchanged, systolic at goal, diastolic elevated Barriers to meeting goals:  no barriers identified  Plan: Hypertension treatment:  continue current medications (Lisinopril 40, HCTZ 25) Will decide if to change regimen once patient is more regular with meds (occassionally skipped meds d/t nausea from chemo)

## 2011-09-16 NOTE — Progress Notes (Signed)
  Subjective:   Patient ID: [name] female   DOB: [birth year] 66 y.o.   MRN: [id number]  HPI: [name] is a 66 y.o. woman with PMH of DM, HTN, HLD, and ER+/PR+ Breast Ca (invasive ductal carcinoma) with axillary node involvement s/p right mastectomy and b/l oopherectomy.  She has completed chemo & radiation and is now on anastrozole for 5 years.  She has a deleterious BRACA-2 mutation.  No HER2 amplification.   She is doing relatively well recovering from her Breast Ca.  Her mood is much improved from when she was diagnosed and awaiting surgery last year.  Regarding her DM, her sugars are well controlled. She is compliant with her meds. Denies sx of hyperglycemia such as polyuria, polydipsia, polyphasia.  Denies sx of hypoglycemia such has HA/dizziness/diaphoresis/SOB.  Her appetite is still limited d/t feeling nauseated with chemo in her system.   She tells me she has had an eye exam within the last year, and will try to bring records with her next time.  She cannot remember the name of her eye doctor.   She is compliant with her antihypertensive regimen as well as her statin.    Review of Systems: Constitutional: Denies fever, chills, diaphoresis HEENT: Denies photophobia, eye pain, redness, hearing loss, ear pain, congestion, sore throat, rhinorrhea, sneezing, mouth sores, trouble swallowing, neck pain, neck stiffness and tinnitus.   Respiratory: Denies SOB, DOE, cough, chest tightness,  and wheezing.   Cardiovascular: Denies chest pain, palpitations and leg swelling.  Gastrointestinal: Denies vomiting, abdominal pain, diarrhea, constipation, blood in stool and abdominal distention.  Genitourinary: Denies dysuria, urgency, frequency, hematuria, flank pain and difficulty urinating.  Musculoskeletal: Denies myalgias, back pain, joint swelling and gait problem. Does c/o b/l knee and ankle pain for which she has not needed anything until now because she used to be on stronger  pain medication after breast Ca surgery Skin: Denies pallor, rash and wound.  Neurological: Denies dizziness, seizures, syncope, weakness, light-headedness, numbness and headaches.  Psychiatric/Behavioral: Denies suicidal ideation, mood changes, confusion, nervousness, sleep disturbance and agitation  Objective:  Physical Exam: Filed Vitals:   09/16/11 1129  BP: 149/82  Pulse: 88  Temp: 98.4 F (36.9 C)  TempSrc: Oral  Height: 4\' 11"  (1.499 m)  Weight: 156 lb (70.761 kg)  SpO2: 100%   Constitutional: Vital signs reviewed.  Patient is a well-developed and well-nourished woman in no acute distress and cooperative with exam.  Mouth: Small 2mm freckle on MM of right cheek, poor dentition Eyes: PERRL, EOMI, conjunctivae normal, No scleral icterus.  Cardiovascular: RRR, S1 normal, S2 normal, no MRG, pulses symmetric and intact bilaterally Pulmonary/Chest: CTAB, no wheezes, rales, or rhonchi Abdominal: Soft. Non-tender, non-distended, bowel sounds are normal Musculoskeletal: No joint deformities, erythema, or stiffness, ROM full and no nontender Neurological: A&O x3  Psychiatric: Normal mood and affect. speech and behavior is normal. Judgment and thought content normal. Cognition and memory are normal.   Assessment & Plan:   Case and care discussed with Dr. Josem Kaufmann. Patient to return in 3 months for DM follow up.  Please see problem oriented charting for further detail.

## 2011-09-16 NOTE — Patient Instructions (Signed)
-  You are doing great with your diabetes!  Keep it up!  -Please go to the lab to have your blood drawn so we can check your cholesterol  -You may alternate between advil & tylenol for your knee pain.  I will also send in a gel (Voltaren) that you can apply to the joints that ache as well.  -Continue taking metformin & glipizide twice daily for your diabetes  -Please bring in records from your most recent eye exam at your next visit.  Please be sure to bring all of your medications with you to every visit.  Should you have any new or worsening symptoms, please be sure to call the clinic at [phone].

## 2011-09-18 ENCOUNTER — Encounter (HOSPITAL_COMMUNITY)
Admission: RE | Admit: 2011-09-18 | Discharge: 2011-09-18 | Disposition: A | Discharge status: Home / Self Care | Payer: Self-pay | Source: Ambulatory Visit | Attending: Internal Medicine | Admitting: Internal Medicine

## 2011-09-18 ENCOUNTER — Encounter (HOSPITAL_COMMUNITY): Payer: Self-pay

## 2011-09-18 LAB — CBC
HCT: 33.5 % — ABNORMAL LOW (ref 36.0–46.0)
Hemoglobin: 11 g/dL — ABNORMAL LOW (ref 12.0–15.0)
MCH: 30.1 pg (ref 26.0–34.0)
MCHC: 32.8 g/dL (ref 30.0–36.0)
MCV: 91.5 fL (ref 78.0–100.0)
Platelets: 243 10*3/uL (ref 150–400)
RBC: 3.66 MIL/uL — ABNORMAL LOW (ref 3.87–5.11)
RDW: 17.1 % — ABNORMAL HIGH (ref 11.5–15.5)
WBC: 4.3 10*3/uL (ref 4.0–10.5)

## 2011-09-18 LAB — COMPREHENSIVE METABOLIC PANEL
ALT: 18 U/L (ref 0–35)
AST: 17 U/L (ref 0–37)
Albumin: 4.2 g/dL (ref 3.5–5.2)
Alkaline Phosphatase: 72 U/L (ref 39–117)
BUN: 17 mg/dL (ref 6–23)
CO2: 26 mEq/L (ref 19–32)
Calcium: 9.4 mg/dL (ref 8.4–10.5)
Chloride: 104 mEq/L (ref 96–112)
Creatinine, Ser: 0.62 mg/dL (ref 0.50–1.10)
GFR calc Af Amer: >90 mL/min (ref >90)
GFR calc non Af Amer: >90 mL/min (ref >90)
Glucose, Bld: 168 mg/dL — ABNORMAL HIGH (ref 70–99)
Potassium: 3.5 mEq/L (ref 3.5–5.1)
Sodium: 142 mEq/L (ref 135–145)
Total Bilirubin: 0.2 mg/dL — ABNORMAL LOW (ref 0.3–1.2)
Total Protein: 7 g/dL (ref 6.0–8.3)

## 2011-09-18 LAB — SURGICAL PCR SCREEN
MRSA, PCR: NEGATIVE
Staphylococcus aureus: NEGATIVE

## 2011-09-18 MED ORDER — CHLORHEXIDINE GLUCONATE 4 % EX LIQD
1.0000 "application " | Freq: Once | CUTANEOUS | Status: DC
  Filled 2011-09-18: qty 15

## 2011-09-18 NOTE — Patient Instructions (Signed)
20 KATYA ROLSTON  09/18/2011   Your procedure is scheduled on:  09/24/11  Report to SHORT STAY DEPT  at 6:15 AM.  Call this number if you have problems the morning of surgery: [phone]   Remember:   Do not eat food or drink liquids AFTER MIDNIGHT    Take these medicines the morning of surgery with A SIP OF WATER: FLONASE IF NEEDED   Do not wear jewelry, make-up or nail polish.  Do not wear lotions, powders, or perfumes.   Do not shave legs or underarms 48 hrs. before surgery (men may shave face)  Do not bring valuables to the hospital.  Contacts, dentures or bridgework may not be worn into surgery.  Leave suitcase in the car. After surgery it may be brought to your room.  For patients admitted to the hospital, checkout time is 11:00 AM the day of discharge.   Patients discharged the day of surgery will not be allowed to drive home.    Special Instructions:   Please read over the following fact sheets that you were given: MRSA  Information               SHOWER WITH BETASEPT THE NIGHT BEFORE SURGERY AND THE MORNING OF SURGERY

## 2011-09-23 ENCOUNTER — Other Ambulatory Visit: Payer: Self-pay | Admitting: Obstetrics and Gynecology

## 2011-09-23 ENCOUNTER — Ambulatory Visit (INDEPENDENT_AMBULATORY_CARE_PROVIDER_SITE_OTHER): Payer: Self-pay | Admitting: *Deleted

## 2011-09-23 VITALS — BP 138/87 | HR 90 | Temp 99.5°F | Ht 59.0 in | Wt 156.5 lb

## 2011-09-23 DIAGNOSIS — Z1231 Encounter for screening mammogram for malignant neoplasm of breast: Secondary | ICD-10-CM

## 2011-09-23 DIAGNOSIS — Z9011 Acquired absence of right breast and nipple: Secondary | ICD-10-CM

## 2011-09-23 DIAGNOSIS — Z1239 Encounter for other screening for malignant neoplasm of breast: Secondary | ICD-10-CM

## 2011-09-23 NOTE — Patient Instructions (Signed)
Taught patient how to perform BSE. Patient did not need a Pap smear today due to history of hysterectomy for benign reasons. Let patient know that she no longer needs Pap smears. Patient referred to the [facility] for Diagnostic Mammogram due to history of breast cancer 1 year ago. Appointment scheduled Tuesday, October 07, 2011 at 1230. Patient aware of appointment and will be there. Let patient know will follow up with her within the next couple weeks with results. Patient verbalized understanding.

## 2011-09-23 NOTE — Progress Notes (Signed)
No complaints today.  Pap Smear:    Pap smear not performed today. Patient has a history of a hysterectomy 26 years ago for fibroids. Per patient she has no history of abnormal Pap smears. Pap smear not indicated per BCCCP guidelines. No Pap smear results in EPIC.  Physical exam: Breasts Patient has a history of mastectomy of right breast May 2012. No skin abnormalities left breast. No nipple retraction left breast. No nipple discharge bilateral breasts. No lymphadenopathy. No lumps palpated bilateral breasts. No complaints of pain or tenderness on exam.  Patient referred to the [facility] for Diagnostic Mammogram due to history of breast cancer 1 year ago. Appointment scheduled Tuesday, October 07, 2011 at 1230.       Pelvic/Bimanual No Pap smear completed today since history of hysterectomy for benign reasons. Pap smear not indicated per BCCCP guidelines.

## 2011-09-24 ENCOUNTER — Encounter (HOSPITAL_COMMUNITY): Payer: Self-pay | Admitting: Anesthesiology

## 2011-09-24 ENCOUNTER — Ambulatory Visit (HOSPITAL_COMMUNITY): Payer: Self-pay | Admitting: Anesthesiology

## 2011-09-24 ENCOUNTER — Ambulatory Visit (HOSPITAL_COMMUNITY)
Admission: RE | Admit: 2011-09-24 | Discharge: 2011-09-24 | Disposition: A | Discharge status: Home / Self Care | Payer: Self-pay | Source: Ambulatory Visit | Attending: Surgery | Admitting: Surgery

## 2011-09-24 ENCOUNTER — Encounter (HOSPITAL_COMMUNITY): Payer: Self-pay

## 2011-09-24 ENCOUNTER — Encounter (HOSPITAL_COMMUNITY)
Admission: RE | Disposition: A | Discharge status: Home / Self Care | Payer: Self-pay | Source: Ambulatory Visit | Attending: Surgery

## 2011-09-24 DIAGNOSIS — Z01810 Encounter for preprocedural cardiovascular examination: Secondary | ICD-10-CM | POA: Insufficient documentation

## 2011-09-24 DIAGNOSIS — C50119 Malignant neoplasm of central portion of unspecified female breast: Secondary | ICD-10-CM

## 2011-09-24 DIAGNOSIS — Z452 Encounter for adjustment and management of vascular access device: Secondary | ICD-10-CM | POA: Insufficient documentation

## 2011-09-24 DIAGNOSIS — K219 Gastro-esophageal reflux disease without esophagitis: Secondary | ICD-10-CM | POA: Insufficient documentation

## 2011-09-24 DIAGNOSIS — E669 Obesity, unspecified: Secondary | ICD-10-CM

## 2011-09-24 DIAGNOSIS — Z79899 Other long term (current) drug therapy: Secondary | ICD-10-CM | POA: Insufficient documentation

## 2011-09-24 DIAGNOSIS — K7689 Other specified diseases of liver: Secondary | ICD-10-CM

## 2011-09-24 DIAGNOSIS — Z86010 Personal history of colonic polyps: Secondary | ICD-10-CM

## 2011-09-24 DIAGNOSIS — E119 Type 2 diabetes mellitus without complications: Secondary | ICD-10-CM | POA: Insufficient documentation

## 2011-09-24 DIAGNOSIS — M545 Low back pain, unspecified: Secondary | ICD-10-CM

## 2011-09-24 DIAGNOSIS — E785 Hyperlipidemia, unspecified: Secondary | ICD-10-CM | POA: Insufficient documentation

## 2011-09-24 DIAGNOSIS — D649 Anemia, unspecified: Secondary | ICD-10-CM

## 2011-09-24 DIAGNOSIS — Z01812 Encounter for preprocedural laboratory examination: Secondary | ICD-10-CM | POA: Insufficient documentation

## 2011-09-24 DIAGNOSIS — J309 Allergic rhinitis, unspecified: Secondary | ICD-10-CM

## 2011-09-24 DIAGNOSIS — C50919 Malignant neoplasm of unspecified site of unspecified female breast: Secondary | ICD-10-CM | POA: Insufficient documentation

## 2011-09-24 DIAGNOSIS — M1990 Unspecified osteoarthritis, unspecified site: Secondary | ICD-10-CM

## 2011-09-24 DIAGNOSIS — I10 Essential (primary) hypertension: Secondary | ICD-10-CM | POA: Insufficient documentation

## 2011-09-24 HISTORY — PX: PORT-A-CATH REMOVAL: SHX5289

## 2011-09-24 LAB — GLUCOSE, CAPILLARY: Glucose-Capillary: 128 mg/dL — ABNORMAL HIGH (ref 70–99)

## 2011-09-24 SURGERY — REMOVAL PORT-A-CATH
Anesthesia: Monitor Anesthesia Care | Site: Chest | Wound class: Clean

## 2011-09-24 MED ORDER — CEFAZOLIN SODIUM-DEXTROSE 2-3 GM-% IV SOLR
2.0000 g | INTRAVENOUS | Status: AC
  Administered 2011-09-24: 2 g via INTRAVENOUS

## 2011-09-24 MED ORDER — ACETAMINOPHEN 10 MG/ML IV SOLN
INTRAVENOUS | Status: AC
  Filled 2011-09-24: qty 100

## 2011-09-24 MED ORDER — PROPOFOL 10 MG/ML IV EMUL
INTRAVENOUS | Status: DC | PRN
  Administered 2011-09-24: 75 ug/kg/min via INTRAVENOUS

## 2011-09-24 MED ORDER — CEFAZOLIN SODIUM-DEXTROSE 2-3 GM-% IV SOLR
INTRAVENOUS | Status: AC
  Filled 2011-09-24: qty 50

## 2011-09-24 MED ORDER — ACETAMINOPHEN 10 MG/ML IV SOLN
INTRAVENOUS | Status: DC | PRN
  Administered 2011-09-24: 1000 mg via INTRAVENOUS

## 2011-09-24 MED ORDER — FENTANYL CITRATE 0.05 MG/ML IJ SOLN: 25.0000 ug | INTRAMUSCULAR | Status: DC | PRN

## 2011-09-24 MED ORDER — LACTATED RINGERS IV SOLN
INTRAVENOUS | Status: DC
  Administered 2011-09-24: 1000 mL via INTRAVENOUS

## 2011-09-24 MED ORDER — MIDAZOLAM HCL 5 MG/5ML IJ SOLN
INTRAMUSCULAR | Status: DC | PRN
  Administered 2011-09-24: 2 mg via INTRAVENOUS

## 2011-09-24 MED ORDER — FENTANYL CITRATE 0.05 MG/ML IJ SOLN
INTRAMUSCULAR | Status: DC | PRN
  Administered 2011-09-24: 50 ug via INTRAVENOUS

## 2011-09-24 MED ORDER — BUPIVACAINE-EPINEPHRINE PF 0.25-1:200000 % IJ SOLN
INTRAMUSCULAR | Status: DC | PRN
  Administered 2011-09-24: 16 mL

## 2011-09-24 MED ORDER — HYDROCODONE-ACETAMINOPHEN 5-325 MG PO TABS: 1.0000 | ORAL_TABLET | Freq: Four times a day (QID) | ORAL | Status: AC | PRN

## 2011-09-24 MED ORDER — BUPIVACAINE-EPINEPHRINE PF 0.25-1:200000 % IJ SOLN
INTRAMUSCULAR | Status: AC
  Filled 2011-09-24: qty 30

## 2011-09-24 MED ORDER — 0.9 % SODIUM CHLORIDE (POUR BTL) OPTIME
TOPICAL | Status: DC | PRN
  Administered 2011-09-24: 1000 mL

## 2011-09-24 MED ORDER — PROMETHAZINE HCL 25 MG/ML IJ SOLN: 6.2500 mg | INTRAMUSCULAR | Status: DC | PRN

## 2011-09-24 MED ORDER — LACTATED RINGERS IV SOLN
INTRAVENOUS | Status: DC | PRN
  Administered 2011-09-24: 08:00:00 via INTRAVENOUS

## 2011-09-24 SURGICAL SUPPLY — 39 items
ADH SKN CLS APL DERMABOND .7 (GAUZE/BANDAGES/DRESSINGS) ×1
BLADE HEX COATED 2.75 (ELECTRODE) ×2 IMPLANT
BLADE SURG 15 STRL LF DISP TIS (BLADE) ×2 IMPLANT
BLADE SURG 15 STRL SS (BLADE)
BLADE SURG SZ10 CARB STEEL (BLADE) ×2 IMPLANT
CANISTER SUCTION 2500CC (MISCELLANEOUS) ×2 IMPLANT
CHLORAPREP W/TINT 10.5 ML (MISCELLANEOUS) ×1 IMPLANT
CLOTH BEACON ORANGE TIMEOUT ST (SAFETY) ×2 IMPLANT
COVER SURGICAL LIGHT HANDLE (MISCELLANEOUS) ×1 IMPLANT
DECANTER SPIKE VIAL GLASS SM (MISCELLANEOUS) ×1 IMPLANT
DERMABOND ADVANCED (GAUZE/BANDAGES/DRESSINGS) ×1
DERMABOND ADVANCED .7 DNX12 (GAUZE/BANDAGES/DRESSINGS) IMPLANT
DRAPE LAPAROTOMY TRNSV 102X78 (DRAPE) ×2 IMPLANT
ELECT REM PT RETURN 9FT ADLT (ELECTROSURGICAL) ×2
ELECTRODE REM PT RTRN 9FT ADLT (ELECTROSURGICAL) ×1 IMPLANT
GAUZE SPONGE 4X4 16PLY XRAY LF (GAUZE/BANDAGES/DRESSINGS) ×2 IMPLANT
GLOVE BIOGEL PI IND STRL 7.0 (GLOVE) ×1 IMPLANT
GLOVE BIOGEL PI INDICATOR 7.0 (GLOVE)
GLOVE INDICATOR 8.0 STRL GRN (GLOVE) ×3 IMPLANT
GLOVE SS BIOGEL STRL SZ 8 (GLOVE) ×1 IMPLANT
GLOVE SUPERSENSE BIOGEL SZ 8 (GLOVE) ×1
GOWN STRL NON-REIN LRG LVL3 (GOWN DISPOSABLE) ×1 IMPLANT
GOWN STRL REIN XL XLG (GOWN DISPOSABLE) ×2 IMPLANT
KIT BASIN OR (CUSTOM PROCEDURE TRAY) ×2 IMPLANT
NDL HYPO 25X1 1.5 SAFETY (NEEDLE) ×1 IMPLANT
NEEDLE HYPO 22GX1.5 SAFETY (NEEDLE) ×1 IMPLANT
NEEDLE HYPO 25X1 1.5 SAFETY (NEEDLE) ×2 IMPLANT
NS IRRIG 1000ML POUR BTL (IV SOLUTION) ×2 IMPLANT
PACK BASIC VI WITH GOWN DISP (CUSTOM PROCEDURE TRAY) ×2 IMPLANT
PEN SKIN MARKING BROAD (MISCELLANEOUS) ×1 IMPLANT
PENCIL BUTTON HOLSTER BLD 10FT (ELECTRODE) ×2 IMPLANT
SOL PREP POV-IOD 16OZ 10% (MISCELLANEOUS) ×1 IMPLANT
SPONGE GAUZE 4X4 12PLY (GAUZE/BANDAGES/DRESSINGS) ×1 IMPLANT
SUT MNCRL AB 3-0 PS2 18 (SUTURE) ×1 IMPLANT
SUT VIC AB 4-0 SH 27 (SUTURE) ×2
SUT VIC AB 4-0 SH 27XBRD (SUTURE) IMPLANT
SYR CONTROL 10ML LL (SYRINGE) ×2 IMPLANT
TOWEL OR 17X26 10 PK STRL BLUE (TOWEL DISPOSABLE) ×1 IMPLANT
YANKAUER SUCT BULB TIP 10FT TU (MISCELLANEOUS) ×2 IMPLANT

## 2011-09-24 NOTE — Interval H&P Note (Signed)
History and Physical Interval Note:  09/24/2011 7:51 AM  [name]  has presented today for surgery, with the diagnosis of portacath  The various methods of treatment have been discussed with the patient and family. After consideration of risks, benefits and other options for treatment, the patient has consented to  Procedure(s) (LRB): REMOVAL PORT-A-CATH (N/A) as a surgical intervention .  The patients' history has been reviewed, patient examined, no change in status, stable for surgery.  I have reviewed the patients' chart and labs.  Questions were answered to the patient's satisfaction.     Huxley Vanwagoner A.

## 2011-09-24 NOTE — Anesthesia Postprocedure Evaluation (Signed)
  Anesthesia Post-op Note  Patient: [name]  Procedure(s) Performed: Procedure(s) (LRB): REMOVAL PORT-A-CATH (N/A)  Patient Location: PACU  Anesthesia Type: MAC  Level of Consciousness: awake and alert   Airway and Oxygen Therapy: Patient Spontanous Breathing  Post-op Pain: mild  Post-op Assessment: Post-op Vital signs reviewed, Patient's Cardiovascular Status Stable, Respiratory Function Stable, Patent Airway and No signs of Nausea or vomiting  Post-op Vital Signs: stable  Complications: No apparent anesthesia complications

## 2011-09-24 NOTE — H&P (View-Only) (Signed)
Patient ID: [name], female   DOB: 07/30/1945, 66 y.o.   MRN: [id number]  Chief Complaint  Patient presents with  . Follow-up    Eval port removal    HPI [name] is a 66 y.o. female.   HPI The patient returns to history of breast cancer. She underwent in August of 2012 right modified radical mastectomy for a T1 N1 MX stage II multifocal right breast cancer ER positive PR positive. She had 3 of 12 lymph nodes are positive and underwent postoperative radiation therapy. She is done with chemotherapy and needs her port removed. Past Medical History  Diagnosis Date  . GERD (gastroesophageal reflux disease)   . Allergy   . Hypertension   . Hyperlipidemia   . Fatty liver disease, nonalcoholic   . History of hyperkalemia   . Obesity   . Renal lesion   . Sleep disturbances   . Hemorrhoids   . Hx antineoplastic chemo  10/12 - 06/24/11    PACLITAX EL COMPLETED 06/24/11  . Breast CA     (Rt) breast ca dx 4/12---  S/P RADICAL MASTECTOMY AND CHEMORADIATION  . Anemia   . Diabetes mellitus ORAL MED  . OA (osteoarthritis) of knee     RIGHT LEG    Past Surgical History  Procedure Date  . Knee arthroscopy 10-28-1999    RIGHT  . Portacath placement 03/07/11    REPLACED PAC DUE TO MALFUNCTION (LEFT)  . Mastectomy modified radical 12-04-2010    W/ LEFT PAC PLACEMENT (RIGHT BREAST W/ AXILLARY CONTENTS/ NODE BX'S  . Transthoracic echocardiogram 10-29-2010    NORMAL LVF/ EF 55-60%/ MILD MV REGURG  . Vaginal hysterectomy AGE 40    W/ LSO  . Tubal ligation YRS AGO    Family History  Problem Relation Age of Onset  . Stroke Father   . Heart disease Sister   . Cancer Sister     breast  . Heart disease Brother   . Cancer Sister     COLON CANCER    Social History History  Substance Use Topics  . Smoking status: Never Smoker   . Smokeless tobacco: Never Used  . Alcohol Use: No    Allergies  Allergen Reactions  . Metformin Other (See Comments)    REACTION,  diarrhea: 850 mg - diarrhea but tolerates 500 mg daily    Current Outpatient Prescriptions  Medication Sig Dispense Refill  . anastrozole (ARIMIDEX) 1 MG tablet Take 1 tablet (1 mg total) by mouth daily.  30 tablet  12  . aspirin 81 MG tablet Take 81 mg by mouth daily.       . fluticasone (FLONASE) 50 MCG/ACT nasal spray Place 2 sprays into the nose daily.  16 g  2  . glipiZIDE (GLUCOTROL) 10 MG tablet Take 1 tablet (10 mg total) by mouth 2 (two) times daily before a meal.  90 tablet  3  . hydrochlorothiazide (HYDRODIURIL) 25 MG tablet TAKE ONE TABLET BY MOUTH EVERY DAY  90 tablet  3  . lisinopril (PRINIVIL,ZESTRIL) 40 MG tablet Take 1 tablet (40 mg total) by mouth daily.  30 tablet  11  . metFORMIN (GLUCOPHAGE) 500 MG tablet Take 1 tablet (500 mg total) by mouth 2 (two) times daily with a meal.  90 tablet  3  . Polyvinyl Alcohol-Povidone (REFRESH OP) Apply to eye as needed.      . pravastatin (PRAVACHOL) 40 MG tablet Take 1 tablet (40 mg total) by mouth at bedtime.    90 tablet  5  . metoCLOPramide (REGLAN) 10 MG tablet Take 0.5 tablets (5 mg total) by mouth 4 (four) times daily as needed.  120 tablet  3  . prochlorperazine (COMPAZINE) 10 MG tablet Take 10 mg by mouth as needed. For nausea      . promethazine (PHENERGAN) 25 MG tablet Take 1 tablet (25 mg total) by mouth every 6 (six) hours as needed for nausea.  30 tablet  1    Review of Systems Review of Systems  Constitutional: Negative for fever, chills and unexpected weight change.  HENT: Negative for hearing loss, congestion, sore throat, trouble swallowing and voice change.   Eyes: Negative for visual disturbance.  Respiratory: Negative for cough and wheezing.   Cardiovascular: Negative for chest pain, palpitations and leg swelling.  Gastrointestinal: Negative for nausea, vomiting, abdominal pain, diarrhea, constipation, blood in stool, abdominal distention and anal bleeding.  Genitourinary: Negative for hematuria, vaginal bleeding  and difficulty urinating.  Musculoskeletal: Negative for arthralgias.  Skin: Negative for rash and wound.  Neurological: Negative for seizures, syncope and headaches.  Hematological: Negative for adenopathy. Does not bruise/bleed easily.  Psychiatric/Behavioral: Negative for confusion.    Blood pressure 162/90, pulse 72, temperature 97.3 F (36.3 C), temperature source Temporal, resp. rate 14, height 4' 11" (1.499 m), weight 156 lb 4 oz (70.875 kg).  Physical Exam Physical Exam  Constitutional: She appears well-developed and well-nourished.  HENT:  Head: Normocephalic and atraumatic.  Eyes: EOM are normal. Pupils are equal, round, and reactive to light.  Neck: Normal range of motion. Neck supple.  Pulmonary/Chest: Effort normal and breath sounds normal.  Abdominal: Soft. Bowel sounds are normal.  Skin:     Psychiatric: She has a normal mood and affect. Her behavior is normal. Judgment and thought content normal.    Data Reviewed Dr Magrinats note  Assessment    History of T1N1MX RIGHT BREAST CANCER BRCA2    Plan    Port removal.  Will need to follow for BRCA2 state.  The procedure has been discussed with the patient.  Alternative therapies have been discussed with the patient.  Operative risks include bleeding,  Infection,  Organ injury,  Nerve injury,  Blood vessel injury,  DVT,  Pulmonary embolism,  Death,  And possible reoperation.  Medical management risks include worsening of present situation.  .  The patient understands and agrees to proceed.       Nuchem Grattan A. 09/08/2011, 3:48 PM    

## 2011-09-24 NOTE — Discharge Instructions (Signed)
Shower tomorrow Resume full activity/ Glue will fall of  In 2 weeks.

## 2011-09-24 NOTE — Progress Notes (Signed)
Pt sitting up drinking and eating crackers.  Pt has no complaints.

## 2011-09-24 NOTE — Anesthesia Preprocedure Evaluation (Signed)
Anesthesia Evaluation  Patient identified by MRN, date of birth, ID band Patient awake    Reviewed: Allergy & Precautions, H&P , NPO status , Patient's Chart, lab work & pertinent test results  Airway Mallampati: II TM Distance: >3 FB Neck ROM: Full    Dental No notable dental hx.    Pulmonary neg pulmonary ROS,  breath sounds clear to auscultation  Pulmonary exam normal       Cardiovascular hypertension, Pt. on medications Rhythm:Regular Rate:Normal     Neuro/Psych negative neurological ROS  negative psych ROS   GI/Hepatic Neg liver ROS, GERD-  ,  Endo/Other  Diabetes mellitus-, Type 2  Renal/GU negative Renal ROS  negative genitourinary   Musculoskeletal negative musculoskeletal ROS (+)   Abdominal   Peds negative pediatric ROS (+)  Hematology negative hematology ROS (+)   Anesthesia Other Findings   Reproductive/Obstetrics negative OB ROS                           Anesthesia Physical Anesthesia Plan  ASA: II  Anesthesia Plan: MAC   Post-op Pain Management:    Induction: Intravenous  Airway Management Planned: Simple Face Mask  Additional Equipment:   Intra-op Plan:   Post-operative Plan:   Informed Consent: I have reviewed the patients History and Physical, chart, labs and discussed the procedure including the risks, benefits and alternatives for the proposed anesthesia with the patient or authorized representative who has indicated his/her understanding and acceptance.   Dental advisory given  Plan Discussed with: CRNA  Anesthesia Plan Comments:         Anesthesia Quick Evaluation

## 2011-09-24 NOTE — Op Note (Signed)
Preop diagnosis: Indwelling port a catheter for chemotherapy left subclavian  Postop diagnosis: Same  Procedure: Removal of port a catheter  Surgeon: [name] M.D.  Anesthesia: MAC with local  EBL: Minimal  Specimen none  Drains: None  Indications for procedure: The patient presents for removal of port a catheter after completing chemotherapy. The patient no longer requires central venous access. Risks of bleeding, infection, catheter fragmentation, embolization, arrhythmias and damage to arteries, veins and nerves and possibly other mediastinal structures discussed. The patient agrees to proceed.  Description of procedure: The patient was seen in the holding area. Questions were answered. The patient agreed to proceed. The patient was taken to the operating room. The patient was placed supine. Anesthesia was initiated. The skin on the upper chest was prepped and draped in a sterile fashion. Timeout was done. The patient received preoperative antibiotics. Incision was made through the old port site on the left and the hub of the Port-A-Cath was seen. The sutures were cut to release the port from the chest wall. The catheter was removed in its entirety without difficulty. The tract was closed with 3-0 Vicryl. 4 Monocryl was used to close the skin. All final counts were correct. The patient was taken to recovery in satisfactory condition.

## 2011-09-24 NOTE — Transfer of Care (Signed)
Immediate Anesthesia Transfer of Care Note  Patient: [name]  Procedure(s) Performed: Procedure(s) (LRB): REMOVAL PORT-A-CATH (N/A)  Patient Location: PACU  Anesthesia Type: mac  Level of Consciousness: awake and alert   Airway & Oxygen Therapy: Patient Spontanous Breathing and Patient connected to face mask oxygen  Post-op Assessment: Report given to PACU RN and Post -op Vital signs reviewed and stable  Post vital signs: Reviewed and stable  Complications: No apparent anesthesia complications

## 2011-09-24 NOTE — Progress Notes (Signed)
Pt up and ambulated to bathroom with stand by assist.  Pt voided moderate amount of clear yellow urine.

## 2011-09-26 ENCOUNTER — Encounter: Payer: Self-pay | Admitting: Radiation Oncology

## 2011-09-26 ENCOUNTER — Ambulatory Visit
Admission: RE | Admit: 2011-09-26 | Discharge: 2011-09-26 | Disposition: A | Discharge status: Home / Self Care | Payer: Self-pay | Source: Ambulatory Visit | Attending: Radiation Oncology | Admitting: Radiation Oncology

## 2011-09-26 VITALS — BP 152/89 | HR 98 | Temp 98.9°F | Resp 18 | Wt 158.6 lb

## 2011-09-26 DIAGNOSIS — C50119 Malignant neoplasm of central portion of unspecified female breast: Secondary | ICD-10-CM

## 2011-09-26 NOTE — Progress Notes (Signed)
HERE TODAY FOR FU RIGHT BREAST TX.  HAD MASTECTOMY.  SKIN LOOKS GOOD , STILL HAS SOME DRY DESQUAMATION AT [facility].  ALSO NOTED AREA ON LEFT UPPER CHEST OF PAC REMOVAL SITE , LOOKS GOOD.  BACK WORKING, ALSO CUTTING GRASS.  HAS NONPRODUCTIVE COUGH COUGH AND SAYS SHE HAS SINUS H/A.  NO OTHER C/O

## 2011-09-26 NOTE — Progress Notes (Signed)
Radiation Oncology         [phone] ________________________________  Name: [name] MRN: [id number]  Date: 09/26/2011  DOB: 09/27/1945  Follow-Up Visit Note Diagnosis:   Right breast cancer pathologic T1 C. N1 M0  Interval Since Last Radiation: Approximately one month  Narrative:  The patient returns today for routine follow-up.  She still has intermittent nausea, but a good appetite. I reviewed her most recent lab tests which don't show any clear explanation explanation for that.    She is taking Arimidex as prescribed. She is currently getting over a cold.       Port-A-Cath was recently removed.  ALLERGIES:  is allergic to metformin.  Meds: Current Outpatient Prescriptions  Medication Sig Dispense Refill  . anastrozole (ARIMIDEX) 1 MG tablet Take 1 tablet (1 mg total) by mouth daily.  30 tablet  12  . aspirin 81 MG tablet Take 81 mg by mouth daily.       . diclofenac sodium (VOLTAREN) 1 % GEL Apply 1 application topically 4 (four) times daily as needed (joint pain).  1 Tube  2  . fluticasone (FLONASE) 50 MCG/ACT nasal spray Place 2 sprays into the nose daily.  16 g  2  . glipiZIDE (GLUCOTROL) 10 MG tablet Take 1 tablet (10 mg total) by mouth 2 (two) times daily before a meal.  90 tablet  3  . hydrochlorothiazide (HYDRODIURIL) 25 MG tablet TAKE ONE TABLET BY MOUTH EVERY DAY  90 tablet  3  . HYDROcodone-acetaminophen (NORCO) 5-325 MG per tablet Take 1 tablet by mouth every 6 (six) hours as needed for pain.  30 tablet  0  . lisinopril (PRINIVIL,ZESTRIL) 40 MG tablet Take 1 tablet (40 mg total) by mouth daily.  30 tablet  11  . metFORMIN (GLUCOPHAGE) 500 MG tablet Take 1 tablet (500 mg total) by mouth 2 (two) times daily with a meal.  90 tablet  3  . Polyvinyl Alcohol-Povidone (REFRESH OP) Place 1 drop into both eyes daily at 12 noon.       . pravastatin (PRAVACHOL) 40 MG tablet Take 1 tablet (40 mg total) by mouth at bedtime.  90 tablet  5  . promethazine (PHENERGAN) 25 MG  tablet Take 1 tablet (25 mg total) by mouth every 6 (six) hours as needed for nausea.  30 tablet  1    Physical Findings: The patient is in no acute distress. Patient is alert and oriented. Weight is stable  weight is 158 lb 9.6 oz (71.94 kg). Her oral temperature is 98.9 F (37.2 C). Her blood pressure is 152/89 and her pulse is 98. Her respiration is 18. . Right breast is diffusely hyperpigmented with dry desquamation.   Lab Findings: Lab Results  Component Value Date   WBC 4.3 09/18/2011   HGB 11.0* 09/18/2011   HCT 33.5* 09/18/2011   MCV 91.5 09/18/2011   PLT 243 09/18/2011    CMP     Component Value Date/Time   NA 142 09/18/2011 1410   K 3.5 09/18/2011 1410   CL 104 09/18/2011 1410   CO2 26 09/18/2011 1410   GLUCOSE 168* 09/18/2011 1410   BUN 17 09/18/2011 1410   CREATININE 0.62 09/18/2011 1410   CALCIUM 9.4 09/18/2011 1410   PROT 7.0 09/18/2011 1410   ALBUMIN 4.2 09/18/2011 1410   AST 17 09/18/2011 1410   ALT 18 09/18/2011 1410   ALKPHOS 72 09/18/2011 1410   BILITOT 0.2* 09/18/2011 1410   GFRNONAA >90 09/18/2011 1410  GFRAA >90 09/18/2011 1410       Radiographic Findings: No results found.  Impression:  The patient is recovering from the effects of radiation.   Plan:  She'll followup with me on a when necessary basis. She had a blunted affect today, which was noticeable to me throughout her treatment as well. I talked to her in the past on several occasions about how she's doing emotionally. She she has denied and continues to denies any depression/ anxiety related to her diagnosis at this time.   I encouraged her to call me if she has any issues in the future. Otherwise she'll continue be followed by medical oncology as she continues her antiestrogen therapy.   -----------------------------------  Lonie Peak, MD

## 2011-10-01 ENCOUNTER — Encounter: Payer: Self-pay | Admitting: *Deleted

## 2011-10-01 ENCOUNTER — Ambulatory Visit (INDEPENDENT_AMBULATORY_CARE_PROVIDER_SITE_OTHER): Payer: Self-pay | Admitting: Internal Medicine

## 2011-10-01 ENCOUNTER — Encounter: Payer: Self-pay | Admitting: Internal Medicine

## 2011-10-01 VITALS — BP 140/84 | HR 106 | Temp 98.5°F | Ht 59.0 in | Wt 158.1 lb

## 2011-10-01 DIAGNOSIS — J019 Acute sinusitis, unspecified: Secondary | ICD-10-CM

## 2011-10-01 DIAGNOSIS — R059 Cough, unspecified: Secondary | ICD-10-CM | POA: Insufficient documentation

## 2011-10-01 DIAGNOSIS — B9689 Other specified bacterial agents as the cause of diseases classified elsewhere: Secondary | ICD-10-CM

## 2011-10-01 DIAGNOSIS — R05 Cough: Secondary | ICD-10-CM | POA: Insufficient documentation

## 2011-10-01 LAB — GLUCOSE, CAPILLARY: Glucose-Capillary: 210 mg/dL — ABNORMAL HIGH (ref 70–99)

## 2011-10-01 MED ORDER — GUAIFENESIN-CODEINE 100-10 MG/5ML PO SYRP: 5.0000 mL | ORAL_SOLUTION | Freq: Three times a day (TID) | ORAL | Status: DC | PRN

## 2011-10-01 MED ORDER — GUAIFENESIN-CODEINE 100-10 MG/5ML PO SYRP: 5.0000 mL | ORAL_SOLUTION | Freq: Three times a day (TID) | ORAL | Status: AC | PRN

## 2011-10-01 MED ORDER — DOXYCYCLINE HYCLATE 100 MG PO TABS: 100.0000 mg | ORAL_TABLET | Freq: Two times a day (BID) | ORAL | Status: AC

## 2011-10-01 NOTE — Patient Instructions (Signed)
Doxycycline is an antibiotic to treat your sinuses infection. Use as directed. Try not to miss any doses. Do not stop taking the antibiotic before the 7 days is up even if you're feeling better. The prescription cough syrup may make you feel sleepy. If you develop any fever(temperature greater than 101F), coughing up blood, difficulty breathing, chest pain, passing out, or other concerning symptoms, return to the clinic or go immediately to the emergency room.

## 2011-10-01 NOTE — Progress Notes (Signed)
date of service 08/11/2011  [employer], operational Note  Diagnosis: Breast Cancer  The patient's CT images from her initial simulation were reviewed to plan her boost treatment to her right chest wall scar.  Measurements were made regarding the size and depth of the boost target. The boost to the scar will be delivered with 6 MeV electrons prescribed to the 95 % isodose line. 1 cm bolus will be used daily. A special port plan was reviewed and approved. A custom electron cut-out will be used for her boost field.

## 2011-10-01 NOTE — Progress Notes (Signed)
[facility] Radiation Oncology End of Treatment Note  Name:[name]  Date: 08/26/2011 ZOX:[id number] DOB:Sep 27, 1945   Status:outpatient    DIAGNOSIS:  pT1cN1M0 ER positive PR weakly positive HER-2/neu negative high grade invasive ductal carcinoma of the right breast  INDICATION FOR TREATMENT: Curative   TREATMENT DATES: 07/15/2011 through 08/26/2011                          SITE/DOSE:                          #1 right chest wall: 5000 cGy in 25 fractions;  #2 right supraclavicular region: 4600 cGy in 23 fractions;  #3 right chest wall scar boost: 1000 cGy in 5 fractions  BEAMS/ENERGY:    #1 tangents forward planned: 6 and 10 MV photons      #2 left anterior oblique:      10 MV photons #3 electron boost : 6 MeV electrons   NARRATIVE:  Ms. Ceasar tolerated her treatments well without any complications.      She did have persistent nausea of unclear etiology. She developed diffuse hyperpigmentation throughout the chest wall. No moist desquamation.               PLAN: Routine followup in one month. Patient instructed to call if questions or worsening complaints in interim.

## 2011-10-01 NOTE — Progress Notes (Signed)
Pt called requesting an appointment C/o sore throat, ears hurt, productive cough. Denies fever. Onset few weeks. Has tried Cough syrup with some relief.   Will see today # 1:15

## 2011-10-01 NOTE — Progress Notes (Signed)
Patient ID: [name], female   DOB: Jun 14, 1945, 66 y.o.   MRN: [id number] ` Subjective:     HPI: Patient is a 66 year old female here today with acute complaint of cough, earache, and cold symptoms.  Patient reports a sore throat, increased sinus drainage, and cough for ~2 weeks.  She describes her cough as the result of sinus drainage; she denies her cough as productive of phlegm.  She denies fever, chills, hemoptysis, nausea, vomiting, diarrhea, or sick contacts.  She admits to sinus headache and sensation of ear fullness and pain.  She reports increased sinus drainage that is occasionally bloody. She denies any hearing loss or ear drainage.  She denies any other associates symptom or complaints.     Review of Systems Constitutional: Negative for fever, chills, diaphoresis, activity change, appetite change, fatigue and unexpected weight change.  HENT: Negative for hearing loss and neck stiffness.   Eyes: Negative for photophobia, pain and visual disturbance.  Respiratory: Negative for chest tightness, shortness of breath and wheezing.   Cardiovascular: Negative for chest pain and palpitations.  Gastrointestinal: Negative for abdominal pain, blood in stool and anal bleeding.  Genitourinary: Negative for dysuria, hematuria and difficulty urinating.  Musculoskeletal: Negative for joint swelling.  Neurological: Negative for dizziness, syncope, speech difficulty, weakness, and numbness     Objective:   Physical Exam VItal signs reviewed and stable. GEN: No apparent distress.  Alert and oriented x 3.  Pleasant, conversant, and cooperative to exam. HEENT: head is autraumatic and normocephalic.  Neck is supple without palpable masses or lymphadenopathy.  Vision intact.  EOMI.  PERRLA.  Sclerae anicteric.  Conjunctivae without pallor or injection. Mucous membranes are moist.  Oropharynx is without erythema, exudates, or other abnormal lesions.  No TTP of frontal or maxillary sinuses.   Bilateral ear canals clear; TMs wnl. RESP:  Lungs are clear to ascultation bilaterally with good air movement.  No wheezes, ronchi, or rubs. CARDIOVASCULAR: regular rate, normal rhythm.  Clear S1, S2, no murmurs, gallops, or rubs. ABDOMEN: soft, non-tender, non-distended.  Bowels sounds present in all quadrants and normoactive.  No palpable masses. SKIN: warm and dry with normal turgor.  No rashes or abnormal lesions observed.     Assessment/Plan:

## 2011-10-01 NOTE — Assessment & Plan Note (Signed)
This is likely the result of post nasal drip from acute bacterial sinusitis. Will prescribe Cheratussin for symptomatic relief.

## 2011-10-01 NOTE — Assessment & Plan Note (Signed)
Patient's symptoms are suggestive of acute bacterial sinusitis. She has had symptoms for greater than 10 days; treatment with empiric antibiotics is indicated. We'll treat with a seven-day course of doxycycline. We'll also write a prescription for Cheratussin for relief of cough. Patient is advised to return to clinic or to the emergency room should she develop fever, chills, shortness of breath, chest pain, or other concerning symptoms.

## 2011-10-07 ENCOUNTER — Ambulatory Visit
Admission: RE | Admit: 2011-10-07 | Discharge: 2011-10-07 | Disposition: A | Discharge status: Home / Self Care | Payer: No Typology Code available for payment source | Source: Ambulatory Visit | Attending: Obstetrics and Gynecology | Admitting: Obstetrics and Gynecology

## 2011-10-07 DIAGNOSIS — Z9011 Acquired absence of right breast and nipple: Secondary | ICD-10-CM

## 2011-10-07 DIAGNOSIS — Z1231 Encounter for screening mammogram for malignant neoplasm of breast: Secondary | ICD-10-CM

## 2011-10-15 ENCOUNTER — Telehealth: Payer: Self-pay | Admitting: *Deleted

## 2011-10-15 NOTE — Telephone Encounter (Signed)
Telephoned patient's home # and left message to return call to [phone].

## 2011-10-15 NOTE — Telephone Encounter (Signed)
Patient returned call and advised patient of negative diagnostic mammogram. Advised patient of screening mammogram in one year. Patient voiced understanding.

## 2011-12-02 ENCOUNTER — Telehealth: Payer: Self-pay | Admitting: *Deleted

## 2011-12-02 ENCOUNTER — Encounter: Payer: Self-pay | Admitting: Physician Assistant

## 2011-12-02 ENCOUNTER — Ambulatory Visit (HOSPITAL_BASED_OUTPATIENT_CLINIC_OR_DEPARTMENT_OTHER): Payer: Self-pay | Admitting: Physician Assistant

## 2011-12-02 ENCOUNTER — Other Ambulatory Visit (HOSPITAL_BASED_OUTPATIENT_CLINIC_OR_DEPARTMENT_OTHER): Payer: Self-pay | Admitting: Lab

## 2011-12-02 VITALS — BP 153/81 | HR 98 | Temp 98.5°F | Ht 59.0 in | Wt 160.3 lb

## 2011-12-02 DIAGNOSIS — C50919 Malignant neoplasm of unspecified site of unspecified female breast: Secondary | ICD-10-CM

## 2011-12-02 DIAGNOSIS — C773 Secondary and unspecified malignant neoplasm of axilla and upper limb lymph nodes: Secondary | ICD-10-CM

## 2011-12-02 DIAGNOSIS — C50319 Malignant neoplasm of lower-inner quadrant of unspecified female breast: Secondary | ICD-10-CM

## 2011-12-02 DIAGNOSIS — E559 Vitamin D deficiency, unspecified: Secondary | ICD-10-CM

## 2011-12-02 DIAGNOSIS — Z9010 Acquired absence of unspecified breast and nipple: Secondary | ICD-10-CM

## 2011-12-02 DIAGNOSIS — Z170 Estrogen receptor positive status [ER+]: Secondary | ICD-10-CM

## 2011-12-02 LAB — CBC WITH DIFFERENTIAL/PLATELET
BASO%: 0.6 % (ref 0.0–2.0)
Basophils Absolute: 0 10*3/uL (ref 0.0–0.1)
EOS%: 2.2 % (ref 0.0–7.0)
Eosinophils Absolute: 0.1 10*3/uL (ref 0.0–0.5)
HCT: 34.7 % — ABNORMAL LOW (ref 34.8–46.6)
HGB: 11.6 g/dL (ref 11.6–15.9)
LYMPH%: 26.6 % (ref 14.0–49.7)
MCH: 30.9 pg (ref 25.1–34.0)
MCHC: 33.4 g/dL (ref 31.5–36.0)
MCV: 92.3 fL (ref 79.5–101.0)
MONO#: 0.3 10*3/uL (ref 0.1–0.9)
MONO%: 8.1 % (ref 0.0–14.0)
NEUT#: 2.6 10*3/uL (ref 1.5–6.5)
NEUT%: 62.5 % (ref 38.4–76.8)
Platelets: 183 10*3/uL (ref 145–400)
RBC: 3.76 10*6/uL (ref 3.70–5.45)
RDW: 18.3 % — ABNORMAL HIGH (ref 11.2–14.5)
WBC: 4.1 10*3/uL (ref 3.9–10.3)
lymph#: 1.1 10*3/uL (ref 0.9–3.3)

## 2011-12-02 MED ORDER — VENLAFAXINE HCL ER 37.5 MG PO CP24: 37.5000 mg | ORAL_CAPSULE | Freq: Every day | ORAL | Status: DC

## 2011-12-02 NOTE — Telephone Encounter (Signed)
[facility] gave patient appointment for 05-2012

## 2011-12-02 NOTE — Progress Notes (Signed)
ID: [name]   DOB: 11-19-1945  MR#: [id number]  WUJ#:[id number]  HISTORY OF PRESENT ILLNESS: [name] had screening mammography Sep 11, 2010, at [facility]. This showed a possible right axillary mass and she was brought back for additional views Sep 20, 2010. An ultrasound showed abnormal enlarged lymph nodes in the right axilla measuring a maximum of 2.2 cm. Biopsy was suggested and performed on Sep 25, 2010. The pathology from that procedure ([id number]) showed metastatic adenocarcinoma consistent with a breast primary. In particular, the tumor was estrogen and progesterone receptor positive, HER2/neu was negative, the MIB-1 was 40%.  With this information, the patient was referred for bilateral breast MRIs performed May 29. This showed a complex picture, there being in addition to 2 pathologically enlarged lymph nodes in the right axilla and then other mildly prominent lymph nodes, 2 masses in the breast itself, one was subareolar and measured 1.3 cm, the second mass was more posterior and inferior and measured 8 mm. The distance between these 2 masses was 4.5 cm. the second breast mass was biopsied and was found to be benign.  She has a deleterious BRCA-2 mutation, namely 6696 deletion TC.  Status post right mastectomy and axillary lymph node dissection December 04, 2010 for a 1.5 cm invasive ductal carcinoma, grade 3, involving 3/12 axillary lymph nodes, with ample margins, T1c N1 M0, estrogen receptor 94% positive, progesterone receptor 4% positive with an MIB-1 of 62% and no HER2 amplification on the original material. Further therapy as detailed below.  INTERVAL HISTORY:  [name] returns today for followup of her right breast carcinoma. Since her last appointment here, she started on anastrozole, 1 mg daily. Overall, she is tolerating this well, although she has noticed an increase in hot flashes which occur both during the day and at night.   Otherwise, [name] feels she is "almost  back to normal". She staying very busy at home, doing her normal housework as well as shard work. She's also ready to get back into her regular exercise routine which includes daily walking.   REVIEW OF SYSTEMS:  [name] has had no recent illnesses and denies fevers or chills. Her energy level is good. She's had no nausea or change in bowel habits. Her blood sugars are well-controlled she tells me. No cough, increased shortness of breath, or chest pain. No abnormal headaches or dizziness. No unusual myalgias or arthralgias. She still has some residual neuropathy affecting the left thumb and the toes bilaterally. This is gradually improving, and she continues on vitamin B complex which she finds helpful.  Otherwise a detailed review of systems is stable and noncontributory.   PAST MEDICAL HISTORY: Past Medical History  Diagnosis Date  . Allergy   . Hypertension   . Hyperlipidemia   . Fatty liver disease, nonalcoholic   . History of hyperkalemia   . Obesity   . Renal lesion   . Hx antineoplastic chemo  10/12 - 06/24/11    PACLITAX EL COMPLETED 06/24/11  . Breast CA     (Rt) breast ca dx 4/12---  S/P RADICAL MASTECTOMY AND CHEMORADIATION  . Anemia   . Diabetes mellitus ORAL MED  . OA (osteoarthritis) of knee     RIGHT LEG    PAST SURGICAL HISTORY: Past Surgical History  Procedure Date  . Knee arthroscopy 10-28-1999    RIGHT  . Mastectomy modified radical 12-04-2010    W/ LEFT PAC PLACEMENT (RIGHT BREAST W/ AXILLARY CONTENTS/ NODE BX'S  . Transthoracic echocardiogram 10-29-2010  NORMAL LVF/ EF 55-60%/ MILD MV REGURG  . Vaginal hysterectomy AGE 52    W/ LSO  . Tubal ligation YRS AGO  . Laparoscopy with laparoscopic right salpingo oophorectomy and lysis of pelvic and abdominal adhesions March 2013    Dr. Jennette Kettle   . Portacath placement     REPLACED [facility] DUE TO MALFUNCTION (LEFT)  . Port-a-cath removal 09/24/2011    Procedure: REMOVAL PORT-A-CATH;  Surgeon: Clovis Pu. Cornett, MD;   Location: [facility];  Service: General;  Laterality: N/A;    FAMILY HISTORY Family History  Problem Relation Age of Onset  . Stroke Father   . Heart disease Sister   . Cancer Sister     breast  . Heart disease Brother   . Cancer Sister     COLON CANCER  patient's mother alive age 50; father deceased, cause unknown. The patient has 8 sisters and 9 brothers.   GYNECOLOGIC HISTORY: GX, P4, first pregnancy age 69; hysterectomy age 27 with USO; on premarin >20 years, discontinued at the time of breast cancer diagnosis  SOCIAL HISTORY: retired from office work; husband Derrick works as a Curator; 4 daughters, 3 in [place], [place]), 1 in [place] ([place]); 3 grandsons; attends [employer].   ADVANCED DIRECTIVES:  HEALTH MAINTENANCE: History  Substance Use Topics  . Smoking status: Never Smoker   . Smokeless tobacco: Never Used  . Alcohol Use: No     Colonoscopy: Approx 2011  PAP:  UTD   Bone density: Never  Lipid panel: followed by PCP, UTD  Allergies  Allergen Reactions  . Metformin Other (See Comments)    REACTION, diarrhea: 850 mg - diarrhea but tolerates 500 mg daily    Current Outpatient Prescriptions  Medication Sig Dispense Refill  . aspirin 81 MG tablet Take 81 mg by mouth daily.       . diclofenac sodium (VOLTAREN) 1 % GEL Apply 1 application topically 4 (four) times daily as needed (joint pain).  1 Tube  2  . fluticasone (FLONASE) 50 MCG/ACT nasal spray Place 2 sprays into the nose daily.  16 g  2  . glipiZIDE (GLUCOTROL) 10 MG tablet Take 1 tablet (10 mg total) by mouth 2 (two) times daily before a meal.  90 tablet  3  . hydrochlorothiazide (HYDRODIURIL) 25 MG tablet TAKE ONE TABLET BY MOUTH EVERY DAY  90 tablet  3  . lisinopril (PRINIVIL,ZESTRIL) 40 MG tablet Take 1 tablet (40 mg total) by mouth daily.  30 tablet  11  . metFORMIN (GLUCOPHAGE) 500 MG tablet Take 1 tablet (500 mg total) by mouth 2 (two) times daily with a meal.  90  tablet  3  . Polyvinyl Alcohol-Povidone (REFRESH OP) Place 1 drop into both eyes daily at 12 noon.       . pravastatin (PRAVACHOL) 40 MG tablet Take 1 tablet (40 mg total) by mouth at bedtime.  90 tablet  5  . promethazine (PHENERGAN) 25 MG tablet Take 1 tablet (25 mg total) by mouth every 6 (six) hours as needed for nausea.  30 tablet  1  . venlafaxine XR (EFFEXOR-XR) 37.5 MG 24 hr capsule Take 1 capsule (37.5 mg total) by mouth daily.  90 capsule  1    OBJECTIVE: Middle-aged [place] American woman in no acute distress  Filed Vitals:   12/02/11 1039  BP: 153/81  Pulse: 98  Temp: 98.5 F (36.9 C)     Body mass index is 32.38 kg/(m^2).  ECOG FS:0 Filed Weights   12/02/11 1039  Weight: 160 lb 4.8 oz (72.712 kg)   Physical Exam: HEENT:  Sclerae anicteric.  Oropharynx clear.   Nodes:  No cervical, supraclavicular, or axillary lymphadenopathy palpated.  Breast Exam:  Patient is status post right mastectomy with well-healed incision. Some hyperpigmentation status post radiation therapy, but no suspicious skin changes. No suspicious nodularity, or evidence of local recurrence. Left breast is benign no masses, skin changes, or nipple inversion.  Lungs:  Clear to auscultation bilaterally.  No crackles, rhonchi, or wheezes.   Heart:  Regular rate and rhythm.   Abdomen:  Soft, nontender.  Positive bowel sounds.  No organomegaly or masses palpated.   Musculoskeletal:  No focal spinal tenderness to palpation.  Extremities:  Benign.  No peripheral edema or cyanosis.   Skin:  Benign.   Neuro:  Nonfocal. Alert and oriented x3.   LAB RESULTS: Lab Results  Component Value Date   WBC 4.1 12/02/2011   NEUTROABS 2.6 12/02/2011   HGB 11.6 12/02/2011   HCT 34.7* 12/02/2011   MCV 92.3 12/02/2011   PLT 183 12/02/2011      Chemistry      Component Value Date/Time   NA 142 09/18/2011 1410   K 3.5 09/18/2011 1410   CL 104 09/18/2011 1410   CO2 26 09/18/2011 1410   BUN 17 09/18/2011 1410   CREATININE  0.62 09/18/2011 1410   GLU 132* 03/25/2011 0957      Component Value Date/Time   CALCIUM 9.4 09/18/2011 1410   ALKPHOS 72 09/18/2011 1410   AST 17 09/18/2011 1410   ALT 18 09/18/2011 1410   BILITOT 0.2* 09/18/2011 1410       Lab Results  Component Value Date   LABCA2 14 04/22/2011    STUDIES:  *RADIOLOGY REPORT*  Clinical Data: Screening.  LEFT DIGITAL SCREENING MAMMOGRAM WITH CAD  Comparison films: Multiple priors  Findings: The patient has had a right mastectomy. Two views of  the left breast demonstrate scattered fibroglandular densities. No  findings suspicious for malignancy.  Images were processed with CAD.  IMPRESSION:  No evidence of malignancy. Screening mammography is recommended in  one year.  BI-RADS CATEGORY 1: Negative  Original Report Authenticated By: Hiram Gash, M.D.    ASSESSMENT: 66 year old BRCA-2 positive [name] woman,  (1) status post right mastectomy and axillary lymph node dissection August of 2012 for a T1c N1 (Stage II) invasive ductal carcinoma, grade 3, estrogen and progesterone receptor positive, HER-2 negative, with an MIB-1-1 of 62%,   (2) Status post 4 cycles of doxorubicin and cyclophosphamide given in dose dense fashion, followed  by weekly paclitaxel x 12 completed 06/24/2011  (3) status post-mastectomy radiation completed April 2013  (4)  started on anastrozole in may 2013, the goal being to continue for 5 years.  PLAN:  With the exception of the increased hot flashes, [name] is tolerating anastrozole well. I am starting her on Effexor  XR., 37.5 mg daily in an attempt to decrease the hot flashes. With regards to her breast cancer, Alpa is doing well with no clinical evidence of disease recurrence. We'll plan on seeing her back in approximately 6 months, and we'll get a baseline bone density prior to that appointment.  [name] voices understanding and agreement with our plan, and will call with any changes or  problems.    [name]    12/02/2011

## 2011-12-08 ENCOUNTER — Ambulatory Visit (INDEPENDENT_AMBULATORY_CARE_PROVIDER_SITE_OTHER): Payer: Self-pay | Admitting: Internal Medicine

## 2011-12-08 ENCOUNTER — Encounter: Payer: Self-pay | Admitting: Internal Medicine

## 2011-12-08 VITALS — BP 135/80 | HR 92 | Temp 98.3°F | Ht 59.0 in | Wt 161.1 lb

## 2011-12-08 DIAGNOSIS — D649 Anemia, unspecified: Secondary | ICD-10-CM

## 2011-12-08 DIAGNOSIS — C50919 Malignant neoplasm of unspecified site of unspecified female breast: Secondary | ICD-10-CM

## 2011-12-08 DIAGNOSIS — C773 Secondary and unspecified malignant neoplasm of axilla and upper limb lymph nodes: Secondary | ICD-10-CM

## 2011-12-08 DIAGNOSIS — I10 Essential (primary) hypertension: Secondary | ICD-10-CM

## 2011-12-08 DIAGNOSIS — E785 Hyperlipidemia, unspecified: Secondary | ICD-10-CM

## 2011-12-08 DIAGNOSIS — Z79899 Other long term (current) drug therapy: Secondary | ICD-10-CM

## 2011-12-08 DIAGNOSIS — M1990 Unspecified osteoarthritis, unspecified site: Secondary | ICD-10-CM

## 2011-12-08 DIAGNOSIS — E119 Type 2 diabetes mellitus without complications: Secondary | ICD-10-CM

## 2011-12-08 LAB — GLUCOSE, CAPILLARY: Glucose-Capillary: 220 mg/dL — ABNORMAL HIGH (ref 70–99)

## 2011-12-08 LAB — POCT GLYCOSYLATED HEMOGLOBIN (HGB A1C): Hemoglobin A1C: 6.7

## 2011-12-08 MED ORDER — PRAVASTATIN SODIUM 40 MG PO TABS: 60.0000 mg | ORAL_TABLET | Freq: Every day | ORAL | Status: DC

## 2011-12-08 MED ORDER — LISINOPRIL 40 MG PO TABS: 40.0000 mg | ORAL_TABLET | Freq: Every day | ORAL | Status: DC

## 2011-12-08 NOTE — Assessment & Plan Note (Signed)
Lab Results  Component Value Date   HGBA1C 6.7 12/08/2011   HGBA1C 8.1* 03/11/2011   CREATININE 0.62 09/18/2011   MICROALBUR 1.03 01/20/2011   MICRALBCREAT 4.9 01/20/2011   CHOL 163 09/16/2011   HDL 45 09/16/2011   TRIG 78 09/16/2011    Last eye exam and foot exam:    Component Value Date/Time   HMDIABEYEEXA No Diabetic Retinopathy 10/27/2008   HMDIABFOOTEX yes 03/19/2010   HMDIABFOOTEX Normal 03/19/2010   HMDIABFOOTEX Yes- Normal 03/19/2010    Assessment: Diabetes control: controlled Progress toward goals: deteriorated Barriers to meeting goals: no barriers identified  Plan: Diabetes treatment: continue current medications metformin 500 BID and glipizide 10 BID; if HbA1c>7 at next visit will discuss diet mod vs increasing glipizide vs XR metformin (fewer SE?), vs initiation of insulin Refer to: none and patient would not like to visit with dietician at this time Instruction/counseling given: reminded to bring blood glucose meter & log to each visit, reminded to bring medications to each visit, discussed the need for weight loss and discussed diet

## 2011-12-08 NOTE — Assessment & Plan Note (Signed)
LDL = 102 in 09/2011 Increase to Pravastatin 60 (1.5 tab) qHS and diet mod

## 2011-12-08 NOTE — Assessment & Plan Note (Addendum)
S/p mastectomy and chemo, currently on anastrozole; effexor recently started to help with hot flashes.

## 2011-12-08 NOTE — Assessment & Plan Note (Signed)
Normocytic anemia stable (Hb=11.6 MCV=92.3 on 12/02/11), colonoscopy done 10/2009 negative for malignancy; likely d/t breast Ca and chemo treatment, continue to monitor, no s/s today.

## 2011-12-08 NOTE — Progress Notes (Signed)
Subjective:   Patient ID: [name] female   DOB: 10/07/1945 66 y.o.   MRN: [id number]  HPI: [name] is a 66 y.o. woman with history of breast cancer (invasive ductal carcinoma with deleterious BRCA-2 mutation, status post right mastectomy and axillary lymph node dissection August 2012, ER 94% positive PR 4% positive and no HER-2 amplification), completed chemotherapy in April 2012 and currently on anastrozole, hypertension, hyperlipidemia, and diabetes who presents for  Diabetes: Sugars have been somewhat controlled. She checks her CBGs when she remembers to, most likely a few times a week. She does not her glucometer with her today. She reports that she thinks that the glucometer does not work appropriately, as occasionally she cannot get her read. The lowest CBG that she is seen within the 120s, the highest has been in the 225. She thinks she averages about 150. She notes symptoms of hypoglycemia, specifically feeling weak, if she waits too long to eat, but this happens infrequently. She reports polydipsia occasionally, when she is out, and she does not have her glucometer with her. She denies polyuria and polyphagia. She denies headaches, dizziness, diaphoresis, shortness of breath. Her appetite is improved since last visit (breakfast-serial, all milk, bagel, and cream cheese; lunch-fast food; dinner-fast food, but she is also cooking more which includes potatoes, beans, and fried chicken).  She is not interested in meeting with a dietitian. She reports compliance with metformin and glipizide. She occasionally exercises by walking daily and at the street. She brought in the results of her last eye exam today.  Hypertension: She denies symptoms of headache, chest pain, shortness of breath. She is intermittently compliant with her antihypertensive regimen.  Osteoarthritis: Joints (specifically knees) have not been bothersome, and when they are she usually just has to ignore it. She  has not been taking Tylenol or Advil. She has not been using voltaren.    Current Outpatient Prescriptions  Medication Sig Dispense Refill  . aspirin 81 MG tablet Take 81 mg by mouth daily.       . diclofenac sodium (VOLTAREN) 1 % GEL Apply 1 application topically 4 (four) times daily as needed (joint pain).  1 Tube  2  . fluticasone (FLONASE) 50 MCG/ACT nasal spray Place 2 sprays into the nose daily.  16 g  2  . glipiZIDE (GLUCOTROL) 10 MG tablet Take 1 tablet (10 mg total) by mouth 2 (two) times daily before a meal.  90 tablet  3  . hydrochlorothiazide (HYDRODIURIL) 25 MG tablet TAKE ONE TABLET BY MOUTH EVERY DAY  90 tablet  3  . lisinopril (PRINIVIL,ZESTRIL) 40 MG tablet Take 1 tablet (40 mg total) by mouth daily.  30 tablet  11  . metFORMIN (GLUCOPHAGE) 500 MG tablet Take 1 tablet (500 mg total) by mouth 2 (two) times daily with a meal.  90 tablet  3  . Polyvinyl Alcohol-Povidone (REFRESH OP) Place 1 drop into both eyes daily at 12 noon.       . pravastatin (PRAVACHOL) 40 MG tablet Take 1 tablet (40 mg total) by mouth at bedtime.  90 tablet  5  . promethazine (PHENERGAN) 25 MG tablet Take 1 tablet (25 mg total) by mouth every 6 (six) hours as needed for nausea.  30 tablet  1  . venlafaxine XR (EFFEXOR-XR) 37.5 MG 24 hr capsule Take 1 capsule (37.5 mg total) by mouth daily.  90 capsule  1   Review of Systems: Constitutional: Denies fever, chills, diaphoresis, and fatigue.  HEENT: Denies  photophobia, eye pain, redness, hearing loss, ear pain, congestion, sore throat, rhinorrhea, sneezing, mouth sores, trouble swallowing, neck pain, neck stiffness and tinnitus.   Respiratory: Denies SOB, DOE, cough, chest tightness,  and wheezing.   Cardiovascular: Denies chest pain, palpitations and leg swelling.  Gastrointestinal: Denies nausea, vomiting, abdominal pain, diarrhea, constipation, blood in stool and abdominal distention.  Genitourinary: Denies dysuria, urgency, frequency, hematuria, flank  pain and difficulty urinating.  Musculoskeletal: Denies myalgias and gait problem.  Skin: Denies pallor, rash and wound.  Neurological: Denies dizziness, seizures, syncope, weakness, light-headedness, numbness and headaches.  Psychiatric/Behavioral: Denies suicidal ideation, mood changes, confusion, nervousness, sleep disturbance and agitation  Objective:  Physical Exam: Filed Vitals:   12/08/11 0817  BP: 152/83  Pulse: 91  Temp: 98.3 F (36.8 C)  TempSrc: Oral  Weight: 161 lb 1.6 oz (73.074 kg)   Constitutional: Vital signs reviewed.  Patient is a well-developed and well-nourished woman in no acute distress and cooperative with exam.  Eyes: PERRL, EOMI, conjunctivae normal, No scleral icterus.  Cardiovascular: RRR, S1 normal, S2 normal, no MRG, pulses symmetric and intact bilaterally Pulmonary/Chest: CTAB, no wheezes, rales, or rhonchi Abdominal: Soft. Non-tender, non-distended, bowel sounds are normal, no masses, organomegaly, or guarding present.  Musculoskeletal: No joint deformities, erythema, or stiffness, ROM full and no nontender Neurological: A&O x3, Strength is normal and symmetric bilaterally, cranial nerve II-XII are grossly intact, no focal motor deficit, sensory intact to light touch bilaterally.  Skin: Warm, dry and intact. No rash, cyanosis, or clubbing.  Psychiatric: Normal mood and affect. speech and behavior is normal. Judgment and thought content normal. Cognition and memory are normal.   Assessment & Plan:  Case and care discussed with Dr. Meredith Pel Patient to return in 3 months for diabetes followup. Please see problem-oriented charting for further details.

## 2011-12-08 NOTE — Assessment & Plan Note (Signed)
Not problematic, discontinued Voltaren gel as patient had not been using it, but may be added back on if needed

## 2011-12-08 NOTE — Patient Instructions (Addendum)
-  Please increase pravastatin to 60 mg before bed (1.5 tablets) - if you have any symptoms such as muscle cramping or abdominal pain please stop taking the medication and call the clinic as soon as possible  -Your hemoglobin A1c is 6.7 today, our goal is less than 7. You're doing great, but try to reduce some of your carbohydrate intake.  Please be sure to bring all of your medications with you to every visit.  Should you have any new or worsening symptoms, please be sure to call the clinic at [phone].  Congratulations on overcoming your breast cancer!!!

## 2011-12-08 NOTE — Assessment & Plan Note (Signed)
Lab Results  Component Value Date   NA 142 09/18/2011   K 3.5 09/18/2011   CL 104 09/18/2011   CO2 26 09/18/2011   BUN 17 09/18/2011   CREATININE 0.62 09/18/2011    BP Readings from Last 3 Encounters:  12/08/11 135/80  12/02/11 153/81  10/01/11 140/84    Assessment: Hypertension control:  controlled  Progress toward goals:  at goal Barriers to meeting goals:  lack of understanding of disease management and patient takes medications intermittently, but generally doesn't skip 2 days in a row  Plan: Hypertension treatment:  continue current medications which includes lisinopril 40 and HCTZ 25

## 2011-12-11 ENCOUNTER — Ambulatory Visit (INDEPENDENT_AMBULATORY_CARE_PROVIDER_SITE_OTHER): Payer: Self-pay | Admitting: Surgery

## 2011-12-11 ENCOUNTER — Encounter (INDEPENDENT_AMBULATORY_CARE_PROVIDER_SITE_OTHER): Payer: Self-pay | Admitting: Surgery

## 2011-12-11 VITALS — BP 160/88 | HR 96 | Temp 97.0°F | Resp 20 | Ht 59.0 in | Wt 160.2 lb

## 2011-12-11 DIAGNOSIS — Z853 Personal history of malignant neoplasm of breast: Secondary | ICD-10-CM

## 2011-12-11 NOTE — Progress Notes (Signed)
NAME: [name]       DOB: May 03, 1946           DATE: 12/11/2011       MRN: [id number]   [name] is a 66 y.o.Marland Kitchenfemale who presents for routine followup of her Stage II right breast cancerdiagnosed in 2012  and treated with right modified mastectomy  Chemotherapy and radiation therapy. She has no problems or concerns on either side.she has interest in plastic surgery consultation for reconstruction.  PFSH: She has had no significant changes since the last visit here.  ROS: There have been no significant changes since the last visit here  EXAM:  VS: BP 160/88  Pulse 96  Temp 97 F (36.1 C) (Temporal)  Resp 20  Ht 4\' 11"  (1.499 m)  Wt 160 lb 3.2 oz (72.666 kg)  BMI 32.36 kg/m2   General: The patient is alert, oriented, generally healthy appearing, NAD. Mood and affect are normal.  Breasts:  right breast surgically absent. Skin redundancy noted. No masses. Left breast normal. Left nipple normal.  Lymphatics: She has no axillary or supraclavicular adenopathy on either side.  Extremities: Full ROM of the surgical side with no lymphedema noted.  Data Reviewed: LEFT DIGITAL SCREENING MAMMOGRAM WITH CAD  Comparison films: Multiple priors  Findings: The patient has had a right mastectomy. Two views of  the left breast demonstrate scattered fibroglandular densities. No  findings suspicious for malignancy.  Images were processed with CAD.  IMPRESSION:  No evidence of malignancy. Screening mammography is recommended in  one year.  BI-RADS CATEGORY 1: Negative  Original Report Authenticated By: Hiram Gash, M.D.      Impression: Doing well, with no evidence of recurrent cancer or new cancer  Plan: Will continue to follow up on an annual basis here.

## 2011-12-11 NOTE — Patient Instructions (Signed)
Return 1 year.  Refer to Engineer, petroleum.

## 2011-12-12 ENCOUNTER — Telehealth (INDEPENDENT_AMBULATORY_CARE_PROVIDER_SITE_OTHER): Payer: Self-pay

## 2011-12-12 NOTE — Telephone Encounter (Signed)
Called patient to give her appointment information or plastic surgery. Her appointment is scheduled with Dr. Kelly Splinter for Aug. 20th @ 4:00pm. No answer so if patient returns phone call please give her appointment information.

## 2011-12-18 ENCOUNTER — Ambulatory Visit (INDEPENDENT_AMBULATORY_CARE_PROVIDER_SITE_OTHER): Payer: Self-pay | Admitting: Internal Medicine

## 2011-12-18 ENCOUNTER — Telehealth: Payer: Self-pay | Admitting: *Deleted

## 2011-12-18 ENCOUNTER — Encounter: Payer: Self-pay | Admitting: Internal Medicine

## 2011-12-18 VITALS — BP 139/84 | HR 83 | Temp 97.4°F | Ht 59.0 in | Wt 160.9 lb

## 2011-12-18 DIAGNOSIS — I10 Essential (primary) hypertension: Secondary | ICD-10-CM

## 2011-12-18 DIAGNOSIS — L240 Irritant contact dermatitis due to detergents: Secondary | ICD-10-CM

## 2011-12-18 DIAGNOSIS — L258 Unspecified contact dermatitis due to other agents: Secondary | ICD-10-CM

## 2011-12-18 DIAGNOSIS — E119 Type 2 diabetes mellitus without complications: Secondary | ICD-10-CM

## 2011-12-18 DIAGNOSIS — E1169 Type 2 diabetes mellitus with other specified complication: Secondary | ICD-10-CM

## 2011-12-18 LAB — CBC
HCT: 34 % — ABNORMAL LOW (ref 36.0–46.0)
Hemoglobin: 11.7 g/dL — ABNORMAL LOW (ref 12.0–15.0)
MCH: 30.4 pg (ref 26.0–34.0)
MCHC: 34.4 g/dL (ref 30.0–36.0)
MCV: 88.3 fL (ref 78.0–100.0)
Platelets: 213 10*3/uL (ref 150–400)
RBC: 3.85 MIL/uL — ABNORMAL LOW (ref 3.87–5.11)
RDW: 17.2 % — ABNORMAL HIGH (ref 11.5–15.5)
WBC: 4.8 10*3/uL (ref 4.0–10.5)

## 2011-12-18 LAB — GLUCOSE, CAPILLARY: Glucose-Capillary: 149 mg/dL — ABNORMAL HIGH (ref 70–99)

## 2011-12-18 MED ORDER — DIPHENHYDRAMINE HCL 25 MG PO CAPS: 25.0000 mg | ORAL_CAPSULE | ORAL | Status: DC | PRN

## 2011-12-18 NOTE — Assessment & Plan Note (Signed)
Rash most likely due to soap, but given distribution, cannot rule out [facility] spotted fever. She denies systemic illness. She also notes that her and her husband make a habit of looking for ticks, as he works outside.  -Treat pruritus with Benadryl 25-50 mg every 4-6 hours as needed; she feels that this makes her too drowsy, she can take one Claritin a day. -CBC, his platelets are low, can consider treating for [facility] spotted fever

## 2011-12-18 NOTE — Telephone Encounter (Signed)
Thank you :)

## 2011-12-18 NOTE — Telephone Encounter (Signed)
Walk-in to the clinic. States red rash started Monday night after taking a shower. Red, itchy rash on arms and legs - requesting to be seen. Appt available with Dr Everardo Beals at 1015AM this morning.

## 2011-12-18 NOTE — Patient Instructions (Signed)
-  Take 1-2 capsules (25-50mg ) of benadryl (diphenhydramine) every 4-6 hours as needed for itching.  -If your rash gets worse, or re-occurs please call the clinic.   Please be sure to bring all of your medications with you to every visit.  Should you have any new or worsening symptoms, please be sure to call the clinic at [phone].

## 2011-12-18 NOTE — Assessment & Plan Note (Signed)
Lab Results  Component Value Date   NA 142 09/18/2011   K 3.5 09/18/2011   CL 104 09/18/2011   CO2 26 09/18/2011   BUN 17 09/18/2011   CREATININE 0.62 09/18/2011    BP Readings from Last 3 Encounters:  12/18/11 139/84  12/11/11 160/88  12/08/11 135/80    Assessment: Hypertension control:  controlled  Progress toward goals:  at goal Barriers to meeting goals:  no barriers identified  Plan: Hypertension treatment:  continue current medications (lisinopril 40 mg daily and hydrochlorothiazide 25 mg daily)

## 2011-12-18 NOTE — Progress Notes (Signed)
Subjective:   Patient ID: [name] female   DOB: [birth year] 66 y.o.   MRN: [id number]  HPI: [name] is a 66 y.o. woman with history of diabetes, hypertension, hyperlipidemia, and breast cancer who presents today for acute visit.  Rash: Acute onset, 4 days prior, diffuse distribution among bilateral arms legs and back, pruritic to the point that she couldn't sleep well last night; has not tried anything; never had a similar reaction before; denies working outside in the yard around poison ivy or poison oak; has not noticed any ticks or isolated a bug bite (she reports that her and her husband make a habit of checking for ticks, as her husband works frequently outside); nobody around her has a similar rash; she reports that on the day of onset, she use a shower gel she has not used in a long time (gave him shower gel), then she went to church and when she returned home she had this diffuse hive-like rash; it is improving, she reports that it was more welted prior; no fever, no fatigue, no shortness of breath, no headaches.   Past Medical History  Diagnosis Date  . Allergy   . Hypertension   . Hyperlipidemia   . Fatty liver disease, nonalcoholic   . History of hyperkalemia   . Obesity   . Renal lesion   . Hx antineoplastic chemo  10/12 - 06/24/11    PACLITAX EL COMPLETED 06/24/11  . Breast CA     (Rt) breast ca dx 4/12---  S/P RADICAL MASTECTOMY AND CHEMORADIATION  . Anemia   . Diabetes mellitus ORAL MED  . OA (osteoarthritis) of knee     RIGHT LEG  . Mild nonproliferative diabetic retinopathy of right eye 03/19/11    Dr. Norva Pavlov   Current Outpatient Prescriptions  Medication Sig Dispense Refill  . aspirin 81 MG tablet Take 81 mg by mouth daily.       . fluticasone (FLONASE) 50 MCG/ACT nasal spray Place 2 sprays into the nose daily.  16 g  2  . glipiZIDE (GLUCOTROL) 10 MG tablet Take 1 tablet (10 mg total) by mouth 2 (two) times daily before a meal.  90  tablet  3  . hydrochlorothiazide (HYDRODIURIL) 25 MG tablet TAKE ONE TABLET BY MOUTH EVERY DAY  90 tablet  3  . lisinopril (PRINIVIL,ZESTRIL) 40 MG tablet Take 1 tablet (40 mg total) by mouth daily.  90 tablet  5  . metFORMIN (GLUCOPHAGE) 500 MG tablet Take 1 tablet (500 mg total) by mouth 2 (two) times daily with a meal.  90 tablet  3  . Polyvinyl Alcohol-Povidone (REFRESH OP) Place 1 drop into both eyes daily at 12 noon.       . pravastatin (PRAVACHOL) 40 MG tablet Take 1.5 tablets (60 mg total) by mouth at bedtime.  90 tablet  5  . venlafaxine XR (EFFEXOR-XR) 37.5 MG 24 hr capsule Take 1 capsule (37.5 mg total) by mouth daily.  90 capsule  1  . diphenhydrAMINE (BENADRYL) 25 mg capsule Take 1 capsule (25 mg total) by mouth every 4 (four) hours as needed for itching.  24 capsule  2   Family History  Problem Relation Age of Onset  . Stroke Father   . Heart disease Sister   . Cancer Sister     breast  . Heart disease Brother   . Cancer Sister     COLON CANCER   History   Social History  . Marital Status:  Married    Spouse Name: N/A    Number of Children: N/A  . Years of Education: N/A   Social History Main Topics  . Smoking status: Never Smoker   . Smokeless tobacco: Never Used  . Alcohol Use: No  . Drug Use: No  . Sexually Active: Yes    Birth Control/ Protection: Surgical     MENARCHE AGE 51, G4, P4, PARITY AGE 60, HRT 20+ YEARS     Other Topics Concern  . 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  Patient needs to submit further paperwork to complete- Rudell Cobb 08/14/09.Financial assistance application completed.  Patient does not qualify for assistance-over income Rudell Cobb 09/12/09.   Review of Systems: General: no fevers, chills, changes in weight, changes in appetite Skin: As per history of present illness HEENT: no  blurry vision, hearing changes, sore throat Pulm: no dyspnea, coughing, wheezing CV: no chest pain, palpitations, shortness of breath Abd: no abdominal pain, nausea/vomiting, diarrhea/constipation GU: no dysuria, hematuria, polyuria Ext: no arthralgias, myalgias Neuro: no weakness, numbness, or tingling   Objective:  Physical Exam: Filed Vitals:   12/18/11 0920 12/18/11 1030  BP: 146/85 139/84  Pulse: 92 83  Temp: 97.4 F (36.3 C)   TempSrc: Oral   Height: 4\' 11"  (1.499 m)   Weight: 160 lb 14.4 oz (72.984 kg)    Constitutional: Vital signs reviewed.  Patient is a well-developed and well-nourished woman in no acute distress and cooperative with exam.  Mouth: no erythema or exudates, MMM Eyes: PERRL, EOMI, conjunctivae normal, No scleral icterus.  Neck: Supple, Trachea midline normal ROM, No JVD, mass, thyromegaly, or carotid bruit present.  Cardiovascular: RRR, S1 normal, S2 normal, no MRG, pulses symmetric and intact bilaterally Pulmonary/Chest: CTAB, no wheezes, rales, or rhonchi Abdominal: Soft. Non-tender, non-distended, bowel sounds are normal, no masses, organomegaly, or guarding present.  Musculoskeletal: No joint deformities, erythema, or stiffness, ROM full and no nontender Neurological: A&O x3, Strength is normal and symmetric bilaterally, cranial nerve II-XII are grossly intact, no focal motor deficit, sensory intact to light touch bilaterally.  Skin: Very faint papular whelp like rashes along bilateral upper extremities lower extremities and patient's back -mildly erythematous without exudates; no individual whelp is greater than 2 cm; I cannot identify a source of puncture from an insect  Psychiatric: Normal mood and affect. speech and behavior is normal. Judgment and thought content normal. Cognition and memory are normal.   Assessment & Plan:   Case and care discussed with Dr. Eben Burow. Patient to return in 3 months for routine diabetes and hypertension followup. Please  see problem-oriented charting for further details.

## 2011-12-19 ENCOUNTER — Ambulatory Visit
Admission: RE | Admit: 2011-12-19 | Discharge: 2011-12-19 | Disposition: A | Discharge status: Home / Self Care | Payer: Self-pay | Source: Ambulatory Visit | Attending: Physician Assistant | Admitting: Physician Assistant

## 2011-12-19 DIAGNOSIS — C50919 Malignant neoplasm of unspecified site of unspecified female breast: Secondary | ICD-10-CM

## 2012-02-08 ENCOUNTER — Other Ambulatory Visit: Payer: Self-pay | Admitting: Internal Medicine

## 2012-02-20 ENCOUNTER — Other Ambulatory Visit: Payer: Self-pay | Admitting: Internal Medicine

## 2012-03-03 ENCOUNTER — Other Ambulatory Visit: Payer: Self-pay | Admitting: Nurse Practitioner

## 2012-03-10 ENCOUNTER — Encounter (HOSPITAL_COMMUNITY): Payer: Self-pay | Admitting: Family Medicine

## 2012-03-10 ENCOUNTER — Observation Stay (HOSPITAL_COMMUNITY)
Admission: EM | Admit: 2012-03-10 | Discharge: 2012-03-11 | Disposition: A | Discharge status: Home / Self Care | Payer: Self-pay | Attending: Emergency Medicine | Admitting: Emergency Medicine

## 2012-03-10 ENCOUNTER — Emergency Department (HOSPITAL_COMMUNITY): Payer: Self-pay

## 2012-03-10 ENCOUNTER — Observation Stay (HOSPITAL_COMMUNITY): Payer: Self-pay

## 2012-03-10 ENCOUNTER — Telehealth: Payer: Self-pay | Admitting: *Deleted

## 2012-03-10 DIAGNOSIS — I152 Hypertension secondary to endocrine disorders: Secondary | ICD-10-CM | POA: Diagnosis present

## 2012-03-10 DIAGNOSIS — E785 Hyperlipidemia, unspecified: Secondary | ICD-10-CM | POA: Diagnosis present

## 2012-03-10 DIAGNOSIS — N390 Urinary tract infection, site not specified: Secondary | ICD-10-CM

## 2012-03-10 DIAGNOSIS — M1990 Unspecified osteoarthritis, unspecified site: Secondary | ICD-10-CM | POA: Diagnosis present

## 2012-03-10 DIAGNOSIS — E1159 Type 2 diabetes mellitus with other circulatory complications: Secondary | ICD-10-CM | POA: Diagnosis present

## 2012-03-10 DIAGNOSIS — M6281 Muscle weakness (generalized): Principal | ICD-10-CM | POA: Insufficient documentation

## 2012-03-10 DIAGNOSIS — R350 Frequency of micturition: Secondary | ICD-10-CM | POA: Insufficient documentation

## 2012-03-10 DIAGNOSIS — E119 Type 2 diabetes mellitus without complications: Secondary | ICD-10-CM | POA: Insufficient documentation

## 2012-03-10 DIAGNOSIS — R079 Chest pain, unspecified: Secondary | ICD-10-CM | POA: Diagnosis present

## 2012-03-10 DIAGNOSIS — R0602 Shortness of breath: Secondary | ICD-10-CM | POA: Insufficient documentation

## 2012-03-10 DIAGNOSIS — I10 Essential (primary) hypertension: Secondary | ICD-10-CM | POA: Insufficient documentation

## 2012-03-10 DIAGNOSIS — R911 Solitary pulmonary nodule: Secondary | ICD-10-CM

## 2012-03-10 DIAGNOSIS — Z8249 Family history of ischemic heart disease and other diseases of the circulatory system: Secondary | ICD-10-CM | POA: Insufficient documentation

## 2012-03-10 DIAGNOSIS — R110 Nausea: Secondary | ICD-10-CM | POA: Insufficient documentation

## 2012-03-10 DIAGNOSIS — R197 Diarrhea, unspecified: Secondary | ICD-10-CM | POA: Insufficient documentation

## 2012-03-10 LAB — COMPREHENSIVE METABOLIC PANEL
ALT: 21 U/L (ref 0–35)
AST: 16 U/L (ref 0–37)
Albumin: 4.7 g/dL (ref 3.5–5.2)
Alkaline Phosphatase: 63 U/L (ref 39–117)
BUN: 14 mg/dL (ref 6–23)
CO2: 31 mEq/L (ref 19–32)
Calcium: 10.1 mg/dL (ref 8.4–10.5)
Chloride: 97 mEq/L (ref 96–112)
Creatinine, Ser: 0.64 mg/dL (ref 0.50–1.10)
GFR calc Af Amer: >90 mL/min (ref >90)
GFR calc non Af Amer: >90 mL/min (ref >90)
Glucose, Bld: 108 mg/dL — ABNORMAL HIGH (ref 70–99)
Potassium: 3.5 mEq/L (ref 3.5–5.1)
Sodium: 140 mEq/L (ref 135–145)
Total Bilirubin: 0.4 mg/dL (ref 0.3–1.2)
Total Protein: 7.5 g/dL (ref 6.0–8.3)

## 2012-03-10 LAB — CBC WITH DIFFERENTIAL/PLATELET
Basophils Absolute: 0 10*3/uL (ref 0.0–0.1)
Basophils Relative: 1 % (ref 0–1)
Eosinophils Absolute: 0.1 10*3/uL (ref 0.0–0.7)
Eosinophils Relative: 1 % (ref 0–5)
HCT: 36.5 % (ref 36.0–46.0)
Hemoglobin: 12.3 g/dL (ref 12.0–15.0)
Lymphocytes Relative: 33 % (ref 12–46)
Lymphs Abs: 1.3 10*3/uL (ref 0.7–4.0)
MCH: 31 pg (ref 26.0–34.0)
MCHC: 33.7 g/dL (ref 30.0–36.0)
MCV: 91.9 fL (ref 78.0–100.0)
Monocytes Absolute: 0.2 10*3/uL (ref 0.1–1.0)
Monocytes Relative: 5 % (ref 3–12)
Neutro Abs: 2.4 10*3/uL (ref 1.7–7.7)
Neutrophils Relative %: 60 % (ref 43–77)
Platelets: 244 10*3/uL (ref 150–400)
RBC: 3.97 MIL/uL (ref 3.87–5.11)
RDW: 16.1 % — ABNORMAL HIGH (ref 11.5–15.5)
WBC: 3.9 10*3/uL — ABNORMAL LOW (ref 4.0–10.5)

## 2012-03-10 LAB — URINALYSIS, ROUTINE W REFLEX MICROSCOPIC
Bilirubin Urine: NEGATIVE
Glucose, UA: NEGATIVE mg/dL
Hgb urine dipstick: NEGATIVE
Ketones, ur: NEGATIVE mg/dL
Nitrite: NEGATIVE
Protein, ur: NEGATIVE mg/dL
Specific Gravity, Urine: 1.025 (ref 1.005–1.030)
Urobilinogen, UA: 1 mg/dL (ref 0.0–1.0)
pH: 6 (ref 5.0–8.0)

## 2012-03-10 LAB — POCT I-STAT TROPONIN I
Troponin i, poc: 0.01 ng/mL (ref 0.00–0.08)
Troponin i, poc: 0 ng/mL (ref 0.00–0.08)

## 2012-03-10 LAB — URINE MICROSCOPIC-ADD ON

## 2012-03-10 MED ORDER — HYDROCODONE-ACETAMINOPHEN 5-325 MG PO TABS: 2.0000 | ORAL_TABLET | Freq: Once | ORAL | Status: DC

## 2012-03-10 MED ORDER — ACETAMINOPHEN 325 MG PO TABS
975.0000 mg | ORAL_TABLET | Freq: Once | ORAL | Status: AC
  Administered 2012-03-10: 975 mg via ORAL
  Filled 2012-03-10: qty 3

## 2012-03-10 MED ORDER — CEPHALEXIN 500 MG PO CAPS: 500.0000 mg | ORAL_CAPSULE | Freq: Two times a day (BID) | ORAL | Status: DC

## 2012-03-10 MED ORDER — DEXTROSE 5 % IV SOLN
1.0000 g | Freq: Once | INTRAVENOUS | Status: AC
  Administered 2012-03-10: 1 g via INTRAVENOUS
  Filled 2012-03-10: qty 10

## 2012-03-10 MED ORDER — IOHEXOL 350 MG/ML SOLN
100.0000 mL | Freq: Once | INTRAVENOUS | Status: AC | PRN
  Administered 2012-03-10: 100 mL via INTRAVENOUS

## 2012-03-10 MED ORDER — SODIUM CHLORIDE 0.9 % IV SOLN
1000.0000 mL | INTRAVENOUS | Status: DC
  Administered 2012-03-10: 1000 mL via INTRAVENOUS

## 2012-03-10 NOTE — ED Provider Notes (Signed)
Patient moved to CDU under chest pain protocol. Patient resting comfortably at present without return of chest pain. Lungs CTA bilaterally. S1/S2, RRR, no murmur. Abdomen soft, bowel sounds present. Strong distal pulses palpated all extremities. Sinus rhythm on monitor without ectopy. Troponin negative x 2. 12 lead reviewed, no indication of ischemia. Patient scheduled for stress echo in AM. Diagnostic and treatment plan discussed with patient.  Patient also complaining of headache, will give Tylenol.    Pt with pulmonary nodule noted on CXR which prompted CT angio chest.  CT chest noted: Solitary approximate 1.7 cm ground-glass opacity in the right lung apex. While this is most likely inflammatory, an early adenocarcinoma might have a similar appearance. Follow-up CT chest in 3 months is suggested to determine if this GGO persists.  Pt will need to F/U with her PCP about this.      Jillian Client Amarianna Abplanalp, PA-C 03/10/12 2338

## 2012-03-10 NOTE — ED Notes (Addendum)
Per pt she has been felling weak since yesterday long with nausea, diarrhea, SOB, and HA. sts her BP has also been increased. Denies chest pain , cough, fever.

## 2012-03-10 NOTE — ED Provider Notes (Signed)
History     CSN: [id number]  Arrival date & time 03/10/12  1237   First MD Initiated Contact with Patient 03/10/12 1711      Chief Complaint  Patient presents with  . Weakness  . Shortness of Breath  . Nausea    (Consider location/radiation/quality/duration/timing/severity/associated sxs/prior treatment) HPI  [name] is a 66 y.o. female complaining of generalized weakness, SOB, chest "discomfort" , nausea, palpitations today with a sensation of her heart was beating slowly with onset this AM. Pt denies fever, chest pain, abdominal pain, and cough.  Pt endorses diarrehea and urinary frequency. Patient has never had a cardiac cath, say she had a stress test that was many years ago.  Risk factors: Diabetic, hypertensive, hyperlipidemia, family history PCP: [facility] outpt clinic Cards: None   Past Medical History  Diagnosis Date  . Allergy   . Hypertension   . Hyperlipidemia   . Fatty liver disease, nonalcoholic   . History of hyperkalemia   . Obesity   . Renal lesion   . Hx antineoplastic chemo  10/12 - 06/24/11    PACLITAX EL COMPLETED 06/24/11  . Breast CA     (Rt) breast ca dx 4/12---  S/P RADICAL MASTECTOMY AND CHEMORADIATION  . Anemia   . Diabetes mellitus ORAL MED  . OA (osteoarthritis) of knee     RIGHT LEG  . Mild nonproliferative diabetic retinopathy of right eye 03/19/11    Dr. Norva Pavlov    Past Surgical History  Procedure Date  . Knee arthroscopy 10-28-1999    RIGHT  . Mastectomy modified radical 12-04-2010    W/ LEFT PAC PLACEMENT (RIGHT BREAST W/ AXILLARY CONTENTS/ NODE BX'S  . Transthoracic echocardiogram 10-29-2010    NORMAL LVF/ EF 55-60%/ MILD MV REGURG  . Vaginal hysterectomy AGE 76    W/ LSO  . Tubal ligation YRS AGO  . Laparoscopy with laparoscopic right salpingo oophorectomy and lysis of pelvic and abdominal adhesions March 2013    Dr. Jennette Kettle   . Portacath placement     REPLACED [facility] DUE TO MALFUNCTION (LEFT)  . Port-a-cath removal  09/24/2011    Procedure: REMOVAL PORT-A-CATH;  Surgeon: Clovis Pu. Cornett, MD;  Location: [facility];  Service: General;  Laterality: N/A;    Family History  Problem Relation Age of Onset  . Stroke Father   . Heart disease Sister   . Cancer Sister     breast  . Heart disease Brother   . Cancer Sister     COLON CANCER    History  Substance Use Topics  . Smoking status: Never Smoker   . Smokeless tobacco: Never Used  . Alcohol Use: No    OB History    Grav Para Term Preterm Abortions TAB SAB Ect Mult Living   4 4 4       4       Review of Systems  Constitutional: Negative for fever.  Respiratory: Positive for shortness of breath.   Cardiovascular: Negative for chest pain.  Gastrointestinal: Negative for nausea, vomiting, abdominal pain and diarrhea.  Neurological: Positive for weakness.  All other systems reviewed and are negative.    Allergies  Metformin  Home Medications   Current Outpatient Rx  Name  Route  Sig  Dispense  Refill  . ANASTROZOLE 1 MG PO TABS   Oral   Take 1 mg by mouth at bedtime.         . ASPIRIN 81 MG PO TABS   Oral  Take 81 mg by mouth daily.          Marland Kitchen DIPHENHYDRAMINE HCL 25 MG PO CAPS   Oral   Take 1 capsule (25 mg total) by mouth every 4 (four) hours as needed for itching.   24 capsule   2   . GLIPIZIDE 10 MG PO TABS      TAKE ONE TABLET BY MOUTH TWICE DAILY BEFORE MEALS   90 tablet   2   . HYDROCHLOROTHIAZIDE 25 MG PO TABS      TAKE ONE TABLET BY MOUTH EVERY DAY   90 tablet   3   . LISINOPRIL 40 MG PO TABS   Oral   Take 1 tablet (40 mg total) by mouth daily.   90 tablet   5   . METFORMIN HCL 500 MG PO TABS      TAKE ONE TABLET BY MOUTH TWICE DAILY WITH A  MEAL   60 tablet   2   . REFRESH OP   Both Eyes   Place 1 drop into both eyes daily at 12 noon.          Marland Kitchen PRAVASTATIN SODIUM 40 MG PO TABS   Oral   Take 40 mg by mouth at bedtime.           BP 160/80  Pulse 85  Temp 98.1 F (36.7 C) (Oral)   Resp 20  SpO2 98%  Physical Exam  Nursing note and vitals reviewed. Constitutional: [name], place, and time. She appears well-developed and well-nourished. No distress.  HENT:  Head: Normocephalic.  Mouth/Throat: Oropharynx is clear and moist.  Eyes: Conjunctivae normal and EOM are normal. Pupils are equal, round, and reactive to light.  Neck: Normal range of motion.  Cardiovascular: Normal rate, regular rhythm, normal heart sounds and intact distal pulses.   Pulmonary/Chest: Effort normal and breath sounds normal. No stridor. No respiratory distress. She has no wheezes. She has no rales. She exhibits no tenderness.  Abdominal: Soft. Bowel sounds are normal. She exhibits no distension and no mass. There is tenderness. There is no rebound and no guarding.       Mild suprapubic tenderness to palpation  Musculoskeletal: Normal range of motion. She exhibits no edema.  Neurological: She is alert and oriented to person, place, and time.  Psychiatric: She has a normal mood and affect.    ED Course  Procedures (including critical care time)  Labs Reviewed  CBC WITH DIFFERENTIAL - Abnormal; Notable for the following:    WBC 3.9 (*)     RDW 16.1 (*)     All other components within normal limits  COMPREHENSIVE METABOLIC PANEL - Abnormal; Notable for the following:    Glucose, Bld 108 (*)     All other components within normal limits  URINALYSIS, ROUTINE W REFLEX MICROSCOPIC - Abnormal; Notable for the following:    APPearance CLOUDY (*)     Leukocytes, UA MODERATE (*)     All other components within normal limits  URINE MICROSCOPIC-ADD ON - Abnormal; Notable for the following:    Squamous Epithelial / LPF FEW (*)     All other components within normal limits  POCT I-STAT TROPONIN I  POCT I-STAT TROPONIN I  URINE CULTURE   Dg Chest 2 View  03/10/2012  *RADIOLOGY REPORT*  Clinical Data: Short of breath  CHEST - 2 VIEW  Comparison: Chest radiograph 03/13/2011, CT  10/11/2010  Findings: Normal cardiac silhouette with ectatic aorta.  No effusion, infiltrate, or pneumothorax.  There is a nodular density at the left lung base is not seen on comparison radiographs or CTs. Axillary clips on the right are noted. Anterior spurring spine noted.  IMPRESSION:  1.  New nodular density at the left lung base.  In patient with cancer history, recommend CT thorax with contrast.  This could be performed on a non emergent basis. 2.  No acute cardiopulmonary findings.   Original Report Authenticated By: Genevive Bi, M.D.    Ct Angio Chest Pe W/cm &/or Wo Cm  03/10/2012  *RADIOLOGY REPORT*  Clinical Data: Chest discomfort.  Shortness of breath. Hypertension.  History of right breast cancer.  CT ANGIOGRAPHY CHEST  Technique:  Multidetector CT imaging of the chest using the standard protocol during bolus administration of intravenous contrast. Multiplanar reconstructed images including MIPs were obtained and reviewed to evaluate the vascular anatomy.  Contrast: OMNIPAQUE IOHEXOL 350 MG/ML SOLN  Comparison: CT chest 10/09/2010 and PET CT of that same date.  Findings: Contrast opacification of the pulmonary arteries is good. Respiratory motion blurred many of the images.  Overall, the study is of moderate to good diagnostic quality.  No filling defects within either main pulmonary artery or their branches in either lung to suggest pulmonary embolism.  Heart size upper normal with borderline to mild left ventricular hypertrophy. No visible coronary calcification.  Minimal aortic annular calcification.  Minimal atherosclerosis involving the thoracic and upper abdominal aorta.  Peripheral ground-glass opacity in the anterior right upper lobe measuring approximately 1.7 cm.  No ground-glass opacities elsewhere in either lung.  Focal consolidation medially in the right lower lobe.  No pulmonary parenchymal nodules or masses.  No pleural effusions.  Central airways patent without significant  bronchial wall thickening.  Prior right mastectomy and axillary node dissection.  No evidence of recurrent tumor in the right chest wall.  No significant mediastinal, hilar, or axillary lymphadenopathy.  Visualized thyroid gland unremarkable.  Visualized upper abdomen unremarkable.  Bone window images demonstrate degenerative changes and dish throughout the thoracic spine, but no evidence of osseous metastatic disease.  IMPRESSION:  1.  No evidence of central pulmonary embolism. 2.  Solitary approximate 1.7 cm ground-glass opacity in the right lung apex.  While this is most likely inflammatory, an early adenocarcinoma might have a similar appearance.  Follow-up CT chest in 3 months is suggested to determine if this GGO persists.  If so, then annual surveillance CT for the next 3 years would be recommended.  These recommendations are taken from "Recommendations for the Management of Subsolid Pulmonary Nodules Detected at CT:  A Statement from the Fleischner Society" Radiology 2013; 266:1, 304- 317.  3.  Atelectasis favored over pneumonia in the medial right lower lobe.  No acute cardiopulmonary disease otherwise. 4.  Right mastectomy without evidence of recurrent or metastatic breast cancer.   Original Report Authenticated By: Hulan Saas, M.D.     Date: 03/10/2012  Rate: 87  Rhythm: normal sinus rhythm  QRS Axis: left  Intervals: normal  ST/T Wave abnormalities: normal  Conduction Disutrbances:none  Narrative Interpretation:   Old EKG Reviewed: unchanged  1. UTI (lower urinary tract infection)   2. Pulmonary nodule       MDM  Of concern for cardiac origin of shortness of breath, this may be an anginal equivalent in postmenopausal diabetic females.  Blood work is unremarkable urinalysis shows a UTI. I will give her a gram of Rocephin via IV.  Troponin is negative and EKG  is nonischemic. I will put the orders and for her to move to CDU on the chest pain protocol. Although patient is eligible  for coronary CT the CAT scan machine is not operational so I will order a stress echo. Sign out given to PA Muthersbaugh.   His chest x-ray shows a new nodular density at the left lung base. Discussed this with attending Dr. Anitra Lauth who feels a CTA to rule out PE is appropriate. CTA shows no PE a do note a solitary 1.7 cm groundglass opacity in the right apex they recommend a followup CT in 3 months.   Results discussed with PA Muthersbaugh, patient will be transferred to the CDU for the stress echo in the a.m. Plan is for discharge of stress echo is unremarkable patient will be made aware of the irregularities on her CAT scan and the importance of followup at 3 months. She'll also be discharged with prescription for antibiotic for UTI.       Wynetta Emery, PA-C 03/10/12 2303

## 2012-03-10 NOTE — Telephone Encounter (Signed)
Pt calls and states she is "feeling faint", weak and thinks her speech is "slurry", for appr 2 weeks she has had h/a's, gait has been"wobbly" and feels weak much of the time, this am she is more so weak, wobbly and faint. She is calling for an appt but is asked to call 911 or have her daughter bring her to the ED now, she is ask not to attempt to drive. She is agreeable

## 2012-03-10 NOTE — ED Provider Notes (Signed)
Medical screening examination/treatment/procedure(s) were conducted as a shared visit with non-physician practitioner(s) and myself.  I personally evaluated the patient during the encounter Patient with atypical chest pain, weakness and shortness of breath that started today after she woke up and took her medications. She states she's not been feeling herself for the last few weeks but denies any medication changes. She denies any vomiting, abdominal pain or diarrhea. Physical exam: Gen: NAD Resp: CTAB CV: RRR Abd: nontender, no gaurding Ext: normal pulse and no edema.  Plan a patient with no signs of acute coronary event. EKG within normal limits troponin negative. Chest x-ray with small area of abnormality. The patient's cancer history and chest pain today we'll rule out a PE with CTA. CT is negative for PE but did show a nodule which she will followup for her. Patient was placed in the chest pain drops protocol. She will get a stress echo in the morning. On inspection of the patient she denies any further symptoms and states she feels well currently. No prior heart history and states last stress test was 5-10 years ago and was normal at that time.   Gwyneth Sprout, MD 03/10/12 2355

## 2012-03-10 NOTE — ED Notes (Signed)
IV team paged.  

## 2012-03-10 NOTE — ED Notes (Signed)
Pt denies CP currently.  

## 2012-03-10 NOTE — Telephone Encounter (Signed)
Agree with 911 and ER eval to R/O CVA. Due to length of sxs would not be TPA candidate.

## 2012-03-10 NOTE — ED Provider Notes (Signed)
Medical screening examination/treatment/procedure(s) were conducted as a shared visit with non-physician practitioner(s) and myself.  I personally evaluated the patient during the encounter   Gwyneth Sprout, MD 03/10/12 2356

## 2012-03-11 DIAGNOSIS — R079 Chest pain, unspecified: Secondary | ICD-10-CM | POA: Diagnosis present

## 2012-03-11 LAB — URINE CULTURE: Colony Count: 60000

## 2012-03-11 LAB — POCT I-STAT TROPONIN I: Troponin i, poc: 0.01 ng/mL (ref 0.00–0.08)

## 2012-03-11 MED ORDER — CEPHALEXIN 500 MG PO CAPS: 500.0000 mg | ORAL_CAPSULE | Freq: Two times a day (BID) | ORAL | Status: DC

## 2012-03-11 MED ORDER — METOPROLOL SUCCINATE ER 25 MG PO TB24: 25.0000 mg | ORAL_TABLET | Freq: Every day | ORAL | Status: DC

## 2012-03-11 NOTE — ED Provider Notes (Signed)
Medical screening examination/treatment/procedure(s) were performed by non-physician practitioner and as supervising physician I was immediately available for consultation/collaboration.  Remmington Teters, MD 03/11/12 2258 

## 2012-03-11 NOTE — Progress Notes (Signed)
Utilization review completed.  

## 2012-03-11 NOTE — H&P (Addendum)
[name] is an 66 y.o. female.   Cardiologist:  NEW  Dr. Tresa Endo PCP:  Chief Complaint: chest discomfort, weakness nausea. HPI: 66 YO,MAAF presented to ER yesterday after waking and not feeling well.  Chest discomfort, dull discomfort. Though initially she was weak and just not feeling well.  She also had nausea.  Additionally she had racing heart rate, at times it felt slow then racing and she felt very bad with the racing heart rate.  She then developed dull chest discomfort.  She also had some diaphoresis.  She attempted to walk to the mail box but stumbled due to weakness.  Hx of a stress test many years ago, no cardiologist.  Unknown cholesterol-per pt but she is on pravastatin. + DM,HTN, family Hx. CAD. She has had rt mastectomy with chemo and radiation.     She was scheduled for a stress echo but cannot walk on the treadmill due to arthritis and now the need for total knee replacements.   Past Medical History  Diagnosis Date  . Allergy   . Hypertension   . Hyperlipidemia   . Fatty liver disease, nonalcoholic   . History of hyperkalemia   . Obesity   . Renal lesion   . Hx antineoplastic chemo  10/12 - 06/24/11    PACLITAX EL COMPLETED 06/24/11  . Breast CA     (Rt) breast ca dx 4/12---  S/P RADICAL MASTECTOMY AND CHEMORADIATION  . Anemia   . Diabetes mellitus ORAL MED  . OA (osteoarthritis) of knee     RIGHT LEG  . Mild nonproliferative diabetic retinopathy of right eye 03/19/11    Dr. Norva Pavlov    Past Surgical History  Procedure Date  . Knee arthroscopy 10-28-1999    RIGHT  . Mastectomy modified radical 12-04-2010    W/ LEFT PAC PLACEMENT (RIGHT BREAST W/ AXILLARY CONTENTS/ NODE BX'S  . Transthoracic echocardiogram 10-29-2010    NORMAL LVF/ EF 55-60%/ MILD MV REGURG  . Vaginal hysterectomy AGE 67    W/ LSO  . Tubal ligation YRS AGO  . Laparoscopy with laparoscopic right salpingo oophorectomy and lysis of pelvic and abdominal adhesions March 2013    Dr.  Jennette Kettle   . Portacath placement     REPLACED [facility] DUE TO MALFUNCTION (LEFT)  . Port-a-cath removal 09/24/2011    Procedure: REMOVAL PORT-A-CATH;  Surgeon: Clovis Pu. Cornett, MD;  Location: [facility];  Service: General;  Laterality: N/A;    Family History  Problem Relation Age of Onset  . Stroke Father   . Heart disease Sister   . Cancer Sister     breast  . Heart disease Brother   . Cancer Sister     COLON CANCER   Social History:  reports that she has never smoked. She has never used smokeless tobacco. She reports that she does not drink alcohol or use illicit drugs.Married, 4 children, 3 GC had been working until her knees caused too much pain.  Allergies:  Allergies  Allergen Reactions  . Metformin Other (See Comments)    REACTION, diarrhea: 850 mg - diarrhea but tolerates 500 mg daily   OUTPATIENT MEDS:   Arimidex 1 mg at HS ASA 81mg   Daily Benadryl prn Glucotrol 10 mg daily Hydrodiuril 25 mg daily lisinopril 40 mg daily Glucophage 500 mg BID Refresh op 1 drop both eyes daily at noon Pravastatin 40 mg at HS   Results for orders placed during the hospital encounter of 03/10/12 (from the past 48  hour(s))  CBC WITH DIFFERENTIAL     Status: Abnormal   Collection Time   03/10/12 12:44 PM      Component Value Range Comment   WBC 3.9 (*) 4.0 - 10.5 K/uL    RBC 3.97  3.87 - 5.11 MIL/uL    Hemoglobin 12.3  12.0 - 15.0 g/dL    HCT 16.1  09.6 - 04.5 %    MCV 91.9  78.0 - 100.0 fL    MCH 31.0  26.0 - 34.0 pg    MCHC 33.7  30.0 - 36.0 g/dL    RDW 40.9 (*) 81.1 - 15.5 %    Platelets 244  150 - 400 K/uL    Neutrophils Relative 60  43 - 77 %    Neutro Abs 2.4  1.7 - 7.7 K/uL    Lymphocytes Relative 33  12 - 46 %    Lymphs Abs 1.3  0.7 - 4.0 K/uL    Monocytes Relative 5  3 - 12 %    Monocytes Absolute 0.2  0.1 - 1.0 K/uL    Eosinophils Relative 1  0 - 5 %    Eosinophils Absolute 0.1  0.0 - 0.7 K/uL    Basophils Relative 1  0 - 1 %    Basophils Absolute 0.0  0.0 - 0.1 K/uL     COMPREHENSIVE METABOLIC PANEL     Status: Abnormal   Collection Time   03/10/12 12:44 PM      Component Value Range Comment   Sodium 140  135 - 145 mEq/L    Potassium 3.5  3.5 - 5.1 mEq/L    Chloride 97  96 - 112 mEq/L    CO2 31  19 - 32 mEq/L    Glucose, Bld 108 (*) 70 - 99 mg/dL    BUN 14  6 - 23 mg/dL    Creatinine, Ser 9.14  0.50 - 1.10 mg/dL    Calcium 78.2  8.4 - 10.5 mg/dL    Total Protein 7.5  6.0 - 8.3 g/dL    Albumin 4.7  3.5 - 5.2 g/dL    AST 16  0 - 37 U/L    ALT 21  0 - 35 U/L    Alkaline Phosphatase 63  39 - 117 U/L    Total Bilirubin 0.4  0.3 - 1.2 mg/dL    GFR calc non Af Amer >90  >90 mL/min    GFR calc Af Amer >90  >90 mL/min   URINALYSIS, ROUTINE W REFLEX MICROSCOPIC     Status: Abnormal   Collection Time   03/10/12  4:00 PM      Component Value Range Comment   Color, Urine YELLOW  YELLOW    APPearance CLOUDY (*) CLEAR    Specific Gravity, Urine 1.025  1.005 - 1.030    pH 6.0  5.0 - 8.0    Glucose, UA NEGATIVE  NEGATIVE mg/dL    Hgb urine dipstick NEGATIVE  NEGATIVE    Bilirubin Urine NEGATIVE  NEGATIVE    Ketones, ur NEGATIVE  NEGATIVE mg/dL    Protein, ur NEGATIVE  NEGATIVE mg/dL    Urobilinogen, UA 1.0  0.0 - 1.0 mg/dL    Nitrite NEGATIVE  NEGATIVE    Leukocytes, UA MODERATE (*) NEGATIVE   URINE MICROSCOPIC-ADD ON     Status: Abnormal   Collection Time   03/10/12  4:00 PM      Component Value Range Comment   Squamous Epithelial / LPF FEW (*)  RARE    WBC, UA 7-10  <3 WBC/hpf    RBC / HPF 0-2  <3 RBC/hpf    Bacteria, UA RARE  RARE   POCT I-STAT TROPONIN I     Status: Normal   Collection Time   03/10/12  6:03 PM      Component Value Range Comment   Troponin i, poc 0.01  0.00 - 0.08 ng/mL    Comment 3            POCT I-STAT TROPONIN I     Status: Normal   Collection Time   03/10/12  9:56 PM      Component Value Range Comment   Troponin i, poc 0.00  0.00 - 0.08 ng/mL    Comment 3            POCT I-STAT TROPONIN I     Status: Normal   Collection  Time   03/10/12 11:59 PM      Component Value Range Comment   Troponin i, poc 0.01  0.00 - 0.08 ng/mL    Comment 3             Dg Chest 2 View  03/10/2012  *RADIOLOGY REPORT*  Clinical Data: Short of breath  CHEST - 2 VIEW  Comparison: Chest radiograph 03/13/2011, CT 10/11/2010  Findings: Normal cardiac silhouette with ectatic aorta.  No effusion, infiltrate, or pneumothorax.  There is a nodular density at the left lung base is not seen on comparison radiographs or CTs. Axillary clips on the right are noted. Anterior spurring spine noted.  IMPRESSION:  1.  New nodular density at the left lung base.  In patient with cancer history, recommend CT thorax with contrast.  This could be performed on a non emergent basis. 2.  No acute cardiopulmonary findings.   Original Report Authenticated By: Genevive Bi, M.D.    Ct Angio Chest Pe W/cm &/or Wo Cm  03/10/2012  *RADIOLOGY REPORT*  Clinical Data: Chest discomfort.  Shortness of breath. Hypertension.  History of right breast cancer.  CT ANGIOGRAPHY CHEST  Technique:  Multidetector CT imaging of the chest using the standard protocol during bolus administration of intravenous contrast. Multiplanar reconstructed images including MIPs were obtained and reviewed to evaluate the vascular anatomy.  Contrast: OMNIPAQUE IOHEXOL 350 MG/ML SOLN  Comparison: CT chest 10/09/2010 and PET CT of that same date.  Findings: Contrast opacification of the pulmonary arteries is good. Respiratory motion blurred many of the images.  Overall, the study is of moderate to good diagnostic quality.  No filling defects within either main pulmonary artery or their branches in either lung to suggest pulmonary embolism.  Heart size upper normal with borderline to mild left ventricular hypertrophy. No visible coronary calcification.  Minimal aortic annular calcification.  Minimal atherosclerosis involving the thoracic and upper abdominal aorta.  Peripheral ground-glass opacity in the  anterior right upper lobe measuring approximately 1.7 cm.  No ground-glass opacities elsewhere in either lung.  Focal consolidation medially in the right lower lobe.  No pulmonary parenchymal nodules or masses.  No pleural effusions.  Central airways patent without significant bronchial wall thickening.  Prior right mastectomy and axillary node dissection.  No evidence of recurrent tumor in the right chest wall.  No significant mediastinal, hilar, or axillary lymphadenopathy.  Visualized thyroid gland unremarkable.  Visualized upper abdomen unremarkable.  Bone window images demonstrate degenerative changes and dish throughout the thoracic spine, but no evidence of osseous metastatic disease.  IMPRESSION:  1.  No evidence of central pulmonary embolism. 2.  Solitary approximate 1.7 cm ground-glass opacity in the right lung apex.  While this is most likely inflammatory, an early adenocarcinoma might have a similar appearance.  Follow-up CT chest in 3 months is suggested to determine if this GGO persists.  If so, then annual surveillance CT for the next 3 years would be recommended.  These recommendations are taken from "Recommendations for the Management of Subsolid Pulmonary Nodules Detected at CT:  A Statement from the Fleischner Society" Radiology 2013; 266:1, 304- 317.  3.  Atelectasis favored over pneumonia in the medial right lower lobe.  No acute cardiopulmonary disease otherwise. 4.  Right mastectomy without evidence of recurrent or metastatic breast cancer.   Original Report Authenticated By: Hulan Saas, M.D.     ROS: General:+ sinus congestion, no fevers, now wt changes Skin:no rashes or ulcers HEENT:no blurred vision CV:see HPI PUL:see HPI GI:+ nausea on arrival, mild diarrhea, no melena GU:no hematuria MS:+ BIL knee pain with arthritis Neuro:no syncope but lightheaded during the episode yesterday Endo:+ DM, no thyroid disease   Blood pressure 137/59, pulse 90, temperature 97.9 F (36.6  C), temperature source Oral, resp. rate 17, height 4\' 11"  (1.499 m), weight 70.96 kg (156 lb 7 oz), SpO2 100.00%. PE: General:alert and oriented, pleasant affect NAD Skin:Warm and dry, brisk capillary refill  HEENT:normocephalic, sclera clear Neck:supple, no JVD, no bruits  Heart:S1S2 RRR, no obvious murmur gallup or rub Lungs:clear without rales, rhonchi or wheezes Abd:+ BS soft, non tender Ext:no edema, 1+ post tib bil Neuro:alert and oriented X 3 MAE, follows commands    Assessment/Plan Principal Problem:  *Chest pain at rest, assoc. with nausea, racing heart,  Active Problems:  HYPERLIPIDEMIA  Hypertension associated with diabetes  OSTEOARTHRITIS  PLAN: Unsure if chest pain/discomfort from tachycardia vs. Tachycardia from ischemia. She has multiple risks for CAD.  See Dr. Landry Dyke note for recommendations.  Her lung nodule will need eval by pulmonary.    INGOLD,LAURA R 03/11/2012, 11:27 AM   Patient seen and examined. Agree with assessment and plan. Patient is currently pain free. Cardiac enzymes are negative; ECG without ischemic changes except nondiagnostic T wave in III.  CT results reviewed; 1.7 cm anterior RUL opacity for which f/u CT recommended.  Will DC today and arrange outpatient lexiscan myoview and echo with f/u office visit. Empirically add low dose cardioselective beta blocker.   Lennette Bihari, MD, [facility] 03/11/2012 12:26 PM

## 2012-03-11 NOTE — ED Notes (Signed)
Report to greg rn

## 2012-03-11 NOTE — ED Notes (Signed)
Per Charlynn Court, RN, the patient is receiving a stress test (but NOT a cardiac CT) in the a.m., so no metoprolol is necessary.

## 2012-03-11 NOTE — ED Provider Notes (Signed)
7:32 AM Patient with a hx sig for chest pain was placed in CDU on chest pain protocol by Dr. Theodoro Kalata. Patient care resumed from Dr. Dierdre Highman.  Patient is here for chest pain protocol and has received IV rocephin for UTI. While in obeservation over night the pt slept well and had no complaints, per nursing staff. Patient re-evaluated and is resting comfortable, VSS, with no new complaints or concerns at this time. Plan per previous provider is to have a stress test and results will determine disposition. On exam: hemodynamically stable, NAD, heart w/ RRR, lungs CTAB, Chest & abd non-tender, no peripheral edema or calf tenderness. Pain in knees.   Patient reports she is unable to walk on the treadmill due to arthritic knee pain.    9:13 AM Cardiology will see the patient for other arrangements.   12:48 PM Cardiology saw the patient who has agreed to send the patient home with outpatient follow up. No further evaluation.   Jillian Beck, PA-C 03/11/12 1249

## 2012-03-11 NOTE — ED Notes (Signed)
In to do pt morning assessment to find pt eating breakfast. Pt is supposed to be npo for her stress test this morning. After assessing pt pt states she has a treadmill at home but is unable to use it due to chronic knee pain and needing knee replacements. Pa made aware and will do cardiology consult

## 2012-03-11 NOTE — ED Notes (Signed)
Cardiologist at bedside.  

## 2012-03-12 ENCOUNTER — Ambulatory Visit (INDEPENDENT_AMBULATORY_CARE_PROVIDER_SITE_OTHER): Payer: Self-pay | Admitting: Internal Medicine

## 2012-03-12 DIAGNOSIS — E119 Type 2 diabetes mellitus without complications: Secondary | ICD-10-CM

## 2012-03-12 DIAGNOSIS — Z79899 Other long term (current) drug therapy: Secondary | ICD-10-CM

## 2012-03-12 DIAGNOSIS — R519 Headache, unspecified: Secondary | ICD-10-CM

## 2012-03-12 DIAGNOSIS — R51 Headache: Secondary | ICD-10-CM

## 2012-03-12 LAB — GLUCOSE, CAPILLARY: Glucose-Capillary: 104 mg/dL — ABNORMAL HIGH (ref 70–99)

## 2012-03-12 LAB — POCT GLYCOSYLATED HEMOGLOBIN (HGB A1C): Hemoglobin A1C: 6.8

## 2012-03-12 NOTE — Patient Instructions (Addendum)
  [name],  We believe that your symptoms are most likely not a cause for concern. To treat them, we suggest Acetaminophen, 650mg  to be taken four times per day as needed for pain, and Ibuprofen 200 mg three times a day as needed for pain with food.  Due to your age, new headaches, and history of cancer, we will need an MRI. We'll schedule this for next week so that you have time to get the [employer].   For your allergies, please take your fluticasone propionate, one spray per nostril per day, and Afrin, available over the counter, 2-3 times per day as needed for 3 days. We also recommend that you perform steam inhalations 2-3 times per day.   If your symptoms get worse, please contact us. We would like to schedule a follow up appointment for you in a couple of weeks.

## 2012-03-12 NOTE — Progress Notes (Signed)
Subjective [name] is a 66 year old lady with a history of HLP, HTN, breast cancer, and DM type II that presents to the outpatient clinic with a two week history of headache.  Her headaches manifest primarily as a pressure behind her eyes, reaching the top of her head bilaterally. She has pain in her face and ears, hurting down to the neck. The headaches are intermittent, lasting for a few minutes at a time, and occuring several times per day. She rates them a 2 or 3 out of ten, and considers them more of an annoyance than anything debilitating. She reports feeling a little wobbly this past Wednesday, like she was going to faint. She felt as though her heart would alternate between racing and beating too slow.    She has had no fevers, but chills a few weeks ago. She recently had her allergies flare up. When she has allergies, she typically gets a runny nose and itchy eyes, but no headaches or pressure. Occasionally, she blows her nose and produces blood. Her eyes water and nose runs about equally on both sides. She has never lost vision in either eye, but reports a bit of blurry vision at times. She denies vomiting, but has experienced nausea, that has resulted in decreased appetite lately. She denies a pounding headache, unilateral symptoms, headache lasting 24 hours duration, or disabling symptoms. Her headache is not position dependent.   She is s/p right mastectomy for breast cancer. She finished radiation therapy in April of this year, and continues to take anastrozole.   Objective VITALS: Significant for BP 148/88 GEN: NAD, resting comfortably HEENT: PERRL, EOMI, anicteric, nl OP, moist MM, no papilledema NECK: supple, no appreciable cervical or supraclavicular LAD CV: RRR, normal S1S2, 2/6 systolic murmur, no carotid bruits LUNGS: CTAB no wheezes or crackles ABD: soft, NT/ND +bs EXT: no edema/cyanosis/clubbing PSYCH: AAO x 3, normal affect NEURO: CN III-XII grossly intact, 5+  strength bilateral upper extremities, plantarflexion, dorsiflexion, knee extension and flexion, 5- strength bilateral hip flexion and extension , sensation intact throughout SKIN: no rashes or lesions MSK: bilateral knee joint tenderness  Assessment/Plan **Headaches Odds are, Ms Desjardin is having tension headaches. They are bilateral, and meet none of the POUND criteria, with potentially the exception of nausea (which can be attributable to her chemo/cancer). The may be due to the stress of cancer and her recent illnesses. It may also be part of the constellation of symptoms seen in her current allergy flare. She has no Horner's syndrome that would suggest carotid dissection or cluster headaches, and no bruit auscultated over her neck. However, Ms. Bouvier has two red flags: a history of cancer and an age >20. As such, we need an MRI of the head to rule out the possibility of intracranial metastases/malignancy. She has no focal neuro findings, and her headaches are not position dependent, are intermittent, and do not bother her while she is sleeping, all suggesting against intracranial malignancy. She ideally needs to be on the [employer] assistance program prior to having an MRI performed, otherwise it will go right onto her already hefty debt from medical bills. For the treatment of tension HA, we recommended acetaminophen and ibuprofen for symptomatic relief.  **Allergies She reports itchy eyes and runny nose, consistent with her previous allergies. Additionally, she describes pain in the sinus region, and she has a history of allergic rhinitis. She may currently have a flare allergic rhinitis with concomitant sinusitis symptoms. We recommended steam inhalations 2-3 times  per day, resuming daily fluticasone, and 2-3 inhalations of afrin PRN for 3 days.

## 2012-03-15 ENCOUNTER — Encounter: Payer: Self-pay | Admitting: Internal Medicine

## 2012-03-15 NOTE — Progress Notes (Signed)
  Subjective:    Patient ID: [name], female    DOB: [birth year], 66 y.o.   MRN: [id number]  HPI  Please see the A&P for the status of the pt's chronic medical problems.   Review of Systems  Constitutional: Positive for chills. Negative for fever and unexpected weight change.  HENT: Positive for ear pain, postnasal drip and sinus pressure. Negative for rhinorrhea.        Altered taste  Eyes: Positive for pain and discharge. Negative for visual disturbance.  Respiratory: Positive for cough.   Neurological: Positive for weakness and headaches.  Psychiatric/Behavioral: Positive for sleep disturbance.       Objective:   Physical Exam  Constitutional: [name], place, and time. She appears well-developed and well-nourished. No distress.  HENT:  Head: Normocephalic and atraumatic.  Right Ear: External ear normal.  Left Ear: External ear normal.  Nose: Nose normal.  Mouth/Throat: Oropharynx is clear and moist.  Eyes: Conjunctivae normal and EOM are normal. Right eye exhibits no discharge. Left eye exhibits no discharge. No scleral icterus.  Neck: Normal range of motion. Neck supple. No tracheal deviation present.  Cardiovascular: Normal rate, regular rhythm and normal heart sounds.   Pulmonary/Chest: Effort normal and breath sounds normal.  Musculoskeletal: Normal range of motion. She exhibits no edema and no tenderness.  Lymphadenopathy:    She has no cervical adenopathy.  Neurological: She is alert and oriented to person, place, and time.  Skin: Skin is warm and dry. No rash noted. She is not diaphoretic. No erythema. No pallor.  Psychiatric: She has a normal mood and affect. Her behavior is normal. Judgment and thought content normal.          Assessment & Plan:

## 2012-03-16 ENCOUNTER — Other Ambulatory Visit (HOSPITAL_COMMUNITY): Payer: Self-pay | Admitting: Cardiovascular Disease

## 2012-03-16 DIAGNOSIS — R079 Chest pain, unspecified: Secondary | ICD-10-CM

## 2012-03-16 DIAGNOSIS — R519 Headache, unspecified: Secondary | ICD-10-CM | POA: Insufficient documentation

## 2012-03-16 NOTE — Assessment & Plan Note (Signed)
She has never had trouble with HA until about a few weeks ago. They are not positional, pounding, assoc with neurologic abnl, N/V. They are B and last a few min and occur several times a day. It is an "annoying ache". She has also noticed that her ears hurt and goes down into her neck. Her eyes feel funny and "run water". She had some chills a few weeks ago but not now and not fever or neck stiffness. She tried a cough med bc she thought she had a cold and it didn't help but did let her sleep. Other sxs inc food tasting salty, faint, weak, PND, cough, weak, blurry vision.   Her exam was benign.  She has two red flags - new HA after age 66 and recent cancer dx and treatment (breast cancer, s/p mastectomy and XRT). Therefore, I rec an MRI. We discussed the financial aspects since she is uninsured and she wanted to pursue the MRI and will be on payment plan. I encouraged her to reapply for the orange card prior to the MRI as she hasn't tried to get it recently and her income has changed. Therefore, delay the MRI until mid next week.   Otherwise, to use tylenol and IBU. There are some allergic aspects to her sxs so I have encouraged her to use her steroid nose spray QD for some time. She can also use Afrin for a couple of days.   To return to clinic if no better or worse.

## 2012-03-29 ENCOUNTER — Ambulatory Visit: Payer: Self-pay

## 2012-03-30 ENCOUNTER — Encounter (HOSPITAL_COMMUNITY)
Admission: RE | Admit: 2012-03-30 | Discharge: 2012-03-30 | Disposition: A | Discharge status: Home / Self Care | Payer: Self-pay | Source: Ambulatory Visit | Attending: Cardiovascular Disease | Admitting: Cardiovascular Disease

## 2012-03-30 ENCOUNTER — Other Ambulatory Visit: Payer: Self-pay

## 2012-03-30 ENCOUNTER — Ambulatory Visit (HOSPITAL_COMMUNITY)
Admission: RE | Admit: 2012-03-30 | Discharge: 2012-03-30 | Disposition: A | Discharge status: Home / Self Care | Payer: Self-pay | Source: Ambulatory Visit | Attending: Cardiovascular Disease | Admitting: Cardiovascular Disease

## 2012-03-30 DIAGNOSIS — E119 Type 2 diabetes mellitus without complications: Secondary | ICD-10-CM | POA: Insufficient documentation

## 2012-03-30 DIAGNOSIS — R079 Chest pain, unspecified: Secondary | ICD-10-CM | POA: Insufficient documentation

## 2012-03-30 DIAGNOSIS — Z8249 Family history of ischemic heart disease and other diseases of the circulatory system: Secondary | ICD-10-CM | POA: Insufficient documentation

## 2012-03-30 DIAGNOSIS — E785 Hyperlipidemia, unspecified: Secondary | ICD-10-CM | POA: Insufficient documentation

## 2012-03-30 DIAGNOSIS — I10 Essential (primary) hypertension: Secondary | ICD-10-CM | POA: Insufficient documentation

## 2012-03-30 DIAGNOSIS — E669 Obesity, unspecified: Secondary | ICD-10-CM | POA: Insufficient documentation

## 2012-03-30 DIAGNOSIS — R072 Precordial pain: Secondary | ICD-10-CM | POA: Insufficient documentation

## 2012-03-30 DIAGNOSIS — I379 Nonrheumatic pulmonary valve disorder, unspecified: Secondary | ICD-10-CM | POA: Insufficient documentation

## 2012-03-30 MED ORDER — REGADENOSON 0.4 MG/5ML IV SOLN
0.4000 mg | Freq: Once | INTRAVENOUS | Status: AC
  Administered 2012-03-30: 0.4 mg via INTRAVENOUS

## 2012-03-30 MED ORDER — TECHNETIUM TC 99M SESTAMIBI GENERIC - CARDIOLITE
10.0000 | Freq: Once | INTRAVENOUS | Status: AC | PRN
  Administered 2012-03-30: 10 via INTRAVENOUS

## 2012-03-30 MED ORDER — TECHNETIUM TC 99M SESTAMIBI GENERIC - CARDIOLITE
30.0000 | Freq: Once | INTRAVENOUS | Status: AC | PRN
  Administered 2012-03-30: 30 via INTRAVENOUS

## 2012-03-30 MED ORDER — SODIUM CHLORIDE 0.9 % IJ SOLN
80.0000 mg | Freq: Once | INTRAMUSCULAR | Status: DC
  Filled 2012-03-30: qty 3.2

## 2012-03-30 MED ORDER — REGADENOSON 0.4 MG/5ML IV SOLN
INTRAVENOUS | Status: AC
  Filled 2012-03-30: qty 5

## 2012-03-30 MED ORDER — SODIUM CHLORIDE 0.9 % IJ SOLN
80.0000 mg | Freq: Once | INTRAVENOUS | Status: AC
  Administered 2012-03-30: 80 mg via INTRAVENOUS
  Filled 2012-03-30: qty 3.2

## 2012-03-30 NOTE — Progress Notes (Signed)
  Echocardiogram 2D Echocardiogram has been performed.  Cathie Beams 03/30/2012, 1:34 PM

## 2012-05-27 ENCOUNTER — Other Ambulatory Visit (HOSPITAL_BASED_OUTPATIENT_CLINIC_OR_DEPARTMENT_OTHER): Payer: Self-pay | Admitting: Lab

## 2012-05-27 DIAGNOSIS — C50319 Malignant neoplasm of lower-inner quadrant of unspecified female breast: Secondary | ICD-10-CM

## 2012-05-27 DIAGNOSIS — E559 Vitamin D deficiency, unspecified: Secondary | ICD-10-CM

## 2012-05-27 DIAGNOSIS — C773 Secondary and unspecified malignant neoplasm of axilla and upper limb lymph nodes: Secondary | ICD-10-CM

## 2012-05-27 DIAGNOSIS — C50919 Malignant neoplasm of unspecified site of unspecified female breast: Secondary | ICD-10-CM

## 2012-05-27 LAB — CBC WITH DIFFERENTIAL/PLATELET
BASO%: 0.6 % (ref 0.0–2.0)
Basophils Absolute: 0 10*3/uL (ref 0.0–0.1)
EOS%: 2.4 % (ref 0.0–7.0)
Eosinophils Absolute: 0.1 10*3/uL (ref 0.0–0.5)
HCT: 35.4 % (ref 34.8–46.6)
HGB: 11.9 g/dL (ref 11.6–15.9)
LYMPH%: 27 % (ref 14.0–49.7)
MCH: 31.3 pg (ref 25.1–34.0)
MCHC: 33.5 g/dL (ref 31.5–36.0)
MCV: 93.6 fL (ref 79.5–101.0)
MONO#: 0.3 10*3/uL (ref 0.1–0.9)
MONO%: 6.9 % (ref 0.0–14.0)
NEUT#: 2.7 10*3/uL (ref 1.5–6.5)
NEUT%: 63.1 % (ref 38.4–76.8)
Platelets: 185 10*3/uL (ref 145–400)
RBC: 3.78 10*6/uL (ref 3.70–5.45)
RDW: 16.9 % — ABNORMAL HIGH (ref 11.2–14.5)
WBC: 4.3 10*3/uL (ref 3.9–10.3)
lymph#: 1.2 10*3/uL (ref 0.9–3.3)

## 2012-05-27 LAB — COMPREHENSIVE METABOLIC PANEL (CC13)
ALT: 20 U/L (ref 0–55)
AST: 15 U/L (ref 5–34)
Albumin: 4.2 g/dL (ref 3.5–5.0)
Alkaline Phosphatase: 63 U/L (ref 40–150)
BUN: 15 mg/dL (ref 7.0–26.0)
CO2: 30 mEq/L — ABNORMAL HIGH (ref 22–29)
Calcium: 10.1 mg/dL (ref 8.4–10.4)
Chloride: 100 mEq/L (ref 98–107)
Creatinine: 0.8 mg/dL (ref 0.6–1.1)
Glucose: 120 mg/dl — ABNORMAL HIGH (ref 70–99)
Potassium: 3.6 mEq/L (ref 3.5–5.1)
Sodium: 141 mEq/L (ref 136–145)
Total Bilirubin: 0.55 mg/dL (ref 0.20–1.20)
Total Protein: 7.3 g/dL (ref 6.4–8.3)

## 2012-05-28 LAB — CANCER ANTIGEN 27.29: CA 27.29: 12 U/mL (ref 0–39)

## 2012-05-28 LAB — VITAMIN D 25 HYDROXY (VIT D DEFICIENCY, FRACTURES): Vit D, 25-Hydroxy: 40 ng/mL (ref 30–89)

## 2012-06-03 ENCOUNTER — Other Ambulatory Visit: Payer: Self-pay | Admitting: Lab

## 2012-06-03 ENCOUNTER — Ambulatory Visit (HOSPITAL_BASED_OUTPATIENT_CLINIC_OR_DEPARTMENT_OTHER): Payer: Self-pay | Admitting: Physician Assistant

## 2012-06-03 ENCOUNTER — Encounter: Payer: Self-pay | Admitting: Physician Assistant

## 2012-06-03 ENCOUNTER — Telehealth: Payer: Self-pay | Admitting: Oncology

## 2012-06-03 VITALS — BP 169/74 | HR 89 | Temp 98.0°F | Resp 20 | Ht 59.0 in | Wt 164.5 lb

## 2012-06-03 DIAGNOSIS — C773 Secondary and unspecified malignant neoplasm of axilla and upper limb lymph nodes: Secondary | ICD-10-CM

## 2012-06-03 DIAGNOSIS — C50319 Malignant neoplasm of lower-inner quadrant of unspecified female breast: Secondary | ICD-10-CM

## 2012-06-03 DIAGNOSIS — C50919 Malignant neoplasm of unspecified site of unspecified female breast: Secondary | ICD-10-CM

## 2012-06-03 DIAGNOSIS — Z170 Estrogen receptor positive status [ER+]: Secondary | ICD-10-CM

## 2012-06-03 NOTE — Progress Notes (Signed)
ID: [name]   DOB: 14-Nov-1945  MR#: [id number]  JXB#:[id number]  HISTORY OF PRESENT ILLNESS: Ms. Hosein had screening mammography Sep 11, 2010, at [facility]. This showed a possible right axillary mass and she was brought back for additional views Sep 20, 2010. An ultrasound showed abnormal enlarged lymph nodes in the right axilla measuring a maximum of 2.2 cm. Biopsy was suggested and performed on Sep 25, 2010. The pathology from that procedure ([id number]) showed metastatic adenocarcinoma consistent with a breast primary. In particular, the tumor was estrogen and progesterone receptor positive, HER2/neu was negative, the MIB-1 was 40%.  With this information, the patient was referred for bilateral breast MRIs performed May 29. This showed a complex picture, there being in addition to 2 pathologically enlarged lymph nodes in the right axilla and then other mildly prominent lymph nodes, 2 masses in the breast itself, one was subareolar and measured 1.3 cm, the second mass was more posterior and inferior and measured 8 mm. The distance between these 2 masses was 4.5 cm. the second breast mass was biopsied and was found to be benign.  She has a deleterious BRCA-2 mutation, namely 6696 deletion TC.  Status post right mastectomy and axillary lymph node dissection December 04, 2010 for a 1.5 cm invasive ductal carcinoma, grade 3, involving 3/12 axillary lymph nodes, with ample margins, T1c N1 M0, estrogen receptor 94% positive, progesterone receptor 4% positive with an MIB-1 of 62% and no HER2 amplification on the original material. Further therapy as detailed below.  INTERVAL HISTORY:  Agripina returns today for followup of her right breast carcinoma. She continues on anastrozole daily which she is tolerating well. She does have some hot flashes. She tried venlafaxine briefly but did not like how she felt and discontinued the medication. At this point she is "just tolerating" the hot flashes. She has  no vaginal dryness.  Overall Atalie is doing well. She still has some residual neuropathy, primarily affecting her toes. This is gradually improving and she is taking a B complex. She's still has a reduced appetite. She denies any nausea or emesis. She is trying to get back into an exercise routine, and was doing well until it became to call down side to walk.  Interval history is remarkable for a CT scan in early November which showed a 1.7 cm groundglass opacity in the right lung apex which will need followup. Chancey herself tells me she has no problems with cough, phlegm production, pleurisy, or shortness of breath.  REVIEW OF SYSTEMS:  Angella has had no recent illnesses and denies fevers or chills. Her energy level is good. She's had no nausea or change in bowel habits. Her blood sugars are well-controlled she tells me. No chest pain or palpitations. No abnormal headaches or dizziness. She has some knee pain she attributes to arthritis, but has had no increased joint pain, and denies any unusual myalgias or bony pain. No peripheral swelling has been noted.   Otherwise a detailed review of systems is stable and noncontributory.   PAST MEDICAL HISTORY: Past Medical History  Diagnosis Date  . Allergy   . Hypertension   . Hyperlipidemia   . Fatty liver disease, nonalcoholic   . History of hyperkalemia   . Obesity   . Renal lesion   . Hx antineoplastic chemo  10/12 - 06/24/11    PACLITAX EL COMPLETED 06/24/11  . Breast CA     (Rt) breast ca dx 4/12---  S/P RADICAL MASTECTOMY AND CHEMORADIATION  .  Anemia   . Diabetes mellitus ORAL MED  . OA (osteoarthritis) of knee     RIGHT LEG  . Mild nonproliferative diabetic retinopathy of right eye 03/19/11    Dr. Norva Pavlov    PAST SURGICAL HISTORY: Past Surgical History  Procedure Date  . Knee arthroscopy 10-28-1999    RIGHT  . Mastectomy modified radical 12-04-2010    W/ LEFT PAC PLACEMENT (RIGHT BREAST W/ AXILLARY CONTENTS/  NODE BX'S  . Transthoracic echocardiogram 10-29-2010    NORMAL LVF/ EF 55-60%/ MILD MV REGURG  . Vaginal hysterectomy AGE 39    W/ LSO  . Tubal ligation YRS AGO  . Laparoscopy with laparoscopic right salpingo oophorectomy and lysis of pelvic and abdominal adhesions March 2013    Dr. Jennette Kettle   . Portacath placement     REPLACED [facility] DUE TO MALFUNCTION (LEFT)  . Port-a-cath removal 09/24/2011    Procedure: REMOVAL PORT-A-CATH;  Surgeon: Clovis Pu. Cornett, MD;  Location: [facility];  Service: General;  Laterality: N/A;    FAMILY HISTORY Family History  Problem Relation Age of Onset  . Stroke Father   . Heart disease Sister   . Cancer Sister     breast  . Heart disease Brother   . Cancer Sister     COLON CANCER  patient's mother alive age 42; father deceased, cause unknown. The patient has 8 sisters and 9 brothers.   GYNECOLOGIC HISTORY: GX, P4, first pregnancy age 63; hysterectomy age 35 with USO; on premarin >20 years, discontinued at the time of breast cancer diagnosis  SOCIAL HISTORY: retired from office work; husband Derrick works as a Curator; 4 daughters, 3 in [place] {[place], [place] and [place]), 1 in [place] ([place]); 3 grandsons; attends [employer].   ADVANCED DIRECTIVES:  HEALTH MAINTENANCE: History  Substance Use Topics  . Smoking status: Never Smoker   . Smokeless tobacco: Never Used  . Alcohol Use: No     Colonoscopy: Approx 2011  PAP:  UTD   Bone density: Never  Lipid panel: followed by PCP, UTD  Allergies  Allergen Reactions  . Metformin Other (See Comments)    REACTION, diarrhea: 850 mg - diarrhea but tolerates 500 mg daily    Current Outpatient Prescriptions  Medication Sig Dispense Refill  . anastrozole (ARIMIDEX) 1 MG tablet Take 1 mg by mouth at bedtime.      Marland Kitchen aspirin 81 MG tablet Take 81 mg by mouth daily.       . diphenhydrAMINE (BENADRYL) 25 mg capsule Take 1 capsule (25 mg total) by mouth every 4 (four) hours as needed for itching.  24  capsule  2  . glipiZIDE (GLUCOTROL) 10 MG tablet TAKE ONE TABLET BY MOUTH TWICE DAILY BEFORE MEALS  90 tablet  2  . hydrochlorothiazide (HYDRODIURIL) 25 MG tablet TAKE ONE TABLET BY MOUTH EVERY DAY  90 tablet  3  . lisinopril (PRINIVIL,ZESTRIL) 40 MG tablet Take 1 tablet (40 mg total) by mouth daily.  90 tablet  5  . metFORMIN (GLUCOPHAGE) 500 MG tablet TAKE ONE TABLET BY MOUTH TWICE DAILY WITH A  MEAL  60 tablet  2  . metoprolol succinate (TOPROL XL) 25 MG 24 hr tablet Take 1 tablet (25 mg total) by mouth daily.  30 tablet  5  . Polyvinyl Alcohol-Povidone (REFRESH OP) Place 1 drop into both eyes daily at 12 noon.       . pravastatin (PRAVACHOL) 40 MG tablet Take 40 mg by mouth at bedtime.      Marland Kitchen  cephALEXin (KEFLEX) 500 MG capsule Take 1 capsule (500 mg total) by mouth 2 (two) times daily.  20 capsule  0    OBJECTIVE: Middle-aged African American woman in no acute distress  Filed Vitals:   06/03/12 1035  BP: 169/74  Pulse: 89  Temp: 98 F (36.7 C)  Resp: 20     Body mass index is 33.23 kg/(m^2).    ECOG FS:0 Filed Weights   06/03/12 1035  Weight: 164 lb 8 oz (74.617 kg)   Physical Exam: HEENT:  Sclerae anicteric.  Oropharynx clear.   Nodes:  No cervical, supraclavicular, or axillary lymphadenopathy palpated.  Breast Exam:  Patient is status post right mastectomy with well-healed incision. No  evidence of local recurrence. Left breast is unremarkable Lungs:  Clear to auscultation bilaterally.  No crackles, rhonchi, or wheezes.   Heart:  Regular rate and rhythm.   Abdomen:  Soft, nontender.  Positive bowel sounds. Musculoskeletal:  No focal spinal tenderness to palpation.  Extremities:    No peripheral edema or cyanosis.   Neuro:  Nonfocal. Well oriented  LAB RESULTS: Lab Results  Component Value Date   WBC 4.3 05/27/2012   NEUTROABS 2.7 05/27/2012   HGB 11.9 05/27/2012   HCT 35.4 05/27/2012   MCV 93.6 05/27/2012   PLT 185 05/27/2012      Chemistry      Component Value  Date/Time   NA 141 05/27/2012 0947   NA 140 03/10/2012 1244   K 3.6 05/27/2012 0947   K 3.5 03/10/2012 1244   CL 100 05/27/2012 0947   CL 97 03/10/2012 1244   CO2 30* 05/27/2012 0947   CO2 31 03/10/2012 1244   BUN 15.0 05/27/2012 0947   BUN 14 03/10/2012 1244   CREATININE 0.8 05/27/2012 0947   CREATININE 0.64 03/10/2012 1244   GLU 132* 03/25/2011 0957      Component Value Date/Time   CALCIUM 10.1 05/27/2012 0947   CALCIUM 10.1 03/10/2012 1244   ALKPHOS 63 05/27/2012 0947   ALKPHOS 63 03/10/2012 1244   AST 15 05/27/2012 0947   AST 16 03/10/2012 1244   ALT 20 05/27/2012 0947   ALT 21 03/10/2012 1244   BILITOT 0.55 05/27/2012 0947   BILITOT 0.4 03/10/2012 1244       Lab Results  Component Value Date   LABCA2 12 05/27/2012    STUDIES:   *RADIOLOGY REPORT*  10/09/2011 Clinical Data: Screening.  LEFT DIGITAL SCREENING MAMMOGRAM WITH CAD  Comparison films: Multiple priors  Findings: The patient has had a right mastectomy. Two views of  the left breast demonstrate scattered fibroglandular densities. No  findings suspicious for malignancy.  Images were processed with CAD.  IMPRESSION:  No evidence of malignancy. Screening mammography is recommended in  one year.  BI-RADS CATEGORY 1: Negative  Original Report Authenticated By: Hiram Gash, M.D.   Bone density on 12/19/2011 was normal.   03/10/2012 *RADIOLOGY REPORT*  Clinical Data: Chest discomfort. Shortness of breath.  Hypertension. History of right breast cancer.  CT ANGIOGRAPHY CHEST  Technique: Multidetector CT imaging of the chest using the  standard protocol during bolus administration of intravenous  contrast. Multiplanar reconstructed images including MIPs were  obtained and reviewed to evaluate the vascular anatomy.  Contrast: OMNIPAQUE IOHEXOL 350 MG/ML SOLN  Comparison: CT chest 10/09/2010 and PET CT of that same date.  Findings: Contrast opacification of the pulmonary arteries is good.  Respiratory motion  blurred many of the images. Overall, the study  is of  moderate to good diagnostic quality.  No filling defects within either main pulmonary artery or their  branches in either lung to suggest pulmonary embolism. Heart size  upper normal with borderline to mild left ventricular hypertrophy.  No visible coronary calcification. Minimal aortic annular  calcification. Minimal atherosclerosis involving the thoracic and  upper abdominal aorta.  Peripheral ground-glass opacity in the anterior right upper lobe  measuring approximately 1.7 cm. No ground-glass opacities  elsewhere in either lung. Focal consolidation medially in the  right lower lobe. No pulmonary parenchymal nodules or masses. No  pleural effusions. Central airways patent without significant  bronchial wall thickening.  Prior right mastectomy and axillary node dissection. No evidence  of recurrent tumor in the right chest wall. No significant  mediastinal, hilar, or axillary lymphadenopathy. Visualized  thyroid gland unremarkable.  Visualized upper abdomen unremarkable. Bone window images  demonstrate degenerative changes and dish throughout the thoracic  spine, but no evidence of osseous metastatic disease.  IMPRESSION:  1. No evidence of central pulmonary embolism.  2. Solitary approximate 1.7 cm ground-glass opacity in the right  lung apex. While this is most likely inflammatory, an early  adenocarcinoma might have a similar appearance. Follow-up CT chest  in 3 months is suggested to determine if this GGO persists. If so,  then annual surveillance CT for the next 3 years would be  recommended. These recommendations are taken from "Recommendations  for the Management of Subsolid Pulmonary Nodules Detected at CT: A  Statement from the Fleischner Society" Radiology 2013; 266:1, 304-  317.  3. Atelectasis favored over pneumonia in the medial right lower  lobe. No acute cardiopulmonary disease otherwise.  4. Right mastectomy  without evidence of recurrent or metastatic  breast cancer.  Original Report Authenticated By: Hulan Saas, M.D.    ASSESSMENT: 67 year old BRCA-2 positive [name] woman,  (1) status post right mastectomy and axillary lymph node dissection August of 2012 for a T1c N1 (Stage II) invasive ductal carcinoma, grade 3, estrogen and progesterone receptor positive, HER-2 negative, with an MIB-1-1 of 62%,   (2) Status post 4 cycles of doxorubicin and cyclophosphamide given in dose dense fashion, followed  by weekly paclitaxel x 12 completed 06/24/2011  (3) status post-mastectomy radiation completed April 2013  (4)  started on anastrozole in may 2013, the goal being to continue for 5 years.  PLAN:  Marker continues to tolerate the anastrozole well and will continue at 1 mg daily. Our plan will be to continue the drug for 5 years.  As recommended, ordering a repeat chest CT, specifically to follow the 1.7 cm opacity in the right lung. I will plan on seeing her soon thereafter to review those results. Otherwise we'll see her for routine followup in 6 months, but she knows to call prior those appointments if she has any changes or problems.  Her next left mammogram will be due in June.   Maryama Kuriakose    06/03/2012

## 2012-06-03 NOTE — Telephone Encounter (Signed)
gv pt appt schedule for February and August. Pt aware central will call re February ct.

## 2012-06-04 ENCOUNTER — Other Ambulatory Visit: Payer: Self-pay | Admitting: Internal Medicine

## 2012-06-04 ENCOUNTER — Other Ambulatory Visit: Payer: Self-pay | Admitting: *Deleted

## 2012-06-04 DIAGNOSIS — I10 Essential (primary) hypertension: Secondary | ICD-10-CM

## 2012-06-04 MED ORDER — LISINOPRIL 40 MG PO TABS: 40.0000 mg | ORAL_TABLET | Freq: Every day | ORAL | Status: DC

## 2012-06-04 MED ORDER — PRAVASTATIN SODIUM 40 MG PO TABS: 40.0000 mg | ORAL_TABLET | Freq: Every day | ORAL | Status: DC

## 2012-06-04 MED ORDER — GLIPIZIDE 10 MG PO TABS: 10.0000 mg | ORAL_TABLET | Freq: Two times a day (BID) | ORAL | Status: DC

## 2012-06-04 MED ORDER — METFORMIN HCL 500 MG PO TABS: 500.0000 mg | ORAL_TABLET | Freq: Two times a day (BID) | ORAL | Status: DC

## 2012-06-04 MED ORDER — HYDROCHLOROTHIAZIDE 25 MG PO TABS: 25.0000 mg | ORAL_TABLET | Freq: Every day | ORAL | Status: DC

## 2012-06-07 NOTE — Telephone Encounter (Signed)
Called to pharm 

## 2012-06-14 ENCOUNTER — Encounter (HOSPITAL_COMMUNITY): Payer: Self-pay

## 2012-06-14 ENCOUNTER — Ambulatory Visit (HOSPITAL_COMMUNITY)
Admission: RE | Admit: 2012-06-14 | Discharge: 2012-06-14 | Disposition: A | Discharge status: Home / Self Care | Payer: Self-pay | Source: Ambulatory Visit | Attending: Physician Assistant | Admitting: Physician Assistant

## 2012-06-14 DIAGNOSIS — Z9010 Acquired absence of unspecified breast and nipple: Secondary | ICD-10-CM | POA: Insufficient documentation

## 2012-06-14 DIAGNOSIS — C50919 Malignant neoplasm of unspecified site of unspecified female breast: Secondary | ICD-10-CM | POA: Insufficient documentation

## 2012-06-14 DIAGNOSIS — C773 Secondary and unspecified malignant neoplasm of axilla and upper limb lymph nodes: Secondary | ICD-10-CM

## 2012-06-14 DIAGNOSIS — R911 Solitary pulmonary nodule: Secondary | ICD-10-CM | POA: Insufficient documentation

## 2012-06-14 MED ORDER — IOHEXOL 300 MG/ML  SOLN
80.0000 mL | Freq: Once | INTRAMUSCULAR | Status: AC | PRN
  Administered 2012-06-14: 80 mL via INTRAVENOUS

## 2012-06-15 ENCOUNTER — Other Ambulatory Visit: Payer: Self-pay | Admitting: Physician Assistant

## 2012-06-16 ENCOUNTER — Ambulatory Visit: Payer: Self-pay | Admitting: Physician Assistant

## 2012-07-07 DIAGNOSIS — M1710 Unilateral primary osteoarthritis, unspecified knee: Secondary | ICD-10-CM | POA: Diagnosis not present

## 2012-07-27 ENCOUNTER — Other Ambulatory Visit: Payer: Self-pay | Admitting: Orthopedic Surgery

## 2012-07-27 ENCOUNTER — Telehealth: Payer: Self-pay | Admitting: *Deleted

## 2012-07-27 NOTE — Telephone Encounter (Signed)
Lm informed pt that GCM is on pal for 12/13/12.Marland Kitchengv appts d/t and also made pt aware that i will mail a letter/cal.

## 2012-08-09 ENCOUNTER — Encounter (HOSPITAL_COMMUNITY): Payer: Self-pay | Admitting: Pharmacy Technician

## 2012-08-09 ENCOUNTER — Other Ambulatory Visit: Payer: Self-pay | Admitting: Orthopedic Surgery

## 2012-08-09 ENCOUNTER — Ambulatory Visit (HOSPITAL_COMMUNITY)
Admission: RE | Admit: 2012-08-09 | Discharge: 2012-08-09 | Disposition: A | Discharge status: Home / Self Care | Payer: Medicare Other | Source: Ambulatory Visit | Attending: Orthopedic Surgery | Admitting: Orthopedic Surgery

## 2012-08-09 ENCOUNTER — Encounter (HOSPITAL_COMMUNITY)
Admission: RE | Admit: 2012-08-09 | Discharge: 2012-08-09 | Disposition: A | Discharge status: Home / Self Care | Payer: Medicare Other | Source: Ambulatory Visit | Attending: Orthopedic Surgery | Admitting: Orthopedic Surgery

## 2012-08-09 ENCOUNTER — Encounter (HOSPITAL_COMMUNITY): Payer: Self-pay

## 2012-08-09 DIAGNOSIS — Z01812 Encounter for preprocedural laboratory examination: Secondary | ICD-10-CM | POA: Diagnosis not present

## 2012-08-09 DIAGNOSIS — Z01818 Encounter for other preprocedural examination: Secondary | ICD-10-CM | POA: Insufficient documentation

## 2012-08-09 DIAGNOSIS — E119 Type 2 diabetes mellitus without complications: Secondary | ICD-10-CM | POA: Diagnosis not present

## 2012-08-09 DIAGNOSIS — I10 Essential (primary) hypertension: Secondary | ICD-10-CM | POA: Diagnosis not present

## 2012-08-09 HISTORY — DX: Anesthesia of skin: R20.2

## 2012-08-09 HISTORY — DX: Paresthesia of skin: R20.0

## 2012-08-09 LAB — BASIC METABOLIC PANEL
BUN: 11 mg/dL (ref 6–23)
CO2: 27 mEq/L (ref 19–32)
Calcium: 9.8 mg/dL (ref 8.4–10.5)
Chloride: 100 mEq/L (ref 96–112)
Creatinine, Ser: 0.67 mg/dL (ref 0.50–1.10)
GFR calc Af Amer: >90 mL/min (ref >90)
GFR calc non Af Amer: >90 mL/min (ref >90)
Glucose, Bld: 112 mg/dL — ABNORMAL HIGH (ref 70–99)
Potassium: 3.4 mEq/L — ABNORMAL LOW (ref 3.5–5.1)
Sodium: 140 mEq/L (ref 135–145)

## 2012-08-09 LAB — URINALYSIS, ROUTINE W REFLEX MICROSCOPIC
Bilirubin Urine: NEGATIVE
Glucose, UA: NEGATIVE mg/dL
Hgb urine dipstick: NEGATIVE
Ketones, ur: NEGATIVE mg/dL
Nitrite: NEGATIVE
Protein, ur: NEGATIVE mg/dL
Specific Gravity, Urine: 1.018 (ref 1.005–1.030)
Urobilinogen, UA: 0.2 mg/dL (ref 0.0–1.0)
pH: 6 (ref 5.0–8.0)

## 2012-08-09 LAB — CBC WITH DIFFERENTIAL/PLATELET
Basophils Absolute: 0 10*3/uL (ref 0.0–0.1)
Basophils Relative: 1 % (ref 0–1)
Eosinophils Absolute: 0.1 10*3/uL (ref 0.0–0.7)
Eosinophils Relative: 2 % (ref 0–5)
HCT: 33.7 % — ABNORMAL LOW (ref 36.0–46.0)
Hemoglobin: 11.7 g/dL — ABNORMAL LOW (ref 12.0–15.0)
Lymphocytes Relative: 40 % (ref 12–46)
Lymphs Abs: 1.6 10*3/uL (ref 0.7–4.0)
MCH: 30.9 pg (ref 26.0–34.0)
MCHC: 34.7 g/dL (ref 30.0–36.0)
MCV: 88.9 fL (ref 78.0–100.0)
Monocytes Absolute: 0.2 10*3/uL (ref 0.1–1.0)
Monocytes Relative: 6 % (ref 3–12)
Neutro Abs: 2.1 10*3/uL (ref 1.7–7.7)
Neutrophils Relative %: 51 % (ref 43–77)
Platelets: 232 10*3/uL (ref 150–400)
RBC: 3.79 MIL/uL — ABNORMAL LOW (ref 3.87–5.11)
RDW: 15.6 % — ABNORMAL HIGH (ref 11.5–15.5)
WBC: 4 10*3/uL (ref 4.0–10.5)

## 2012-08-09 LAB — SURGICAL PCR SCREEN
MRSA, PCR: NEGATIVE
Staphylococcus aureus: NEGATIVE

## 2012-08-09 LAB — URINE MICROSCOPIC-ADD ON

## 2012-08-09 LAB — PROTIME-INR
INR: 1.02 (ref 0.00–1.49)
Prothrombin Time: 13.3 seconds (ref 11.6–15.2)

## 2012-08-09 LAB — ABO/RH: ABO/RH(D): O POS

## 2012-08-09 LAB — APTT: aPTT: 33 seconds (ref 24–37)

## 2012-08-09 NOTE — Pre-Procedure Instructions (Addendum)
[name]  08/09/2012   Your procedure is scheduled on:  08-16-2012  Monday   Report to [facility] at 5:30AM.  Call this number if you have problems the morning of surgery: [phone]   Remember:   Do not eat food or drink liquids after midnight.   Take these medicines the morning of surgery with A SIP OF WATER:metoprolol(Toprol XL)  If needed, diphenhydrAMINE (BENADRYL                    Do not take any diabetic medications the morning of surgery                  Do not wear jewelry, make-up or nail polish.  Do not wear lotions, powders, or perfumes. You may wear deodorant.  Do not shave 48 hours prior to surgery.   Do not bring valuables to the hospital.  Contacts, dentures or bridgework may not be worn into surgery.  Leave suitcase in the car. After surgery it may be brought to your room.   For patients admitted to the hospital, checkout time is 11:00 AM the day of discharge.   Patients discharged the day of surgery will not be allowed to drive home.    Special Instructions: Shower using CHG 2 nights before surgery and the night before surgery.  If you shower the day of surgery use CHG.  Use special wash - you have one bottle of CHG for all showers.  You should use approximately 1/3 of the bottle for each shower.   Please read over the following fact sheets that you were given: Pain Booklet, Coughing and Deep Breathing, Blood Transfusion Information and Surgical Site Infection Prevention

## 2012-08-09 NOTE — Progress Notes (Signed)
Spoke with [facility] @ Dr. Wadie Lessen office,will change consent to read arthroplasty instead of athreoscopy.

## 2012-08-10 LAB — URINE CULTURE: Colony Count: 25000

## 2012-08-12 NOTE — H&P (Signed)
[name] is an 67 y.o. female.   Chief Complaint: Right knee pain HPI:  [name] returns in followup for bilateral knee pain.  She has end-stage arthritis by x-ray in both knees but reports that the right knee is more symptomatic.  Both knees buckle several times each day, but she has not fallen yet.  The pain will often wake her from sleep at night and makes ambulation during the day very difficult.  She has tried narcotic pain medications and steroid injection with minimal improvement.  She also completed a course of formal physical therapy but her pain has continued to worsen.    Past Medical History  Diagnosis Date  . Allergy   . Hypertension   . Hyperlipidemia   . Fatty liver disease, nonalcoholic   . History of hyperkalemia   . Obesity   . Renal lesion   . Hx antineoplastic chemo  10/12 - 06/24/11    PACLITAX EL COMPLETED 06/24/11  . Anemia   . Diabetes mellitus ORAL MED  . OA (osteoarthritis) of knee     RIGHT LEG  . Mild nonproliferative diabetic retinopathy of right eye 03/19/11    Dr. Norva Pavlov  . Breast CA     (Rt) breast ca dx 4/12---  S/P RADICAL MASTECTOMY AND CHEMORADIATION  . Numbness and tingling     Hx: of in fingers and toes since chemotherapy    Past Surgical History  Procedure Laterality Date  . Knee arthroscopy  10-28-1999    RIGHT  . Mastectomy modified radical  12-04-2010    W/ LEFT PAC PLACEMENT (RIGHT BREAST W/ AXILLARY CONTENTS/ NODE BX'S  . Transthoracic echocardiogram  10-29-2010    NORMAL LVF/ EF 55-60%/ MILD MV REGURG  . Vaginal hysterectomy  AGE 63    W/ LSO  . Tubal ligation  YRS AGO  . Laparoscopy with laparoscopic right salpingo oophorectomy and lysis of pelvic and abdominal adhesions  March 2013    Dr. Jennette Kettle   . Portacath placement      REPLACED [facility] DUE TO MALFUNCTION (LEFT)  . Port-a-cath removal  09/24/2011    Procedure: REMOVAL PORT-A-CATH;  Surgeon: Clovis Pu. Cornett, MD;  Location: [facility];  Service: General;  Laterality:  N/A;  . Colonoscopy w/ polypectomy      Hx; of    Family History  Problem Relation Age of Onset  . Stroke Father   . Heart disease Sister   . Cancer Sister     breast  . Heart disease Brother   . Cancer Sister     COLON CANCER   Social History:  reports that she has never smoked. She has never used smokeless tobacco. She reports that she does not drink alcohol or use illicit drugs.  Allergies:  Allergies  Allergen Reactions  . Metformin Other (See Comments)    REACTION, diarrhea: 850 mg - diarrhea but tolerates 500 mg daily    No prescriptions prior to admission    No results found for this or any previous visit (from the past 48 hour(s)). No results found.  Review of Systems  Constitutional: Negative.   HENT: Positive for tinnitus.   Respiratory: Negative.   Cardiovascular: Negative.   Gastrointestinal: Negative.   Genitourinary: Negative.   Musculoskeletal: Positive for joint pain and falls.  Skin: Negative.   Neurological: Negative.   Endo/Heme/Allergies: Negative.   Psychiatric/Behavioral: Negative.     There were no vitals taken for this visit. Physical Exam  Constitutional: She is oriented  to person, place, and time. She appears well-developed and well-nourished.  HENT:  Head: Normocephalic.  Eyes: Pupils are equal, round, and reactive to light.  Cardiovascular: Normal heart sounds.   Respiratory: Effort normal.  Neurological: She is alert and oriented to person, place, and time.  Psychiatric: She has a normal mood and affect.     Assessment/Plan   Assess: End-stage arthritis of the knees with symptomatic worsening on the right  Plan: We have discussed right total knee arthroplasty in detail with Ms. Newcomer today.  She understands the risks and benefits and would like to proceed.  She will talk to [facility] about scheduling.  She will call if she needs any pain medication preoperatively.  Phelix Fudala M. 08/12/2012, 4:26 PM

## 2012-08-15 MED ORDER — DEXTROSE-NACL 5-0.45 % IV SOLN: INTRAVENOUS | Status: DC

## 2012-08-15 MED ORDER — CHLORHEXIDINE GLUCONATE 4 % EX LIQD: 60.0000 mL | Freq: Once | CUTANEOUS | Status: DC

## 2012-08-15 MED ORDER — CEFAZOLIN SODIUM-DEXTROSE 2-3 GM-% IV SOLR
2.0000 g | INTRAVENOUS | Status: AC
  Administered 2012-08-16: 2 g via INTRAVENOUS
  Filled 2012-08-15: qty 50

## 2012-08-16 ENCOUNTER — Encounter (HOSPITAL_COMMUNITY)
Admission: RE | Disposition: A | Discharge status: Home / Self Care | Payer: Self-pay | Source: Ambulatory Visit | Attending: Orthopedic Surgery

## 2012-08-16 ENCOUNTER — Encounter (HOSPITAL_COMMUNITY): Payer: Self-pay

## 2012-08-16 ENCOUNTER — Inpatient Hospital Stay (HOSPITAL_COMMUNITY)
Admission: RE | Admit: 2012-08-16 | Discharge: 2012-08-19 | DRG: 470 | Disposition: A | Discharge status: Home / Self Care | Payer: Medicare Other | Source: Ambulatory Visit | Attending: Orthopedic Surgery | Admitting: Orthopedic Surgery

## 2012-08-16 ENCOUNTER — Encounter (HOSPITAL_COMMUNITY): Payer: Self-pay | Admitting: Anesthesiology

## 2012-08-16 ENCOUNTER — Inpatient Hospital Stay (HOSPITAL_COMMUNITY): Payer: Medicare Other | Admitting: Anesthesiology

## 2012-08-16 DIAGNOSIS — M1711 Unilateral primary osteoarthritis, right knee: Secondary | ICD-10-CM | POA: Diagnosis present

## 2012-08-16 DIAGNOSIS — E11319 Type 2 diabetes mellitus with unspecified diabetic retinopathy without macular edema: Secondary | ICD-10-CM | POA: Diagnosis present

## 2012-08-16 DIAGNOSIS — D62 Acute posthemorrhagic anemia: Secondary | ICD-10-CM | POA: Diagnosis not present

## 2012-08-16 DIAGNOSIS — E669 Obesity, unspecified: Secondary | ICD-10-CM | POA: Diagnosis present

## 2012-08-16 DIAGNOSIS — K7689 Other specified diseases of liver: Secondary | ICD-10-CM | POA: Diagnosis present

## 2012-08-16 DIAGNOSIS — E1139 Type 2 diabetes mellitus with other diabetic ophthalmic complication: Secondary | ICD-10-CM | POA: Diagnosis present

## 2012-08-16 DIAGNOSIS — M1710 Unilateral primary osteoarthritis, unspecified knee: Principal | ICD-10-CM | POA: Diagnosis present

## 2012-08-16 DIAGNOSIS — E785 Hyperlipidemia, unspecified: Secondary | ICD-10-CM | POA: Diagnosis present

## 2012-08-16 DIAGNOSIS — Z6833 Body mass index (BMI) 33.0-33.9, adult: Secondary | ICD-10-CM

## 2012-08-16 DIAGNOSIS — M25569 Pain in unspecified knee: Secondary | ICD-10-CM | POA: Diagnosis not present

## 2012-08-16 DIAGNOSIS — Z853 Personal history of malignant neoplasm of breast: Secondary | ICD-10-CM | POA: Diagnosis not present

## 2012-08-16 DIAGNOSIS — I10 Essential (primary) hypertension: Secondary | ICD-10-CM | POA: Diagnosis present

## 2012-08-16 HISTORY — PX: TOTAL KNEE ARTHROPLASTY: SHX125

## 2012-08-16 LAB — GLUCOSE, CAPILLARY
Glucose-Capillary: 122 mg/dL — ABNORMAL HIGH (ref 70–99)
Glucose-Capillary: 158 mg/dL — ABNORMAL HIGH (ref 70–99)
Glucose-Capillary: 197 mg/dL — ABNORMAL HIGH (ref 70–99)
Glucose-Capillary: 226 mg/dL — ABNORMAL HIGH (ref 70–99)

## 2012-08-16 LAB — HEMOGLOBIN A1C
Hgb A1c MFr Bld: 6.8 % — ABNORMAL HIGH (ref <5.7)
Mean Plasma Glucose: 148 mg/dL — ABNORMAL HIGH (ref <117)

## 2012-08-16 SURGERY — ARTHROPLASTY, KNEE, TOTAL
Anesthesia: General | Site: Knee | Laterality: Right | Wound class: Clean

## 2012-08-16 MED ORDER — NEOSTIGMINE METHYLSULFATE 1 MG/ML IJ SOLN
INTRAMUSCULAR | Status: DC | PRN
  Administered 2012-08-16: 3 mg via INTRAVENOUS

## 2012-08-16 MED ORDER — METOCLOPRAMIDE HCL 5 MG/ML IJ SOLN
5.0000 mg | Freq: Three times a day (TID) | INTRAMUSCULAR | Status: DC | PRN
  Administered 2012-08-16: 10 mg via INTRAVENOUS
  Filled 2012-08-16: qty 2

## 2012-08-16 MED ORDER — ACETAMINOPHEN 10 MG/ML IV SOLN: 1000.0000 mg | Freq: Once | INTRAVENOUS | Status: DC

## 2012-08-16 MED ORDER — GLYCOPYRROLATE 0.2 MG/ML IJ SOLN
INTRAMUSCULAR | Status: DC | PRN
  Administered 2012-08-16: .4 mg via INTRAVENOUS

## 2012-08-16 MED ORDER — ONDANSETRON HCL 4 MG/2ML IJ SOLN
4.0000 mg | Freq: Four times a day (QID) | INTRAMUSCULAR | Status: DC | PRN
  Administered 2012-08-16: 4 mg via INTRAVENOUS
  Filled 2012-08-16: qty 2

## 2012-08-16 MED ORDER — MIDAZOLAM HCL 2 MG/2ML IJ SOLN: 0.5000 mg | Freq: Once | INTRAMUSCULAR | Status: DC | PRN

## 2012-08-16 MED ORDER — ALUM & MAG HYDROXIDE-SIMETH 200-200-20 MG/5ML PO SUSP: 30.0000 mL | ORAL | Status: DC | PRN

## 2012-08-16 MED ORDER — MENTHOL 3 MG MT LOZG: 1.0000 | LOZENGE | OROMUCOSAL | Status: DC | PRN

## 2012-08-16 MED ORDER — ASPIRIN EC 325 MG PO TBEC
325.0000 mg | DELAYED_RELEASE_TABLET | Freq: Every day | ORAL | Status: DC
  Administered 2012-08-17 – 2012-08-19 (×3): 325 mg via ORAL
  Filled 2012-08-16 (×5): qty 1

## 2012-08-16 MED ORDER — FENTANYL CITRATE 0.05 MG/ML IJ SOLN
INTRAMUSCULAR | Status: DC | PRN
  Administered 2012-08-16: 100 ug via INTRAVENOUS
  Administered 2012-08-16 (×3): 50 ug via INTRAVENOUS

## 2012-08-16 MED ORDER — MIDAZOLAM HCL 5 MG/5ML IJ SOLN
INTRAMUSCULAR | Status: DC | PRN
  Administered 2012-08-16: 1 mg via INTRAVENOUS

## 2012-08-16 MED ORDER — METHOCARBAMOL 500 MG PO TABS
500.0000 mg | ORAL_TABLET | Freq: Four times a day (QID) | ORAL | Status: DC | PRN
  Administered 2012-08-16 – 2012-08-17 (×2): 500 mg via ORAL
  Filled 2012-08-16 (×2): qty 1

## 2012-08-16 MED ORDER — PHENOL 1.4 % MT LIQD: 1.0000 | OROMUCOSAL | Status: DC | PRN

## 2012-08-16 MED ORDER — ONDANSETRON HCL 4 MG/2ML IJ SOLN
INTRAMUSCULAR | Status: DC | PRN
  Administered 2012-08-16: 4 mg via INTRAVENOUS

## 2012-08-16 MED ORDER — METFORMIN HCL 500 MG PO TABS
500.0000 mg | ORAL_TABLET | Freq: Two times a day (BID) | ORAL | Status: DC
  Administered 2012-08-16 – 2012-08-19 (×6): 500 mg via ORAL
  Filled 2012-08-16 (×8): qty 1

## 2012-08-16 MED ORDER — DIPHENHYDRAMINE HCL 12.5 MG/5ML PO ELIX: 12.5000 mg | ORAL_SOLUTION | ORAL | Status: DC | PRN

## 2012-08-16 MED ORDER — ACETAMINOPHEN 10 MG/ML IV SOLN
1000.0000 mg | Freq: Four times a day (QID) | INTRAVENOUS | Status: AC
  Administered 2012-08-16 – 2012-08-17 (×4): 1000 mg via INTRAVENOUS
  Filled 2012-08-16 (×4): qty 100

## 2012-08-16 MED ORDER — FENTANYL CITRATE 0.05 MG/ML IJ SOLN
INTRAMUSCULAR | Status: AC
  Filled 2012-08-16: qty 4

## 2012-08-16 MED ORDER — ACETAMINOPHEN 650 MG RE SUPP: 650.0000 mg | Freq: Four times a day (QID) | RECTAL | Status: DC | PRN

## 2012-08-16 MED ORDER — METOCLOPRAMIDE HCL 10 MG PO TABS: 5.0000 mg | ORAL_TABLET | Freq: Three times a day (TID) | ORAL | Status: DC | PRN

## 2012-08-16 MED ORDER — SODIUM CHLORIDE 0.9 % IR SOLN
Status: DC | PRN
  Administered 2012-08-16: 3000 mL

## 2012-08-16 MED ORDER — HYDROCHLOROTHIAZIDE 25 MG PO TABS
25.0000 mg | ORAL_TABLET | Freq: Every day | ORAL | Status: DC
  Administered 2012-08-16 – 2012-08-19 (×4): 25 mg via ORAL
  Filled 2012-08-16 (×4): qty 1

## 2012-08-16 MED ORDER — BUPIVACAINE LIPOSOME 1.3 % IJ SUSP
20.0000 mL | INTRAMUSCULAR | Status: DC
  Filled 2012-08-16: qty 20

## 2012-08-16 MED ORDER — KCL IN DEXTROSE-NACL 20-5-0.45 MEQ/L-%-% IV SOLN
INTRAVENOUS | Status: DC
Start: 2012-08-16 — End: 2012-08-19
  Administered 2012-08-16: 14:00:00 via INTRAVENOUS
  Filled 2012-08-16 (×11): qty 1000

## 2012-08-16 MED ORDER — LIDOCAINE HCL (CARDIAC) 20 MG/ML IV SOLN
INTRAVENOUS | Status: DC | PRN
  Administered 2012-08-16: 50 mg via INTRAVENOUS

## 2012-08-16 MED ORDER — GLIPIZIDE 10 MG PO TABS
10.0000 mg | ORAL_TABLET | Freq: Two times a day (BID) | ORAL | Status: DC
  Administered 2012-08-16 – 2012-08-19 (×6): 10 mg via ORAL
  Filled 2012-08-16 (×8): qty 1

## 2012-08-16 MED ORDER — ACETAMINOPHEN 10 MG/ML IV SOLN
INTRAVENOUS | Status: AC
  Filled 2012-08-16: qty 100

## 2012-08-16 MED ORDER — ONDANSETRON HCL 4 MG PO TABS
4.0000 mg | ORAL_TABLET | Freq: Four times a day (QID) | ORAL | Status: DC | PRN
  Administered 2012-08-19: 4 mg via ORAL
  Filled 2012-08-16: qty 1

## 2012-08-16 MED ORDER — ZOLPIDEM TARTRATE 5 MG PO TABS: 5.0000 mg | ORAL_TABLET | Freq: Every evening | ORAL | Status: DC | PRN

## 2012-08-16 MED ORDER — LACTATED RINGERS IV SOLN
INTRAVENOUS | Status: DC | PRN
  Administered 2012-08-16 (×2): via INTRAVENOUS

## 2012-08-16 MED ORDER — FENTANYL CITRATE 0.05 MG/ML IJ SOLN
25.0000 ug | INTRAMUSCULAR | Status: DC | PRN
  Administered 2012-08-16 (×2): 50 ug via INTRAVENOUS
  Administered 2012-08-16 (×2): 25 ug via INTRAVENOUS

## 2012-08-16 MED ORDER — MEPERIDINE HCL 25 MG/ML IJ SOLN: 6.2500 mg | INTRAMUSCULAR | Status: DC | PRN

## 2012-08-16 MED ORDER — HYDROMORPHONE HCL PF 1 MG/ML IJ SOLN
0.5000 mg | INTRAMUSCULAR | Status: DC | PRN
  Administered 2012-08-16 – 2012-08-17 (×6): 1 mg via INTRAVENOUS
  Filled 2012-08-16 (×5): qty 1

## 2012-08-16 MED ORDER — POLYVINYL ALCOHOL 1.4 % OP SOLN
2.0000 [drp] | OPHTHALMIC | Status: DC | PRN
  Filled 2012-08-16: qty 15

## 2012-08-16 MED ORDER — METHOCARBAMOL 100 MG/ML IJ SOLN: 500.0000 mg | Freq: Four times a day (QID) | INTRAVENOUS | Status: DC | PRN

## 2012-08-16 MED ORDER — CEFUROXIME SODIUM 1.5 G IJ SOLR
INTRAMUSCULAR | Status: AC
  Filled 2012-08-16: qty 1.5

## 2012-08-16 MED ORDER — PROMETHAZINE HCL 25 MG/ML IJ SOLN: 6.2500 mg | INTRAMUSCULAR | Status: DC | PRN

## 2012-08-16 MED ORDER — LISINOPRIL 40 MG PO TABS
40.0000 mg | ORAL_TABLET | Freq: Every day | ORAL | Status: DC
  Administered 2012-08-16 – 2012-08-19 (×4): 40 mg via ORAL
  Filled 2012-08-16 (×4): qty 1

## 2012-08-16 MED ORDER — METOPROLOL SUCCINATE ER 25 MG PO TB24
25.0000 mg | ORAL_TABLET | Freq: Every day | ORAL | Status: DC
  Administered 2012-08-17 – 2012-08-19 (×3): 25 mg via ORAL
  Filled 2012-08-16 (×3): qty 1

## 2012-08-16 MED ORDER — SIMVASTATIN 20 MG PO TABS
20.0000 mg | ORAL_TABLET | Freq: Every day | ORAL | Status: DC
  Administered 2012-08-17 – 2012-08-18 (×2): 20 mg via ORAL
  Filled 2012-08-16 (×4): qty 1

## 2012-08-16 MED ORDER — ANASTROZOLE 1 MG PO TABS
1.0000 mg | ORAL_TABLET | Freq: Every day | ORAL | Status: DC
  Administered 2012-08-16 – 2012-08-18 (×3): 1 mg via ORAL
  Filled 2012-08-16 (×4): qty 1

## 2012-08-16 MED ORDER — DIPHENHYDRAMINE HCL 25 MG PO CAPS: 25.0000 mg | ORAL_CAPSULE | ORAL | Status: DC | PRN

## 2012-08-16 MED ORDER — DOCUSATE SODIUM 100 MG PO CAPS
100.0000 mg | ORAL_CAPSULE | Freq: Two times a day (BID) | ORAL | Status: DC
  Administered 2012-08-16 – 2012-08-19 (×7): 100 mg via ORAL
  Filled 2012-08-16 (×8): qty 1

## 2012-08-16 MED ORDER — PROPOFOL 10 MG/ML IV BOLUS
INTRAVENOUS | Status: DC | PRN
  Administered 2012-08-16: 200 mg via INTRAVENOUS

## 2012-08-16 MED ORDER — OXYCODONE HCL 5 MG PO TABS
5.0000 mg | ORAL_TABLET | ORAL | Status: DC | PRN
  Administered 2012-08-16: 10 mg via ORAL
  Administered 2012-08-17: 5 mg via ORAL
  Administered 2012-08-17 (×2): 10 mg via ORAL
  Filled 2012-08-16: qty 1
  Filled 2012-08-16 (×3): qty 2

## 2012-08-16 MED ORDER — SUCCINYLCHOLINE CHLORIDE 20 MG/ML IJ SOLN
INTRAMUSCULAR | Status: DC | PRN
  Administered 2012-08-16: 100 mg via INTRAVENOUS

## 2012-08-16 MED ORDER — POLYVINYL ALCOHOL-POVIDONE 1.4-0.6 % OP SOLN: 2.0000 [drp] | OPHTHALMIC | Status: DC | PRN

## 2012-08-16 MED ORDER — HYDROMORPHONE HCL PF 1 MG/ML IJ SOLN
INTRAMUSCULAR | Status: AC
  Filled 2012-08-16: qty 1

## 2012-08-16 MED ORDER — CEFUROXIME SODIUM 1.5 G IJ SOLR
INTRAMUSCULAR | Status: DC | PRN
  Administered 2012-08-16: 1.5 g

## 2012-08-16 MED ORDER — SODIUM CHLORIDE 0.9 % IJ SOLN
INTRAMUSCULAR | Status: DC | PRN
  Administered 2012-08-16: 08:00:00

## 2012-08-16 MED ORDER — INSULIN ASPART 100 UNIT/ML ~~LOC~~ SOLN
0.0000 [IU] | Freq: Three times a day (TID) | SUBCUTANEOUS | Status: DC
  Administered 2012-08-16 – 2012-08-17 (×4): 3 [IU] via SUBCUTANEOUS
  Administered 2012-08-18: 2 [IU] via SUBCUTANEOUS
  Administered 2012-08-18: 3 [IU] via SUBCUTANEOUS
  Administered 2012-08-19: 2 [IU] via SUBCUTANEOUS

## 2012-08-16 MED ORDER — OXYCODONE HCL 5 MG PO TABS: 5.0000 mg | ORAL_TABLET | Freq: Once | ORAL | Status: DC | PRN

## 2012-08-16 MED ORDER — SODIUM CHLORIDE 0.9 % IJ SOLN: INTRAMUSCULAR | Status: DC | PRN

## 2012-08-16 MED ORDER — OXYCODONE HCL 5 MG/5ML PO SOLN: 5.0000 mg | Freq: Once | ORAL | Status: DC | PRN

## 2012-08-16 MED ORDER — LABETALOL HCL 5 MG/ML IV SOLN
INTRAVENOUS | Status: DC | PRN
  Administered 2012-08-16 (×2): 10 mg via INTRAVENOUS

## 2012-08-16 MED ORDER — ACETAMINOPHEN 325 MG PO TABS: 650.0000 mg | ORAL_TABLET | Freq: Four times a day (QID) | ORAL | Status: DC | PRN

## 2012-08-16 MED ORDER — ROCURONIUM BROMIDE 100 MG/10ML IV SOLN
INTRAVENOUS | Status: DC | PRN
  Administered 2012-08-16: 40 mg via INTRAVENOUS

## 2012-08-16 SURGICAL SUPPLY — 62 items
BANDAGE ELASTIC 6 VELCRO ST LF (GAUZE/BANDAGES/DRESSINGS) ×1 IMPLANT
BANDAGE ESMARK 6X9 LF (GAUZE/BANDAGES/DRESSINGS) ×1 IMPLANT
BLADE SAG 18X100X1.27 (BLADE) ×2 IMPLANT
BLADE SAW SGTL 13X75X1.27 (BLADE) ×2 IMPLANT
BLADE SURG ROTATE 9660 (MISCELLANEOUS) IMPLANT
BNDG CMPR 9X6 STRL LF SNTH (GAUZE/BANDAGES/DRESSINGS) ×1
BNDG CMPR MED 10X6 ELC LF (GAUZE/BANDAGES/DRESSINGS) ×1
BNDG ELASTIC 6X10 VLCR STRL LF (GAUZE/BANDAGES/DRESSINGS) ×2 IMPLANT
BNDG ESMARK 6X9 LF (GAUZE/BANDAGES/DRESSINGS) ×2
BOWL SMART MIX CTS (DISPOSABLE) ×2 IMPLANT
CEMENT HV SMART SET (Cement) ×4 IMPLANT
CLOTH BEACON ORANGE TIMEOUT ST (SAFETY) ×2 IMPLANT
COVER SURGICAL LIGHT HANDLE (MISCELLANEOUS) ×2 IMPLANT
CUFF TOURNIQUET SINGLE 34IN LL (TOURNIQUET CUFF) ×1 IMPLANT
CUFF TOURNIQUET SINGLE 44IN (TOURNIQUET CUFF) IMPLANT
DRAPE EXTREMITY T 121X128X90 (DRAPE) ×2 IMPLANT
DRAPE U-SHAPE 47X51 STRL (DRAPES) ×2 IMPLANT
DURAPREP 26ML APPLICATOR (WOUND CARE) ×2 IMPLANT
ELECT REM PT RETURN 9FT ADLT (ELECTROSURGICAL) ×2
ELECTRODE REM PT RTRN 9FT ADLT (ELECTROSURGICAL) ×1 IMPLANT
EVACUATOR 1/8 PVC DRAIN (DRAIN) ×2 IMPLANT
GAUZE XEROFORM 1X8 LF (GAUZE/BANDAGES/DRESSINGS) ×2 IMPLANT
GLOVE BIO SURGEON STRL SZ7 (GLOVE) ×3 IMPLANT
GLOVE BIO SURGEON STRL SZ7.5 (GLOVE) ×5 IMPLANT
GLOVE BIOGEL PI IND STRL 6.5 (GLOVE) IMPLANT
GLOVE BIOGEL PI IND STRL 7.0 (GLOVE) ×1 IMPLANT
GLOVE BIOGEL PI IND STRL 7.5 (GLOVE) IMPLANT
GLOVE BIOGEL PI IND STRL 8 (GLOVE) ×1 IMPLANT
GLOVE BIOGEL PI INDICATOR 6.5 (GLOVE) ×1
GLOVE BIOGEL PI INDICATOR 7.0 (GLOVE) ×1
GLOVE BIOGEL PI INDICATOR 7.5 (GLOVE) ×1
GLOVE BIOGEL PI INDICATOR 8 (GLOVE) ×1
GLOVE SURG SS PI 8.5 STRL IVOR (GLOVE) ×1
GLOVE SURG SS PI 8.5 STRL STRW (GLOVE) IMPLANT
GOWN PREVENTION PLUS XLARGE (GOWN DISPOSABLE) ×2 IMPLANT
GOWN STRL NON-REIN LRG LVL3 (GOWN DISPOSABLE) ×4 IMPLANT
HANDPIECE INTERPULSE COAX TIP (DISPOSABLE) ×2
HOOD PEEL AWAY FACE SHEILD DIS (HOOD) ×6 IMPLANT
KIT BASIN OR (CUSTOM PROCEDURE TRAY) ×2 IMPLANT
KIT ROOM TURNOVER OR (KITS) ×2 IMPLANT
MANIFOLD NEPTUNE II (INSTRUMENTS) ×2 IMPLANT
NDL 18GX1X1/2 (RX/OR ONLY) (NEEDLE) IMPLANT
NEEDLE 18GX1X1/2 (RX/OR ONLY) (NEEDLE) ×2 IMPLANT
NEEDLE 22X1 1/2 (OR ONLY) (NEEDLE) ×1 IMPLANT
NS IRRIG 1000ML POUR BTL (IV SOLUTION) ×2 IMPLANT
PACK TOTAL JOINT (CUSTOM PROCEDURE TRAY) ×2 IMPLANT
PAD ARMBOARD 7.5X6 YLW CONV (MISCELLANEOUS) ×4 IMPLANT
PADDING CAST COTTON 6X4 STRL (CAST SUPPLIES) ×2 IMPLANT
SET HNDPC FAN SPRY TIP SCT (DISPOSABLE) ×1 IMPLANT
SPONGE GAUZE 4X4 12PLY (GAUZE/BANDAGES/DRESSINGS) ×3 IMPLANT
STAPLER VISISTAT 35W (STAPLE) ×2 IMPLANT
SUCTION FRAZIER TIP 10 FR DISP (SUCTIONS) ×2 IMPLANT
SUT VIC AB 0 CTX 36 (SUTURE) ×2
SUT VIC AB 0 CTX36XBRD ANTBCTR (SUTURE) ×1 IMPLANT
SUT VIC AB 1 CTX 36 (SUTURE) ×2
SUT VIC AB 1 CTX36XBRD ANBCTR (SUTURE) ×1 IMPLANT
SUT VIC AB 2-0 CT1 27 (SUTURE) ×2
SUT VIC AB 2-0 CT1 TAPERPNT 27 (SUTURE) ×1 IMPLANT
TOWEL OR 17X24 6PK STRL BLUE (TOWEL DISPOSABLE) ×2 IMPLANT
TOWEL OR 17X26 10 PK STRL BLUE (TOWEL DISPOSABLE) ×2 IMPLANT
TRAY FOLEY CATH 14FR (SET/KITS/TRAYS/PACK) ×1 IMPLANT
WATER STERILE IRR 1000ML POUR (IV SOLUTION) ×4 IMPLANT

## 2012-08-16 NOTE — Anesthesia Postprocedure Evaluation (Signed)
  Anesthesia Post-op Note  Patient: [name]  Procedure(s) Performed: Procedure(s): TOTAL KNEE ARTHROPLASTY (Right)  Patient Location: PACU  Anesthesia Type:General  Level of Consciousness: awake, alert , oriented and patient cooperative  Airway and Oxygen Therapy: Patient Spontanous Breathing and Patient connected to nasal cannula oxygen  Post-op Pain: mild  Post-op Assessment: Post-op Vital signs reviewed, Patient's Cardiovascular Status Stable, Respiratory Function Stable, Patent Airway, No signs of Nausea or vomiting and Pain level controlled  Post-op Vital Signs: Reviewed and stable  Complications: No apparent anesthesia complications

## 2012-08-16 NOTE — Transfer of Care (Signed)
Immediate Anesthesia Transfer of Care Note  Patient: [name]  Procedure(s) Performed: Procedure(s): TOTAL KNEE ARTHROPLASTY (Right)  Patient Location: PACU  Anesthesia Type:General  Level of Consciousness: awake, alert  and oriented  Airway & Oxygen Therapy: Patient Spontanous Breathing and Patient connected to nasal cannula oxygen  Post-op Assessment: Report given to PACU RN and Post -op Vital signs reviewed and stable  Post vital signs: Reviewed and stable  Complications: No apparent anesthesia complications

## 2012-08-16 NOTE — Preoperative (Signed)
Beta Blockers   Reason not to administer Beta Blockers:took toprol this am

## 2012-08-16 NOTE — Progress Notes (Signed)
Orthopedic Tech Progress Note Patient Details:  [name] 1945-05-07 [id number]  CPM Right Knee CPM Right Knee: On Right Knee Flexion (Degrees): 60 Right Knee Extension (Degrees): 0   Lesle Chris 08/16/2012, 11:05 AM

## 2012-08-16 NOTE — Progress Notes (Signed)
UR COMPLETED  

## 2012-08-16 NOTE — Op Note (Signed)
PATIENT ID:      [name]  MRN:     [id number] DOB/AGE:    Mar 28, 1946 / 67 y.o.       OPERATIVE REPORT    DATE OF PROCEDURE:  08/16/2012       PREOPERATIVE DIAGNOSIS:   RIGHT KNEE DEGENERATIVE JOINT DISEASE      Estimated body mass index is 33.21 kg/(m^2) as calculated from the following:   Height as of 08/09/12: 4\' 11"  (1.499 m).   Weight as of 06/03/12: 74.617 kg (164 lb 8 oz).                                                        POSTOPERATIVE DIAGNOSIS:   RIGHT KNEE DEGENERATIVE JOINT DISEASE                                                                      PROCEDURE:  Procedure(s): TOTAL KNEE ARTHROPLASTY Using Depuy Sigma RP implants #2.5 Femur, #3Tibia, 10mm sigma RP bearing, 35 Patella     SURGEON: Christopher Glasscock J    ASSISTANT:   Emerson Monte, RNFA, present throughout case         ANESTHESIA: GET   DRAINS: foley, 2 medium hemovac in knee   TOURNIQUET TIME:   COMPLICATIONS:  None     SPECIMENS: None   INDICATIONS FOR PROCEDURE: The patient has  RIGHT KNEE DEGENERATIVE JOINT DISEASE, varus deformities, XR shows bone on bone arthritis. Patient has failed all conservative measures including anti-inflammatory medicines, narcotics, attempts at  exercise and weight loss, cortisone injections and viscosupplementation.  Risks and benefits of surgery have been discussed, questions answered.   DESCRIPTION OF PROCEDURE: The patient identified by armband, received  IV antibiotics, in the holding area at [facility]. Patient taken to the operating room, appropriate anesthetic  monitors were attached, and general endotracheal anesthesia induced with  the patient in supine position, Foley catheter was inserted. Tourniquet  applied high to the operative thigh. Lateral post and foot positioner  applied to the table, the lower extremity was then prepped and draped  in usual sterile fashion from the ankle to the tourniquet. Time-out procedure was performed. The limb was  wrapped with an Esmarch bandage and the tourniquet inflated to 350 mmHg. We began the operation by making the anterior midline incision starting at handbreadth above the patella going over the patella 1 cm medial to and  4 cm distal to the tibial tubercle. Small bleeders in the skin and the  subcutaneous tissue identified and cauterized. Transverse retinaculum was incised and reflected medially and a medial parapatellar arthrotomy was accomplished. the patella was everted and theprepatellar fat pad resected. The superficial medial collateral  ligament was then elevated from anterior to posterior along the proximal  flare of the tibia and anterior half of the menisci resected. The knee was hyperflexed exposing bone on bone arthritis. Peripheral and notch osteophytes as well as the cruciate ligaments were then resected. We continued to  work our way around posteriorly along the proximal tibia, and externally  rotated the  tibia subluxing it out from underneath the femur. A McHale  retractor was placed through the notch and a lateral Hohmann retractor  placed, and we then drilled through the proximal tibia in line with the  axis of the tibia followed by an intramedullary guide rod and 2-degree  posterior slope cutting guide. The tibial cutting guide was pinned into place  allowing resection of 0 mm of bone medially and about 8 mm of bone  laterally because of her varus deformity. Satisfied with the tibial resection, we then  entered the distal femur 2 mm anterior to the PCL origin with the  intramedullary guide rod and applied the distal femoral cutting guide  set at 11mm, with 5 degrees of valgus. This was pinned along the  epicondylar axis. At this point, the distal femoral cut was accomplished without difficulty. We then sized for a #2.5 femoral component and pinned the guide in 3 degrees of external rotation.The chamfer cutting guide was pinned into place. The anterior, posterior, and chamfer cuts  were accomplished without difficulty followed by  the Sigma RP box cutting guide and the box cut. We also removed posterior osteophytes from the posterior femoral condyles. At this  time, the knee was brought into full extension. We checked our  extension and flexion gaps and found them symmetric at 10mm.  The patella thickness measured at 23 mm. We set the cutting guide at 15 and removed the posterior 10 mm  of the patella, sized for a 35 button and drilled the lollipop. The knee  was then once again hyperflexed exposing the proximal tibia. We sized for a #3 tibial base plate, applied the smokestack and the conical reamer followed by the the Delta fin keel punch. We then hammered into place the Sigma RP trial femoral component, inserted a 10-mm trial bearing, trial patellar button, and took the knee through range of motion from 0-130 degrees. No thumb pressure was required for patellar  tracking. At this point, all trial components were removed, a double batch of DePuy HV cement with 1500 mg of Zinacef was mixed and applied to all bony metallic mating surfaces except for the posterior condyles of the femur itself. In order, we  hammered into place the tibial tray and removed excess cement, the femoral component and removed excess cement, a 10-mm Sigma RP bearing  was inserted, and the knee brought to full extension with compression.  The patellar button was clamped into place, and excess cement  removed. While the cement cured the wound was irrigated out with normal saline solution pulse lavage, and medium Hemovac drains were placed from an anterolateral  approach. Ligament stability and patellar tracking were checked and found to be excellent. The parapatellar arthrotomy was closed with  running #1 Vicryl suture. The subcutaneous tissue with 0 and 2-0 undyed  Vicryl suture, and the skin with skin staples. A dressing of Xeroform,  4 x 4, dressing sponges, Webril, and Ace wrap applied. The patient   awakened, extubated, and taken to recovery room without difficulty.   Karyss Frese J 08/16/2012, 9:19 AM

## 2012-08-16 NOTE — Evaluation (Signed)
Physical Therapy Evaluation Patient Details Name: [name] MRN: [id number] DOB: 02-06-1946 Today's Date: 08/16/2012 Time: 5784-6962 PT Time Calculation (min): 21 min  PT Assessment / Plan / Recommendation Clinical Impression  Patient is a 67 yo female s/p Rt TKA.  Today's session limited by nausea.  Will benefit from acute PT to maximize independence prior to discharge home with husband.    PT Assessment  Patient needs continued PT services    Follow Up Recommendations  Home health PT;Supervision/Assistance - 24 hour    Does the patient have the potential to tolerate intense rehabilitation      Barriers to Discharge        Equipment Recommendations       Recommendations for Other Services     Frequency 7X/week    Precautions / Restrictions Precautions Precautions: Knee Precaution Booklet Issued: Yes (comment) Precaution Comments: Provided precaution education to patient. Restrictions Weight Bearing Restrictions: Yes RLE Weight Bearing: Weight bearing as tolerated   Pertinent Vitals/Pain       Mobility  Bed Mobility Bed Mobility: Supine to Sit;Sitting - Scoot to Edge of Bed Supine to Sit: 4: Min assist Sitting - Scoot to Edge of Bed: 4: Min assist Details for Bed Mobility Assistance: Verbal cues for technique.  Assist to move RLE off of bed.  Patient sat EOB x 12 minutes.  Patient became nauseated (vomited minimally - RN notified).  Balance good in sitting. Transfers Transfers: Sit to Stand;Stand to Sit;Stand Pivot Transfers Sit to Stand: 1: +2 Total assist;With upper extremity assist;From bed Sit to Stand: Patient Percentage: 80% Stand to Sit: 1: +2 Total assist;With upper extremity assist;With armrests;To chair/3-in-1 Stand to Sit: Patient Percentage: 80% Stand Pivot Transfers: 1: +2 Total assist Stand Pivot Transfers: Patient Percentage: 80% Details for Transfer Assistance: Verbal cues for hand placement and safe technique.  Assist to rise to standing.   Patient able to take several steps to pivot to chair. Ambulation/Gait Ambulation/Gait Assistance: Not tested (comment)    Exercises Total Joint Exercises Ankle Circles/Pumps: AROM;Both;10 reps;Seated   PT Diagnosis: Difficulty walking;Acute pain  PT Problem List: Decreased strength;Decreased range of motion;Decreased activity tolerance;Decreased balance;Decreased mobility;Decreased knowledge of use of DME;Decreased knowledge of precautions;Pain PT Treatment Interventions: DME instruction;Gait training;Stair training;Functional mobility training;Therapeutic exercise;Patient/family education   PT Goals Acute Rehab PT Goals PT Goal Formulation: With patient Time For Goal Achievement: 08/23/12 Potential to Achieve Goals: Good Pt will go Supine/Side to Sit: Independently;with HOB 0 degrees PT Goal: Supine/Side to Sit - Progress: Goal set today Pt will go Sit to Supine/Side: Independently;with HOB 0 degrees PT Goal: Sit to Supine/Side - Progress: Goal set today Pt will go Sit to Stand: with supervision;with upper extremity assist PT Goal: Sit to Stand - Progress: Goal set today Pt will Ambulate: 51 - 150 feet;with supervision;with rolling walker PT Goal: Ambulate - Progress: Goal set today Pt will Go Up / Down Stairs: 3-5 stairs;with min assist;with least restrictive assistive device PT Goal: Up/Down Stairs - Progress: Goal set today Pt will Perform Home Exercise Program: with supervision, verbal cues required/provided PT Goal: Perform Home Exercise Program - Progress: Goal set today  Visit Information  Last PT Received On: 08/16/12 Assistance Needed: +1    Subjective Data  Subjective: Patient reports feeling nauseated during sitting.   Patient Stated Goal: To return home.   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, advertising Overall Cognitive Status: Appears within functional limits for tasks assessed/performed Arousal/Alertness: Awake/alert Orientation Level: Appears intact for tasks assessed Behavior During Session: [facility] for tasks performed    Extremity/Trunk Assessment Right Upper Extremity Assessment RUE ROM/Strength/Tone: WFL for tasks assessed RUE Sensation: WFL - Light Touch Left Upper Extremity Assessment LUE ROM/Strength/Tone: WFL for tasks assessed LUE Sensation: WFL - Light Touch Right Lower Extremity Assessment RLE ROM/Strength/Tone: Deficits;Unable to fully assess;Due to pain RLE ROM/Strength/Tone Deficits: Able to assist with moving RLE off of bed. Left Lower Extremity Assessment LLE ROM/Strength/Tone: WFL for tasks assessed LLE Sensation: WFL - Light Touch Trunk Assessment Trunk Assessment: Normal   Balance Balance Balance Assessed: Yes Static Sitting Balance Static Sitting - Balance Support: No upper extremity supported;Feet supported Static Sitting - Level of Assistance: 5: Stand by assistance Static Sitting - Comment/# of Minutes: 12  End of Session PT - End of Session Equipment Utilized During Treatment: Gait belt Activity Tolerance: Patient limited by pain;Treatment limited secondary to medical complications (Comment) (Limited by nausea) Patient left: in chair;with call bell/phone within reach Nurse Communication: Mobility status (Out of CPM at 2:35 pm) CPM Right Knee CPM Right Knee: Off  GP     Vena Austria 08/16/2012, 4:51 PM Durenda Hurt. Renaldo Fiddler, [facility] Acute Rehab Services Pager [phone]

## 2012-08-16 NOTE — Interval H&P Note (Signed)
History and Physical Interval Note:  08/16/2012 6:52 AM  [name]  has presented today for surgery, with the diagnosis of RIGHT KNEE DEGENERATIVE JOINT DISEASE  The various methods of treatment have been discussed with the patient and family. After consideration of risks, benefits and other options for treatment, the patient has consented to  Procedure(s): TOTAL KNEE ARTHROPLASTY (Right) as a surgical intervention .  The patient's history has been reviewed, patient examined, no change in status, stable for surgery.  I have reviewed the patient's chart and labs.  Questions were answered to the patient's satisfaction.     [name]

## 2012-08-16 NOTE — Anesthesia Preprocedure Evaluation (Addendum)
Anesthesia Evaluation  Patient identified by MRN, date of birth, ID band Patient awake    Reviewed: Allergy & Precautions, H&P , NPO status , Patient's Chart, lab work & pertinent test results, reviewed documented beta blocker date and time   History of Anesthesia Complications Negative for: history of anesthetic complications  Airway Mallampati: II TM Distance: >3 FB Neck ROM: Full    Dental  (+) Teeth Intact and Dental Advisory Given   Pulmonary  breath sounds clear to auscultation  Pulmonary exam normal       Cardiovascular hypertension, Pt. on medications and Pt. on home beta blockers Rhythm:Regular Rate:Normal  '13 ECHO: global hypokinesis, EF 40%   Neuro/Psych negative neurological ROS     GI/Hepatic negative GI ROS, Neg liver ROS,   Endo/Other  diabetes (glu 122), Well Controlled, Type 2, Oral Hypoglycemic Agents  Renal/GU Renal disease (? renal lesion, function OK)     Musculoskeletal   Abdominal (+) + obese,   Peds  Hematology negative hematology ROS (+)   Anesthesia Other Findings Breast cancer: surgery, XRT, and chemo  Reproductive/Obstetrics                          Anesthesia Physical Anesthesia Plan  ASA: III  Anesthesia Plan: General   Post-op Pain Management:    Induction: Intravenous  Airway Management Planned: LMA  Additional Equipment:   Intra-op Plan:   Post-operative Plan:   Informed Consent: I have reviewed the patients History and Physical, chart, labs and discussed the procedure including the risks, benefits and alternatives for the proposed anesthesia with the patient or authorized representative who has indicated his/her understanding and acceptance.   Dental advisory given  Plan Discussed with: CRNA and Surgeon  Anesthesia Plan Comments: (Plan routine monitors, GA- LMA OK No femoral nerve block, surgeon using exparel)        Anesthesia Quick  Evaluation

## 2012-08-17 ENCOUNTER — Encounter (HOSPITAL_COMMUNITY): Payer: Self-pay | Admitting: Orthopedic Surgery

## 2012-08-17 LAB — BASIC METABOLIC PANEL
BUN: 11 mg/dL (ref 6–23)
CO2: 28 mEq/L (ref 19–32)
Calcium: 9 mg/dL (ref 8.4–10.5)
Chloride: 96 mEq/L (ref 96–112)
Creatinine, Ser: 0.66 mg/dL (ref 0.50–1.10)
GFR calc Af Amer: >90 mL/min (ref >90)
GFR calc non Af Amer: >90 mL/min (ref >90)
Glucose, Bld: 196 mg/dL — ABNORMAL HIGH (ref 70–99)
Potassium: 4.2 mEq/L (ref 3.5–5.1)
Sodium: 134 mEq/L — ABNORMAL LOW (ref 135–145)

## 2012-08-17 LAB — CBC
HCT: 25 % — ABNORMAL LOW (ref 36.0–46.0)
Hemoglobin: 8.8 g/dL — ABNORMAL LOW (ref 12.0–15.0)
MCH: 31 pg (ref 26.0–34.0)
MCHC: 35.2 g/dL (ref 30.0–36.0)
MCV: 88 fL (ref 78.0–100.0)
Platelets: 181 10*3/uL (ref 150–400)
RBC: 2.84 MIL/uL — ABNORMAL LOW (ref 3.87–5.11)
RDW: 16.2 % — ABNORMAL HIGH (ref 11.5–15.5)
WBC: 7.1 10*3/uL (ref 4.0–10.5)

## 2012-08-17 LAB — GLUCOSE, CAPILLARY
Glucose-Capillary: 176 mg/dL — ABNORMAL HIGH (ref 70–99)
Glucose-Capillary: 154 mg/dL — ABNORMAL HIGH (ref 70–99)
Glucose-Capillary: 164 mg/dL — ABNORMAL HIGH (ref 70–99)
Glucose-Capillary: 141 mg/dL — ABNORMAL HIGH (ref 70–99)

## 2012-08-17 NOTE — Progress Notes (Signed)
Patient ID: [name], female   DOB: 16-Mar-1946, 67 y.o.   MRN: [id number] PATIENT ID: [name]  MRN: [id number]  DOB/AGE:  [birth year] / 67 y.o.  1 Day Post-Op Procedure(s) (LRB): TOTAL KNEE ARTHROPLASTY (Right)    PROGRESS NOTE Subjective: Patient is alert, oriented, no Nausea, no Vomiting, yes passing gas, no Bowel Movement. Taking PO sips. Denies SOB, Chest or Calf Pain. Using Incentive Spirometer, PAS in place. Ambulate WBAT, CPM 0-60, patient set up, and took a few steps in the room yesterday. Patient reports pain as 5 on 0-10 scale  .    Objective: Vital signs in last 24 hours: Filed Vitals:   08/16/12 1353 08/16/12 1724 08/16/12 2225 08/17/12 0617  BP: 152/85 187/88 135/67 140/81  Pulse: 67 81 79 75  Temp: 98.9 F (37.2 C)  98.2 F (36.8 C) 98.9 F (37.2 C)  TempSrc:      Resp: 20 18 18 18   SpO2: 100% 100% 100% 100%      Intake/Output from previous day: I/O last 3 completed shifts: In: 3460 [P.O.:360; I.V.:3100] Out: 1400 [Urine:1000; Drains:400]   Intake/Output this shift:     LABORATORY DATA:  Recent Labs  08/16/12 0927 08/16/12 1607 08/16/12 2136 08/17/12 0625  WBC  --   --   --  7.1  HGB  --   --   --  8.8*  HCT  --   --   --  25.0*  PLT  --   --   --  181  NA  --   --   --  134*  K  --   --   --  4.2  CL  --   --   --  96  CO2  --   --   --  28  BUN  --   --   --  11  CREATININE  --   --   --  0.66  GLUCOSE  --   --   --  196*  GLUCAP 158* 197* 226*  --   CALCIUM  --   --   --  9.0    Examination: Neurologically intact ABD soft Neurovascular intact Sensation intact distally Intact pulses distally Dorsiflexion/Plantar flexion intact Incision: no drainage No cellulitis present Compartment soft}  Assessment:   1 Day Post-Op Procedure(s) (LRB): TOTAL KNEE ARTHROPLASTY (Right) ADDITIONAL DIAGNOSIS:  Diabetes, Hypertension and History of breast cancer in 2012, chemotherapy completed.  Plan: PT/OT WBAT, CPM 5/hrs day until  ROM 0-90 degrees, then D/C CPM DVT Prophylaxis:  SCDx72hrs, ASA 325 mg BID x 2 weeks DISCHARGE PLAN: Home, patient reports that she has adequate help to go home hopefully by tomorrow. DISCHARGE NEEDS: HHPT, HHRN, CPM, Walker and 3-in-1 comode seat     [name] J 08/17/2012, 7:35 AM

## 2012-08-17 NOTE — Progress Notes (Signed)
Physical Therapy Treatment Patient Details Name: [name] MRN: [id number] DOB: 1945/06/07 Today's Date: 08/17/2012 Time: 9147-8295 PT Time Calculation (min): 24 min  PT Assessment / Plan / Recommendation Comments on Treatment Session  Pt limited by pain but able to stand and gait with supervision.  Pt with very limited ROM in R knee flexion, encouraged pt to perform self AAROM in room.    Follow Up Recommendations  Home health PT;Supervision/Assistance - 24 hour     Does the patient have the potential to tolerate intense rehabilitation     Barriers to Discharge        Equipment Recommendations       Recommendations for Other Services    Frequency 7X/week   Plan Discharge plan remains appropriate    Precautions / Restrictions Precautions Precautions: Knee Restrictions RLE Weight Bearing: Weight bearing as tolerated   Pertinent Vitals/Pain Pt c/o 7/10 pain in R knee, repositioned, RN aware.    Mobility  Transfers Sit to Stand: 5: Supervision;From chair/3-in-1 Stand to Sit: 5: Supervision;To chair/3-in-1 Ambulation/Gait Ambulation/Gait Assistance: 5: Supervision Ambulation Distance (Feet): 50 Feet Assistive device: Rolling walker Gait Pattern: Step-to pattern;Decreased stance time - right Gait velocity: slow    Exercises Total Joint Exercises Ankle Circles/Pumps: AROM;10 reps;Both Quad Sets: AROM;Right;10 reps Heel Slides: AAROM;Right;10 reps Hip ABduction/ADduction: AAROM;Right;10 reps   PT Diagnosis:    PT Problem List:   PT Treatment Interventions:     PT Goals Acute Rehab PT Goals PT Goal: Sit to Stand - Progress: Met PT Goal: Ambulate - Progress: Progressing toward goal PT Goal: Perform Home Exercise Program - Progress: Progressing toward goal  Visit Information  Last PT Received On: 08/17/12 Assistance Needed: +1    Subjective Data  Subjective: I'm in pain but I guess I'll try   Cognition  Cognition Overall Cognitive Status: Appears  within functional limits for tasks assessed/performed Behavior During Session: [facility] for tasks performed    Balance     End of Session PT - End of Session Equipment Utilized During Treatment: Gait belt Activity Tolerance: Patient limited by pain Patient left: in chair;with call bell/phone within reach CPM Right Knee CPM Right Knee: Off Right Knee Flexion (Degrees): 40 Right Knee Extension (Degrees): 0   GP     Harjit Leider 08/17/2012, 1:58 PM

## 2012-08-17 NOTE — Care Management Note (Signed)
CARE MANAGEMENT NOTE 08/17/2012  Patient:  ROSALYN, ARCHAMBAULT A   Account Number:  [id number]  Date Initiated:  08/17/2012  Documentation initiated by:  Vance Peper  Subjective/Objective Assessment:   67 yr old female s/p right total knee arthroplasty.     Action/Plan:   CM spoke with patient concerning home health and DME needs at discharge. Choice offered. Patient preoperatively setup with [facility], no changes. Rolling walker, 3in1 and CPM have been delivered to patient's home. Has support at discharge.   Anticipated DC Date:  08/18/2012   Anticipated DC Plan:  HOME W HOME HEALTH SERVICES      DC Planning Services  CM consult      [facility] Choice  HOME HEALTH   Choice offered to / List presented to:  C-1 Patient      DME agency  [employer]     HH arranged  HH-2 PT      [facility] agency  [facility]   Status of service:  Completed, signed off Medicare Important Message given?   (If response is "NO", the following Medicare IM given date fields will be blank) Date Medicare IM given:   Date Additional Medicare IM given:    Discharge Disposition:  HOME W HOME HEALTH SERVICES  Per UR Regulation:    If discussed at Long Length of Stay Meetings, dates discussed:    Comments:

## 2012-08-17 NOTE — Evaluation (Signed)
Occupational Therapy Evaluation Patient Details Name: [name] MRN: [id number] DOB: Oct 14, 1945 Today's Date: 08/17/2012 Time: 0912-0927 OT Time Calculation (min): 15 min  OT Assessment / Plan / Recommendation Clinical Impression    Patient is a 67 yo female s/p Rt TKA. Pt at Min A level for LB ADLs. Will have assistance available from spouse/neighbors upon d/c. Will benefit from acute OT to maximize independence prior to discharge home with husband.     OT Assessment  Patient needs continued OT Services    Follow Up Recommendations  No OT follow up;Supervision/Assistance - 24 hour    Barriers to Discharge None    Equipment Recommendations  None recommended by OT    Recommendations for Other Services    Frequency  Min 2X/week    Precautions / Restrictions Precautions Precautions: Knee Restrictions Weight Bearing Restrictions: Yes RLE Weight Bearing: Weight bearing as tolerated   Pertinent Vitals/Pain Pain 7/10 in RLE. Repositioned.     ADL  Eating/Feeding: Independent Where Assessed - Eating/Feeding: Chair Grooming: Wash/dry hands;Supervision/safety Where Assessed - Grooming: Supported standing Lower Body Bathing: Minimal assistance Where Assessed - Lower Body Bathing: Supported sit to stand Lower Body Dressing: Minimal assistance Where Assessed - Lower Body Dressing: Supported sit to Pharmacist, hospital: Hydrographic surveyor Method: Sit to Barista: Raised toilet seat with arms (or 3-in-1 over toilet) Toileting - Clothing Manipulation and Hygiene: Min guard Where Assessed - Engineer, mining and Hygiene: Sit to stand from 3-in-1 or toilet Equipment Used: Rolling walker;Sock aid Transfers/Ambulation Related to ADLs: Supervision-cues to stay in walker ADL Comments: Pt able to don sock with sockaid.Unable to reach feet. Educated on AE available. Pt at Min A level for LB ADLs (sit to stand)- Pt educated on shower  transfer.      OT Diagnosis: Acute pain  OT Problem List: Decreased strength;Decreased range of motion;Decreased activity tolerance;Impaired balance (sitting and/or standing);Decreased knowledge of use of DME or AE;Pain;Decreased knowledge of precautions OT Treatment Interventions: Self-care/ADL training;DME and/or AE instruction;Therapeutic activities;Patient/family education;Balance training   OT Goals Acute Rehab OT Goals OT Goal Formulation: With patient Time For Goal Achievement: 08/24/12 Potential to Achieve Goals: Good ADL Goals Pt Will Perform Lower Body Bathing: with modified independence;Sit to stand from chair ADL Goal: Lower Body Bathing - Progress: Goal set today Pt Will Perform Lower Body Dressing: with modified independence;Sit to stand from bed;Sit to stand from chair ADL Goal: Lower Body Dressing - Progress: Goal set today Pt Will Transfer to Toilet: with modified independence;Ambulation;3-in-1 ADL Goal: Toilet Transfer - Progress: Goal set today Pt Will Perform Toileting - Clothing Manipulation: with modified independence;Standing ADL Goal: Toileting - Clothing Manipulation - Progress: Goal set today Pt Will Perform Toileting - Hygiene: with modified independence;Sit to stand from 3-in-1/toilet ADL Goal: Toileting - Hygiene - Progress: Goal set today Pt Will Perform Tub/Shower Transfer: Tub transfer;Ambulation;with DME ADL Goal: Tub/Shower Transfer - Progress: Goal set today  Visit Information  Last OT Received On: 08/17/12 Assistance Needed: +1    Subjective Data      Prior Functioning     Home Living Lives With: Spouse Available Help at Discharge: Family;Neighbor;Available 24 hours/day Type of Home: House Home Access: Stairs to enter [employer] of Steps: 3 Entrance Stairs-Rails: None Home Layout: Two level;Able to live on main level with bedroom/bathroom Bathroom Shower/Tub: Engineer, manufacturing systems: Standard Bathroom Accessibility:  Yes How Accessible: Accessible via walker Home Adaptive Equipment: Bedside commode/3-in-1;Walker - rolling;Shower chair with back Prior Function  Level of Independence: Independent Able to Take Stairs?: Yes Driving: Yes Vocation: Retired Musician: No difficulties         Vision/Perception     Copywriter, advertising Overall Cognitive Status: Appears within functional limits for tasks assessed/performed Arousal/Alertness: Awake/alert Orientation Level: Appears intact for tasks assessed Behavior During Session: [facility] for tasks performed    Extremity/Trunk Assessment Right Upper Extremity Assessment RUE ROM/Strength/Tone: [facility] for tasks assessed Left Upper Extremity Assessment LUE ROM/Strength/Tone: WFL for tasks assessed Trunk Assessment Trunk Assessment: Normal     Mobility Bed Mobility Bed Mobility: Not assessed Transfers Transfers: Sit to Stand;Stand to Sit Sit to Stand: 4: Min guard;With upper extremity assist;From chair/3-in-1 Stand to Sit: 4: Min guard;With upper extremity assist;To chair/3-in-1     Exercise     Balance     End of Session OT - End of Session Activity Tolerance: Patient tolerated treatment well Patient left: in chair;with call bell/phone within reach Nurse Communication: Mobility status  GO     Earlie Raveling OTR/L [phone] 08/17/2012, 9:52 AM

## 2012-08-18 LAB — CBC
HCT: 22.3 % — ABNORMAL LOW (ref 36.0–46.0)
Hemoglobin: 7.9 g/dL — ABNORMAL LOW (ref 12.0–15.0)
MCH: 30.7 pg (ref 26.0–34.0)
MCHC: 35.4 g/dL (ref 30.0–36.0)
MCV: 86.8 fL (ref 78.0–100.0)
Platelets: 180 10*3/uL (ref 150–400)
RBC: 2.57 MIL/uL — ABNORMAL LOW (ref 3.87–5.11)
RDW: 16.1 % — ABNORMAL HIGH (ref 11.5–15.5)
WBC: 9.6 10*3/uL (ref 4.0–10.5)

## 2012-08-18 LAB — GLUCOSE, CAPILLARY
Glucose-Capillary: 155 mg/dL — ABNORMAL HIGH (ref 70–99)
Glucose-Capillary: 126 mg/dL — ABNORMAL HIGH (ref 70–99)
Glucose-Capillary: 116 mg/dL — ABNORMAL HIGH (ref 70–99)
Glucose-Capillary: 132 mg/dL — ABNORMAL HIGH (ref 70–99)

## 2012-08-18 LAB — PREPARE RBC (CROSSMATCH)

## 2012-08-18 MED ORDER — HYDROMORPHONE HCL 2 MG PO TABS: 2.0000 mg | ORAL_TABLET | ORAL | Status: DC | PRN

## 2012-08-18 MED ORDER — HYDROMORPHONE HCL 2 MG PO TABS
2.0000 mg | ORAL_TABLET | ORAL | Status: DC | PRN
  Administered 2012-08-18: 4 mg via ORAL
  Administered 2012-08-18 – 2012-08-19 (×3): 2 mg via ORAL
  Filled 2012-08-18: qty 2
  Filled 2012-08-18 (×3): qty 1

## 2012-08-18 MED ORDER — ASPIRIN 325 MG PO TBEC: 325.0000 mg | DELAYED_RELEASE_TABLET | Freq: Every day | ORAL | Status: DC

## 2012-08-18 MED ORDER — METHOCARBAMOL 500 MG PO TABS: 500.0000 mg | ORAL_TABLET | Freq: Four times a day (QID) | ORAL | Status: DC | PRN

## 2012-08-18 NOTE — Progress Notes (Signed)
PATIENT ID: [name]  MRN: [id number]  DOB/AGE:  01-17-1946 / 67 y.o.  2 Days Post-Op Procedure(s) (LRB): TOTAL KNEE ARTHROPLASTY (Right)    PROGRESS NOTE Subjective: Patient is alert, oriented, no Nausea, no Vomiting, yes passing gas, no Bowel Movement. Taking PO well. Denies SOB, Chest or Calf Pain. Using Incentive Spirometer, PAS in place. Ambulating slowly with PT. Patient reports pain as moderate  .    Objective: Vital signs in last 24 hours: Filed Vitals:   08/17/12 1424 08/17/12 2152 08/17/12 2300 08/18/12 0538  BP: 164/74 194/77 180/74 138/60  Pulse: 84 95  96  Temp: 98.9 F (37.2 C) 98.9 F (37.2 C)  99.3 F (37.4 C)  TempSrc:      Resp: 18 18  18   SpO2: 99% 99%  99%      Intake/Output from previous day: I/O last 3 completed shifts: In: 2220 [P.O.:720; I.V.:1500] Out: 350 [Urine:350]   Intake/Output this shift:     LABORATORY DATA:  Recent Labs  08/17/12 0625  08/17/12 1606 08/17/12 2217 08/18/12 0534 08/18/12 0647  WBC 7.1  --   --   --  9.6  --   HGB 8.8*  --   --   --  7.9*  --   HCT 25.0*  --   --   --  22.3*  --   PLT 181  --   --   --  180  --   NA 134*  --   --   --   --   --   K 4.2  --   --   --   --   --   CL 96  --   --   --   --   --   CO2 28  --   --   --   --   --   BUN 11  --   --   --   --   --   CREATININE 0.66  --   --   --   --   --   GLUCOSE 196*  --   --   --   --   --   GLUCAP  --   < > 164* 141*  --  155*  CALCIUM 9.0  --   --   --   --   --   < > = values in this interval not displayed.  Examination: Neurologically intact ABD soft Neurovascular intact Sensation intact distally Intact pulses distally Dorsiflexion/Plantar flexion intact Incision: scant drainage}  Assessment:   2 Days Post-Op Procedure(s) (LRB): TOTAL KNEE ARTHROPLASTY (Right) ADDITIONAL DIAGNOSIS:  Acute Blood Loss Anemia  Plan: PT/OT WBAT, CPM 5/hrs day until ROM 0-90 degrees, then D/C CPM DVT Prophylaxis:  SCDx72hrs, ASA 325 mg BID x 2  weeks Dressing change today. Transfuse 1 unit PRBC. D/C oxycodone. PO Dilaudid prn pain. DISCHARGE PLAN: Home DISCHARGE NEEDS: HHPT, HHRN, CPM, Walker and 3-in-1 comode seat     Micky Sheller M. 08/18/2012, 8:01 AM

## 2012-08-18 NOTE — Progress Notes (Signed)
Physical Therapy Treatment Patient Details Name: [name] MRN: [id number] DOB: 1946-03-17 Today's Date: 08/18/2012 Time: 0940-1005 PT Time Calculation (min): 25 min  PT Assessment / Plan / Recommendation Comments on Treatment Session  Co-tx with OT due to pt being fatigued and getting blood during treatment.  Pt tolerated well and was supervision with mobility and gait.  Required steady assist for tub bench transfer which pt reports he husband can assist with at home    Follow Up Recommendations        Does the patient have the potential to tolerate intense rehabilitation     Barriers to Discharge        Equipment Recommendations       Recommendations for Other Services    Frequency 7X/week   Plan Discharge plan remains appropriate;Frequency remains appropriate    Precautions / Restrictions Precautions Precautions: Knee Restrictions Weight Bearing Restrictions: Yes RLE Weight Bearing: Weight bearing as tolerated   Pertinent Vitals/Pain Pt c/o 7/10 pain, RN aware.    Mobility  Bed Mobility Bed Mobility: Not assessed Transfers Sit to Stand: 5: Supervision Stand to Sit: 5: Supervision Stand Pivot Transfers: 4: Min guard Details for Transfer Assistance: Pt performed SPT with RW to chair in tub, required steadying assist for safety with sitting on tub chair, assitance to lift R LE into tub Ambulation/Gait Ambulation/Gait Assistance: 5: Supervision Ambulation Distance (Feet): 65 Feet Assistive device: Rolling walker Ambulation/Gait Assistance Details: improved activity tolerance today Gait Pattern: Step-to pattern;Decreased stance time - right    Exercises     PT Diagnosis:    PT Problem List:   PT Treatment Interventions:     PT Goals Acute Rehab PT Goals PT Goal: Sit to Stand - Progress: Met PT Goal: Ambulate - Progress: Met  Visit Information  Last PT Received On: 08/18/12 Assistance Needed: +1    Subjective Data  Subjective: I'll try    Cognition  Cognition Arousal/Alertness: Awake/alert Behavior During Therapy: WFL for tasks assessed/performed Overall Cognitive Status: Within Functional Limits for tasks assessed    Balance     End of Session PT - End of Session Equipment Utilized During Treatment: Gait belt Patient left: in chair;with call bell/phone within reach   GP     [facility] 08/18/2012, 12:18 PM

## 2012-08-18 NOTE — Progress Notes (Signed)
Occupational Therapy Treatment Patient Details Name: [name] MRN: [id number] DOB: May 31, 1945 Today's Date: 08/18/2012 Time: 7829-5621 OT Time Calculation (min): 30 min  OT Assessment / Plan / Recommendation Comments on Treatment Session Pt progressing towards goals. Pt at Min A level for LB ADLs and tub transfer (to swing leg in).  Practiced with AE today and recommended pt get this to assist with LB ADLs. Husband and neighbors available to assist at home.    Follow Up Recommendations  No OT follow up;Supervision/Assistance - 24 hour    Barriers to Discharge       Equipment Recommendations  3 in 1 bedside comode    Recommendations for Other Services    Frequency Min 2X/week   Plan Discharge plan remains appropriate    Precautions / Restrictions Precautions Precautions: Knee Restrictions Weight Bearing Restrictions: Yes RLE Weight Bearing: Weight bearing as tolerated   Pertinent Vitals/Pain Pain 7/10 in knee. Nursing aware. Repositioned.     ADL  Lower Body Bathing: Minimal assistance Where Assessed - Lower Body Bathing: Supported sit to stand Lower Body Dressing: Minimal assistance Where Assessed - Lower Body Dressing: Supported sit to stand Toilet Transfer: Supervision/safety Statistician Method: Sit to Barista: Raised toilet seat with arms (or 3-in-1 over toilet) Toileting - Clothing Manipulation and Hygiene: Supervision/safety Where Assessed - Engineer, mining and Hygiene: Sit to stand from 3-in-1 or toilet Tub/Shower Transfer: Minimal assistance Tub/Shower Transfer Method: Stand pivot Tub/Shower Transfer Equipment: Shower seat with back Equipment Used: Rolling walker;Sock aid;Reacher ADL Comments: Practiced LB dressing with AE. Pt required Min A. Practiced tub transfer and at Min A level to assist LE into tub.    OT Diagnosis:    OT Problem List:   OT Treatment Interventions:     OT Goals Acute Rehab OT  Goals OT Goal Formulation: With patient Time For Goal Achievement: 08/24/12 Potential to Achieve Goals: Good ADL Goals Pt Will Perform Lower Body Bathing: with modified independence;Sit to stand from chair ADL Goal: Lower Body Bathing - Progress: Progressing toward goals Pt Will Perform Lower Body Dressing: with modified independence;Sit to stand from bed;Sit to stand from chair ADL Goal: Lower Body Dressing - Progress: Progressing toward goals Pt Will Transfer to Toilet: with modified independence;Ambulation;3-in-1 ADL Goal: Toilet Transfer - Progress: Progressing toward goals Pt Will Perform Toileting - Clothing Manipulation: with modified independence;Standing ADL Goal: Toileting - Clothing Manipulation - Progress: Progressing toward goals Pt Will Perform Toileting - Hygiene: with modified independence;Sit to stand from 3-in-1/toilet ADL Goal: Toileting - Hygiene - Progress: Progressing toward goals Pt Will Perform Tub/Shower Transfer: Tub transfer;Ambulation;with DME;with modified independence ADL Goal: Tub/Shower Transfer - Progress: Progressing toward goals  Visit Information  Last OT Received On: 08/18/12 Assistance Needed: +1    Subjective Data      Prior Functioning       Cognition  Cognition Arousal/Alertness: Awake/alert Behavior During Therapy: WFL for tasks assessed/performed Overall Cognitive Status: Within Functional Limits for tasks assessed    Mobility  Bed Mobility Bed Mobility: Not assessed Transfers Transfers: Sit to Stand;Stand to Sit Sit to Stand: 5: Supervision Stand to Sit: 5: Supervision Details for Transfer Assistance: Pt performed SPT with RW to chair in tub, required steadying assist for safety with sitting on tub chair, assitance to lift R LE into tub    Exercises      Balance     End of Session OT - End of Session Activity Tolerance: Patient tolerated treatment well Patient left:  in chair;with call bell/phone within reach  [employer]  OTR/L [phone]  08/18/2012, 2:49 PM

## 2012-08-19 LAB — CBC
HCT: 22.7 % — ABNORMAL LOW (ref 36.0–46.0)
Hemoglobin: 8.2 g/dL — ABNORMAL LOW (ref 12.0–15.0)
MCH: 30.9 pg (ref 26.0–34.0)
MCHC: 36.1 g/dL — ABNORMAL HIGH (ref 30.0–36.0)
MCV: 85.7 fL (ref 78.0–100.0)
Platelets: 175 10*3/uL (ref 150–400)
RBC: 2.65 MIL/uL — ABNORMAL LOW (ref 3.87–5.11)
RDW: 15.3 % (ref 11.5–15.5)
WBC: 10.5 10*3/uL (ref 4.0–10.5)

## 2012-08-19 LAB — TYPE AND SCREEN
ABO/RH(D): O POS
Antibody Screen: NEGATIVE
Unit division: 0

## 2012-08-19 LAB — GLUCOSE, CAPILLARY
Glucose-Capillary: 115 mg/dL — ABNORMAL HIGH (ref 70–99)
Glucose-Capillary: 131 mg/dL — ABNORMAL HIGH (ref 70–99)

## 2012-08-19 MED ORDER — ONDANSETRON HCL 4 MG PO TABS: 4.0000 mg | ORAL_TABLET | Freq: Four times a day (QID) | ORAL | Status: DC | PRN

## 2012-08-19 NOTE — Discharge Summary (Signed)
Patient ID: [name] MRN: [id number] DOB/AGE: [birth year] 65 y.o.  Admit date: 08/16/2012 Discharge date: 08/19/2012  Admission Diagnoses:  Principal Problem:   Arthritis of knee, right   Discharge Diagnoses:  Same  Past Medical History  Diagnosis Date  . Allergy   . Hypertension   . Hyperlipidemia   . Fatty liver disease, nonalcoholic   . History of hyperkalemia   . Obesity   . Renal lesion   . Hx antineoplastic chemo  10/12 - 06/24/11    PACLITAX EL COMPLETED 06/24/11  . Anemia   . Diabetes mellitus ORAL MED  . OA (osteoarthritis) of knee     RIGHT LEG  . Mild nonproliferative diabetic retinopathy of right eye 03/19/11    Dr. Norva Pavlov  . Breast CA     (Rt) breast ca dx 4/12---  S/P RADICAL MASTECTOMY AND CHEMORADIATION  . Numbness and tingling     Hx: of in fingers and toes since chemotherapy    Surgeries: Procedure(s): TOTAL KNEE ARTHROPLASTY on 08/16/2012   Consultants:    Discharged Condition: Improved  Hospital Course: [name] is an 67 y.o. female who was admitted 08/16/2012 for operative treatment ofArthritis of knee, right. Patient has severe unremitting pain that affects sleep, daily activities, and work/hobbies. After pre-op clearance the patient was taken to the operating room on 08/16/2012 and underwent  Procedure(s): TOTAL KNEE ARTHROPLASTY.    Patient was given perioperative antibiotics: Anti-infectives   Start     Dose/Rate Route Frequency Ordered Stop   08/16/12 0830  cefUROXime (ZINACEF) injection  Status:  Discontinued       As needed 08/16/12 0831 08/16/12 0919   08/16/12 0600  ceFAZolin (ANCEF) IVPB 2 g/50 mL premix     2 g 100 mL/hr over 30 Minutes Intravenous On call to O.R. 08/15/12 1329 08/16/12 0723       Patient was given sequential compression devices, early ambulation, and chemoprophylaxis to prevent DVT.  Patient benefited maximally from hospital stay and there were no complications.    Recent vital signs:  Patient Vitals for the past 24 hrs:  BP Temp Temp src Pulse Resp SpO2  08/19/12 0545 125/51 mmHg 98.2 F (36.8 C) - 91 16 100 %  08/18/12 2144 167/74 mmHg 99.2 F (37.3 C) - 97 18 100 %  08/18/12 1145 178/74 mmHg 99.3 F (37.4 C) Oral 93 18 -  08/18/12 1105 188/67 mmHg 99 F (37.2 C) Oral 86 18 -  08/18/12 1005 159/77 mmHg 98 F (36.7 C) Oral 98 18 -     Recent laboratory studies:  Recent Labs  08/17/12 0625 08/18/12 0534 08/19/12 0431  WBC 7.1 9.6 10.5  HGB 8.8* 7.9* 8.2*  HCT 25.0* 22.3* 22.7*  PLT 181 180 175  NA 134*  --   --   K 4.2  --   --   CL 96  --   --   CO2 28  --   --   BUN 11  --   --   CREATININE 0.66  --   --   GLUCOSE 196*  --   --   CALCIUM 9.0  --   --      Discharge Medications:     Medication List    STOP taking these medications       aspirin 81 MG tablet      TAKE these medications       anastrozole 1 MG tablet  Commonly known as:  ARIMIDEX  Take 1 mg by mouth at bedtime.     aspirin 325 MG EC tablet  Take 1 tablet (325 mg total) by mouth daily with breakfast.     diphenhydrAMINE 25 mg capsule  Commonly known as:  BENADRYL  Take 1 capsule (25 mg total) by mouth every 4 (four) hours as needed for itching.     glipiZIDE 10 MG tablet  Commonly known as:  GLUCOTROL  Take 1 tablet (10 mg total) by mouth 2 (two) times daily before a meal.     hydrochlorothiazide 25 MG tablet  Commonly known as:  HYDRODIURIL  Take 1 tablet (25 mg total) by mouth daily.     HYDROmorphone 2 MG tablet  Commonly known as:  DILAUDID  Take 1-2 tablets (2-4 mg total) by mouth every 4 (four) hours as needed.     lisinopril 40 MG tablet  Commonly known as:  PRINIVIL,ZESTRIL  Take 1 tablet (40 mg total) by mouth daily.     metFORMIN 500 MG tablet  Commonly known as:  GLUCOPHAGE  Take 1 tablet (500 mg total) by mouth 2 (two) times daily with a meal.     methocarbamol 500 MG tablet  Commonly known as:  ROBAXIN  Take 1 tablet (500 mg total) by mouth  every 6 (six) hours as needed.     metoprolol succinate 25 MG 24 hr tablet  Commonly known as:  TOPROL XL  Take 1 tablet (25 mg total) by mouth daily.     ondansetron 4 MG tablet  Commonly known as:  ZOFRAN  Take 1 tablet (4 mg total) by mouth every 6 (six) hours as needed for nausea.     pravastatin 40 MG tablet  Commonly known as:  PRAVACHOL  Take 1 tablet (40 mg total) by mouth at bedtime.     REFRESH OP  Place 1 drop into both eyes daily as needed. Dry eyes        Diagnostic Studies: Dg Chest 2 View  08/09/2012  *RADIOLOGY REPORT*  Clinical Data: Preop for right knee replacement  CHEST - 2 VIEW  Comparison: 06/14/2012  Findings: Cardiomediastinal silhouette is stable.  Surgical clips in the right axilla.  No acute infiltrate or pleural effusion.  No pulmonary edema.  Degenerative changes thoracic spine.  IMPRESSION: No active disease.  Degenerative changes thoracic spine.   Original Report Authenticated By: Natasha Mead, M.D.     Disposition: 01-Home or Self Care      Discharge Orders   Future Appointments Provider Department Dept Phone   11/29/2012 9:00 AM Delcie Roch [facility] MEDICAL ONCOLOGY [phone]   12/06/2012 9:00 AM Lowella Dell, MD [facility] MEDICAL ONCOLOGY [phone]   Future Orders Complete By Expires     Call MD for:  redness, tenderness, or signs of infection (pain, swelling, redness, odor or green/yellow discharge around incision site)  As directed     Call MD for:  severe uncontrolled pain  As directed     Call MD for:  temperature >100.4  As directed     Change dressing (specify)  As directed     Comments:      Dressing change as needed.    Discharge instructions  As directed     Comments:      F/U with Dr. Turner Daniels as scheduled (POD #14)    Driving Restrictions  As directed     Comments:      No driving for 2 weeks.  Increase activity slowly  As directed     May shower / Bathe  As directed     Walker   As  directed           Signed: Angila Wombles M. 08/19/2012, 9:37 AM

## 2012-08-19 NOTE — Progress Notes (Signed)
PATIENT ID: [name]  MRN: [id number]  DOB/AGE:  06/23/1945 / 67 y.o.  3 Days Post-Op Procedure(s) (LRB): TOTAL KNEE ARTHROPLASTY (Right)    PROGRESS NOTE Subjective: Patient is alert, oriented, no Nausea, no Vomiting, yes passing gas, no Bowel Movement. Taking PO well - nausea with pain meds. Denies SOB, Chest or Calf Pain. Using Incentive Spirometer, PAS in place. Ambulating well with PT. Patient reports pain as moderate  .    Objective: Vital signs in last 24 hours: Filed Vitals:   08/18/12 1105 08/18/12 1145 08/18/12 2144 08/19/12 0545  BP: 188/67 178/74 167/74 125/51  Pulse: 86 93 97 91  Temp: 99 F (37.2 C) 99.3 F (37.4 C) 99.2 F (37.3 C) 98.2 F (36.8 C)  TempSrc: Oral Oral    Resp: 18 18 18 16   SpO2:   100% 100%      Intake/Output from previous day: I/O last 3 completed shifts: In: 120 [P.O.:120] Out: -    Intake/Output this shift:     LABORATORY DATA:  Recent Labs  08/17/12 0625  08/18/12 0534  08/18/12 2302 08/19/12 0111 08/19/12 0431 08/19/12 0641  WBC 7.1  --  9.6  --   --   --  10.5  --   HGB 8.8*  --  7.9*  --   --   --  8.2*  --   HCT 25.0*  --  22.3*  --   --   --  22.7*  --   PLT 181  --  180  --   --   --  175  --   NA 134*  --   --   --   --   --   --   --   K 4.2  --   --   --   --   --   --   --   CL 96  --   --   --   --   --   --   --   CO2 28  --   --   --   --   --   --   --   BUN 11  --   --   --   --   --   --   --   CREATININE 0.66  --   --   --   --   --   --   --   GLUCOSE 196*  --   --   --   --   --   --   --   GLUCAP  --   < >  --   < > 132* 115*  --  131*  CALCIUM 9.0  --   --   --   --   --   --   --   < > = values in this interval not displayed.  Examination: Neurologically intact ABD soft Neurovascular intact Sensation intact distally Intact pulses distally Dorsiflexion/Plantar flexion intact Incision: dressing C/D/I}  Assessment:   3 Days Post-Op Procedure(s) (LRB): TOTAL KNEE ARTHROPLASTY  (Right) ADDITIONAL DIAGNOSIS:  Acute Blood Loss Anemia s/p 1 unit PRBC  Plan: PT/OT WBAT, CPM 5/hrs day until ROM 0-90 degrees, then D/C CPM DVT Prophylaxis:  SCDx72hrs, ASA 325 mg BID x 2 weeks DISCHARGE PLAN: Home today DISCHARGE NEEDS: HHPT, HHRN, CPM, Walker and 3-in-1 comode seat     Kameryn Tisdel M. 08/19/2012, 9:34 AM

## 2012-08-19 NOTE — Progress Notes (Signed)
Physical Therapy Treatment Patient Details Name: [name] MRN: [id number] DOB: 06-Jul-1945 Today's Date: 08/19/2012 Time: 4098-1191 PT Time Calculation (min): 19 min  PT Assessment / Plan / Recommendation Comments on Treatment Session  Pt progressing towards goals slowly; limited this morning due to nausea. Deffered to attempt stairs; will attempt in afternoon session to ensure safe D/C. Once stair amb has been addressed, pt will be clear from PT standpoint for D/C home with HHPT.     Follow Up Recommendations  Home health PT;Supervision/Assistance - 24 hour     Does the patient have the potential to tolerate intense rehabilitation     Barriers to Discharge        Equipment Recommendations  Rolling walker with 5" wheels    Recommendations for Other Services    Frequency 7X/week   Plan Discharge plan remains appropriate;Frequency remains appropriate    Precautions / Restrictions Precautions Precautions: Knee Restrictions Weight Bearing Restrictions: Yes RLE Weight Bearing: Weight bearing as tolerated   Pertinent Vitals/Pain 2/10 at end of session; pt repositioned in bed; premedicated. C/o nausea; RN notified.     Mobility  Bed Mobility Bed Mobility: Supine to Sit;Sitting - Scoot to Edge of Bed Supine to Sit: 4: Min assist;HOB elevated;With rails Sitting - Scoot to Edge of Bed: 4: Min guard;With rail Details for Bed Mobility Assistance: vc's for hand placement; required (A) to adavance trunk to upright sitting position secondary to pain and nausea  Transfers Transfers: Sit to Stand;Stand to Sit Sit to Stand: 5: Supervision;From bed Stand to Sit: 5: Supervision;With armrests;To chair/3-in-1 Details for Transfer Assistance: Pt required vc's for hand placement and safety with RW; correctable with cues; tends to pull up from RW for sit to stand transfer Ambulation/Gait Ambulation/Gait Assistance: 5: Supervision Ambulation Distance (Feet): 80 Feet Assistive device:  Rolling walker Ambulation/Gait Assistance Details: c/o nausea and pain 2/10 with amb; vc's for upright posture and gt sequencing. pt progressed to step trhough gt pattern with decreased step length on L LE Gait Pattern: Step-through pattern;Step-to pattern;Decreased stride length;Trunk flexed;Wide base of support (Bil LEs ER) Gait velocity: decreased Stairs: No Wheelchair Mobility Wheelchair Mobility: No    Exercises Total Joint Exercises Ankle Circles/Pumps: AROM;10 reps;Both Quad Sets: AROM;Right;10 reps Hip ABduction/ADduction: AAROM;Right;10 reps;Seated Long Arc Quad: AAROM;Right;10 reps   PT Diagnosis:    PT Problem List:   PT Treatment Interventions:     PT Goals Acute Rehab PT Goals PT Goal Formulation: With patient Time For Goal Achievement: 08/23/12 Potential to Achieve Goals: Good PT Goal: Supine/Side to Sit - Progress: Progressing toward goal PT Goal: Sit to Stand - Progress: Progressing toward goal PT Goal: Ambulate - Progress: Progressing toward goal PT Goal: Perform Home Exercise Program - Progress: Progressing toward goal  Visit Information  Last PT Received On: 08/19/12 Assistance Needed: +1    Subjective Data  Subjective: i guess we can walk. i am nauseous  Patient Stated Goal: home with friend    Cognition  Cognition Arousal/Alertness: Awake/alert Behavior During Therapy: WFL for tasks assessed/performed Overall Cognitive Status: Within Functional Limits for tasks assessed    Balance  Balance Balance Assessed: No  End of Session PT - End of Session Equipment Utilized During Treatment: Gait belt Activity Tolerance: Other (comment) (deferred stair amb secondary to nausea ) Patient left: in chair;with call bell/phone within reach Nurse Communication: Mobility status;Other (comment) (need for nausea meds)   GP     Donell Sievert, [name] [phone] 08/19/2012, 10:11 AM

## 2012-08-21 DIAGNOSIS — I10 Essential (primary) hypertension: Secondary | ICD-10-CM | POA: Diagnosis not present

## 2012-08-21 DIAGNOSIS — Z4801 Encounter for change or removal of surgical wound dressing: Secondary | ICD-10-CM | POA: Diagnosis not present

## 2012-08-21 DIAGNOSIS — Z96659 Presence of unspecified artificial knee joint: Secondary | ICD-10-CM | POA: Diagnosis not present

## 2012-08-21 DIAGNOSIS — E119 Type 2 diabetes mellitus without complications: Secondary | ICD-10-CM | POA: Diagnosis not present

## 2012-08-21 DIAGNOSIS — Z471 Aftercare following joint replacement surgery: Secondary | ICD-10-CM | POA: Diagnosis not present

## 2012-08-22 DIAGNOSIS — Z96659 Presence of unspecified artificial knee joint: Secondary | ICD-10-CM | POA: Diagnosis not present

## 2012-08-22 DIAGNOSIS — Z4801 Encounter for change or removal of surgical wound dressing: Secondary | ICD-10-CM | POA: Diagnosis not present

## 2012-08-22 DIAGNOSIS — Z471 Aftercare following joint replacement surgery: Secondary | ICD-10-CM | POA: Diagnosis not present

## 2012-08-22 DIAGNOSIS — I10 Essential (primary) hypertension: Secondary | ICD-10-CM | POA: Diagnosis not present

## 2012-08-22 DIAGNOSIS — E119 Type 2 diabetes mellitus without complications: Secondary | ICD-10-CM | POA: Diagnosis not present

## 2012-08-23 DIAGNOSIS — I10 Essential (primary) hypertension: Secondary | ICD-10-CM | POA: Diagnosis not present

## 2012-08-23 DIAGNOSIS — Z4801 Encounter for change or removal of surgical wound dressing: Secondary | ICD-10-CM | POA: Diagnosis not present

## 2012-08-23 DIAGNOSIS — E119 Type 2 diabetes mellitus without complications: Secondary | ICD-10-CM | POA: Diagnosis not present

## 2012-08-23 DIAGNOSIS — Z96659 Presence of unspecified artificial knee joint: Secondary | ICD-10-CM | POA: Diagnosis not present

## 2012-08-23 DIAGNOSIS — Z471 Aftercare following joint replacement surgery: Secondary | ICD-10-CM | POA: Diagnosis not present

## 2012-08-24 DIAGNOSIS — E119 Type 2 diabetes mellitus without complications: Secondary | ICD-10-CM | POA: Diagnosis not present

## 2012-08-24 DIAGNOSIS — Z96659 Presence of unspecified artificial knee joint: Secondary | ICD-10-CM | POA: Diagnosis not present

## 2012-08-24 DIAGNOSIS — Z471 Aftercare following joint replacement surgery: Secondary | ICD-10-CM | POA: Diagnosis not present

## 2012-08-24 DIAGNOSIS — I10 Essential (primary) hypertension: Secondary | ICD-10-CM | POA: Diagnosis not present

## 2012-08-24 DIAGNOSIS — Z4801 Encounter for change or removal of surgical wound dressing: Secondary | ICD-10-CM | POA: Diagnosis not present

## 2012-08-25 DIAGNOSIS — I10 Essential (primary) hypertension: Secondary | ICD-10-CM | POA: Diagnosis not present

## 2012-08-25 DIAGNOSIS — Z471 Aftercare following joint replacement surgery: Secondary | ICD-10-CM | POA: Diagnosis not present

## 2012-08-25 DIAGNOSIS — Z4801 Encounter for change or removal of surgical wound dressing: Secondary | ICD-10-CM | POA: Diagnosis not present

## 2012-08-25 DIAGNOSIS — Z96659 Presence of unspecified artificial knee joint: Secondary | ICD-10-CM | POA: Diagnosis not present

## 2012-08-25 DIAGNOSIS — E119 Type 2 diabetes mellitus without complications: Secondary | ICD-10-CM | POA: Diagnosis not present

## 2012-08-26 DIAGNOSIS — Z96659 Presence of unspecified artificial knee joint: Secondary | ICD-10-CM | POA: Diagnosis not present

## 2012-08-26 DIAGNOSIS — Z4801 Encounter for change or removal of surgical wound dressing: Secondary | ICD-10-CM | POA: Diagnosis not present

## 2012-08-26 DIAGNOSIS — Z471 Aftercare following joint replacement surgery: Secondary | ICD-10-CM | POA: Diagnosis not present

## 2012-08-26 DIAGNOSIS — E119 Type 2 diabetes mellitus without complications: Secondary | ICD-10-CM | POA: Diagnosis not present

## 2012-08-26 DIAGNOSIS — I10 Essential (primary) hypertension: Secondary | ICD-10-CM | POA: Diagnosis not present

## 2012-08-27 DIAGNOSIS — Z471 Aftercare following joint replacement surgery: Secondary | ICD-10-CM | POA: Diagnosis not present

## 2012-08-27 DIAGNOSIS — Z96659 Presence of unspecified artificial knee joint: Secondary | ICD-10-CM | POA: Diagnosis not present

## 2012-08-27 DIAGNOSIS — Z4801 Encounter for change or removal of surgical wound dressing: Secondary | ICD-10-CM | POA: Diagnosis not present

## 2012-08-27 DIAGNOSIS — E119 Type 2 diabetes mellitus without complications: Secondary | ICD-10-CM | POA: Diagnosis not present

## 2012-08-27 DIAGNOSIS — I10 Essential (primary) hypertension: Secondary | ICD-10-CM | POA: Diagnosis not present

## 2012-08-30 DIAGNOSIS — I10 Essential (primary) hypertension: Secondary | ICD-10-CM | POA: Diagnosis not present

## 2012-08-30 DIAGNOSIS — Z471 Aftercare following joint replacement surgery: Secondary | ICD-10-CM | POA: Diagnosis not present

## 2012-08-30 DIAGNOSIS — Z4801 Encounter for change or removal of surgical wound dressing: Secondary | ICD-10-CM | POA: Diagnosis not present

## 2012-08-30 DIAGNOSIS — E119 Type 2 diabetes mellitus without complications: Secondary | ICD-10-CM | POA: Diagnosis not present

## 2012-08-30 DIAGNOSIS — Z96659 Presence of unspecified artificial knee joint: Secondary | ICD-10-CM | POA: Diagnosis not present

## 2012-08-30 NOTE — Progress Notes (Signed)
Late Entry: PT is recommending home with HH and not SNF. Clinical Social Worker will sign off for now as social work intervention is no longer needed. Please consult us again if new need arises.   Dhruti Ghuman, MSW [phone] 

## 2012-08-31 ENCOUNTER — Other Ambulatory Visit: Payer: Self-pay | Admitting: Orthopedic Surgery

## 2012-08-31 ENCOUNTER — Inpatient Hospital Stay (HOSPITAL_COMMUNITY)
Admission: AD | Admit: 2012-08-31 | Discharge: 2012-09-08 | DRG: 485 | Disposition: A | Discharge status: Skilled Nursing Facility | Payer: Medicare Other | Source: Ambulatory Visit | Attending: Orthopedic Surgery | Admitting: Orthopedic Surgery

## 2012-08-31 DIAGNOSIS — T8459XD Infection and inflammatory reaction due to other internal joint prosthesis, subsequent encounter: Secondary | ICD-10-CM

## 2012-08-31 DIAGNOSIS — T8450XA Infection and inflammatory reaction due to unspecified internal joint prosthesis, initial encounter: Secondary | ICD-10-CM | POA: Diagnosis not present

## 2012-08-31 DIAGNOSIS — E1129 Type 2 diabetes mellitus with other diabetic kidney complication: Secondary | ICD-10-CM | POA: Diagnosis present

## 2012-08-31 DIAGNOSIS — G909 Disorder of the autonomic nervous system, unspecified: Secondary | ICD-10-CM | POA: Diagnosis not present

## 2012-08-31 DIAGNOSIS — Z96659 Presence of unspecified artificial knee joint: Secondary | ICD-10-CM | POA: Diagnosis not present

## 2012-08-31 DIAGNOSIS — M6281 Muscle weakness (generalized): Secondary | ICD-10-CM | POA: Diagnosis not present

## 2012-08-31 DIAGNOSIS — Z452 Encounter for adjustment and management of vascular access device: Secondary | ICD-10-CM | POA: Diagnosis not present

## 2012-08-31 DIAGNOSIS — D62 Acute posthemorrhagic anemia: Secondary | ICD-10-CM | POA: Diagnosis not present

## 2012-08-31 DIAGNOSIS — M7120 Synovial cyst of popliteal space [Baker], unspecified knee: Secondary | ICD-10-CM | POA: Diagnosis not present

## 2012-08-31 DIAGNOSIS — E1165 Type 2 diabetes mellitus with hyperglycemia: Secondary | ICD-10-CM | POA: Diagnosis present

## 2012-08-31 DIAGNOSIS — N179 Acute kidney failure, unspecified: Secondary | ICD-10-CM | POA: Diagnosis not present

## 2012-08-31 DIAGNOSIS — E11319 Type 2 diabetes mellitus with unspecified diabetic retinopathy without macular edema: Secondary | ICD-10-CM | POA: Diagnosis present

## 2012-08-31 DIAGNOSIS — E669 Obesity, unspecified: Secondary | ICD-10-CM | POA: Diagnosis present

## 2012-08-31 DIAGNOSIS — I10 Essential (primary) hypertension: Secondary | ICD-10-CM | POA: Diagnosis present

## 2012-08-31 DIAGNOSIS — T847XXA Infection and inflammatory reaction due to other internal orthopedic prosthetic devices, implants and grafts, initial encounter: Secondary | ICD-10-CM | POA: Diagnosis not present

## 2012-08-31 DIAGNOSIS — N170 Acute kidney failure with tubular necrosis: Secondary | ICD-10-CM | POA: Diagnosis not present

## 2012-08-31 DIAGNOSIS — A4902 Methicillin resistant Staphylococcus aureus infection, unspecified site: Secondary | ICD-10-CM

## 2012-08-31 DIAGNOSIS — T8459XA Infection and inflammatory reaction due to other internal joint prosthesis, initial encounter: Secondary | ICD-10-CM

## 2012-08-31 DIAGNOSIS — Y831 Surgical operation with implant of artificial internal device as the cause of abnormal reaction of the patient, or of later complication, without mention of misadventure at the time of the procedure: Secondary | ICD-10-CM | POA: Diagnosis not present

## 2012-08-31 DIAGNOSIS — Z792 Long term (current) use of antibiotics: Secondary | ICD-10-CM | POA: Diagnosis not present

## 2012-08-31 DIAGNOSIS — Z853 Personal history of malignant neoplasm of breast: Secondary | ICD-10-CM

## 2012-08-31 DIAGNOSIS — E1139 Type 2 diabetes mellitus with other diabetic ophthalmic complication: Secondary | ICD-10-CM | POA: Diagnosis present

## 2012-08-31 DIAGNOSIS — R269 Unspecified abnormalities of gait and mobility: Secondary | ICD-10-CM | POA: Diagnosis not present

## 2012-08-31 DIAGNOSIS — E785 Hyperlipidemia, unspecified: Secondary | ICD-10-CM | POA: Diagnosis present

## 2012-08-31 DIAGNOSIS — M009 Pyogenic arthritis, unspecified: Secondary | ICD-10-CM | POA: Diagnosis present

## 2012-08-31 DIAGNOSIS — M25569 Pain in unspecified knee: Secondary | ICD-10-CM | POA: Diagnosis not present

## 2012-08-31 DIAGNOSIS — M1990 Unspecified osteoarthritis, unspecified site: Secondary | ICD-10-CM | POA: Diagnosis not present

## 2012-08-31 DIAGNOSIS — M6580 Other synovitis and tenosynovitis, unspecified site: Secondary | ICD-10-CM | POA: Diagnosis not present

## 2012-08-31 DIAGNOSIS — E119 Type 2 diabetes mellitus without complications: Secondary | ICD-10-CM | POA: Diagnosis not present

## 2012-08-31 DIAGNOSIS — D649 Anemia, unspecified: Secondary | ICD-10-CM

## 2012-08-31 DIAGNOSIS — Z5189 Encounter for other specified aftercare: Secondary | ICD-10-CM | POA: Diagnosis not present

## 2012-08-31 LAB — BASIC METABOLIC PANEL
BUN: 19 mg/dL (ref 6–23)
CO2: 29 mEq/L (ref 19–32)
Calcium: 9.9 mg/dL (ref 8.4–10.5)
Chloride: 99 mEq/L (ref 96–112)
Creatinine, Ser: 0.94 mg/dL (ref 0.50–1.10)
GFR calc Af Amer: 72 mL/min — ABNORMAL LOW (ref >90)
GFR calc non Af Amer: 62 mL/min — ABNORMAL LOW (ref >90)
Glucose, Bld: 121 mg/dL — ABNORMAL HIGH (ref 70–99)
Potassium: 3.7 mEq/L (ref 3.5–5.1)
Sodium: 138 mEq/L (ref 135–145)

## 2012-08-31 LAB — PROTIME-INR
INR: 1.1 (ref 0.00–1.49)
Prothrombin Time: 14.1 seconds (ref 11.6–15.2)

## 2012-08-31 LAB — CBC
HCT: 23.1 % — ABNORMAL LOW (ref 36.0–46.0)
Hemoglobin: 7.6 g/dL — ABNORMAL LOW (ref 12.0–15.0)
MCH: 31.1 pg (ref 26.0–34.0)
MCHC: 32.9 g/dL (ref 30.0–36.0)
MCV: 94.7 fL (ref 78.0–100.0)
Platelets: 429 10*3/uL — ABNORMAL HIGH (ref 150–400)
RBC: 2.44 MIL/uL — ABNORMAL LOW (ref 3.87–5.11)
RDW: 18.5 % — ABNORMAL HIGH (ref 11.5–15.5)
WBC: 9.1 10*3/uL (ref 4.0–10.5)

## 2012-08-31 LAB — C-REACTIVE PROTEIN: CRP: 5.1 mg/dL — ABNORMAL HIGH (ref <0.60)

## 2012-08-31 LAB — SEDIMENTATION RATE: Sed Rate: 55 mm/hr — ABNORMAL HIGH (ref 0–22)

## 2012-08-31 LAB — APTT: aPTT: 31 seconds (ref 24–37)

## 2012-08-31 MED ORDER — HYDROMORPHONE HCL PF 1 MG/ML IJ SOLN
0.5000 mg | INTRAMUSCULAR | Status: DC | PRN
  Administered 2012-09-01: 0.5 mg via INTRAVENOUS
  Filled 2012-08-31: qty 1

## 2012-08-31 MED ORDER — SIMVASTATIN 20 MG PO TABS
20.0000 mg | ORAL_TABLET | Freq: Every day | ORAL | Status: DC
  Administered 2012-08-31 – 2012-09-07 (×8): 20 mg via ORAL
  Filled 2012-08-31 (×10): qty 1

## 2012-08-31 MED ORDER — ACETAMINOPHEN 325 MG PO TABS
650.0000 mg | ORAL_TABLET | Freq: Four times a day (QID) | ORAL | Status: DC | PRN
  Administered 2012-09-04 – 2012-09-07 (×2): 650 mg via ORAL
  Filled 2012-08-31 (×2): qty 2

## 2012-08-31 MED ORDER — METOPROLOL SUCCINATE ER 25 MG PO TB24
25.0000 mg | ORAL_TABLET | Freq: Every day | ORAL | Status: DC
  Administered 2012-08-31 – 2012-09-08 (×8): 25 mg via ORAL
  Filled 2012-08-31 (×9): qty 1

## 2012-08-31 MED ORDER — CEFAZOLIN SODIUM-DEXTROSE 2-3 GM-% IV SOLR
2.0000 g | INTRAVENOUS | Status: AC
  Filled 2012-08-31: qty 50

## 2012-08-31 MED ORDER — VANCOMYCIN HCL IN DEXTROSE 1-5 GM/200ML-% IV SOLN
1000.0000 mg | Freq: Three times a day (TID) | INTRAVENOUS | Status: DC
  Administered 2012-08-31 – 2012-09-03 (×11): 1000 mg via INTRAVENOUS
  Filled 2012-08-31 (×15): qty 200

## 2012-08-31 MED ORDER — ACETAMINOPHEN 650 MG RE SUPP: 650.0000 mg | Freq: Four times a day (QID) | RECTAL | Status: DC | PRN

## 2012-08-31 MED ORDER — HYDROMORPHONE HCL PF 1 MG/ML IJ SOLN: 0.5000 mg | INTRAMUSCULAR | Status: DC | PRN

## 2012-08-31 MED ORDER — DOCUSATE SODIUM 100 MG PO CAPS: 100.0000 mg | ORAL_CAPSULE | Freq: Two times a day (BID) | ORAL | Status: DC

## 2012-08-31 MED ORDER — ANASTROZOLE 1 MG PO TABS
1.0000 mg | ORAL_TABLET | Freq: Every day | ORAL | Status: DC
  Administered 2012-08-31 – 2012-09-07 (×8): 1 mg via ORAL
  Filled 2012-08-31 (×9): qty 1

## 2012-08-31 MED ORDER — METHOCARBAMOL 500 MG PO TABS
500.0000 mg | ORAL_TABLET | Freq: Four times a day (QID) | ORAL | Status: DC | PRN
  Administered 2012-09-01 – 2012-09-02 (×2): 500 mg via ORAL
  Filled 2012-08-31 (×3): qty 1

## 2012-08-31 MED ORDER — ASPIRIN EC 325 MG PO TBEC
325.0000 mg | DELAYED_RELEASE_TABLET | Freq: Every day | ORAL | Status: DC
  Administered 2012-08-31 – 2012-09-08 (×8): 325 mg via ORAL
  Filled 2012-08-31 (×10): qty 1

## 2012-08-31 MED ORDER — DEXTROSE-NACL 5-0.45 % IV SOLN: INTRAVENOUS | Status: DC

## 2012-08-31 MED ORDER — DEXTROSE-NACL 5-0.45 % IV SOLN
INTRAVENOUS | Status: DC
  Administered 2012-09-01: via INTRAVENOUS

## 2012-08-31 MED ORDER — LISINOPRIL 40 MG PO TABS
40.0000 mg | ORAL_TABLET | Freq: Every day | ORAL | Status: DC
  Administered 2012-09-02 – 2012-09-05 (×3): 40 mg via ORAL
  Filled 2012-08-31 (×6): qty 1

## 2012-08-31 MED ORDER — DOCUSATE SODIUM 100 MG PO CAPS
100.0000 mg | ORAL_CAPSULE | Freq: Two times a day (BID) | ORAL | Status: DC
  Administered 2012-08-31 – 2012-09-07 (×14): 100 mg via ORAL
  Filled 2012-08-31 (×17): qty 1

## 2012-08-31 MED ORDER — HYDROCODONE-ACETAMINOPHEN 5-325 MG PO TABS
1.0000 | ORAL_TABLET | ORAL | Status: DC | PRN
  Administered 2012-08-31: 2 via ORAL
  Administered 2012-09-01: 1 via ORAL
  Administered 2012-09-04: 2 via ORAL
  Filled 2012-08-31 (×3): qty 2
  Filled 2012-08-31: qty 1
  Filled 2012-08-31 (×2): qty 2
  Filled 2012-08-31: qty 1

## 2012-08-31 MED ORDER — GLIPIZIDE 10 MG PO TABS
10.0000 mg | ORAL_TABLET | Freq: Two times a day (BID) | ORAL | Status: DC
  Administered 2012-09-01 – 2012-09-04 (×4): 10 mg via ORAL
  Filled 2012-08-31 (×12): qty 1

## 2012-08-31 MED ORDER — SENNA 8.6 MG PO TABS
1.0000 | ORAL_TABLET | Freq: Two times a day (BID) | ORAL | Status: DC
  Administered 2012-08-31 – 2012-09-05 (×10): 8.6 mg via ORAL
  Filled 2012-08-31 (×12): qty 1

## 2012-08-31 MED ORDER — ONDANSETRON HCL 4 MG/2ML IJ SOLN
4.0000 mg | Freq: Four times a day (QID) | INTRAMUSCULAR | Status: DC | PRN
  Administered 2012-09-04 – 2012-09-06 (×3): 4 mg via INTRAVENOUS
  Filled 2012-08-31 (×3): qty 2

## 2012-08-31 MED ORDER — DIPHENHYDRAMINE HCL 50 MG/ML IJ SOLN: 12.5000 mg | Freq: Four times a day (QID) | INTRAMUSCULAR | Status: DC | PRN

## 2012-08-31 MED ORDER — SENNA 8.6 MG PO TABS: 1.0000 | ORAL_TABLET | Freq: Two times a day (BID) | ORAL | Status: DC

## 2012-08-31 MED ORDER — HYDROCODONE-ACETAMINOPHEN 5-325 MG PO TABS
1.0000 | ORAL_TABLET | ORAL | Status: DC | PRN
  Administered 2012-09-02: 2 via ORAL
  Administered 2012-09-02: 1 via ORAL
  Administered 2012-09-03 – 2012-09-05 (×4): 2 via ORAL
  Filled 2012-08-31 (×4): qty 2

## 2012-08-31 MED ORDER — ONDANSETRON HCL 4 MG/2ML IJ SOLN: 4.0000 mg | Freq: Four times a day (QID) | INTRAMUSCULAR | Status: DC | PRN

## 2012-08-31 MED ORDER — DIPHENHYDRAMINE HCL 12.5 MG/5ML PO ELIX: 12.5000 mg | ORAL_SOLUTION | Freq: Four times a day (QID) | ORAL | Status: DC | PRN

## 2012-08-31 MED ORDER — METFORMIN HCL 500 MG PO TABS
500.0000 mg | ORAL_TABLET | Freq: Two times a day (BID) | ORAL | Status: DC
  Administered 2012-09-01 – 2012-09-04 (×4): 500 mg via ORAL
  Filled 2012-08-31 (×11): qty 1

## 2012-08-31 MED ORDER — INSULIN ASPART 100 UNIT/ML ~~LOC~~ SOLN
0.0000 [IU] | Freq: Three times a day (TID) | SUBCUTANEOUS | Status: DC
  Administered 2012-09-01 – 2012-09-07 (×5): 2 [IU] via SUBCUTANEOUS

## 2012-08-31 MED ORDER — DIPHENHYDRAMINE HCL 12.5 MG/5ML PO ELIX
12.5000 mg | ORAL_SOLUTION | Freq: Four times a day (QID) | ORAL | Status: DC | PRN
  Administered 2012-09-07: 12.5 mg via ORAL
  Filled 2012-08-31: qty 10

## 2012-08-31 MED ORDER — HYDROCHLOROTHIAZIDE 25 MG PO TABS
25.0000 mg | ORAL_TABLET | Freq: Every day | ORAL | Status: DC
  Administered 2012-09-02 – 2012-09-05 (×3): 25 mg via ORAL
  Filled 2012-08-31 (×6): qty 1

## 2012-08-31 MED ORDER — DEXTROSE-NACL 5-0.45 % IV SOLN
INTRAVENOUS | Status: DC
  Administered 2012-08-31 – 2012-09-01 (×3): via INTRAVENOUS

## 2012-08-31 NOTE — H&P (Signed)
[name] is an 67 y.o. female.   Chief Complaint: Infected right total knee HPI: [name] returns in followup for her right knee.  She is 2 weeks status post right total knee arthroplasty.  She noticed drainage from her incision over the weekend and spoke with [name].  He called in a prescription for Keflex 500 mg she has been taking 4 times daily since Sunday.  She was advised to call the office on Monday and come in for early followup.  She reports less drainage over the last few days, but feels that the blisters along her incision may have gotten worse.  She has not had a fever.  Past Medical History  Diagnosis Date  . Allergy   . Hypertension   . Hyperlipidemia   . Fatty liver disease, nonalcoholic   . History of hyperkalemia   . Obesity   . Renal lesion   . Hx antineoplastic chemo  10/12 - 06/24/11    PACLITAX EL COMPLETED 06/24/11  . Anemia   . Diabetes mellitus ORAL MED  . OA (osteoarthritis) of knee     RIGHT LEG  . Mild nonproliferative diabetic retinopathy of right eye 03/19/11    [name]  . Breast CA     (Rt) breast ca dx 4/12---  S/P RADICAL MASTECTOMY AND CHEMORADIATION  . Numbness and tingling     Hx: of in fingers and toes since chemotherapy    Past Surgical History  Procedure Laterality Date  . Knee arthroscopy  10-28-1999    RIGHT  . Mastectomy modified radical  12-04-2010    W/ LEFT PAC PLACEMENT (RIGHT BREAST W/ AXILLARY CONTENTS/ NODE BX'S  . Transthoracic echocardiogram  10-29-2010    NORMAL LVF/ EF 55-60%/ MILD MV REGURG  . Vaginal hysterectomy  AGE 19    W/ LSO  . Tubal ligation  YRS AGO  . Laparoscopy with laparoscopic right salpingo oophorectomy and lysis of pelvic and abdominal adhesions  March 2013    [name]   . Portacath placement      REPLACED [facility] DUE TO MALFUNCTION (LEFT)  . Port-a-cath removal  09/24/2011    Procedure: REMOVAL PORT-A-CATH;  Surgeon: Jillian Pu. Cornett, MD;  Location: [facility] ORS;  Service: General;  Laterality:  N/A;  . Colonoscopy w/ polypectomy      Hx; of  . Total knee arthroplasty Right 08/16/2012    Procedure: TOTAL KNEE ARTHROPLASTY;  Surgeon: [name], MD;  Location: [facility] OR;  Service: Orthopedics;  Laterality: Right;    Family History  Problem Relation Age of Onset  . Stroke Father   . Heart disease Sister   . Cancer Sister     breast  . Heart disease Brother   . Cancer Sister     COLON CANCER   Social History:  reports that she has never smoked. She has never used smokeless tobacco. She reports that she does not drink alcohol or use illicit drugs.  Allergies:  Allergies  Allergen Reactions  . Metformin Other (See Comments)    REACTION, diarrhea: 850 mg - diarrhea but tolerates 500 mg daily    Medications Prior to Admission  Medication Sig Dispense Refill  . anastrozole (ARIMIDEX) 1 MG tablet Take 1 mg by mouth at bedtime.      Marland Kitchen aspirin EC 325 MG EC tablet Take 1 tablet (325 mg total) by mouth daily with breakfast.  30 tablet  0  . diphenhydrAMINE (BENADRYL) 25 mg capsule Take 1 capsule (25  mg total) by mouth every 4 (four) hours as needed for itching.  24 capsule  2  . glipiZIDE (GLUCOTROL) 10 MG tablet Take 1 tablet (10 mg total) by mouth 2 (two) times daily before a meal.  90 tablet  1  . hydrochlorothiazide (HYDRODIURIL) 25 MG tablet Take 1 tablet (25 mg total) by mouth daily.  90 tablet  1  . HYDROmorphone (DILAUDID) 2 MG tablet Take 1-2 tablets (2-4 mg total) by mouth every 4 (four) hours as needed.  60 tablet  0  . lisinopril (PRINIVIL,ZESTRIL) 40 MG tablet Take 1 tablet (40 mg total) by mouth daily.  90 tablet  1  . metFORMIN (GLUCOPHAGE) 500 MG tablet Take 1 tablet (500 mg total) by mouth 2 (two) times daily with a meal.  180 tablet  1  . metoprolol succinate (TOPROL XL) 25 MG 24 hr tablet Take 1 tablet (25 mg total) by mouth daily.  30 tablet  5  . Polyvinyl Alcohol-Povidone (REFRESH OP) Place 1 drop into both eyes daily as needed. Dry eyes      . pravastatin  (PRAVACHOL) 40 MG tablet Take 1 tablet (40 mg total) by mouth at bedtime.  90 tablet  1  . traMADol (ULTRAM) 50 MG tablet Take 50 mg by mouth every 6 (six) hours as needed for pain.        Results for orders placed during the hospital encounter of 08/31/12 (from the past 48 hour(s))  APTT     Status: None   Collection Time    08/31/12 11:45 AM      Result Value Range   aPTT 31  24 - 37 seconds  CBC     Status: Abnormal   Collection Time    08/31/12 11:45 AM      Result Value Range   WBC 9.1  4.0 - 10.5 K/uL   RBC 2.44 (*) 3.87 - 5.11 MIL/uL   Hemoglobin 7.6 (*) 12.0 - 15.0 g/dL   HCT 16.1 (*) 09.6 - 04.5 %   MCV 94.7  78.0 - 100.0 fL   MCH 31.1  26.0 - 34.0 pg   MCHC 32.9  30.0 - 36.0 g/dL   RDW 40.9 (*) 81.1 - 91.4 %   Platelets 429 (*) 150 - 400 K/uL  PROTIME-INR     Status: None   Collection Time    08/31/12 11:45 AM      Result Value Range   Prothrombin Time 14.1  11.6 - 15.2 seconds   INR 1.10  0.00 - 1.49   No results found.  Review of Systems  Constitutional: Negative.   HENT: Negative.   Eyes: Negative.   Respiratory: Negative.   Cardiovascular: Negative.   Gastrointestinal: Negative.   Genitourinary: Negative.   Musculoskeletal: Positive for joint pain.  Skin: Negative.   Neurological: Negative.   Endo/Heme/Allergies: Negative.   Psychiatric/Behavioral: Negative.     There were no vitals taken for this visit. Temp 98.6 Physical Exam  Constitutional: [name], place, and time. She appears well-developed and well-nourished.  HENT:  Head: Normocephalic.  Eyes: Pupils are equal, round, and reactive to light.  Cardiovascular: Intact distal pulses.   Respiratory: Effort normal.  Musculoskeletal:       Right knee: She exhibits swelling. Tenderness found.  Neurological: She is alert and oriented to person, place, and time.  Psychiatric: She has a normal mood and affect.     Assessment/Plan Assess: Wound infection status post right total  knee  arthroplasty on 08/16/12  Plan: We have sent off cultures from the wound today.  We will get [name] admitted to the hospital today and we will start IV vancomycin.  If her wound does not improve on IV antibiotics, we may need to take her to the operating room for irrigation and debridement with possible poly exchange.  Risks and benefits of surgery were discussed with the patient.  Gurshan Settlemire M. 08/31/2012, 1:01 PM

## 2012-08-31 NOTE — Progress Notes (Signed)
Hypoglycemic Event  CBG: 59  Treatment: 15 GM carbohydrate snack  Symptoms: None  Follow-up CBG: Time:1645 CBG Result:88  Possible Reasons for Event: Inadequate meal intake  Comments/MD notified:    Kai Levins  Remember to initiate Hypoglycemia Order Set & complete

## 2012-08-31 NOTE — Progress Notes (Signed)
ANTIBIOTIC CONSULT NOTE - INITIAL  Pharmacy Consult for vancomycin  Indication: infected R TKR  Allergies  Allergen Reactions  . Metformin Other (See Comments)    REACTION, diarrhea: 850 mg - diarrhea but tolerates 500 mg daily    Patient Measurements:   Total Body Weight: 73.4 kg as of 08/09/12 Vital Signs:   Intake/Output from previous day:   Intake/Output from this shift:    Labs: No results found for this basename: WBC, HGB, PLT, LABCREA, CREATININE,  in the last 72 hours The CrCl is unknown because both a height and weight (above a minimum accepted value) are required for this calculation. No results found for this basename: VANCOTROUGH, Leodis Binet, VANCORANDOM, GENTTROUGH, GENTPEAK, GENTRANDOM, TOBRATROUGH, TOBRAPEAK, TOBRARND, AMIKACINPEAK, AMIKACINTROU, AMIKACIN,  in the last 72 hours   Microbiology: Recent Results (from the past 720 hour(s))  SURGICAL PCR SCREEN     Status: None   Collection Time    08/09/12 11:52 AM      Result Value Range Status   MRSA, PCR NEGATIVE  NEGATIVE Final   Staphylococcus aureus NEGATIVE  NEGATIVE Final   Comment:            The Xpert SA Assay (FDA     approved for NASAL specimens     in patients over 38 years of age),     is one component of     a comprehensive surveillance     program.  Test performance has     been validated by [employer] for patients greater     than or equal to 43 year old.     It is not intended     to diagnose infection nor to     guide or monitor treatment.  URINE CULTURE     Status: None   Collection Time    08/09/12 11:55 AM      Result Value Range Status   Specimen Description URINE, CLEAN CATCH   Final   Special Requests NONE   Final   Culture  Setup Time 08/09/2012 13:04   Final   Colony Count 25,000 COLONIES/ML   Final   Culture     Final   Value: Multiple bacterial morphotypes present, none predominant. Suggest appropriate recollection if clinically indicated.   Report Status 08/10/2012  FINAL   Final    Medical History: Past Medical History  Diagnosis Date  . Allergy   . Hypertension   . Hyperlipidemia   . Fatty liver disease, nonalcoholic   . History of hyperkalemia   . Obesity   . Renal lesion   . Hx antineoplastic chemo  10/12 - 06/24/11    PACLITAX EL COMPLETED 06/24/11  . Anemia   . Diabetes mellitus ORAL MED  . OA (osteoarthritis) of knee     RIGHT LEG  . Mild nonproliferative diabetic retinopathy of right eye 03/19/11    Dr. Norva Pavlov  . Breast CA     (Rt) breast ca dx 4/12---  S/P RADICAL MASTECTOMY AND CHEMORADIATION  . Numbness and tingling     Hx: of in fingers and toes since chemotherapy    Medications:  Prescriptions prior to admission  Medication Sig Dispense Refill  . anastrozole (ARIMIDEX) 1 MG tablet Take 1 mg by mouth at bedtime.      Marland Kitchen aspirin EC 325 MG EC tablet Take 1 tablet (325 mg total) by mouth daily with breakfast.  30 tablet  0  . diphenhydrAMINE (BENADRYL) 25 mg capsule  Take 1 capsule (25 mg total) by mouth every 4 (four) hours as needed for itching.  24 capsule  2  . glipiZIDE (GLUCOTROL) 10 MG tablet Take 1 tablet (10 mg total) by mouth 2 (two) times daily before a meal.  90 tablet  1  . hydrochlorothiazide (HYDRODIURIL) 25 MG tablet Take 1 tablet (25 mg total) by mouth daily.  90 tablet  1  . HYDROmorphone (DILAUDID) 2 MG tablet Take 1-2 tablets (2-4 mg total) by mouth every 4 (four) hours as needed.  60 tablet  0  . lisinopril (PRINIVIL,ZESTRIL) 40 MG tablet Take 1 tablet (40 mg total) by mouth daily.  90 tablet  1  . metFORMIN (GLUCOPHAGE) 500 MG tablet Take 1 tablet (500 mg total) by mouth 2 (two) times daily with a meal.  180 tablet  1  . methocarbamol (ROBAXIN) 500 MG tablet Take 1 tablet (500 mg total) by mouth every 6 (six) hours as needed.  60 tablet  0  . metoprolol succinate (TOPROL XL) 25 MG 24 hr tablet Take 1 tablet (25 mg total) by mouth daily.  30 tablet  5  . ondansetron (ZOFRAN) 4 MG tablet Take 1 tablet (4  mg total) by mouth every 6 (six) hours as needed for nausea.  40 tablet  1  . Polyvinyl Alcohol-Povidone (REFRESH OP) Place 1 drop into both eyes daily as needed. Dry eyes      . pravastatin (PRAVACHOL) 40 MG tablet Take 1 tablet (40 mg total) by mouth at bedtime.  90 tablet  1   Assessment: [name] is a 67 yo BF s/p R TKR on 08/16/12.  Pharmacy has been asked to dose vancomycin for infected total joint.  Her weight on 08/09/12 was 73.4 kg.  Her creatinine on last admission was WNL with creat cl > 90 ml/min.  She does not have any admission lab data or chart note available yet for today's admission.  She is scheduled for an I&D of knee with poly exchange 4/30.   Goal of Therapy:  Vancomycin trough level 15-20 mcg/ml  Plan:  1. Vancomycin 1000 mg IV q8h 2. F/u creatinine and culture data Herby Abraham, Pharm.D. [phone] 08/31/2012 12:10 PM

## 2012-09-01 ENCOUNTER — Encounter (HOSPITAL_COMMUNITY)
Admission: AD | Disposition: A | Discharge status: Skilled Nursing Facility | Payer: Self-pay | Source: Ambulatory Visit | Attending: Orthopedic Surgery

## 2012-09-01 ENCOUNTER — Encounter (HOSPITAL_COMMUNITY): Payer: Self-pay | Admitting: Anesthesiology

## 2012-09-01 ENCOUNTER — Encounter (HOSPITAL_COMMUNITY): Payer: Self-pay | Admitting: General Practice

## 2012-09-01 ENCOUNTER — Inpatient Hospital Stay (HOSPITAL_COMMUNITY): Admission: RE | Admit: 2012-09-01 | Payer: Medicare Other | Source: Ambulatory Visit | Admitting: Orthopedic Surgery

## 2012-09-01 LAB — SYNOVIAL CELL COUNT + DIFF, W/ CRYSTALS
Crystals, Fluid: NONE SEEN
Eosinophils-Synovial: 0 % (ref 0–1)
Lymphocytes-Synovial Fld: 3 % (ref 0–20)
Monocyte-Macrophage-Synovial Fluid: 2 % — ABNORMAL LOW (ref 50–90)
Neutrophil, Synovial: 95 % — ABNORMAL HIGH (ref 0–25)
WBC, Synovial: 6333 /mm3 — ABNORMAL HIGH (ref 0–200)

## 2012-09-01 LAB — GLUCOSE, CAPILLARY
Glucose-Capillary: 103 mg/dL — ABNORMAL HIGH (ref 70–99)
Glucose-Capillary: 59 mg/dL — ABNORMAL LOW (ref 70–99)
Glucose-Capillary: 88 mg/dL (ref 70–99)
Glucose-Capillary: 97 mg/dL (ref 70–99)
Glucose-Capillary: 110 mg/dL — ABNORMAL HIGH (ref 70–99)
Glucose-Capillary: 142 mg/dL — ABNORMAL HIGH (ref 70–99)
Glucose-Capillary: 132 mg/dL — ABNORMAL HIGH (ref 70–99)

## 2012-09-01 LAB — URINALYSIS, ROUTINE W REFLEX MICROSCOPIC
Bilirubin Urine: NEGATIVE
Glucose, UA: NEGATIVE mg/dL
Hgb urine dipstick: NEGATIVE
Ketones, ur: NEGATIVE mg/dL
Nitrite: NEGATIVE
Protein, ur: NEGATIVE mg/dL
Specific Gravity, Urine: 1.01 (ref 1.005–1.030)
Urobilinogen, UA: 1 mg/dL (ref 0.0–1.0)
pH: 5.5 (ref 5.0–8.0)

## 2012-09-01 LAB — BASIC METABOLIC PANEL
BUN: 15 mg/dL (ref 6–23)
CO2: 29 mEq/L (ref 19–32)
Calcium: 9.1 mg/dL (ref 8.4–10.5)
Chloride: 102 mEq/L (ref 96–112)
Creatinine, Ser: 0.95 mg/dL (ref 0.50–1.10)
GFR calc Af Amer: 71 mL/min — ABNORMAL LOW (ref >90)
GFR calc non Af Amer: 62 mL/min — ABNORMAL LOW (ref >90)
Glucose, Bld: 130 mg/dL — ABNORMAL HIGH (ref 70–99)
Potassium: 3.5 mEq/L (ref 3.5–5.1)
Sodium: 139 mEq/L (ref 135–145)

## 2012-09-01 LAB — URINE MICROSCOPIC-ADD ON

## 2012-09-01 LAB — GRAM STAIN

## 2012-09-01 SURGERY — IRRIGATION AND DEBRIDEMENT KNEE WITH POLY EXCHANGE
Anesthesia: General | Laterality: Right

## 2012-09-01 NOTE — Anesthesia Preprocedure Evaluation (Deleted)
Anesthesia Evaluation  Patient identified by MRN, date of birth, ID band Patient awake    Reviewed: Allergy & Precautions, H&P , NPO status , Patient's Chart, lab work & pertinent test results, reviewed documented beta blocker date and time   History of Anesthesia Complications Negative for: history of anesthetic complications  Airway Mallampati: II TM Distance: >3 FB Neck ROM: Full    Dental  (+) Teeth Intact and Dental Advisory Given   Pulmonary  breath sounds clear to auscultation  Pulmonary exam normal       Cardiovascular hypertension, Pt. on medications and Pt. on home beta blockers Rhythm:Regular Rate:Normal  Echo 03/2012 - Left ventricle: The cavity size was normal. There was mild  concentric hypertrophy. Systolic function was normal. The  estimated ejection fraction was in the range of 60% to  65%. Wall motion was normal; there were no regional wall  motion abnormalities. Doppler parameters are consistent  with abnormal left ventricular relaxation (grade 1  diastolic dysfunction). - Aortic valve: Trivial regurgitation. - Atrial septum: No defect or patent foramen ovale was   identified.    Neuro/Psych negative neurological ROS     GI/Hepatic negative GI ROS, Neg liver ROS,   Endo/Other  diabetes (glu 122), Well Controlled, Type 2, Oral Hypoglycemic Agents  Renal/GU Renal disease (? renal lesion, function OK)     Musculoskeletal  (+) Arthritis -, Osteoarthritis,    Abdominal (+) + obese,   Peds  Hematology negative hematology ROS (+)   Anesthesia Other Findings Breast cancer: surgery, XRT, and chemo  Reproductive/Obstetrics negative OB ROS                         Anesthesia Physical  Anesthesia Plan  ASA: III  Anesthesia Plan: General   Post-op Pain Management:    Induction: Intravenous  Airway Management Planned: LMA  Additional Equipment:   Intra-op Plan:    Post-operative Plan: Extubation in OR  Informed Consent: I have reviewed the patients History and Physical, chart, labs and discussed the procedure including the risks, benefits and alternatives for the proposed anesthesia with the patient or authorized representative who has indicated his/her understanding and acceptance.   Dental advisory given  Plan Discussed with: CRNA  Anesthesia Plan Comments: (Plan routine monitors, GA- LMA OK No femoral nerve block, surgeon using exparel)       Anesthesia Quick Evaluation

## 2012-09-01 NOTE — Progress Notes (Signed)
Patient ID: [name], female   DOB: 10/18/1945, 67 y.o.   MRN: [id number] PATIENT ID: [name]  MRN: [id number]  DOB/AGE:  12-26-1945 / 67 y.o.         PROGRESS NOTE Subjective: Patient is alert, oriented, no Nausea, no Vomiting, yes passing gas, no Bowel Movement. Taking PO NPO. Denies SOB, Chest or Calf Pain. Using Incentive Spirometer, PAS in place. Ambulate WBAT. Patient reports pain as 4 on 0-10 scale, pain is diminished from yesterday when we admitted her from the office.  Objective: Vital signs in last 24 hours: Filed Vitals:   08/31/12 1400 08/31/12 2100 09/01/12 0624  BP: 119/53 123/57 104/54  Pulse: 88 87 80  Temp: 98.5 F (36.9 C) 98.3 F (36.8 C) 98.1 F (36.7 C)  TempSrc: Oral Oral Oral  Resp: 20 18 18   SpO2: 100% 100% 100%      Intake/Output from previous day: I/O last 3 completed shifts: In: 370 [I.V.:370] Out: -    Intake/Output this shift:     LABORATORY DATA:  Recent Labs  08/31/12 1145 09/01/12 0600 09/01/12 0653  WBC 9.1  --   --   HGB 7.6*  --   --   HCT 23.1*  --   --   PLT 429*  --   --   NA 138 139  --   K 3.7 3.5  --   CL 99 102  --   CO2 29 29  --   BUN 19 15  --   CREATININE 0.94 0.95  --   GLUCOSE 121* 130*  --   GLUCAP  --   --  110*  INR 1.10  --   --   CALCIUM 9.9 9.1  --     Examination: Neurologically intact ABD soft Neurovascular intact Sensation intact distally Intact pulses distally Dorsiflexion/Plantar flexion intact Incision: scant drainage No cellulitis present Compartment soft} Swelling has diminished significantly since staple removal yesterday, I was able to express one drop of pus from one of the staple holes distally on the wound. The skin is actually wrinkling down and she has a barely palpable effusion. After discussing options as well as risks and benefits the patient consented to aspiration of the total knee using an 18-gauge needle and a 20 cc syringe. The skin was cleansed with alcohol  and using a lateral parapatellar approach staying as far away from the incision as I could I was able to aspirate 10 cc of serosanguineous material. This was hand carried to our lab for cell count Gram stain and culture. Assessment:       at a minimum the patient has a superficial wound infection along the staple line of her right total knee. The wound looks much better after staple removal and one dose of vancomycin. Her peripheral white count remains at 9.1, her blood sugars have ranged between 110 and 130, and the fluid aspirate yielded 10 cc of serosanguineous fluid. Her sedimentation rate was 55 and her CRP was 5. With these facts in mind always the results of the Gram stain and cell count to decide whether or not she should actually go to the operating room for a formal irrigation and debridement of her total knee. For now she'll remain on the surgery schedule for this afternoon.  ADDITIONAL DIAGNOSIS:  Diabetes, history of breast cancer.  Plan: Plan to do formal irrigation and debridement in the operating room this afternoon, this may be put on hold if the Gram stain  and cell count of the synovial fluid are not consistent with infection.    [name] J 09/01/2012, 7:32 AM

## 2012-09-02 LAB — GLUCOSE, CAPILLARY
Glucose-Capillary: 15 mg/dL — CL (ref 70–99)
Glucose-Capillary: 95 mg/dL (ref 70–99)
Glucose-Capillary: 106 mg/dL — ABNORMAL HIGH (ref 70–99)
Glucose-Capillary: 83 mg/dL (ref 70–99)
Glucose-Capillary: 74 mg/dL (ref 70–99)

## 2012-09-02 MED ORDER — BISACODYL 10 MG RE SUPP
10.0000 mg | Freq: Every day | RECTAL | Status: DC | PRN
  Filled 2012-09-02: qty 1

## 2012-09-02 MED ORDER — DEXTROSE-NACL 5-0.45 % IV SOLN: INTRAVENOUS | Status: DC

## 2012-09-02 NOTE — Progress Notes (Signed)
Subjective: Patient reports that her right knee pain is greatly diminished compared to 48 hours ago. She is able and he laid about the room, but still feels a little weak. Her appetite is good and she denies any nausea or vomiting.   Objective: Vital signs in last 24 hours: Temp:  [97.9 F (36.6 C)-98.8 F (37.1 C)] 97.9 F (36.6 C) (05/01 0554) Pulse Rate:  [88-106] 92 (05/01 0554) Resp:  [17-19] 18 (05/01 0554) BP: (98-115)/(48-59) 115/48 mmHg (05/01 0554) SpO2:  [100 %] 100 % (05/01 0554) Weight:  [72.576 kg (160 lb)] 72.576 kg (160 lb) (04/30 2348)  Intake/Output from previous day: 04/30 0701 - 05/01 0700 In: 480 [P.O.:480] Out: -  Intake/Output this shift:     Recent Labs  08/31/12 1145  HGB 7.6*    Recent Labs  08/31/12 1145  WBC 9.1  RBC 2.44*  HCT 23.1*  PLT 429*    Recent Labs  08/31/12 1145 09/01/12 0600  NA 138 139  K 3.7 3.5  CL 99 102  CO2 29 29  BUN 19 15  CREATININE 0.94 0.95  GLUCOSE 121* 130*  CALCIUM 9.9 9.1    Recent Labs  08/31/12 1145  INR 1.10    Neurologically intact ABD soft Neurovascular intact Sensation intact distally Intact pulses distally Dorsiflexion/Plantar flexion intact Incision: scant drainage No cellulitis present Compartment soft Patient is still tender along the proximal medial flare of the tibia one or 2 drops of drainage came out of the old staple sites. The overall swelling in this region is diminished from yesterday. The joint itself is nontender and cultures from the aspiration yesterday are negative at 24 hours. Assessment/Plan: I am still concerned about a superficial wound infection along approximately medial tibia and the possibility that communicates with the joint. Because the patient is still symptomatic in this area, despite diminished drainage, I will place her on the surgery schedule for irrigation and debridement tomorrow. Risks and benefits of surgery once again discussed with the patient. I  will examine her early tomorrow morning to see if the swelling in the proximal medial tibia is dramatically better. If so I may cancel the irrigation and debridement.   Aija Scarfo J 09/02/2012, 8:33 AM

## 2012-09-03 ENCOUNTER — Encounter (HOSPITAL_COMMUNITY): Payer: Self-pay | Admitting: Anesthesiology

## 2012-09-03 ENCOUNTER — Encounter (HOSPITAL_COMMUNITY)
Admission: AD | Disposition: A | Discharge status: Skilled Nursing Facility | Payer: Self-pay | Source: Ambulatory Visit | Attending: Orthopedic Surgery

## 2012-09-03 ENCOUNTER — Inpatient Hospital Stay (HOSPITAL_COMMUNITY): Payer: Medicare Other | Admitting: Anesthesiology

## 2012-09-03 DIAGNOSIS — Z96659 Presence of unspecified artificial knee joint: Secondary | ICD-10-CM

## 2012-09-03 DIAGNOSIS — Y831 Surgical operation with implant of artificial internal device as the cause of abnormal reaction of the patient, or of later complication, without mention of misadventure at the time of the procedure: Secondary | ICD-10-CM

## 2012-09-03 DIAGNOSIS — E119 Type 2 diabetes mellitus without complications: Secondary | ICD-10-CM

## 2012-09-03 DIAGNOSIS — T8459XA Infection and inflammatory reaction due to other internal joint prosthesis, initial encounter: Secondary | ICD-10-CM

## 2012-09-03 HISTORY — PX: I & D KNEE WITH POLY EXCHANGE: SHX5024

## 2012-09-03 LAB — GLUCOSE, CAPILLARY
Glucose-Capillary: 53 mg/dL — ABNORMAL LOW (ref 70–99)
Glucose-Capillary: 177 mg/dL — ABNORMAL HIGH (ref 70–99)
Glucose-Capillary: 132 mg/dL — ABNORMAL HIGH (ref 70–99)
Glucose-Capillary: 152 mg/dL — ABNORMAL HIGH (ref 70–99)
Glucose-Capillary: 228 mg/dL — ABNORMAL HIGH (ref 70–99)
Glucose-Capillary: 247 mg/dL — ABNORMAL HIGH (ref 70–99)

## 2012-09-03 LAB — HEMOGLOBIN AND HEMATOCRIT, BLOOD
HCT: 20.8 % — ABNORMAL LOW (ref 36.0–46.0)
Hemoglobin: 7 g/dL — ABNORMAL LOW (ref 12.0–15.0)

## 2012-09-03 SURGERY — IRRIGATION AND DEBRIDEMENT KNEE WITH POLY EXCHANGE
Anesthesia: General | Site: Knee | Laterality: Right | Wound class: Dirty or Infected

## 2012-09-03 MED ORDER — BISACODYL 10 MG RE SUPP: 10.0000 mg | Freq: Every day | RECTAL | Status: DC | PRN

## 2012-09-03 MED ORDER — LIDOCAINE HCL 4 % MT SOLN
OROMUCOSAL | Status: DC | PRN
  Administered 2012-09-03: 4 mL via TOPICAL

## 2012-09-03 MED ORDER — FLEET ENEMA 7-19 GM/118ML RE ENEM: 1.0000 | ENEMA | Freq: Once | RECTAL | Status: AC | PRN

## 2012-09-03 MED ORDER — ACETAMINOPHEN 650 MG RE SUPP: 650.0000 mg | Freq: Four times a day (QID) | RECTAL | Status: DC | PRN

## 2012-09-03 MED ORDER — ALUM & MAG HYDROXIDE-SIMETH 200-200-20 MG/5ML PO SUSP: 30.0000 mL | ORAL | Status: DC | PRN

## 2012-09-03 MED ORDER — OXYCHLOROSENE SODIUM POWD
Status: DC | PRN
  Administered 2012-09-03: 1

## 2012-09-03 MED ORDER — ROCURONIUM BROMIDE 100 MG/10ML IV SOLN
INTRAVENOUS | Status: DC | PRN
  Administered 2012-09-03: 30 mg via INTRAVENOUS

## 2012-09-03 MED ORDER — HYDROMORPHONE HCL PF 1 MG/ML IJ SOLN
0.2500 mg | INTRAMUSCULAR | Status: DC | PRN
  Administered 2012-09-03 (×5): 0.5 mg via INTRAVENOUS
  Filled 2012-09-03: qty 1

## 2012-09-03 MED ORDER — MENTHOL 3 MG MT LOZG: 1.0000 | LOZENGE | OROMUCOSAL | Status: DC | PRN

## 2012-09-03 MED ORDER — LACTATED RINGERS IV SOLN
INTRAVENOUS | Status: DC | PRN
  Administered 2012-09-03: 11:00:00 via INTRAVENOUS

## 2012-09-03 MED ORDER — FENTANYL CITRATE 0.05 MG/ML IJ SOLN
INTRAMUSCULAR | Status: DC | PRN
  Administered 2012-09-03 (×2): 50 ug via INTRAVENOUS
  Administered 2012-09-03: 100 ug via INTRAVENOUS
  Administered 2012-09-03: 50 ug via INTRAVENOUS

## 2012-09-03 MED ORDER — GENTAMICIN SULFATE 40 MG/ML IJ SOLN
INTRAMUSCULAR | Status: DC | PRN
  Administered 2012-09-03 (×3): 80 mg

## 2012-09-03 MED ORDER — METOCLOPRAMIDE HCL 10 MG PO TABS: 5.0000 mg | ORAL_TABLET | Freq: Three times a day (TID) | ORAL | Status: DC | PRN

## 2012-09-03 MED ORDER — LIDOCAINE HCL (CARDIAC) 20 MG/ML IV SOLN
INTRAVENOUS | Status: DC | PRN
  Administered 2012-09-03: 60 mg via INTRAVENOUS

## 2012-09-03 MED ORDER — HYDROMORPHONE HCL PF 1 MG/ML IJ SOLN
1.0000 mg | INTRAMUSCULAR | Status: DC | PRN
  Administered 2012-09-03 – 2012-09-04 (×2): 1 mg via INTRAVENOUS
  Filled 2012-09-03 (×2): qty 1

## 2012-09-03 MED ORDER — MIDAZOLAM HCL 5 MG/5ML IJ SOLN
INTRAMUSCULAR | Status: DC | PRN
  Administered 2012-09-03: 2 mg via INTRAVENOUS

## 2012-09-03 MED ORDER — MAGNESIUM HYDROXIDE 400 MG/5ML PO SUSP: 30.0000 mL | Freq: Every day | ORAL | Status: DC | PRN

## 2012-09-03 MED ORDER — OXYCODONE HCL 5 MG/5ML PO SOLN: 5.0000 mg | Freq: Once | ORAL | Status: AC | PRN

## 2012-09-03 MED ORDER — SENNA 8.6 MG PO TABS: 1.0000 | ORAL_TABLET | Freq: Two times a day (BID) | ORAL | Status: DC

## 2012-09-03 MED ORDER — GLYCOPYRROLATE 0.2 MG/ML IJ SOLN
INTRAMUSCULAR | Status: DC | PRN
  Administered 2012-09-03: 0.3 mg via INTRAVENOUS

## 2012-09-03 MED ORDER — VANCOMYCIN HCL 1000 MG IV SOLR
INTRAVENOUS | Status: DC | PRN
  Administered 2012-09-03: 1000 mg

## 2012-09-03 MED ORDER — DOCUSATE SODIUM 100 MG PO CAPS: 100.0000 mg | ORAL_CAPSULE | Freq: Two times a day (BID) | ORAL | Status: DC

## 2012-09-03 MED ORDER — RIFAMPIN 300 MG PO CAPS
300.0000 mg | ORAL_CAPSULE | Freq: Every day | ORAL | Status: DC
  Administered 2012-09-04 – 2012-09-08 (×5): 300 mg via ORAL
  Filled 2012-09-03 (×6): qty 1

## 2012-09-03 MED ORDER — METOCLOPRAMIDE HCL 5 MG/ML IJ SOLN: 5.0000 mg | Freq: Three times a day (TID) | INTRAMUSCULAR | Status: DC | PRN

## 2012-09-03 MED ORDER — METOCLOPRAMIDE HCL 5 MG/ML IJ SOLN: 10.0000 mg | Freq: Once | INTRAMUSCULAR | Status: AC | PRN

## 2012-09-03 MED ORDER — DEXTROSE 5 % IV SOLN
1.0000 g | INTRAVENOUS | Status: DC
  Administered 2012-09-03 – 2012-09-04 (×2): 1 g via INTRAVENOUS
  Filled 2012-09-03 (×4): qty 10

## 2012-09-03 MED ORDER — SODIUM CHLORIDE 0.9 % IR SOLN
Status: DC | PRN
  Administered 2012-09-03: 3000 mL

## 2012-09-03 MED ORDER — METHOCARBAMOL 500 MG PO TABS
500.0000 mg | ORAL_TABLET | Freq: Four times a day (QID) | ORAL | Status: DC | PRN
  Administered 2012-09-04 (×2): 500 mg via ORAL
  Filled 2012-09-03: qty 1

## 2012-09-03 MED ORDER — OXYCODONE HCL 5 MG PO TABS: 5.0000 mg | ORAL_TABLET | Freq: Once | ORAL | Status: AC | PRN

## 2012-09-03 MED ORDER — ONDANSETRON HCL 4 MG/2ML IJ SOLN: 4.0000 mg | Freq: Four times a day (QID) | INTRAMUSCULAR | Status: DC | PRN

## 2012-09-03 MED ORDER — METHOCARBAMOL 100 MG/ML IJ SOLN: 500.0000 mg | Freq: Four times a day (QID) | INTRAVENOUS | Status: DC | PRN

## 2012-09-03 MED ORDER — KCL IN DEXTROSE-NACL 20-5-0.45 MEQ/L-%-% IV SOLN
INTRAVENOUS | Status: DC
  Administered 2012-09-03: 1000 mL via INTRAVENOUS
  Administered 2012-09-05: 13:00:00 via INTRAVENOUS
  Filled 2012-09-03 (×17): qty 1000

## 2012-09-03 MED ORDER — HYDROGEN PEROXIDE 3 % EX SOLN
CUTANEOUS | Status: DC | PRN
  Administered 2012-09-03: 1 via TOPICAL

## 2012-09-03 MED ORDER — NEOSTIGMINE METHYLSULFATE 1 MG/ML IJ SOLN
INTRAMUSCULAR | Status: DC | PRN
  Administered 2012-09-03: 2.5 mg via INTRAVENOUS

## 2012-09-03 MED ORDER — ACETAMINOPHEN 325 MG PO TABS: 650.0000 mg | ORAL_TABLET | Freq: Four times a day (QID) | ORAL | Status: DC | PRN

## 2012-09-03 MED ORDER — PHENOL 1.4 % MT LIQD: 1.0000 | OROMUCOSAL | Status: DC | PRN

## 2012-09-03 MED ORDER — CELECOXIB 200 MG PO CAPS
200.0000 mg | ORAL_CAPSULE | Freq: Two times a day (BID) | ORAL | Status: DC
Start: 2012-09-03 — End: 2012-09-05
  Administered 2012-09-03 – 2012-09-05 (×4): 200 mg via ORAL
  Filled 2012-09-03 (×5): qty 1

## 2012-09-03 MED ORDER — ONDANSETRON HCL 4 MG/2ML IJ SOLN
INTRAMUSCULAR | Status: DC | PRN
  Administered 2012-09-03: 4 mg via INTRAVENOUS

## 2012-09-03 MED ORDER — ONDANSETRON HCL 4 MG PO TABS: 4.0000 mg | ORAL_TABLET | Freq: Four times a day (QID) | ORAL | Status: DC | PRN

## 2012-09-03 MED ORDER — PHENYLEPHRINE HCL 10 MG/ML IJ SOLN
INTRAMUSCULAR | Status: DC | PRN
  Administered 2012-09-03: 40 ug via INTRAVENOUS
  Administered 2012-09-03: 80 ug via INTRAVENOUS
  Administered 2012-09-03: 120 ug via INTRAVENOUS
  Administered 2012-09-03 (×3): 80 ug via INTRAVENOUS

## 2012-09-03 MED ORDER — PROPOFOL 10 MG/ML IV BOLUS
INTRAVENOUS | Status: DC | PRN
  Administered 2012-09-03: 200 mg via INTRAVENOUS

## 2012-09-03 MED ORDER — ASPIRIN EC 325 MG PO TBEC: 325.0000 mg | DELAYED_RELEASE_TABLET | Freq: Every day | ORAL | Status: DC

## 2012-09-03 MED ORDER — VANCOMYCIN HCL IN DEXTROSE 1-5 GM/200ML-% IV SOLN: 1000.0000 mg | Freq: Two times a day (BID) | INTRAVENOUS | Status: DC

## 2012-09-03 MED ORDER — 0.9 % SODIUM CHLORIDE (POUR BTL) OPTIME
TOPICAL | Status: DC | PRN
  Administered 2012-09-03: 1000 mL

## 2012-09-03 MED ORDER — DIPHENHYDRAMINE HCL 12.5 MG/5ML PO ELIX: 12.5000 mg | ORAL_SOLUTION | ORAL | Status: DC | PRN

## 2012-09-03 MED ORDER — METOPROLOL TARTRATE 1 MG/ML IV SOLN
INTRAVENOUS | Status: DC | PRN
  Administered 2012-09-03: 2.5 mg via INTRAVENOUS

## 2012-09-03 SURGICAL SUPPLY — 64 items
BANDAGE ELASTIC 4 VELCRO ST LF (GAUZE/BANDAGES/DRESSINGS) ×2 IMPLANT
BANDAGE ELASTIC 6 VELCRO ST LF (GAUZE/BANDAGES/DRESSINGS) ×1 IMPLANT
BANDAGE GAUZE ELAST BULKY 4 IN (GAUZE/BANDAGES/DRESSINGS) ×1 IMPLANT
BLADE SURG 10 STRL SS (BLADE) ×2 IMPLANT
BNDG COHESIVE 4X5 TAN STRL (GAUZE/BANDAGES/DRESSINGS) ×2 IMPLANT
CLOTH BEACON ORANGE TIMEOUT ST (SAFETY) ×2 IMPLANT
COVER MAYO STAND STRL (DRAPES) ×1 IMPLANT
COVER SURGICAL LIGHT HANDLE (MISCELLANEOUS) ×2 IMPLANT
CUFF TOURNIQUET SINGLE 18IN (TOURNIQUET CUFF) ×1 IMPLANT
CUFF TOURNIQUET SINGLE 24IN (TOURNIQUET CUFF) IMPLANT
CUFF TOURNIQUET SINGLE 34IN LL (TOURNIQUET CUFF) IMPLANT
CUFF TOURNIQUET SINGLE 44IN (TOURNIQUET CUFF) IMPLANT
DRAPE U-SHAPE 47X51 STRL (DRAPES) ×1 IMPLANT
DURAPREP 26ML APPLICATOR (WOUND CARE) ×2 IMPLANT
ELECT REM PT RETURN 9FT ADLT (ELECTROSURGICAL) ×2
ELECTRODE REM PT RTRN 9FT ADLT (ELECTROSURGICAL) IMPLANT
EVACUATOR 1/8 PVC DRAIN (DRAIN) ×1 IMPLANT
EVACUATOR 3/16  PVC DRAIN (DRAIN) ×1
EVACUATOR 3/16 PVC DRAIN (DRAIN) IMPLANT
FACESHIELD LNG OPTICON STERILE (SAFETY) ×4 IMPLANT
GAUZE XEROFORM 1X8 LF (GAUZE/BANDAGES/DRESSINGS) ×1 IMPLANT
GLOVE BIO SURGEON STRL SZ7 (GLOVE) ×3 IMPLANT
GLOVE BIO SURGEON STRL SZ7.5 (GLOVE) ×2 IMPLANT
GLOVE BIOGEL PI IND STRL 7.0 (GLOVE) ×1 IMPLANT
GLOVE BIOGEL PI IND STRL 7.5 (GLOVE) IMPLANT
GLOVE BIOGEL PI IND STRL 8 (GLOVE) ×1 IMPLANT
GLOVE BIOGEL PI INDICATOR 7.0 (GLOVE) ×2
GLOVE BIOGEL PI INDICATOR 7.5 (GLOVE) ×1
GLOVE BIOGEL PI INDICATOR 8 (GLOVE) ×1
GLOVE ECLIPSE 6.5 STRL STRAW (GLOVE) ×2 IMPLANT
GLOVE SURG SS PI 6.5 STRL IVOR (GLOVE) ×1 IMPLANT
GOWN STRL NON-REIN LRG LVL3 (GOWN DISPOSABLE) ×9 IMPLANT
HANDPIECE INTERPULSE COAX TIP (DISPOSABLE)
IMMOBILIZER KNEE 20 (SOFTGOODS) ×2
IMMOBILIZER KNEE 20 THIGH 36 (SOFTGOODS) IMPLANT
KIT BASIN OR (CUSTOM PROCEDURE TRAY) ×2 IMPLANT
KIT ROOM TURNOVER OR (KITS) ×2 IMPLANT
KIT STIMULAN RAPID CURE  10CC (Orthopedic Implant) ×1 IMPLANT
KIT STIMULAN RAPID CURE 10CC (Orthopedic Implant) IMPLANT
MANIFOLD NEPTUNE II (INSTRUMENTS) ×2 IMPLANT
NS IRRIG 1000ML POUR BTL (IV SOLUTION) ×2 IMPLANT
PACK ORTHO EXTREMITY (CUSTOM PROCEDURE TRAY) ×2 IMPLANT
PAD ARMBOARD 7.5X6 YLW CONV (MISCELLANEOUS) ×4 IMPLANT
PADDING CAST COTTON 6X4 STRL (CAST SUPPLIES) ×1 IMPLANT
PENCIL BUTTON HOLSTER BLD 10FT (ELECTRODE) IMPLANT
PLATE ROT INSERT 10MM SIZE 2.5 (Plate) ×1 IMPLANT
SET HNDPC FAN SPRY TIP SCT (DISPOSABLE) IMPLANT
SPONGE GAUZE 4X4 12PLY (GAUZE/BANDAGES/DRESSINGS) ×3 IMPLANT
SPONGE LAP 18X18 X RAY DECT (DISPOSABLE) ×3 IMPLANT
STOCKINETTE IMPERVIOUS 9X36 MD (GAUZE/BANDAGES/DRESSINGS) ×2 IMPLANT
SUT ETHILON 3 0 PS 1 (SUTURE) ×1 IMPLANT
SUT VIC AB 0 CT1 27 (SUTURE) ×2
SUT VIC AB 0 CT1 27XBRD ANBCTR (SUTURE) IMPLANT
SUT VIC AB 2-0 CT1 27 (SUTURE) ×4
SUT VIC AB 2-0 CT1 TAPERPNT 27 (SUTURE) IMPLANT
SWAB COLLECTION DEVICE MRSA (MISCELLANEOUS) ×1 IMPLANT
TOWEL OR 17X24 6PK STRL BLUE (TOWEL DISPOSABLE) ×2 IMPLANT
TOWEL OR 17X26 10 PK STRL BLUE (TOWEL DISPOSABLE) ×2 IMPLANT
TRAY FOLEY BAG SILVER LF 14FR (CATHETERS) ×1 IMPLANT
TUBE ANAEROBIC SPECIMEN COL (MISCELLANEOUS) ×1 IMPLANT
TUBE CONNECTING 12X1/4 (SUCTIONS) ×2 IMPLANT
UNDERPAD 30X30 INCONTINENT (UNDERPADS AND DIAPERS) ×1 IMPLANT
WATER STERILE IRR 1000ML POUR (IV SOLUTION) ×2 IMPLANT
YANKAUER SUCT BULB TIP NO VENT (SUCTIONS) ×2 IMPLANT

## 2012-09-03 NOTE — Preoperative (Signed)
Beta Blockers   Reason not to administer Beta Blockers:Not Applicable 

## 2012-09-03 NOTE — Interval H&P Note (Signed)
History and Physical Interval Note:  09/03/2012 10:59 AM  [name]  has presented today for surgery, with the diagnosis of infected right total knee arthroplasty  The various methods of treatment have been discussed with the patient and family. After consideration of risks, benefits and other options for treatment, the patient has consented to  Procedure(s): IRRIGATION AND DEBRIDEMENT RIGHT KNEE WITH POLY EXCHANGE (Right) as a surgical intervention .  The patient's history has been reviewed, patient examined, no change in status, stable for surgery.  I have reviewed the patient's chart and labs.  Questions were answered to the patient's satisfaction.     [name]

## 2012-09-03 NOTE — Consult Note (Signed)
INFECTIOUS DISEASE CONSULT NOTE  Date of Admission:  08/31/2012  Date of Consult:  09/03/2012  Reason for Consult: Infected R TKR Referring Physician: Turner Daniels  Impression/Recommendation Infected R TKR Anemia DM  Would- Continue vanco Add rifampin Add cefriaxone Await Cx pic to be placed Her wound may be helped by improving her DM and her anemia.   Comment- she has 6333 WBC with 95% N in her knee fluid. I would consider it infected. Since her prosthetic is in place, would add rifampin to help with static organisms.  Dr Drue Second is available if  Questions over the weekend. I will ask her to f/u pt's cx.   Thank you so much for this interesting consult,   [name] [phone]  [name] is an 67 y.o. female.  HPI: 67 yo F with hx of DM2 and R TKR on 08-16-12 for end stage osteoarthritis. She states that she went home on 4-17 and that her knee was doing well/wound closed. She developed some bloody d/c from her knee at home. She states that she also had fever and chills. By 4-27 she was started on kelfex for this d/c. She also developed blisters around her wound. She was re-adm to the hospital on 4-29 and started on vancomycin.  She had Cx done as an outpt that were (-). She had a wound cx in the hospital that has been negative as well.  She had improved pain in the hospital but due to concerns about deeper infection she was taken to OR today and underwent I & D, poly-exchange, and anbx bead placement.  Currently she c/o pain at her wound site.   Past Medical History  Diagnosis Date  . Allergy   . Hypertension   . Hyperlipidemia   . Fatty liver disease, nonalcoholic   . History of hyperkalemia   . Obesity   . Renal lesion   . Hx antineoplastic chemo  10/12 - 06/24/11    PACLITAX EL COMPLETED 06/24/11  . Anemia   . Diabetes mellitus ORAL MED  . OA (osteoarthritis) of knee     RIGHT LEG  . Mild nonproliferative diabetic retinopathy of right eye 03/19/11    Dr. Norva Pavlov  . Breast CA     (Rt) breast ca dx 4/12---  S/P RADICAL MASTECTOMY AND CHEMORADIATION  . Numbness and tingling     Hx: of in fingers and toes since chemotherapy    Past Surgical History  Procedure Laterality Date  . Knee arthroscopy  10-28-1999    RIGHT  . Mastectomy modified radical  12-04-2010    W/ LEFT PAC PLACEMENT (RIGHT BREAST W/ AXILLARY CONTENTS/ NODE BX'S  . Transthoracic echocardiogram  10-29-2010    NORMAL LVF/ EF 55-60%/ MILD MV REGURG  . Vaginal hysterectomy  AGE 51    W/ LSO  . Tubal ligation  YRS AGO  . Laparoscopy with laparoscopic right salpingo oophorectomy and lysis of pelvic and abdominal adhesions  March 2013    Dr. Jennette Kettle   . Portacath placement      REPLACED [facility] DUE TO MALFUNCTION (LEFT)  . Port-a-cath removal  09/24/2011    Procedure: REMOVAL PORT-A-CATH;  Surgeon: Clovis Pu. Cornett, MD;  Location: [facility] ORS;  Service: General;  Laterality: N/A;  . Colonoscopy w/ polypectomy      Hx; of  . Total knee arthroplasty Right 08/16/2012    Procedure: TOTAL KNEE ARTHROPLASTY;  Surgeon: [name], MD;  Location: [facility] OR;  Service: Orthopedics;  Laterality: Right;  Allergies  Allergen Reactions  . Metformin Other (See Comments)    REACTION, diarrhea: 850 mg - diarrhea but tolerates 500 mg daily    Medications:  Scheduled: . anastrozole  1 mg Oral QHS  . aspirin EC  325 mg Oral Q breakfast  . docusate sodium  100 mg Oral BID  . glipiZIDE  10 mg Oral BID AC  . hydrochlorothiazide  25 mg Oral Daily  . insulin aspart  0-15 Units Subcutaneous TID WC  . lisinopril  40 mg Oral Daily  . metFORMIN  500 mg Oral BID WC  . metoprolol succinate  25 mg Oral Daily  . senna  1 tablet Oral BID  . simvastatin  20 mg Oral q1800  . vancomycin  1,000 mg Intravenous Q8H    Total days of antibiotics: 4 (vanco)          Social History:  reports that she has never smoked. She has never used smokeless tobacco. She reports that she does not drink alcohol or use  illicit drugs.  Family History  Problem Relation Age of Onset  . Stroke Father   . Heart disease Sister   . Cancer Sister     breast  . Heart disease Brother   . Cancer Sister     COLON CANCER    General ROS: constipation. no dysuria, See HPI.   Blood pressure 147/61, pulse 65, temperature 99.2 F (37.3 C), temperature source Oral, resp. rate 9, height 4\' 11"  (1.499 m), weight 72.576 kg (160 lb), SpO2 100.00%. General appearance: alert, cooperative and moderate distress Eyes: negative findings: pupils equal, round, reactive to light and accomodation Throat: normal findings: oropharynx pink & moist without lesions or evidence of thrush Neck: no adenopathy and supple, symmetrical, trachea midline Lungs: clear to auscultation bilaterally Heart: regular rate and rhythm Abdomen: normal findings: bowel sounds normal and soft, non-tender Extremities: RLE wrapped Neurologic: Sensory: normal, groslly nl light touch RLE   Results for orders placed during the hospital encounter of 08/31/12 (from the past 48 hour(s))  GLUCOSE, CAPILLARY     Status: Abnormal   Collection Time    09/01/12  4:46 PM      Result Value Range   Glucose-Capillary 132 (*) 70 - 99 mg/dL   Comment 1 Documented in Chart     Comment 2 Notify RN    GLUCOSE, CAPILLARY     Status: None   Collection Time    09/02/12  6:45 AM      Result Value Range   Glucose-Capillary 95  70 - 99 mg/dL  GLUCOSE, CAPILLARY     Status: Abnormal   Collection Time    09/02/12 11:02 AM      Result Value Range   Glucose-Capillary 106 (*) 70 - 99 mg/dL   Comment 1 Notify RN     Comment 2 Documented in Chart    GLUCOSE, CAPILLARY     Status: None   Collection Time    09/02/12  5:28 PM      Result Value Range   Glucose-Capillary 83  70 - 99 mg/dL   Comment 1 Notify RN     Comment 2 Documented in Chart    GLUCOSE, CAPILLARY     Status: None   Collection Time    09/02/12  9:15 PM      Result Value Range   Glucose-Capillary 74  70  - 99 mg/dL  GLUCOSE, CAPILLARY     Status: Abnormal   Collection Time  09/03/12  4:11 AM      Result Value Range   Glucose-Capillary 177 (*) 70 - 99 mg/dL  GLUCOSE, CAPILLARY     Status: Abnormal   Collection Time    09/03/12  6:59 AM      Result Value Range   Glucose-Capillary 132 (*) 70 - 99 mg/dL  GLUCOSE, CAPILLARY     Status: Abnormal   Collection Time    09/03/12  1:07 PM      Result Value Range   Glucose-Capillary 152 (*) 70 - 99 mg/dL   Comment 1 Documented in Chart     Comment 2 Notify RN        Component Value Date/Time   SDES FLUID SYNOVIAL RIGHT KNEE 09/01/2012 0730   SDES FLUID SYNOVIAL RIGHT KNEE 09/01/2012 0730   SPECREQUEST PATIENT ON FOLLOWING VANCO 09/01/2012 0730   SPECREQUEST PATIENT ON FOLLOWING VANCO 09/01/2012 0730   CULT NO GROWTH 2 DAYS 09/01/2012 0730   REPTSTATUS 09/01/2012 FINAL 09/01/2012 0730   REPTSTATUS PENDING 09/01/2012 0730   No results found. Recent Results (from the past 240 hour(s))  GRAM STAIN     Status: None   Collection Time    09/01/12  7:30 AM      Result Value Range Status   Specimen Description FLUID SYNOVIAL RIGHT KNEE   Final   Special Requests PATIENT ON FOLLOWING VANCO   Final   Gram Stain     Final   Value: RARE WBC PRESENT, PREDOMINANTLY PMN     NO ORGANISMS SEEN     VERBAL REPORT GIVEN TO DR. Turner Daniels 09/01/12 0715   Report Status 09/01/2012 FINAL   Final  BODY FLUID CULTURE     Status: None   Collection Time    09/01/12  7:30 AM      Result Value Range Status   Specimen Description FLUID SYNOVIAL RIGHT KNEE   Final   Special Requests PATIENT ON FOLLOWING VANCO   Final   Gram Stain     Final   Value: ABUNDANT WBC PRESENT, PREDOMINANTLY PMN     NO ORGANISMS SEEN   Culture NO GROWTH 2 DAYS   Final   Report Status PENDING   Incomplete      09/03/2012, 3:33 PM     LOS: 3 days

## 2012-09-03 NOTE — Progress Notes (Signed)
ANTIBIOTIC CONSULT NOTE - follow up Pharmacy Consult for vancomycin  Indication: infected R TKR  Allergies  Allergen Reactions  . Metformin Other (See Comments)    REACTION, diarrhea: 850 mg - diarrhea but tolerates 500 mg daily    Patient Measurements: Height: 4\' 11"  (149.9 cm) Weight: 160 lb (72.576 kg) IBW/kg (Calculated) : 43.2 Total Body Weight: 73.4 kg as of 08/09/12 Vital Signs: Temp: 98.3 F (36.8 C) (05/02 1305) Temp src: Oral (05/02 0528) BP: 131/72 mmHg (05/02 1335) Pulse Rate: 64 (05/02 1340) Intake/Output from previous day: 05/01 0701 - 05/02 0700 In: 240 [P.O.:240] Out: -  Intake/Output from this shift: Total I/O In: 700 [I.V.:700] Out: 275 [Urine:250; Blood:25]  Labs:  Recent Labs  09/01/12 0600  CREATININE 0.95   Estimated Creatinine Clearance: 51.3 ml/min (by C-G formula based on Cr of 0.95). No results found for this basename: VANCOTROUGH, Leodis Binet, VANCORANDOM, GENTTROUGH, GENTPEAK, GENTRANDOM, TOBRATROUGH, TOBRAPEAK, TOBRARND, AMIKACINPEAK, AMIKACINTROU, AMIKACIN,  in the last 72 hours   Microbiology: Recent Results (from the past 720 hour(s))  SURGICAL PCR SCREEN     Status: None   Collection Time    08/09/12 11:52 AM      Result Value Range Status   MRSA, PCR NEGATIVE  NEGATIVE Final   Staphylococcus aureus NEGATIVE  NEGATIVE Final   Comment:            The Xpert SA Assay (FDA     approved for NASAL specimens     in patients over 16 years of age),     is one component of     a comprehensive surveillance     program.  Test performance has     been validated by [employer] for patients greater     than or equal to 72 year old.     It is not intended     to diagnose infection nor to     guide or monitor treatment.  URINE CULTURE     Status: None   Collection Time    08/09/12 11:55 AM      Result Value Range Status   Specimen Description URINE, CLEAN CATCH   Final   Special Requests NONE   Final   Culture  Setup Time 08/09/2012  13:04   Final   Colony Count 25,000 COLONIES/ML   Final   Culture     Final   Value: Multiple bacterial morphotypes present, none predominant. Suggest appropriate recollection if clinically indicated.   Report Status 08/10/2012 FINAL   Final  GRAM STAIN     Status: None   Collection Time    09/01/12  7:30 AM      Result Value Range Status   Specimen Description FLUID SYNOVIAL RIGHT KNEE   Final   Special Requests PATIENT ON FOLLOWING VANCO   Final   Gram Stain     Final   Value: RARE WBC PRESENT, PREDOMINANTLY PMN     NO ORGANISMS SEEN     VERBAL REPORT GIVEN TO DR. Turner Daniels 09/01/12 0715   Report Status 09/01/2012 FINAL   Final  BODY FLUID CULTURE     Status: None   Collection Time    09/01/12  7:30 AM      Result Value Range Status   Specimen Description FLUID SYNOVIAL RIGHT KNEE   Final   Special Requests PATIENT ON FOLLOWING VANCO   Final   Gram Stain     Final   Value: ABUNDANT WBC PRESENT,  PREDOMINANTLY PMN     NO ORGANISMS SEEN   Culture NO GROWTH 2 DAYS   Final   Report Status PENDING   Incomplete   Assessment: [name] is a 67 yo BF s/p R TKR on 08/16/12.  She is on vancomycin day # 4 for infected total joint.  Her weight on 08/09/12 was 73.4 kg.  Her creatinine 4/30 = 0.95 with creat cl  ~ 71  ml/min.  Her synovial fluid cx is no growth x 2 days.  She is s/p  I&D of knee with poly exchange today. Her WBC 4/29 was 9.1 and she has been afebrile.   Goal of Therapy:  Vancomycin trough level 15-20 mcg/ml  Plan:  1. Continue Vancomycin 1000 mg IV q8h 2. Check a vancomycin trough if vancomycin to continue for > 7 days Herby Abraham, Pharm.D. [phone] 09/03/2012 2:10 PM

## 2012-09-03 NOTE — Op Note (Signed)
PATIENT ID:      CHELE CORNELL  MRN:     [id number] DOB/AGE:    65-Aug-1947 / 65 y.o.       OPERATIVE REPORT    DATE OF PROCEDURE:  09/03/2012       PREOPERATIVE DIAGNOSIS:   infected right total knee arthroplasty      Estimated body mass index is 32.3 kg/(m^2) as calculated from the following:   Height as of this encounter: 4\' 11"  (1.499 m).   Weight as of this encounter: 72.576 kg (160 lb).                                                        POSTOPERATIVE DIAGNOSIS:   infected right total knee arthroplasty                                                                      PROCEDURE: Radical irrigation and debridement of infected total knee arthroplasty with removal 10 millimeter polyethylene bearing and replacement with a new 10 mm 2.5 polyethylene bearing we also placed stimulan beats with vancomycin and gentamicin.     SURGEON: WUJWJ,XBJYN J    ASSISTANT:   Shirl Harris PA-C   (Present and scrubbed throughout the case, critical for assistance with exposure, retraction, instrumentation, and closure.)         ANESTHESIA: GET   DRAINS: foley, 2 medium hemovac in knee, 2 superficial small Hemovac   TOURNIQUET TIME:   COMPLICATIONS:  None     SPECIMENS: Infected Synovial fluid and tissue for Gram stain and culture.   INDICATIONS FOR PROCEDURE: Patient is status post total knee arthroplasty 15 days ago. Patient presented to clinic 3 days ago with purulent material coming from the staple sites was felt smoothly 7 superficial infection and admitted to the hospital for IV vancomycin. Overnight she actually got significantly better and we held off on surgery, however the swelling and pain has persisted the synovium is boggy and despite the negative cultures on aspirate of the knee clinically I feel that she may be infected. She is therefore taken for radical irrigation and debridement, revision of the polyethylene bearing, and placement of stimulant beats followed by IV  antibiotics per infectious diseases  DESCRIPTION OF PROCEDURE: The patient identified by armband, received  And IV antibiotics, in the holding area at [facility]. Patient taken to the operating room, appropriate anesthetic  monitors were attached General endotracheal anesthesia induced with  the patient in supine position, Foley catheter was inserted. Tourniquet  applied high to the operative thigh. Lateral post and foot positioner  applied to the table, the lower extremity was then prepped and draped  in usual sterile fashion from the ankle to the tourniquet. Time-out procedure was performed. The limb was kept elevated during the prep to drain blood without compressing the infected region.  The tourniquet was then inflated to 350 mmHg. We began the operation by making the anterior midline incision starting at handbreadth above the patella going over the patella 1 cm medial to and  6 cm distal  to the tibial tubercle. We utilized the previous incision from 2 weeks ago. Small bleeders in the skin and the  subcutaneous tissue identified and cauterized. Transverse retinaculum was incised and reflected medially and a medial parapatellar arthrotomy was accomplished. We immediately encountered purulent material and inflamed synovium and specimens were sent for Gram stain and culture. The patella was everted and the superficial medial collateral ligament was then elevated from anterior to posterior along the proximal  flare of the tibia. We then set about sharply excising all inflamed synovium that was visible with the patella everted and the knee flexed.  Polyethylene bearing was then removed by hyperflexing and externally rotating the tibia.  We removed large amounts of hematoma in inflamed synovium medially laterally and anteriorly and with the bearing out posteriorly. After sharp excision of devitalized dead tissue wound was irrigated alternating with pulse lavage, hydrogen peroxide, and Clorpactin  and was left in for 1 minute to help strip biofilm from the metal. After 2 full rounds of urination we again removed more dead and devitalized material changed gloves and placed a new 10 mm 2.5 DePuy polyethylene bearing. Stimulan beads with vancomycin and gentamicin the next at the back table were then placed in the wound, 2 large bore Hemovacs deep and the parapatellar arthrotomy was closed with running 0 Vicryl suture. We then placed 2 medium Hemovac superficially and closed the subcutaneous and subcuticular tissue with 0 Vicryl. The drains were then attached to make sure that there was no leak and a dressing of 4 x 4's web roll and Ace wrap applied the tourniquet was let down and the patient had good pulses to the foot. A knee immobilizer was placed. The patient was then awakened, extubated, and taken to recovery room without difficulty.   [name] 09/03/2012, 12:55 PM

## 2012-09-03 NOTE — Transfer of Care (Signed)
Immediate Anesthesia Transfer of Care Note  Patient: [name]  Procedure(s) Performed: Procedure(s): IRRIGATION AND DEBRIDEMENT RIGHT KNEE WITH POLY EXCHANGE (Right)  Patient Location: PACU  Anesthesia Type:General  Level of Consciousness: awake  Airway & Oxygen Therapy: Patient Spontanous Breathing and Patient connected to nasal cannula oxygen  Post-op Assessment: Report given to PACU RN  Post vital signs: Reviewed and stable  Complications: No apparent anesthesia complications

## 2012-09-03 NOTE — Care Management Note (Signed)
CARE MANAGEMENT NOTE 09/03/2012  Patient:  HALLE, DAVLIN A   Account Number:  [id number]  Date Initiated:  09/03/2012  Documentation initiated by:  Vance Peper  Subjective/Objective Assessment:   67 yr old female s/p  radical I & D of infected right total knee arthroplasty.     Action/Plan:   Patient is active with [place]. Has DME.   Anticipated DC Date:     Anticipated DC Plan:  HOME W HOME HEALTH SERVICES      DC Planning Services  CM consult      [facility] Choice  Resumption Of Svcs/PTA Provider   Choice offered to / List presented to:             [facility] agency  [facility]   Status of service:  In process, will continue to follow Medicare Important Message given?   (If response is "NO", the following Medicare IM given date fields will be blank) Date Medicare IM given:   Date Additional Medicare IM given:    Discharge Disposition:    Per UR Regulation:    If discussed at Long Length of Stay Meetings, dates discussed:    Comments:

## 2012-09-03 NOTE — Progress Notes (Signed)
SW will await OT/PT evaluations to determine discharge disposition.  Sabino Niemann, MSW, [phone]

## 2012-09-03 NOTE — Anesthesia Procedure Notes (Signed)
Procedure Name: Intubation Date/Time: 09/03/2012 11:20 AM Performed by: Quentin Ore Pre-anesthesia Checklist: Patient identified, Emergency Drugs available, Suction available, Patient being monitored and Timeout performed Patient Re-evaluated:Patient Re-evaluated prior to inductionOxygen Delivery Method: Circle system utilized Preoxygenation: Pre-oxygenation with 100% oxygen Intubation Type: IV induction Ventilation: Mask ventilation without difficulty Laryngoscope Size: Mac and 3 Grade View: Grade I Tube type: Oral Tube size: 7.0 mm Number of attempts: 1 Airway Equipment and Method: Stylet and LTA kit utilized Placement Confirmation: ETT inserted through vocal cords under direct vision,  positive ETCO2 and breath sounds checked- equal and bilateral Secured at: 21 cm Tube secured with: Tape Dental Injury: Teeth and Oropharynx as per pre-operative assessment

## 2012-09-03 NOTE — Anesthesia Preprocedure Evaluation (Addendum)
Anesthesia Evaluation  Patient identified by MRN, date of birth, ID band Patient awake    Reviewed: Allergy & Precautions, H&P , NPO status , Patient's Chart, lab work & pertinent test results, reviewed documented beta blocker date and time   Airway Mallampati: II TM Distance: >3 FB Neck ROM: full    Dental  (+) Teeth Intact   Pulmonary neg pulmonary ROS,  breath sounds clear to auscultation        Cardiovascular hypertension, On Medications and On Home Beta Blockers Rhythm:regular     Neuro/Psych  Headaches, negative psych ROS   GI/Hepatic negative GI ROS, Neg liver ROS,   Endo/Other  diabetes, Oral Hypoglycemic Agents  Renal/GU Renal disease  negative genitourinary   Musculoskeletal   Abdominal   Peds  Hematology  (+) anemia ,   Anesthesia Other Findings See surgeon's H&P   Reproductive/Obstetrics negative OB ROS                          Anesthesia Physical Anesthesia Plan  ASA: III  Anesthesia Plan: General   Post-op Pain Management:    Induction: Intravenous  Airway Management Planned: Oral ETT  Additional Equipment:   Intra-op Plan:   Post-operative Plan: Extubation in OR  Informed Consent: I have reviewed the patients History and Physical, chart, labs and discussed the procedure including the risks, benefits and alternatives for the proposed anesthesia with the patient or authorized representative who has indicated his/her understanding and acceptance.   Dental Advisory Given  Plan Discussed with: CRNA and Surgeon  Anesthesia Plan Comments:         Anesthesia Quick Evaluation

## 2012-09-04 ENCOUNTER — Inpatient Hospital Stay (HOSPITAL_COMMUNITY): Payer: Medicare Other

## 2012-09-04 DIAGNOSIS — D649 Anemia, unspecified: Secondary | ICD-10-CM

## 2012-09-04 LAB — CBC
HCT: 19.2 % — ABNORMAL LOW (ref 36.0–46.0)
Hemoglobin: 6.6 g/dL — CL (ref 12.0–15.0)
MCH: 30.8 pg (ref 26.0–34.0)
MCHC: 34.4 g/dL (ref 30.0–36.0)
MCV: 89.7 fL (ref 78.0–100.0)
Platelets: 334 10*3/uL (ref 150–400)
RBC: 2.14 MIL/uL — ABNORMAL LOW (ref 3.87–5.11)
RDW: 17.5 % — ABNORMAL HIGH (ref 11.5–15.5)
WBC: 6.3 10*3/uL (ref 4.0–10.5)

## 2012-09-04 LAB — BASIC METABOLIC PANEL
BUN: 19 mg/dL (ref 6–23)
CO2: 24 mEq/L (ref 19–32)
Calcium: 9.4 mg/dL (ref 8.4–10.5)
Chloride: 98 mEq/L (ref 96–112)
Creatinine, Ser: 2.07 mg/dL — ABNORMAL HIGH (ref 0.50–1.10)
GFR calc Af Amer: 28 mL/min — ABNORMAL LOW (ref >90)
GFR calc non Af Amer: 24 mL/min — ABNORMAL LOW (ref >90)
Glucose, Bld: 175 mg/dL — ABNORMAL HIGH (ref 70–99)
Potassium: 4.5 mEq/L (ref 3.5–5.1)
Sodium: 135 mEq/L (ref 135–145)

## 2012-09-04 LAB — PREPARE RBC (CROSSMATCH)

## 2012-09-04 LAB — BODY FLUID CULTURE: Culture: NO GROWTH

## 2012-09-04 LAB — GLUCOSE, CAPILLARY
Glucose-Capillary: 125 mg/dL — ABNORMAL HIGH (ref 70–99)
Glucose-Capillary: 125 mg/dL — ABNORMAL HIGH (ref 70–99)
Glucose-Capillary: 59 mg/dL — ABNORMAL LOW (ref 70–99)
Glucose-Capillary: 66 mg/dL — ABNORMAL LOW (ref 70–99)
Glucose-Capillary: 54 mg/dL — ABNORMAL LOW (ref 70–99)
Glucose-Capillary: 59 mg/dL — ABNORMAL LOW (ref 70–99)
Glucose-Capillary: 67 mg/dL — ABNORMAL LOW (ref 70–99)

## 2012-09-04 LAB — VANCOMYCIN, RANDOM: Vancomycin Rm: 47 ug/mL

## 2012-09-04 MED ORDER — SODIUM CHLORIDE 0.9 % IV SOLN
INTRAVENOUS | Status: DC
  Administered 2012-09-04: 22:00:00 via INTRAVENOUS

## 2012-09-04 MED ORDER — SODIUM CHLORIDE 0.9 % IJ SOLN
10.0000 mL | INTRAMUSCULAR | Status: DC | PRN
  Administered 2012-09-04: 10 mL
  Administered 2012-09-04 – 2012-09-05 (×4): 20 mL
  Administered 2012-09-06 – 2012-09-08 (×4): 10 mL

## 2012-09-04 MED ORDER — SODIUM CHLORIDE 0.9 % IJ SOLN
10.0000 mL | Freq: Two times a day (BID) | INTRAMUSCULAR | Status: DC
Start: 2012-09-04 — End: 2012-09-08
  Administered 2012-09-06: 20 mL
  Administered 2012-09-06: 10 mL

## 2012-09-04 NOTE — Progress Notes (Signed)
Called to place TLC for medication, blood and volume replacement.  Hgb 6.5 s/p R Knee replacement.  Pt in no acute distress.  Labs reviewed, consent obtained, questions answered.     Canary Brim, NP-C [facility] Pgr: [phone] or [phone]

## 2012-09-04 NOTE — Progress Notes (Signed)
Peripherally Inserted Central Catheter/Midline Placement  The IV Nurse has discussed with the patient and/or persons authorized to consent for the patient, the purpose of this procedure and the potential benefits and risks involved with this procedure.  The benefits include less needle sticks, lab draws from the catheter and patient may be discharged home with the catheter.  Risks include, but not limited to, infection, bleeding, blood clot (thrombus formation), and puncture of an artery; nerve damage and irregular heat beat.  Alternatives to this procedure were also discussed.  PICC/Midline Placement Documentation     Unable to thread guidwire in Left basilic or brachial vein, Pt to go to IR for PICC placement.   Ronie Spies [facility] 09/04/2012, 11:56 AM

## 2012-09-04 NOTE — Progress Notes (Signed)
PT Cancellation Note  Patient Details Name: [name] MRN: [id number] DOB: [birth year]   Cancelled Treatment:    Reason Eval/Treat Not Completed: Medical issues which prohibited therapy (Hgb 6.6. Awaiting blood transfusion and PICC placement ) Attempted to see pt this AM and at that point she stated she was feeling poorly. We discussed waiting until after blood transfusion to work with PT and pt agreed.  Pt now awaiting PICC line placement in IR prior to receiving blood. Pt did get up with NT this AM per RN.  Will defer eval until tomorrow.   Donnella Sham 09/04/2012, 1:56 PM Lavona Mound, PT  [phone] 09/04/2012

## 2012-09-04 NOTE — Progress Notes (Signed)
Spoke with Interventional Radiology Md concerning time frame of placement of picc line for pt . IR advised Rn that they are unable to place picc line at this time of day. Alternative measures are being explored. Kayla PA is aware and is to follow up with Md regarding possible critical care placement or central line placement. Pt is asymptomatic and is alert and oriented. Will continue to monitor pt and follow up with continuing plan.

## 2012-09-04 NOTE — Progress Notes (Signed)
IV team reports unsuccessful attempt to place picc line. Pt does not currently have any iv access. Blood is currently on hold until picc line is place. Interventional Radiology notified for new orders to place picc line. Pt alert and oriented and asymptymatic of current  h/h.

## 2012-09-04 NOTE — Progress Notes (Signed)
Pt has been unable to obtain picc line and is being sent to IR for placement. Pt is pending 2U of PRBC's and is noted to have HGB 6.6, Cr 2.07 (baseline 0.95 09/01/2012). Pt in need of IV fluids, normal saline prior to and post 2U of blood as soon as IV access is obtained. Repeat CBC post transfusion and repeat CBC and BMP in the AM.

## 2012-09-04 NOTE — Progress Notes (Signed)
1 day S/P R TKR washout with poly exchange by Dr Turner Daniels. Pt doing well overall, c/o fatigue and mild lightheadedness. Moderate R knee pain controlled on current pain medications. Still awaiting pic line, followed by ID.   BP 141/64  Pulse 71  Temp(Src) 98.7 F (37.1 C) (Oral)  Resp 18  Ht 4\' 11"  (1.499 m)  Wt 72.576 kg (160 lb)  BMI 32.3 kg/m2  SpO2 100% Drain output=70 since placement  HGB 6.6 (down from 8.2 on 4/17), HCT 19.2  Pt laying in hospital bed, appears very drowsy, R knee immobilizer in place, dressing CDI, Drains in place, SCD's in place. - Homans, 2+ DPP = BIL.   1 day S/P R TKR washout with poly exchange by Dr Turner Daniels.  -HGB 6.6, transfuse 2U PRBC's today   -Continue ABX per Dr Drue Second in ID: cont vanco, rifampin, ceftriaxone   -pic to be placed   - No CPM  - R Knee Immobilizer at all times  - Drains will likely come out Monday   - Cont SCD's and ASA BID   - Cont current px meds (norco with IV dilaudid PRN breakthrough)

## 2012-09-04 NOTE — Progress Notes (Signed)
Pt experiencing nausea and is unable to eat or drink sweet food items. CBG 59. zofran given and gingerale. cbg 54. Orange juice given. Pt having less nausea. cbg 67. Pt fell asleep. Rechecked cbg when second unit of PRBC's started. CBG 48. Pt was able to drink 240 cc [employer]. cbg 70

## 2012-09-04 NOTE — Progress Notes (Signed)
OT Cancellation Note  Patient Details Name: [name] MRN: [id number] DOB: 1946-02-01   Cancelled Treatment:    Reason Eval/Treat Not Completed: Medical issues which prohibited therapy. Hgb 6.6. Awaiting blood transfusion and PICC placement   Attempted to check on pt this afternoon and PICC had still not been placed and at that point she stated she was feeling poorly. Pt now awaiting PICC line placement for receiving blood. Will defer OT eval until tomorrow.      Galen Manila, OT 09/04/2012, 2:46 PM

## 2012-09-04 NOTE — Progress Notes (Signed)
Hypoglycemic Event  CBG:67   Treatment: 15 GM carbohydrate snack  Symptoms: None  Follow-up CBG: Time:0050 CBG Result:48  Possible Reasons for Event: Inadequate meal intake  Comments/MD notified:md notified    [name] A  Remember to initiate Hypoglycemia Order Set & complete

## 2012-09-04 NOTE — Progress Notes (Signed)
ANTIBIOTIC CONSULT NOTE - FOLLOW UP  Pharmacy Consult for Vancomycin Indication: Infected total joint  Allergies  Allergen Reactions  . Metformin Other (See Comments)    REACTION, diarrhea: 850 mg - diarrhea but tolerates 500 mg daily    Patient Measurements: Height: 4\' 11"  (149.9 cm) Weight: 160 lb (72.576 kg) IBW/kg (Calculated) : 43.2 Adjusted Body Weight:   Vital Signs: Temp: 98.6 F (37 C) (05/03 2100) Temp src: Oral (05/03 2100) BP: 118/50 mmHg (05/03 2100) Pulse Rate: 66 (05/03 2100) Intake/Output from previous day: 05/02 0701 - 05/03 0700 In: 700 [I.V.:700] Out: 1520 [Urine:1425; Drains:70; Blood:25] Intake/Output from this shift: Total I/O In: 120 [P.O.:120] Out: -   Labs:  Recent Labs  09/03/12 1711 09/04/12 0510  WBC  --  6.3  HGB 7.0* 6.6*  PLT  --  334  CREATININE  --  2.07*   Estimated Creatinine Clearance: 23.5 ml/min (by C-G formula based on Cr of 2.07).  Recent Labs  09/04/12 2100  VANCORANDOM 47.0     Microbiology: Recent Results (from the past 720 hour(s))  SURGICAL PCR SCREEN     Status: None   Collection Time    08/09/12 11:52 AM      Result Value Range Status   MRSA, PCR NEGATIVE  NEGATIVE Final   Staphylococcus aureus NEGATIVE  NEGATIVE Final   Comment:            The Xpert SA Assay (FDA     approved for NASAL specimens     in patients over 1 years of age),     is one component of     a comprehensive surveillance     program.  Test performance has     been validated by [employer] for patients greater     than or equal to 34 year old.     It is not intended     to diagnose infection nor to     guide or monitor treatment.  URINE CULTURE     Status: None   Collection Time    08/09/12 11:55 AM      Result Value Range Status   Specimen Description URINE, CLEAN CATCH   Final   Special Requests NONE   Final   Culture  Setup Time 08/09/2012 13:04   Final   Colony Count 25,000 COLONIES/ML   Final   Culture     Final    Value: Multiple bacterial morphotypes present, none predominant. Suggest appropriate recollection if clinically indicated.   Report Status 08/10/2012 FINAL   Final  GRAM STAIN     Status: None   Collection Time    09/01/12  7:30 AM      Result Value Range Status   Specimen Description FLUID SYNOVIAL RIGHT KNEE   Final   Special Requests PATIENT ON FOLLOWING VANCO   Final   Gram Stain     Final   Value: RARE WBC PRESENT, PREDOMINANTLY PMN     NO ORGANISMS SEEN     VERBAL REPORT GIVEN TO DR. Turner Daniels 09/01/12 0715   Report Status 09/01/2012 FINAL   Final  BODY FLUID CULTURE     Status: None   Collection Time    09/01/12  7:30 AM      Result Value Range Status   Specimen Description FLUID SYNOVIAL RIGHT KNEE   Final   Special Requests PATIENT ON FOLLOWING VANCO   Final   Gram Stain     Final  Value: ABUNDANT WBC PRESENT, PREDOMINANTLY PMN     NO ORGANISMS SEEN   Culture NO GROWTH 3 DAYS   Final   Report Status 09/04/2012 FINAL   Final  WOUND CULTURE     Status: None   Collection Time    09/03/12 12:08 PM      Result Value Range Status   Specimen Description WOUND   Final   Special Requests RIGHT TOTAL KNEE PT ON VANCO   Final   Gram Stain PENDING   Incomplete   Culture     Final   Value: FEW STAPHYLOCOCCUS AUREUS     Note: RIFAMPIN AND GENTAMICIN SHOULD NOT BE USED AS SINGLE DRUGS FOR TREATMENT OF STAPH INFECTIONS.   Report Status PENDING   Incomplete  ANAEROBIC CULTURE     Status: None   Collection Time    09/03/12 12:08 PM      Result Value Range Status   Specimen Description WOUND   Final   Special Requests RIGHT TOTAL KNEE PT ON VANCO   Final   Gram Stain PENDING   Incomplete   Culture     Final   Value: NO ANAEROBES ISOLATED; CULTURE IN PROGRESS FOR 5 DAYS   Report Status PENDING   Incomplete    Anti-infectives   Start     Dose/Rate Route Frequency Ordered Stop   09/03/12 1800  rifampin (RIFADIN) capsule 300 mg     300 mg Oral Daily 09/03/12 1551     09/03/12 1730   cefTRIAXone (ROCEPHIN) 1 g in dextrose 5 % 50 mL IVPB     1 g 100 mL/hr over 30 Minutes Intravenous Every 24 hours 09/03/12 1551     09/03/12 1600  vancomycin (VANCOCIN) IVPB 1000 mg/200 mL premix  Status:  Discontinued     1,000 mg 200 mL/hr over 60 Minutes Intravenous Every 12 hours 09/03/12 1556 09/03/12 1559   09/03/12 1157  gentamicin (GARAMYCIN) injection  Status:  Discontinued       As needed 09/03/12 1200 09/03/12 1302   09/03/12 1150  vancomycin (VANCOCIN) powder  Status:  Discontinued       As needed 09/03/12 1157 09/03/12 1302   09/01/12 0600  ceFAZolin (ANCEF) IVPB 2 g/50 mL premix     2 g 100 mL/hr over 30 Minutes Intravenous On call to O.R. 08/31/12 2007 09/02/12 0559   08/31/12 1400  vancomycin (VANCOCIN) IVPB 1000 mg/200 mL premix  Status:  Discontinued     1,000 mg 200 mL/hr over 60 Minutes Intravenous Every 8 hours 08/31/12 1204 09/04/12 1022      Assessment: Ms. Northway is a 67 yo BF s/p R TKR on 08/16/12 now with drainage and blisters along her incision unresponsive to Keflex.. Admit to start abx on infected total joint  Infectious Disease: s/p right TKR on 08/16/12; developed drainage from incision site and was on keflex PTA; Vanco for wound infection and ? Wound infection spreading to joint. 5/2 to OR s/p I&D w/ removal and replacement of polyethylene bearing and placement of gent and vanc beads. Tmax 99.2. WBC 6.3 down. Scr has jumped from 0.95 (4/30) to 2.07 today!  4/29 Vanco Rx>>  5/3 0600 Last dose  5/2 2100 Random level>>47 5/3 Rifampin>> 4/20: Knee fluid gram stain>> pending   Goal of Therapy:  Vancomycin trough level 15-20 mcg/ml  Plan:  Continue to hold Vancomycin doses Reassess level in 48 hrs to evaluate elimination.  Misty Stanley Stillinger 09/04/2012,9:54 PM

## 2012-09-04 NOTE — Procedures (Signed)
Central Venous Catheter Insertion Procedure Note JANYAH SINGLETERRY [id number] 10-07-1945  Procedure: Insertion of Central Venous Catheter Indications: Assessment of intravascular volume, Drug and/or fluid administration and Frequent blood sampling  Procedure Details Consent: Risks of procedure as well as the alternatives and risks of each were explained to the (patient/caregiver).  Consent for procedure obtained. Time Out: Verified patient identification, verified procedure, site/side was marked, verified correct patient position, special equipment/implants available, medications/allergies/relevent history reviewed, required imaging and test results available.  Performed  Maximum sterile technique was used including antiseptics, cap, gloves, gown, hand hygiene, mask and sheet. Skin prep: Chlorhexidine; local anesthetic administered A triple lumen catheter was placed in the left internal jugular vein using the Seldinger technique to 17 cm (pt 4'10)  Evaluation Blood flow good Complications: No apparent complications Patient did tolerate procedure well. Chest X-ray ordered to verify placement.  CXR: pending.  Procedure performed under direct supervision of Dr. Marin Shutter and with ultrasound guidance for real time vessel cannulation.     Canary Brim, NP-C [facility] Pgr: [phone] or [phone]  09/04/2012, 3:26 PM  Supervised and was present through the entire procedure.  Lonia Farber, MD Pulmonary and Critical Care Medicine [facility] Pager: [phone]  09/04/2012, 4:49 PM

## 2012-09-04 NOTE — Progress Notes (Signed)
Called after checking for results of chest/xray for central line placement has not been read yet , called tech(Tess) [phone]  Waiting for results to make sure of placement before giving blood note for 1645

## 2012-09-04 NOTE — Progress Notes (Signed)
Hypoglycemic Event  CBG:59   Treatment: 15 GM carbohydrate snack  Symptoms: None  Follow-up CBG: Time:2221 CBG Result:54  Possible Reasons for Event: Inadequate meal intake  Comments/MD notified:med given for nausea/pt eating more    [name] A  Remember to initiate Hypoglycemia Order Set & complete

## 2012-09-04 NOTE — Progress Notes (Signed)
    [facility]    Date of Admission:  08/31/2012   Total days of antibiotics 5        Day 5 vanco        Day 2 ceftriaxone        Day 1 rifampin   ID: [name] is a 67 y.o. female with probably septic right prosthetic knee infection s/p I x D and polyexchange, cultures pending, now has AKI  Principal Problem:   Infection of prosthetic knee joint    Subjective: afebrile  Medications:  . anastrozole  1 mg Oral QHS  . aspirin EC  325 mg Oral Q breakfast  . cefTRIAXone (ROCEPHIN)  IV  1 g Intravenous Q24H  . celecoxib  200 mg Oral Q12H  . docusate sodium  100 mg Oral BID  . glipiZIDE  10 mg Oral BID AC  . hydrochlorothiazide  25 mg Oral Daily  . insulin aspart  0-15 Units Subcutaneous TID WC  . lisinopril  40 mg Oral Daily  . metFORMIN  500 mg Oral BID WC  . metoprolol succinate  25 mg Oral Daily  . rifampin  300 mg Oral Daily  . senna  1 tablet Oral BID  . simvastatin  20 mg Oral q1800    Objective: Vital signs in last 24 hours: Temp:  [98.3 F (36.8 C)-99.2 F (37.3 C)] 98.7 F (37.1 C) (05/03 0500) Pulse Rate:  [60-75] 71 (05/03 0500) Resp:  [9-28] 18 (05/03 0500) BP: (131-159)/(52-85) 141/64 mmHg (05/03 0500) SpO2:  [96 %-100 %] 100 % (05/03 0500) General appearance: alert, cooperative and moderate distress  Eyes: negative findings: pupils equal, round, reactive to light and accomodation  Throat: normal findings: oropharynx pink & moist without lesions or evidence of thrush  Neck: no adenopathy and supple, symmetrical, trachea midline  Lungs: clear to auscultation bilaterally  Heart: regular rate and rhythm  Abdomen: normal findings: bowel sounds normal and soft, non-tender  Extremities: RLE wrapped  Neurologic: Sensory: normal, groslly nl light touch RLE      Lab Results  Recent Labs  09/03/12 1711 09/04/12 0510  WBC  --  6.3  HGB 7.0* 6.6*  HCT 20.8* 19.2*  NA  --  135  K  --  4.5  CL  --  98  CO2  --  24  BUN   --  19  CREATININE  --  2.07*    Microbiology: 5/2 culture pending 4/30 synovial fluid : NGTD Studies/Results: No results found.   Assessment/Plan: Probable Right TKA prosthetic joint infection = continue with vancomycin, ceftriaxone, and rifampin for now until culture results return. No recent vanco level. I will add on vanco level to today's lab. i have discontinued her vancomycin since i think she will be supratherapeutic since her Cr doubled from 0.9 to 2.07. I will defer to pharmacy to adjust accoridng liy.  aki = will check vanco level. Likely needs some fluid hydration and avoid nephrotox drugs. Would check Cr CL tomorrow and may need to have renal consult if not improved.   Drue Second [facility] for Infectious Diseases Cell: [phone] Pager: [phone]  09/04/2012, 10:23 AM

## 2012-09-05 ENCOUNTER — Inpatient Hospital Stay (HOSPITAL_COMMUNITY): Payer: Medicare Other

## 2012-09-05 LAB — BASIC METABOLIC PANEL
BUN: 21 mg/dL (ref 6–23)
CO2: 27 mEq/L (ref 19–32)
Calcium: 9 mg/dL (ref 8.4–10.5)
Chloride: 102 mEq/L (ref 96–112)
Creatinine, Ser: 2.26 mg/dL — ABNORMAL HIGH (ref 0.50–1.10)
GFR calc Af Amer: 25 mL/min — ABNORMAL LOW (ref >90)
GFR calc non Af Amer: 22 mL/min — ABNORMAL LOW (ref >90)
Glucose, Bld: 136 mg/dL — ABNORMAL HIGH (ref 70–99)
Potassium: 3.9 mEq/L (ref 3.5–5.1)
Sodium: 137 mEq/L (ref 135–145)

## 2012-09-05 LAB — URINALYSIS, ROUTINE W REFLEX MICROSCOPIC
Bilirubin Urine: NEGATIVE
Glucose, UA: NEGATIVE mg/dL
Hgb urine dipstick: NEGATIVE
Ketones, ur: NEGATIVE mg/dL
Leukocytes, UA: NEGATIVE
Nitrite: NEGATIVE
Protein, ur: NEGATIVE mg/dL
Specific Gravity, Urine: 1.007 (ref 1.005–1.030)
Urobilinogen, UA: 1 mg/dL (ref 0.0–1.0)
pH: 5 (ref 5.0–8.0)

## 2012-09-05 LAB — CBC
HCT: 24.8 % — ABNORMAL LOW (ref 36.0–46.0)
Hemoglobin: 8.6 g/dL — ABNORMAL LOW (ref 12.0–15.0)
MCH: 29.2 pg (ref 26.0–34.0)
MCHC: 34.7 g/dL (ref 30.0–36.0)
MCV: 84.1 fL (ref 78.0–100.0)
Platelets: 281 10*3/uL (ref 150–400)
RBC: 2.95 MIL/uL — ABNORMAL LOW (ref 3.87–5.11)
RDW: 18.4 % — ABNORMAL HIGH (ref 11.5–15.5)
WBC: 6.1 10*3/uL (ref 4.0–10.5)

## 2012-09-05 LAB — NA AND K (SODIUM & POTASSIUM), RAND UR
Potassium Urine: 10 mEq/L
Sodium, Ur: 35 mEq/L

## 2012-09-05 LAB — GLUCOSE, CAPILLARY
Glucose-Capillary: 101 mg/dL — ABNORMAL HIGH (ref 70–99)
Glucose-Capillary: 112 mg/dL — ABNORMAL HIGH (ref 70–99)
Glucose-Capillary: 218 mg/dL — ABNORMAL HIGH (ref 70–99)
Glucose-Capillary: 48 mg/dL — ABNORMAL LOW (ref 70–99)
Glucose-Capillary: 56 mg/dL — ABNORMAL LOW (ref 70–99)
Glucose-Capillary: 70 mg/dL (ref 70–99)
Glucose-Capillary: 95 mg/dL (ref 70–99)

## 2012-09-05 LAB — CREATININE, URINE, RANDOM: Creatinine, Urine: 21.29 mg/dL

## 2012-09-05 LAB — CHLORIDE, URINE, RANDOM: Chloride Urine: 33 mEq/L

## 2012-09-05 MED ORDER — SODIUM CHLORIDE 0.9 % IV BOLUS (SEPSIS): 1000.0000 mL | Freq: Once | INTRAVENOUS | Status: DC

## 2012-09-05 MED ORDER — PROMETHAZINE HCL 25 MG/ML IJ SOLN
12.5000 mg | Freq: Four times a day (QID) | INTRAMUSCULAR | Status: DC | PRN
  Administered 2012-09-05: 12.5 mg via INTRAVENOUS
  Filled 2012-09-05: qty 1

## 2012-09-05 MED ORDER — PROMETHAZINE HCL 12.5 MG PO TABS: 12.5000 mg | ORAL_TABLET | Freq: Four times a day (QID) | ORAL | Status: DC | PRN

## 2012-09-05 MED ORDER — SENNA 8.6 MG PO TABS
1.0000 | ORAL_TABLET | Freq: Two times a day (BID) | ORAL | Status: DC
  Administered 2012-09-05 – 2012-09-07 (×4): 8.6 mg via ORAL
  Filled 2012-09-05 (×8): qty 1

## 2012-09-05 MED ORDER — CEFAZOLIN SODIUM-DEXTROSE 2-3 GM-% IV SOLR
2.0000 g | Freq: Two times a day (BID) | INTRAVENOUS | Status: DC
  Filled 2012-09-05: qty 50

## 2012-09-05 MED ORDER — DEXTROSE 50 % IV SOLN
INTRAVENOUS | Status: AC
  Administered 2012-09-05: 50 mL
  Filled 2012-09-05: qty 50

## 2012-09-05 MED ORDER — SODIUM CHLORIDE 0.9 % IV BOLUS (SEPSIS)
500.0000 mL | Freq: Once | INTRAVENOUS | Status: AC
  Administered 2012-09-05: 500 mL via INTRAVENOUS

## 2012-09-05 MED ORDER — PROMETHAZINE HCL 25 MG RE SUPP: 12.5000 mg | Freq: Four times a day (QID) | RECTAL | Status: DC | PRN

## 2012-09-05 MED ORDER — SODIUM CHLORIDE 0.9 % IV BOLUS (SEPSIS)
1000.0000 mL | Freq: Once | INTRAVENOUS | Status: AC
  Administered 2012-09-05: 1000 mL via INTRAVENOUS

## 2012-09-05 NOTE — Progress Notes (Signed)
Hypoglycemic Event  CBG: 48 @0045   Treatment: 15 GM carbohydrate snack  Symptoms: None  Follow-up CBG: Time:0130 CBG Result:70  Possible Reasons for Event: Inadequate meal intake  Comments/MD notified:pt comfortable/continues to drink pepsi    Dilon Lank A  Remember to initiate Hypoglycemia Order Set & complete

## 2012-09-05 NOTE — Evaluation (Signed)
Physical Therapy Evaluation Patient Details Name: [name] MRN: [id number] DOB: 01-Oct-1945 Today's Date: 09/05/2012 Time: 4098-1191 PT Time Calculation (min): 18 min  PT Assessment / Plan / Recommendation Clinical Impression    This patient underwent a right TKA with subsequent infection and underwent I&D with poly exchange.  Now presents to PT with anticipated post-op decrease in strength and functional mobility.  Pt. Will benefit from acute PT to address these and hopefully allow for DC back to home.      PT Assessment  Patient needs continued PT services    Follow Up Recommendations  Home health PT;Supervision - Intermittent    Does the patient have the potential to tolerate intense rehabilitation      Barriers to Discharge        Equipment Recommendations  None recommended by PT    Recommendations for Other Services     Frequency Min 3X/week    Precautions / Restrictions Precautions Required Braces or Orthoses: Knee Immobilizer - Right Knee Immobilizer - Right: On at all times Restrictions Weight Bearing Restrictions: Yes RLE Weight Bearing: Weight bearing as tolerated Other Position/Activity Restrictions: no ROM exercies with RLE, no CPM   Pertinent Vitals/Pain 3/10 right knee pain      Mobility  Bed Mobility Bed Mobility: Sit to Supine Sit to Supine: 4: Min assist;HOB flat (assist with RLE) Transfers Transfers: Sit to Stand;Stand to Sit Sit to Stand: 5: Supervision;From chair/3-in-1 Stand to Sit: 5: Supervision;To bed;To chair/3-in-1;Without upper extremity assist Details for Transfer Assistance: pt used commode during session and could get up/down without UE assist Ambulation/Gait Ambulation/Gait Assistance: 5: Supervision Ambulation Distance (Feet): 30 Feet Assistive device: Rolling walker Ambulation/Gait Assistance Details: distance limited secondary to nausea Gait Pattern: Step-to pattern;Decreased stride length Gait velocity: decreased     Exercises Total Joint Exercises Ankle Circles/Pumps: AROM;10 reps;Both;Seated   PT Diagnosis: Difficulty walking;Acute pain  PT Problem List: Decreased strength;Decreased range of motion;Decreased activity tolerance;Decreased balance;Decreased mobility;Decreased knowledge of use of DME;Decreased knowledge of precautions;Pain PT Treatment Interventions: DME instruction;Gait training;Patient/family education   PT Goals Acute Rehab PT Goals PT Goal Formulation: With patient Time For Goal Achievement: 09/12/12 Potential to Achieve Goals: Good Pt will go Supine/Side to Sit: Independently;with HOB 0 degrees PT Goal: Supine/Side to Sit - Progress: Goal set today Pt will go Sit to Supine/Side: Independently;with HOB 0 degrees PT Goal: Sit to Supine/Side - Progress: Goal set today Pt will go Sit to Stand: with modified independence PT Goal: Sit to Stand - Progress: Progressing toward goal Pt will Ambulate: 51 - 150 feet;with modified independence;with rolling walker PT Goal: Ambulate - Progress: Goal set today  Visit Information  Last PT Received On: 09/05/12 Assistance Needed: +1    Subjective Data  Subjective: pt reports nausea Patient Stated Goal: to feel better   Prior Functioning  Home Living Lives With: Spouse Available Help at Discharge: Family;Neighbor;Available 24 hours/day Type of Home: House Home Access: Stairs to enter [employer] of Steps: 3 Entrance Stairs-Rails: None Home Layout: Two level;Able to live on main level with bedroom/bathroom Bathroom Shower/Tub: Engineer, manufacturing systems: Standard Bathroom Accessibility: Yes How Accessible: Accessible via walker Home Adaptive Equipment: Bedside commode/3-in-1;Walker - rolling;Shower chair with back Prior Function Level of Independence: Independent (prior to TKA) Able to Take Stairs?: Yes Driving: Yes Vocation: Retired Musician: No difficulties    Agricultural engineer Arousal/Alertness: Awake/alert Behavior During Therapy: WFL for tasks assessed/performed Overall Cognitive Status: Within Functional Limits for tasks assessed  Extremity/Trunk Assessment Right Upper Extremity Assessment RUE ROM/Strength/Tone: [facility] for tasks assessed Left Upper Extremity Assessment LUE ROM/Strength/Tone: [facility] for tasks assessed Right Lower Extremity Assessment RLE ROM/Strength/Tone: Deficits;Unable to fully assess;Due to precautions RLE ROM/Strength/Tone Deficits:  (pt in knee immobilzer with orders for no knee ROM) Left Lower Extremity Assessment LLE ROM/Strength/Tone: [facility] for tasks assessed Trunk Assessment Trunk Assessment: Normal   Balance    End of Session PT - End of Session Equipment Utilized During Treatment: Gait belt Activity Tolerance: Patient tolerated treatment well;Other (comment) (limited ambultation by nausea) Patient left: in bed;with call bell/phone within reach Nurse Communication: Mobility status;Other (comment) (nausea)  GP     Donnella Sham 09/05/2012, 11:52 AM [place], PT  [phone] 09/05/2012

## 2012-09-05 NOTE — Consult Note (Signed)
Hospital Consult Note Date: 09/05/2012  Patient name: [name] Medical record number: [id number] Date of birth: 06-10-1945 Age: 67 y.o. Gender: female PCP: Stacy Gardner, MD  Medical Service: orthopedic surgery Attending name: Dr. Turner Daniels  Reason for consult: continued Cr rise in setting of MRSA bacteremia   History of Present Illness: [name] is a 67 yo woman pmh of uncontrolled DM, recent right knee replacement on 08-16-12 presented on 08/31/12 with worsening right knee pain and visible purulence. Pt was then taken back to the OR 08/31/12 for washout and poly exchange. Pt was never hypotensive but has significant anorexia and decreased po intake. BCx have grown MRSA and pt was started on IV Vanc, Rifampin, and Ceftriaxone (7 days of IV Vanc and 4 days of Ceftriaxone, 2 days Rifampin). Pt has been afebrile during stay. Acute blood loss anemia that required transfusion on 09/04/12. No active sites of bleeding.    Meds: Medications Prior to Admission  Medication Sig Dispense Refill  . anastrozole (ARIMIDEX) 1 MG tablet Take 1 mg by mouth at bedtime.      Marland Kitchen aspirin EC 325 MG EC tablet Take 1 tablet (325 mg total) by mouth daily with breakfast.  30 tablet  0  . diphenhydrAMINE (BENADRYL) 25 mg capsule Take 1 capsule (25 mg total) by mouth every 4 (four) hours as needed for itching.  24 capsule  2  . glipiZIDE (GLUCOTROL) 10 MG tablet Take 1 tablet (10 mg total) by mouth 2 (two) times daily before a meal.  90 tablet  1  . hydrochlorothiazide (HYDRODIURIL) 25 MG tablet Take 1 tablet (25 mg total) by mouth daily.  90 tablet  1  . HYDROmorphone (DILAUDID) 2 MG tablet Take 1-2 tablets (2-4 mg total) by mouth every 4 (four) hours as needed.  60 tablet  0  . lisinopril (PRINIVIL,ZESTRIL) 40 MG tablet Take 1 tablet (40 mg total) by mouth daily.  90 tablet  1  . metFORMIN (GLUCOPHAGE) 500 MG tablet Take 1 tablet (500 mg total) by mouth 2 (two) times daily with a meal.  180 tablet  1  . metoprolol  succinate (TOPROL XL) 25 MG 24 hr tablet Take 1 tablet (25 mg total) by mouth daily.  30 tablet  5  . Polyvinyl Alcohol-Povidone (REFRESH OP) Place 1 drop into both eyes daily as needed. Dry eyes      . pravastatin (PRAVACHOL) 40 MG tablet Take 1 tablet (40 mg total) by mouth at bedtime.  90 tablet  1  . traMADol (ULTRAM) 50 MG tablet Take 50 mg by mouth every 6 (six) hours as needed for pain.        Allergies: Metformin Past Medical History  Diagnosis Date  . Allergy   . Hypertension   . Hyperlipidemia   . Fatty liver disease, nonalcoholic   . History of hyperkalemia   . Obesity   . Renal lesion   . Hx antineoplastic chemo  10/12 - 06/24/11    PACLITAX EL COMPLETED 06/24/11  . Anemia   . Diabetes mellitus ORAL MED  . OA (osteoarthritis) of knee     RIGHT LEG  . Mild nonproliferative diabetic retinopathy of right eye 03/19/11    Dr. Norva Pavlov  . Breast CA     (Rt) breast ca dx 4/12---  S/P RADICAL MASTECTOMY AND CHEMORADIATION  . Numbness and tingling     Hx: of in fingers and toes since chemotherapy   Past Surgical History  Procedure Laterality Date  .  Knee arthroscopy  10-28-1999    RIGHT  . Mastectomy modified radical  12-04-2010    W/ LEFT PAC PLACEMENT (RIGHT BREAST W/ AXILLARY CONTENTS/ NODE BX'S  . Transthoracic echocardiogram  10-29-2010    NORMAL LVF/ EF 55-60%/ MILD MV REGURG  . Vaginal hysterectomy  AGE 9    W/ LSO  . Tubal ligation  YRS AGO  . Laparoscopy with laparoscopic right salpingo oophorectomy and lysis of pelvic and abdominal adhesions  March 2013    Dr. Jennette Kettle   . Portacath placement      REPLACED [facility] DUE TO MALFUNCTION (LEFT)  . Port-a-cath removal  09/24/2011    Procedure: REMOVAL PORT-A-CATH;  Surgeon: Clovis Pu. Cornett, MD;  Location: [facility];  Service: General;  Laterality: N/A;  . Colonoscopy w/ polypectomy      Hx; of  . Total knee arthroplasty Right 08/16/2012    Procedure: TOTAL KNEE ARTHROPLASTY;  Surgeon: [name], MD;  Location:  [facility] OR;  Service: Orthopedics;  Laterality: Right;   Family History  Problem Relation Age of Onset  . Stroke Father   . Heart disease Sister   . Cancer Sister     breast  . Heart disease Brother   . Cancer Sister     COLON CANCER   History   Social History  . Marital Status: Married    Spouse Name: N/A    Number of Children: N/A  . Years of Education: N/A   Occupational History  . Not on file.   Social History Main Topics  . Smoking status: Never Smoker   . Smokeless tobacco: Never Used  . Alcohol Use: No  . Drug Use: No  . Sexually Active: Yes    Birth Control/ Protection: Surgical     Comment: MENARCHE AGE 17, G4, P4, PARITY AGE 46, HRT 20+ YEARS     Other Topics Concern  . 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  Patient needs to submit further paperwork to complete- Rudell Cobb 08/14/09.   Financial assistance application completed.  Patient does not qualify for assistance-over income Rudell Cobb 09/12/09.             Review of Systems: Pertinent listed in HPI. Pt continues to be nauseous and was having some suprapubic pain before admission, denied dysuria or pyuria.   Physical Exam: Blood pressure 142/69, pulse 82, temperature 97.6 F (36.4 C), temperature source Oral, resp. rate 20, height 4\' 11"  (1.499 m), weight 158 lb 1.6 oz (71.714 kg), SpO2 100.00%. General: resting in bed, some acute distress  HEENT: PERRL, EOMI, no scleral icterus, dry MM  Cardiac: RRR, no rubs, murmurs or gallops  Pulm: clear to auscultation bilaterally, moving normal volumes of air  Abd: soft, nontender, nondistended, BS present  Ext: warm and well perfused, no pedal edema, right total knee immobilizer in place with wound vac draining bloody serosanguinous fluid  Neuro: alert and oriented X3, cranial nerves II-XII  grossly intact   Lab results: Basic Metabolic Panel:  Recent Labs  57/84/69 0510 09/05/12 1000  NA 135 137  K 4.5 3.9  CL 98 102  CO2 24 27  GLUCOSE 175* 136*  BUN 19 21  CREATININE 2.07* 2.26*  CALCIUM 9.4 9.0   CBC:  Recent Labs  09/04/12 0510  09/05/12 1000  WBC 6.3 6.1  HGB 6.6* 8.6*  HCT 19.2* 24.8*  MCV 89.7 84.1  PLT 334 281   CBG:  Recent Labs  09/04/12 2303 09/05/12 0050 09/05/12 0127 09/05/12 0634 09/05/12 0700 09/05/12 1131  GLUCAP 67* 48* 70 56* 218* 101*   Imaging results:  Dg Chest Port 1 View  09/04/2012  *RADIOLOGY REPORT*  Clinical Data: Evaluate central line placement  PORTABLE CHEST - 1 VIEW  Comparison: Prior chest x-ray 08/09/2012  Findings: Interval placement of a left IJ approach central venous catheter.  The catheter tip projects over the upper superior vena cava.  No evidence of pneumothorax.  Surgical changes of prior right mastectomy and axillary nodal dissection.  Cardiac and mediastinal contours are within normal limits.  The lungs are clear.  Degenerative changes again noted throughout the thoracic spine.  Query bilateral cervical ribs.  IMPRESSION: The tip of the left IJ catheter projects over the upper superior vena cava.  No evidence of complicating pneumothorax.   Original Report Authenticated By: Malachy Moan, M.D.     Assessment & Plan by Problem: 1.Acute Renal failure likely multifactorial: Pt appears baseline Cr 0.8-0.9 and steady increase during admission today 2.26 from 2.07 on 09/04/12. This is in the setting of NSAID use (celecoxib), diuretic use, DM, HTN, Vanc, anemia, bacteremia, ?hx of renal lesion, and decreased PO intake. Therefore pre-renal, AIN, ATN, or even obstructive could be etiologies. Pt appears dry on exam, d/c of possible offending agents, and labs along with imaging all ordered.  -strict I&O, daily weights  -1500 cc NS bolus  -UA with UCx  -bladder scan  -renal US  -d/c celocoxib, HCTZ, lisinopril, and  glyburide  -avoid NSAIDS suggest opiates for pain relief -FeNa, FeUrea  -daily bmet   2. HTN: stable. Continue to monitor -d/c lisinopril and HCTZ -prn hydralazine  Signed: Christen Bame, MD PGY-1 Pgr: [phone] 09/05/2012, 2:31 PM

## 2012-09-05 NOTE — Progress Notes (Addendum)
Consult placed regarding elevated Cr despite aggressive fluid hydration. Pt has been followed by [facility] in the past and they have been consulted, Dr Jenetta Loges to evaluate the pt today regarding cr elevation.

## 2012-09-05 NOTE — Anesthesia Postprocedure Evaluation (Signed)
Anesthesia Post Note  Patient: [name]  Procedure(s) Performed: Procedure(s) (LRB): IRRIGATION AND DEBRIDEMENT RIGHT KNEE WITH POLY EXCHANGE (Right)  Anesthesia type: General  Patient location: PACU  Post pain: Pain level controlled  Post assessment: Patient's Cardiovascular Status Stable  Last Vitals:  Filed Vitals:   09/05/12 1627  BP:   Pulse:   Temp:   Resp: 16    Post vital signs: Reviewed and stable  Level of consciousness: alert  Complications: No apparent anesthesia complications

## 2012-09-05 NOTE — Progress Notes (Signed)
Patient ID: [name], female   DOB: 1946-04-29, 67 y.o.   MRN: [id number] PATIENT ID: NEDRA MCINNIS  MRN: [id number]  DOB/AGE:  1946/02/02 / 67 y.o.  2 Days Post-Op Procedure(s) (LRB): IRRIGATION AND DEBRIDEMENT RIGHT KNEE WITH POLY EXCHANGE (Right)    PROGRESS NOTE Subjective: Patient is alert, oriented, mild Nausea, no Vomiting, yes passing gas, no Bowel Movement. Taking PO sips,Blood glucose has been in the 50s and I have encouraged patient to drink Pepsi and eat food if possible Denies SOB, Chest or Calf Pain. Using Incentive Spirometer, PAS in place. Ambulate In the room with a knee immobilizer at all times, No CPM Patient reports pain as 3 on 0-10 scale,Pain is less than it was preoperatively.Infectious diseases has evaluated the patient and she is presently on vancomycin, ceftriaxone, and rifampin.    Objective: Vital signs in last 24 hours: Filed Vitals:   09/05/12 0200 09/05/12 0310 09/05/12 0401 09/05/12 0639  BP: 131/55 142/66 124/69 133/65  Pulse: 83 79 83 81  Temp: 98.6 F (37 C) 99 F (37.2 C) 98.7 F (37.1 C) 98.5 F (36.9 C)  TempSrc:  Oral  Oral  Resp: 14 20  18   Height:      Weight:      SpO2:   100% 99%      Intake/Output from previous day: I/O last 3 completed shifts: In: 2940 [P.O.:1080; I.V.:1150; Blood:700; Other:10] Out: 1130 [Urine:1100; Drains:30]   Intake/Output this shift:     LABORATORY DATA:  Recent Labs  09/03/12 1711  09/04/12 0510  09/05/12 0127 09/05/12 0634 09/05/12 0700  WBC  --   --  6.3  --   --   --   --   HGB 7.0*  --  6.6*  --   --   --   --   HCT 20.8*  --  19.2*  --   --   --   --   PLT  --   --  334  --   --   --   --   NA  --   --  135  --   --   --   --   K  --   --  4.5  --   --   --   --   CL  --   --  98  --   --   --   --   CO2  --   --  24  --   --   --   --   BUN  --   --  19  --   --   --   --   CREATININE  --   --  2.07*  --   --   --   --   GLUCOSE  --   --  175*  --   --   --   --   GLUCAP  --    < >  --   < > 70 56* 218*  CALCIUM  --   --  9.4  --   --   --   --   < > = values in this interval not displayed. Preliminary results of tissue cultures from surgery are staph aureus.Sensitivities pending  Examination: Neurologically intact ABD soft Neurovascular intact Sensation intact distally Intact pulses distally Dorsiflexion/Plantar flexion intact Incision: scant drainage No cellulitis present Compartment soft}  Assessment:   2 Days Post-Op Procedure(s) (LRB): IRRIGATION AND DEBRIDEMENT RIGHT KNEE  WITH POLY EXCHANGE (Right) ADDITIONAL DIAGNOSIS:  Acute Blood Loss Anemia, Diabetes, Hypertension and Renal Insufficiency Acute  Plan: 1. Will check the results of today's labs, specifically, hemoglobin and BMET. 2. Antibiotics per infectious diseases.  Based on culture results, central line has been placed and anticipate 6 weeks of IV antibiotics.  Anticipate removing drains tomorrow if drainage remains minimal. 3. DVT Prophylaxis:  SCDx72hrs, ASA 325 mg BID x 2 weeks,Continue knee immobilizer to minimize chance of any hematoma or fluid collection  DISCHARGE PLAN: Home DISCHARGE NEEDS: HHPT, HHRN, Walker, 3-in-1 comode seat and IV Antibiotics     Ranesha Val J 09/05/2012, 8:24 AM

## 2012-09-05 NOTE — Progress Notes (Signed)
2 Day's S/P R TKR washout with poly exchange by Dr Turner Daniels. Pt c/o nausea this am, has been unable to take her PO meds yet, tolerating water but no solids. Fatigue/lightheadedness resolved. Pt reports feeling somewhat overwhelmed secondary to central line placement and blood transfusion yesterday and is eager for husband to visit. Unable to eat much food and blood sugars had been running low, D5 given and pt encouraged to eat.  Mild 3/10 R knee pain controlled on current pain medications. ABX followed by ID. -SOB/-Ch px, -calf pain  BP 133/65  Pulse 81  Temp(Src) 98.5 F (36.9 C) (Oral)  Resp 20  Ht 4\' 11"  (1.499 m)  Wt 72.576 kg (160 lb)  BMI 32.3 kg/m2  SpO2 98% Drain output minimal =10 over last 24 hours  HGB improved to 8.6 from 6.6 yesterday Cr: increased to 2.26 from 2.07 yesterday and 0.95 prior to admission  Pt sitting up in chair sipping water, central line in place, moderately anxious and uncomfortable due to nausea. No vomiting, + passing gas, - BM,  dressing CDI, Drains in place, SCD's in place. - Homans, 2+ DPP = BIL.   2 day's S/P R TKR washout with poly exchange by Dr Turner Daniels.   -Acute blood loss anemia   -HGB increased to 8.6 from 6.6 after 2U RBC's given yesterday, no longer symptomatic  -R septic prosthetic knee   -Continue ABX per Dr Drue Second in ID: cont vanco, rifampin, ceftriaxone    -Vanc supratherapeutic at 47, addressed by ID   -Cx has come back s. aureus MRSA, ID to be alerted  -Acute Renal Insufficiency    -Cr increased from 2.07 yesterday to 2.26 today, 0.95 PTA   -Lisinipril has been d/c'd,    -Consult to renal, potential d/c additional nephrotoxic medications   - S/P TKR with washout.poly exchange   - R Knee Immobilizer at all times    - Drains will likely come out Monday    - Cont SCD's and ASA BID    - Cont current px meds (norco with IV dilaudid PRN breakthrough)  -Nausea ongoing   -Likely secondary to ABX therapy and ARI    -Continue anti-nausea, order  placed for phenergan    -Rifampin changed to with meals per ID

## 2012-09-05 NOTE — Progress Notes (Signed)
Hypoglycemic Event  CBG: 56  Treatment: D50 IV 50 mL  Symptoms: None  Follow-up CBG: Time:0700 CBG Result:218  Possible Reasons for Event: Inadequate meal intake  Comments/MD notified:dr rowan discussed with pt need to eat/agreed to D50 50ml since pt refusing po glucose    [name]  Remember to initiate Hypoglycemia Order Set & complete

## 2012-09-05 NOTE — Progress Notes (Addendum)
[facility]    Date of Admission:  08/31/2012   Total days of antibiotics 6        Day 6 vanco (held on 5/3 due to AKI, elevate vanco level)        Day 3 ceftriaxone        Day 2 rifampin   ID: [name] is a 67 y.o. female with probably septic right prosthetic knee infection s/p I x D and polyexchange, cultures pending, now has AKI  Principal Problem:   Infection of prosthetic knee joint    Subjective: Afebrile; nausea, poor po intake  Medications:  . anastrozole  1 mg Oral QHS  . aspirin EC  325 mg Oral Q breakfast  . cefTRIAXone (ROCEPHIN)  IV  1 g Intravenous Q24H  . celecoxib  200 mg Oral Q12H  . docusate sodium  100 mg Oral BID  . glipiZIDE  10 mg Oral BID AC  . hydrochlorothiazide  25 mg Oral Daily  . insulin aspart  0-15 Units Subcutaneous TID WC  . lisinopril  40 mg Oral Daily  . metoprolol succinate  25 mg Oral Daily  . rifampin  300 mg Oral Daily  . senna  1 tablet Oral BID  . simvastatin  20 mg Oral q1800  . sodium chloride  10-40 mL Intracatheter Q12H    Objective: Vital signs in last 24 hours: Temp:  [98.5 F (36.9 C)-99.7 F (37.6 C)] 98.5 F (36.9 C) (05/04 0639) Pulse Rate:  [66-95] 81 (05/04 0639) Resp:  [14-20] 20 (05/04 0850) BP: (114-142)/(49-69) 133/65 mmHg (05/04 0639) SpO2:  [98 %-100 %] 98 % (05/04 0850) General appearance: alert, cooperative , fatigue appearing, but no distress Eyes: negative findings: pupils equal, round, reactive to light and accomodation  Throat: normal findings: oropharynx pink & moist without lesions or evidence of thrush  Neck: no adenopathy and supple, symmetrical, trachea midline  Lungs: clear to auscultation bilaterally  Heart: regular rate and rhythm  Abdomen: normal findings: bowel sounds normal and soft, non-tender  Extremities: RLE wrapped , accordian drain in place      Lab Results  Recent Labs  09/04/12 0510 09/05/12 1000  WBC 6.3 6.1  HGB 6.6* 8.6*  HCT  19.2* 24.8*  NA 135  --   K 4.5  --   CL 98  --   CO2 24  --   BUN 19  --   CREATININE 2.07*  --     Microbiology: 5/2 culture: few staph aureus (MRSA) 4/30 synovial fluid : NGTD Studies/Results: Dg Chest Port 1 View  09/04/2012  *RADIOLOGY REPORT*  Clinical Data: Evaluate central line placement  PORTABLE CHEST - 1 VIEW  Comparison: Prior chest x-ray 08/09/2012  Findings: Interval placement of a left IJ approach central venous catheter.  The catheter tip projects over the upper superior vena cava.  No evidence of pneumothorax.  Surgical changes of prior right mastectomy and axillary nodal dissection.  Cardiac and mediastinal contours are within normal limits.  The lungs are clear.  Degenerative changes again noted throughout the thoracic spine.  Query bilateral cervical ribs.  IMPRESSION: The tip of the left IJ catheter projects over the upper superior vena cava.  No evidence of complicating pneumothorax.   Original Report Authenticated By: Malachy Moan, M.D.      Assessment/Plan: MRSA Right TKA prosthetic joint infection = continue with current regimen of vanco and rifampin. Will discontinue ceftriaxone. Will schedule rifampin with meals to see  if that helps with nausea vs. Giving anti-emetic. Will need 6 wks of IV therapy, then convert to oral regimen of keflex for 6 months of therapy  aki = today's Cr is pending. If continue to rise( with Cr > 2) would consult renal. will give fluid bolus of to see if that helps creatinine function.  Likely due to supratherapeutic vanco, random level at 47. Will order urine electrolyte. Will discontinue lisinopril 40mg  daily for now until reassessment of Cr function. Also may need to consider d/c other potentially nephrotoxic drugs  supratherapeutic vanco level = pharmacy is monitoring, next level tomorrow to determine clearance.   Malaise/nausea = may also be due to AKI.    Drue Second [facility] Cell:  [phone] Pager: [phone]  09/05/2012, 10:26 AM

## 2012-09-06 ENCOUNTER — Encounter (HOSPITAL_COMMUNITY): Payer: Self-pay | Admitting: Orthopedic Surgery

## 2012-09-06 DIAGNOSIS — N179 Acute kidney failure, unspecified: Secondary | ICD-10-CM

## 2012-09-06 DIAGNOSIS — A4902 Methicillin resistant Staphylococcus aureus infection, unspecified site: Secondary | ICD-10-CM

## 2012-09-06 DIAGNOSIS — Z96659 Presence of unspecified artificial knee joint: Secondary | ICD-10-CM

## 2012-09-06 DIAGNOSIS — M009 Pyogenic arthritis, unspecified: Secondary | ICD-10-CM

## 2012-09-06 DIAGNOSIS — Z5189 Encounter for other specified aftercare: Secondary | ICD-10-CM

## 2012-09-06 DIAGNOSIS — T8450XA Infection and inflammatory reaction due to unspecified internal joint prosthesis, initial encounter: Principal | ICD-10-CM

## 2012-09-06 LAB — TYPE AND SCREEN
ABO/RH(D): O POS
Antibody Screen: NEGATIVE
Unit division: 0
Unit division: 0

## 2012-09-06 LAB — WOUND CULTURE: Gram Stain: NONE SEEN

## 2012-09-06 LAB — IRON AND TIBC
Iron: 27 ug/dL — ABNORMAL LOW (ref 42–135)
Saturation Ratios: 14 % — ABNORMAL LOW (ref 20–55)
TIBC: 195 ug/dL — ABNORMAL LOW (ref 250–470)
UIBC: 168 ug/dL (ref 125–400)

## 2012-09-06 LAB — BASIC METABOLIC PANEL
BUN: 16 mg/dL (ref 6–23)
CO2: 24 mEq/L (ref 19–32)
Calcium: 8.7 mg/dL (ref 8.4–10.5)
Chloride: 108 mEq/L (ref 96–112)
Creatinine, Ser: 2.17 mg/dL — ABNORMAL HIGH (ref 0.50–1.10)
GFR calc Af Amer: 26 mL/min — ABNORMAL LOW (ref >90)
GFR calc non Af Amer: 23 mL/min — ABNORMAL LOW (ref >90)
Glucose, Bld: 110 mg/dL — ABNORMAL HIGH (ref 70–99)
Potassium: 4 mEq/L (ref 3.5–5.1)
Sodium: 141 mEq/L (ref 135–145)

## 2012-09-06 LAB — VITAMIN B12: Vitamin B-12: 687 pg/mL (ref 211–911)

## 2012-09-06 LAB — CBC
HCT: 21.1 % — ABNORMAL LOW (ref 36.0–46.0)
Hemoglobin: 7.5 g/dL — ABNORMAL LOW (ref 12.0–15.0)
MCH: 29.9 pg (ref 26.0–34.0)
MCHC: 35.5 g/dL (ref 30.0–36.0)
MCV: 84.1 fL (ref 78.0–100.0)
Platelets: 254 10*3/uL (ref 150–400)
RBC: 2.51 MIL/uL — ABNORMAL LOW (ref 3.87–5.11)
RDW: 18.9 % — ABNORMAL HIGH (ref 11.5–15.5)
WBC: 5.3 10*3/uL (ref 4.0–10.5)

## 2012-09-06 LAB — FOLATE: Folate: 19.8 ng/mL

## 2012-09-06 LAB — GLUCOSE, CAPILLARY
Glucose-Capillary: 111 mg/dL — ABNORMAL HIGH (ref 70–99)
Glucose-Capillary: 139 mg/dL — ABNORMAL HIGH (ref 70–99)
Glucose-Capillary: 128 mg/dL — ABNORMAL HIGH (ref 70–99)
Glucose-Capillary: 100 mg/dL — ABNORMAL HIGH (ref 70–99)

## 2012-09-06 LAB — RETICULOCYTES
RBC.: 2.7 MIL/uL — ABNORMAL LOW (ref 3.87–5.11)
Retic Count, Absolute: 32.4 10*3/uL (ref 19.0–186.0)
Retic Ct Pct: 1.2 % (ref 0.4–3.1)

## 2012-09-06 LAB — FERRITIN: Ferritin: 732 ng/mL — ABNORMAL HIGH (ref 10–291)

## 2012-09-06 LAB — UREA NITROGEN, URINE: Urea Nitrogen, Ur: 129 mg/dL

## 2012-09-06 LAB — VANCOMYCIN, RANDOM: Vancomycin Rm: 27.8 ug/mL

## 2012-09-06 NOTE — Clinical Social Work Placement (Addendum)
Clinical Social Work Department  CLINICAL SOCIAL WORK PLACEMENT NOTE  09/06/2012  Patient: [name]  Account Number: [id number] Admit date: 08/31/2012 Clinical Social Worker: Sabino Niemann MSWDate/time: 09/06/2012 11:30 AM  Clinical Social Work is seeking post-discharge placement for this patient at the following level of care: SKILLED NURSING (*CSW will update this form in Epic as items are completed)  09/06/2012 Patient/family provided with [facility] Department of Clinical Social Work's list of facilities offering this level of care within the geographic area requested by the patient (or if unable, by the patient's family).  09/06/2012 Patient/family informed of their freedom to choose among providers that offer the needed level of care, that participate in Medicare, Medicaid or managed care program needed by the patient, have an available bed and are willing to accept the patient.  5/5/2014Patient/family informed of [facility]' ownership interest in [facility], as well as of the fact that they are under no obligation to receive care at this facility.  PASARR submitted to EDS on  PASARR number received from EDS on  FL2 transmitted to all facilities in geographic area requested by pt/family on 09/06/2012  FL2 transmitted to all facilities within larger geographic area on  Patient informed that his/her managed care company has contracts with or will negotiate with certain facilities, including the following:  Patient/family informed of bed offers received: 04/12/12  Patient chooses bed at [facility] Physician recommends and patient chooses bed at  Patient to be transferred to on 09/08/2012 Patient to be transferred to facility by [facility] The following physician request were entered in Epic:  Additional Comments:

## 2012-09-06 NOTE — Care Management Note (Signed)
CARE MANAGEMENT NOTE 09/06/2012  Patient:  [name], [name]   Account Number:  [id number]  Date Initiated:  09/03/2012  Documentation initiated by:  Vance Peper  Subjective/Objective Assessment:   67 yr old female s/p  radical I & D of infected right total knee arthroplasty.     Action/Plan:   Patient is active with [place]. Has DME.    5/5- patient will need SNF for shortterm rehab. Social Worker Amy Riley Kill is aware.   Anticipated DC Date:  09/06/2012   Anticipated DC Plan:  SKILLED NURSING FACILITY      DC Planning Services  CM consult      [facility] Choice  Resumption Of Svcs/PTA Provider   Choice offered to / List presented to:             [facility] agency  [facility]   Status of service:  Completed, signed off Medicare Important Message given?   (If response is "NO", the following Medicare IM given date fields will be blank) Date Medicare IM given:   Date Additional Medicare IM given:    Discharge Disposition:  SKILLED NURSING FACILITY  Per UR Regulation:    If discussed at Long Length of Stay Meetings, dates discussed:    Comments:

## 2012-09-06 NOTE — Progress Notes (Signed)
INFECTIOUS DISEASE PROGRESS NOTE  ID: [name] is a 67 y.o. female with  Principal Problem:   Infection of prosthetic knee joint  Subjective: Without complaints, would like to get back in bed.  Not able to get [facility], has L IJ now.   Abtx:  Anti-infectives   Start     Dose/Rate Route Frequency Ordered Stop   09/05/12 1200  ceFAZolin (ANCEF) IVPB 2 g/50 mL premix  Status:  Discontinued     2 g 100 mL/hr over 30 Minutes Intravenous Every 12 hours 09/05/12 1034 09/05/12 1303   09/03/12 1800  rifampin (RIFADIN) capsule 300 mg     300 mg Oral Daily 09/03/12 1551     09/03/12 1730  cefTRIAXone (ROCEPHIN) 1 g in dextrose 5 % 50 mL IVPB  Status:  Discontinued     1 g 100 mL/hr over 30 Minutes Intravenous Every 24 hours 09/03/12 1551 09/05/12 1034   09/03/12 1600  vancomycin (VANCOCIN) IVPB 1000 mg/200 mL premix  Status:  Discontinued     1,000 mg 200 mL/hr over 60 Minutes Intravenous Every 12 hours 09/03/12 1556 09/03/12 1559   09/03/12 1157  gentamicin (GARAMYCIN) injection  Status:  Discontinued       As needed 09/03/12 1200 09/03/12 1302   09/03/12 1150  vancomycin (VANCOCIN) powder  Status:  Discontinued       As needed 09/03/12 1157 09/03/12 1302   09/01/12 0600  ceFAZolin (ANCEF) IVPB 2 g/50 mL premix     2 g 100 mL/hr over 30 Minutes Intravenous On call to O.R. 08/31/12 2007 09/02/12 0559   08/31/12 1400  vancomycin (VANCOCIN) IVPB 1000 mg/200 mL premix  Status:  Discontinued     1,000 mg 200 mL/hr over 60 Minutes Intravenous Every 8 hours 08/31/12 1204 09/04/12 1022      Medications:  Scheduled: . anastrozole  1 mg Oral QHS  . aspirin EC  325 mg Oral Q breakfast  . docusate sodium  100 mg Oral BID  . insulin aspart  0-15 Units Subcutaneous TID WC  . metoprolol succinate  25 mg Oral Daily  . rifampin  300 mg Oral Daily  . senna  1 tablet Oral BID WC  . simvastatin  20 mg Oral q1800  . sodium chloride  1,000 mL Intravenous Once  . sodium chloride  10-40 mL  Intracatheter Q12H    Objective: Vital signs in last 24 hours: Temp:  [97.6 F (36.4 C)-99 F (37.2 C)] 98.1 F (36.7 C) (05/05 0530) Pulse Rate:  [75-99] 79 (05/05 1027) Resp:  [16-20] 18 (05/05 0800) BP: (116-142)/(58-69) 116/58 mmHg (05/05 0530) SpO2:  [97 %-100 %] 97 % (05/05 0800) Weight:  [71.714 kg (158 lb 1.6 oz)] 71.714 kg (158 lb 1.6 oz) (05/04 1403)   General appearance: alert, cooperative and no distress Extremities: RLE wrapped.   Lab Results  Recent Labs  09/05/12 1000 09/06/12 0500  WBC 6.1 5.3  HGB 8.6* 7.5*  HCT 24.8* 21.1*  NA 137 141  K 3.9 4.0  CL 102 108  CO2 27 24  BUN 21 16  CREATININE 2.26* 2.17*   Liver Panel No results found for this basename: PROT, ALBUMIN, AST, ALT, ALKPHOS, BILITOT, BILIDIR, IBILI,  in the last 72 hours Sedimentation Rate No results found for this basename: ESRSEDRATE,  in the last 72 hours C-Reactive Protein No results found for this basename: CRP,  in the last 72 hours  Microbiology: Recent Results (from the past 240 hour(s))  GRAM STAIN     Status: None   Collection Time    09/01/12  7:30 AM      Result Value Range Status   Specimen Description FLUID SYNOVIAL RIGHT KNEE   Final   Special Requests PATIENT ON FOLLOWING VANCO   Final   Gram Stain     Final   Value: RARE WBC PRESENT, PREDOMINANTLY PMN     NO ORGANISMS SEEN     VERBAL REPORT GIVEN TO DR. Turner Daniels 09/01/12 0715   Report Status 09/01/2012 FINAL   Final  BODY FLUID CULTURE     Status: None   Collection Time    09/01/12  7:30 AM      Result Value Range Status   Specimen Description FLUID SYNOVIAL RIGHT KNEE   Final   Special Requests PATIENT ON FOLLOWING VANCO   Final   Gram Stain     Final   Value: ABUNDANT WBC PRESENT, PREDOMINANTLY PMN     NO ORGANISMS SEEN   Culture NO GROWTH 3 DAYS   Final   Report Status 09/04/2012 FINAL   Final  WOUND CULTURE     Status: None   Collection Time    09/03/12 12:08 PM      Result Value Range Status   Specimen  Description WOUND   Final   Special Requests RIGHT TOTAL KNEE PT ON VANCO   Final   Gram Stain PENDING   Incomplete   Culture     Final   Value: FEW METHICILLIN RESISTANT STAPHYLOCOCCUS AUREUS     Note: RIFAMPIN AND GENTAMICIN SHOULD NOT BE USED AS SINGLE DRUGS FOR TREATMENT OF STAPH INFECTIONS. CRITICAL RESULT CALLED TO, READ BACK BY AND VERIFIED WITH: TONYA B@1055  ON [id number] BY [facility]   Report Status PENDING   Incomplete   Organism ID, Bacteria METHICILLIN RESISTANT STAPHYLOCOCCUS AUREUS   Final  ANAEROBIC CULTURE     Status: None   Collection Time    09/03/12 12:08 PM      Result Value Range Status   Specimen Description WOUND   Final   Special Requests RIGHT TOTAL KNEE PT ON VANCO   Final   Gram Stain PENDING   Incomplete   Culture     Final   Value: NO ANAEROBES ISOLATED; CULTURE IN PROGRESS FOR 5 DAYS   Report Status PENDING   Incomplete    Studies/Results: US Renal  09/05/2012  *RADIOLOGY REPORT*  Clinical Data: Acute renal failure.  History of diabetes, hypertension and hysterectomy.  RENAL/URINARY TRACT ULTRASOUND COMPLETE  Comparison:  Abdominal CT 04/20/2007.  PET CT 10/09/2010.  Findings:  Right Kidney:  Mild prominence of the right renal pelvis is stable. There is no hydronephrosis.  Renal cortical thickness and echogenicity are preserved.  There is no focal abnormality.  Renal length 10.4 cm.  Left Kidney:  No hydronephrosis.  Well-preserved cortex.  Normal size and parenchymal echotexture without focal abnormalities. Renal length 11.2 cm.  Bladder:  Unremarkable.  Bilateral ureteral jets are noted.  IMPRESSION: Normal renal ultrasound.  No hydronephrosis.   Original Report Authenticated By: Carey Bullocks, M.D.    Dg Chest Port 1 View  09/04/2012  *RADIOLOGY REPORT*  Clinical Data: Evaluate central line placement  PORTABLE CHEST - 1 VIEW  Comparison: Prior chest x-ray 08/09/2012  Findings: Interval placement of a left IJ approach central venous catheter.  The catheter tip projects  over the upper superior vena cava.  No evidence of pneumothorax.  Surgical changes of prior right mastectomy and  axillary nodal dissection.  Cardiac and mediastinal contours are within normal limits.  The lungs are clear.  Degenerative changes again noted throughout the thoracic spine.  Query bilateral cervical ribs.  IMPRESSION: The tip of the left IJ catheter projects over the upper superior vena cava.  No evidence of complicating pneumothorax.   Original Report Authenticated By: Malachy Moan, M.D.      Assessment/Plan: Septic Arthritis/MRSA ARF DM  Total days of antibiotics: vanco/rifampin day 7 Cr slightly better, now off NSAIDS, ACE-I.  Her vanco is on hold, will not give further doses til her Cr normalizes.  My great appreciation to the outstanding eval by Dr Burtis Junes and to pharmacy for their assistance with vanco dosing.  IR for PIC?         [name] Infectious Diseases [phone] 09/06/2012, 11:24 AM   LOS: 6 days

## 2012-09-06 NOTE — Progress Notes (Addendum)
Hospital Consult Note Date: 09/06/2012  Patient name: [name] Medical record number: [id number] Date of birth: [birth year] Age: 67 y.o. Gender: female PCP: Stacy Gardner, MD  Medical Service: orthopedic surgery Attending name: Dr. Turner Daniels  Reason for consult: continued Cr rise in setting of MRSA bacteremia   History of Present Illness: Ms. Revels is a 67 yo woman pmh of uncontrolled DM, recent right knee replacement on 08-16-12 presented on 08/31/12 with worsening right knee pain and visible purulence. Pt was then taken back to the OR 08/31/12 for washout and poly exchange. Pt was never hypotensive but has significant anorexia and decreased po intake. BCx have grown MRSA and pt was started on IV Vanc, Rifampin, and Ceftriaxone (7 days of IV Vanc and 4 days of Ceftriaxone, 2 days Rifampin). Pt has been afebrile during stay. Acute blood loss anemia that required transfusion on 09/04/12. No active sites of bleeding.    Meds: Medications Prior to Admission  Medication Sig Dispense Refill  . anastrozole (ARIMIDEX) 1 MG tablet Take 1 mg by mouth at bedtime.      Marland Kitchen aspirin EC 325 MG EC tablet Take 1 tablet (325 mg total) by mouth daily with breakfast.  30 tablet  0  . diphenhydrAMINE (BENADRYL) 25 mg capsule Take 1 capsule (25 mg total) by mouth every 4 (four) hours as needed for itching.  24 capsule  2  . glipiZIDE (GLUCOTROL) 10 MG tablet Take 1 tablet (10 mg total) by mouth 2 (two) times daily before a meal.  90 tablet  1  . hydrochlorothiazide (HYDRODIURIL) 25 MG tablet Take 1 tablet (25 mg total) by mouth daily.  90 tablet  1  . HYDROmorphone (DILAUDID) 2 MG tablet Take 1-2 tablets (2-4 mg total) by mouth every 4 (four) hours as needed.  60 tablet  0  . lisinopril (PRINIVIL,ZESTRIL) 40 MG tablet Take 1 tablet (40 mg total) by mouth daily.  90 tablet  1  . metFORMIN (GLUCOPHAGE) 500 MG tablet Take 1 tablet (500 mg total) by mouth 2 (two) times daily with a meal.  180 tablet  1  . metoprolol  succinate (TOPROL XL) 25 MG 24 hr tablet Take 1 tablet (25 mg total) by mouth daily.  30 tablet  5  . Polyvinyl Alcohol-Povidone (REFRESH OP) Place 1 drop into both eyes daily as needed. Dry eyes      . pravastatin (PRAVACHOL) 40 MG tablet Take 1 tablet (40 mg total) by mouth at bedtime.  90 tablet  1  . traMADol (ULTRAM) 50 MG tablet Take 50 mg by mouth every 6 (six) hours as needed for pain.        Allergies: Metformin Past Medical History  Diagnosis Date  . Allergy   . Hypertension   . Hyperlipidemia   . Fatty liver disease, nonalcoholic   . History of hyperkalemia   . Obesity   . Renal lesion   . Hx antineoplastic chemo  10/12 - 06/24/11    PACLITAX EL COMPLETED 06/24/11  . Anemia   . Diabetes mellitus ORAL MED  . OA (osteoarthritis) of knee     RIGHT LEG  . Mild nonproliferative diabetic retinopathy of right eye 03/19/11    Dr. Norva Pavlov  . Breast CA     (Rt) breast ca dx 4/12---  S/P RADICAL MASTECTOMY AND CHEMORADIATION  . Numbness and tingling     Hx: of in fingers and toes since chemotherapy   Past Surgical History  Procedure Laterality Date  .  Knee arthroscopy  10-28-1999    RIGHT  . Mastectomy modified radical  12-04-2010    W/ LEFT PAC PLACEMENT (RIGHT BREAST W/ AXILLARY CONTENTS/ NODE BX'S  . Transthoracic echocardiogram  10-29-2010    NORMAL LVF/ EF 55-60%/ MILD MV REGURG  . Vaginal hysterectomy  AGE 70    W/ LSO  . Tubal ligation  YRS AGO  . Laparoscopy with laparoscopic right salpingo oophorectomy and lysis of pelvic and abdominal adhesions  March 2013    Dr. Jennette Kettle   . Portacath placement      REPLACED [facility] DUE TO MALFUNCTION (LEFT)  . Port-a-cath removal  09/24/2011    Procedure: REMOVAL PORT-A-CATH;  Surgeon: Clovis Pu. Cornett, MD;  Location: [facility] ORS;  Service: General;  Laterality: N/A;  . Colonoscopy w/ polypectomy      Hx; of  . Total knee arthroplasty Right 08/16/2012    Procedure: TOTAL KNEE ARTHROPLASTY;  Surgeon: [name], MD;  Location:  [facility] OR;  Service: Orthopedics;  Laterality: Right;   Family History  Problem Relation Age of Onset  . Stroke Father   . Heart disease Sister   . Cancer Sister     breast  . Heart disease Brother   . Cancer Sister     COLON CANCER   History   Social History  . Marital Status: Married    Spouse Name: N/A    Number of Children: N/A  . Years of Education: N/A   Occupational History  . Not on file.   Social History Main Topics  . Smoking status: Never Smoker   . Smokeless tobacco: Never Used  . Alcohol Use: No  . Drug Use: No  . Sexually Active: Yes    Birth Control/ Protection: Surgical     Comment: MENARCHE AGE 37, G4, P4, PARITY AGE 67, HRT 20+ YEARS     Other Topics Concern  . 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  Patient needs to submit further paperwork to complete- Rudell Cobb 08/14/09.   Financial assistance application completed.  Patient does not qualify for assistance-over income Rudell Cobb 09/12/09.             Review of Systems: Pertinent listed in HPI. Pt continues to be nauseous and was having some suprapubic pain before admission, denied dysuria or pyuria.   Physical Exam: Blood pressure 116/58, pulse 75, temperature 98.1 F (36.7 C), temperature source Oral, resp. rate 20, height 4\' 11"  (1.499 m), weight 158 lb 1.6 oz (71.714 kg), SpO2 99.00%. General: resting in bed, some acute distress  HEENT: PERRL, EOMI, no scleral icterus, dry MM  Cardiac: RRR, no rubs, murmurs or gallops  Pulm: clear to auscultation bilaterally, moving normal volumes of air  Abd: soft, nontender, nondistended, BS present  Ext: warm and well perfused, no pedal edema, right total knee immobilizer in place with wound vac draining bloody serosanguinous fluid  Neuro: alert and oriented X3, cranial nerves II-XII  grossly intact  Lab results: Basic Metabolic Panel:  Recent Labs  96/04/54 1000 09/06/12 0500  NA 137 141  K 3.9 4.0  CL 102 108  CO2 27 24  GLUCOSE 136* 110*  BUN 21 16  CREATININE 2.26* 2.17*  CALCIUM 9.0 8.7   CBC:  Recent Labs  09/05/12 1000 09/06/12  0500  WBC 6.1 5.3  HGB 8.6* 7.5*  HCT 24.8* 21.1*  MCV 84.1 84.1  PLT 281 254   CBG:  Recent Labs  09/05/12 0634 09/05/12 0700 09/05/12 1131 09/05/12 1648 09/05/12 2213 09/06/12 0712  GLUCAP 56* 218* 101* 95 112* 111*   Imaging results:  US Renal  09/05/2012  *RADIOLOGY REPORT*  Clinical Data: Acute renal failure.  History of diabetes, hypertension and hysterectomy.  RENAL/URINARY TRACT ULTRASOUND COMPLETE  Comparison:  Abdominal CT 04/20/2007.  PET CT 10/09/2010.  Findings:  Right Kidney:  Mild prominence of the right renal pelvis is stable. There is no hydronephrosis.  Renal cortical thickness and echogenicity are preserved.  There is no focal abnormality.  Renal length 10.4 cm.  Left Kidney:  No hydronephrosis.  Well-preserved cortex.  Normal size and parenchymal echotexture without focal abnormalities. Renal length 11.2 cm.  Bladder:  Unremarkable.  Bilateral ureteral jets are noted.  IMPRESSION: Normal renal ultrasound.  No hydronephrosis.   Original Report Authenticated By: Carey Bullocks, M.D.    Dg Chest Port 1 View  09/04/2012  *RADIOLOGY REPORT*  Clinical Data: Evaluate central line placement  PORTABLE CHEST - 1 VIEW  Comparison: Prior chest x-ray 08/09/2012  Findings: Interval placement of a left IJ approach central venous catheter.  The catheter tip projects over the upper superior vena cava.  No evidence of pneumothorax.  Surgical changes of prior right mastectomy and axillary nodal dissection.  Cardiac and mediastinal contours are within normal limits.  The lungs are clear.  Degenerative changes again noted throughout the thoracic spine.  Query bilateral cervical ribs.  IMPRESSION: The tip of the left IJ  catheter projects over the upper superior vena cava.  No evidence of complicating pneumothorax.   Original Report Authenticated By: Malachy Moan, M.D.     Assessment & Plan by Problem: 1.Acute Renal failure likely multifactorial: Cr downtrending today. Pt appears baseline Cr 0.8-0.9 and steady increase during admission today 2.26 from 2.07 on 09/04/12. FeNa 2.7 suggesting ATN. Pt was on diuretics and FeUrea unable to be calculated as results pending still. Obstructive r/o with normal bladder scan. UA showed no casts or eosinophils. Pre-renal is less likely given high FeNa but pt receiving fluid in light of decreased po intake and tolerating well.  -cont fluid -cont to d/c offending agents: NSAIDs, lisinopril, glyberide, and  -daily I&O along with daily weights -daily bmp  2. HTN: stable. Continue to monitor -d/c lisinopril and HCTZ -prn hydralazine  3. Acute anemia: pt has baseline anemia ~12. During admission Hgb 7. Pt received 2 U on 5/3 for Hgb 6.6. No active sites of bleeding.  -trend cbc -transfuse Hgb <7 -FOBT pending -anemia panel  Signed: Christen Bame, MD PGY-1 Pgr: [phone] 09/06/2012, 8:38 AM

## 2012-09-06 NOTE — Clinical Social Work Psychosocial (Signed)
Clinical Social Work Department  BRIEF PSYCHOSOCIAL ASSESSMENT  Patient: [name]  Account Number: [id number]  Admit date: 08/31/12 Clinical Social Worker Sabino Niemann, MSW Date/Time: 09/06/2012 Referred by: Physician Date Referred:  Referred for   SNF Placement   Other Referral:  Interview type: Patient  Other interview type: PSYCHOSOCIAL DATA  Living Status:Husband Admitted from facility:  Level of care:  Primary support name: Stephen,Derrick  Primary support relationship to patient: Husband Degree of support available:  Strong and vested  CURRENT CONCERNS  Current Concerns   Post-Acute Placement   Other Concerns:  SOCIAL WORK ASSESSMENT / PLAN  CSW met with pt re: PT recommendation for SNF.   Pt lives with her husband but he works during the day  CSW explained placement process and answered questions.   Pt reports no preference at this time    CSW completed FL2 and initiated SNF search.     Assessment/plan status: Information/Referral to [facility]  Other assessment/ plan:  Information/referral to community resources:  SNF   PTAR  PATIENT'S/FAMILY'S RESPONSE TO PLAN OF CARE:  Pt  reports she is agreeable to ST SNF in order to increase strength and independence with mobility prior to returning home  Pt verbalized understanding of placement process and appreciation for CSW assist.   Sabino Niemann, MSW [phone]

## 2012-09-06 NOTE — Consult Note (Signed)
Internal Medicine Teaching Service Attending Note Date: 09/06/2012  Patient name: [name]  Medical record number: [id number]  Date of birth: May 08, 1945    This patient has been seen and discussed with the house staff. Please see their note for complete details. I concur with their findings with the following additions/corrections:  Patient is a 67 year old woman with past medical history of uncontrolled diabetes, right knee replacement 2 weeks ago who was admitted about a week ago for knee surgerycomplicated by infection. Patient was found to be positive for MRSA and currently on treatment as per infectious disease. Patient has had a complicated length of stay with her rising creatinine and anemia complicating her stay which warranted internal medicine consult.   Internal medicine clinic is Primary outpatient care provider for this patient.  Past medical history, past surgical history, medications, social history and family history noted as per resident's note.  BP 116/58  Pulse 79  Temp(Src) 98.1 F (36.7 C) (Oral)  Resp 18  Ht 4\' 11"  (1.499 m)  Wt 158 lb 1.6 oz (71.714 kg)  BMI 31.92 kg/m2  SpO2 97% Physical Exam: General: Vital signs reviewed and noted. Well-developed, well-nourished, in no acute distress; alert, appropriate and cooperative throughout examination.  Head: Normocephalic, atraumatic.  Eyes: PERRL, EOMI, No signs of anemia or jaundince.  Nose: Mucous membranes moist, not inflammed, nonerythematous.  Throat: Oropharynx nonerythematous, no exudate appreciated.   Neck: No deformities, masses, or tenderness noted.Supple, No carotid Bruits, no JVD.  Lungs:  Normal respiratory effort. Clear to auscultation BL without crackles or wheezes.  Heart: RRR. S1 and S2 normal without gallop, murmur, or rubs.  Abdomen:  BS normoactive. Soft, Nondistended, non-tender.  No masses or organomegaly.  Extremities: No pretibial edema.right knee wrapped in ACE bandage with wound vac  draining bloody serosanguinous fluid   Neurologic: A&O X3, CN II - XII are grossly intact. Motor strength is 5/5 in the all 4 extremities, Sensations intact to light touch, Cerebellar signs negative.  Skin: No visible rashes, scars.   Labs and imaging studies reviewed.  Assessment and plan: Patient is a 67 year old female with past medical history most significant for diabetes, hypertension, hyperlipidemia, anemia, fatty liver disease, history of breast cancer, osteoarthritis status post right knee replacement complicated by infection of the prosthetic knee joint is currently in the hospital for treatment of bacteremia and infected knee joint. Her hospital stay has been complicated by acute renal failure and anemia. -with fractional excretion of sodium more than 2 prerenal in etiology is less likely -Post renal obstruction has been ruled out by a normal ultrasound which does not show hydronephrosis -Patient's acute renal failure most likely is due to acute interstitial nephritis from antibiotics or ATN. -Continue gentle hydration -check daily I and o's -daily weights -continue to monitor daily crt  Anemia: basline around 12. Anemia panel and tranfuse if <7. Also check FOBT to look for source.  Continue to monitor.   Lars Mage 09/06/2012, 7:43 AM

## 2012-09-06 NOTE — Progress Notes (Signed)
ANTIBIOTIC CONSULT NOTE - FOLLOW UP  Pharmacy Consult for Vancomycin Indication: Infected total joint  Allergies  Allergen Reactions  . Metformin Other (See Comments)    REACTION, diarrhea: 850 mg - diarrhea but tolerates 500 mg daily    Patient Measurements: Height: 4\' 11"  (149.9 cm) Weight: 158 lb 1.6 oz (71.714 kg) IBW/kg (Calculated) : 43.2  Vital Signs: Temp: 98.1 F (36.7 C) (05/05 0530) Temp src: Oral (05/05 0530) BP: 116/58 mmHg (05/05 0530) Pulse Rate: 75 (05/05 0530) Intake/Output from previous day: 05/04 0701 - 05/05 0700 In: 2672 [P.O.:600; I.V.:1035; IV Piggyback:1000] Out: 2855 [Urine:2850; Drains:5] Intake/Output from this shift:    Labs:  Recent Labs  09/04/12 0510 09/05/12 1000 09/05/12 1255 09/06/12 0500  WBC 6.3 6.1  --  5.3  HGB 6.6* 8.6*  --  7.5*  PLT 334 281  --  254  LABCREA  --   --  21.29  --   CREATININE 2.07* 2.26*  --  2.17*   Estimated Creatinine Clearance: 22.3 ml/min (by C-G formula based on Cr of 2.17).  Recent Labs  09/04/12 2100 09/06/12 0500  VANCORANDOM 47.0 27.8    Assessment: 67yof s/p R TKR on 08/16/12 continues on vancomycin day #7 and rifampin day #3 for infected total joint.  She is s/p I&D 5/2 w/ removal and replacement of polyethylene bearing and placement of gentamicin and vancomycin beads.   She developed ARF which resulted in supratherapeutic vancomycin levels. sCr remains elevated but is beginning to trend down but half-life of vancomycin is ~45 hours and level is still supratherapeutic. Anticipate renal function to continue improving so will check another level tomorrow morning.  4/29 Vancomycin>>  5/3 0600 Last dose  5/3 2100 Random level>>47  5/5 0500 Random level>>27.8 5/3 Rifampin>> 4/30: Knee fluid cx>> negative 5/2: Wound cx>>MRSA  Goal of Therapy:  Vancomycin trough level 15-20 mcg/ml  Plan:  Continue to hold Vancomycin doses Reassess level 5/6 AM  Fredrik Rigger 09/06/2012,8:35  AM

## 2012-09-06 NOTE — Progress Notes (Signed)
Discussed case with Wyatt Portela, PTA and changes to DC plan.  Pt feels she may not be able to manage IV antibiotics at home and will not have 24 hour care at home.  May benefit from short term rehab for PT with the focus on safe mobility.   [address] [place], [place]  [phone] 09/06/2012

## 2012-09-06 NOTE — Progress Notes (Signed)
OT Cancellation Note  Patient Details Name: COVA KNIERIEM MRN: [id number] DOB: 1946-04-21   Cancelled Treatment:    Reason Eval/Treat Not Completed: Other (comment) (screen) Order received, reviewed chart. Pt for transfer to SNF for rehab. No acute OT needs identified. Will sign off.     Earlie Raveling OTR/L [phone] 09/06/2012, 1:55 PM

## 2012-09-06 NOTE — Progress Notes (Signed)
PATIENT ID: [name]  MRN: [id number]  DOB/AGE:  Dec 12, 1945 / 67 y.o.  3 Days Post-Op Procedure(s) (LRB): IRRIGATION AND DEBRIDEMENT RIGHT KNEE WITH POLY EXCHANGE (Right)    PROGRESS NOTE Subjective: Patient is alert, oriented, no Nausea, no Vomiting, yes passing gas, no Bowel Movement. Taking PO well. Denies SOB, Chest or Calf Pain. Using Incentive Spirometer, PAS in place. Patient reports pain as 5 on 0-10 scale  .    Objective: Vital signs in last 24 hours: Filed Vitals:   09/05/12 1428 09/05/12 1627 09/05/12 2100 09/06/12 0530  BP: 142/69  142/64 116/58  Pulse: 82  99 75  Temp: 97.6 F (36.4 C)  99 F (37.2 C) 98.1 F (36.7 C)  TempSrc: Oral  Oral Oral  Resp: 20 16 20 20   Height:      Weight:      SpO2: 100% 98% 100% 99%      Intake/Output from previous day: I/O last 3 completed shifts: In: 4532 [P.O.:1200; I.V.:1935; Blood:350; Other:47; IV Piggyback:1000] Out: 2855 [Urine:2850; Drains:5]   Intake/Output this shift:     LABORATORY DATA:  Recent Labs  09/05/12 1000  09/05/12 1648 09/05/12 2213 09/06/12 0500 09/06/12 0712  WBC 6.1  --   --   --  5.3  --   HGB 8.6*  --   --   --  7.5*  --   HCT 24.8*  --   --   --  21.1*  --   PLT 281  --   --   --  254  --   NA 137  --   --   --  141  --   K 3.9  --   --   --  4.0  --   CL 102  --   --   --  108  --   CO2 27  --   --   --  24  --   BUN 21  --   --   --  16  --   CREATININE 2.26*  --   --   --  2.17*  --   GLUCOSE 136*  --   --   --  110*  --   GLUCAP  --   < > 95 112*  --  111*  CALCIUM 9.0  --   --   --  8.7  --   < > = values in this interval not displayed.  Examination: Neurologically intact ABD soft Neurovascular intact Sensation intact distally Intact pulses distally Dorsiflexion/Plantar flexion intact Incision: scant drainage}  Large drains pulled today.  Assessment:   3 Days Post-Op Procedure(s) (LRB): IRRIGATION AND DEBRIDEMENT RIGHT KNEE WITH POLY EXCHANGE (Right) ADDITIONAL  DIAGNOSIS:  Renal Insufficiency Acute  Plan: PT/OT WBAT, CPM 5/hrs day until ROM 0-90 degrees, then D/C CPM DVT Prophylaxis:  SCDx72hrs, ASA 325 mg BID x 2 weeks DISCHARGE PLAN: Skilled Nursing Facility/Rehab DISCHARGE NEEDS: HHPT, HHRN, Walker, 3-in-1 comode seat and IV Antibiotics     Lanah Steines M. 09/06/2012, 7:28 AM

## 2012-09-06 NOTE — Progress Notes (Addendum)
Physical Therapy Treatment Patient Details Name: [name] MRN: [id number] DOB: Feb 08, 1946 Today's Date: 09/06/2012 Time: 4098-1191 PT Time Calculation (min): 32 min  PT Assessment / Plan / Recommendation Comments on Treatment Session  Patient able to progress with ambulation this morning. Patietn sharing some concerns reguarding discharging home. Patient states husband is not home all the time and she is concerned about being on anitbiotics with IV when she goes home. I spoke with the CSW Amy about the patients concern and she is planning to speak with patient as well.     Follow Up Recommendations  Home health PT;SNF (depending on IV antibiotics and assistance at home)     Does the patient have the potential to tolerate intense rehabilitation     Barriers to Discharge        Equipment Recommendations  None recommended by PT    Recommendations for Other Services    Frequency Min 3X/week   Plan Discharge plan needs to be updated;Frequency remains appropriate    Precautions / Restrictions Precautions Knee Immobilizer - Right: On at all times Restrictions RLE Weight Bearing: Weight bearing as tolerated Other Position/Activity Restrictions: no ROM exercies with RLE, no CPM   Pertinent Vitals/Pain     Mobility  Bed Mobility Supine to Sit: 4: Min assist;HOB elevated;With rails Sitting - Scoot to Edge of Bed: 7: Independent Details for Bed Mobility Assistance: A for trunk support up into sitting Transfers Sit to Stand: 5: Supervision;From chair/3-in-1;From bed Stand to Sit: 5: Supervision;To chair/3-in-1;Without upper extremity assist Ambulation/Gait Ambulation/Gait Assistance: 5: Supervision Ambulation Distance (Feet): 100 Feet Assistive device: Rolling walker Ambulation/Gait Assistance Details: Cues for posture Gait velocity: increasing    Exercises     PT Diagnosis:    PT Problem List:   PT Treatment Interventions:     PT Goals Acute Rehab PT Goals PT Goal:  Supine/Side to Sit - Progress: Progressing toward goal PT Goal: Sit to Stand - Progress: Progressing toward goal PT Goal: Ambulate - Progress: Progressing toward goal  Visit Information  Last PT Received On: 09/06/12 Assistance Needed: +1    Subjective Data      Cognition  Cognition Arousal/Alertness: Awake/alert Behavior During Therapy: WFL for tasks assessed/performed Overall Cognitive Status: Within Functional Limits for tasks assessed    Balance     End of Session PT - End of Session Equipment Utilized During Treatment: Gait belt Activity Tolerance: Patient tolerated treatment well;Other (comment) Patient left: with call bell/phone within reach;in chair   GP     Kyera Felan, Adline Potter 09/06/2012, 10:22 AM 09/06/2012 Fredrich Birks PTA [phone] pager [phone] office

## 2012-09-07 ENCOUNTER — Inpatient Hospital Stay (HOSPITAL_COMMUNITY): Payer: Medicare Other

## 2012-09-07 LAB — OCCULT BLOOD X 1 CARD TO LAB, STOOL: Fecal Occult Bld: NEGATIVE

## 2012-09-07 LAB — BASIC METABOLIC PANEL
BUN: 13 mg/dL (ref 6–23)
CO2: 24 mEq/L (ref 19–32)
Calcium: 9.3 mg/dL (ref 8.4–10.5)
Chloride: 106 mEq/L (ref 96–112)
Creatinine, Ser: 2.13 mg/dL — ABNORMAL HIGH (ref 0.50–1.10)
GFR calc Af Amer: 27 mL/min — ABNORMAL LOW (ref >90)
GFR calc non Af Amer: 23 mL/min — ABNORMAL LOW (ref >90)
Glucose, Bld: 127 mg/dL — ABNORMAL HIGH (ref 70–99)
Potassium: 4.1 mEq/L (ref 3.5–5.1)
Sodium: 139 mEq/L (ref 135–145)

## 2012-09-07 LAB — GLUCOSE, CAPILLARY
Glucose-Capillary: 98 mg/dL (ref 70–99)
Glucose-Capillary: 129 mg/dL — ABNORMAL HIGH (ref 70–99)
Glucose-Capillary: 75 mg/dL (ref 70–99)
Glucose-Capillary: 89 mg/dL (ref 70–99)

## 2012-09-07 LAB — VANCOMYCIN, RANDOM: Vancomycin Rm: 20.3 ug/mL

## 2012-09-07 MED ORDER — SODIUM CHLORIDE 0.9 % IV BOLUS (SEPSIS): 1000.0000 mL | Freq: Once | INTRAVENOUS | Status: DC

## 2012-09-07 MED ORDER — SODIUM CHLORIDE 0.9 % IV BOLUS (SEPSIS)
1000.0000 mL | Freq: Once | INTRAVENOUS | Status: AC
  Administered 2012-09-07: 1000 mL via INTRAVENOUS

## 2012-09-07 MED ORDER — VANCOMYCIN HCL IN DEXTROSE 1-5 GM/200ML-% IV SOLN
1000.0000 mg | INTRAVENOUS | Status: DC
  Administered 2012-09-07: 1000 mg via INTRAVENOUS
  Filled 2012-09-07: qty 200

## 2012-09-07 NOTE — Procedures (Signed)
LUE PICC 50 CM SVC RA No comp

## 2012-09-07 NOTE — Progress Notes (Signed)
Patient ID: [name], female   DOB: 1946/02/01, 67 y.o.   MRN: [id number] PATIENT ID: [name]  MRN: [id number]  DOB/AGE:  [birth year] / 67 y.o.  4 Days Post-Op Procedure(s) (LRB): IRRIGATION AND DEBRIDEMENT RIGHT KNEE WITH POLY EXCHANGE (Right)    PROGRESS NOTE Subjective: Patient is alert, oriented, no Nausea, no Vomiting, yes passing gas, yes Bowel Movement. Taking PO well. Denies SOB, Chest or Calf Pain. Using Incentive Spirometer, PAS in place. Ambulate weight bearing as tolerated, no CPM, will not push motion since patient is getting over an infection. Patient reports pain as 2 on 0-10 scale  .    Objective: Vital signs in last 24 hours: Filed Vitals:   09/06/12 2000 09/06/12 2141 09/07/12 0000 09/07/12 0400  BP:  156/60    Pulse:  82    Temp:  98 F (36.7 C)    TempSrc:  Oral    Resp: 19 18 18 18   Height:      Weight:      SpO2: 94% 94% 94% 94%      Intake/Output from previous day: I/O last 3 completed shifts: In: 2265 [P.O.:240; I.V.:1000; Other:25; IV Piggyback:1000] Out: 3602 [Urine:3600; Stool:2]   Intake/Output this shift:     LABORATORY DATA:  Recent Labs  09/05/12 1000  09/06/12 0500  09/06/12 1604 09/06/12 2107 09/07/12 0704  WBC 6.1  --  5.3  --   --   --   --   HGB 8.6*  --  7.5*  --   --   --   --   HCT 24.8*  --  21.1*  --   --   --   --   PLT 281  --  254  --   --   --   --   NA 137  --  141  --   --   --   --   K 3.9  --  4.0  --   --   --   --   CL 102  --  108  --   --   --   --   CO2 27  --  24  --   --   --   --   BUN 21  --  16  --   --   --   --   CREATININE 2.26*  --  2.17*  --   --   --   --   GLUCOSE 136*  --  110*  --   --   --   --   GLUCAP  --   < >  --   < > 128* 100* 98  CALCIUM 9.0  --  8.7  --   --   --   --   < > = values in this interval not displayed.  Examination: Neurologically intact ABD soft Neurovascular intact Sensation intact distally Intact pulses distally Dorsiflexion/Plantar flexion  intact Incision: scant drainage No cellulitis present Compartment soft in drain tube}Blood and plasma separated in drain indicating minimal recent drainage, drain pulled without difficulty. Dressing changed today and skin is intact no necrosis swelling is dramatically diminished and patient is much less tender to palpation over the proximal medial flare of the tibia or her infection was most active.   Assessment:   4 Days Post-Op Procedure(s) (LRB): IRRIGATION AND DEBRIDEMENT RIGHT KNEE WITH POLY EXCHANGE (Right) ADDITIONAL DIAGNOSIS:  Diabetes, Hypertension and Renal Insufficiency Acute  Plan: PT/OT WBAT, no CPM,  gentle active range of motion only avoid pushing motion since patient is getting overactive infection DVT Prophylaxis:  SCDx72hrs, ASA 325 mg BID x 2 weeks DISCHARGE PLAN: Home, patient will be on IV antibiotics for 6 weeks and then by mouth antibiotics for 6 months per infectious disease consultants. Plan to discharge home tomorrow if knee looks good it would drains out 24 hours. DISCHARGE NEEDS: HHPT, HHRN, CPM, Walker, 3-in-1 comode seat and IV Antibiotics     Valentino Saavedra J 09/07/2012, 7:16 AM

## 2012-09-07 NOTE — Progress Notes (Addendum)
Internal Medicine Teaching Service Attending Note Date: 09/07/2012  Patient name: [name]  Medical record number: [id number]  Date of birth: October 10, 1945    This patient has been seen and discussed with the house staff. Please see their note for complete details. I concur with their findings with the following additions/corrections: The patient's creatinine is about the same. Ins and outs suggest that patient is in diuretic phase of her HTN. We will continue to monitor her electrolytes and her fluid status and make sure that she does not get hypovolemic. Continue strict I.'s and O.'s. Patient probably nearing discharge after input from infectious disease regarding outpatient antibiotic regimen.  Lars Mage 09/07/2012, 8:17 PM

## 2012-09-07 NOTE — Progress Notes (Signed)
INFECTIOUS DISEASE PROGRESS NOTE  ID: [name] is a 67 y.o. female with   Principal Problem:   Infection of prosthetic knee joint Active Problems:   DIABETES MELLITUS, TYPE II  Subjective:  without complaints  Abtx:  Anti-infectives   Start     Dose/Rate Route Frequency Ordered Stop   09/07/12 2000  vancomycin (VANCOCIN) IVPB 1000 mg/200 mL premix     1,000 mg 200 mL/hr over 60 Minutes Intravenous Every 48 hours 09/07/12 1007     09/05/12 1200  ceFAZolin (ANCEF) IVPB 2 g/50 mL premix  Status:  Discontinued     2 g 100 mL/hr over 30 Minutes Intravenous Every 12 hours 09/05/12 1034 09/05/12 1303   09/03/12 1800  rifampin (RIFADIN) capsule 300 mg     300 mg Oral Daily 09/03/12 1551     09/03/12 1730  cefTRIAXone (ROCEPHIN) 1 g in dextrose 5 % 50 mL IVPB  Status:  Discontinued     1 g 100 mL/hr over 30 Minutes Intravenous Every 24 hours 09/03/12 1551 09/05/12 1034   09/03/12 1600  vancomycin (VANCOCIN) IVPB 1000 mg/200 mL premix  Status:  Discontinued     1,000 mg 200 mL/hr over 60 Minutes Intravenous Every 12 hours 09/03/12 1556 09/03/12 1559   09/03/12 1157  gentamicin (GARAMYCIN) injection  Status:  Discontinued       As needed 09/03/12 1200 09/03/12 1302   09/03/12 1150  vancomycin (VANCOCIN) powder  Status:  Discontinued       As needed 09/03/12 1157 09/03/12 1302   09/01/12 0600  ceFAZolin (ANCEF) IVPB 2 g/50 mL premix     2 g 100 mL/hr over 30 Minutes Intravenous On call to O.R. 08/31/12 2007 09/02/12 0559   08/31/12 1400  vancomycin (VANCOCIN) IVPB 1000 mg/200 mL premix  Status:  Discontinued     1,000 mg 200 mL/hr over 60 Minutes Intravenous Every 8 hours 08/31/12 1204 09/04/12 1022      Medications:  Scheduled: . anastrozole  1 mg Oral QHS  . aspirin EC  325 mg Oral Q breakfast  . docusate sodium  100 mg Oral BID  . insulin aspart  0-15 Units Subcutaneous TID WC  . metoprolol succinate  25 mg Oral Daily  . rifampin  300 mg Oral Daily  . senna  1  tablet Oral BID WC  . simvastatin  20 mg Oral q1800  . sodium chloride  1,000 mL Intravenous Once  . sodium chloride  1,000 mL Intravenous Once  . sodium chloride  1,000 mL Intravenous Once  . sodium chloride  10-40 mL Intracatheter Q12H  . vancomycin  1,000 mg Intravenous Q48H    Objective: Vital signs in last 24 hours: Temp:  [98 F (36.7 C)-98.5 F (36.9 C)] 98 F (36.7 C) (05/05 2141) Pulse Rate:  [73-82] 82 (05/05 2141) Resp:  [16-19] 18 (05/06 0400) BP: (149-156)/(60-65) 156/60 mmHg (05/05 2141) SpO2:  [94 %-99 %] 94 % (05/06 0400) Weight:  [72.122 kg (159 lb)-73.074 kg (161 lb 1.6 oz)] 72.122 kg (159 lb) (05/06 1049)   General appearance: alert, cooperative and no distress Neck: L IJ Resp: clear to auscultation bilaterally Cardio: regular rate and rhythm GI: normal findings: bowel sounds normal and soft, non-tender Extremities: LUE IJ.  R knee wrapped.   Lab Results  Recent Labs  09/05/12 1000 09/06/12 0500 09/07/12 0515  WBC 6.1 5.3  --   HGB 8.6* 7.5*  --   HCT 24.8* 21.1*  --  NA 137 141 139  K 3.9 4.0 4.1  CL 102 108 106  CO2 27 24 24   BUN 21 16 13   CREATININE 2.26* 2.17* 2.13*   Liver Panel No results found for this basename: PROT, ALBUMIN, AST, ALT, ALKPHOS, BILITOT, BILIDIR, IBILI,  in the last 72 hours Sedimentation Rate No results found for this basename: ESRSEDRATE,  in the last 72 hours C-Reactive Protein No results found for this basename: CRP,  in the last 72 hours  Microbiology: Recent Results (from the past 240 hour(s))  GRAM STAIN     Status: None   Collection Time    09/01/12  7:30 AM      Result Value Range Status   Specimen Description FLUID SYNOVIAL RIGHT KNEE   Final   Special Requests PATIENT ON FOLLOWING VANCO   Final   Gram Stain     Final   Value: RARE WBC PRESENT, PREDOMINANTLY PMN     NO ORGANISMS SEEN     VERBAL REPORT GIVEN TO DR. Turner Daniels 09/01/12 0715   Report Status 09/01/2012 FINAL   Final  BODY FLUID CULTURE      Status: None   Collection Time    09/01/12  7:30 AM      Result Value Range Status   Specimen Description FLUID SYNOVIAL RIGHT KNEE   Final   Special Requests PATIENT ON FOLLOWING VANCO   Final   Gram Stain     Final   Value: ABUNDANT WBC PRESENT, PREDOMINANTLY PMN     NO ORGANISMS SEEN   Culture NO GROWTH 3 DAYS   Final   Report Status 09/04/2012 FINAL   Final  WOUND CULTURE     Status: None   Collection Time    09/03/12 12:08 PM      Result Value Range Status   Specimen Description WOUND   Final   Special Requests RIGHT TOTAL KNEE PT ON VANCO   Final   Gram Stain     Final   Value: NO WBC SEEN     NO SQUAMOUS EPITHELIAL CELLS SEEN     NO ORGANISMS SEEN   Culture     Final   Value: FEW METHICILLIN RESISTANT STAPHYLOCOCCUS AUREUS     Note: RIFAMPIN AND GENTAMICIN SHOULD NOT BE USED AS SINGLE DRUGS FOR TREATMENT OF STAPH INFECTIONS. CRITICAL RESULT CALLED TO, READ BACK BY AND VERIFIED WITH: TONYA B@1055  ON [id number] BY [facility] This organism DOES NOT demonstrate inducible Clindamycin      resistance in vitro.   Report Status 09/06/2012 FINAL   Final   Organism ID, Bacteria METHICILLIN RESISTANT STAPHYLOCOCCUS AUREUS   Final  ANAEROBIC CULTURE     Status: None   Collection Time    09/03/12 12:08 PM      Result Value Range Status   Specimen Description WOUND   Final   Special Requests RIGHT TOTAL KNEE PT ON VANCO   Final   Gram Stain PENDING   Incomplete   Culture     Final   Value: NO ANAEROBES ISOLATED; CULTURE IN PROGRESS FOR 5 DAYS   Report Status PENDING   Incomplete    Studies/Results: US Renal  09/05/2012  *RADIOLOGY REPORT*  Clinical Data: Acute renal failure.  History of diabetes, hypertension and hysterectomy.  RENAL/URINARY TRACT ULTRASOUND COMPLETE  Comparison:  Abdominal CT 04/20/2007.  PET CT 10/09/2010.  Findings:  Right Kidney:  Mild prominence of the right renal pelvis is stable. There is no hydronephrosis.  Renal cortical thickness and  echogenicity are preserved.   There is no focal abnormality.  Renal length 10.4 cm.  Left Kidney:  No hydronephrosis.  Well-preserved cortex.  Normal size and parenchymal echotexture without focal abnormalities. Renal length 11.2 cm.  Bladder:  Unremarkable.  Bilateral ureteral jets are noted.  IMPRESSION: Normal renal ultrasound.  No hydronephrosis.   Original Report Authenticated By: Carey Bullocks, M.D.    Ir Fluoro Guide Cv Line Left  09/07/2012  *RADIOLOGY REPORT*  Clinical Data: Right knee infection  PICC LINE PLACEMENT WITH ULTRASOUND AND FLUOROSCOPIC  GUIDANCE  Fluoroscopy Time: 42 seconds.  The left arm was prepped with chlorhexidine, draped in the usual sterile fashion using maximum barrier technique (cap and mask, sterile gown, sterile gloves, large sterile sheet, hand hygiene and cutaneous antisepsis) and infiltrated locally with 1% Lidocaine.  Ultrasound demonstrated patency of the left basilic vein, and this was documented with an image.  Under real-time ultrasound guidance, this vein was accessed with a 21 gauge micropuncture needle and image documentation was performed.  The needle was exchanged over a guidewire for a peel-away sheath through which a five [place] single lumen PICC trimmed to 50 cm was advanced, positioned with its tip at the lower SVC/right atrial junction.  Fluoroscopy during the procedure and fluoro spot radiograph confirms appropriate catheter position.  The catheter was flushed, secured to the skin with Prolene sutures, and covered with a sterile dressing.  Complications:  None.  IMPRESSION: Successful left arm PICC line placement with ultrasound and fluoroscopic guidance.  The catheter is ready for use.   Original Report Authenticated By: Jolaine Click, M.D.    Ir US Guide Vasc Access Left  09/07/2012  *RADIOLOGY REPORT*  Clinical Data: Right knee infection  PICC LINE PLACEMENT WITH ULTRASOUND AND FLUOROSCOPIC  GUIDANCE  Fluoroscopy Time: 42 seconds.  The left arm was prepped with chlorhexidine, draped in  the usual sterile fashion using maximum barrier technique (cap and mask, sterile gown, sterile gloves, large sterile sheet, hand hygiene and cutaneous antisepsis) and infiltrated locally with 1% Lidocaine.  Ultrasound demonstrated patency of the left basilic vein, and this was documented with an image.  Under real-time ultrasound guidance, this vein was accessed with a 21 gauge micropuncture needle and image documentation was performed.  The needle was exchanged over a guidewire for a peel-away sheath through which a five [place] single lumen PICC trimmed to 50 cm was advanced, positioned with its tip at the lower SVC/right atrial junction.  Fluoroscopy during the procedure and fluoro spot radiograph confirms appropriate catheter position.  The catheter was flushed, secured to the skin with Prolene sutures, and covered with a sterile dressing.  Complications:  None.  IMPRESSION: Successful left arm PICC line placement with ultrasound and fluoroscopic guidance.  The catheter is ready for use.   Original Report Authenticated By: Jolaine Click, M.D.      Assessment/Plan: Septic Arthritis/MRSA  ARF  DM  Total days of antibiotics: vanco/rifampin day 8   Cr slightly better, now off NSAIDS, ACE-I.  Not sure where this will settle.... Her vanco has been restarted.  My great appreciation to the outstanding eval by Dr Burtis Junes and to pharmacy for their assistance with vanco dosing.   [name] Infectious Diseases [phone] 09/07/2012, 11:42 AM   LOS: 7 days

## 2012-09-07 NOTE — Progress Notes (Signed)
Hospital Consult Note Date: 09/07/2012  Patient name: [name] Medical record number: [id number] Date of birth: 06-29-1945 Age: 67 y.o. Gender: female PCP: Stacy Gardner, MD  Medical Service: orthopedic surgery Attending name: Dr. Turner Daniels  Reason for consult: continued Cr rise in setting of MRSA bacteremia   Subjective: pt feeling better this AM getting ready for d/c to rehab. No active sties of bleeding noted by patient.   Physical Exam: Blood pressure 156/60, pulse 82, temperature 98 F (36.7 C), temperature source Oral, resp. rate 18, height 4\' 11"  (1.499 m), weight 158 lb 1.6 oz (71.714 kg), SpO2 94.00%. General: resting in bed, some acute distress  HEENT: PERRL, EOMI, no scleral icterus, dry MM  Cardiac: RRR, no rubs, murmurs or gallops  Pulm: clear to auscultation bilaterally, moving normal volumes of air  Abd: soft, nontender, nondistended, BS present  Ext: warm and well perfused, no pedal edema, right total knee immobilizer in place with wound vac draining bloody serosanguinous fluid  Neuro: alert and oriented X3, cranial nerves II-XII grossly intact  Lab results: Basic Metabolic Panel:  Recent Labs  40/98/11 1000 09/06/12 0500  NA 137 141  K 3.9 4.0  CL 102 108  CO2 27 24  GLUCOSE 136* 110*  BUN 21 16  CREATININE 2.26* 2.17*  CALCIUM 9.0 8.7   CBC:  Recent Labs  09/05/12 1000 09/06/12 0500  WBC 6.1 5.3  HGB 8.6* 7.5*  HCT 24.8* 21.1*  MCV 84.1 84.1  PLT 281 254   CBG:  Recent Labs  09/05/12 2213 09/06/12 0712 09/06/12 1108 09/06/12 1604 09/06/12 2107 09/07/12 0704  GLUCAP 112* 111* 139* 128* 100* 98   Imaging results:  US Renal  09/05/2012  *RADIOLOGY REPORT*  Clinical Data: Acute renal failure.  History of diabetes, hypertension and hysterectomy.  RENAL/URINARY TRACT ULTRASOUND COMPLETE  Comparison:  Abdominal CT 04/20/2007.  PET CT 10/09/2010.  Findings:  Right Kidney:  Mild prominence of the right renal pelvis is stable. There is no  hydronephrosis.  Renal cortical thickness and echogenicity are preserved.  There is no focal abnormality.  Renal length 10.4 cm.  Left Kidney:  No hydronephrosis.  Well-preserved cortex.  Normal size and parenchymal echotexture without focal abnormalities. Renal length 11.2 cm.  Bladder:  Unremarkable.  Bilateral ureteral jets are noted.  IMPRESSION: Normal renal ultrasound.  No hydronephrosis.   Original Report Authenticated By: Carey Bullocks, M.D.     Assessment & Plan by Problem: 1.Acute Renal failure likely multifactorial: Cr continues to downtrend 2.13<<2.17<<2.26 (baseline Cr 0.8-0.9) FeNa 2.7 and FeUrea 65% suggesting ATN. Obstructive r/o with normal bladder scan. UA showed no casts or eosinophils making AIN less likely. Pre-renal is less likely given high FeNa but pt receiving fluid in light of decreased po intake and tolerating well. Pt continues to have good UO (poor/innacurate recording of I&O)  -3L IVF NS bolus -cont to d/c offending agents: (NSAIDs, lisinopril, glyberide) upon d/c  -daily I&O along with daily weights -daily bmp  2. HTN: stable. Continue to monitor -d/c lisinopril and HCTZ -prn hydralazine  3. Acute anemia + anemia chronic disease: pt has baseline anemia ~12. During admission Hgb 7. Pt received 2 U on 5/3 for Hgb 6.6. No active sites of bleeding. Anemia panel shows mixed picture of both anemia of chronic disease and Fe deficiency anemia. VB12 and folate both normal.  -trend cbc -transfuse Hgb <7 -FOBT pending -f/u as outpt with PCP  Signed: Christen Bame, MD PGY-1 Pgr: [phone] 09/07/2012, 7:29 AM

## 2012-09-07 NOTE — Progress Notes (Signed)
ANTIBIOTIC CONSULT NOTE - FOLLOW UP  Pharmacy Consult for Vancomycin Indication: Infected total joint  Allergies  Allergen Reactions  . Metformin Other (See Comments)    REACTION, diarrhea: 850 mg - diarrhea but tolerates 500 mg daily    Patient Measurements: Height: 4\' 11"  (149.9 cm) Weight: 158 lb 1.6 oz (71.714 kg) IBW/kg (Calculated) : 43.2  Vital Signs:   Intake/Output from previous day: 05/05 0701 - 05/06 0700 In: 120 [P.O.:120] Out: 2302 [Urine:2300; Stool:2] Intake/Output from this shift:    Labs:  Recent Labs  09/05/12 1000 09/05/12 1255 09/06/12 0500 09/07/12 0515  WBC 6.1  --  5.3  --   HGB 8.6*  --  7.5*  --   PLT 281  --  254  --   LABCREA  --  21.29  --   --   CREATININE 2.26*  --  2.17* 2.13*   Estimated Creatinine Clearance: 22.7 ml/min (by C-G formula based on Cr of 2.13).  Recent Labs  09/06/12 0500 09/07/12 0515  VANCORANDOM 27.8 20.3    Assessment: 67yof s/p R TKR on 08/16/12 continues on vancomycin day #8 and rifampin day #4 for infected total joint.  She is s/p I&D 5/2 w/ removal and replacement of polyethylene bearing and placement of gentamicin and vancomycin beads.   She developed ARF which resulted in supratherapeutic vancomycin levels. sCr remains elevated but trending down and she has good UOP. Vancomycin level is now within goal. Based on kinetics she will need to be re-dosed tonight.  5/4 Ancef>>5/4 4/29 Vanco Rx>> 5/3 0600 Last dose 5/3 2100 Random level>>47 5/5 0500 Random level>>27.8 5/6 0515 Random level>>20.3 5/3 Rifampin>>  4/30: Knee fluid gram stain>> negative 5/2: R knee wound: MRSA  Goal of Therapy:  Vancomycin trough level 15-20 mcg/ml  Plan:  1) Resume vancomycin 1g IV q48 tonight at 2000 2) Check another trough at steady state  Fredrik Rigger 09/07/2012,10:04 AM

## 2012-09-08 DIAGNOSIS — E1139 Type 2 diabetes mellitus with other diabetic ophthalmic complication: Secondary | ICD-10-CM | POA: Diagnosis not present

## 2012-09-08 DIAGNOSIS — Z862 Personal history of diseases of the blood and blood-forming organs and certain disorders involving the immune mechanism: Secondary | ICD-10-CM | POA: Diagnosis not present

## 2012-09-08 DIAGNOSIS — Z8719 Personal history of other diseases of the digestive system: Secondary | ICD-10-CM | POA: Diagnosis not present

## 2012-09-08 DIAGNOSIS — M6281 Muscle weakness (generalized): Secondary | ICD-10-CM | POA: Diagnosis not present

## 2012-09-08 DIAGNOSIS — I119 Hypertensive heart disease without heart failure: Secondary | ICD-10-CM | POA: Diagnosis not present

## 2012-09-08 DIAGNOSIS — Z853 Personal history of malignant neoplasm of breast: Secondary | ICD-10-CM | POA: Diagnosis not present

## 2012-09-08 DIAGNOSIS — I10 Essential (primary) hypertension: Secondary | ICD-10-CM | POA: Diagnosis not present

## 2012-09-08 DIAGNOSIS — E119 Type 2 diabetes mellitus without complications: Secondary | ICD-10-CM | POA: Diagnosis not present

## 2012-09-08 DIAGNOSIS — Z792 Long term (current) use of antibiotics: Secondary | ICD-10-CM | POA: Diagnosis not present

## 2012-09-08 DIAGNOSIS — Z5189 Encounter for other specified aftercare: Secondary | ICD-10-CM | POA: Diagnosis not present

## 2012-09-08 DIAGNOSIS — R112 Nausea with vomiting, unspecified: Secondary | ICD-10-CM | POA: Diagnosis not present

## 2012-09-08 DIAGNOSIS — E11319 Type 2 diabetes mellitus with unspecified diabetic retinopathy without macular edema: Secondary | ICD-10-CM | POA: Diagnosis not present

## 2012-09-08 DIAGNOSIS — T889XXS Complication of surgical and medical care, unspecified, sequela: Secondary | ICD-10-CM | POA: Diagnosis not present

## 2012-09-08 DIAGNOSIS — A4902 Methicillin resistant Staphylococcus aureus infection, unspecified site: Secondary | ICD-10-CM | POA: Diagnosis not present

## 2012-09-08 DIAGNOSIS — Z79899 Other long term (current) drug therapy: Secondary | ICD-10-CM | POA: Diagnosis not present

## 2012-09-08 DIAGNOSIS — M1990 Unspecified osteoarthritis, unspecified site: Secondary | ICD-10-CM | POA: Diagnosis not present

## 2012-09-08 DIAGNOSIS — D649 Anemia, unspecified: Secondary | ICD-10-CM | POA: Diagnosis not present

## 2012-09-08 DIAGNOSIS — IMO0002 Reserved for concepts with insufficient information to code with codable children: Secondary | ICD-10-CM | POA: Diagnosis not present

## 2012-09-08 DIAGNOSIS — T8450XA Infection and inflammatory reaction due to unspecified internal joint prosthesis, initial encounter: Secondary | ICD-10-CM | POA: Diagnosis not present

## 2012-09-08 DIAGNOSIS — D62 Acute posthemorrhagic anemia: Secondary | ICD-10-CM | POA: Diagnosis not present

## 2012-09-08 DIAGNOSIS — Z87448 Personal history of other diseases of urinary system: Secondary | ICD-10-CM | POA: Diagnosis not present

## 2012-09-08 DIAGNOSIS — Z7982 Long term (current) use of aspirin: Secondary | ICD-10-CM | POA: Diagnosis not present

## 2012-09-08 DIAGNOSIS — E1169 Type 2 diabetes mellitus with other specified complication: Secondary | ICD-10-CM | POA: Diagnosis not present

## 2012-09-08 DIAGNOSIS — R1312 Dysphagia, oropharyngeal phase: Secondary | ICD-10-CM | POA: Diagnosis not present

## 2012-09-08 DIAGNOSIS — E785 Hyperlipidemia, unspecified: Secondary | ICD-10-CM | POA: Diagnosis not present

## 2012-09-08 DIAGNOSIS — M009 Pyogenic arthritis, unspecified: Secondary | ICD-10-CM | POA: Diagnosis not present

## 2012-09-08 DIAGNOSIS — G40909 Epilepsy, unspecified, not intractable, without status epilepticus: Secondary | ICD-10-CM | POA: Diagnosis not present

## 2012-09-08 DIAGNOSIS — E669 Obesity, unspecified: Secondary | ICD-10-CM | POA: Diagnosis not present

## 2012-09-08 DIAGNOSIS — N179 Acute kidney failure, unspecified: Secondary | ICD-10-CM | POA: Diagnosis not present

## 2012-09-08 DIAGNOSIS — R269 Unspecified abnormalities of gait and mobility: Secondary | ICD-10-CM | POA: Diagnosis not present

## 2012-09-08 DIAGNOSIS — R6889 Other general symptoms and signs: Secondary | ICD-10-CM | POA: Diagnosis not present

## 2012-09-08 DIAGNOSIS — Z96659 Presence of unspecified artificial knee joint: Secondary | ICD-10-CM | POA: Diagnosis not present

## 2012-09-08 DIAGNOSIS — E876 Hypokalemia: Secondary | ICD-10-CM | POA: Diagnosis not present

## 2012-09-08 DIAGNOSIS — Z5181 Encounter for therapeutic drug level monitoring: Secondary | ICD-10-CM | POA: Diagnosis not present

## 2012-09-08 DIAGNOSIS — G909 Disorder of the autonomic nervous system, unspecified: Secondary | ICD-10-CM | POA: Diagnosis not present

## 2012-09-08 LAB — BASIC METABOLIC PANEL
BUN: 10 mg/dL (ref 6–23)
CO2: 23 mEq/L (ref 19–32)
Calcium: 8.9 mg/dL (ref 8.4–10.5)
Chloride: 105 mEq/L (ref 96–112)
Creatinine, Ser: 2.03 mg/dL — ABNORMAL HIGH (ref 0.50–1.10)
GFR calc Af Amer: 28 mL/min — ABNORMAL LOW (ref >90)
GFR calc non Af Amer: 25 mL/min — ABNORMAL LOW (ref >90)
Glucose, Bld: 82 mg/dL (ref 70–99)
Potassium: 3.5 mEq/L (ref 3.5–5.1)
Sodium: 139 mEq/L (ref 135–145)

## 2012-09-08 LAB — GLUCOSE, CAPILLARY
Glucose-Capillary: 95 mg/dL (ref 70–99)
Glucose-Capillary: 86 mg/dL (ref 70–99)
Glucose-Capillary: 91 mg/dL (ref 70–99)

## 2012-09-08 LAB — HEMOGLOBIN AND HEMATOCRIT, BLOOD
HCT: 23.4 % — ABNORMAL LOW (ref 36.0–46.0)
Hemoglobin: 8 g/dL — ABNORMAL LOW (ref 12.0–15.0)

## 2012-09-08 LAB — ANAEROBIC CULTURE: Gram Stain: NONE SEEN

## 2012-09-08 MED ORDER — METHOCARBAMOL 500 MG PO TABS: 500.0000 mg | ORAL_TABLET | Freq: Four times a day (QID) | ORAL | Status: DC | PRN

## 2012-09-08 MED ORDER — ENOXAPARIN SODIUM 30 MG/0.3ML ~~LOC~~ SOLN: 30.0000 mg | Freq: Every day | SUBCUTANEOUS | Status: DC

## 2012-09-08 MED ORDER — SODIUM CHLORIDE 0.9 % IV BOLUS (SEPSIS)
1000.0000 mL | Freq: Once | INTRAVENOUS | Status: AC
  Administered 2012-09-08: 1000 mL via INTRAVENOUS

## 2012-09-08 MED ORDER — HYDROMORPHONE HCL 2 MG PO TABS: 2.0000 mg | ORAL_TABLET | ORAL | Status: DC | PRN

## 2012-09-08 MED ORDER — RIFAMPIN 300 MG PO CAPS: 300.0000 mg | ORAL_CAPSULE | Freq: Every day | ORAL | Status: DC

## 2012-09-08 MED ORDER — AMLODIPINE BESYLATE 10 MG PO TABS
10.0000 mg | ORAL_TABLET | Freq: Every day | ORAL | Status: DC
  Administered 2012-09-08: 10 mg via ORAL
  Filled 2012-09-08 (×2): qty 1

## 2012-09-08 MED ORDER — AMLODIPINE BESYLATE 10 MG PO TABS: 10.0000 mg | ORAL_TABLET | Freq: Every day | ORAL | Status: DC

## 2012-09-08 MED ORDER — VANCOMYCIN HCL IN DEXTROSE 1-5 GM/200ML-% IV SOLN: 1000.0000 mg | INTRAVENOUS | Status: DC

## 2012-09-08 MED ORDER — ASPIRIN EC 325 MG PO TBEC: 325.0000 mg | DELAYED_RELEASE_TABLET | Freq: Two times a day (BID) | ORAL | Status: DC

## 2012-09-08 MED ORDER — HEPARIN SOD (PORK) LOCK FLUSH 100 UNIT/ML IV SOLN
250.0000 [IU] | INTRAVENOUS | Status: AC | PRN
  Administered 2012-09-08: 500 [IU]

## 2012-09-08 NOTE — Progress Notes (Signed)
Clinical social worker assisted with patient discharge to skilled nursing facility, [facility].  CSW addressed all family questions and concerns. CSW copied chart and added all important documents. CSW also set up patient transportation with [facility]. Clinical Social Worker will sign off for now as social work intervention is no longer needed.  Tyreonna Czaplicki, MSW, [phone] 

## 2012-09-08 NOTE — Discharge Summary (Signed)
Patient ID: [name] MRN: [id number] DOB/AGE: 1945-07-17 67 y.o.  Admit date: 08/31/2012 Discharge date: 09/08/2012  Admission Diagnoses:  Principal Problem:   Infection of prosthetic knee joint Active Problems:   DIABETES MELLITUS, TYPE II   Discharge Diagnoses:  Same  Past Medical History  Diagnosis Date  . Allergy   . Hypertension   . Hyperlipidemia   . Fatty liver disease, nonalcoholic   . History of hyperkalemia   . Obesity   . Renal lesion   . Hx antineoplastic chemo  10/12 - 06/24/11    PACLITAX EL COMPLETED 06/24/11  . Anemia   . Diabetes mellitus ORAL MED  . OA (osteoarthritis) of knee     RIGHT LEG  . Mild nonproliferative diabetic retinopathy of right eye 03/19/11    Dr. Norva Pavlov  . Breast CA     (Rt) breast ca dx 4/12---  S/P RADICAL MASTECTOMY AND CHEMORADIATION  . Numbness and tingling     Hx: of in fingers and toes since chemotherapy    Surgeries: Procedure(s): IRRIGATION AND DEBRIDEMENT RIGHT KNEE WITH POLY EXCHANGE on 08/31/2012 - 09/03/2012   Consultants:    Discharged Condition: Improved  Hospital Course: [name] is an 67 y.o. female who was admitted 08/31/2012 for operative treatment ofInfection of prosthetic knee joint. Patient has severe unremitting pain that affects sleep, daily activities, and work/hobbies. After pre-op clearance the patient was taken to the operating room on 08/31/2012 - 09/03/2012 and underwent  Procedure(s): IRRIGATION AND DEBRIDEMENT RIGHT KNEE WITH POLY EXCHANGE.    Patient was given perioperative antibiotics: Anti-infectives   Start     Dose/Rate Route Frequency Ordered Stop   09/08/12 0000  vancomycin (VANCOCIN) 1 GM/200ML SOLN     1,000 mg 200 mL/hr over 60 Minutes Intravenous Every 48 hours 09/08/12 0819     09/08/12 0000  rifampin (RIFADIN) 300 MG capsule     300 mg Oral Daily 09/08/12 0819     09/07/12 2000  vancomycin (VANCOCIN) IVPB 1000 mg/200 mL premix     1,000 mg 200 mL/hr over 60  Minutes Intravenous Every 48 hours 09/07/12 1007     09/05/12 1200  ceFAZolin (ANCEF) IVPB 2 g/50 mL premix  Status:  Discontinued     2 g 100 mL/hr over 30 Minutes Intravenous Every 12 hours 09/05/12 1034 09/05/12 1303   09/03/12 1800  rifampin (RIFADIN) capsule 300 mg     300 mg Oral Daily 09/03/12 1551     09/03/12 1730  cefTRIAXone (ROCEPHIN) 1 g in dextrose 5 % 50 mL IVPB  Status:  Discontinued     1 g 100 mL/hr over 30 Minutes Intravenous Every 24 hours 09/03/12 1551 09/05/12 1034   09/03/12 1600  vancomycin (VANCOCIN) IVPB 1000 mg/200 mL premix  Status:  Discontinued     1,000 mg 200 mL/hr over 60 Minutes Intravenous Every 12 hours 09/03/12 1556 09/03/12 1559   09/03/12 1157  gentamicin (GARAMYCIN) injection  Status:  Discontinued       As needed 09/03/12 1200 09/03/12 1302   09/03/12 1150  vancomycin (VANCOCIN) powder  Status:  Discontinued       As needed 09/03/12 1157 09/03/12 1302   09/01/12 0600  ceFAZolin (ANCEF) IVPB 2 g/50 mL premix     2 g 100 mL/hr over 30 Minutes Intravenous On call to O.R. 08/31/12 2007 09/02/12 0559   08/31/12 1400  vancomycin (VANCOCIN) IVPB 1000 mg/200 mL premix  Status:  Discontinued     1,000  mg 200 mL/hr over 60 Minutes Intravenous Every 8 hours 08/31/12 1204 09/04/12 1022       Patient was given sequential compression devices, early ambulation, and chemoprophylaxis to prevent DVT.  Patient benefited maximally from hospital stay and there were no complications.    Recent vital signs: Patient Vitals for the past 24 hrs:  BP Temp Temp src Pulse Resp SpO2 Weight  09/07/12 2055 180/71 mmHg 98.9 F (37.2 C) Oral 97 18 100 % -  09/07/12 1918 155/73 mmHg 99.2 F (37.3 C) Oral 68 20 100 % -  09/07/12 1049 - - - - - - 72.122 kg (159 lb)  09/07/12 1010 - - - - - - 73.074 kg (161 lb 1.6 oz)     Recent laboratory studies:  Recent Labs  09/05/12 1000 09/06/12 0500 09/07/12 0515 09/08/12 0533  WBC 6.1 5.3  --   --   HGB 8.6* 7.5*  --   --    HCT 24.8* 21.1*  --   --   PLT 281 254  --   --   NA 137 141 139 139  K 3.9 4.0 4.1 3.5  CL 102 108 106 105  CO2 27 24 24 23   BUN 21 16 13 10   CREATININE 2.26* 2.17* 2.13* 2.03*  GLUCOSE 136* 110* 127* 82  CALCIUM 9.0 8.7 9.3 8.9     Discharge Medications:     Medication List    STOP taking these medications       glipiZIDE 10 MG tablet  Commonly known as:  GLUCOTROL     hydrochlorothiazide 25 MG tablet  Commonly known as:  HYDRODIURIL     lisinopril 40 MG tablet  Commonly known as:  PRINIVIL,ZESTRIL     traMADol 50 MG tablet  Commonly known as:  ULTRAM      TAKE these medications       anastrozole 1 MG tablet  Commonly known as:  ARIMIDEX  Take 1 mg by mouth at bedtime.     aspirin EC 325 MG tablet  Take 1 tablet (325 mg total) by mouth 2 (two) times daily.     diphenhydrAMINE 25 mg capsule  Commonly known as:  BENADRYL  Take 1 capsule (25 mg total) by mouth every 4 (four) hours as needed for itching.     HYDROmorphone 2 MG tablet  Commonly known as:  DILAUDID  Take 1-2 tablets (2-4 mg total) by mouth every 4 (four) hours as needed for pain.     metFORMIN 500 MG tablet  Commonly known as:  GLUCOPHAGE  Take 1 tablet (500 mg total) by mouth 2 (two) times daily with a meal.     methocarbamol 500 MG tablet  Commonly known as:  ROBAXIN  Take 1 tablet (500 mg total) by mouth every 6 (six) hours as needed.     metoprolol succinate 25 MG 24 hr tablet  Commonly known as:  TOPROL XL  Take 1 tablet (25 mg total) by mouth daily.     pravastatin 40 MG tablet  Commonly known as:  PRAVACHOL  Take 1 tablet (40 mg total) by mouth at bedtime.     REFRESH OP  Place 1 drop into both eyes daily as needed. Dry eyes     rifampin 300 MG capsule  Commonly known as:  RIFADIN  Take 1 capsule (300 mg total) by mouth daily.     vancomycin 1 GM/200ML Soln  Commonly known as:  VANCOCIN  Inject 200 mLs (1,000 mg  total) into the vein every other day.        Diagnostic  Studies: Dg Chest 2 View  08/09/2012  *RADIOLOGY REPORT*  Clinical Data: Preop for right knee replacement  CHEST - 2 VIEW  Comparison: 06/14/2012  Findings: Cardiomediastinal silhouette is stable.  Surgical clips in the right axilla.  No acute infiltrate or pleural effusion.  No pulmonary edema.  Degenerative changes thoracic spine.  IMPRESSION: No active disease.  Degenerative changes thoracic spine.   Original Report Authenticated By: Natasha Mead, M.D.    US Renal  09/05/2012  *RADIOLOGY REPORT*  Clinical Data: Acute renal failure.  History of diabetes, hypertension and hysterectomy.  RENAL/URINARY TRACT ULTRASOUND COMPLETE  Comparison:  Abdominal CT 04/20/2007.  PET CT 10/09/2010.  Findings:  Right Kidney:  Mild prominence of the right renal pelvis is stable. There is no hydronephrosis.  Renal cortical thickness and echogenicity are preserved.  There is no focal abnormality.  Renal length 10.4 cm.  Left Kidney:  No hydronephrosis.  Well-preserved cortex.  Normal size and parenchymal echotexture without focal abnormalities. Renal length 11.2 cm.  Bladder:  Unremarkable.  Bilateral ureteral jets are noted.  IMPRESSION: Normal renal ultrasound.  No hydronephrosis.   Original Report Authenticated By: Carey Bullocks, M.D.    Ir Fluoro Guide Cv Line Left  09/07/2012  *RADIOLOGY REPORT*  Clinical Data: Right knee infection  PICC LINE PLACEMENT WITH ULTRASOUND AND FLUOROSCOPIC  GUIDANCE  Fluoroscopy Time: 42 seconds.  The left arm was prepped with chlorhexidine, draped in the usual sterile fashion using maximum barrier technique (cap and mask, sterile gown, sterile gloves, large sterile sheet, hand hygiene and cutaneous antisepsis) and infiltrated locally with 1% Lidocaine.  Ultrasound demonstrated patency of the left basilic vein, and this was documented with an image.  Under real-time ultrasound guidance, this vein was accessed with a 21 gauge micropuncture needle and image documentation was performed.  The needle  was exchanged over a guidewire for a peel-away sheath through which a five [place] single lumen PICC trimmed to 50 cm was advanced, positioned with its tip at the lower SVC/right atrial junction.  Fluoroscopy during the procedure and fluoro spot radiograph confirms appropriate catheter position.  The catheter was flushed, secured to the skin with Prolene sutures, and covered with a sterile dressing.  Complications:  None.  IMPRESSION: Successful left arm PICC line placement with ultrasound and fluoroscopic guidance.  The catheter is ready for use.   Original Report Authenticated By: Jolaine Click, M.D.    Ir US Guide Vasc Access Left  09/07/2012  *RADIOLOGY REPORT*  Clinical Data: Right knee infection  PICC LINE PLACEMENT WITH ULTRASOUND AND FLUOROSCOPIC  GUIDANCE  Fluoroscopy Time: 42 seconds.  The left arm was prepped with chlorhexidine, draped in the usual sterile fashion using maximum barrier technique (cap and mask, sterile gown, sterile gloves, large sterile sheet, hand hygiene and cutaneous antisepsis) and infiltrated locally with 1% Lidocaine.  Ultrasound demonstrated patency of the left basilic vein, and this was documented with an image.  Under real-time ultrasound guidance, this vein was accessed with a 21 gauge micropuncture needle and image documentation was performed.  The needle was exchanged over a guidewire for a peel-away sheath through which a five [place] single lumen PICC trimmed to 50 cm was advanced, positioned with its tip at the lower SVC/right atrial junction.  Fluoroscopy during the procedure and fluoro spot radiograph confirms appropriate catheter position.  The catheter was flushed, secured to the skin with Prolene sutures, and covered with  a sterile dressing.  Complications:  None.  IMPRESSION: Successful left arm PICC line placement with ultrasound and fluoroscopic guidance.  The catheter is ready for use.   Original Report Authenticated By: Jolaine Click, M.D.    Dg Chest Port 1  View  09/04/2012  *RADIOLOGY REPORT*  Clinical Data: Evaluate central line placement  PORTABLE CHEST - 1 VIEW  Comparison: Prior chest x-ray 08/09/2012  Findings: Interval placement of a left IJ approach central venous catheter.  The catheter tip projects over the upper superior vena cava.  No evidence of pneumothorax.  Surgical changes of prior right mastectomy and axillary nodal dissection.  Cardiac and mediastinal contours are within normal limits.  The lungs are clear.  Degenerative changes again noted throughout the thoracic spine.  Query bilateral cervical ribs.  IMPRESSION: The tip of the left IJ catheter projects over the upper superior vena cava.  No evidence of complicating pneumothorax.   Original Report Authenticated By: Malachy Moan, M.D.     Disposition: 01-Home or Self Care      Discharge Orders   Future Appointments Provider Department Dept Phone   09/22/2012 9:00 AM Judyann Munson, MD [facility] for Infectious Disease [phone]   11/29/2012 9:00 AM Delcie Roch [facility] [phone]   12/06/2012 9:00 AM Lowella Dell, MD [facility] MEDICAL ONCOLOGY [phone]   Future Orders Complete By Expires     Call MD for:  redness, tenderness, or signs of infection (pain, swelling, redness, odor or green/yellow discharge around incision site)  As directed     Call MD for:  severe uncontrolled pain  As directed     Call MD for:  temperature >100.4  As directed     Change dressing (specify)  As directed     Comments:      Dressing change as needed.    Discharge instructions  As directed     Comments:      F/U with Dr. Turner Daniels as scheduled (POD #14).    Driving Restrictions  As directed     Comments:      No driving for 2 weeks.    Increase activity slowly  As directed     May shower / Bathe  As directed     Walker   As directed           Signed: Mata Rowen M. 09/08/2012, 8:48 AM

## 2012-09-08 NOTE — Progress Notes (Signed)
Internal Medicine Teaching Service Attending Note Date: 09/08/2012  Patient name: [name]  Medical record number: [id number]  Date of birth: 04/12/1946    This patient has been seen and discussed with the house staff. Please see their note for complete details. I concur with their findings with the following additions/corrections: Patient likely to be discharged to skilled nursing facility. I agree with the recommendations made by Dr. Dierdre Searles in her note.  Lars Mage 09/08/2012, 3:57 PM

## 2012-09-08 NOTE — Progress Notes (Addendum)
Subjective: Patient ok. No c/o.  Oral fluid intake was 820 ml last 24 h. Received 2 L IVF yesterday. good amount urine ~2000 ml daily. No acute events overnight.   Objective: Vital signs in last 24 hours: Filed Vitals:   09/07/12 1010 09/07/12 1049 09/07/12 1918 09/07/12 2055  BP:   155/73 180/71  Pulse:   68 97  Temp:   99.2 F (37.3 C) 98.9 F (37.2 C)  TempSrc:   Oral Oral  Resp:   20 18  Height:      Weight: 161 lb 1.6 oz (73.074 kg) 159 lb (72.122 kg)    SpO2:   100% 100%   Weight change:   Intake/Output Summary (Last 24 hours) at 09/08/12 1110 Last data filed at 09/08/12 0915  Gross per 24 hour  Intake   1045 ml  Output   1950 ml  Net   -905 ml   General: resting in bed, some acute distress  HEENT: PERRL, EOMI, no scleral icterus, dry MM  Cardiac: RRR, no rubs, murmurs or gallops  Pulm: clear to auscultation bilaterally, moving normal volumes of air  Abd: soft, nontender, nondistended, BS present  Ext: warm and well perfused, no pedal edema, right total knee immobilizer in place with wound vac draining bloody serosanguinous fluid  Neuro: alert and oriented X3, cranial nerves II-XII grossly intact   Lab Results: Basic Metabolic Panel:  Recent Labs Lab 09/07/12 0515 09/08/12 0533  Ming Mcmannis 139 139  K 4.1 3.5  CL 106 105  CO2 24 23  GLUCOSE 127* 82  BUN 13 10  CREATININE 2.13* 2.03*  CALCIUM 9.3 8.9   Liver Function Tests: CBC:  Recent Labs Lab 09/05/12 1000 09/06/12 0500  WBC 6.1 5.3  HGB 8.6* 7.5*  HCT 24.8* 21.1*  MCV 84.1 84.1  PLT 281 254    CBG:  Recent Labs Lab 09/06/12 2107 09/07/12 0704 09/07/12 1142 09/07/12 1654 09/07/12 2236 09/08/12 0653  GLUCAP 100* 98 129* 75 89 95   Anemia Panel:  Recent Labs Lab 09/06/12 1400  VITAMINB12 687  FOLATE 19.8  FERRITIN 732*  TIBC 195*  IRON 27*  RETICCTPCT 1.2   Urinalysis:  Recent Labs Lab 09/05/12 1255  COLORURINE YELLOW  LABSPEC 1.007  PHURINE 5.0  GLUCOSEU NEGATIVE  HGBUR  NEGATIVE  BILIRUBINUR NEGATIVE  KETONESUR NEGATIVE  PROTEINUR NEGATIVE  UROBILINOGEN 1.0  NITRITE NEGATIVE  LEUKOCYTESUR NEGATIVE   Micro Results: Recent Results (from the past 240 hour(s))  GRAM STAIN     Status: None   Collection Time    09/01/12  7:30 AM      Result Value Range Status   Specimen Description FLUID SYNOVIAL RIGHT KNEE   Final   Special Requests PATIENT ON FOLLOWING VANCO   Final   Gram Stain     Final   Value: RARE WBC PRESENT, PREDOMINANTLY PMN     NO ORGANISMS SEEN     VERBAL REPORT GIVEN TO DR. Turner Daniels 09/01/12 0715   Report Status 09/01/2012 FINAL   Final  BODY FLUID CULTURE     Status: None   Collection Time    09/01/12  7:30 AM      Result Value Range Status   Specimen Description FLUID SYNOVIAL RIGHT KNEE   Final   Special Requests PATIENT ON FOLLOWING VANCO   Final   Gram Stain     Final   Value: ABUNDANT WBC PRESENT, PREDOMINANTLY PMN     NO ORGANISMS SEEN   Culture NO  GROWTH 3 DAYS   Final   Report Status 09/04/2012 FINAL   Final  WOUND CULTURE     Status: None   Collection Time    09/03/12 12:08 PM      Result Value Range Status   Specimen Description WOUND   Final   Special Requests RIGHT TOTAL KNEE PT ON VANCO   Final   Gram Stain     Final   Value: NO WBC SEEN     NO SQUAMOUS EPITHELIAL CELLS SEEN     NO ORGANISMS SEEN   Culture     Final   Value: FEW METHICILLIN RESISTANT STAPHYLOCOCCUS AUREUS     Note: RIFAMPIN AND GENTAMICIN SHOULD NOT BE USED AS SINGLE DRUGS FOR TREATMENT OF STAPH INFECTIONS. CRITICAL RESULT CALLED TO, READ BACK BY AND VERIFIED WITH: TONYA B@1055  ON [id number] BY [facility] This organism DOES NOT demonstrate inducible Clindamycin      resistance in vitro.   Report Status 09/06/2012 FINAL   Final   Organism ID, Bacteria METHICILLIN RESISTANT STAPHYLOCOCCUS AUREUS   Final  ANAEROBIC CULTURE     Status: None   Collection Time    09/03/12 12:08 PM      Result Value Range Status   Specimen Description WOUND   Final   Special  Requests RIGHT TOTAL KNEE PT ON VANCO   Final   Gram Stain     Final   Value: NO WBC SEEN     NO SQUAMOUS EPITHELIAL CELLS SEEN     NO ORGANISMS SEEN   Culture NO ANAEROBES ISOLATED   Final   Report Status 09/08/2012 FINAL   Final   Studies/Results: Ir Fluoro Guide Cv Line Left  09/07/2012  *RADIOLOGY REPORT*  Clinical Data: Right knee infection  PICC LINE PLACEMENT WITH ULTRASOUND AND FLUOROSCOPIC  GUIDANCE  Fluoroscopy Time: 42 seconds.  The left arm was prepped with chlorhexidine, draped in the usual sterile fashion using maximum barrier technique (cap and mask, sterile gown, sterile gloves, large sterile sheet, hand hygiene and cutaneous antisepsis) and infiltrated locally with 1% Lidocaine.  Ultrasound demonstrated patency of the left basilic vein, and this was documented with an image.  Under real-time ultrasound guidance, this vein was accessed with a 21 gauge micropuncture needle and image documentation was performed.  The needle was exchanged over a guidewire for a peel-away sheath through which a five [place] single lumen PICC trimmed to 50 cm was advanced, positioned with its tip at the lower SVC/right atrial junction.  Fluoroscopy during the procedure and fluoro spot radiograph confirms appropriate catheter position.  The catheter was flushed, secured to the skin with Prolene sutures, and covered with a sterile dressing.  Complications:  None.  IMPRESSION: Successful left arm PICC line placement with ultrasound and fluoroscopic guidance.  The catheter is ready for use.   Original Report Authenticated By: Jolaine Click, M.D.    Ir US Guide Vasc Access Left  09/07/2012  *RADIOLOGY REPORT*  Clinical Data: Right knee infection  PICC LINE PLACEMENT WITH ULTRASOUND AND FLUOROSCOPIC  GUIDANCE  Fluoroscopy Time: 42 seconds.  The left arm was prepped with chlorhexidine, draped in the usual sterile fashion using maximum barrier technique (cap and mask, sterile gown, sterile gloves, large sterile sheet,  hand hygiene and cutaneous antisepsis) and infiltrated locally with 1% Lidocaine.  Ultrasound demonstrated patency of the left basilic vein, and this was documented with an image.  Under real-time ultrasound guidance, this vein was accessed with a 21 gauge micropuncture needle and image  documentation was performed.  The needle was exchanged over a guidewire for a peel-away sheath through which a five [place] single lumen PICC trimmed to 50 cm was advanced, positioned with its tip at the lower SVC/right atrial junction.  Fluoroscopy during the procedure and fluoro spot radiograph confirms appropriate catheter position.  The catheter was flushed, secured to the skin with Prolene sutures, and covered with a sterile dressing.  Complications:  None.  IMPRESSION: Successful left arm PICC line placement with ultrasound and fluoroscopic guidance.  The catheter is ready for use.   Original Report Authenticated By: Jolaine Click, M.D.    Medications: I have reviewed the patient's current medications. Scheduled Meds: . amLODipine  10 mg Oral Daily  . anastrozole  1 mg Oral QHS  . aspirin EC  325 mg Oral Q breakfast  . docusate sodium  100 mg Oral BID  . insulin aspart  0-15 Units Subcutaneous TID WC  . metoprolol succinate  25 mg Oral Daily  . rifampin  300 mg Oral Daily  . senna  1 tablet Oral BID WC  . simvastatin  20 mg Oral q1800  . sodium chloride  1,000 mL Intravenous Once  . sodium chloride  10-40 mL Intracatheter Q12H  . vancomycin  1,000 mg Intravenous Q48H   Continuous Infusions:  PRN Meds:.acetaminophen, acetaminophen, alum & mag hydroxide-simeth, bisacodyl, diphenhydrAMINE, diphenhydrAMINE, HYDROcodone-acetaminophen, HYDROcodone-acetaminophen, HYDROmorphone (DILAUDID) injection, HYDROmorphone (DILAUDID) injection, magnesium hydroxide, menthol-cetylpyridinium, methocarbamol (ROBAXIN) IV, methocarbamol, methocarbamol, ondansetron, phenol, promethazine, promethazine, promethazine, sodium  chloride Assessment/Plan:  1.Acute Renal failure/ATN likely multifactorial: (baseline Cr 0.8-0.9)   Recent Labs Lab 09/04/12 0510 09/05/12 1000 09/06/12 0500 09/07/12 0515 09/08/12 0533  CREATININE 2.07* 2.26* 2.17* 2.13* 2.03*   - Renal function improving. I have curbsided Dr. Gloris Ham who states that patient could be discharged home if the kidney function trending down. Patient will need close outpatient follow up.  - agree with the discharge plan per primary team - Will give additional 2 L fluid bolus before discharge - discharge instruction from standpoint  1). Avoid oral glyburide, metformin, HCTZ and Lisinopril until her renal function is recovered. 2). Avoid any form of OTC or prescription NSAIDs 3). Encourage oral intake for better kidney perfusion. 4). Close follow up with the outpatient clinic with Dr. Loistine Chance at 115 pm on Friday 09/10/12.  5). Will add amlodipine 10 mg po daily while her HCTZ and Lisinopril are on hold.    I have discussed with Ortho PA about above meds changes.   2. HTN: stable. Continue to monitor  -hold lisinopril and HCTZ  - continue Metoprolol upon discharge - add Amlodipine for discharge  3. Acute anemia + anemia chronic disease: Primary team follow.   Dispo: Disposition is deferred at this time, awaiting improvement of current medical problems.   The patient does have a current PCP (SHARDA, NEEMA, MD), therefore is requiring OPC follow-up after discharge.   The patient does not have transportation limitations that hinder transportation to clinic appointments.  .Services Needed at time of discharge: Y = Yes, Blank = No PT:   OT:   RN:   Equipment:   Other: SNF per ortho    LOS: 8 days   Monta Police 09/08/2012, 11:10 AM

## 2012-09-08 NOTE — Progress Notes (Signed)
Pt discharged to [facility] (SNF). Report called to RN at [facility]. Family is transporting pt to nursing facility. Pt left unit in a stable condition via wheelchair.

## 2012-09-08 NOTE — Progress Notes (Signed)
INFECTIOUS DISEASE PROGRESS NOTE  ID: [name] is a 67 y.o. female with  Principal Problem:   Infection of prosthetic knee joint Active Problems:   DIABETES MELLITUS, TYPE II  Subjective: Without complaints  Abtx:  Anti-infectives   Start     Dose/Rate Route Frequency Ordered Stop   09/08/12 0000  vancomycin (VANCOCIN) 1 GM/200ML SOLN     1,000 mg 200 mL/hr over 60 Minutes Intravenous Every 48 hours 09/08/12 0819     09/08/12 0000  rifampin (RIFADIN) 300 MG capsule     300 mg Oral Daily 09/08/12 0819     09/07/12 2000  vancomycin (VANCOCIN) IVPB 1000 mg/200 mL premix     1,000 mg 200 mL/hr over 60 Minutes Intravenous Every 48 hours 09/07/12 1007     09/05/12 1200  ceFAZolin (ANCEF) IVPB 2 g/50 mL premix  Status:  Discontinued     2 g 100 mL/hr over 30 Minutes Intravenous Every 12 hours 09/05/12 1034 09/05/12 1303   09/03/12 1800  rifampin (RIFADIN) capsule 300 mg     300 mg Oral Daily 09/03/12 1551     09/03/12 1730  cefTRIAXone (ROCEPHIN) 1 g in dextrose 5 % 50 mL IVPB  Status:  Discontinued     1 g 100 mL/hr over 30 Minutes Intravenous Every 24 hours 09/03/12 1551 09/05/12 1034   09/03/12 1600  vancomycin (VANCOCIN) IVPB 1000 mg/200 mL premix  Status:  Discontinued     1,000 mg 200 mL/hr over 60 Minutes Intravenous Every 12 hours 09/03/12 1556 09/03/12 1559   09/03/12 1157  gentamicin (GARAMYCIN) injection  Status:  Discontinued       As needed 09/03/12 1200 09/03/12 1302   09/03/12 1150  vancomycin (VANCOCIN) powder  Status:  Discontinued       As needed 09/03/12 1157 09/03/12 1302   09/01/12 0600  ceFAZolin (ANCEF) IVPB 2 g/50 mL premix     2 g 100 mL/hr over 30 Minutes Intravenous On call to O.R. 08/31/12 2007 09/02/12 0559   08/31/12 1400  vancomycin (VANCOCIN) IVPB 1000 mg/200 mL premix  Status:  Discontinued     1,000 mg 200 mL/hr over 60 Minutes Intravenous Every 8 hours 08/31/12 1204 09/04/12 1022      Medications:  Scheduled: . amLODipine  10 mg  Oral Daily  . anastrozole  1 mg Oral QHS  . aspirin EC  325 mg Oral Q breakfast  . docusate sodium  100 mg Oral BID  . insulin aspart  0-15 Units Subcutaneous TID WC  . metoprolol succinate  25 mg Oral Daily  . rifampin  300 mg Oral Daily  . senna  1 tablet Oral BID WC  . simvastatin  20 mg Oral q1800  . sodium chloride  10-40 mL Intracatheter Q12H  . vancomycin  1,000 mg Intravenous Q48H    Objective: Vital signs in last 24 hours: Temp:  [98.4 F (36.9 C)-99.2 F (37.3 C)] 98.4 F (36.9 C) (05/07 1342) Pulse Rate:  [68-97] 72 (05/07 1342) Resp:  [16-20] 16 (05/07 1342) BP: (155-180)/(71-79) 171/79 mmHg (05/07 1342) SpO2:  [100 %] 100 % (05/07 1342)   General appearance: alert, cooperative and no distress Incision/Wound: dressed.   Lab Results  Recent Labs  09/06/12 0500 09/07/12 0515 09/08/12 0533 09/08/12 1505  WBC 5.3  --   --   --   HGB 7.5*  --   --  8.0*  HCT 21.1*  --   --  23.4*  NA 141  139 139  --   K 4.0 4.1 3.5  --   CL 108 106 105  --   CO2 24 24 23   --   BUN 16 13 10   --   CREATININE 2.17* 2.13* 2.03*  --    Liver Panel No results found for this basename: PROT, ALBUMIN, AST, ALT, ALKPHOS, BILITOT, BILIDIR, IBILI,  in the last 72 hours Sedimentation Rate No results found for this basename: ESRSEDRATE,  in the last 72 hours C-Reactive Protein No results found for this basename: CRP,  in the last 72 hours  Microbiology: Recent Results (from the past 240 hour(s))  GRAM STAIN     Status: None   Collection Time    09/01/12  7:30 AM      Result Value Range Status   Specimen Description FLUID SYNOVIAL RIGHT KNEE   Final   Special Requests PATIENT ON FOLLOWING VANCO   Final   Gram Stain     Final   Value: RARE WBC PRESENT, PREDOMINANTLY PMN     NO ORGANISMS SEEN     VERBAL REPORT GIVEN TO DR. Turner Daniels 09/01/12 0715   Report Status 09/01/2012 FINAL   Final  BODY FLUID CULTURE     Status: None   Collection Time    09/01/12  7:30 AM      Result Value  Range Status   Specimen Description FLUID SYNOVIAL RIGHT KNEE   Final   Special Requests PATIENT ON FOLLOWING VANCO   Final   Gram Stain     Final   Value: ABUNDANT WBC PRESENT, PREDOMINANTLY PMN     NO ORGANISMS SEEN   Culture NO GROWTH 3 DAYS   Final   Report Status 09/04/2012 FINAL   Final  WOUND CULTURE     Status: None   Collection Time    09/03/12 12:08 PM      Result Value Range Status   Specimen Description WOUND   Final   Special Requests RIGHT TOTAL KNEE PT ON VANCO   Final   Gram Stain     Final   Value: NO WBC SEEN     NO SQUAMOUS EPITHELIAL CELLS SEEN     NO ORGANISMS SEEN   Culture     Final   Value: FEW METHICILLIN RESISTANT STAPHYLOCOCCUS AUREUS     Note: RIFAMPIN AND GENTAMICIN SHOULD NOT BE USED AS SINGLE DRUGS FOR TREATMENT OF STAPH INFECTIONS. CRITICAL RESULT CALLED TO, READ BACK BY AND VERIFIED WITH: TONYA B@1055  ON [id number] BY [facility] This organism DOES NOT demonstrate inducible Clindamycin      resistance in vitro.   Report Status 09/06/2012 FINAL   Final   Organism ID, Bacteria METHICILLIN RESISTANT STAPHYLOCOCCUS AUREUS   Final  ANAEROBIC CULTURE     Status: None   Collection Time    09/03/12 12:08 PM      Result Value Range Status   Specimen Description WOUND   Final   Special Requests RIGHT TOTAL KNEE PT ON VANCO   Final   Gram Stain     Final   Value: NO WBC SEEN     NO SQUAMOUS EPITHELIAL CELLS SEEN     NO ORGANISMS SEEN   Culture NO ANAEROBES ISOLATED   Final   Report Status 09/08/2012 FINAL   Final    Studies/Results: Ir Fluoro Guide Cv Line Left  09/07/2012  *RADIOLOGY REPORT*  Clinical Data: Right knee infection  PICC LINE PLACEMENT WITH ULTRASOUND AND FLUOROSCOPIC  GUIDANCE  Fluoroscopy Time:  42 seconds.  The left arm was prepped with chlorhexidine, draped in the usual sterile fashion using maximum barrier technique (cap and mask, sterile gown, sterile gloves, large sterile sheet, hand hygiene and cutaneous antisepsis) and infiltrated locally with  1% Lidocaine.  Ultrasound demonstrated patency of the left basilic vein, and this was documented with an image.  Under real-time ultrasound guidance, this vein was accessed with a 21 gauge micropuncture needle and image documentation was performed.  The needle was exchanged over a guidewire for a peel-away sheath through which a five [place] single lumen PICC trimmed to 50 cm was advanced, positioned with its tip at the lower SVC/right atrial junction.  Fluoroscopy during the procedure and fluoro spot radiograph confirms appropriate catheter position.  The catheter was flushed, secured to the skin with Prolene sutures, and covered with a sterile dressing.  Complications:  None.  IMPRESSION: Successful left arm PICC line placement with ultrasound and fluoroscopic guidance.  The catheter is ready for use.   Original Report Authenticated By: Jolaine Click, M.D.    Ir US Guide Vasc Access Left  09/07/2012  *RADIOLOGY REPORT*  Clinical Data: Right knee infection  PICC LINE PLACEMENT WITH ULTRASOUND AND FLUOROSCOPIC  GUIDANCE  Fluoroscopy Time: 42 seconds.  The left arm was prepped with chlorhexidine, draped in the usual sterile fashion using maximum barrier technique (cap and mask, sterile gown, sterile gloves, large sterile sheet, hand hygiene and cutaneous antisepsis) and infiltrated locally with 1% Lidocaine.  Ultrasound demonstrated patency of the left basilic vein, and this was documented with an image.  Under real-time ultrasound guidance, this vein was accessed with a 21 gauge micropuncture needle and image documentation was performed.  The needle was exchanged over a guidewire for a peel-away sheath through which a five [place] single lumen PICC trimmed to 50 cm was advanced, positioned with its tip at the lower SVC/right atrial junction.  Fluoroscopy during the procedure and fluoro spot radiograph confirms appropriate catheter position.  The catheter was flushed, secured to the skin with Prolene sutures, and  covered with a sterile dressing.  Complications:  None.  IMPRESSION: Successful left arm PICC line placement with ultrasound and fluoroscopic guidance.  The catheter is ready for use.   Original Report Authenticated By: Jolaine Click, M.D.      Assessment/Plan: MRSA infection of TKR ARF DM2 HTN   Total days of antibiotics: vanco/rifampin day 9  Cr slightly better, off NSAIDS, ACE-I.  Not sure where this will settle.... She is being d/c to SNF.  Will have her seen in ID in 2 weeks vanco levels and Cr will need to be closely monitored. I have asked pt to call clinic for appt ( I will also forward note to clinic mgr) and I have also asked pt to have her labs faxed to me ( I have given her the clinic fax #).   [name] Infectious Diseases [phone] 09/08/2012, 4:35 PM   LOS: 8 days

## 2012-09-08 NOTE — Progress Notes (Signed)
Physical Therapy Treatment Patient Details Name: [name] MRN: [id number] DOB: January 25, 1946 Today's Date: 09/08/2012 Time: 4098-1191 PT Time Calculation (min): 20 min  PT Assessment / Plan / Recommendation Comments on Treatment Session  Patient progressing well with ambulation. Will be discharging to SNF as she has 6 weeks of antibiotics that she will be on and does not have assistance during the day at home    Follow Up Recommendations  SNF     Does the patient have the potential to tolerate intense rehabilitation     Barriers to Discharge        Equipment Recommendations  None recommended by PT    Recommendations for Other Services    Frequency Min 3X/week   Plan Discharge plan needs to be updated;Frequency remains appropriate    Precautions / Restrictions Precautions Required Braces or Orthoses: Knee Immobilizer - Right Restrictions Weight Bearing Restrictions: Yes RLE Weight Bearing: Weight bearing as tolerated Other Position/Activity Restrictions: no ROM exercies with RLE, no CPM   Pertinent Vitals/Pain no apparent distress     Mobility  Bed Mobility Bed Mobility: Not assessed Transfers Sit to Stand: 6: Modified independent (Device/Increase time) Stand to Sit: 6: Modified independent (Device/Increase time) Ambulation/Gait Ambulation/Gait Assistance: 5: Supervision Ambulation Distance (Feet): 200 Feet Assistive device: Rolling walker Gait Pattern: Step-through pattern    Exercises     PT Diagnosis:    PT Problem List:   PT Treatment Interventions:     PT Goals Acute Rehab PT Goals PT Goal: Sit to Stand - Progress: Progressing toward goal PT Goal: Ambulate - Progress: Progressing toward goal  Visit Information  Last PT Received On: 09/08/12 Assistance Needed: +1    Subjective Data      Cognition  Cognition Arousal/Alertness: Awake/alert Behavior During Therapy: WFL for tasks assessed/performed Overall Cognitive Status: Within Functional  Limits for tasks assessed    Balance     End of Session PT - End of Session Equipment Utilized During Treatment: Gait belt Activity Tolerance: Patient tolerated treatment well;Other (comment) Patient left: with call bell/phone within reach;in chair Nurse Communication: Mobility status;Other (comment)   GP     Fredrich Birks 09/08/2012, 11:43 AM 09/08/2012 Fredrich Birks PTA [phone] pager [phone] office

## 2012-09-08 NOTE — Progress Notes (Signed)
PATIENT ID: [name]  MRN: [id number]  DOB/AGE:  [birth year] / 67 y.o.  5 Days Post-Op Procedure(s) (LRB): IRRIGATION AND DEBRIDEMENT RIGHT KNEE WITH POLY EXCHANGE (Right)    PROGRESS NOTE Subjective: Patient is alert, oriented, no Nausea, no Vomiting, yes passing gas, no Bowel Movement. Taking PO well. Denies SOB, Chest or Calf Pain. Using Incentive Spirometer, PAS in place. Ambulating slowly with PT, no CPM Patient reports pain as 4 on 0-10 scale  .    Objective: Vital signs in last 24 hours: Filed Vitals:   09/07/12 1010 09/07/12 1049 09/07/12 1918 09/07/12 2055  BP:   155/73 180/71  Pulse:   68 97  Temp:   99.2 F (37.3 C) 98.9 F (37.2 C)  TempSrc:   Oral Oral  Resp:   20 18  Height:      Weight: 73.074 kg (161 lb 1.6 oz) 72.122 kg (159 lb)    SpO2:   100% 100%      Intake/Output from previous day: I/O last 3 completed shifts: In: 82 [P.O.:940; I.V.:340; Other:25; IV Piggyback:200] Out: 3251 [Urine:3250; Stool:1]   Intake/Output this shift:     LABORATORY DATA:  Recent Labs  09/05/12 1000  09/06/12 0500  09/07/12 0515  09/07/12 1654 09/07/12 2236 09/08/12 0533 09/08/12 0653  WBC 6.1  --  5.3  --   --   --   --   --   --   --   HGB 8.6*  --  7.5*  --   --   --   --   --   --   --   HCT 24.8*  --  21.1*  --   --   --   --   --   --   --   PLT 281  --  254  --   --   --   --   --   --   --   NA 137  --  141  --  139  --   --   --  139  --   K 3.9  --  4.0  --  4.1  --   --   --  3.5  --   CL 102  --  108  --  106  --   --   --  105  --   CO2 27  --  24  --  24  --   --   --  23  --   BUN 21  --  16  --  13  --   --   --  10  --   CREATININE 2.26*  --  2.17*  --  2.13*  --   --   --  2.03*  --   GLUCOSE 136*  --  110*  --  127*  --   --   --  82  --   GLUCAP  --   < >  --   < >  --   < > 75 89  --  95  CALCIUM 9.0  --  8.7  --  9.3  --   --   --  8.9  --   < > = values in this interval not displayed.  Examination: Neurologically intact ABD  soft Neurovascular intact Sensation intact distally Intact pulses distally Dorsiflexion/Plantar flexion intact Incision: dressing C/D/I}  Dressing changed today - incision appears benign.  No effusion right knee.  Assessment:  5 Days Post-Op Procedure(s) (LRB): IRRIGATION AND DEBRIDEMENT RIGHT KNEE WITH POLY EXCHANGE (Right) ADDITIONAL DIAGNOSIS:  Renal Insufficiency Acute  Plan: PT/OT WBAT, no CPM, but gentle active ROM is fine. Continue Rifampin and Vancomycin per I.D. DVT Prophylaxis:  SCDx72hrs, ASA 325 mg BID x 2 weeks DISCHARGE PLAN: Skilled Nursing Facility/Rehab when bed available and stable per medicine service and I.D. DISCHARGE NEEDS: HHPT, HHRN, Walker, 3-in-1 comode seat and IV Antibiotics     Rolondo Pierre M. 09/08/2012, 8:12 AM

## 2012-09-10 ENCOUNTER — Ambulatory Visit: Payer: Medicare Other | Admitting: Internal Medicine

## 2012-09-10 ENCOUNTER — Encounter: Payer: Self-pay | Admitting: Internal Medicine

## 2012-09-10 ENCOUNTER — Encounter: Payer: Self-pay | Admitting: Adult Health

## 2012-09-10 ENCOUNTER — Other Ambulatory Visit: Payer: Self-pay | Admitting: *Deleted

## 2012-09-10 MED ORDER — OXYCODONE-ACETAMINOPHEN 5-325 MG PO TABS: ORAL_TABLET | ORAL | Status: DC

## 2012-09-14 ENCOUNTER — Non-Acute Institutional Stay (SKILLED_NURSING_FACILITY): Payer: Medicare Other | Admitting: Internal Medicine

## 2012-09-14 DIAGNOSIS — E1169 Type 2 diabetes mellitus with other specified complication: Secondary | ICD-10-CM

## 2012-09-14 DIAGNOSIS — T889XXS Complication of surgical and medical care, unspecified, sequela: Secondary | ICD-10-CM

## 2012-09-14 DIAGNOSIS — I152 Hypertension secondary to endocrine disorders: Secondary | ICD-10-CM

## 2012-09-14 DIAGNOSIS — E119 Type 2 diabetes mellitus without complications: Secondary | ICD-10-CM | POA: Diagnosis not present

## 2012-09-14 DIAGNOSIS — I10 Essential (primary) hypertension: Secondary | ICD-10-CM

## 2012-09-14 DIAGNOSIS — T8459XS Infection and inflammatory reaction due to other internal joint prosthesis, sequela: Secondary | ICD-10-CM

## 2012-09-14 DIAGNOSIS — D62 Acute posthemorrhagic anemia: Secondary | ICD-10-CM

## 2012-09-14 DIAGNOSIS — E1159 Type 2 diabetes mellitus with other circulatory complications: Secondary | ICD-10-CM

## 2012-09-17 ENCOUNTER — Encounter (HOSPITAL_COMMUNITY): Payer: Self-pay | Admitting: Emergency Medicine

## 2012-09-17 ENCOUNTER — Emergency Department (HOSPITAL_COMMUNITY)
Admission: EM | Admit: 2012-09-17 | Discharge: 2012-09-17 | Disposition: A | Discharge status: Skilled Nursing Facility | Payer: Medicare Other | Attending: Emergency Medicine | Admitting: Emergency Medicine

## 2012-09-17 DIAGNOSIS — Z853 Personal history of malignant neoplasm of breast: Secondary | ICD-10-CM | POA: Insufficient documentation

## 2012-09-17 DIAGNOSIS — E876 Hypokalemia: Secondary | ICD-10-CM | POA: Diagnosis not present

## 2012-09-17 DIAGNOSIS — Z8719 Personal history of other diseases of the digestive system: Secondary | ICD-10-CM | POA: Insufficient documentation

## 2012-09-17 DIAGNOSIS — E1139 Type 2 diabetes mellitus with other diabetic ophthalmic complication: Secondary | ICD-10-CM | POA: Insufficient documentation

## 2012-09-17 DIAGNOSIS — M1710 Unilateral primary osteoarthritis, unspecified knee: Secondary | ICD-10-CM | POA: Insufficient documentation

## 2012-09-17 DIAGNOSIS — Z7982 Long term (current) use of aspirin: Secondary | ICD-10-CM | POA: Insufficient documentation

## 2012-09-17 DIAGNOSIS — Z79899 Other long term (current) drug therapy: Secondary | ICD-10-CM | POA: Insufficient documentation

## 2012-09-17 DIAGNOSIS — I10 Essential (primary) hypertension: Secondary | ICD-10-CM | POA: Diagnosis not present

## 2012-09-17 DIAGNOSIS — E785 Hyperlipidemia, unspecified: Secondary | ICD-10-CM | POA: Insufficient documentation

## 2012-09-17 DIAGNOSIS — E669 Obesity, unspecified: Secondary | ICD-10-CM | POA: Insufficient documentation

## 2012-09-17 DIAGNOSIS — IMO0002 Reserved for concepts with insufficient information to code with codable children: Secondary | ICD-10-CM | POA: Insufficient documentation

## 2012-09-17 DIAGNOSIS — Z87448 Personal history of other diseases of urinary system: Secondary | ICD-10-CM | POA: Insufficient documentation

## 2012-09-17 DIAGNOSIS — E11319 Type 2 diabetes mellitus with unspecified diabetic retinopathy without macular edema: Secondary | ICD-10-CM | POA: Insufficient documentation

## 2012-09-17 DIAGNOSIS — Z862 Personal history of diseases of the blood and blood-forming organs and certain disorders involving the immune mechanism: Secondary | ICD-10-CM | POA: Insufficient documentation

## 2012-09-17 LAB — CBC WITH DIFFERENTIAL/PLATELET
Basophils Absolute: 0 10*3/uL (ref 0.0–0.1)
Basophils Relative: 0 % (ref 0–1)
Eosinophils Absolute: 0.1 10*3/uL (ref 0.0–0.7)
Eosinophils Relative: 2 % (ref 0–5)
HCT: 26.2 % — ABNORMAL LOW (ref 36.0–46.0)
Hemoglobin: 8.8 g/dL — ABNORMAL LOW (ref 12.0–15.0)
Lymphocytes Relative: 14 % (ref 12–46)
Lymphs Abs: 0.9 10*3/uL (ref 0.7–4.0)
MCH: 29.6 pg (ref 26.0–34.0)
MCHC: 33.6 g/dL (ref 30.0–36.0)
MCV: 88.2 fL (ref 78.0–100.0)
Monocytes Absolute: 0.5 10*3/uL (ref 0.1–1.0)
Monocytes Relative: 8 % (ref 3–12)
Neutro Abs: 4.7 10*3/uL (ref 1.7–7.7)
Neutrophils Relative %: 77 % (ref 43–77)
Platelets: 274 10*3/uL (ref 150–400)
RBC: 2.97 MIL/uL — ABNORMAL LOW (ref 3.87–5.11)
RDW: 17.4 % — ABNORMAL HIGH (ref 11.5–15.5)
WBC: 6.2 10*3/uL (ref 4.0–10.5)

## 2012-09-17 LAB — COMPREHENSIVE METABOLIC PANEL
ALT: 33 U/L (ref 0–35)
AST: 28 U/L (ref 0–37)
Albumin: 3.4 g/dL — ABNORMAL LOW (ref 3.5–5.2)
Alkaline Phosphatase: 85 U/L (ref 39–117)
BUN: 12 mg/dL (ref 6–23)
CO2: 27 mEq/L (ref 19–32)
Calcium: 9.2 mg/dL (ref 8.4–10.5)
Chloride: 102 mEq/L (ref 96–112)
Creatinine, Ser: 1.6 mg/dL — ABNORMAL HIGH (ref 0.50–1.10)
GFR calc Af Amer: 38 mL/min — ABNORMAL LOW (ref >90)
GFR calc non Af Amer: 33 mL/min — ABNORMAL LOW (ref >90)
Glucose, Bld: 115 mg/dL — ABNORMAL HIGH (ref 70–99)
Potassium: 2.9 mEq/L — ABNORMAL LOW (ref 3.5–5.1)
Sodium: 141 mEq/L (ref 135–145)
Total Bilirubin: 0.5 mg/dL (ref 0.3–1.2)
Total Protein: 6.5 g/dL (ref 6.0–8.3)

## 2012-09-17 LAB — TROPONIN I: Troponin I: <0.3 ng/mL (ref <0.30)

## 2012-09-17 LAB — URINALYSIS, ROUTINE W REFLEX MICROSCOPIC
Bilirubin Urine: NEGATIVE
Glucose, UA: NEGATIVE mg/dL
Ketones, ur: NEGATIVE mg/dL
Nitrite: NEGATIVE
Protein, ur: 30 mg/dL — AB
Specific Gravity, Urine: 1.018 (ref 1.005–1.030)
Urobilinogen, UA: 1 mg/dL (ref 0.0–1.0)
pH: 6 (ref 5.0–8.0)

## 2012-09-17 LAB — POTASSIUM: Potassium: 3.4 mEq/L — ABNORMAL LOW (ref 3.5–5.1)

## 2012-09-17 LAB — URINE MICROSCOPIC-ADD ON

## 2012-09-17 MED ORDER — POTASSIUM CHLORIDE 10 MEQ/100ML IV SOLN
10.0000 meq | Freq: Once | INTRAVENOUS | Status: AC
  Administered 2012-09-17: 10 meq via INTRAVENOUS
  Filled 2012-09-17: qty 100

## 2012-09-17 MED ORDER — POTASSIUM CHLORIDE CRYS ER 20 MEQ PO TBCR
EXTENDED_RELEASE_TABLET | ORAL | Status: AC
  Filled 2012-09-17: qty 1

## 2012-09-17 MED ORDER — POTASSIUM CHLORIDE CRYS ER 20 MEQ PO TBCR
40.0000 meq | EXTENDED_RELEASE_TABLET | Freq: Once | ORAL | Status: AC
  Administered 2012-09-17: 40 meq via ORAL
  Filled 2012-09-17: qty 2

## 2012-09-17 MED ORDER — HYDRALAZINE HCL 10 MG PO TABS
10.0000 mg | ORAL_TABLET | Freq: Once | ORAL | Status: AC
  Administered 2012-09-17: 10 mg via ORAL
  Filled 2012-09-17: qty 1

## 2012-09-17 NOTE — ED Notes (Signed)
Report called to Archie Patten, Merchandiser, retail at [employer], follow up information given

## 2012-09-17 NOTE — ED Provider Notes (Signed)
History     CSN: [id number]  Arrival date & time 09/17/12  0113   First MD Initiated Contact with Patient 09/17/12 0115      Chief Complaint  Patient presents with  . Hypertension    (Consider location/radiation/quality/duration/timing/severity/associated sxs/prior treatment) HPI Pt is in rehab at [facility] following knee surgery and complications in April. States her SBP normally runs in 160's. Since surgery BP has been uncontrolled sometimes greater than 200. Pt was sent from NH and for elevated Bp and decreased responsiveness per staff. Pt states she feel at her baseline and denies pain, fever, nausea, vomiting, weakness, numbness, vision changes.  Past Medical History  Diagnosis Date  . Allergy   . Hypertension   . Hyperlipidemia   . Fatty liver disease, nonalcoholic   . History of hyperkalemia   . Obesity   . Renal lesion   . Hx antineoplastic chemo  10/12 - 06/24/11    PACLITAX EL COMPLETED 06/24/11  . Anemia   . Diabetes mellitus ORAL MED  . OA (osteoarthritis) of knee     RIGHT LEG  . Mild nonproliferative diabetic retinopathy of right eye 03/19/11    Dr. Norva Pavlov  . Breast CA     (Rt) breast ca dx 4/12---  S/P RADICAL MASTECTOMY AND CHEMORADIATION  . Numbness and tingling     Hx: of in fingers and toes since chemotherapy    Past Surgical History  Procedure Laterality Date  . Knee arthroscopy  10-28-1999    RIGHT  . Mastectomy modified radical  12-04-2010    W/ LEFT PAC PLACEMENT (RIGHT BREAST W/ AXILLARY CONTENTS/ NODE BX'S  . Transthoracic echocardiogram  10-29-2010    NORMAL LVF/ EF 55-60%/ MILD MV REGURG  . Vaginal hysterectomy  AGE 58    W/ LSO  . Tubal ligation  YRS AGO  . Laparoscopy with laparoscopic right salpingo oophorectomy and lysis of pelvic and abdominal adhesions  March 2013    Dr. Jennette Kettle   . Portacath placement      REPLACED [facility] DUE TO MALFUNCTION (LEFT)  . Port-a-cath removal  09/24/2011    Procedure: REMOVAL PORT-A-CATH;  Surgeon:  Clovis Pu. Cornett, MD;  Location: [facility];  Service: General;  Laterality: N/A;  . Colonoscopy w/ polypectomy      Hx; of  . Total knee arthroplasty Right 08/16/2012    Procedure: TOTAL KNEE ARTHROPLASTY;  Surgeon: [name], MD;  Location: [facility] OR;  Service: Orthopedics;  Laterality: Right;  . I&d knee with poly exchange Right 09/03/2012    Procedure: IRRIGATION AND DEBRIDEMENT RIGHT KNEE WITH POLY EXCHANGE;  Surgeon: [name], MD;  Location: [facility] OR;  Service: Orthopedics;  Laterality: Right;    Family History  Problem Relation Age of Onset  . Stroke Father   . Heart disease Sister   . Cancer Sister     breast  . Heart disease Brother   . Cancer Sister     COLON CANCER    History  Substance Use Topics  . Smoking status: Never Smoker   . Smokeless tobacco: Never Used  . Alcohol Use: No    OB History   Grav Para Term Preterm Abortions TAB SAB Ect Mult Living   4 4 4       4       Review of Systems  Constitutional: Negative for fever, chills and fatigue.  HENT: Negative for neck pain and neck stiffness.   Eyes: Negative for visual disturbance.  Respiratory: Negative  for cough, shortness of breath and wheezing.   Cardiovascular: Negative for chest pain, palpitations and leg swelling.  Gastrointestinal: Negative for nausea, vomiting and abdominal pain.  Genitourinary: Negative for dysuria, frequency and flank pain.  Musculoskeletal: Negative for myalgias and back pain.  Skin: Negative for rash and wound.  Neurological: Negative for dizziness, syncope, weakness, light-headedness, numbness and headaches.  All other systems reviewed and are negative.    Allergies  Metformin  Home Medications   Current Outpatient Rx  Name  Route  Sig  Dispense  Refill  . anastrozole (ARIMIDEX) 1 MG tablet   Oral   Take 1 mg by mouth at bedtime.         Marland Kitchen aspirin EC 325 MG tablet   Oral   Take 325 mg by mouth 2 (two) times daily. For 2 weeks until 09/22/12         .  hydrALAZINE (APRESOLINE) 10 MG tablet   Oral   Take 10 mg by mouth every 8 (eight) hours as needed. SBP greater than 160         . HYDROcodone-acetaminophen (NORCO/VICODIN) 5-325 MG per tablet   Oral   Take 1 tablet by mouth every 4 (four) hours as needed for pain.         Marland Kitchen HYDROmorphone (DILAUDID) 2 MG tablet   Oral   Take 2 mg by mouth every 8 (eight) hours.         Marland Kitchen HYDROmorphone (DILAUDID) 2 MG tablet   Oral   Take 4 mg by mouth every 4 (four) hours as needed for pain.         . metFORMIN (GLUCOPHAGE) 500 MG tablet   Oral   Take 500 mg by mouth 2 (two) times daily with a meal.         . methocarbamol (ROBAXIN) 500 MG tablet   Oral   Take 1 tablet (500 mg total) by mouth every 6 (six) hours as needed.   60 tablet   0   . metoprolol succinate (TOPROL-XL) 50 MG 24 hr tablet   Oral   Take 50 mg by mouth 2 (two) times daily. Take with or immediately following a meal.         . oxyCODONE-acetaminophen (PERCOCET/ROXICET) 5-325 MG per tablet   Oral   Take 1 tablet by mouth every 4 (four) hours as needed for pain.         . Polyvinyl Alcohol-Povidone (REFRESH OP)   Both Eyes   Place 1 drop into both eyes daily as needed. Dry eyes         . pravastatin (PRAVACHOL) 40 MG tablet   Oral   Take 1 tablet (40 mg total) by mouth at bedtime.   90 tablet   1   . promethazine (PHENERGAN) 25 MG tablet   Oral   Take 25 mg by mouth every 6 (six) hours as needed for nausea.         Marland Kitchen PROMETHAZINE HCL IM   Intramuscular   Inject 25 mg into the muscle every 6 (six) hours as needed.         . rifampin (RIFADIN) 300 MG capsule   Oral   Take 1 capsule (300 mg total) by mouth daily.   60 capsule   1   . vancomycin (VANCOCIN) 1 GM/200ML SOLN   Intravenous   Inject 200 mLs (1,000 mg total) into the vein every other day.   4000 mL   1   .  diphenhydrAMINE (BENADRYL) 25 mg capsule   Oral   Take 1 capsule (25 mg total) by mouth every 4 (four) hours as needed  for itching.   24 capsule   2     BP 189/81  Pulse 69  Temp(Src) 98.4 F (36.9 C) (Oral)  Resp 16  SpO2 100%  Physical Exam  Nursing note and vitals reviewed. Constitutional: [name], place, and time. She appears well-developed and well-nourished. No distress.  HENT:  Head: Normocephalic and atraumatic.  Mouth/Throat: Oropharynx is clear and moist.  Eyes: EOM are normal. Pupils are equal, round, and reactive to light.  Neck: Normal range of motion. Neck supple.  Cardiovascular: Normal rate and regular rhythm.   Pulmonary/Chest: Effort normal and breath sounds normal. No respiratory distress. She has no wheezes. She has no rales. She exhibits no tenderness.  Abdominal: Soft. Bowel sounds are normal. She exhibits no distension and no mass. There is no tenderness. There is no rebound and no guarding.  Musculoskeletal: Normal range of motion. She exhibits no edema and no tenderness.  Surgical wound to L knee without evidence of infection.   Neurological: She is alert and oriented to person, place, and time.  Moves all ext without deficit, sensation intact  Skin: Skin is warm and dry. No rash noted. No erythema.  Psychiatric: She has a normal mood and affect. Her behavior is normal.    ED Course  Procedures (including critical care time)  Labs Reviewed  CBC WITH DIFFERENTIAL - Abnormal; Notable for the following:    RBC 2.97 (*)    Hemoglobin 8.8 (*)    HCT 26.2 (*)    RDW 17.4 (*)    All other components within normal limits  COMPREHENSIVE METABOLIC PANEL - Abnormal; Notable for the following:    Potassium 2.9 (*)    Glucose, Bld 115 (*)    Creatinine, Ser 1.60 (*)    Albumin 3.4 (*)    GFR calc non Af Amer 33 (*)    GFR calc Af Amer 38 (*)    All other components within normal limits  URINALYSIS, ROUTINE W REFLEX MICROSCOPIC - Abnormal; Notable for the following:    Color, Urine AMBER (*)    APPearance CLOUDY (*)    Hgb urine dipstick TRACE (*)     Protein, ur 30 (*)    Leukocytes, UA SMALL (*)    All other components within normal limits  URINE MICROSCOPIC-ADD ON - Abnormal; Notable for the following:    Squamous Epithelial / LPF FEW (*)    All other components within normal limits  POTASSIUM - Abnormal; Notable for the following:    Potassium 3.4 (*)    All other components within normal limits  URINE CULTURE  TROPONIN I   No results found.   1. Hypertension   2. Hypokalemia      Date: 09/17/2012  Rate: 75  Rhythm: normal sinus rhythm  QRS Axis: normal  Intervals: normal  ST/T Wave abnormalities: nonspecific T wave changes  Conduction Disutrbances:none  Narrative Interpretation:   Old EKG Reviewed: unchanged    MDM   Pt remains at baseline mental status. K corrected. Adivsed f/u with PMD for BP management.         Loren Racer, MD 09/17/12 [phone]

## 2012-09-17 NOTE — ED Notes (Addendum)
Patient is from [facility], EMS called out due to staff reporting patient having decreased LOC, upon arrival patient is alert and oriented X4 and reports HTN 190/86, P-80, R-16 BS-112 reprots that BP has been elevated for the past month, medication was changed 2 wks ago but it is not working.  Patient also has loss of appetite since finishing chemo treatments last year

## 2012-09-18 LAB — URINE CULTURE: Colony Count: 2000

## 2012-09-22 ENCOUNTER — Encounter: Payer: Self-pay | Admitting: Internal Medicine

## 2012-09-22 ENCOUNTER — Ambulatory Visit (INDEPENDENT_AMBULATORY_CARE_PROVIDER_SITE_OTHER): Payer: Medicare Other | Admitting: Internal Medicine

## 2012-09-22 VITALS — BP 185/117 | HR 76 | Temp 98.1°F | Ht 59.0 in | Wt 151.0 lb

## 2012-09-22 DIAGNOSIS — T8450XS Infection and inflammatory reaction due to unspecified internal joint prosthesis, sequela: Secondary | ICD-10-CM

## 2012-09-22 DIAGNOSIS — A4902 Methicillin resistant Staphylococcus aureus infection, unspecified site: Secondary | ICD-10-CM

## 2012-09-22 DIAGNOSIS — Z5181 Encounter for therapeutic drug level monitoring: Secondary | ICD-10-CM | POA: Diagnosis not present

## 2012-09-22 DIAGNOSIS — I10 Essential (primary) hypertension: Secondary | ICD-10-CM

## 2012-09-22 DIAGNOSIS — T889XXS Complication of surgical and medical care, unspecified, sequela: Secondary | ICD-10-CM | POA: Diagnosis not present

## 2012-09-22 LAB — COMPREHENSIVE METABOLIC PANEL WITH GFR
ALT: 17 U/L (ref 0–35)
AST: 17 U/L (ref 0–37)
Albumin: 3.8 g/dL (ref 3.5–5.2)
Alkaline Phosphatase: 60 U/L (ref 39–117)
BUN: 13 mg/dL (ref 6–23)
CO2: 24 meq/L (ref 19–32)
Calcium: 9 mg/dL (ref 8.4–10.5)
Chloride: 106 meq/L (ref 96–112)
Creat: 1.41 mg/dL — ABNORMAL HIGH (ref 0.50–1.10)
Glucose, Bld: 117 mg/dL — ABNORMAL HIGH (ref 70–99)
Potassium: 3.4 meq/L — ABNORMAL LOW (ref 3.5–5.3)
Sodium: 142 meq/L (ref 135–145)
Total Bilirubin: 0.5 mg/dL (ref 0.3–1.2)
Total Protein: 6.7 g/dL (ref 6.0–8.3)

## 2012-09-22 NOTE — Progress Notes (Signed)
[facility] CLINIC NOTE  RFV: hospital follow up for PJI Subjective:    Patient ID: [name], female    DOB: [birth year], 67 y.o.   MRN: [id number]  HPI 67yo F with HTN, DM, GERD, CKD, breast ca 2012 now on arimidex who was hospitalized for MRSA PJI who underwent 5/2 I x D and polyexchange. Currently on IV vancomycin and rifampin 300mg  daily.   having difficulty with managing BP at her nursing home. Has occ headaches. Has poor appetite due to cancer medication. Day 19 of 42 of abtx. Labs on 5/16 reveal Hypokalemia, cr. 1.39 but k of 3.2 and vanco trough 8.4. No knee pain, fever, chills, nightsweats, no troubles with picc line  Current Outpatient Prescriptions on File Prior to Visit  Medication Sig Dispense Refill  . anastrozole (ARIMIDEX) 1 MG tablet Take 1 mg by mouth at bedtime.      Marland Kitchen aspirin EC 325 MG tablet Take 325 mg by mouth 2 (two) times daily. For 2 weeks until 09/22/12      . diphenhydrAMINE (BENADRYL) 25 mg capsule Take 1 capsule (25 mg total) by mouth every 4 (four) hours as needed for itching.  24 capsule  2  . hydrALAZINE (APRESOLINE) 10 MG tablet Take 10 mg by mouth every 8 (eight) hours as needed. SBP greater than 160      . metFORMIN (GLUCOPHAGE) 500 MG tablet Take 500 mg by mouth 2 (two) times daily with a meal.      . metoprolol succinate (TOPROL-XL) 50 MG 24 hr tablet Take 50 mg by mouth 2 (two) times daily. Take with or immediately following a meal.      . pravastatin (PRAVACHOL) 40 MG tablet Take 1 tablet (40 mg total) by mouth at bedtime.  90 tablet  1  . rifampin (RIFADIN) 300 MG capsule Take 1 capsule (300 mg total) by mouth daily.  60 capsule  1  . vancomycin (VANCOCIN) 1 GM/200ML SOLN Inject 200 mLs (1,000 mg total) into the vein every other day.  4000 mL  1  . HYDROcodone-acetaminophen (NORCO/VICODIN) 5-325 MG per tablet Take 1 tablet by mouth every 4 (four) hours as needed for pain.      Marland Kitchen HYDROmorphone (DILAUDID) 2 MG tablet Take 2 mg by mouth every 8 (eight) hours.       Marland Kitchen HYDROmorphone (DILAUDID) 2 MG tablet Take 4 mg by mouth every 4 (four) hours as needed for pain.      . methocarbamol (ROBAXIN) 500 MG tablet Take 1 tablet (500 mg total) by mouth every 6 (six) hours as needed.  60 tablet  0  . oxyCODONE-acetaminophen (PERCOCET/ROXICET) 5-325 MG per tablet Take 1 tablet by mouth every 4 (four) hours as needed for pain.      . Polyvinyl Alcohol-Povidone (REFRESH OP) Place 1 drop into both eyes daily as needed. Dry eyes      . promethazine (PHENERGAN) 25 MG tablet Take 25 mg by mouth every 6 (six) hours as needed for nausea.      Melene Muller ON 04/19/2013] PROMETHAZINE HCL IM Inject 25 mg into the muscle every 6 (six) hours as needed.       No current facility-administered medications on file prior to visit.   Active Ambulatory Problems    Diagnosis Date Noted  . DIABETES MELLITUS, TYPE II 03/10/2006  . HYPERLIPIDEMIA 03/10/2006  . OBESITY NOS 07/09/2006  . ANEMIA-NOS 03/10/2006  . Hypertension associated with diabetes 03/10/2006  . ALLERGIC RHINITIS 03/10/2006  . FATTY LIVER  DISEASE 03/10/2006  . OSTEOARTHRITIS 03/25/2007  . BACK PAIN, LUMBAR 10/04/2008  . COLONIC POLYPS, HX OF 07/19/2007  . Breast cancer metastasized to axillary lymph node 09/25/2010  . Cancer of central portion of female breast 06/25/2011  . Contact dermatitis due to soap 12/18/2011  . Chest pain at rest, assoc. with nausea, racing heart,  03/11/2012  . New onset of headaches after age 67 03/16/2012  . Arthritis of knee, right 08/16/2012  . Infection of prosthetic knee joint 09/03/2012   Resolved Ambulatory Problems    Diagnosis Date Noted  . Axillary mass 09/20/2010  . Seasonal allergies 01/20/2011  . Acute bacterial sinusitis 10/01/2011  . Cough 10/01/2011   Past Medical History  Diagnosis Date  . Allergy   . Hypertension   . Hyperlipidemia   . Fatty liver disease, nonalcoholic   . History of hyperkalemia   . Obesity   . Renal lesion   . Hx antineoplastic chemo   10/12 - 06/24/11  . Anemia   . Diabetes mellitus ORAL MED  . OA (osteoarthritis) of knee   . Mild nonproliferative diabetic retinopathy of right eye 03/19/11  . Breast CA   . Numbness and tingling      Review of Systems  Constitutional: Negative for fever, chills, diaphoresis, activity change, appetite change, fatigue and unexpected weight change.  HENT: Negative for congestion, sore throat, rhinorrhea, sneezing, trouble swallowing and sinus pressure.  Eyes: Negative for photophobia and visual disturbance.  Respiratory: Negative for cough, chest tightness, shortness of breath, wheezing and stridor.  Cardiovascular: Negative for chest pain, palpitations and leg swelling.  Gastrointestinal: Negative for nausea, vomiting, abdominal pain, diarrhea, constipation, blood in stool, abdominal distention and anal bleeding.  Genitourinary: Negative for dysuria, hematuria, flank pain and difficulty urinating.  Musculoskeletal: Negative for myalgias, back pain, joint swelling, arthralgias and gait problem.  Skin: Negative for color change, pallor, rash and wound.  Neurological: Negative for dizziness, tremors, weakness and light-headedness.  Hematological: Negative for adenopathy. Does not bruise/bleed easily.  Psychiatric/Behavioral: Negative for behavioral problems, confusion, sleep disturbance, dysphoric mood, decreased concentration and agitation.   Social hx: living at nursing home presently. No drink or smoking History  Substance Use Topics  . Smoking status: Never Smoker   . Smokeless tobacco: Never Used  . Alcohol Use: No    family history includes Cancer in her sisters; Heart disease in her brother and sister; and Stroke in her father.    Objective:   Physical Exam  BP 185/117  Pulse 76  Temp(Src) 98.1 F (36.7 C) (Oral)  Ht 4\' 11"  (1.499 m)  Wt 151 lb (68.493 kg)  BMI 30.48 kg/m2 Physical Exam  Constitutional:  oriented to person, place, and time.  appears well-developed and  well-nourished. No distress.  HENT:  Mouth/Throat: Oropharynx is clear and moist. No oropharyngeal exudate.  Cardiovascular: Normal rate, regular rhythm and normal heart sounds. Exam reveals no gallop and no friction rub.  No murmur heard.  Pulmonary/Chest: Effort normal and breath sounds normal. No respiratory distress. He has no wheezes.  Abdominal: Soft. Bowel sounds are normal. exhibits no distension. There is no tenderness.  Lymphadenopathy:  no cervical adenopathy.  Chest : right masectomy. Ext: left arm picc line c/d/i dressing no erythema or tenderness Neurological: He is alert and oriented to person, place, and time.  Skin: Skin is warm and dry. No rash noted. No erythema.  Psychiatric:  normal mood and affect.  behavior is normal.    BMET    Component  Value Date/Time   NA 141 09/17/2012 0156   NA 141 05/27/2012 0947   K 3.4* 09/17/2012 0620   K 3.6 05/27/2012 0947   CL 102 09/17/2012 0156   CL 100 05/27/2012 0947   CO2 27 09/17/2012 0156   CO2 30* 05/27/2012 0947   GLUCOSE 115* 09/17/2012 0156   GLUCOSE 120* 05/27/2012 0947   BUN 12 09/17/2012 0156   BUN 15.0 05/27/2012 0947   CREATININE 1.60* 09/17/2012 0156   CREATININE 0.8 05/27/2012 0947   CALCIUM 9.2 09/17/2012 0156   CALCIUM 10.1 05/27/2012 0947   GFRNONAA 33* 09/17/2012 0156   GFRAA 38* 09/17/2012 0156   5/2 micro: MRSA S-tetra S- bactrim S-rif S- vanco      Assessment & Plan:  MRSA PJI = continue with vancomycin but will need to ensure dose is adjust for trough of 15-20. Increase rifampin dose to 300mg  BID instead of daily. She is on day 19/42. End date for IV antibiotics will be 10/15/12. Take medications with meds in order to minimize nausea  Therapeutic drug monitoring = will check random vanco level today and will check AST to ensure no transaminitis from rifampin.  Recommended that the facility check vanco trough on 5/22 or 5/23 to ensure it is at goal  Hypokalemia = will check cmp  Hypertension, poorly controlled  = will recommend that they add scheduled hydralazine 10mg  BID in addition to her current regimen. May need further adjustment per facility doctor.

## 2012-09-23 LAB — VANCOMYCIN, RANDOM: Vancomycin Rm: 19.8 ug/mL

## 2012-10-05 NOTE — Progress Notes (Signed)
Patient ID: [name], female   DOB: 1945/07/04, 67 y.o.   MRN: [id number]        HISTORY & PHYSICAL  DATE: 09/14/2012   FACILITY: [facility]  LEVEL OF CARE: SNF (31)  ALLERGIES:  Allergies  Allergen Reactions  . Metformin Other (See Comments)    REACTION, diarrhea: 850 mg - diarrhea but tolerates 500 mg daily    CHIEF COMPLAINT:  Manage right knee prosthetic joint infection, diabetes mellitus, and hypertension.    HISTORY OF PRESENT ILLNESS:  The patient is a 67 year-old, African-American female.    RIGHT KNEE JOINT INFECTION:  Patient was having infection of prosthetic right knee joint.  Therefore, she underwent irrigation and debridement with polyexchange and tolerated the procedure well.  She is admitted to this facility for short-term rehabilitation.  She denies knee pain.   DM:pt's DM remains stable.  Pt denies polyuria, polydipsia, polyphagia, changes in vision or hypoglycemic episodes.  No complications noted from the medication presently being used.  Last hemoglobin A1c is: A recent  hemoglobin A1C is not available.   HTN: Pt 's HTN remains stable.  Denies CP, sob, DOE, pedal edema, headaches, dizziness or visual disturbances.  No complications from the medications currently being used.  Last BP : 178/83, 142/74, 162/68.  PAST MEDICAL HISTORY :  Past Medical History  Diagnosis Date  . Allergy   . Hypertension   . Hyperlipidemia   . Fatty liver disease, nonalcoholic   . History of hyperkalemia   . Obesity   . Renal lesion   . Hx antineoplastic chemo  10/12 - 06/24/11    PACLITAX EL COMPLETED 06/24/11  . Anemia   . Diabetes mellitus ORAL MED  . OA (osteoarthritis) of knee     RIGHT LEG  . Mild nonproliferative diabetic retinopathy of right eye 03/19/11    Dr. Norva Pavlov  . Breast CA     (Rt) breast ca dx 4/12---  S/P RADICAL MASTECTOMY AND CHEMORADIATION  . Numbness and tingling     Hx: of in fingers and toes since chemotherapy     PAST SURGICAL HISTORY: Past Surgical History  Procedure Laterality Date  . Knee arthroscopy  10-28-1999    RIGHT  . Mastectomy modified radical  12-04-2010    W/ LEFT PAC PLACEMENT (RIGHT BREAST W/ AXILLARY CONTENTS/ NODE BX'S  . Transthoracic echocardiogram  10-29-2010    NORMAL LVF/ EF 55-60%/ MILD MV REGURG  . Vaginal hysterectomy  AGE 89    W/ LSO  . Tubal ligation  YRS AGO  . Laparoscopy with laparoscopic right salpingo oophorectomy and lysis of pelvic and abdominal adhesions  March 2013    Dr. Jennette Kettle   . Portacath placement      REPLACED [facility] DUE TO MALFUNCTION (LEFT)  . Port-a-cath removal  09/24/2011    Procedure: REMOVAL PORT-A-CATH;  Surgeon: Clovis Pu. Cornett, MD;  Location: [facility] ORS;  Service: General;  Laterality: N/A;  . Colonoscopy w/ polypectomy      Hx; of  . Total knee arthroplasty Right 08/16/2012    Procedure: TOTAL KNEE ARTHROPLASTY;  Surgeon: [name], MD;  Location: [facility] OR;  Service: Orthopedics;  Laterality: Right;  . I&d knee with poly exchange Right 09/03/2012    Procedure: IRRIGATION AND DEBRIDEMENT RIGHT KNEE WITH POLY EXCHANGE;  Surgeon: [name], MD;  Location: [facility] OR;  Service: Orthopedics;  Laterality: Right;    SOCIAL HISTORY:  reports that she has never smoked. She  has never used smokeless tobacco. She reports that she does not drink alcohol or use illicit drugs.  FAMILY HISTORY:  Family History  Problem Relation Age of Onset  . Stroke Father   . Heart disease Sister   . Cancer Sister     breast  . Heart disease Brother   . Cancer Sister     COLON CANCER    CURRENT MEDICATIONS: Reviewed per [facility]  REVIEW OF SYSTEMS:  See HPI otherwise 14 point ROS is negative.  PHYSICAL EXAMINATION  VS:  T 97.6        P 82       RR 20     BP 178/83       POX 98% room air        WT (Lb)  GENERAL: no acute distress, moderately obese body habitus SKIN: warm & dry, no suspicious lesions or rashes, no excessive dryness EYES: conjunctivae normal,  sclerae normal, normal eye lids MOUTH/THROAT: lips without lesions,no lesions in the mouth,tongue is without lesions,uvula elevates in midline NECK: supple, trachea midline, no neck masses, no thyroid tenderness, no thyromegaly LYMPHATICS: no LAN in the neck, no supraclavicular LAN RESPIRATORY: breathing is even & unlabored, BS CTAB CARDIAC: RRR, no murmur,no extra heart sounds EDEMA/VARICOSITIES: right lower extremity has +2 edema ARTERIAL:  pedal pulses nonpalpable  GI:  ABDOMEN: abdomen soft, normal BS, no masses, no tenderness  LIVER/SPLEEN: no hepatomegaly, no splenomegaly MUSCULOSKELETAL: HEAD: normal to inspection & palpation BACK: no kyphosis, scoliosis or spinal processes tenderness EXTREMITIES: LEFT UPPER EXTREMITY: full range of motion, normal strength & tone RIGHT UPPER EXTREMITY:  full range of motion, normal strength & tone LEFT LOWER EXTREMITY: strength intact, range of motion moderate  RIGHT LOWER EXTREMITY: strength intact, range of motion not tested due to surgery  PSYCHIATRIC: the patient is alert & oriented to person, affect & behavior appropriate  LABS/RADIOLOGY: Creatinine 2.03, otherwise BMP normal.   Hemoglobin 7.5, otherwise CBC normal.   Chest x-ray:  No acute disease.   Renal ultrasound:  No acute findings.  ASSESSMENT/PLAN:  Prosthetic right knee joint infection.  Status post irrigation and debridement.  Continue antibiotics as prescribed.    Hypertension.  Uncontrolled problem.  Clonidine 0.1 mg b.i.d. started.  Diabetes mellitus.  Continue current medications.    Acute blood loss anemia.  Reassess hemoglobin level.   Check CBC and BMP.   I have reviewed patient's medical records received at admission/from hospitalization.  CPT CODE: 40981

## 2012-10-14 ENCOUNTER — Ambulatory Visit: Payer: Medicare Other | Admitting: Internal Medicine

## 2012-10-14 ENCOUNTER — Telehealth: Payer: Self-pay | Admitting: *Deleted

## 2012-10-14 ENCOUNTER — Encounter: Payer: Self-pay | Admitting: Internal Medicine

## 2012-10-14 ENCOUNTER — Ambulatory Visit (INDEPENDENT_AMBULATORY_CARE_PROVIDER_SITE_OTHER): Payer: Medicare Other | Admitting: Internal Medicine

## 2012-10-14 VITALS — BP 165/102 | HR 79 | Temp 98.2°F | Ht 59.0 in | Wt 140.0 lb

## 2012-10-14 DIAGNOSIS — Z5189 Encounter for other specified aftercare: Secondary | ICD-10-CM

## 2012-10-14 DIAGNOSIS — Z96659 Presence of unspecified artificial knee joint: Secondary | ICD-10-CM

## 2012-10-14 DIAGNOSIS — T8459XD Infection and inflammatory reaction due to other internal joint prosthesis, subsequent encounter: Secondary | ICD-10-CM

## 2012-10-14 NOTE — Telephone Encounter (Signed)
Facility where the patient is staying is having transportation issues and cannot get her here this morning but they asked if she could be switched to the afternoon as they anticipate having the problem corrected by lunch. [name] scheduled the patient with Dr Luciana Axe for this afternoon.

## 2012-10-15 MED ORDER — DOXYCYCLINE HYCLATE 100 MG PO TABS: 100.0000 mg | ORAL_TABLET | Freq: Two times a day (BID) | ORAL | Status: DC

## 2012-10-15 NOTE — Progress Notes (Signed)
  Subjective:    Patient ID: [name], female    DOB: Oct 28, 1945, 67 y.o.   MRN: [id number]  HPI HPI 67yo F with HTN, DM, GERD, CKD, breast ca 2012 now on arimidex who was hospitalized for MRSA PJI who underwent 5/2 I x D and polyexchange. Currently on IV vancomycin and rifampin 300mg  bid.  Her vancomycin trough levels have been adequate, she though has had difficulty taking rifampin because she has had a poor appetite due to to her previous chemotherapy. She was told by the facility that she is unable to take her medication without food and so has essentially been off of rifampin. She is having no problems with her knee and is not significantly swollen, warm or have any significant problems. She is scheduled to finish 6 weeks on June 13. No fever or chills.    Review of Systems  Constitutional: Negative for fever and fatigue.  Cardiovascular: Negative for leg swelling.  Gastrointestinal: Negative for nausea, vomiting, diarrhea and abdominal distention.  Musculoskeletal: Positive for arthralgias. Negative for joint swelling.  Skin: Negative for rash.  Neurological: Negative for dizziness and headaches.       Objective:   Physical Exam  Constitutional: She appears well-developed and well-nourished. No distress.  Eyes: No scleral icterus.  Cardiovascular: Normal rate, regular rhythm and normal heart sounds.   No murmur heard. Pulmonary/Chest: Effort normal and breath sounds normal. No respiratory distress.  Musculoskeletal: She exhibits no edema.  Lymphadenopathy:    She has no cervical adenopathy.  Skin: Skin is warm and dry. No rash noted.          Assessment & Plan:

## 2012-10-15 NOTE — Assessment & Plan Note (Signed)
She is doing well with her therapy and now has about completed 6 weeks of IV medication. She did not use rifampin due to difficulty with p.o. Intake. At this time, she is not having any significant problems and therefore I will have her complete her vancomycin and have the facility remove her PICC line. Will then start her on doxycycline twice a day for 3-6 months. I will hold on her taking rifampin since she is having so much problems with oral intake and medications and just stick with the doxycycline. Her MRSA is sensitive. She will return in about 2 months to assure she is doing well. She was reminded of photosensitivity call for any problems

## 2012-10-17 DIAGNOSIS — T8140XA Infection following a procedure, unspecified, initial encounter: Secondary | ICD-10-CM | POA: Diagnosis not present

## 2012-10-17 DIAGNOSIS — I10 Essential (primary) hypertension: Secondary | ICD-10-CM | POA: Diagnosis not present

## 2012-10-17 DIAGNOSIS — E119 Type 2 diabetes mellitus without complications: Secondary | ICD-10-CM | POA: Diagnosis not present

## 2012-10-17 DIAGNOSIS — Z853 Personal history of malignant neoplasm of breast: Secondary | ICD-10-CM | POA: Diagnosis not present

## 2012-10-17 DIAGNOSIS — R269 Unspecified abnormalities of gait and mobility: Secondary | ICD-10-CM | POA: Diagnosis not present

## 2012-10-17 DIAGNOSIS — Z96659 Presence of unspecified artificial knee joint: Secondary | ICD-10-CM | POA: Diagnosis not present

## 2012-10-17 DIAGNOSIS — T8450XA Infection and inflammatory reaction due to unspecified internal joint prosthesis, initial encounter: Secondary | ICD-10-CM | POA: Diagnosis not present

## 2012-10-19 DIAGNOSIS — Z853 Personal history of malignant neoplasm of breast: Secondary | ICD-10-CM | POA: Diagnosis not present

## 2012-10-19 DIAGNOSIS — E119 Type 2 diabetes mellitus without complications: Secondary | ICD-10-CM | POA: Diagnosis not present

## 2012-10-19 DIAGNOSIS — I10 Essential (primary) hypertension: Secondary | ICD-10-CM | POA: Diagnosis not present

## 2012-10-19 DIAGNOSIS — R269 Unspecified abnormalities of gait and mobility: Secondary | ICD-10-CM | POA: Diagnosis not present

## 2012-10-19 DIAGNOSIS — Z96659 Presence of unspecified artificial knee joint: Secondary | ICD-10-CM | POA: Diagnosis not present

## 2012-10-19 DIAGNOSIS — T8140XA Infection following a procedure, unspecified, initial encounter: Secondary | ICD-10-CM | POA: Diagnosis not present

## 2012-10-20 ENCOUNTER — Other Ambulatory Visit: Payer: Self-pay | Admitting: Oncology

## 2012-10-20 DIAGNOSIS — T8140XA Infection following a procedure, unspecified, initial encounter: Secondary | ICD-10-CM | POA: Diagnosis not present

## 2012-10-20 DIAGNOSIS — Z96659 Presence of unspecified artificial knee joint: Secondary | ICD-10-CM | POA: Diagnosis not present

## 2012-10-20 DIAGNOSIS — C773 Secondary and unspecified malignant neoplasm of axilla and upper limb lymph nodes: Secondary | ICD-10-CM

## 2012-10-20 DIAGNOSIS — E119 Type 2 diabetes mellitus without complications: Secondary | ICD-10-CM | POA: Diagnosis not present

## 2012-10-20 DIAGNOSIS — Z853 Personal history of malignant neoplasm of breast: Secondary | ICD-10-CM | POA: Diagnosis not present

## 2012-10-20 DIAGNOSIS — C50919 Malignant neoplasm of unspecified site of unspecified female breast: Secondary | ICD-10-CM

## 2012-10-20 DIAGNOSIS — R269 Unspecified abnormalities of gait and mobility: Secondary | ICD-10-CM | POA: Diagnosis not present

## 2012-10-20 DIAGNOSIS — I10 Essential (primary) hypertension: Secondary | ICD-10-CM | POA: Diagnosis not present

## 2012-10-21 DIAGNOSIS — I10 Essential (primary) hypertension: Secondary | ICD-10-CM | POA: Diagnosis not present

## 2012-10-21 DIAGNOSIS — T8140XA Infection following a procedure, unspecified, initial encounter: Secondary | ICD-10-CM | POA: Diagnosis not present

## 2012-10-21 DIAGNOSIS — Z853 Personal history of malignant neoplasm of breast: Secondary | ICD-10-CM | POA: Diagnosis not present

## 2012-10-21 DIAGNOSIS — Z96659 Presence of unspecified artificial knee joint: Secondary | ICD-10-CM | POA: Diagnosis not present

## 2012-10-21 DIAGNOSIS — R269 Unspecified abnormalities of gait and mobility: Secondary | ICD-10-CM | POA: Diagnosis not present

## 2012-10-21 DIAGNOSIS — E119 Type 2 diabetes mellitus without complications: Secondary | ICD-10-CM | POA: Diagnosis not present

## 2012-10-25 DIAGNOSIS — Z853 Personal history of malignant neoplasm of breast: Secondary | ICD-10-CM | POA: Diagnosis not present

## 2012-10-25 DIAGNOSIS — I10 Essential (primary) hypertension: Secondary | ICD-10-CM | POA: Diagnosis not present

## 2012-10-25 DIAGNOSIS — Z96659 Presence of unspecified artificial knee joint: Secondary | ICD-10-CM | POA: Diagnosis not present

## 2012-10-25 DIAGNOSIS — T8140XA Infection following a procedure, unspecified, initial encounter: Secondary | ICD-10-CM | POA: Diagnosis not present

## 2012-10-25 DIAGNOSIS — R269 Unspecified abnormalities of gait and mobility: Secondary | ICD-10-CM | POA: Diagnosis not present

## 2012-10-25 DIAGNOSIS — E119 Type 2 diabetes mellitus without complications: Secondary | ICD-10-CM | POA: Diagnosis not present

## 2012-10-26 DIAGNOSIS — R269 Unspecified abnormalities of gait and mobility: Secondary | ICD-10-CM | POA: Diagnosis not present

## 2012-10-26 DIAGNOSIS — E119 Type 2 diabetes mellitus without complications: Secondary | ICD-10-CM | POA: Diagnosis not present

## 2012-10-26 DIAGNOSIS — Z96659 Presence of unspecified artificial knee joint: Secondary | ICD-10-CM | POA: Diagnosis not present

## 2012-10-26 DIAGNOSIS — I10 Essential (primary) hypertension: Secondary | ICD-10-CM | POA: Diagnosis not present

## 2012-10-26 DIAGNOSIS — Z853 Personal history of malignant neoplasm of breast: Secondary | ICD-10-CM | POA: Diagnosis not present

## 2012-10-26 DIAGNOSIS — T8140XA Infection following a procedure, unspecified, initial encounter: Secondary | ICD-10-CM | POA: Diagnosis not present

## 2012-10-27 ENCOUNTER — Encounter (INDEPENDENT_AMBULATORY_CARE_PROVIDER_SITE_OTHER): Payer: Self-pay | Admitting: Surgery

## 2012-10-27 DIAGNOSIS — T8140XA Infection following a procedure, unspecified, initial encounter: Secondary | ICD-10-CM | POA: Diagnosis not present

## 2012-10-27 DIAGNOSIS — Z853 Personal history of malignant neoplasm of breast: Secondary | ICD-10-CM | POA: Diagnosis not present

## 2012-10-27 DIAGNOSIS — I10 Essential (primary) hypertension: Secondary | ICD-10-CM | POA: Diagnosis not present

## 2012-10-27 DIAGNOSIS — Z96659 Presence of unspecified artificial knee joint: Secondary | ICD-10-CM | POA: Diagnosis not present

## 2012-10-27 DIAGNOSIS — R269 Unspecified abnormalities of gait and mobility: Secondary | ICD-10-CM | POA: Diagnosis not present

## 2012-10-27 DIAGNOSIS — E119 Type 2 diabetes mellitus without complications: Secondary | ICD-10-CM | POA: Diagnosis not present

## 2012-11-09 DIAGNOSIS — M25569 Pain in unspecified knee: Secondary | ICD-10-CM | POA: Diagnosis not present

## 2012-11-09 DIAGNOSIS — Z96659 Presence of unspecified artificial knee joint: Secondary | ICD-10-CM | POA: Diagnosis not present

## 2012-11-11 ENCOUNTER — Other Ambulatory Visit: Payer: Self-pay

## 2012-11-11 DIAGNOSIS — Z96659 Presence of unspecified artificial knee joint: Secondary | ICD-10-CM | POA: Diagnosis not present

## 2012-11-11 DIAGNOSIS — M25569 Pain in unspecified knee: Secondary | ICD-10-CM | POA: Diagnosis not present

## 2012-11-17 ENCOUNTER — Ambulatory Visit (INDEPENDENT_AMBULATORY_CARE_PROVIDER_SITE_OTHER): Payer: Medicare Other | Admitting: Internal Medicine

## 2012-11-17 ENCOUNTER — Encounter: Payer: Self-pay | Admitting: Internal Medicine

## 2012-11-17 VITALS — BP 137/77 | HR 81 | Temp 97.1°F | Wt 144.6 lb

## 2012-11-17 DIAGNOSIS — E1169 Type 2 diabetes mellitus with other specified complication: Secondary | ICD-10-CM | POA: Diagnosis not present

## 2012-11-17 DIAGNOSIS — Z23 Encounter for immunization: Secondary | ICD-10-CM

## 2012-11-17 DIAGNOSIS — D649 Anemia, unspecified: Secondary | ICD-10-CM

## 2012-11-17 DIAGNOSIS — I10 Essential (primary) hypertension: Secondary | ICD-10-CM

## 2012-11-17 DIAGNOSIS — E785 Hyperlipidemia, unspecified: Secondary | ICD-10-CM | POA: Diagnosis not present

## 2012-11-17 DIAGNOSIS — I152 Hypertension secondary to endocrine disorders: Secondary | ICD-10-CM

## 2012-11-17 DIAGNOSIS — E119 Type 2 diabetes mellitus without complications: Secondary | ICD-10-CM | POA: Diagnosis not present

## 2012-11-17 DIAGNOSIS — Z0000 Encounter for general adult medical examination without abnormal findings: Secondary | ICD-10-CM | POA: Insufficient documentation

## 2012-11-17 DIAGNOSIS — E1159 Type 2 diabetes mellitus with other circulatory complications: Secondary | ICD-10-CM

## 2012-11-17 LAB — POCT GLYCOSYLATED HEMOGLOBIN (HGB A1C): Hemoglobin A1C: 5.8

## 2012-11-17 LAB — GLUCOSE, CAPILLARY: Glucose-Capillary: 80 mg/dL (ref 70–99)

## 2012-11-17 MED ORDER — METFORMIN HCL 500 MG PO TABS: 500.0000 mg | ORAL_TABLET | Freq: Every day | ORAL | Status: DC

## 2012-11-17 NOTE — Assessment & Plan Note (Signed)
Patient had acute blood loss anemia during hospitalization. She had colonoscopy in 2011 negative for malignancy; she did have one small sessile right colon polyp and it was suggested that she repeat colonoscopy in 5 years, that being 2016.  Today she denies obvious signs or symptoms of blood loss.  She has been chronically anemic around 11 thought to be due to breast cancer and chemotherapy treatment. We will continue to monitor intermittently

## 2012-11-17 NOTE — Assessment & Plan Note (Addendum)
BP Readings from Last 3 Encounters:  11/17/12 137/77  10/14/12 165/102  09/22/12 185/117    Lab Results  Component Value Date   NA 142 09/22/2012   K 3.4* 09/22/2012   CREATININE 1.41* 09/22/2012    Assessment: Blood pressure control: controlled Progress toward BP goal:  at goal  Plan: Medications:  continue current medications - Toprol 50 mg daily; she was on lisinopril and hydrochlorothiazide in the past, I suspect that this was discontinued during her hospitalization with acute renal failure; we will check renal function today. Hopefully her renal function will return to baseline and we will be able to restart lisinopril in lieu of her diabetes   ADDENDUM: Last BMET with K 3.3.  Since then, Lisinopril has been restarted.  Check again at next follow up.

## 2012-11-17 NOTE — Assessment & Plan Note (Addendum)
BP Readings from Last 3 Encounters:  11/17/12 137/77  10/14/12 165/102  09/22/12 185/117    Lab Results  Component Value Date   NA 142 09/22/2012   K 3.4* 09/22/2012   CREATININE 1.41* 09/22/2012    Assessment: Blood pressure control: controlled Progress toward BP goal:  at goal  Plan: Medications:  Discontinue metformin given renal function, and A1c is well controlled and the patient with poor appetite; she has been on glipizide in the past, I'm not sure when this medication fell off, it was noted to still be on board and August 2013 Recheck A1c in 3 months, and re-initiate appropriate therapy as indicated

## 2012-11-17 NOTE — Patient Instructions (Signed)
General Instructions: -Your diabetes is under excellent control.  We are going discontinue metformin for now, and re-consider restarting it if needed in 3 months.  -Your blood pressure is under great control as well.  -Today you got your pneumonia shot, so you will me done with this particular shot forever.  You will still need annual flu shots.    Please be sure to bring all of your medications with you to every visit.  Should you have any new or worsening symptoms, please be sure to call the clinic at [phone].   Treatment Goals:  Goals (1 Years of Data) as of 11/17/12         As of Today 10/14/12 09/22/12 09/17/12 09/17/12     Blood Pressure    . Blood Pressure < 140/80  137/77 165/102 185/117 185/78 189/81     Result Component    . HEMOGLOBIN A1C < 7.0  5.8        . LDL CALC < 100            Progress Toward Treatment Goals:  Treatment Goal 11/17/2012  Hemoglobin A1C at goal  Blood pressure at goal    Self Care Goals & Plans:  Self Care Goal 11/17/2012  Manage my medications take my medicines as prescribed  Monitor my health bring my glucose meter and log to each visit  Be physically active find an activity I enjoy  Meeting treatment goals maintain the current self-care plan    Home Blood Glucose Monitoring 11/17/2012  Check my blood sugar once a day     Care Management & Community Referrals:

## 2012-11-17 NOTE — Assessment & Plan Note (Signed)
Repeat lipid profile today. Continue atorvastatin 10 mg daily

## 2012-11-17 NOTE — Progress Notes (Signed)
Subjective:   Patient ID: [name] female   DOB: 28-Apr-1946 67 y.o.   MRN: [id number]  HPI: [name] is a 67 y.o. woman with history of diabetes, hypertension, hyperlipidemia, and breast cancer who presents today for routine followup.  I last saw patient in August of 2013, and since then, she has undergone right knee arthroplasty with subsequent infection, for which she has completed IV antibiotics and is currently taking doxycycline 100 mg twice daily for the next month. She is being followed ID clinic. She was recently discharged from SNF on June 12th, continues to have some nausea due to doxycycline & has vomiting only once because couldn't swallow potassium pill.   Doing fine with DM. Checks CBGs occasionally no dizziness/sweating. Reports okay appetite occasionally. She has been taking metformin twice daily.  She continues to be compliant with her anastrozole for her breast cancer.  Review of Systems: Constitutional: Denies fever, chills, diaphoresis.  Respiratory: Denies SOB, DOE, cough, chest tightness, and wheezing.  Cardiovascular: Denies chest pain, palpitations and leg swelling.  Gastrointestinal: Denies abdominal pain, diarrhea, constipation,blood in stool and abdominal distention.  Genitourinary: Denies dysuria, urgency, frequency, hematuria, flank pain and difficulty urinating.  Musculoskeletal: Continues to have right knee pain, for which she occasionally takes Dilaudid Neurological: Denies dizziness, seizures, syncope, weakness, lightheadedness, numbness and headaches.    Past Medical History  Diagnosis Date  . Allergy   . Hypertension   . Hyperlipidemia   . Fatty liver disease, nonalcoholic   . History of hyperkalemia   . Obesity   . Renal lesion   . Hx antineoplastic chemo  10/12 - 06/24/11    PACLITAX EL COMPLETED 06/24/11  . Anemia   . Diabetes mellitus ORAL MED  . OA (osteoarthritis) of knee     RIGHT LEG  . Mild nonproliferative  diabetic retinopathy of right eye 03/19/11    Dr. Norva Pavlov  . Breast CA     (Rt) breast ca dx 4/12---  S/P RADICAL MASTECTOMY AND CHEMORADIATION  . Numbness and tingling     Hx: of in fingers and toes since chemotherapy   Current Outpatient Prescriptions  Medication Sig Dispense Refill  . anastrozole (ARIMIDEX) 1 MG tablet TAKE ONE TABLET BY MOUTH DAILY  30 tablet  3  . aspirin EC 325 MG tablet Take 325 mg by mouth 2 (two) times daily. For 2 weeks until 09/22/12      . diphenhydrAMINE (BENADRYL) 25 mg capsule Take 1 capsule (25 mg total) by mouth every 4 (four) hours as needed for itching.  24 capsule  2  . doxycycline (VIBRA-TABS) 100 MG tablet Take 1 tablet (100 mg total) by mouth 2 (two) times daily.  60 tablet  2  . hydrALAZINE (APRESOLINE) 10 MG tablet Take 10 mg by mouth every 8 (eight) hours as needed. SBP greater than 160      . HYDROcodone-acetaminophen (NORCO/VICODIN) 5-325 MG per tablet Take 1 tablet by mouth every 4 (four) hours as needed for pain.      Marland Kitchen HYDROmorphone (DILAUDID) 2 MG tablet Take 2 mg by mouth every 8 (eight) hours.      Marland Kitchen HYDROmorphone (DILAUDID) 2 MG tablet Take 4 mg by mouth every 4 (four) hours as needed for pain.      . metFORMIN (GLUCOPHAGE) 500 MG tablet Take 500 mg by mouth 2 (two) times daily with a meal.      . methocarbamol (ROBAXIN) 500 MG tablet Take 1 tablet (500 mg total)  by mouth every 6 (six) hours as needed.  60 tablet  0  . metoprolol succinate (TOPROL-XL) 50 MG 24 hr tablet Take 50 mg by mouth 2 (two) times daily. Take with or immediately following a meal.      . oxyCODONE-acetaminophen (PERCOCET/ROXICET) 5-325 MG per tablet Take 1 tablet by mouth every 4 (four) hours as needed for pain.      . Polyvinyl Alcohol-Povidone (REFRESH OP) Place 1 drop into both eyes daily as needed. Dry eyes      . pravastatin (PRAVACHOL) 40 MG tablet Take 1 tablet (40 mg total) by mouth at bedtime.  90 tablet  1  . promethazine (PHENERGAN) 25 MG tablet Take 25  mg by mouth every 6 (six) hours as needed for nausea.      Melene Muller ON 04/19/2013] PROMETHAZINE HCL IM Inject 25 mg into the muscle every 6 (six) hours as needed.       No current facility-administered medications for this visit.   Family History  Problem Relation Age of Onset  . Stroke Father   . Heart disease Sister   . Cancer Sister     breast  . Heart disease Brother   . Cancer Sister     COLON CANCER   History   Social History  . Marital Status: Married    Spouse Name: N/A    Number of Children: N/A  . Years of Education: N/A   Social History Main Topics  . Smoking status: Never Smoker   . Smokeless tobacco: Never Used  . Alcohol Use: No  . Drug Use: No  . Sexually Active: Yes    Birth Control/ Protection: Surgical     Comment: MENARCHE AGE 67, G4, P4, PARITY AGE 11, HRT 20+ YEARS     Other Topics Concern  . 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  Patient needs to submit further paperwork to complete- Rudell Cobb 08/14/09.   Financial assistance application completed.  Patient does not qualify for assistance-over income Rudell Cobb 09/12/09.             Objective:  Physical Exam: Filed Vitals:   11/17/12 1441  BP: 137/77  Pulse: 81  Temp: 97.1 F (36.2 C)  TempSrc: Oral  SpO2: 99%   General: Appears as stated age HEENT: PERRL, EOMI, no scleral icterus Cardiac: RRR, no rubs, murmurs or gallops Pulm: clear to auscultation bilaterally, moving normal volumes of air Abd: soft, nontender, nondistended, BS present Ext: warm and well perfused, no pedal edema; right knee incision scar without erythema, right knee more swollen than left knee but with decent range of motion Neuro: alert and oriented X3, cranial nerves II-XII grossly intact   Assessment & Plan:  Case and care discussed with Dr.  Criselda Peaches.  Please see problem oriented charting for further details. Patient to return in 3 months for diabetes followup.

## 2012-11-17 NOTE — Assessment & Plan Note (Signed)
-   Pneumovax administered today.

## 2012-11-18 DIAGNOSIS — Z96659 Presence of unspecified artificial knee joint: Secondary | ICD-10-CM | POA: Diagnosis not present

## 2012-11-18 DIAGNOSIS — M25569 Pain in unspecified knee: Secondary | ICD-10-CM | POA: Diagnosis not present

## 2012-11-18 LAB — BASIC METABOLIC PANEL
BUN: 25 mg/dL — ABNORMAL HIGH (ref 6–23)
CO2: 28 mEq/L (ref 19–32)
Calcium: 9.6 mg/dL (ref 8.4–10.5)
Chloride: 101 mEq/L (ref 96–112)
Creat: 1.19 mg/dL — ABNORMAL HIGH (ref 0.50–1.10)
Glucose, Bld: 82 mg/dL (ref 70–99)
Potassium: 3.3 mEq/L — ABNORMAL LOW (ref 3.5–5.3)
Sodium: 138 mEq/L (ref 135–145)

## 2012-11-18 LAB — LIPID PANEL
Cholesterol: 149 mg/dL (ref 0–200)
HDL: 44 mg/dL (ref >39)
LDL Cholesterol: 76 mg/dL (ref 0–99)
Total CHOL/HDL Ratio: 3.4 Ratio
Triglycerides: 143 mg/dL (ref <150)
VLDL: 29 mg/dL (ref 0–40)

## 2012-11-19 ENCOUNTER — Other Ambulatory Visit: Payer: Self-pay | Admitting: Oncology

## 2012-11-19 DIAGNOSIS — Z1231 Encounter for screening mammogram for malignant neoplasm of breast: Secondary | ICD-10-CM

## 2012-11-25 ENCOUNTER — Encounter: Payer: Self-pay | Admitting: Internal Medicine

## 2012-11-25 ENCOUNTER — Ambulatory Visit (INDEPENDENT_AMBULATORY_CARE_PROVIDER_SITE_OTHER): Payer: Medicare Other | Admitting: Internal Medicine

## 2012-11-25 VITALS — BP 171/92 | HR 80 | Temp 97.9°F | Wt 146.0 lb

## 2012-11-25 DIAGNOSIS — M009 Pyogenic arthritis, unspecified: Secondary | ICD-10-CM | POA: Diagnosis not present

## 2012-11-25 MED ORDER — DOXYCYCLINE HYCLATE 100 MG PO TABS: 100.0000 mg | ORAL_TABLET | Freq: Two times a day (BID) | ORAL | Status: DC

## 2012-11-25 NOTE — Progress Notes (Signed)
[facility] NOTE  RFV: follow up for MRSA pji Subjective:    Patient ID: [name], female    DOB: December 08, 1945, 67 y.o.   MRN: [id number]  HPI 67yo F with HTN, DM, GERD, CKD, breast ca 2012 now on arimidex who was hospitalized for MRSA PJI who underwent 5/2 I x D and polyexchange. Currently on IV vancomycin and rifampin 300mg  daily.  having difficulty with managing BP at her nursing home. Has occ headaches. Has poor appetite due to cancer medication. She finished 6 wks of IV vanco but did not tolerate rifampin on 6/13, then switched to doxycycline   Doing better, working on physical therapy Current Outpatient Prescriptions on File Prior to Visit  Medication Sig Dispense Refill  . atorvastatin (LIPITOR) 10 MG tablet Take 10 mg by mouth at bedtime.      . metoprolol succinate (TOPROL-XL) 50 MG 24 hr tablet Take 50 mg by mouth 2 (two) times daily. Take with or immediately following a meal.      . anastrozole (ARIMIDEX) 1 MG tablet TAKE ONE TABLET BY MOUTH DAILY  30 tablet  3  . HYDROmorphone (DILAUDID) 2 MG tablet Take 2 mg by mouth every 8 (eight) hours.      . methocarbamol (ROBAXIN) 500 MG tablet Take 1 tablet (500 mg total) by mouth every 6 (six) hours as needed.  60 tablet  0  . Polyvinyl Alcohol-Povidone (REFRESH OP) Place 1 drop into both eyes daily as needed. Dry eyes      . promethazine (PHENERGAN) 25 MG tablet Take 25 mg by mouth every 6 (six) hours as needed for nausea.       No current facility-administered medications on file prior to visit.   Active Ambulatory Problems    Diagnosis Date Noted  . DIABETES MELLITUS, TYPE II 03/10/2006  . HYPERLIPIDEMIA 03/10/2006  . OBESITY NOS 07/09/2006  . ANEMIA-NOS 03/10/2006  . Hypertension associated with diabetes 03/10/2006  . ALLERGIC RHINITIS 03/10/2006  . FATTY LIVER DISEASE 03/10/2006  . OSTEOARTHRITIS 03/25/2007  . BACK PAIN, LUMBAR 10/04/2008  . COLONIC POLYPS, HX OF 07/19/2007  . Breast cancer metastasized to axillary  lymph node 09/25/2010  . Cancer of central portion of female breast 06/25/2011  . Contact dermatitis due to soap 12/18/2011  . Chest pain at rest, assoc. with nausea, racing heart,  03/11/2012  . New onset of headaches after age 72 03/16/2012  . Arthritis of knee, right 08/16/2012  . Infection of prosthetic knee joint 09/03/2012  . Health care maintenance 11/17/2012   Resolved Ambulatory Problems    Diagnosis Date Noted  . Axillary mass 09/20/2010  . Seasonal allergies 01/20/2011  . Acute bacterial sinusitis 10/01/2011  . Cough 10/01/2011   Past Medical History  Diagnosis Date  . Allergy   . Hypertension   . Hyperlipidemia   . Fatty liver disease, nonalcoholic   . History of hyperkalemia   . Obesity   . Renal lesion   . Hx antineoplastic chemo  10/12 - 06/24/11  . Anemia   . Diabetes mellitus ORAL MED  . OA (osteoarthritis) of knee   . Mild nonproliferative diabetic retinopathy of right eye 03/19/11  . Breast CA   . Numbness and tingling    History  Substance Use Topics  . Smoking status: Never Smoker   . Smokeless tobacco: Never Used  . Alcohol Use: No     Review of Systems Lost 15# decrease appetite    Objective:   Physical Exam BP 171/92  Pulse 80  Temp(Src) 97.9 F (36.6 C) (Oral)  Wt 146 lb (66.225 kg)  BMI 29.47 kg/m2 gen = a x o by 3 in NAD  HEENT= no thrush, op clear Lungs = ctab, no w/c/r Cors = nl s1,s2, no g/m/r Ext = Right knee slightly warm,        Assessment & Plan:  mrsa pji = Refill doxycycline 100mg  BID for addn 3 months  rtc in 2 months labs at that visit

## 2012-11-29 ENCOUNTER — Ambulatory Visit (INDEPENDENT_AMBULATORY_CARE_PROVIDER_SITE_OTHER): Payer: Medicare Other | Admitting: Internal Medicine

## 2012-11-29 ENCOUNTER — Encounter: Payer: Self-pay | Admitting: Internal Medicine

## 2012-11-29 ENCOUNTER — Encounter: Payer: Self-pay | Admitting: *Deleted

## 2012-11-29 ENCOUNTER — Other Ambulatory Visit: Payer: Self-pay | Admitting: Lab

## 2012-11-29 VITALS — BP 149/86 | HR 86 | Temp 99.4°F | Ht 59.0 in | Wt 148.8 lb

## 2012-11-29 DIAGNOSIS — E1159 Type 2 diabetes mellitus with other circulatory complications: Secondary | ICD-10-CM

## 2012-11-29 DIAGNOSIS — E1169 Type 2 diabetes mellitus with other specified complication: Secondary | ICD-10-CM

## 2012-11-29 DIAGNOSIS — I10 Essential (primary) hypertension: Secondary | ICD-10-CM

## 2012-11-29 DIAGNOSIS — I152 Hypertension secondary to endocrine disorders: Secondary | ICD-10-CM

## 2012-11-29 MED ORDER — LISINOPRIL 40 MG PO TABS: 40.0000 mg | ORAL_TABLET | Freq: Every day | ORAL | Status: DC

## 2012-11-29 NOTE — Patient Instructions (Addendum)
Thank you for your visit - Because your kidney function has recovered, we are adding back your lisinopril 40 mg per day. A prescription has been sent to your pharmacy. - We would also like to do some urine studies today related to your kidney and diabetes. - Please return in 2 weeks for followup blood pressure check and lab work

## 2012-11-29 NOTE — Progress Notes (Signed)
  Subjective:    Patient ID: [name], female    DOB: 08/27/1945, 67 y.o.   MRN: [id number]  Hypertension Associated symptoms include headaches. Pertinent negatives include no chest pain or shortness of breath.   [name] is a 67 y.o. woman with a past medical of diabetes, hypertension, hyperlipidemia, and breast cancer who presents today for blood pressure followup.  In April the patient was admitted to the hospital for septic arthritis, and subsequently developed acute renal failure. As a result, several of her blood pressure medications with nephrotoxic side effects were discontinued at that time, including lisinopril and HCTZ. Her renal function has since recovered, with her last creatinine being 1.19 on 11/17/2012, so we have been slowly adding back her meds here in clinic. She is currently on HCTZ 25 mg daily and Toprol-XL 50 mg twice a day  Her blood pressure today is 149/86. She does have a blood pressure cuff at home, but she did not write down the values. She notes from memory that several systolic readings have been in the 160s. She has noted at least one diastolic reading of 108. She complains of a slight headache, but denies blurry vision, lightheadedness, weakness, numbness.    Review of Systems  Constitutional: Negative for fever.  Eyes: Negative for visual disturbance.  Respiratory: Negative for shortness of breath.   Cardiovascular: Negative for chest pain.  Neurological: Positive for headaches. Negative for weakness, light-headedness and numbness.       Objective:   Physical Exam  Constitutional: [name], place, and time. She appears well-developed and well-nourished. No distress.  Eyes: Conjunctivae and EOM are normal. Pupils are equal, round, and reactive to light.  Neck: Normal range of motion. Neck supple.  Cardiovascular: Normal rate, regular rhythm, normal heart sounds and intact distal pulses.  Exam reveals no gallop and no  friction rub.   No murmur heard. Pulmonary/Chest: Effort normal and breath sounds normal. She has no wheezes. She has no rales.  Musculoskeletal: Normal range of motion. She exhibits no edema.  Neurological: She is alert and oriented to person, place, and time. She has normal strength.  Skin: Skin is warm and dry. She is not diaphoretic.          Assessment & Plan:

## 2012-11-29 NOTE — Assessment & Plan Note (Addendum)
BP Readings from Last 3 Encounters:  11/29/12 149/86  11/25/12 171/92  11/17/12 137/77    Lab Results  Component Value Date   NA 138 11/17/2012   K 3.3* 11/17/2012   CREATININE 1.19* 11/17/2012    Assessment: Blood pressure control: mildly elevated Progress toward BP goal:  improved  Plan: Medications:  continue current medications, and add back Lisinopril 40mg  daily Other plans: Blood pressure today is within the target range for a 67 year old woman, however she notes some higher readings at home. She also has a history of diabetes.  - We will therefore restart lisinopril 40 mg daily now that her renal function has recovered. - Checking a urine micro albumin/creatinine ratio (last measured in 2012)  ADDENDUM: urine micro albumin/creatinine ratio WNL

## 2012-11-29 NOTE — Progress Notes (Signed)
Pt stopped by clinic with c/o elevated BP.  Last reading 168/99 at home.  She went to therapy and BP 160/101. Pt is taking all BP meds.  No other changes.  Will see today.

## 2012-11-30 ENCOUNTER — Ambulatory Visit (HOSPITAL_COMMUNITY)
Admission: RE | Admit: 2012-11-30 | Discharge: 2012-11-30 | Disposition: A | Discharge status: Home / Self Care | Payer: Medicare Other | Source: Ambulatory Visit | Attending: Oncology | Admitting: Oncology

## 2012-11-30 ENCOUNTER — Other Ambulatory Visit: Payer: Self-pay | Admitting: Oncology

## 2012-11-30 DIAGNOSIS — Z1231 Encounter for screening mammogram for malignant neoplasm of breast: Secondary | ICD-10-CM

## 2012-11-30 LAB — MICROALBUMIN / CREATININE URINE RATIO
Creatinine, Urine: 56.1 mg/dL
Microalb Creat Ratio: 13.5 mg/g (ref 0.0–30.0)
Microalb, Ur: 0.76 mg/dL (ref 0.00–1.89)

## 2012-11-30 NOTE — Progress Notes (Signed)
Case discussed with Dr. Cater (at time of visit, soon after the resident saw the patient).  We reviewed the resident's history and exam and pertinent patient test results.  I agree with the assessment, diagnosis, and plan of care documented in the resident's note. 

## 2012-12-01 ENCOUNTER — Other Ambulatory Visit: Payer: Self-pay | Admitting: Geriatric Medicine

## 2012-12-02 DIAGNOSIS — Z96659 Presence of unspecified artificial knee joint: Secondary | ICD-10-CM | POA: Diagnosis not present

## 2012-12-02 DIAGNOSIS — M25569 Pain in unspecified knee: Secondary | ICD-10-CM | POA: Diagnosis not present

## 2012-12-03 ENCOUNTER — Ambulatory Visit (INDEPENDENT_AMBULATORY_CARE_PROVIDER_SITE_OTHER): Payer: Medicare Other | Admitting: Surgery

## 2012-12-03 ENCOUNTER — Encounter (INDEPENDENT_AMBULATORY_CARE_PROVIDER_SITE_OTHER): Payer: Self-pay | Admitting: Surgery

## 2012-12-03 VITALS — BP 168/100 | HR 80 | Temp 98.0°F | Resp 18 | Ht 59.0 in | Wt 147.0 lb

## 2012-12-03 DIAGNOSIS — Z853 Personal history of malignant neoplasm of breast: Secondary | ICD-10-CM | POA: Diagnosis not present

## 2012-12-03 NOTE — Progress Notes (Signed)
NAME: [name]       DOB: [birth year]           DATE: 12/03/2012       MRN: [id number]   [name] is a 67 y.o.Marland Kitchenfemale who presents for routine followup of her Stage II right breast cancerdiagnosed in 2012  and treated with right modified mastectomy  Chemotherapy and radiation therapy. She has no problems or concerns on either side.she has interest in plastic surgery consultation for reconstruction. She underwent right knee replacement this year complicated  by infection.  PFSH: She has had no significant changes since the last visit here.  ROS: There have been no significant changes since the last visit here  EXAM:  VS: BP 168/100  Pulse 80  Temp(Src) 98 F (36.7 C) (Temporal)  Resp 18  Ht 4\' 11"  (1.499 m)  Wt 147 lb (66.679 kg)  BMI 29.67 kg/m2   General: The patient is alert, oriented, generally healthy appearing, NAD. Mood and affect are normal.  Breasts:  right breast surgically absent. Skin redundancy noted. No masses. Left breast normal. Left nipple normal.  Lymphatics: She has no axillary or supraclavicular adenopathy on either side.  Extremities: Full ROM of the surgical side with no lymphedema noted.  Data Reviewed:  LEFT DIGITAL SCREENING MAMMOGRAM WITH CAD  Comparison films: Multiple priors  Findings: The patient has had a right mastectomy. Two views of  the left breast demonstrate scattered fibroglandular densities. No  findings suspicious for malignancy.  Images were processed with CAD.  IMPRESSION:  No evidence of malignancy. Screening mammography is recommended in  one year.  BI-RADS CATEGORY 1: Negative  Original Report Authenticated By: Hiram Gash, M.D.     Impression: Doing well, with no evidence of recurrent cancer or new cancer  Plan: Will continue to follow up on an annual basis here.

## 2012-12-03 NOTE — Patient Instructions (Signed)
Yearly mammogram.  Return 1 year.

## 2012-12-06 ENCOUNTER — Other Ambulatory Visit: Payer: Self-pay

## 2012-12-06 ENCOUNTER — Ambulatory Visit (HOSPITAL_BASED_OUTPATIENT_CLINIC_OR_DEPARTMENT_OTHER): Payer: Medicare Other | Admitting: Oncology

## 2012-12-06 ENCOUNTER — Telehealth: Payer: Self-pay | Admitting: *Deleted

## 2012-12-06 VITALS — BP 172/91 | HR 86 | Temp 98.5°F | Resp 20 | Ht 59.0 in | Wt 147.2 lb

## 2012-12-06 DIAGNOSIS — Z170 Estrogen receptor positive status [ER+]: Secondary | ICD-10-CM

## 2012-12-06 DIAGNOSIS — C50319 Malignant neoplasm of lower-inner quadrant of unspecified female breast: Secondary | ICD-10-CM

## 2012-12-06 DIAGNOSIS — C50919 Malignant neoplasm of unspecified site of unspecified female breast: Secondary | ICD-10-CM

## 2012-12-06 DIAGNOSIS — C773 Secondary and unspecified malignant neoplasm of axilla and upper limb lymph nodes: Secondary | ICD-10-CM

## 2012-12-06 DIAGNOSIS — M899 Disorder of bone, unspecified: Secondary | ICD-10-CM | POA: Diagnosis not present

## 2012-12-06 DIAGNOSIS — M949 Disorder of cartilage, unspecified: Secondary | ICD-10-CM

## 2012-12-06 NOTE — Telephone Encounter (Signed)
appts made and printed...td 

## 2012-12-06 NOTE — Progress Notes (Signed)
ID: [name]   DOB: 05/28/1945  MR#: [id number]  NWG#:[id number]   PCP: Stacy Gardner, MD GYN: Konrad Dolores SU: Thomas Cornett OTHER MD: Gean Birchwood, Enedina Finner  HISTORY OF PRESENT ILLNESS: [name] had screening mammography Sep 11, 2010, at [facility]. This showed a possible right axillary mass and she was brought back for additional views Sep 20, 2010. An ultrasound showed abnormal enlarged lymph nodes in the right axilla measuring a maximum of 2.2 cm. Biopsy was suggested and performed on Sep 25, 2010. The pathology from that procedure ([id number]) showed metastatic adenocarcinoma consistent with a breast primary. In particular, the tumor was estrogen and progesterone receptor positive, HER2/neu was negative, the MIB-1 was 40%.  With this information, the patient was referred for bilateral breast MRIs performed May 29. This showed a complex picture, there being in addition to 2 pathologically enlarged lymph nodes in the right axilla and then other mildly prominent lymph nodes, 2 masses in the breast itself, one was subareolar and measured 1.3 cm, the second mass was more posterior and inferior and measured 8 mm. The distance between these 2 masses was 4.5 cm. the second breast mass was biopsied and was found to be benign.  She has a deleterious BRCA-2 mutation, namely 6696 deletion TC.  Status post right mastectomy and axillary lymph node dissection December 04, 2010 for a 1.5 cm invasive ductal carcinoma, grade 3, involving 3/12 axillary lymph nodes, with ample margins, T1c N1 M0, estrogen receptor 94% positive, progesterone receptor 4% positive with an MIB-1 of 62% and no HER2 amplification on the original material. Further therapy as detailed below.  INTERVAL HISTORY:  [name] returns today for followup of her right breast carcinoma. The interval history is significant for her having undergone right knee surgery under Dr. Phylliss Bob 1. She tells me she had a superficial infection associated  with this. She has also had significant problems with her blood pressure and sugar which is being worked up by Dr. Everardo Beals  REVIEW OF SYSTEMS:  [name] is tolerating the anastrozole with no significant side effects, and in particular does not experience hot flashes. She does have vaginal dryness she does not feel this is any worse than it used to be before she started that medication. She has seasonal allergies but otherwise a detailed review of systems today was entirely noncontributory   PAST MEDICAL HISTORY: Past Medical History  Diagnosis Date  . Allergy   . Hypertension   . Hyperlipidemia   . Fatty liver disease, nonalcoholic   . History of hyperkalemia   . Obesity   . Renal lesion   . Hx antineoplastic chemo  10/12 - 06/24/11    PACLITAX EL COMPLETED 06/24/11  . Anemia   . Diabetes mellitus ORAL MED  . OA (osteoarthritis) of knee     RIGHT LEG  . Mild nonproliferative diabetic retinopathy of right eye 03/19/11    Dr. Norva Pavlov  . Breast CA     (Rt) breast ca dx 4/12---  S/P RADICAL MASTECTOMY AND CHEMORADIATION  . Numbness and tingling     Hx: of in fingers and toes since chemotherapy    PAST SURGICAL HISTORY: Past Surgical History  Procedure Laterality Date  . Knee arthroscopy  10-28-1999    RIGHT  . Mastectomy modified radical  12-04-2010    W/ LEFT PAC PLACEMENT (RIGHT BREAST W/ AXILLARY CONTENTS/ NODE BX'S  . Transthoracic echocardiogram  10-29-2010    NORMAL LVF/ EF 55-60%/ MILD MV REGURG  . Vaginal hysterectomy  AGE 67    W/ LSO  . Tubal ligation  YRS AGO  . Laparoscopy with laparoscopic right salpingo oophorectomy and lysis of pelvic and abdominal adhesions  March 2013    Dr. Jennette Kettle   . Portacath placement      REPLACED [facility] DUE TO MALFUNCTION (LEFT)  . Port-a-cath removal  09/24/2011    Procedure: REMOVAL PORT-A-CATH;  Surgeon: Clovis Pu. Cornett, MD;  Location: [facility] ORS;  Service: General;  Laterality: N/A;  . Colonoscopy w/ polypectomy      Hx; of  .  Total knee arthroplasty Right 08/16/2012    Procedure: TOTAL KNEE ARTHROPLASTY;  Surgeon: [name], MD;  Location: [facility] OR;  Service: Orthopedics;  Laterality: Right;  . I&d knee with poly exchange Right 09/03/2012    Procedure: IRRIGATION AND DEBRIDEMENT RIGHT KNEE WITH POLY EXCHANGE;  Surgeon: [name], MD;  Location: [facility] OR;  Service: Orthopedics;  Laterality: Right;    FAMILY HISTORY Family History  Problem Relation Age of Onset  . Stroke Father   . Heart disease Sister   . Cancer Sister     breast  . Heart disease Brother   . Cancer Sister     COLON CANCER  patient's mother alive age 2; father deceased, cause unknown. The patient has 8 sisters and 9 brothers.  GYNECOLOGIC HISTORY: GX, P4, first pregnancy age 67; hysterectomy age 67 with USO; right USO 2013; on premarin >20 years, discontinued at the time of breast cancer diagnosis  SOCIAL HISTORY: retired from office work; husband Derrick works as a Curator; 4 daughters, 3 in [place] {[place], [place] and [place]), 1 in [place] ([place]); 3 grandsons; attends [employer].   ADVANCED DIRECTIVES:  HEALTH MAINTENANCE: History  Substance Use Topics  . Smoking status: Never Smoker   . Smokeless tobacco: Never Used  . Alcohol Use: No     Colonoscopy: Approx 2011  PAP:  UTD   Bone density: August 2013;Young Adult T Score: -1.3  Lipid panel: followed by PCP, UTD  Allergies  Allergen Reactions  . Metformin Other (See Comments)    REACTION, diarrhea: 850 mg - diarrhea but tolerates 500 mg daily    Current Outpatient Prescriptions  Medication Sig Dispense Refill  . anastrozole (ARIMIDEX) 1 MG tablet TAKE ONE TABLET BY MOUTH DAILY  30 tablet  3  . atorvastatin (LIPITOR) 10 MG tablet Take 10 mg by mouth at bedtime.      Marland Kitchen doxycycline (VIBRA-TABS) 100 MG tablet Take 1 tablet (100 mg total) by mouth 2 (two) times daily.  60 tablet  2  . hydrochlorothiazide (HYDRODIURIL) 25 MG tablet Take 25 mg by mouth once.       Marland Kitchen HYDROmorphone (DILAUDID) 2 MG tablet Take 2 mg by mouth every 8 (eight) hours.      Marland Kitchen lisinopril (PRINIVIL,ZESTRIL) 40 MG tablet Take 1 tablet (40 mg total) by mouth daily.  30 tablet  3  . methocarbamol (ROBAXIN) 500 MG tablet Take 1 tablet (500 mg total) by mouth every 6 (six) hours as needed.  60 tablet  0  . metoprolol succinate (TOPROL-XL) 50 MG 24 hr tablet Take 50 mg by mouth 2 (two) times daily. Take with or immediately following a meal.      . Polyvinyl Alcohol-Povidone (REFRESH OP) Place 1 drop into both eyes daily as needed. Dry eyes      . promethazine (PHENERGAN) 25 MG tablet Take 25 mg by mouth every 6 (six) hours as needed for nausea.  No current facility-administered medications for this visit.    OBJECTIVE: Middle-aged [place] American woman who appears stated age 67 Vitals:   12/06/12 0912  BP: 172/91  Pulse: 86  Temp: 98.5 F (36.9 C)  Resp: 20     Body mass index is 29.71 kg/(m^2).    ECOG FS:0 Filed Weights   12/06/12 0912  Weight: 147 lb 3.2 oz (66.769 kg)   Sclerae unicteric Oropharynx clear No cervical or supraclavicular adenopathy Lungs no rales or rhonchi Heart regular rate and rhythm Abd benign MSK no focal spinal tenderness, no peripheral edema, right knee scars nicely healed over Neuro: nonfocal, well oriented, pleasant affect Breasts: The right breast is status post mastectomy. There is no evidence of local recurrence. There remains mild hyperpigmentation over the radiation port. The right axilla is benign. The left breast is unremarkable.   LAB RESULTS: Lab Results  Component Value Date   WBC 6.2 09/17/2012   NEUTROABS 4.7 09/17/2012   HGB 8.8* 09/17/2012   HCT 26.2* 09/17/2012   MCV 88.2 09/17/2012   PLT 274 09/17/2012      Chemistry      Component Value Date/Time   NA 138 11/17/2012 1535   NA 141 05/27/2012 0947   K 3.3* 11/17/2012 1535   K 3.6 05/27/2012 0947   CL 101 11/17/2012 1535   CL 100 05/27/2012 0947   CO2 28 11/17/2012  1535   CO2 30* 05/27/2012 0947   BUN 25* 11/17/2012 1535   BUN 15.0 05/27/2012 0947   CREATININE 1.19* 11/17/2012 1535   CREATININE 1.60* 09/17/2012 0156   CREATININE 0.8 05/27/2012 0947   GLU 132* 03/25/2011 0957      Component Value Date/Time   CALCIUM 9.6 11/17/2012 1535   CALCIUM 10.1 05/27/2012 0947   ALKPHOS 60 09/22/2012 1048   ALKPHOS 63 05/27/2012 0947   AST 17 09/22/2012 1048   AST 15 05/27/2012 0947   ALT 17 09/22/2012 1048   ALT 20 05/27/2012 0947   BILITOT 0.5 09/22/2012 1048   BILITOT 0.55 05/27/2012 0947       Lab Results  Component Value Date   LABCA2 12 05/27/2012    STUDIES: Mm Digital Screening Unilat L  12/01/2012   *RADIOLOGY REPORT*  Clinical Data:  Screening.  LEFT DIGITAL SCREENING MAMMOGRAM WITH CAD  Comparison: Previous examinations  FINDINGS:  ACR Breast Density Category: c:  The breasts are heterogeneously dense, which may obscure small masses.  The patient has had a right mastectomy.  No suspicious masses, architectural distortion, or calcifications are present.  Images were processed with CAD.  IMPRESSION: No evidence of malignancy.  Screening mammography is recommended in one year.  RECOMMENDATION: Screening mammogram in one year. (Code:SM-B-01Y)  BI-RADS CATEGORY 1:  Negative.   Original Report Authenticated By: Rolla Plate, M.D.    ASSESSMENT:67 y.o.  BRCA-2 positive [employer] woman,  (1) status post right mastectomy and axillary lymph node dissection August of 2012 for a T1c N1 (Stage IIA) invasive ductal carcinoma, grade 3, estrogen and progesterone receptor positive, HER-2 negative, with an MIB-1-1 of 62%,   (2) Status post 4 cycles of doxorubicin and cyclophosphamide given in dose dense fashion, followed  by weekly paclitaxel x 12 completed 06/24/2011  (3) received post-mastectomy radiation completed April 2013  (4)  started on anastrozole in May 2013; most recent mammogram August of 2013 showed mild osteopenia  (5) s/p remote hysterectomy and  left salpingo-oophorectomy; s/p right salpingo-oophorectomy March 2013 with benign pathology  PLAN:  Claris Che  is tolerating the anastrozole well, with vaginal dryness as the chief concern. At this point, 2 years out from her original diagnosis, we are going to start seeing her on a once a year basis. She will have her mammogram in July or early August of 2015 and see [place] shortly afterwards. She will be due for repeat bone density around that time as well.  Today I gave her the results of her CT scan in February of this year, which shows resolution of the lesion we have been following. I also gave her a copy of her most recent mammogram. She knows to call for any problems that may develop before the next visit here.  Windel Keziah C    12/06/2012

## 2012-12-07 DIAGNOSIS — M25569 Pain in unspecified knee: Secondary | ICD-10-CM | POA: Diagnosis not present

## 2012-12-07 DIAGNOSIS — Z96659 Presence of unspecified artificial knee joint: Secondary | ICD-10-CM | POA: Diagnosis not present

## 2012-12-07 NOTE — Progress Notes (Signed)
Case discussed with Dr. Sharda at the time of the visit.  We reviewed the resident's history and exam and pertinent patient test results.  I agree with the assessment, diagnosis, and plan of care documented in the resident's note.    

## 2012-12-08 ENCOUNTER — Other Ambulatory Visit: Payer: Self-pay | Admitting: Geriatric Medicine

## 2012-12-09 DIAGNOSIS — Z96659 Presence of unspecified artificial knee joint: Secondary | ICD-10-CM | POA: Diagnosis not present

## 2012-12-09 DIAGNOSIS — M25569 Pain in unspecified knee: Secondary | ICD-10-CM | POA: Diagnosis not present

## 2012-12-13 ENCOUNTER — Ambulatory Visit: Payer: Self-pay | Admitting: Oncology

## 2012-12-14 ENCOUNTER — Other Ambulatory Visit: Payer: Self-pay | Admitting: Adult Health

## 2012-12-15 ENCOUNTER — Encounter: Payer: Self-pay | Admitting: Internal Medicine

## 2012-12-15 ENCOUNTER — Ambulatory Visit (INDEPENDENT_AMBULATORY_CARE_PROVIDER_SITE_OTHER): Payer: Medicare Other | Admitting: Internal Medicine

## 2012-12-15 VITALS — BP 145/83 | HR 99 | Temp 98.9°F | Wt 151.3 lb

## 2012-12-15 DIAGNOSIS — N179 Acute kidney failure, unspecified: Secondary | ICD-10-CM

## 2012-12-15 DIAGNOSIS — E1169 Type 2 diabetes mellitus with other specified complication: Secondary | ICD-10-CM

## 2012-12-15 DIAGNOSIS — I10 Essential (primary) hypertension: Secondary | ICD-10-CM

## 2012-12-15 DIAGNOSIS — Z136 Encounter for screening for cardiovascular disorders: Secondary | ICD-10-CM

## 2012-12-15 DIAGNOSIS — E1159 Type 2 diabetes mellitus with other circulatory complications: Secondary | ICD-10-CM

## 2012-12-15 DIAGNOSIS — I152 Hypertension secondary to endocrine disorders: Secondary | ICD-10-CM

## 2012-12-15 DIAGNOSIS — E785 Hyperlipidemia, unspecified: Secondary | ICD-10-CM

## 2012-12-15 DIAGNOSIS — Z0130 Encounter for examination of blood pressure without abnormal findings: Secondary | ICD-10-CM

## 2012-12-15 LAB — BASIC METABOLIC PANEL
BUN: 30 mg/dL — ABNORMAL HIGH (ref 6–23)
CO2: 28 mEq/L (ref 19–32)
Calcium: 9.8 mg/dL (ref 8.4–10.5)
Chloride: 100 mEq/L (ref 96–112)
Creat: 1.36 mg/dL — ABNORMAL HIGH (ref 0.50–1.10)
Glucose, Bld: 162 mg/dL — ABNORMAL HIGH (ref 70–99)
Potassium: 3.7 mEq/L (ref 3.5–5.3)
Sodium: 138 mEq/L (ref 135–145)

## 2012-12-15 MED ORDER — ATORVASTATIN CALCIUM 10 MG PO TABS: 10.0000 mg | ORAL_TABLET | Freq: Every day | ORAL | Status: DC

## 2012-12-15 NOTE — Progress Notes (Signed)
Subjective:   Patient ID: [name] female   DOB: 04/20/1946 67 y.o.   MRN: [id number]  HPI: [name] is a 67 y.o. woman with history of diabetes, hypertension, hyperlipidemia, and breast cancer who presents today for BP follow up  To review, patient had undergone right knee infection over the summer, for which she is following with Dr. Drue Second (Doxy 100 BID through Oct).  During her hospitalization, she developed acute renal failure, so many of her medications had been d/c. She was seen by Dr. Claudell Kyle on 11/29/12 and lisinopril was restarted.    Patient has no complaints.  She notes that last month she did not her diastolic pressure was up to the 120s, but that was just prior to restarting lisinopril.  Review of Systems: Constitutional: Denies fever, chills, diaphoresis, appetite change and fatigue.   Respiratory: Denies SOB, DOE, cough, chest tightness, and wheezing.  Cardiovascular: Denies chest pain, palpitations and leg swelling.  Gastrointestinal: Denies nausea, vomiting, abdominal pain, diarrhea, constipation,blood in stool and abdominal distention.  Genitourinary: Denies dysuria, urgency, frequency, hematuria, flank pain and difficulty urinating.  Neurological: Denies dizziness, seizures, syncope, weakness, lightheadedness, numbness and headaches.    Past Medical History  Diagnosis Date  . Allergy   . Hypertension   . Hyperlipidemia   . Fatty liver disease, nonalcoholic   . History of hyperkalemia   . Obesity   . Renal lesion   . Hx antineoplastic chemo  10/12 - 06/24/11    PACLITAX EL COMPLETED 06/24/11  . Anemia   . Diabetes mellitus ORAL MED  . OA (osteoarthritis) of knee     RIGHT LEG  . Mild nonproliferative diabetic retinopathy of right eye 03/19/11    Dr. Norva Pavlov  . Breast CA     (Rt) breast ca dx 4/12---  S/P RADICAL MASTECTOMY AND CHEMORADIATION  . Numbness and tingling     Hx: of in fingers and toes since chemotherapy   Current Outpatient  Prescriptions  Medication Sig Dispense Refill  . anastrozole (ARIMIDEX) 1 MG tablet TAKE ONE TABLET BY MOUTH DAILY  30 tablet  3  . atorvastatin (LIPITOR) 10 MG tablet Take 10 mg by mouth at bedtime.      Marland Kitchen doxycycline (VIBRA-TABS) 100 MG tablet Take 1 tablet (100 mg total) by mouth 2 (two) times daily.  60 tablet  2  . hydrochlorothiazide (HYDRODIURIL) 25 MG tablet Take 25 mg by mouth once.      Marland Kitchen HYDROmorphone (DILAUDID) 2 MG tablet Take 2 mg by mouth every 8 (eight) hours.      Marland Kitchen lisinopril (PRINIVIL,ZESTRIL) 40 MG tablet Take 1 tablet (40 mg total) by mouth daily.  30 tablet  3  . methocarbamol (ROBAXIN) 500 MG tablet Take 1 tablet (500 mg total) by mouth every 6 (six) hours as needed.  60 tablet  0  . metoprolol succinate (TOPROL-XL) 50 MG 24 hr tablet Take 50 mg by mouth 2 (two) times daily. Take with or immediately following a meal.      . Polyvinyl Alcohol-Povidone (REFRESH OP) Place 1 drop into both eyes daily as needed. Dry eyes      . promethazine (PHENERGAN) 25 MG tablet Take 25 mg by mouth every 6 (six) hours as needed for nausea.       No current facility-administered medications for this visit.   Family History  Problem Relation Age of Onset  . Stroke Father   . Heart disease Sister   . Cancer Sister  breast  . Heart disease Brother   . Cancer Sister     COLON CANCER   History   Social History  . Marital Status: Married    Spouse Name: N/A    Number of Children: N/A  . Years of Education: N/A   Social History Main Topics  . Smoking status: Never Smoker   . Smokeless tobacco: Never Used  . Alcohol Use: No  . Drug Use: No  . Sexual Activity: Yes    Birth Control/ Protection: Surgical     Comment: MENARCHE AGE 84, G4, P4, PARITY AGE 61, HRT 20+ YEARS     Other Topics Concern  . 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  Patient needs to submit further paperwork to complete- Rudell Cobb 08/14/09.   Financial assistance application completed.  Patient does not qualify for assistance-over income Rudell Cobb 09/12/09.             Objective:   Physical Exam: Filed Vitals:   12/15/12 1525  BP: 145/83  Pulse: 99  Temp: 98.9 F (37.2 C)  TempSrc: Oral  Weight: 151 lb 4.8 oz (68.629 kg)  SpO2: 100%   HEENT: PERRL, EOMI, no scleral icterus Cardiac: RRR, no rubs, murmurs or gallops Pulm: clear to auscultation bilaterally, moving normal volumes of air Abd: soft, nontender, nondistended, BS normoactive Ext: warm and well perfused Neuro: alert and oriented X3, cranial nerves II-XII grossly intact  Assessment & Plan:   Case and care discussed with Dr. Aundria Rud.  Please see problem oriented charting for further details. Patient to return in 2 months for routine DM & HTN follow up.

## 2012-12-15 NOTE — Assessment & Plan Note (Signed)
Refilled lipitor 10mg  qHS

## 2012-12-15 NOTE — Assessment & Plan Note (Addendum)
BP Readings from Last 3 Encounters:  12/15/12 145/83  12/06/12 172/91  12/03/12 168/100    Lab Results  Component Value Date   NA 138 11/17/2012   K 3.3* 11/17/2012   CREATININE 1.19* 11/17/2012    Assessment: Blood pressure control: mildly elevated Progress toward BP goal:  improved  Plan: Medications:  continue current medications - lisinopril 40, metoprolol XL 50 & HCTZ 25 Educational resources provided:   Self management tools provided:   Other plans: BMET today after re-initiation of ACEI  ADDENDUM: 12/22/12, Cr 1.36, which is improved from acute renal failure in hospital, but up from 1.19 prior to ACEI re-initiation.  This is <30% increase. Will plan to recheck BMEt in 1 month.  Discussed with pt on 12/22/12 at 5:35p

## 2012-12-15 NOTE — Patient Instructions (Signed)
General Instructions:  -Continue taking your blood pressure medications as you have been, we will check your kidney function today.  If you notice elevations in your blood pressure (top number greater than 170 and bottom number greater than 100) please let me know.  Please be sure to bring all of your medications with you to every visit.  Should you have any new or worsening symptoms, please be sure to call the clinic at [phone].   Treatment Goals:  Goals (1 Years of Data) as of 12/15/12         As of Today 12/06/12 12/03/12 11/29/12 11/25/12     Blood Pressure    . Blood Pressure < 140/80  145/83 172/91 168/100 149/86 171/92     Result Component    . HEMOGLOBIN A1C < 7.0          . LDL CALC < 100            Progress Toward Treatment Goals:  Treatment Goal 12/15/2012  Hemoglobin A1C at goal  Blood pressure improved    Self Care Goals & Plans:  Self Care Goal 11/17/2012  Manage my medications take my medicines as prescribed  Monitor my health bring my glucose meter and log to each visit  Be physically active find an activity I enjoy  Meeting treatment goals maintain the current self-care plan    Home Blood Glucose Monitoring 12/15/2012  Check my blood sugar no home glucose monitoring     Care Management & Community Referrals:  Referral 12/15/2012  Referrals made for care management support none needed

## 2012-12-16 DIAGNOSIS — M25569 Pain in unspecified knee: Secondary | ICD-10-CM | POA: Diagnosis not present

## 2012-12-16 DIAGNOSIS — Z96659 Presence of unspecified artificial knee joint: Secondary | ICD-10-CM | POA: Diagnosis not present

## 2012-12-17 DIAGNOSIS — Z96659 Presence of unspecified artificial knee joint: Secondary | ICD-10-CM | POA: Diagnosis not present

## 2012-12-17 DIAGNOSIS — M25569 Pain in unspecified knee: Secondary | ICD-10-CM | POA: Diagnosis not present

## 2012-12-17 NOTE — Progress Notes (Signed)
Case discussed with Dr. Sharda soon after the resident saw the patient.  We reviewed the resident's history and exam and pertinent patient test results.  I agree with the assessment, diagnosis, and plan of care documented in the resident's note. 

## 2012-12-21 DIAGNOSIS — Z96659 Presence of unspecified artificial knee joint: Secondary | ICD-10-CM | POA: Diagnosis not present

## 2012-12-21 DIAGNOSIS — M25569 Pain in unspecified knee: Secondary | ICD-10-CM | POA: Diagnosis not present

## 2012-12-22 NOTE — Addendum Note (Signed)
Addended by: Belia Heman on: 12/22/2012 05:37 PM   Modules accepted: Orders

## 2012-12-23 DIAGNOSIS — Z96659 Presence of unspecified artificial knee joint: Secondary | ICD-10-CM | POA: Diagnosis not present

## 2012-12-23 DIAGNOSIS — M25569 Pain in unspecified knee: Secondary | ICD-10-CM | POA: Diagnosis not present

## 2012-12-28 DIAGNOSIS — Z96659 Presence of unspecified artificial knee joint: Secondary | ICD-10-CM | POA: Diagnosis not present

## 2012-12-28 DIAGNOSIS — M25569 Pain in unspecified knee: Secondary | ICD-10-CM | POA: Diagnosis not present

## 2012-12-30 DIAGNOSIS — M25569 Pain in unspecified knee: Secondary | ICD-10-CM | POA: Diagnosis not present

## 2012-12-30 DIAGNOSIS — Z96659 Presence of unspecified artificial knee joint: Secondary | ICD-10-CM | POA: Diagnosis not present

## 2013-02-03 DIAGNOSIS — M25569 Pain in unspecified knee: Secondary | ICD-10-CM | POA: Diagnosis not present

## 2013-02-16 ENCOUNTER — Ambulatory Visit (INDEPENDENT_AMBULATORY_CARE_PROVIDER_SITE_OTHER): Payer: Medicare Other | Admitting: Internal Medicine

## 2013-02-16 ENCOUNTER — Encounter: Payer: Self-pay | Admitting: Internal Medicine

## 2013-02-16 VITALS — BP 137/86 | HR 80 | Temp 97.9°F | Ht 59.0 in | Wt 152.6 lb

## 2013-02-16 DIAGNOSIS — M79621 Pain in right upper arm: Secondary | ICD-10-CM

## 2013-02-16 DIAGNOSIS — I10 Essential (primary) hypertension: Secondary | ICD-10-CM

## 2013-02-16 DIAGNOSIS — Z0000 Encounter for general adult medical examination without abnormal findings: Secondary | ICD-10-CM | POA: Diagnosis not present

## 2013-02-16 DIAGNOSIS — E1169 Type 2 diabetes mellitus with other specified complication: Secondary | ICD-10-CM

## 2013-02-16 DIAGNOSIS — M7989 Other specified soft tissue disorders: Secondary | ICD-10-CM

## 2013-02-16 DIAGNOSIS — M79609 Pain in unspecified limb: Secondary | ICD-10-CM | POA: Diagnosis not present

## 2013-02-16 DIAGNOSIS — M1711 Unilateral primary osteoarthritis, right knee: Secondary | ICD-10-CM

## 2013-02-16 DIAGNOSIS — E1159 Type 2 diabetes mellitus with other circulatory complications: Secondary | ICD-10-CM

## 2013-02-16 DIAGNOSIS — M1710 Unilateral primary osteoarthritis, unspecified knee: Secondary | ICD-10-CM

## 2013-02-16 DIAGNOSIS — I152 Hypertension secondary to endocrine disorders: Secondary | ICD-10-CM

## 2013-02-16 DIAGNOSIS — E119 Type 2 diabetes mellitus without complications: Secondary | ICD-10-CM

## 2013-02-16 DIAGNOSIS — Z23 Encounter for immunization: Secondary | ICD-10-CM

## 2013-02-16 LAB — GLUCOSE, CAPILLARY: Glucose-Capillary: 221 mg/dL — ABNORMAL HIGH (ref 70–99)

## 2013-02-16 LAB — POCT GLYCOSYLATED HEMOGLOBIN (HGB A1C): Hemoglobin A1C: 7.7

## 2013-02-16 NOTE — Assessment & Plan Note (Signed)
Lab Results  Component Value Date   HGBA1C 7.7 02/16/2013   HGBA1C 5.8 11/17/2012   HGBA1C 6.8* 08/16/2012     Assessment: Diabetes control: fair control Progress toward A1C goal:   deteriorated  Plan: Medications:  check renal function today, if Cr elevated, start glipizide, otherwise restart metformin Home glucose monitoring: Frequency: once a day Timing:  (as needed) Instruction/counseling given: reminded to get eye exam, reminded to bring blood glucose meter & log to each visit and reminded to bring medications to each visit Educational resources provided: brochure Self management tools provided: home glucose logbook

## 2013-02-16 NOTE — Assessment & Plan Note (Signed)
S/p right knee replacement complicated by septic joint  I suspect the ache in her right leg is due to deconditioning - she did well after PT but as PT was discontinued, I suspect the walk that she takes daily, though helpful, is not enough. I have asked that she try the PT maneuvers she was taught prior.  While DVT is in the differential, I suspect that this is less likely as she has not been bed rest, and ache/discomfort is of entire leg.  May consider doppler if no improvement.

## 2013-02-16 NOTE — Progress Notes (Signed)
Subjective:   Patient ID: [name] female   DOB: [birth year] 67 y.o.   MRN: [id number]  Chief Complaint  Patient presents with  . Diabetes    follow up  . Flu Vaccine  . Leg Pain    c/o right side leg pain, starts at hip adn radiates to foot. arthritis?   HPI: [name] is a 67 y.o. Marland Kitchen woman with history of diabetes, hypertension, hyperlipidemia, and breast cancer on anastrazole who presents today for routine DM & HTN follow up. Doxy until Dec after septic arthritis to prosthetic joint, followed by ID I last saw patient on 12/15/12 for HTN follow up  Feels like she is doing okay except ache in right leg - started during PT after septic joint (about 3 months ago) then stopped and became intermittent, but since the weather became cool, started happening on a regular basis. Location: top of foot, intensify at ankle, up to calf and up in to hip. Better with vicodin (by Dr. Turner Daniels, takes BID), hasn't tried anything else. Worse with driving.  No difference with walking - trying to walk daily (end of street).  Has noticed some swelling because right shoe is tighter than left.  Described as 4-5/10, enough to annoy. Never before. No new chills or fevers.   RUE pain at elbow (lateral) started about 1 month prior, cannot tell what makes it better or worse, intermittent/random, feels like sharp shooting pain from hand, little ache enough to get attention, feels swollen , feels different than leg.   Not currently on DM meds - checks CBGs sometimes, when she has a HA, but this hasn't happened recently, has seen up to 200s a time or two. Appetite improving Breakfast: cereal, bacon Lunch: sometimes skips, sometimes seafood or hotdog Dinner: pintos, string beans, corn on cob  Yest had to use phenergan due to nausea (antibiotic induced).   Plans to walk into [facility] where [facility] is   Review of Systems: Constitutional: Denies fever, chills, and fatigue.  HEENT: Denies  photophobia, eye pain, redness, hearing loss, ear pain, congestion, sore throat, rhinorrhea, sneezing, mouth sores, trouble swallowing, neck pain, neck stiffness  Respiratory: Denies SOB, DOE, cough, chest tightness, and wheezing.  Cardiovascular: Denies chest pain, palpitations and leg swelling.  Gastrointestinal: Denies vomiting, abdominal pain, diarrhea, blood in stool and abdominal distention.  Genitourinary: Denies dysuria, urgency, frequency, hematuria, flank pain and difficulty urinating.  Skin: Denies pallor and wound.  Neurological: Denies dizziness, seizures, syncope, weakness, lightheadedness, numbness and headaches.  Psychiatric/Behavioral: Denies suicidal ideation, mood changes, confusion, nervousness, sleep disturbance and agitation   Past Medical History  Diagnosis Date  . Allergy   . Hypertension   . Hyperlipidemia   . Fatty liver disease, nonalcoholic   . History of hyperkalemia   . Obesity   . Renal lesion   . Hx antineoplastic chemo  10/12 - 06/24/11    PACLITAX EL COMPLETED 06/24/11  . Anemia   . Diabetes mellitus ORAL MED  . OA (osteoarthritis) of knee     RIGHT LEG  . Mild nonproliferative diabetic retinopathy of right eye 03/19/11    Dr. Norva Pavlov  . Breast CA     (Rt) breast ca dx 4/12---  S/P RADICAL MASTECTOMY AND CHEMORADIATION  . Numbness and tingling     Hx: of in fingers and toes since chemotherapy   Current Outpatient Prescriptions  Medication Sig Dispense Refill  . anastrozole (ARIMIDEX) 1 MG tablet TAKE ONE TABLET BY MOUTH DAILY  30 tablet  3  . atorvastatin (LIPITOR) 10 MG tablet Take 1 tablet (10 mg total) by mouth at bedtime.  90 tablet  3  . doxycycline (VIBRA-TABS) 100 MG tablet Take 1 tablet (100 mg total) by mouth 2 (two) times daily.  60 tablet  2  . hydrochlorothiazide (HYDRODIURIL) 25 MG tablet Take 25 mg by mouth once.      Marland Kitchen HYDROmorphone (DILAUDID) 2 MG tablet Take 2 mg by mouth every 8 (eight) hours.      Marland Kitchen lisinopril  (PRINIVIL,ZESTRIL) 40 MG tablet Take 1 tablet (40 mg total) by mouth daily.  30 tablet  3  . methocarbamol (ROBAXIN) 500 MG tablet Take 1 tablet (500 mg total) by mouth every 6 (six) hours as needed.  60 tablet  0  . metoprolol succinate (TOPROL-XL) 50 MG 24 hr tablet Take 50 mg by mouth 2 (two) times daily. Take with or immediately following a meal.      . Polyvinyl Alcohol-Povidone (REFRESH OP) Place 1 drop into both eyes daily as needed. Dry eyes      . promethazine (PHENERGAN) 25 MG tablet Take 25 mg by mouth every 6 (six) hours as needed for nausea.       No current facility-administered medications for this visit.   Family History  Problem Relation Age of Onset  . Stroke Father   . Heart disease Sister   . Cancer Sister     breast  . Heart disease Brother   . Cancer Sister     COLON CANCER   History   Social History  . Marital Status: Married    Spouse Name: N/A    Number of Children: N/A  . Years of Education: N/A   Social History Main Topics  . Smoking status: Never Smoker   . Smokeless tobacco: Never Used  . Alcohol Use: No  . Drug Use: No  . Sexual Activity: Yes    Birth Control/ Protection: Surgical     Comment: MENARCHE AGE 89, G4, P4, PARITY AGE 81, HRT 20+ YEARS     Other Topics Concern  . 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  Patient needs to submit further paperwork to complete- Rudell Cobb 08/14/09.   Financial assistance application completed.  Patient does not qualify for assistance-over income Rudell Cobb 09/12/09.             Objective:  Physical Exam: Filed Vitals:   02/16/13 1316 02/16/13 1353  BP: 137/86   Pulse: 117 80  Temp: 97.9 F (36.6 C)   TempSrc: Oral   Height: 4\' 11"  (1.499 m)   Weight: 152 lb 9.6 oz (69.219 kg)   SpO2: 99%     General: appears as stated  age HEENT: PERRL, EOMI, no scleral icterus Cardiac: tachycardic, recheck RRR, no rubs, murmurs or gallops Pulm: clear to auscultation bilaterally, moving normal volumes of air Abd: soft, nontender, nondistended, BS normoactive Ext: warm and well perfused, no calf asymmetry, no focal erythema or edema of RLE, good ROM of knee (with palpable crepitus) and ankle; RUE with no frank edema or erythema, fatty tissue palpable bilaterally, but patient reports occasional swelling of right flexor surface swelling above elbow Neuro: alert and oriented X3, cranial nerves II-XII grossly intact  Assessment & Plan:  Case  and care discussed with Dr. Criselda Peaches.  Please see problem oriented charting for further details. Patient to return in 3 months for DM follow up, sooner if needed for right leg ache.  BMET today

## 2013-02-16 NOTE — Patient Instructions (Signed)
General Instructions: -Regarding your right arm pain, please get an ultrasound so we can make sure there is no blood clot since you are taking anastrazole  -Regarding your right leg pain, continue physical therapy exercises to strengthen your muscles, if this gets worse or does not improve, please let us know  -Regarding your diabetes, I will call you to figure out which medication we will restart once I get the results of your blood test (keeping an eye on your kidney function)  -Please have your eye exam sent to [place]  -You are getting your flu shot today  Please be sure to bring all of your medications with you to every visit.  Should you have any new or worsening symptoms, please be sure to call the clinic at [phone].   Treatment Goals:  Goals (1 Years of Data) as of 02/16/13         As of Today 12/15/12 12/06/12 12/03/12 11/29/12     Blood Pressure    . Blood Pressure < 140/80  137/86 145/83 172/91 168/100 149/86     Result Component    . HEMOGLOBIN A1C < 7.0  7.7        . LDL CALC < 100            Progress Toward Treatment Goals:  Treatment Goal 02/16/2013  Hemoglobin A1C -  Blood pressure at goal    Self Care Goals & Plans:  Self Care Goal 02/16/2013  Manage my medications take my medicines as prescribed; refill my medications on time  Monitor my health keep track of my blood glucose; keep track of my blood pressure  Eat healthy foods eat foods that are low in salt; eat baked foods instead of fried foods; drink diet soda or water instead of juice or soda  Be physically active find an activity I enjoy; take a walk every day  Meeting treatment goals maintain the current self-care plan    Home Blood Glucose Monitoring 02/16/2013  Check my blood sugar once a day  When to check my blood sugar (No Data)     Care Management & Community Referrals:  Referral 12/15/2012  Referrals made for care management support none needed

## 2013-02-17 ENCOUNTER — Telehealth: Payer: Self-pay | Admitting: *Deleted

## 2013-02-17 ENCOUNTER — Other Ambulatory Visit (INDEPENDENT_AMBULATORY_CARE_PROVIDER_SITE_OTHER): Payer: Medicare Other

## 2013-02-17 ENCOUNTER — Ambulatory Visit (HOSPITAL_COMMUNITY)
Admission: RE | Admit: 2013-02-17 | Discharge: 2013-02-17 | Disposition: A | Discharge status: Home / Self Care | Payer: Medicare Other | Source: Ambulatory Visit | Attending: Internal Medicine | Admitting: Internal Medicine

## 2013-02-17 DIAGNOSIS — N179 Acute kidney failure, unspecified: Secondary | ICD-10-CM

## 2013-02-17 DIAGNOSIS — I10 Essential (primary) hypertension: Secondary | ICD-10-CM

## 2013-02-17 DIAGNOSIS — M7989 Other specified soft tissue disorders: Secondary | ICD-10-CM | POA: Insufficient documentation

## 2013-02-17 DIAGNOSIS — M79621 Pain in right upper arm: Secondary | ICD-10-CM | POA: Insufficient documentation

## 2013-02-17 LAB — BASIC METABOLIC PANEL
BUN: 27 mg/dL — ABNORMAL HIGH (ref 6–23)
CO2: 29 mEq/L (ref 19–32)
Calcium: 10 mg/dL (ref 8.4–10.5)
Chloride: 99 mEq/L (ref 96–112)
Creat: 1.27 mg/dL — ABNORMAL HIGH (ref 0.50–1.10)
Glucose, Bld: 203 mg/dL — ABNORMAL HIGH (ref 70–99)
Potassium: 4 mEq/L (ref 3.5–5.3)
Sodium: 138 mEq/L (ref 135–145)

## 2013-02-17 NOTE — Progress Notes (Signed)
*  Preliminary Results* Right upper extremity venous duplex completed. Right upper extremity is negative for deep and superficial vein thrombosis.  02/17/2013 11:38 AM  Gertie Fey, RVT, RDCS, RDMS

## 2013-02-17 NOTE — Addendum Note (Signed)
Addended by: Debe Coder B on: 02/17/2013 03:01 PM   Modules accepted: Level of Service

## 2013-02-17 NOTE — Progress Notes (Signed)
Case discussed with Dr. Sharda soon after the resident saw the patient.  We reviewed the resident's history and exam and pertinent patient test results.  I agree with the assessment, diagnosis, and plan of care documented in the resident's note. 

## 2013-02-17 NOTE — Assessment & Plan Note (Signed)
BP Readings from Last 3 Encounters:  02/16/13 137/86  12/15/12 145/83  12/06/12 172/91    Lab Results  Component Value Date   NA 138 12/15/2012   K 3.7 12/15/2012   CREATININE 1.36* 12/15/2012    Assessment: Blood pressure control: controlled Progress toward BP goal:  at goal  Plan: Medications:  continue current medications - lisinopril 40, metoprolol XL 50, HCTZ 25 Educational resources provided: brochure Self management tools provided: home blood pressure logbook Other plans: Bmet today

## 2013-02-17 NOTE — Assessment & Plan Note (Signed)
May be musculoskeletal due to tendinopathy, but she reports intermittent swelling and characterizes pain differently than anything she has had before.  Given that she is on anastrazole, will check doppler to r/o DVT.

## 2013-02-17 NOTE — Telephone Encounter (Signed)
Call from [facility] She reports results Negative for DVT of right arm.

## 2013-02-17 NOTE — Assessment & Plan Note (Signed)
Flu shot today 

## 2013-02-23 ENCOUNTER — Encounter: Payer: Medicare Other | Admitting: Internal Medicine

## 2013-02-25 ENCOUNTER — Encounter: Payer: Self-pay | Admitting: Internal Medicine

## 2013-02-25 ENCOUNTER — Ambulatory Visit (INDEPENDENT_AMBULATORY_CARE_PROVIDER_SITE_OTHER): Payer: Medicare Other | Admitting: Internal Medicine

## 2013-02-25 ENCOUNTER — Other Ambulatory Visit (HOSPITAL_COMMUNITY)
Admission: RE | Admit: 2013-02-25 | Discharge: 2013-02-25 | Disposition: A | Discharge status: Home / Self Care | Payer: Medicare Other | Source: Ambulatory Visit | Attending: Internal Medicine | Admitting: Internal Medicine

## 2013-02-25 VITALS — BP 153/83 | HR 83 | Temp 98.4°F | Ht 59.0 in | Wt 155.5 lb

## 2013-02-25 DIAGNOSIS — E119 Type 2 diabetes mellitus without complications: Secondary | ICD-10-CM | POA: Diagnosis not present

## 2013-02-25 DIAGNOSIS — N9489 Other specified conditions associated with female genital organs and menstrual cycle: Secondary | ICD-10-CM

## 2013-02-25 DIAGNOSIS — N760 Acute vaginitis: Secondary | ICD-10-CM | POA: Diagnosis not present

## 2013-02-25 DIAGNOSIS — E1159 Type 2 diabetes mellitus with other circulatory complications: Secondary | ICD-10-CM

## 2013-02-25 DIAGNOSIS — I10 Essential (primary) hypertension: Secondary | ICD-10-CM

## 2013-02-25 DIAGNOSIS — I152 Hypertension secondary to endocrine disorders: Secondary | ICD-10-CM

## 2013-02-25 DIAGNOSIS — E1169 Type 2 diabetes mellitus with other specified complication: Secondary | ICD-10-CM

## 2013-02-25 DIAGNOSIS — N898 Other specified noninflammatory disorders of vagina: Secondary | ICD-10-CM

## 2013-02-25 DIAGNOSIS — N939 Abnormal uterine and vaginal bleeding, unspecified: Secondary | ICD-10-CM | POA: Insufficient documentation

## 2013-02-25 LAB — GLUCOSE, CAPILLARY: Glucose-Capillary: 169 mg/dL — ABNORMAL HIGH (ref 70–99)

## 2013-02-25 MED ORDER — ESTROGENS, CONJUGATED 0.625 MG/GM VA CREA: TOPICAL_CREAM | Freq: Every day | VAGINAL | Status: DC

## 2013-02-25 NOTE — Assessment & Plan Note (Addendum)
Patient with ongoing vaginal dryness. Despite the use of lubrication with intercourse, she still experiencing a burning sensation. She presents today after seeing blood, which I think is from her tissues being friable and the bleeding was caused by the insertion of the Monistat applicator for treatment of a presumed yeast infection. She's not having any more blood, and does not appear to have a yeast infection on exam. I did check a wet prep, and I'm going to start estrogen vaginal cream today. She's to call the clinic in if she notices any more bleeding, as she'll need to be reevaluated at that time. - Premarin vaginal cream apply 1-1/2 g daily.

## 2013-02-25 NOTE — Assessment & Plan Note (Signed)
BP Readings from Last 3 Encounters:  02/25/13 153/83  02/16/13 137/86  12/15/12 145/83    Lab Results  Component Value Date   NA 138 02/17/2013   K 4.0 02/17/2013   CREATININE 1.27* 02/17/2013    Assessment: Blood pressure control: mildly elevated Progress toward BP goal:  deteriorated Comments: She is only taking the lopressor qday instead of BID  Plan: Medications:  continue current medications and be sure to take the lopressor BID Educational resources provided: brochure Self management tools provided: home blood pressure logbook Other plans: F/u in January '15 for BP check

## 2013-02-25 NOTE — Patient Instructions (Signed)
**Apply the premarin cream to the vagina to help with vaginal dryness. This should also help with the pain with intercourse. If you continue to see bleeding after today, please call the clinic to be reevaluated.   Menopause Menopause is the normal time of life when menstrual periods stop completely. Menopause is complete when you have missed 12 consecutive menstrual periods. It usually occurs between the ages of 91 to 90, with an average age of 81. Very rarely does a woman develop menopause before 65 years old. At menopause, your ovaries stop producing the female hormones, estrogen and progesterone. This can cause undesirable symptoms and also affect your health. Sometimes the symptoms may occur 4 to 5 years before the menopause begins. There is no relationship between menopause and:  Oral contraceptives.  Number of children you had.  Race.  The age your menstrual periods started (menarche). Heavy smokers and very thin women may develop menopause earlier in life. CAUSES  The ovaries stop producing the female hormones estrogen and progesterone.  Other causes include:  Surgery to remove both ovaries.  The ovaries stop functioning for no known reason.  Tumors of the pituitary gland in the brain.  Medical disease that affects the ovaries and hormone production.  Radiation treatment to the abdomen or pelvis.  Chemotherapy that affects the ovaries. SYMPTOMS   Hot flashes.  Night sweats.  Decrease in sex drive.  Vaginal dryness and thinning of the vagina causing painful intercourse.  Dryness of the skin and developing wrinkles.  Headaches.  Tiredness.  Irritability.  Memory problems.  Weight gain.  Bladder infections.  Hair growth of the face and chest.  Infertility. More serious symptoms include:  Loss of bone (osteoporosis) causing breaks (fractures).  Depression.  Hardening and narrowing of the arteries (atherosclerosis) causing heart attacks and  strokes. DIAGNOSIS   When the menstrual periods have stopped for 12 straight months.  Physical exam.  Hormone studies of the blood. TREATMENT  There are many treatment choices and nearly as many questions about them. The decisions to treat or not to treat menopausal changes is an individual choice made with your caregiver. Your caregiver can discuss the treatments with you. Together, you can decide which treatment will work best for you. Your treatment choices may include:   Hormone therapy (estorgen and progesterone).  Non-hormonal medications.  Treating the individual symptoms with medication (for example antidepressants for depression).  Herbal medications that may help specific symptoms.  Counseling by a psychiatrist or psychologist.  Group therapy.  Lifestyle changes including:  Eating healthy.  Regular exercise.  Limiting caffeine and alcohol.  Stress management and meditation.  No treatment. HOME CARE INSTRUCTIONS   Take the medication your caregiver gives you as directed.  Get plenty of sleep and rest.  Exercise regularly.  Eat a diet that contains calcium (good for the bones) and soy products (acts like estrogen hormone).  Avoid alcoholic beverages.  Do not smoke.  If you have hot flashes, dress in layers.  Take supplements, calcium and vitamin D to strengthen bones.  You can use over-the-counter lubricants or moisturizers for vaginal dryness.  Group therapy is sometimes very helpful.  Acupuncture may be helpful in some cases. SEEK MEDICAL CARE IF:   You are not sure you are in menopause.  You are having menopausal symptoms and need advice and treatment.  You are still having menstrual periods after age 55.  You have pain with intercourse.  Menopause is complete (no menstrual period for 12 months) and you  develop vaginal bleeding.  You need a referral to a specialist (gynecologist, psychiatrist or psychologist) for treatment. SEEK  IMMEDIATE MEDICAL CARE IF:   You have severe depression.  You have excessive vaginal bleeding.  You fell and think you have a broken bone.  You have pain when you urinate.  You develop leg or chest pain.  You have a fast pounding heart beat (palpitations).  You have severe headaches.  You develop vision problems.  You feel a lump in your breast.  You have abdominal pain or severe indigestion. Document Released: 07/12/2003 Document Revised: 07/14/2011 Document Reviewed: 02/17/2008 [facility] Patient Information 2014 [place], [place].

## 2013-02-25 NOTE — Progress Notes (Signed)
Patient ID: [name], female   DOB: [birth year], 67 y.o.   MRN: [id number]  Subjective:   Patient ID: [name] female   DOB: 1946/04/22 67 y.o.   MRN: [id number]  HPI: [name] is a 67 y.o. F w/ PMH HLD, HTN, DM2, and is s/o R TKR with resulting infection, for which she is being treated with Doxycyline, presents today after seeing blood from her vagina.  She status post right knee replacement and this year, unfortunately it was complicated by infection. She's been on antibiotics, and is currently on doxycycline and who she sees in a box have caused her to have yeast infections. She recently used a Monistat vaginal suppository, and noticed blood when she removed the applicator. She states over the next day she began noticing more blood and became concerned, so she called the clinic for appointment. She states the blood has stopped, and she's not noticed any today.  She denies any vaginal burning or itching. She's monogamous with her husband, but does endorse some burning after sex. She states she's tried lubrication in the past which does seem to help somewhat, but she still experiences burning after sex. She was undergoing on a trip this weekend, and possibly into next week, and she is concerned about having intercourse.   Past Medical History  Diagnosis Date  . Allergy   . Hypertension   . Hyperlipidemia   . Fatty liver disease, nonalcoholic   . History of hyperkalemia   . Obesity   . Renal lesion   . Hx antineoplastic chemo  10/12 - 06/24/11    PACLITAX EL COMPLETED 06/24/11  . Anemia   . Diabetes mellitus ORAL MED  . OA (osteoarthritis) of knee     RIGHT LEG  . Mild nonproliferative diabetic retinopathy of right eye 03/19/11    Dr. Norva Pavlov  . Breast CA     (Rt) breast ca dx 4/12---  S/P RADICAL MASTECTOMY AND CHEMORADIATION  . Numbness and tingling     Hx: of in fingers and toes since chemotherapy   Current Outpatient Prescriptions  Medication Sig  Dispense Refill  . anastrozole (ARIMIDEX) 1 MG tablet TAKE ONE TABLET BY MOUTH DAILY  30 tablet  3  . atorvastatin (LIPITOR) 10 MG tablet Take 1 tablet (10 mg total) by mouth at bedtime.  90 tablet  3  . doxycycline (VIBRA-TABS) 100 MG tablet Take 1 tablet (100 mg total) by mouth 2 (two) times daily.  60 tablet  2  . hydrochlorothiazide (HYDRODIURIL) 25 MG tablet Take 25 mg by mouth once.      Marland Kitchen HYDROmorphone (DILAUDID) 2 MG tablet Take 2 mg by mouth every 8 (eight) hours.      Marland Kitchen lisinopril (PRINIVIL,ZESTRIL) 40 MG tablet Take 1 tablet (40 mg total) by mouth daily.  30 tablet  3  . methocarbamol (ROBAXIN) 500 MG tablet Take 1 tablet (500 mg total) by mouth every 6 (six) hours as needed.  60 tablet  0  . metoprolol succinate (TOPROL-XL) 50 MG 24 hr tablet Take 50 mg by mouth 2 (two) times daily. Take with or immediately following a meal.      . NORCO 5-325 MG per tablet Take 1 tablet by mouth 2 (two) times daily as needed.      . Polyvinyl Alcohol-Povidone (REFRESH OP) Place 1 drop into both eyes daily as needed. Dry eyes      . promethazine (PHENERGAN) 25 MG tablet Take 25 mg by mouth every 6 (six) hours  as needed for nausea.       No current facility-administered medications for this visit.   Family History  Problem Relation Age of Onset  . Stroke Father   . Heart disease Sister   . Cancer Sister     breast  . Heart disease Brother   . Cancer Sister     COLON CANCER   History   Social History  . Marital Status: Married    Spouse Name: N/A    Number of Children: N/A  . Years of Education: N/A   Social History Main Topics  . Smoking status: Never Smoker   . Smokeless tobacco: Never Used  . Alcohol Use: No  . Drug Use: No  . Sexual Activity: Yes    Birth Control/ Protection: Surgical     Comment: MENARCHE AGE 29, G4, P4, PARITY AGE 89, HRT 20+ YEARS     Other Topics Concern  . 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  Patient needs to submit further paperwork to complete- Rudell Cobb 08/14/09.   Financial assistance application completed.  Patient does not qualify for assistance-over income Rudell Cobb 09/12/09.            Review of Systems: Constitutional: Denies fever, chills HEENT: Denies eye pain, congestion, sore throat, neck pain Respiratory: Denies SOB, DOE, cough  Cardiovascular: Denies chest pain and leg swelling.  Gastrointestinal: Denies nausea, vomiting, abdominal pain, diarrhea, constipation, blood in stool and abdominal distention.  Genitourinary: +buring with urination after sex. +buring during and after sex.  Musculoskeletal: +right knee pain Skin: +infection of R knee s/p surgery  Neurological: Denies dizziness, syncope, weakness Psychiatric/Behavioral: Denies suicidal ideation, mood changes  Objective:  Physical Exam: Filed Vitals:   02/25/13 1318  BP: 153/83  Pulse: 83  Temp: 98.4 F (36.9 C)  TempSrc: Oral  Height: 4\' 11"  (1.499 m)  Weight: 155 lb 8 oz (70.534 kg)  SpO2: 100%   Constitutional: Vital signs reviewed.  Patient is a very pleasant, well-developed, and well-nourished female in no acute distress and cooperative with exam.  Head: Normocephalic and atraumatic Eyes: PERRL, EOMI, conjunctivae normal, No scleral icterus.  Neck: Supple, Trachea midline  Cardiovascular: RRR, no MRG Pulmonary/Chest: normal respiratory effort, CTAB, no wheezes, rales, or rhonchi Abdominal: Soft. Non-tender, non-distended GU: On vaginal exam, normal discharge noted, vaginal wall is dry and mildly friable. No significant erythema. Pain with insertion of speculum despite adequate lubrication. Musculoskeletal: R knee s/p surgery Neurological: A&O x3, nonfocal Skin: R knee s/p surgery  Psychiatric: Normal mood and affect. speech and behavior is normal.   Assessment & Plan:    Please refer to Problem List based Assessment and Plan

## 2013-02-25 NOTE — Assessment & Plan Note (Signed)
Lab Results  Component Value Date   HGBA1C 7.7 02/16/2013   HGBA1C 5.8 11/17/2012   HGBA1C 6.8* 08/16/2012     Assessment: Diabetes control: fair control Progress toward A1C goal:  unchanged   Plan: Medications:  continue current medications Home glucose monitoring: Frequency:   Timing:   Instruction/counseling given: reminded to get eye exam and reminded to bring medications to each visit - getting eye exam today Educational resources provided: brochure Self management tools provided:   Other plans: F/u in 1 mo for A1c recheck

## 2013-02-28 NOTE — Progress Notes (Signed)
Case discussed with Dr. Glenn at the time of the visit.  We reviewed the resident's history and exam and pertinent patient test results.  I agree with the assessment, diagnosis, and plan of care documented in the resident's note.   

## 2013-02-28 NOTE — Addendum Note (Signed)
Addended by: Debe Coder B on: 02/28/2013 11:21 AM   Modules accepted: Level of Service

## 2013-03-03 ENCOUNTER — Other Ambulatory Visit: Payer: Self-pay | Admitting: *Deleted

## 2013-03-03 DIAGNOSIS — E119 Type 2 diabetes mellitus without complications: Secondary | ICD-10-CM

## 2013-03-03 NOTE — Progress Notes (Signed)
Pt had retinal scan in our office on 02/25/13 "Image inadequate for assessment of retinal pathology", will make referral to ophthalmologist for diabetic eye exam.Jillian Mah Cassady10/30/20143:16 PM

## 2013-03-21 ENCOUNTER — Encounter: Payer: Self-pay | Admitting: Internal Medicine

## 2013-03-24 ENCOUNTER — Other Ambulatory Visit: Payer: Self-pay | Admitting: *Deleted

## 2013-03-25 MED ORDER — METOPROLOL SUCCINATE ER 50 MG PO TB24: 50.0000 mg | ORAL_TABLET | Freq: Two times a day (BID) | ORAL | Status: DC

## 2013-04-05 ENCOUNTER — Other Ambulatory Visit: Payer: Self-pay | Admitting: Oncology

## 2013-04-05 DIAGNOSIS — C50919 Malignant neoplasm of unspecified site of unspecified female breast: Secondary | ICD-10-CM

## 2013-04-12 NOTE — Progress Notes (Signed)
This encounter was created in error - please disregard.

## 2013-04-21 ENCOUNTER — Telehealth: Payer: Self-pay | Admitting: *Deleted

## 2013-04-21 NOTE — Telephone Encounter (Signed)
Pt called for refill on K[facility] and wants the liquid form that she used in rehab. I explained we did not have her on KCL and would need to see her before we gave her a Rx.   She is now out of rehab so those MD's will not refill. We don't have any openings until 12/23.  Will see then unless sooner opening.

## 2013-04-22 ENCOUNTER — Ambulatory Visit (INDEPENDENT_AMBULATORY_CARE_PROVIDER_SITE_OTHER): Payer: Medicare Other | Admitting: Internal Medicine

## 2013-04-22 ENCOUNTER — Encounter: Payer: Self-pay | Admitting: Internal Medicine

## 2013-04-22 VITALS — BP 146/82 | HR 81 | Temp 98.3°F | Ht 59.0 in | Wt 155.3 lb

## 2013-04-22 DIAGNOSIS — E119 Type 2 diabetes mellitus without complications: Secondary | ICD-10-CM | POA: Diagnosis not present

## 2013-04-22 DIAGNOSIS — Z760 Encounter for issue of repeat prescription: Secondary | ICD-10-CM

## 2013-04-22 DIAGNOSIS — I10 Essential (primary) hypertension: Secondary | ICD-10-CM | POA: Diagnosis not present

## 2013-04-22 DIAGNOSIS — E1169 Type 2 diabetes mellitus with other specified complication: Secondary | ICD-10-CM | POA: Diagnosis not present

## 2013-04-22 DIAGNOSIS — N9489 Other specified conditions associated with female genital organs and menstrual cycle: Secondary | ICD-10-CM | POA: Diagnosis not present

## 2013-04-22 LAB — GLUCOSE, CAPILLARY: Glucose-Capillary: 115 mg/dL — ABNORMAL HIGH (ref 70–99)

## 2013-04-22 MED ORDER — POTASSIUM CHLORIDE 20 MEQ/15ML (10%) PO LIQD: 40.0000 meq | Freq: Every day | ORAL | Status: DC

## 2013-04-22 NOTE — Patient Instructions (Signed)
It was a pleasure to meet you today.   Glad you have done well after your rehab and continue to take your Antibiotics as your doctor tells you a joint infection after surgery is very serious.   In terms of the potassium a prescription was sent to the pharmacy for the liquid since it is easier for you to take.

## 2013-04-22 NOTE — Progress Notes (Signed)
   Subjective:    Patient ID: [name], female    DOB: March 17, 1946, 67 y.o.   MRN: [id number]  HPI [name] is a 67 yo woman pmh of DM type 2, OA, who recently d/c from inpatient rehab p/w need for refill medication. Pt was in inpatient rehab for prostethic knee surgery and then subsequent infection. She is continuing on her antibiotics and states no compliance issues. She follows with with infectious disease for management of her Abx therapy and monitoring her progress. She continues with PT at home with no complaints.    Review of Systems  Constitutional: Positive for fatigue. Negative for fever, activity change and appetite change.  Respiratory: Negative for cough, chest tightness and shortness of breath.   Cardiovascular: Negative for chest pain, palpitations (has had a hx of in the past associated with hypokalemia) and leg swelling.  Gastrointestinal: Negative for nausea, vomiting, abdominal pain, diarrhea and constipation.  Musculoskeletal: Positive for arthralgias (not above baseline). Negative for joint swelling and myalgias.       Objective:   Physical Exam Filed Vitals:   04/22/13 1320  BP: 146/82  Pulse: 81  Temp: 98.3 F (36.8 C)   General: sitting in chair, NAD HEENT: PERRL, EOMI, no scleral icterus Cardiac: RRR, no rubs, murmurs or gallops Pulm: clear to auscultation bilaterally, moving normal volumes of air Abd: soft, nontender, nondistended, BS present Ext: warm and well perfused, no pedal edema Neuro: alert and oriented X3, cranial nerves II-XII grossly intact      Assessment & Plan:  1. Medication refill: Pt states that the inpatient doctors would not refill her medication given her completion and was unable to get into an appt with her PCP. Pt has had long standing hypokalemia for past 7 months that is being investigated by PCP.  -KCL liquid prescription  Pt discussed with Dr. Kem Ryan

## 2013-04-22 NOTE — Telephone Encounter (Signed)
Pt scheduled for today 12/19 at 1:15. Message left with pt.

## 2013-04-26 ENCOUNTER — Ambulatory Visit: Payer: Medicare Other | Admitting: Internal Medicine

## 2013-04-26 NOTE — Progress Notes (Signed)
Case discussed with Dr. Sadek at time of visit.  We reviewed the resident's history and exam and pertinent patient test results.  I agree with the assessment, diagnosis, and plan of care documented in the resident's note. 

## 2013-05-06 ENCOUNTER — Other Ambulatory Visit: Payer: Self-pay | Admitting: *Deleted

## 2013-05-06 DIAGNOSIS — J309 Allergic rhinitis, unspecified: Secondary | ICD-10-CM

## 2013-05-06 MED ORDER — FLUTICASONE PROPIONATE 50 MCG/ACT NA SUSP: 2.0000 | Freq: Every day | NASAL | Status: DC

## 2013-05-12 DIAGNOSIS — M25569 Pain in unspecified knee: Secondary | ICD-10-CM | POA: Diagnosis not present

## 2013-05-25 ENCOUNTER — Ambulatory Visit (INDEPENDENT_AMBULATORY_CARE_PROVIDER_SITE_OTHER): Payer: Medicare Other | Admitting: Internal Medicine

## 2013-05-25 ENCOUNTER — Encounter: Payer: Self-pay | Admitting: Internal Medicine

## 2013-05-25 VITALS — BP 132/71 | HR 85 | Temp 98.1°F | Ht 59.0 in | Wt 157.5 lb

## 2013-05-25 DIAGNOSIS — N898 Other specified noninflammatory disorders of vagina: Secondary | ICD-10-CM

## 2013-05-25 DIAGNOSIS — I152 Hypertension secondary to endocrine disorders: Secondary | ICD-10-CM

## 2013-05-25 DIAGNOSIS — N939 Abnormal uterine and vaginal bleeding, unspecified: Secondary | ICD-10-CM

## 2013-05-25 DIAGNOSIS — I10 Essential (primary) hypertension: Secondary | ICD-10-CM

## 2013-05-25 DIAGNOSIS — N9489 Other specified conditions associated with female genital organs and menstrual cycle: Secondary | ICD-10-CM | POA: Diagnosis not present

## 2013-05-25 DIAGNOSIS — E119 Type 2 diabetes mellitus without complications: Secondary | ICD-10-CM

## 2013-05-25 DIAGNOSIS — E1169 Type 2 diabetes mellitus with other specified complication: Secondary | ICD-10-CM | POA: Diagnosis not present

## 2013-05-25 DIAGNOSIS — E1159 Type 2 diabetes mellitus with other circulatory complications: Secondary | ICD-10-CM

## 2013-05-25 LAB — BASIC METABOLIC PANEL
BUN: 23 mg/dL (ref 6–23)
CO2: 30 mEq/L (ref 19–32)
Calcium: 9.9 mg/dL (ref 8.4–10.5)
Chloride: 98 mEq/L (ref 96–112)
Creat: 1.07 mg/dL (ref 0.50–1.10)
Glucose, Bld: 292 mg/dL — ABNORMAL HIGH (ref 70–99)
Potassium: 3.6 mEq/L (ref 3.5–5.3)
Sodium: 140 mEq/L (ref 135–145)

## 2013-05-25 LAB — POCT GLYCOSYLATED HEMOGLOBIN (HGB A1C): Hemoglobin A1C: 7.2

## 2013-05-25 LAB — GLUCOSE, CAPILLARY: Glucose-Capillary: 264 mg/dL — ABNORMAL HIGH (ref 70–99)

## 2013-05-25 MED ORDER — HYDROCHLOROTHIAZIDE 25 MG PO TABS: 25.0000 mg | ORAL_TABLET | Freq: Every day | ORAL | Status: DC

## 2013-05-25 MED ORDER — GLIPIZIDE 5 MG PO TABS: 5.0000 mg | ORAL_TABLET | Freq: Every day | ORAL | Status: DC

## 2013-05-25 NOTE — Progress Notes (Signed)
Subjective:   Patient ID: [name] female   DOB: [birth year] 68 y.o.   MRN: [id number]  Chief Complaint  Patient presents with  . Follow-up    Doing well.  . Medication Refill    HPI: [name] is a 68 y.o. woman with history of diabetes, hypertension, hyperlipidemia, and breast cancer on anastrazole who presents today for routine DM & HTN follow up.  Dr. Mayer Camel of ortho has kept [name] on chronic abx therapy - duration indefinite.   Has used premarin cream a couple times, has not noticed any further vaginal bleeding, was always after sex.   No s/s of hypoglycemia (such as dizziness, lightheadednes, diaphoresis)  Needs refills: HCTZ 25  Review of Systems: Constitutional: Denies fever, chills, diaphoresis, appetite change (good appetite) and fatigue (reports good energy, has been too cold to walk) HEENT: Denies photophobia, eye pain, redness, hearing loss, ear pain, congestion, sore throat, rhinorrhea, sneezing, mouth sores, trouble swallowing, neck pain, neck stiffness and tinnitus. Wants ears checked, intermittently hurts (forgot to tell MD last time she was here), hasn't noticed discharge, but did scratch it the other day and started bleeding. No Q-tips, but she does use hair pins. Respiratory: Denies SOB, DOE, cough, chest tightness, and wheezing.  Cardiovascular: Denies chest pain, palpitations and leg swelling.  Gastrointestinal: Denies nausea, vomiting (will regurg certain types of foods - unable to classify type of food), abdominal pain, diarrhea, constipation,blood in stool and abdominal distention.weight stable/weight gain Genitourinary: Denies dysuria, urgency, frequency, hematuria, flank pain and difficulty urinating.  Musculoskeletal: occ right knee pain Skin: Denies pallor, rash and wound.  Neurological: Denies dizziness, seizures, syncope, weakness, lightheadedness, numbness and headaches.    Past Medical History  Diagnosis Date  . Allergy   .  Hypertension   . Hyperlipidemia   . Fatty liver disease, nonalcoholic   . History of hyperkalemia   . Obesity   . Renal lesion   . Hx antineoplastic chemo  10/12 - 06/24/11    PACLITAX EL COMPLETED 06/24/11  . Anemia   . Diabetes mellitus ORAL MED  . OA (osteoarthritis) of knee     RIGHT LEG  . Mild nonproliferative diabetic retinopathy of right eye 03/19/11    Dr. Joseph Art  . Breast CA     (Rt) breast ca dx 4/12---  S/P RADICAL MASTECTOMY AND CHEMORADIATION  . Numbness and tingling     Hx: of in fingers and toes since chemotherapy   Current Outpatient Prescriptions  Medication Sig Dispense Refill  . anastrozole (ARIMIDEX) 1 MG tablet TAKE 1 TABLET BY MOUTH DAILY  30 tablet  8  . atorvastatin (LIPITOR) 10 MG tablet Take 1 tablet (10 mg total) by mouth at bedtime.  90 tablet  3  . conjugated estrogens (PREMARIN) vaginal cream Place vaginally daily. 1-1.5 gram(s) in the vagina daily  42.5 g  12  . doxycycline (VIBRA-TABS) 100 MG tablet Take 1 tablet (100 mg total) by mouth 2 (two) times daily.  60 tablet  2  . fluticasone (FLONASE) 50 MCG/ACT nasal spray Place 2 sprays into both nostrils daily.  16 g  2  . hydrochlorothiazide (HYDRODIURIL) 25 MG tablet Take 25 mg by mouth once.      Marland Kitchen HYDROmorphone (DILAUDID) 2 MG tablet Take 2 mg by mouth every 8 (eight) hours.      Marland Kitchen lisinopril (PRINIVIL,ZESTRIL) 40 MG tablet Take 1 tablet (40 mg total) by mouth daily.  30 tablet  3  . methocarbamol (ROBAXIN) 500 MG  tablet Take 1 tablet (500 mg total) by mouth every 6 (six) hours as needed.  60 tablet  0  . metoprolol succinate (TOPROL-XL) 50 MG 24 hr tablet Take 1 tablet (50 mg total) by mouth 2 (two) times daily. Take with or immediately following a meal.  90 tablet  2  . NORCO 5-325 MG per tablet Take 1 tablet by mouth 2 (two) times daily as needed.      . Polyvinyl Alcohol-Povidone (REFRESH OP) Place 1 drop into both eyes daily as needed. Dry eyes      . potassium chloride 20 MEQ/15ML (10%)  solution Take 30 mLs (40 mEq total) by mouth daily.  500 mL  0  . promethazine (PHENERGAN) 25 MG tablet Take 25 mg by mouth every 6 (six) hours as needed for nausea.       No current facility-administered medications for this visit.   Family History  Problem Relation Age of Onset  . Stroke Father   . Heart disease Sister   . Cancer Sister     breast  . Heart disease Brother   . Cancer Sister     COLON CANCER   History   Social History  . Marital Status: Married    Spouse Name: N/A    Number of Children: N/A  . Years of Education: 23   Social History Main Topics  . Smoking status: Never Smoker   . Smokeless tobacco: Never Used  . Alcohol Use: No  . Drug Use: No  . Sexual Activity: Yes    Birth Control/ Protection: Surgical     Comment: MENARCHE AGE 36, G4, P4, PARITY AGE 67, HRT 20+ YEARS     Other Topics Concern  . 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  Patient needs to submit further paperwork to complete- Bonna Gains 08/14/09.   Financial assistance application completed.  Patient does not qualify for [place] 09/12/09.             Objective:  Physical Exam: Filed Vitals:   05/25/13 1345 05/25/13 1346  BP:  132/71  Pulse:  85  Temp:  98.1 F (36.7 C)  TempSrc:  Oral  Height: 4\' 11"  (1.499 m)   Weight: 157 lb 8 oz (71.442 kg)   SpO2:  99%   General: pleasant, NAD HEENT: PERRL, EOMI, no scleral icterus, TM clear b/l with minimal dry blood in the right ear canal Cardiac: RRR, no rubs, murmurs or gallops Pulm: clear to auscultation bilaterally, moving normal volumes of air Abd: soft, nontender, nondistended, BS present Ext: warm and well perfused, no pedal edema Neuro: alert and oriented X3, cranial nerves II-XII grossly intact  Assessment & Plan:  Case and care  discussed with Dr. Lynnae January.  Please see problem oriented charting for further details. Patient to return in 3 months for routine DM & HTN follow up.

## 2013-05-25 NOTE — Assessment & Plan Note (Addendum)
Though patient is without vaginal bleeding now, this is because she has abstained from sexual activity.  While vaginal atrophy is the likely etiology, given her h/o breast Ca and being on anastrazole, I will order a transvaginal US to r/o endometrial ca

## 2013-05-25 NOTE — Assessment & Plan Note (Signed)
Lab Results  Component Value Date   HGBA1C 7.2 05/25/2013   HGBA1C 7.7 02/16/2013   HGBA1C 5.8 11/17/2012     Assessment: Diabetes control: fair control Progress toward A1C goal:  improved  Plan: Medications:  will start glipizide 5mg  daily given improving appetite, avoiding metformin given fluctuating renal function Home glucose monitoring: Frequency: no home glucose monitoring (as needed) Timing:   Instruction/counseling given: reminded to bring blood glucose meter & log to each visit and reminded to bring medications to each visit Educational resources provided: brochure;handout Self management tools provided: copy of home glucose meter download

## 2013-05-25 NOTE — Assessment & Plan Note (Signed)
BP Readings from Last 3 Encounters:  05/25/13 132/71  04/22/13 146/82  02/25/13 153/83    Lab Results  Component Value Date   NA 138 02/17/2013   K 4.0 02/17/2013   CREATININE 1.27* 02/17/2013    Assessment: Blood pressure control: controlled Progress toward BP goal:  at goal  Plan: Medications:  continue current medications - refilled hctz 25, con lisinopril 40 Educational resources provided: Therapist, sports tools provided:   Other plans: BMET today

## 2013-05-25 NOTE — Patient Instructions (Addendum)
General Instructions:  -Regarding your blood pressure, I refilled hydrochlorothiazide today -Regarding your diabetes, we are going to start a medication called Glipizide 5mg  daily with breakfast.  If you have symptoms of low blood sugar like we discussed, check your sugar and eat something as soon as possible -Regarding your vaginal bleeding, I am ordering a transvaginal ultrasound to look at your uterus -I am checking your potassium and kidney function today  Please be sure to bring all of your medications with you to every visit.  Should you have any new or worsening symptoms, please be sure to call the clinic at [phone].   Treatment Goals:  Goals (1 Years of Data) as of 05/25/13         As of Today 04/22/13 02/25/13 02/16/13 12/15/12     Blood Pressure    . Blood Pressure < 140/80  132/71 146/82 153/83 137/86 145/83     Result Component    . HEMOGLOBIN A1C < 7.0  7.2   7.7     . LDL CALC < 100            Progress Toward Treatment Goals:  Treatment Goal 05/25/2013  Hemoglobin A1C deteriorated  Blood pressure at goal    Self Care Goals & Plans:  Self Care Goal 05/25/2013  Manage my medications take my medicines as prescribed; bring my medications to every visit; refill my medications on time; follow the sick day instructions if I am sick  Monitor my health keep track of my blood glucose; bring my glucose meter and log to each visit; keep track of my blood pressure; keep track of my weight; check my feet daily  Eat healthy foods eat more vegetables; eat fruit for snacks and desserts; eat foods that are low in salt; eat smaller portions  Be physically active find an activity I enjoy  Meeting treatment goals maintain the current self-care plan    Home Blood Glucose Monitoring 05/25/2013  Check my blood sugar no home glucose monitoring  When to check my blood sugar -     Care Management & Community Referrals:  Referral 12/15/2012  Referrals made for care management support  none needed

## 2013-05-26 NOTE — Progress Notes (Signed)
Message left on home phone ID recording [place] [facility] 05/27/13 8:30AM - full bladder - 32 oz water. Hilda Blades Minami Arriaga RN 05/26/13 11:30AM

## 2013-05-26 NOTE — Progress Notes (Signed)
Case discussed with Dr. Sharda soon after the resident saw the patient.  We reviewed the resident's history and exam and pertinent patient test results.  I agree with the assessment, diagnosis, and plan of care documented in the resident's note. 

## 2013-05-27 ENCOUNTER — Other Ambulatory Visit: Payer: Self-pay | Admitting: Internal Medicine

## 2013-05-27 ENCOUNTER — Ambulatory Visit (HOSPITAL_COMMUNITY)
Admission: RE | Admit: 2013-05-27 | Discharge: 2013-05-27 | Disposition: A | Discharge status: Home / Self Care | Payer: Medicare Other | Source: Ambulatory Visit | Attending: Internal Medicine | Admitting: Internal Medicine

## 2013-05-27 DIAGNOSIS — Z90710 Acquired absence of both cervix and uterus: Secondary | ICD-10-CM | POA: Insufficient documentation

## 2013-05-27 DIAGNOSIS — Z853 Personal history of malignant neoplasm of breast: Secondary | ICD-10-CM | POA: Insufficient documentation

## 2013-05-27 DIAGNOSIS — N898 Other specified noninflammatory disorders of vagina: Secondary | ICD-10-CM | POA: Diagnosis not present

## 2013-05-27 DIAGNOSIS — N939 Abnormal uterine and vaginal bleeding, unspecified: Secondary | ICD-10-CM

## 2013-07-28 ENCOUNTER — Encounter: Payer: Self-pay | Admitting: Licensed Clinical Social Worker

## 2013-07-28 ENCOUNTER — Telehealth: Payer: Self-pay | Admitting: Licensed Clinical Social Worker

## 2013-07-28 NOTE — Telephone Encounter (Signed)
CSW placed call to Ms. Zwiefelhofer.  Pt was listed as a potential candidate for [facility] care management services.  CSW left message with brief statement regarding [facility] and will send brochure to patient.

## 2013-08-08 ENCOUNTER — Other Ambulatory Visit: Payer: Self-pay | Admitting: Internal Medicine

## 2013-09-02 ENCOUNTER — Ambulatory Visit (INDEPENDENT_AMBULATORY_CARE_PROVIDER_SITE_OTHER): Payer: Medicare Other | Admitting: Internal Medicine

## 2013-09-02 ENCOUNTER — Encounter: Payer: Self-pay | Admitting: Internal Medicine

## 2013-09-02 VITALS — BP 116/67 | HR 78 | Temp 97.0°F | Ht 59.0 in | Wt 167.4 lb

## 2013-09-02 DIAGNOSIS — Y849 Medical procedure, unspecified as the cause of abnormal reaction of the patient, or of later complication, without mention of misadventure at the time of the procedure: Secondary | ICD-10-CM

## 2013-09-02 DIAGNOSIS — R209 Unspecified disturbances of skin sensation: Secondary | ICD-10-CM

## 2013-09-02 DIAGNOSIS — Z96659 Presence of unspecified artificial knee joint: Secondary | ICD-10-CM | POA: Diagnosis not present

## 2013-09-02 DIAGNOSIS — N898 Other specified noninflammatory disorders of vagina: Secondary | ICD-10-CM

## 2013-09-02 DIAGNOSIS — T8450XA Infection and inflammatory reaction due to unspecified internal joint prosthesis, initial encounter: Secondary | ICD-10-CM

## 2013-09-02 DIAGNOSIS — E119 Type 2 diabetes mellitus without complications: Secondary | ICD-10-CM

## 2013-09-02 DIAGNOSIS — E1169 Type 2 diabetes mellitus with other specified complication: Secondary | ICD-10-CM | POA: Diagnosis not present

## 2013-09-02 DIAGNOSIS — I10 Essential (primary) hypertension: Secondary | ICD-10-CM | POA: Diagnosis not present

## 2013-09-02 DIAGNOSIS — I152 Hypertension secondary to endocrine disorders: Secondary | ICD-10-CM

## 2013-09-02 DIAGNOSIS — E1159 Type 2 diabetes mellitus with other circulatory complications: Secondary | ICD-10-CM

## 2013-09-02 DIAGNOSIS — T8459XA Infection and inflammatory reaction due to other internal joint prosthesis, initial encounter: Secondary | ICD-10-CM

## 2013-09-02 DIAGNOSIS — R200 Anesthesia of skin: Secondary | ICD-10-CM | POA: Insufficient documentation

## 2013-09-02 DIAGNOSIS — N939 Abnormal uterine and vaginal bleeding, unspecified: Secondary | ICD-10-CM

## 2013-09-02 LAB — BASIC METABOLIC PANEL
BUN: 18 mg/dL (ref 6–23)
CO2: 28 mEq/L (ref 19–32)
Calcium: 9.4 mg/dL (ref 8.4–10.5)
Chloride: 100 mEq/L (ref 96–112)
Creat: 1.43 mg/dL — ABNORMAL HIGH (ref 0.50–1.10)
Glucose, Bld: 166 mg/dL — ABNORMAL HIGH (ref 70–99)
Potassium: 4.1 mEq/L (ref 3.5–5.3)
Sodium: 137 mEq/L (ref 135–145)

## 2013-09-02 LAB — GLUCOSE, CAPILLARY: Glucose-Capillary: 166 mg/dL — ABNORMAL HIGH (ref 70–99)

## 2013-09-02 LAB — POCT GLYCOSYLATED HEMOGLOBIN (HGB A1C): Hemoglobin A1C: 7.7

## 2013-09-02 MED ORDER — METFORMIN HCL 500 MG PO TABS
500.0000 mg | ORAL_TABLET | Freq: Two times a day (BID) | ORAL | Status: DC
Start: 2013-09-02 — End: 2014-05-11

## 2013-09-02 NOTE — Patient Instructions (Signed)
General Instructions:  - Let's restart metformin - start with 500mg  once daily for one week, then increase to twice daily.  If you experience diarrhea with twice daily, go back to just once daily.  - Regarding your hand tingling, try using hand braces, if no improvement let us know.    - Regarding your skin, go back to using regular dove and try not to use hot water.  Please be sure to bring all of your medications with you to every visit.  Should you have any new or worsening symptoms, please be sure to call the clinic at [phone].   Treatment Goals:  Goals (1 Years of Data) as of 09/02/13         As of Today 05/25/13 04/22/13 02/25/13 02/16/13     Blood Pressure    . Blood Pressure < 140/80  116/67 132/71 146/82 153/83 137/86     Result Component    . HEMOGLOBIN A1C < 7.0  7.7 7.2   7.7    . LDL CALC < 100            Progress Toward Treatment Goals:  Treatment Goal 09/02/2013  Hemoglobin A1C deteriorated  Blood pressure at goal    Self Care Goals & Plans:  Self Care Goal 09/02/2013  Manage my medications take my medicines as prescribed; bring my medications to every visit; refill my medications on time; follow the sick day instructions if I am sick  Monitor my health keep track of my blood glucose; keep track of my blood pressure; check my feet daily  Eat healthy foods eat more vegetables; eat fruit for snacks and desserts; eat foods that are low in salt; eat smaller portions; drink diet soda or water instead of juice or soda  Be physically active find an activity I enjoy  Meeting treatment goals -    Home Blood Glucose Monitoring 09/02/2013  Check my blood sugar once a day  When to check my blood sugar -     Care Management & Community Referrals:  Referral 12/15/2012  Referrals made for care management support none needed

## 2013-09-02 NOTE — Assessment & Plan Note (Signed)
Patient is s/p hysterectomy & b/l oopherectomy, [place] unrevealing. Vaginal bleeding resolved, sparingly uses premarin cream with intercourse.

## 2013-09-02 NOTE — Assessment & Plan Note (Addendum)
BP Readings from Last 3 Encounters:  09/02/13 116/67  05/25/13 132/71  04/22/13 146/82    Lab Results  Component Value Date   NA 140 05/25/2013   K 3.6 05/25/2013   CREATININE 1.07 05/25/2013    Assessment: Blood pressure control: controlled Progress toward BP goal:  at goal  Plan: Medications:  continue current medications - hctz 25, lisinopril 40, toprol xl 50 Educational resources provided: Therapist, sports tools provided:   Other plans: BMET today  ADDENDUM: Cr 1.07 --> 1.43 in setting of newly starting bactrim, which may falsely elevated Cr.  I have asked patient to come in for repeat blood work on Thursday May 7th.   ADDENDUM 09/15/13 Cr stable at 1.42, patient to cont ACEI

## 2013-09-02 NOTE — Progress Notes (Signed)
Case discussed with Dr. Sharda at the time of the visit.  We reviewed the resident's history and exam and pertinent patient test results.  I agree with the assessment, diagnosis, and plan of care documented in the resident's note.    

## 2013-09-02 NOTE — Progress Notes (Signed)
Subjective:   Patient ID: [name] female   DOB: 05-20-1945 68 y.o.   MRN: [id number]  HPI: [name] is a 68 y.o. woman with history of diabetes, hypertension, hyperlipidemia, and breast cancer on anastrazole who presents today for routine DM & HTN follow up.  At this point, she is going to be taking abx indef for right knee replacement infection   Numbness returned in b/l hands, better with shaking it off, similar to when she was doing chemo, but had gone away completely.  Usually in the fingers down to mid hand.  Increase use in hands. Started back about 2-3 months.  Scheduled to see Onc in June.   No further vaginal bleeding, using premarin cream.   Regarding DM, doing well. Taking glipizide + metformin. Good appetite. No low CBGs, checks CBGs randomly. Checks if something feels off.    Review of Systems: Constitutional: Denies fever, chills, diaphoresis, appetite change and fatigue.  HEENT: Denies photophobia, eye pain, redness, hearing loss, ear pain, congestion, sore throat, rhinorrhea, sneezing, mouth sores, trouble swallowing, neck pain, neck stiffness and tinnitus.  Respiratory: Denies SOB, DOE, cough, chest tightness, and wheezing.  Cardiovascular: Denies chest pain, palpitations and leg swelling.  Gastrointestinal: Denies nausea, vomiting, abdominal pain, diarrhea, constipation,blood in stool and abdominal distention.  Genitourinary: Denies dysuria, urgency, frequency, hematuria, flank pain and difficulty urinating.  Musculoskeletal: Denies myalgias, back pain, joint swelling, and gait problem.  Skin: rash on lower abdomen, had been using new soap  Past Medical History  Diagnosis Date  . Allergy   . Hypertension   . Hyperlipidemia   . Fatty liver disease, nonalcoholic   . History of hyperkalemia   . Obesity   . Renal lesion   . Hx antineoplastic chemo  10/12 - 06/24/11    PACLITAX EL COMPLETED 06/24/11  . Anemia   . Diabetes mellitus ORAL MED  . OA  (osteoarthritis) of knee     RIGHT LEG  . Mild nonproliferative diabetic retinopathy of right eye 03/19/11    Dr. Joseph Art  . Breast CA     (Rt) breast ca dx 4/12---  S/P RADICAL MASTECTOMY AND CHEMORADIATION  . Numbness and tingling     Hx: of in fingers and toes since chemotherapy   Current Outpatient Prescriptions  Medication Sig Dispense Refill  . anastrozole (ARIMIDEX) 1 MG tablet TAKE 1 TABLET BY MOUTH DAILY  30 tablet  8  . atorvastatin (LIPITOR) 10 MG tablet Take 1 tablet (10 mg total) by mouth at bedtime.  90 tablet  3  . conjugated estrogens (PREMARIN) vaginal cream Place vaginally daily. 1-1.5 gram(s) in the vagina daily  42.5 g  12  . doxycycline (VIBRA-TABS) 100 MG tablet Take 1 tablet (100 mg total) by mouth 2 (two) times daily.  60 tablet  2  . fluticasone (FLONASE) 50 MCG/ACT nasal spray Place 2 sprays into both nostrils daily.  16 g  2  . glipiZIDE (GLUCOTROL) 5 MG tablet Take 1 tablet (5 mg total) by mouth daily before breakfast.  30 tablet  3  . hydrochlorothiazide (HYDRODIURIL) 25 MG tablet Take 1 tablet (25 mg total) by mouth daily.  90 tablet  2  . lisinopril (PRINIVIL,ZESTRIL) 40 MG tablet Take 1 tablet (40 mg total) by mouth daily.  30 tablet  3  . metoprolol succinate (TOPROL-XL) 50 MG 24 hr tablet Take 1 tablet (50 mg total) by mouth 2 (two) times daily. Take with or immediately following a meal.  90 tablet  2  . NORCO 5-325 MG per tablet Take 1 tablet by mouth 2 (two) times daily as needed.      . Polyvinyl Alcohol-Povidone (REFRESH OP) Place 1 drop into both eyes daily as needed. Dry eyes      . potassium chloride 20 MEQ/15ML (10%) solution Take 30 mLs (40 mEq total) by mouth daily.  500 mL  0  . promethazine (PHENERGAN) 25 MG tablet Take 25 mg by mouth every 6 (six) hours as needed for nausea.       No current facility-administered medications for this visit.   Family History  Problem Relation Age of Onset  . Stroke Father   . Heart disease Sister   .  Cancer Sister     breast  . Heart disease Brother   . Cancer Sister     COLON CANCER   History   Social History  . Marital Status: Married    Spouse Name: N/A    Number of Children: N/A  . Years of Education: 3   Social History Main Topics  . Smoking status: Never Smoker   . Smokeless tobacco: Never Used  . Alcohol Use: No  . Drug Use: No  . Sexual Activity: Yes    Birth Control/ Protection: Surgical     Comment: MENARCHE AGE 44, G4, P4, PARITY AGE 16, HRT 20+ YEARS     Other Topics Concern  . 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  Patient needs to submit further paperwork to complete- Bonna Gains 08/14/09.   Financial assistance application completed.  Patient does not qualify for [place] 09/12/09.             Objective:  Physical Exam: Filed Vitals:   09/02/13 1058  BP: 116/67  Pulse: 78  Temp: 97 F (36.1 C)  TempSrc: Oral  Height: 4\' 11"  (1.499 m)  Weight: 167 lb 6.4 oz (75.932 kg)  SpO2: 98%   General: pleasant, NAD  HEENT: PERRL, EOMI, no scleral icterus Cardiac: RRR, no rubs, murmurs or gallops  Pulm: clear to auscultation bilaterally, moving normal volumes of air  Abd: soft, nontender, nondistended, BS normoactive Ext: warm and well perfused, trace pedal edema, right knee replacement scar  Neuro: alert and oriented X3, cranial nerves II-XII grossly intact  Assessment & Plan:  Case and care discussed with Dr. Ellwood Dense.  Please see problem oriented charting for further details. Patient to return in 3 months for routine DM & HTN follow up, sooner if needed.

## 2013-09-02 NOTE — Assessment & Plan Note (Signed)
On chronic abx indefinitely, I think doxy has been changed to bactrim, but she does not have meds with her

## 2013-09-02 NOTE — Assessment & Plan Note (Addendum)
Lab Results  Component Value Date   HGBA1C 7.7 09/02/2013   HGBA1C 7.2 05/25/2013   HGBA1C 7.7 02/16/2013     Assessment: Diabetes control: fair control Progress toward A1C goal:  deteriorated  Plan: Medications:  cont glipizide, restart metformin - instructed to start metformin 500mg  daily, increase to BID, but if unable to tolerate BID, back down to daily Home glucose monitoring: Frequency: once a day Timing:   Instruction/counseling given: reminded to bring blood glucose meter & log to each visit, reminded to bring medications to each visit, discussed the need for weight loss and discussed diet Educational resources provided: brochure;handout   ADDENDUM: Patient's Cr is 1.43, have asked patient to refrain from taking metformin until we have repeat labs (cut off 1.4 for females)  ADDENDUM (09/15/13): patient's Cr stable at 1.42.  Recent studies suggest that metformin may be used with GFR down to 30 - her calculated GFR (MDRD) is 45.  Will ask patient to restart metformin 500 bid (if able to tolerate);  Cr may also be falsely elevated in setting of recent initiation of bactrim.

## 2013-09-02 NOTE — Assessment & Plan Note (Signed)
Unclear if related to increased use of hands, ?carpel tunnel.  Possible sequelae s/o chemo, but tingling/numbness had resolved completely after chemo.  DM uncontrolled, but has not been as such for very long.    Suggest b/l wrist brace and monitor.

## 2013-09-06 ENCOUNTER — Other Ambulatory Visit: Payer: Self-pay | Admitting: *Deleted

## 2013-09-06 DIAGNOSIS — I152 Hypertension secondary to endocrine disorders: Secondary | ICD-10-CM

## 2013-09-06 DIAGNOSIS — I10 Essential (primary) hypertension: Principal | ICD-10-CM

## 2013-09-06 DIAGNOSIS — E1159 Type 2 diabetes mellitus with other circulatory complications: Secondary | ICD-10-CM

## 2013-09-06 NOTE — Addendum Note (Signed)
Addended by: Othella Boyer on: 09/06/2013 02:19 PM   Modules accepted: Orders

## 2013-09-08 ENCOUNTER — Other Ambulatory Visit (INDEPENDENT_AMBULATORY_CARE_PROVIDER_SITE_OTHER): Payer: Medicare Other

## 2013-09-08 DIAGNOSIS — E1169 Type 2 diabetes mellitus with other specified complication: Secondary | ICD-10-CM

## 2013-09-08 DIAGNOSIS — E1159 Type 2 diabetes mellitus with other circulatory complications: Secondary | ICD-10-CM

## 2013-09-08 DIAGNOSIS — I152 Hypertension secondary to endocrine disorders: Secondary | ICD-10-CM

## 2013-09-08 DIAGNOSIS — E119 Type 2 diabetes mellitus without complications: Secondary | ICD-10-CM

## 2013-09-08 DIAGNOSIS — I10 Essential (primary) hypertension: Secondary | ICD-10-CM | POA: Diagnosis not present

## 2013-09-08 LAB — BASIC METABOLIC PANEL
BUN: 20 mg/dL (ref 6–23)
CO2: 28 mEq/L (ref 19–32)
Calcium: 9.5 mg/dL (ref 8.4–10.5)
Chloride: 98 mEq/L (ref 96–112)
Creat: 1.42 mg/dL — ABNORMAL HIGH (ref 0.50–1.10)
Glucose, Bld: 170 mg/dL — ABNORMAL HIGH (ref 70–99)
Potassium: 4.1 mEq/L (ref 3.5–5.3)
Sodium: 137 mEq/L (ref 135–145)

## 2013-09-15 MED ORDER — LISINOPRIL 40 MG PO TABS: 40.0000 mg | ORAL_TABLET | Freq: Every day | ORAL | Status: DC

## 2013-10-26 IMAGING — XA IR FLUORO GUIDE CV LINE*L*
1 series · 2 of 2 positions shown · non-contrast
Comparison: none

CLINICAL DATA: Right knee infection

[Series 1: run · 2 of 2 slices shown]
[im 1/2]
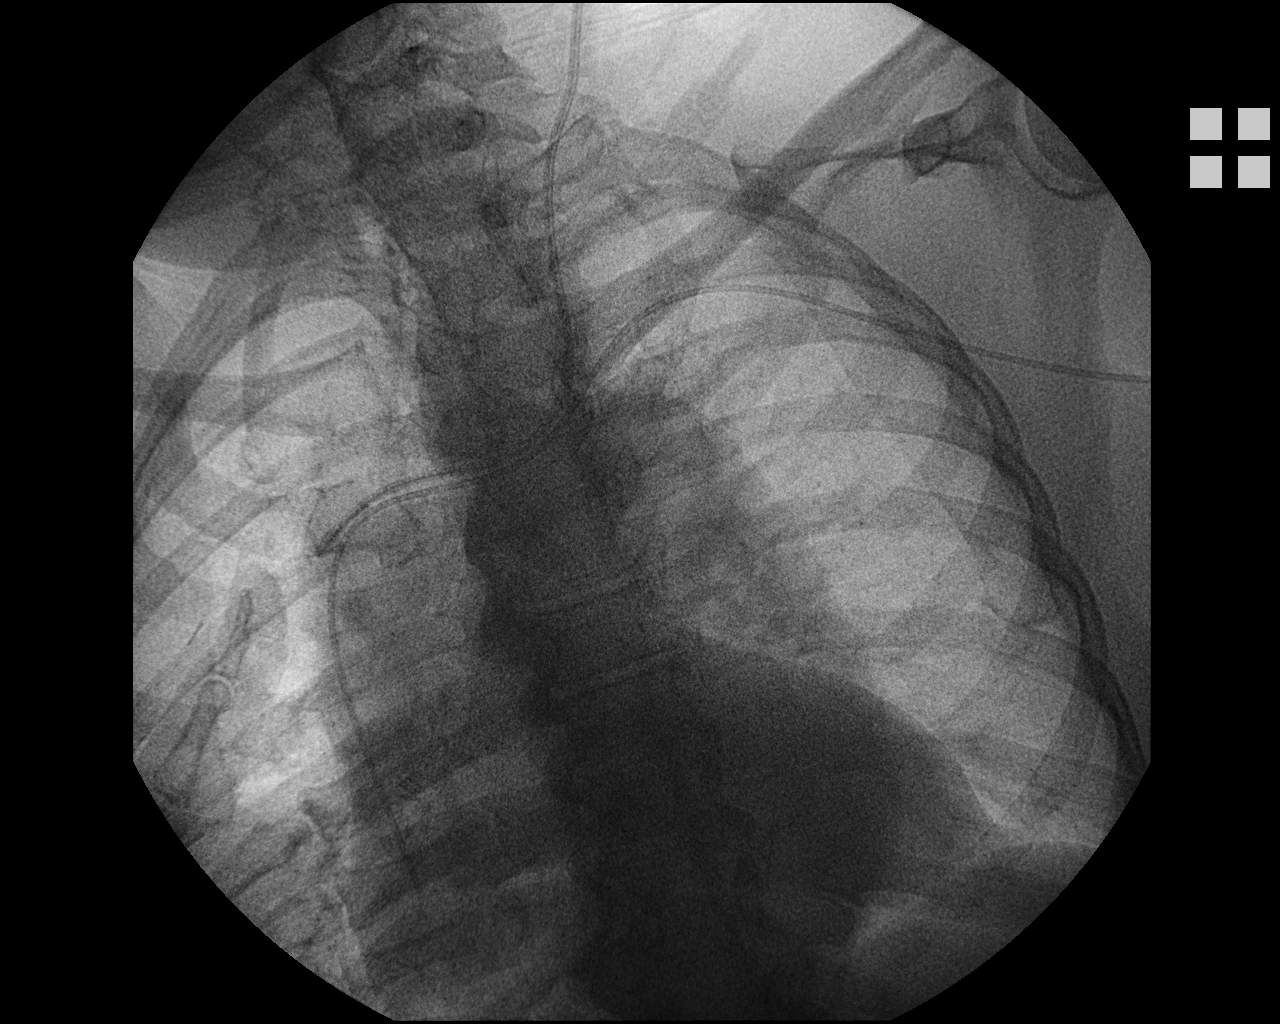
[im 2/2]
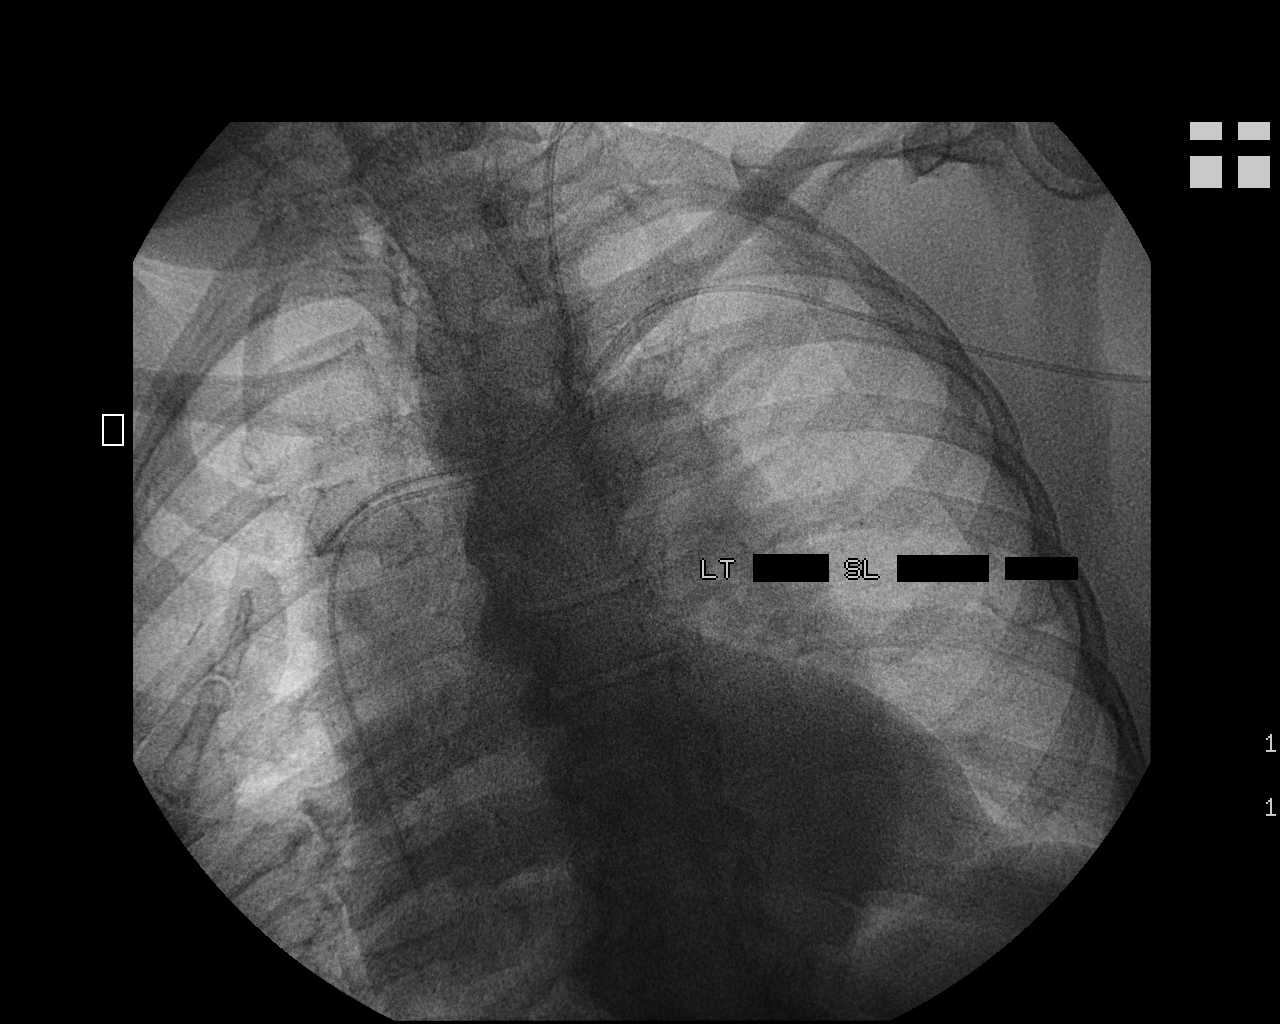

[2 of 2 positions shown; findings below may reference images not displayed]

PICC LINE PLACEMENT WITH ULTRASOUND AND FLUOROSCOPIC  GUIDANCE

Fluoroscopy Time: 42 seconds.

The left arm was prepped with chlorhexidine, draped in the usual
sterile fashion using maximum barrier technique (cap and mask,
sterile gown, sterile gloves, large sterile sheet, hand hygiene and
cutaneous antisepsis) and infiltrated locally with 1% Lidocaine.

Ultrasound demonstrated patency of the left basilic vein, and this
was documented with an image.  Under real-time ultrasound guidance,
this vein was accessed with a 21 gauge micropuncture needle and
image documentation was performed.  The needle was exchanged over a
guidewire for a peel-away sheath through which a five French single
lumen PICC trimmed to 50 cm was advanced, positioned with its tip
at the lower SVC/right atrial junction.  Fluoroscopy during the
procedure and fluoro spot radiograph confirms appropriate catheter
position.  The catheter was flushed, secured to the skin with
Prolene sutures, and covered with a sterile dressing.

Complications:  None.
IMPRESSION: Successful left arm PICC line placement with ultrasound and
fluoroscopic guidance.  The catheter is ready for use.

## 2013-10-27 ENCOUNTER — Other Ambulatory Visit: Payer: Self-pay | Admitting: Internal Medicine

## 2013-10-27 DIAGNOSIS — Z1231 Encounter for screening mammogram for malignant neoplasm of breast: Secondary | ICD-10-CM

## 2013-11-02 ENCOUNTER — Encounter: Payer: Self-pay | Admitting: Internal Medicine

## 2013-11-02 ENCOUNTER — Other Ambulatory Visit (HOSPITAL_COMMUNITY)
Admission: RE | Admit: 2013-11-02 | Discharge: 2013-11-02 | Disposition: A | Discharge status: Home / Self Care | Payer: Medicare Other | Source: Ambulatory Visit | Attending: Internal Medicine | Admitting: Internal Medicine

## 2013-11-02 ENCOUNTER — Ambulatory Visit (INDEPENDENT_AMBULATORY_CARE_PROVIDER_SITE_OTHER): Payer: Medicare Other | Admitting: Internal Medicine

## 2013-11-02 VITALS — BP 103/66 | HR 77 | Temp 98.1°F | Wt 166.6 lb

## 2013-11-02 DIAGNOSIS — R209 Unspecified disturbances of skin sensation: Secondary | ICD-10-CM

## 2013-11-02 DIAGNOSIS — E785 Hyperlipidemia, unspecified: Secondary | ICD-10-CM | POA: Diagnosis not present

## 2013-11-02 DIAGNOSIS — R200 Anesthesia of skin: Secondary | ICD-10-CM

## 2013-11-02 DIAGNOSIS — N179 Acute kidney failure, unspecified: Secondary | ICD-10-CM | POA: Diagnosis not present

## 2013-11-02 DIAGNOSIS — E1169 Type 2 diabetes mellitus with other specified complication: Secondary | ICD-10-CM | POA: Diagnosis not present

## 2013-11-02 DIAGNOSIS — N760 Acute vaginitis: Secondary | ICD-10-CM | POA: Insufficient documentation

## 2013-11-02 DIAGNOSIS — Z113 Encounter for screening for infections with a predominantly sexual mode of transmission: Secondary | ICD-10-CM | POA: Diagnosis not present

## 2013-11-02 DIAGNOSIS — D649 Anemia, unspecified: Secondary | ICD-10-CM | POA: Diagnosis not present

## 2013-11-02 DIAGNOSIS — Z0000 Encounter for general adult medical examination without abnormal findings: Secondary | ICD-10-CM | POA: Diagnosis not present

## 2013-11-02 DIAGNOSIS — M1710 Unilateral primary osteoarthritis, unspecified knee: Secondary | ICD-10-CM

## 2013-11-02 DIAGNOSIS — R109 Unspecified abdominal pain: Secondary | ICD-10-CM

## 2013-11-02 DIAGNOSIS — E119 Type 2 diabetes mellitus without complications: Secondary | ICD-10-CM | POA: Diagnosis not present

## 2013-11-02 DIAGNOSIS — R110 Nausea: Secondary | ICD-10-CM

## 2013-11-02 DIAGNOSIS — IMO0002 Reserved for concepts with insufficient information to code with codable children: Secondary | ICD-10-CM

## 2013-11-02 DIAGNOSIS — R202 Paresthesia of skin: Secondary | ICD-10-CM

## 2013-11-02 DIAGNOSIS — G629 Polyneuropathy, unspecified: Secondary | ICD-10-CM | POA: Insufficient documentation

## 2013-11-02 DIAGNOSIS — I10 Essential (primary) hypertension: Secondary | ICD-10-CM | POA: Diagnosis not present

## 2013-11-02 DIAGNOSIS — E1143 Type 2 diabetes mellitus with diabetic autonomic (poly)neuropathy: Secondary | ICD-10-CM | POA: Insufficient documentation

## 2013-11-02 LAB — COMPLETE METABOLIC PANEL WITH GFR
ALT: 22 U/L (ref 0–35)
AST: 18 U/L (ref 0–37)
Albumin: 4.7 g/dL (ref 3.5–5.2)
Alkaline Phosphatase: 72 U/L (ref 39–117)
BUN: 23 mg/dL (ref 6–23)
CO2: 28 mEq/L (ref 19–32)
Calcium: 9.9 mg/dL (ref 8.4–10.5)
Chloride: 100 mEq/L (ref 96–112)
Creat: 1.31 mg/dL — ABNORMAL HIGH (ref 0.50–1.10)
GFR, Est African American: 49 mL/min — ABNORMAL LOW
GFR, Est Non African American: 42 mL/min — ABNORMAL LOW
Glucose, Bld: 108 mg/dL — ABNORMAL HIGH (ref 70–99)
Potassium: 4.6 mEq/L (ref 3.5–5.3)
Sodium: 141 mEq/L (ref 135–145)
Total Bilirubin: 0.3 mg/dL (ref 0.2–1.2)
Total Protein: 7.5 g/dL (ref 6.0–8.3)

## 2013-11-02 LAB — CBC WITH DIFFERENTIAL/PLATELET
Basophils Absolute: 0 10*3/uL (ref 0.0–0.1)
Basophils Relative: 0 % (ref 0–1)
Eosinophils Absolute: 0.1 10*3/uL (ref 0.0–0.7)
Eosinophils Relative: 3 % (ref 0–5)
HCT: 32.6 % — ABNORMAL LOW (ref 36.0–46.0)
Hemoglobin: 10.5 g/dL — ABNORMAL LOW (ref 12.0–15.0)
Lymphocytes Relative: 38 % (ref 12–46)
Lymphs Abs: 1.5 10*3/uL (ref 0.7–4.0)
MCH: 31.6 pg (ref 26.0–34.0)
MCHC: 32.2 g/dL (ref 30.0–36.0)
MCV: 98.2 fL (ref 78.0–100.0)
Monocytes Absolute: 0.3 10*3/uL (ref 0.1–1.0)
Monocytes Relative: 8 % (ref 3–12)
Neutro Abs: 2 10*3/uL (ref 1.7–7.7)
Neutrophils Relative %: 51 % (ref 43–77)
Platelets: 217 10*3/uL (ref 150–400)
RBC: 3.32 MIL/uL — ABNORMAL LOW (ref 3.87–5.11)
RDW: 15.8 % — ABNORMAL HIGH (ref 11.5–15.5)
WBC: 3.9 10*3/uL — ABNORMAL LOW (ref 4.0–10.5)

## 2013-11-02 LAB — URINALYSIS, ROUTINE W REFLEX MICROSCOPIC
Bilirubin Urine: NEGATIVE
Glucose, UA: NEGATIVE mg/dL
Hgb urine dipstick: NEGATIVE
Ketones, ur: NEGATIVE mg/dL
Leukocytes, UA: NEGATIVE
Nitrite: NEGATIVE
Protein, ur: NEGATIVE mg/dL
Specific Gravity, Urine: 1.02 (ref 1.005–1.030)
Urobilinogen, UA: 1 mg/dL (ref 0.0–1.0)
pH: 7 (ref 5.0–8.0)

## 2013-11-02 MED ORDER — DULOXETINE HCL 30 MG PO CPEP: 30.0000 mg | ORAL_CAPSULE | Freq: Every day | ORAL | Status: DC

## 2013-11-02 MED ORDER — PROMETHAZINE HCL 25 MG PO TABS: 25.0000 mg | ORAL_TABLET | Freq: Four times a day (QID) | ORAL | Status: DC | PRN

## 2013-11-02 NOTE — Patient Instructions (Addendum)
General Instructions: Please follow up in 1-2 weeks sooner if needed for abdominal pain.   Take Phenergan for nausea  Take Cymbalta for numbness and tingling in hands and feet  So far your labs are okay.  We will notify of any further labs Prescriptions were sent to [facility]    Treatment Goals:  Goals (1 Years of Data) as of 11/02/13         As of Today 09/02/13 05/25/13 04/22/13 02/25/13     Blood Pressure    . Blood Pressure < 140/80  103/66 116/67 132/71 146/82 153/83     Result Component    . HEMOGLOBIN A1C < 7.0   7.7 7.2      . LDL CALC < 100            Progress Toward Treatment Goals:  Treatment Goal 11/02/2013  Hemoglobin A1C unchanged  Blood pressure at goal    Self Care Goals & Plans:  Self Care Goal 11/02/2013  Manage my medications take my medicines as prescribed; bring my medications to every visit; refill my medications on time  Monitor my health keep track of my blood glucose; bring my glucose meter and log to each visit  Eat healthy foods eat foods that are low in salt; eat baked foods instead of fried foods; drink diet soda or water instead of juice or soda; eat more vegetables; eat fruit for snacks and desserts; eat smaller portions  Be physically active find an activity I enjoy  Meeting treatment goals maintain the current self-care plan    Home Blood Glucose Monitoring 11/02/2013  Check my blood sugar no home glucose monitoring  When to check my blood sugar N/A     Care Management & Community Referrals:  Referral 11/02/2013  Referrals made for care management support none needed  Referrals made to community resources none      Promethazine tablets What is this medicine? PROMETHAZINE (proe METH a zeen) is an antihistamine. It is used to treat allergic reactions and to treat or prevent nausea and vomiting from illness or motion sickness. It is also used to make you sleep before surgery, and to help treat pain or nausea after surgery. This  medicine may be used for other purposes; ask your health care provider or pharmacist if you have questions. COMMON BRAND NAME(S): Phenergan What should I tell my health care provider before I take this medicine? They need to know if you have any of these conditions: -glaucoma -high blood pressure or heart disease -kidney disease -liver disease -lung or breathing disease, like asthma -prostate trouble -pain or difficulty passing urine -seizures -an unusual or allergic reaction to promethazine or phenothiazines, other medicines, foods, dyes, or preservatives -pregnant or trying to get pregnant -breast-feeding How should I use this medicine? Take this medicine by mouth with a glass of water. Follow the directions on the prescription label. Take your doses at regular intervals. Do not take your medicine more often than directed. Talk to your pediatrician regarding the use of this medicine in children. Special care may be needed. This medicine should not be given to infants and children younger than 39 years old. Overdosage: If you think you have taken too much of this medicine contact a poison control center or emergency room at once. NOTE: This medicine is only for you. Do not share this medicine with others. What if I miss a dose? If you miss a dose, take it as soon as you can. If it  is almost time for your next dose, take only that dose. Do not take double or extra doses. What may interact with this medicine? Do not take this medicine with any of the following medications: -cisapride -dofetilide -dronedarone -MAOIs like Carbex, Eldepryl, Marplan, Nardil, Parnate -pimozide -quinidine, including dextromethorphan; quinidine -thioridazine -ziprasidone This medicine may also interact with the following medications: -certain medicines for depression, anxiety, or psychotic disturbances -certain medicines for anxiety or sleep -certain medicines for seizures like carbamazepine, phenobarbital,  phenytoin -certain medicines for movement abnormalities as in Parkinson's disease, or for gastrointestinal problems -epinephrine -medicines for allergies or colds -muscle relaxants -narcotic medicines for pain -other medicines that prolong the QT interval (cause an abnormal heart rhythm) -tramadol -trimethobenzamide This list may not describe all possible interactions. Give your health care provider a list of all the medicines, herbs, non-prescription drugs, or dietary supplements you use. Also tell them if you smoke, drink alcohol, or use illegal drugs. Some items may interact with your medicine. What should I watch for while using this medicine? Tell your doctor or health care professional if your symptoms do not start to get better in 1 to 2 days. You may get drowsy or dizzy. Do not drive, use machinery, or do anything that needs mental alertness until you know how this medicine affects you. To reduce the risk of dizzy or fainting spells, do not stand or sit up quickly, especially if you are an older patient. Alcohol may increase dizziness and drowsiness. Avoid alcoholic drinks. Your mouth may get dry. Chewing sugarless gum or sucking hard candy, and drinking plenty of water may help. Contact your doctor if the problem does not go away or is severe. This medicine may cause dry eyes and blurred vision. If you wear contact lenses you may feel some discomfort. Lubricating drops may help. See your eye doctor if the problem does not go away or is severe. This medicine can make you more sensitive to the sun. Keep out of the sun. If you cannot avoid being in the sun, wear protective clothing and use sunscreen. Do not use sun lamps or tanning beds/booths. If you are diabetic, check your blood-sugar levels regularly. What side effects may I notice from receiving this medicine? Side effects that you should report to your doctor or health care professional as soon as possible: -blurred vision -irregular  heartbeat, palpitations or chest pain -muscle or facial twitches -pain or difficulty passing urine -seizures -skin rash -slowed or shallow breathing -unusual bleeding or bruising -yellowing of the eyes or skin Side effects that usually do not require medical attention (report to your doctor or health care professional if they continue or are bothersome): -headache -nightmares, agitation, nervousness, excitability, not able to sleep (these are more likely in children) -stuffy nose This list may not describe all possible side effects. Call your doctor for medical advice about side effects. You may report side effects to FDA at 1-800-FDA-1088. Where should I keep my medicine? Keep out of the reach of children. Store at room temperature, between 20 and 25 degrees C (68 and 77 degrees F). Protect from light. Throw away any unused medicine after the expiration date. NOTE: This sheet is a summary. It may not cover all possible information. If you have questions about this medicine, talk to your doctor, pharmacist, or health care provider.  2015, [employer]vier/Gold Standard. (2012-12-21 15:04:46)  Abdominal Pain Many things can cause abdominal pain. Usually, abdominal pain is not caused by a disease and will improve without treatment. It  can often be observed and treated at home. Your health care provider will do a physical exam and possibly order blood tests and X-rays to help determine the seriousness of your pain. However, in many cases, more time must pass before a clear cause of the pain can be found. Before that point, your health care provider may not know if you need more testing or further treatment. HOME CARE INSTRUCTIONS  Monitor your abdominal pain for any changes. The following actions may help to alleviate any discomfort you are experiencing:  Only take over-the-counter or prescription medicines as directed by your health care provider.  Do not take laxatives unless directed to do so by  your health care provider.  Try a clear liquid diet (broth, tea, or water) as directed by your health care provider. Slowly move to a bland diet as tolerated. SEEK MEDICAL CARE IF:  You have unexplained abdominal pain.  You have abdominal pain associated with nausea or diarrhea.  You have pain when you urinate or have a bowel movement.  You experience abdominal pain that wakes you in the night.  You have abdominal pain that is worsened or improved by eating food.  You have abdominal pain that is worsened with eating fatty foods.  You have a fever. SEEK IMMEDIATE MEDICAL CARE IF:   Your pain does not go away within 2 hours.  You keep throwing up (vomiting).  Your pain is felt only in portions of the abdomen, such as the right side or the left lower portion of the abdomen.  You pass bloody or black tarry stools. MAKE SURE YOU:  Understand these instructions.   Will watch your condition.   Will get help right away if you are not doing well or get worse.  Document Released: 01/29/2005 Document Revised: 04/26/2013 Document Reviewed: 12/29/2012 [facility] Patient Information 2015 [place], [place]. This information is not intended to replace advice given to you by your health care provider. Make sure you discuss any questions you have with your health care provider.  Duloxetine delayed-release capsules What is this medicine? DULOXETINE (doo LOX e teen) is used to treat depression, anxiety, and different types of chronic pain. This medicine may be used for other purposes; ask your health care provider or pharmacist if you have questions. COMMON BRAND NAME(S): Cymbalta What should I tell my health care provider before I take this medicine? They need to know if you have any of these conditions: -bipolar disorder or a family history of bipolar disorder -glaucoma -kidney disease -liver disease -suicidal thoughts or a previous suicide attempt -taken medicines called MAOIs like  Carbex, Eldepryl, Marplan, Nardil, and Parnate within 14 days -an unusual reaction to duloxetine, other medicines, foods, dyes, or preservatives -pregnant or trying to get pregnant -breast-feeding How should I use this medicine? Take this medicine by mouth with a glass of water. Follow the directions on the prescription label. Do not cut, crush or chew this medicine. You can take this medicine with or without food. Take your medicine at regular intervals. Do not take your medicine more often than directed. Do not stop taking this medicine suddenly except upon the advice of your doctor. Stopping this medicine too quickly may cause serious side effects or your condition may worsen. A special MedGuide will be given to you by the pharmacist with each prescription and refill. Be sure to read this information carefully each time. Talk to your pediatrician regarding the use of this medicine in children. While this drug may be prescribed  for children as young as 26 years of age for selected conditions, precautions do apply. Overdosage: If you think you have taken too much of this medicine contact a poison control center or emergency room at once. NOTE: This medicine is only for you. Do not share this medicine with others. What if I miss a dose? If you miss a dose, take it as soon as you can. If it is almost time for your next dose, take only that dose. Do not take double or extra doses. What may interact with this medicine? Do not take this medicine with any of the following medications: -certain diet drugs like dexfenfluramine, fenfluramine -desvenlafaxine -linezolid -MAOIs like Azilect, Carbex, Eldepryl, Marplan, Nardil, and Parnate -methylene blue (intravenous) -milnacipran -thioridazine -venlafaxine This medicine may also interact with the following medications: -alcohol -aspirin and aspirin-like medicines -certain antibiotics like ciprofloxacin and enoxacin -certain medicines for blood  pressure, heart disease, irregular heart beat -certain medicines for depression, anxiety, or psychotic disturbances -certain medicines for migraine headache like almotriptan, eletriptan, frovatriptan, naratriptan, rizatriptan, sumatriptan, zolmitriptan -certain medicines that treat or prevent blood clots like warfarin, enoxaparin, and dalteparin -cimetidine -fentanyl -lithium -NSAIDS, medicines for pain and inflammation, like ibuprofen or naproxen -phentermine -procarbazine -sibutramine -St. John's wort -theophylline -tramadol -tryptophan This list may not describe all possible interactions. Give your health care provider a list of all the medicines, herbs, non-prescription drugs, or dietary supplements you use. Also tell them if you smoke, drink alcohol, or use illegal drugs. Some items may interact with your medicine. What should I watch for while using this medicine? Tell your doctor if your symptoms do not get better or if they get worse. Visit your doctor or health care professional for regular checks on your progress. Because it may take several weeks to see the full effects of this medicine, it is important to continue your treatment as prescribed by your doctor. Patients and their families should watch out for new or worsening thoughts of suicide or depression. Also watch out for sudden changes in feelings such as feeling anxious, agitated, panicky, irritable, hostile, aggressive, impulsive, severely restless, overly excited and hyperactive, or not being able to sleep. If this happens, especially at the beginning of treatment or after a change in dose, call your health care professional. Dennis Bast may get drowsy or dizzy. Do not drive, use machinery, or do anything that needs mental alertness until you know how this medicine affects you. Do not stand or sit up quickly, especially if you are an older patient. This reduces the risk of dizzy or fainting spells. Alcohol may interfere with the effect  of this medicine. Avoid alcoholic drinks. This medicine can cause an increase in blood pressure. This medicine can also cause a sudden drop in your blood pressure, which may make you feel faint and increase the chance of a fall. These effects are most common when you first start the medicine or when the dose is increased, or during use of other medicines that can cause a sudden drop in blood pressure. Check with your doctor for instructions on monitoring your blood pressure while taking this medicine. Your mouth may get dry. Chewing sugarless gum or sucking hard candy, and drinking plenty of water may help. Contact your doctor if the problem does not go away or is severe. What side effects may I notice from receiving this medicine? Side effects that you should report to your doctor or health care professional as soon as possible: -allergic reactions like skin rash, itching or  hives, swelling of the face, lips, or tongue -changes in blood pressure -confusion -dark urine -dizziness -fast talking and excited feelings or actions that are out of control -fast, irregular heartbeat -fever -general ill feeling or flu-like symptoms -hallucination, loss of contact with reality -light-colored stools -loss of balance or coordination -redness, blistering, peeling or loosening of the skin, including inside the mouth -right upper belly pain -seizures -suicidal thoughts or other mood changes -trouble concentrating -trouble passing urine or change in the amount of urine -unusual bleeding or bruising -unusually weak or tired -yellowing of the eyes or skin Side effects that usually do not require medical attention (report to your doctor or health care professional if they continue or are bothersome): -blurred vision -change in appetite -change in sex drive or performance -headache -increased sweating -nausea This list may not describe all possible side effects. Call your doctor for medical advice about  side effects. You may report side effects to FDA at 1-800-FDA-1088. Where should I keep my medicine? Keep out of the reach of children. Store at room temperature between 15 and 30 degrees C (59 and 86 degrees F). Throw away any unused medicine after the expiration date. NOTE: This sheet is a summary. It may not cover all possible information. If you have questions about this medicine, talk to your doctor, pharmacist, or health care provider.  2015, Elsevier/Gold Standard. (2013-04-12 14:20:31)

## 2013-11-03 ENCOUNTER — Encounter: Payer: Self-pay | Admitting: Internal Medicine

## 2013-11-04 NOTE — Progress Notes (Signed)
   Subjective:    Patient ID: [name], female    DOB: 06/07/1945, 68 y.o.   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 Carloyn Manner ?), EF 03/2012 60-65% grade 1.    She presented for multiple complaints 1)Ab pain (lower b/l) since last month now 2-3 /10, intermittent, no radiation, nothing makes worse or better. Ab pain is "annoying" and feels like "pin sticks".  She feels nauseated, denies vomiting/diarrhea, fever/chills though she reports she always gets cold.  She is bothered by abnormal smells and the smell of food but has an appetite to eat but cant eat once she smells the food.    2)She reports vaginal discharge ?color possibly yellow b/c she states it is hard to tell when she looks in her underwear.  She is sexually active with her husband though she does not have the desire esp since going to breast cancer and treatment.    3)She c/o numbness/tingling in toes/fingers esp. After chemo      Review of Systems  Respiratory: Negative for shortness of breath.   Cardiovascular: Negative for chest pain.  Genitourinary: Negative for dysuria.       Denies urine odor        Objective:   Physical Exam  Nursing note and vitals reviewed. Constitutional: [name], place, and time. Vital signs are normal. She appears well-developed and well-nourished. She is cooperative. No distress.  HENT:  Head: Normocephalic and atraumatic.  Mouth/Throat: Oropharynx is clear and moist and mucous membranes are normal. No oropharyngeal exudate.  Eyes: Conjunctivae are normal. Pupils are equal, round, and reactive to light. Right eye exhibits no  discharge. Left eye exhibits no discharge. No scleral icterus.  Cardiovascular: Normal rate, regular rhythm, S1 normal, S2 normal and normal heart sounds.   No murmur heard. No lower ext edema  Pulmonary/Chest: Effort normal and breath sounds normal. No respiratory distress. She has no wheezes.  Abdominal: Soft. Bowel sounds are normal. There is no tenderness.  No ttp lower ab on exam  Genitourinary: Pelvic exam was performed with patient supine. No bleeding around the vagina. Vaginal discharge found.  Scant white discharge on pelvic exam  Musculoskeletal:       Legs: Neurological: She is alert and oriented to person, place, and time. Gait normal.  Skin: Skin is warm and dry. No rash noted. She is not diaphoretic.  Psychiatric: She has a normal mood and affect. Her speech is normal and behavior is normal. Judgment and thought content normal. Cognition and memory are normal.          Assessment & Plan:  F/u in 1-2 weeks if not better

## 2013-11-04 NOTE — Assessment & Plan Note (Addendum)
Subjectively mild b/l lower ab pain per pt for 1 month associated with nausea w/ certain smells Etiology unclear could be related to Arimidex (oral chemo pt taking for breast cancer can cause n/v/ab pain) Stat CMET, CBC, pelvic exam (neg GC/CT, BV, trich, yeast) to w/u for infectious etiology  CMET , CBC, UA unremarkable except leukopenia which could be side effect of Arimedex Rx Phenerghan 25 q 6 prn for nausea  RTC in 1-2 weeks if not improved

## 2013-11-04 NOTE — Assessment & Plan Note (Signed)
Will f/u with orthopedic MD

## 2013-11-04 NOTE — Assessment & Plan Note (Signed)
Noted in upper and lower ext ? If side effect of prior chemo tx pt states she has noticed after chemo Will try Cymbalta 30 mg qd to see if helps per up to date could use Cymbalta which is rec or try Gabapentin which has less evidence of being effective for chemo tx induced parathesias

## 2013-11-08 NOTE — Progress Notes (Signed)
Case discussed with Dr. McLean soon after the resident saw the patient.  We reviewed the resident's history and exam and pertinent patient test results.  I agree with the assessment, diagnosis, and plan of care documented in the resident's note. 

## 2013-11-10 DIAGNOSIS — M25569 Pain in unspecified knee: Secondary | ICD-10-CM | POA: Diagnosis not present

## 2013-12-06 ENCOUNTER — Ambulatory Visit (HOSPITAL_COMMUNITY): Payer: Medicare Other

## 2013-12-06 ENCOUNTER — Other Ambulatory Visit (HOSPITAL_BASED_OUTPATIENT_CLINIC_OR_DEPARTMENT_OTHER): Payer: Medicare Other

## 2013-12-06 ENCOUNTER — Other Ambulatory Visit: Payer: Self-pay | Admitting: Internal Medicine

## 2013-12-06 ENCOUNTER — Ambulatory Visit (HOSPITAL_COMMUNITY)
Admission: RE | Admit: 2013-12-06 | Discharge: 2013-12-06 | Disposition: A | Discharge status: Home / Self Care | Payer: Medicare Other | Source: Ambulatory Visit | Attending: Internal Medicine | Admitting: Internal Medicine

## 2013-12-06 DIAGNOSIS — Z1231 Encounter for screening mammogram for malignant neoplasm of breast: Secondary | ICD-10-CM | POA: Diagnosis not present

## 2013-12-06 DIAGNOSIS — C773 Secondary and unspecified malignant neoplasm of axilla and upper limb lymph nodes: Principal | ICD-10-CM

## 2013-12-06 DIAGNOSIS — C50919 Malignant neoplasm of unspecified site of unspecified female breast: Secondary | ICD-10-CM

## 2013-12-06 DIAGNOSIS — C50319 Malignant neoplasm of lower-inner quadrant of unspecified female breast: Secondary | ICD-10-CM

## 2013-12-06 LAB — COMPREHENSIVE METABOLIC PANEL (CC13)
ALT: 19 U/L (ref 0–55)
AST: 18 U/L (ref 5–34)
Albumin: 4.3 g/dL (ref 3.5–5.0)
Alkaline Phosphatase: 66 U/L (ref 40–150)
Anion Gap: 10 mEq/L (ref 3–11)
BUN: 23 mg/dL (ref 7.0–26.0)
CO2: 25 mEq/L (ref 22–29)
Calcium: 9.8 mg/dL (ref 8.4–10.4)
Chloride: 105 mEq/L (ref 98–109)
Creatinine: 1.4 mg/dL — ABNORMAL HIGH (ref 0.6–1.1)
Glucose: 126 mg/dl (ref 70–140)
Potassium: 4 mEq/L (ref 3.5–5.1)
Sodium: 141 mEq/L (ref 136–145)
Total Bilirubin: 0.32 mg/dL (ref 0.20–1.20)
Total Protein: 7.2 g/dL (ref 6.4–8.3)

## 2013-12-06 LAB — CBC WITH DIFFERENTIAL/PLATELET
BASO%: 1.1 % (ref 0.0–2.0)
Basophils Absolute: 0 10*3/uL (ref 0.0–0.1)
EOS%: 2.3 % (ref 0.0–7.0)
Eosinophils Absolute: 0.1 10*3/uL (ref 0.0–0.5)
HCT: 33.1 % — ABNORMAL LOW (ref 34.8–46.6)
HGB: 10.7 g/dL — ABNORMAL LOW (ref 11.6–15.9)
LYMPH%: 34.7 % (ref 14.0–49.7)
MCH: 32.6 pg (ref 25.1–34.0)
MCHC: 32.4 g/dL (ref 31.5–36.0)
MCV: 100.7 fL (ref 79.5–101.0)
MONO#: 0.2 10*3/uL (ref 0.1–0.9)
MONO%: 6.4 % (ref 0.0–14.0)
NEUT#: 2.1 10*3/uL (ref 1.5–6.5)
NEUT%: 55.5 % (ref 38.4–76.8)
Platelets: 199 10*3/uL (ref 145–400)
RBC: 3.29 10*6/uL — ABNORMAL LOW (ref 3.70–5.45)
RDW: 15.8 % — ABNORMAL HIGH (ref 11.2–14.5)
WBC: 3.9 10*3/uL (ref 3.9–10.3)
lymph#: 1.3 10*3/uL (ref 0.9–3.3)

## 2013-12-13 ENCOUNTER — Telehealth: Payer: Self-pay | Admitting: Nurse Practitioner

## 2013-12-13 ENCOUNTER — Ambulatory Visit (HOSPITAL_BASED_OUTPATIENT_CLINIC_OR_DEPARTMENT_OTHER): Payer: Medicare Other | Admitting: Nurse Practitioner

## 2013-12-13 VITALS — BP 131/61 | HR 83 | Temp 98.8°F | Resp 18 | Ht 59.0 in | Wt 162.6 lb

## 2013-12-13 DIAGNOSIS — N9489 Other specified conditions associated with female genital organs and menstrual cycle: Secondary | ICD-10-CM | POA: Diagnosis not present

## 2013-12-13 DIAGNOSIS — N898 Other specified noninflammatory disorders of vagina: Secondary | ICD-10-CM | POA: Insufficient documentation

## 2013-12-13 DIAGNOSIS — Z170 Estrogen receptor positive status [ER+]: Secondary | ICD-10-CM | POA: Diagnosis not present

## 2013-12-13 DIAGNOSIS — M899 Disorder of bone, unspecified: Secondary | ICD-10-CM | POA: Diagnosis not present

## 2013-12-13 DIAGNOSIS — C50319 Malignant neoplasm of lower-inner quadrant of unspecified female breast: Secondary | ICD-10-CM | POA: Diagnosis not present

## 2013-12-13 DIAGNOSIS — C50911 Malignant neoplasm of unspecified site of right female breast: Secondary | ICD-10-CM

## 2013-12-13 DIAGNOSIS — C50919 Malignant neoplasm of unspecified site of unspecified female breast: Secondary | ICD-10-CM

## 2013-12-13 DIAGNOSIS — C773 Secondary and unspecified malignant neoplasm of axilla and upper limb lymph nodes: Secondary | ICD-10-CM

## 2013-12-13 DIAGNOSIS — M949 Disorder of cartilage, unspecified: Secondary | ICD-10-CM

## 2013-12-13 DIAGNOSIS — M8580 Other specified disorders of bone density and structure, unspecified site: Secondary | ICD-10-CM

## 2013-12-13 MED ORDER — ANASTROZOLE 1 MG PO TABS: ORAL_TABLET | ORAL | Status: DC

## 2013-12-13 NOTE — Progress Notes (Addendum)
ID: [name]   DOB: [birth year]  MR#: [id number]  LKJ#:[id number]   PCP: Jillian Maxin, DO GYN: [name] SU: [name] OTHER MD: [name], [name]  CHIEF COMPLAINT: Right breast cancer s/p mastectomy  CURRENT THERAPY: Anastrozole 33m daily  BREAST CANCER HISTORY : [name] [facility] This showed a possible right axillary mass and she was brought back for additional views Sep 20, 2010. An ultrasound showed abnormal enlarged lymph nodes in the right axilla measuring a maximum of 2.2 cm. Biopsy was suggested and performed on Sep 25, 2010. The pathology from that procedure (([id number] showed metastatic adenocarcinoma consistent with a breast primary. In particular, the tumor was estrogen and progesterone receptor positive, HER2/neu was negative, the MIB-1 was 40%.  With this information, the patient was referred for bilateral breast MRIs performed May 29. This showed a complex picture, there being in addition to 2 pathologically enlarged lymph nodes in the right axilla and then other mildly prominent lymph nodes, 2 masses in the breast itself, one was subareolar and measured 1.3 cm, the second mass was more posterior and inferior and measured 8 mm. The distance between these 2 masses was 4.5 cm. the second breast mass was biopsied and was found to be benign.  She has a deleterious BRCA-2 mutation, namely 6696 deletion TC.  Status post right mastectomy and axillary lymph node dissection December 04, 2010 for a 1.5 cm invasive ductal carcinoma, grade 3, involving 3/12 axillary lymph nodes, with ample margins, T1c N1 M0, estrogen receptor 94% positive, progesterone receptor 4% positive with an MIB-1 of 62% and no HER2 amplification on the original material. Further therapy as detailed below.  INTERVAL HISTORY:  [name] today for follow up of her right breast cancer. The interval history is unremarkable. She had a superficial  infection associated with a right knee surgery that required antibiotics and resulted in vaginal yeast before her last visit. This is not currently an issue. She has, however, been taking premarin for vaginal dryness prescribed to her by an internal medicine MD, Jillian Pertafter she had some mild bleeding after sex. She has not seen any bleeding lately, but still uses the cream on occasion when her husband approaches her for sexual intimacy. Also of note, she has slacked on her exercise routine, stating she doesn't walk as much as she is used to, since her knee surgery.  REVIEW OF SYSTEMS:  [name] tolerating the anastrozole well, with vaginal dryness as her most significant side effect. She experiences hot flashes almost daily, but she denies the offer for medical intervention. She complains of minor aches and pains to her bilateral breasts. A detailed review of systems was otherwise negative.   PAST MEDICAL HISTORY: Past Medical History  Diagnosis Date  . Allergy   . Hypertension   . Hyperlipidemia   . Fatty liver disease, nonalcoholic   . History of hyperkalemia   . Obesity   . Renal lesion   . Hx antineoplastic chemo  10/12 - 06/24/11    PACLITAX EL COMPLETED 06/24/11  . Anemia   . Diabetes mellitus ORAL MED  . OA (osteoarthritis) of knee     RIGHT LEG  . Mild nonproliferative diabetic retinopathy of right eye 03/19/11    Dr. RJoseph Ryan . Breast CA     (Rt) breast ca dx 4/12---  S/P RADICAL MASTECTOMY AND CHEMORADIATION  . Numbness and tingling     Hx: of in fingers  and toes since chemotherapy    PAST SURGICAL HISTORY: Past Surgical History  Procedure Laterality Date  . Knee arthroscopy  10-28-1999    RIGHT  . Mastectomy modified radical  12-04-2010    W/ LEFT PAC PLACEMENT (RIGHT BREAST W/ AXILLARY CONTENTS/ NODE BX'S  . Transthoracic echocardiogram  10-29-2010    NORMAL LVF/ EF 55-60%/ MILD MV REGURG  . Vaginal hysterectomy  AGE 70    W/ LSO  . Tubal ligation   YRS AGO  . Laparoscopy with laparoscopic right salpingo oophorectomy and lysis of pelvic and abdominal adhesions  March 2013    Dr. Nori Ryan   . Portacath placement      REPLACED [facility] DUE TO MALFUNCTION (LEFT)  . Port-a-cath removal  09/24/2011    Procedure: REMOVAL PORT-A-CATH;  Surgeon: [name]. Cornett, MD;  Location: [facility];  Service: General;  Laterality: N/A;  . Colonoscopy w/ polypectomy      Hx; of  . Total knee arthroplasty Right 08/16/2012    Procedure: TOTAL KNEE ARTHROPLASTY;  Surgeon: Jillian Salen, MD;  Location: [place];  Service: Orthopedics;  Laterality: Right;  . I&d knee with poly exchange Right 09/03/2012    Procedure: IRRIGATION AND DEBRIDEMENT RIGHT KNEE WITH POLY EXCHANGE;  Surgeon: Jillian Salen, MD;  Location: [place];  Service: Orthopedics;  Laterality: Right;    FAMILY HISTORY Family History  Problem Relation Age of Onset  . Stroke Father   . Heart disease Sister   . Cancer Sister     breast  . Heart disease Brother   . Cancer Sister     COLON CANCER  patient's mother alive age 77; father deceased, cause unknown. The patient has 8 sisters and 9 brothers.  GYNECOLOGIC HISTORY: GX, P4, first pregnancy age 75; hysterectomy age 58 with USO; right USO 2013; on premarin >20 years, discontinued at the time of breast cancer diagnosis  SOCIAL HISTORY: retired from office work; husband [name] works as a Dealer; 4 daughters, 3 in [place] {[name], [name] and [name]), 1 in [place] ([name]); 3 grandsons; attends [name].   ADVANCED DIRECTIVES:  HEALTH MAINTENANCE: History  Substance Use Topics  . Smoking status: Never Smoker   . Smokeless tobacco: Never Used  . Alcohol Use: No     Colonoscopy: Approx 2011  PAP:  UTD   Bone density: August 2013;Young Adult T Score: -1.3  Lipid panel: followed by PCP, UTD  Allergies  Allergen Reactions  . Metformin Other (See Comments)    REACTION, diarrhea: 850 mg - diarrhea but tolerates 500 mg daily    Current  Outpatient Prescriptions  Medication Sig Dispense Refill  . anastrozole (ARIMIDEX) 1 MG tablet TAKE 1 TABLET BY MOUTH DAILY  30 tablet  7  . atorvastatin (LIPITOR) 10 MG tablet Take 1 tablet (10 mg total) by mouth at bedtime.  90 tablet  3  . conjugated estrogens (PREMARIN) vaginal cream Place vaginally daily. 1-1.5 gram(s) in the vagina daily  42.5 g  12  . DULoxetine (CYMBALTA) 30 MG capsule Take 1 capsule (30 mg total) by mouth daily.  30 capsule  1  . fluticasone (FLONASE) 50 MCG/ACT nasal spray Place 2 sprays into both nostrils daily.  16 g  2  . glipiZIDE (GLUCOTROL) 5 MG tablet Take 1 tablet (5 mg total) by mouth daily before breakfast.  30 tablet  3  . hydrochlorothiazide (HYDRODIURIL) 25 MG tablet Take 1 tablet (25 mg total) by mouth daily.  90 tablet  2  .  lisinopril (PRINIVIL,ZESTRIL) 40 MG tablet Take 1 tablet (40 mg total) by mouth daily.  30 tablet  3  . metFORMIN (GLUCOPHAGE) 500 MG tablet Take 1 tablet (500 mg total) by mouth 2 (two) times daily with a meal.  60 tablet  5  . metoprolol succinate (TOPROL-XL) 50 MG 24 hr tablet Take 1 tablet (50 mg total) by mouth 2 (two) times daily. Take with or immediately following a meal.  90 tablet  2  . Polyvinyl Alcohol-Povidone (REFRESH OP) Place 1 drop into both eyes daily as needed. Dry eyes      . sulfamethoxazole-trimethoprim (BACTRIM,SEPTRA) 400-80 MG per tablet Take 1 tablet by mouth 2 (two) times daily.       No current facility-administered medications for this visit.    OBJECTIVE: Middle-aged [place] American woman who appears stated age 40 Vitals:   12/13/13 0940  BP: 131/61  Pulse: 83  Temp: 98.8 F (37.1 C)  Resp: 18     Body mass index is 32.82 kg/(m^2).    ECOG FS:0 Filed Weights   12/13/13 0940  Weight: 162 lb 9.6 oz (73.755 kg)   Skin: warm, dry  HEENT: sclerae anicteric, conjunctivae pink, oropharynx clear. No thrush or mucositis.  Lymph Nodes: No cervical or supraclavicular lymphadenopathy  Lungs: clear to  auscultation bilaterally, no rales, wheezes, or rhonci  Heart: regular rate and rhythm  Abdomen: round, soft, non tender, positive bowel sounds  Musculoskeletal: No focal spinal tenderness, no peripheral edema, right knee scar well healed Neuro: non focal, well oriented, positive affect Breasts: right breast status post mastectomy and radiation, no evidence of local recurrence, right axilla benign, left breast unremarkable   LAB RESULTS: Lab Results  Component Value Date   WBC 3.9 12/06/2013   NEUTROABS 2.1 12/06/2013   HGB 10.7* 12/06/2013   HCT 33.1* 12/06/2013   MCV 100.7 12/06/2013   PLT 199 12/06/2013      Chemistry      Component Value Date/Time   NA 141 12/06/2013 0957   NA 141 11/02/2013 1448   K 4.0 12/06/2013 0957   K 4.6 11/02/2013 1448   CL 100 11/02/2013 1448   CL 100 05/27/2012 0947   CO2 25 12/06/2013 0957   CO2 28 11/02/2013 1448   BUN 23.0 12/06/2013 0957   BUN 23 11/02/2013 1448   CREATININE 1.4* 12/06/2013 0957   CREATININE 1.31* 11/02/2013 1448   CREATININE 1.60* 09/17/2012 0156   GLU 132* 03/25/2011 0957      Component Value Date/Time   CALCIUM 9.8 12/06/2013 0957   CALCIUM 9.9 11/02/2013 1448   ALKPHOS 66 12/06/2013 0957   ALKPHOS 72 11/02/2013 1448   AST 18 12/06/2013 0957   AST 18 11/02/2013 1448   ALT 19 12/06/2013 0957   ALT 22 11/02/2013 1448   BILITOT 0.32 12/06/2013 0957   BILITOT 0.3 11/02/2013 1448       Lab Results  Component Value Date   LABCA2 12 05/27/2012    STUDIES: Most recent mammogram was performed on 12/07/13 and was unremarkable.   Most recent bone density scan was performed on 12/19/2011 and showed a T score of 2.8 in the lumbar spine (normal), and -1.3 in the left femur (mild osteopenia).   ASSESSMENT:68 y.o.  BRCA-2 positive [employer] woman,  (1) status post right mastectomy and axillary lymph node dissection August of 2012 for a T1c N1 (Stage IIA) invasive ductal carcinoma, grade 3, estrogen and progesterone receptor positive, HER-2 negative, with an MIB-1-1 of  62%,   (2) Status post 4 cycles of doxorubicin and cyclophosphamide given in dose dense fashion, followed  by weekly paclitaxel x 12 completed 06/24/2011  (3) received post-mastectomy radiation completed April 2013  (4)  started on anastrozole in May 2013; most recent mammogram August of 2013 showed mild osteopenia  (5) s/p remote hysterectomy and left salpingo-oophorectomy; s/p right salpingo-oophorectomy March 2013 with benign pathology  (6) started on anastrozole in May 2013, the goal being to continue for 5 years.   PLAN:  Calianne is doing well as far as her breast cancer is concerned. The labs were reviewed with her in detail and were stable. She is tolerating the anastrozole well, with vaginal dryness as her main complaint. She was explained the reasoning for stopping the premarin, and was advised to use water based lubricant or coconut oil instead. Information was provided on the "Healthy Pelvic Floor and Intimacy" seminar being held at the end of this month.   Rossana has been encouraged to pick up an exercise routine, such as walking daily to maintain bone health, as tolerated by her knee limitations. She was unsure about why her bone density scan had not been scheduled or performed prior to her visit here. Therefore one will be scheduled next week. She will return in February to see Dr. Jana Hakim with labs and an office visit, then she will be seen on a yearly basis.  Alayah understands and agrees with the plan. She has been encouraged to call with any issues that arise before her next visit here.    Marcelino Duster    12/13/2013

## 2013-12-13 NOTE — Telephone Encounter (Signed)
per pof to sch pt appt-sch BD @ [facility] gave pt copy of times & dates & location-pt has copy of sch

## 2013-12-19 ENCOUNTER — Ambulatory Visit
Admission: RE | Admit: 2013-12-19 | Discharge: 2013-12-19 | Disposition: A | Discharge status: Home / Self Care | Payer: Medicare Other | Source: Ambulatory Visit | Attending: Nurse Practitioner | Admitting: Nurse Practitioner

## 2013-12-19 DIAGNOSIS — M8580 Other specified disorders of bone density and structure, unspecified site: Secondary | ICD-10-CM

## 2013-12-19 DIAGNOSIS — Z780 Asymptomatic menopausal state: Secondary | ICD-10-CM | POA: Diagnosis not present

## 2013-12-19 DIAGNOSIS — C773 Secondary and unspecified malignant neoplasm of axilla and upper limb lymph nodes: Principal | ICD-10-CM

## 2013-12-19 DIAGNOSIS — C50911 Malignant neoplasm of unspecified site of right female breast: Secondary | ICD-10-CM

## 2014-01-24 DIAGNOSIS — H2510 Age-related nuclear cataract, unspecified eye: Secondary | ICD-10-CM | POA: Diagnosis not present

## 2014-01-24 DIAGNOSIS — E11329 Type 2 diabetes mellitus with mild nonproliferative diabetic retinopathy without macular edema: Secondary | ICD-10-CM | POA: Diagnosis not present

## 2014-01-24 LAB — HM DIABETES EYE EXAM

## 2014-02-14 ENCOUNTER — Other Ambulatory Visit: Payer: Self-pay | Admitting: Nurse Practitioner

## 2014-02-15 ENCOUNTER — Encounter: Payer: Self-pay | Admitting: *Deleted

## 2014-02-18 ENCOUNTER — Emergency Department (HOSPITAL_COMMUNITY)
Admission: EM | Admit: 2014-02-18 | Discharge: 2014-02-18 | Disposition: A | Discharge status: Home / Self Care | Payer: Medicare Other | Attending: Emergency Medicine | Admitting: Emergency Medicine

## 2014-02-18 ENCOUNTER — Encounter (HOSPITAL_COMMUNITY): Payer: Self-pay | Admitting: Emergency Medicine

## 2014-02-18 DIAGNOSIS — R21 Rash and other nonspecific skin eruption: Secondary | ICD-10-CM | POA: Diagnosis not present

## 2014-02-18 DIAGNOSIS — Z862 Personal history of diseases of the blood and blood-forming organs and certain disorders involving the immune mechanism: Secondary | ICD-10-CM | POA: Diagnosis not present

## 2014-02-18 DIAGNOSIS — Z803 Family history of malignant neoplasm of breast: Secondary | ICD-10-CM | POA: Insufficient documentation

## 2014-02-18 DIAGNOSIS — Z87448 Personal history of other diseases of urinary system: Secondary | ICD-10-CM | POA: Diagnosis not present

## 2014-02-18 DIAGNOSIS — E669 Obesity, unspecified: Secondary | ICD-10-CM | POA: Insufficient documentation

## 2014-02-18 DIAGNOSIS — I10 Essential (primary) hypertension: Secondary | ICD-10-CM | POA: Diagnosis not present

## 2014-02-18 DIAGNOSIS — Z7951 Long term (current) use of inhaled steroids: Secondary | ICD-10-CM | POA: Diagnosis not present

## 2014-02-18 DIAGNOSIS — E785 Hyperlipidemia, unspecified: Secondary | ICD-10-CM | POA: Insufficient documentation

## 2014-02-18 DIAGNOSIS — Z79899 Other long term (current) drug therapy: Secondary | ICD-10-CM | POA: Insufficient documentation

## 2014-02-18 DIAGNOSIS — Z9221 Personal history of antineoplastic chemotherapy: Secondary | ICD-10-CM | POA: Insufficient documentation

## 2014-02-18 DIAGNOSIS — Z792 Long term (current) use of antibiotics: Secondary | ICD-10-CM | POA: Insufficient documentation

## 2014-02-18 DIAGNOSIS — E119 Type 2 diabetes mellitus without complications: Secondary | ICD-10-CM | POA: Diagnosis not present

## 2014-02-18 MED ORDER — DIPHENHYDRAMINE HCL 25 MG PO TABS: 25.0000 mg | ORAL_TABLET | Freq: Four times a day (QID) | ORAL | Status: DC | PRN

## 2014-02-18 MED ORDER — PREDNISONE 20 MG PO TABS: ORAL_TABLET | ORAL | Status: DC

## 2014-02-18 NOTE — ED Notes (Signed)
Declined W/C at D/C and was escorted to lobby by RN. 

## 2014-02-18 NOTE — ED Notes (Signed)
Pt here with rash and itching to neck, back and sides. sts started a few days after the flu shot.

## 2014-02-18 NOTE — ED Notes (Signed)
pts vital signs updated pt awaiting discharge paperwork at bedside.  

## 2014-02-18 NOTE — ED Provider Notes (Signed)
CSN: [id number]     Arrival date & time 02/18/14  1317 History  This chart was scribed for non-physician practitioner Clayton Bibles working with Artis Delay, MD by Donato Schultz, ED Scribe. This patient was seen in room TR07C/TR07C and the patient's care was started at 3:10 PM.     Chief Complaint  Patient presents with  . Rash    Patient is a 68 y.o. female presenting with rash. The history is provided by the patient. No language interpreter was used.  Rash Associated symptoms: no abdominal pain, no diarrhea, no shortness of breath and not vomiting    HPI Comments: [name] is a 68 y.o. female with a history of seasonal allergies and DM who presents to the Emergency Department complaining of a gradually worsening, itchy rash on her left upper chest wall, posterior neck, single spot on abdomen, and single area of left upper back that started 6 days ago, the day after she got a flu shot.  The rash itches and does not hurt.  There has been no discharge from the rash.  She will normally experience some soreness after receiving a flu shot but states that this inoculation hurt more than usual.  She applied Peroxide to the area with no relief to her symptoms.  She denies changing her lotion, moisturizers, detergents, medications, or being exposed to any pets or insect bites since her symptoms started.  She denies sick contacts.  She has not experienced these symptoms in the past.  She denies weakness in her arms or legs, vomiting, diarrhea, abdominal pain, chest pain, new cough, SOB, fever, and throat or mouth swelling as associated symptom.  No other changes in medications or new medications started.  She does not have an allergy to eggs.   Past Medical History  Diagnosis Date  . Allergy   . Hypertension   . Hyperlipidemia   . Fatty liver disease, nonalcoholic   . History of hyperkalemia   . Obesity   . Renal lesion   . Hx antineoplastic chemo  10/12 - 06/24/11    PACLITAX EL COMPLETED  06/24/11  . Anemia   . Diabetes mellitus ORAL MED  . OA (osteoarthritis) of knee     RIGHT LEG  . Mild nonproliferative diabetic retinopathy of right eye 03/19/11    Dr. Joseph Art  . Breast CA     (Rt) breast ca dx 4/12---  S/P RADICAL MASTECTOMY AND CHEMORADIATION  . Numbness and tingling     Hx: of in fingers and toes since chemotherapy   Past Surgical History  Procedure Laterality Date  . Knee arthroscopy  10-28-1999    RIGHT  . Mastectomy modified radical  12-04-2010    W/ LEFT PAC PLACEMENT (RIGHT BREAST W/ AXILLARY CONTENTS/ NODE BX'S  . Transthoracic echocardiogram  10-29-2010    NORMAL LVF/ EF 55-60%/ MILD MV REGURG  . Vaginal hysterectomy  AGE 10    W/ LSO  . Tubal ligation  YRS AGO  . Laparoscopy with laparoscopic right salpingo oophorectomy and lysis of pelvic and abdominal adhesions  March 2013    Dr. Nori Riis   . Portacath placement      REPLACED [facility] DUE TO MALFUNCTION (LEFT)  . Port-a-cath removal  09/24/2011    Procedure: REMOVAL PORT-A-CATH;  Surgeon: Joyice Faster. Cornett, MD;  Location: [facility];  Service: General;  Laterality: N/A;  . Colonoscopy w/ polypectomy      Hx; of  . Total knee arthroplasty Right 08/16/2012    Procedure:  TOTAL KNEE ARTHROPLASTY;  Surgeon: Kerin Salen, MD;  Location: [place];  Service: Orthopedics;  Laterality: Right;  . I&d knee with poly exchange Right 09/03/2012    Procedure: IRRIGATION AND DEBRIDEMENT RIGHT KNEE WITH POLY EXCHANGE;  Surgeon: Kerin Salen, MD;  Location: [place];  Service: Orthopedics;  Laterality: Right;   Family History  Problem Relation Age of Onset  . Stroke Father   . Heart disease Sister   . Cancer Sister     breast  . Heart disease Brother   . Cancer Sister     COLON CANCER   History  Substance Use Topics  . Smoking status: Never Smoker   . Smokeless tobacco: Never Used  . Alcohol Use: No   OB History   Grav Para Term Preterm Abortions TAB SAB Ect Mult Living   4 4 4       4      Review of Systems   HENT: Negative for trouble swallowing.   Respiratory: Negative for cough and shortness of breath.   Cardiovascular: Negative for chest pain.  Gastrointestinal: Negative for vomiting, abdominal pain and diarrhea.  Skin: Positive for rash.  Neurological: Negative for weakness.  All other systems reviewed and are negative.     Allergies  Metformin  Home Medications   Prior to Admission medications   Medication Sig Start Date End Date Taking? Authorizing Provider  anastrozole (ARIMIDEX) 1 MG tablet TAKE 1 TABLET BY MOUTH DAILY 12/13/13   Marcelino Duster, NP  atorvastatin (LIPITOR) 10 MG tablet Take 1 tablet (10 mg total) by mouth at bedtime. 12/15/12   Othella Boyer, MD  conjugated estrogens (PREMARIN) vaginal cream Place vaginally daily. 1-1.5 gram(s) in the vagina daily 02/25/13   Otho Bellows, MD  DULoxetine (CYMBALTA) 30 MG capsule Take 1 capsule (30 mg total) by mouth daily. 11/02/13   Cresenciano Genre, MD  fluticasone (FLONASE) 50 MCG/ACT nasal spray Place 2 sprays into both nostrils daily. 05/06/13 05/06/14  Neema Bobbie Stack, MD  glipiZIDE (GLUCOTROL) 5 MG tablet Take 1 tablet (5 mg total) by mouth daily before breakfast. 05/25/13   Othella Boyer, MD  hydrochlorothiazide (HYDRODIURIL) 25 MG tablet Take 1 tablet (25 mg total) by mouth daily. 05/25/13   Neema Bobbie Stack, MD  lisinopril (PRINIVIL,ZESTRIL) 40 MG tablet Take 1 tablet (40 mg total) by mouth daily.    Othella Boyer, MD  metFORMIN (GLUCOPHAGE) 500 MG tablet Take 1 tablet (500 mg total) by mouth 2 (two) times daily with a meal. 09/02/13   Neema Bobbie Stack, MD  metoprolol succinate (TOPROL-XL) 50 MG 24 hr tablet Take 1 tablet (50 mg total) by mouth 2 (two) times daily. Take with or immediately following a meal. 03/24/13   Othella Boyer, MD  Polyvinyl Alcohol-Povidone (REFRESH OP) Place 1 drop into both eyes daily as needed. Dry eyes    Historical Provider, MD  sulfamethoxazole-trimethoprim (BACTRIM,SEPTRA) 400-80 MG per tablet Take 1  tablet by mouth 2 (two) times daily.    Historical Provider, MD   BP 170/75  Pulse 98  Temp(Src) 98.5 F (36.9 C) (Oral)  Resp 16  Ht 4\' 11"  (1.499 m)  Wt 162 lb (73.483 kg)  BMI 32.70 kg/m2  SpO2 100% Physical Exam  Nursing note and vitals reviewed. Constitutional: She appears well-developed and well-nourished. No distress.  HENT:  Head: Normocephalic and atraumatic.  Neck: Neck supple.  Cardiovascular: Normal rate and regular rhythm.   Pulmonary/Chest: Effort normal and breath  sounds normal. No respiratory distress. She has no wheezes. She has no rhonchi. She has no rales. She exhibits no tenderness.  Non-tender to palpation over left upper chest wall.  Musculoskeletal:  Non-tender to palpation over left shoulder.    Neurological: She is alert.  Skin: Rash noted. She is not diaphoretic.  Scattered hyperpigmented plaques with overlying scales.  Few scattered erythematous papules over posterior neck and few over bilateral upper extremities.  Single erythematous papule over right abdomen.  Single patch of hyperpigmented plaques with overlying scales in left upper back.  No associated erythema, edema, warmth, tenderness, or discharge to any of the lesions.  Left deltoid area without lesion.      ED Course  Procedures (including critical care time)  DIAGNOSTIC STUDIES: Oxygen Saturation is 100% on room air, normal by my interpretation.    COORDINATION OF CARE: 3:17 PM- Will refer the patient to an allergist for further examination and discharge with medication.  The patient agreed to the treatment plan.   Labs Review Labs Reviewed - No data to display  Imaging Review No results found.   EKG Interpretation None      MDM   Final diagnoses:  Rash    Afebrile, nontoxic patient with pruritic rash x 5 days, gradually worsening.  No swelling or itching in the mouth or throat, no SOB.  Only exposure may be flu vaccine received the day before the rash began.  There is no e/o  superinfection.   D/C home with prednisone x 5 days, benadryl. Allergist, PCP follow up.  Advised to monitor blood sugars closely.   Discussed result, findings, treatment, and follow up  with patient.  Pt given return precautions.  Pt verbalizes understanding and agrees with plan.       I personally performed the services described in this documentation, which was scribed in my presence. The recorded information has been reviewed and is accurate.    Clayton Bibles, PA-C 02/18/14 [phone]

## 2014-02-18 NOTE — Discharge Instructions (Signed)
Read the information below.  Use the prescribed medication as directed.  Please discuss all new medications with your pharmacist.  You may return to the Emergency Department at any time for worsening condition or any new symptoms that concern you.  If you develop redness, swelling, pus draining from the wound, or fevers greater than 100.4, return to the ER immediately for a recheck.    Monitor your blood sugars closely while on the prescribed medications.      Rash A rash is a change in the color or texture of your skin. There are many different types of rashes. You may have other problems that accompany your rash. CAUSES   Infections.  Allergic reactions. This can include allergies to pets or foods.  Certain medicines.  Exposure to certain chemicals, soaps, or cosmetics.  Heat.  Exposure to poisonous plants.  Tumors, both cancerous and noncancerous. SYMPTOMS   Redness.  Scaly skin.  Itchy skin.  Dry or cracked skin.  Bumps.  Blisters.  Pain. DIAGNOSIS  Your caregiver may do a physical exam to determine what type of rash you have. A skin sample (biopsy) may be taken and examined under a microscope. TREATMENT  Treatment depends on the type of rash you have. Your caregiver may prescribe certain medicines. For serious conditions, you may need to see a skin doctor (dermatologist). HOME CARE INSTRUCTIONS   Avoid the substance that caused your rash.  Do not scratch your rash. This can cause infection.  You may take cool baths to help stop itching.  Only take over-the-counter or prescription medicines as directed by your caregiver.  Keep all follow-up appointments as directed by your caregiver. SEEK IMMEDIATE MEDICAL CARE IF:  You have increasing pain, swelling, or redness.  You have a fever.  You have new or severe symptoms.  You have body aches, diarrhea, or vomiting.  Your rash is not better after 3 days. MAKE SURE YOU:  Understand these  instructions.  Will watch your condition.  Will get help right away if you are not doing well or get worse. Document Released: 04/11/2002 Document Revised: 07/14/2011 Document Reviewed: 02/03/2011 [facility] Patient Information 2015 [place], [place]. This information is not intended to replace advice given to you by your health care provider. Make sure you discuss any questions you have with your health care provider.

## 2014-02-19 NOTE — ED Provider Notes (Signed)
Medical screening examination/treatment/procedure(s) were performed by non-physician practitioner and as supervising physician I was immediately available for consultation/collaboration.  Artis Delay, MD 02/19/14 [phone]

## 2014-02-25 ENCOUNTER — Other Ambulatory Visit: Payer: Self-pay | Admitting: Nurse Practitioner

## 2014-03-01 ENCOUNTER — Ambulatory Visit (HOSPITAL_COMMUNITY)
Admission: RE | Admit: 2014-03-01 | Discharge: 2014-03-01 | Disposition: A | Discharge status: Home / Self Care | Payer: Medicare Other | Source: Ambulatory Visit | Attending: Internal Medicine | Admitting: Internal Medicine

## 2014-03-01 ENCOUNTER — Ambulatory Visit (INDEPENDENT_AMBULATORY_CARE_PROVIDER_SITE_OTHER): Payer: Medicare Other | Admitting: Internal Medicine

## 2014-03-01 ENCOUNTER — Encounter: Payer: Self-pay | Admitting: Internal Medicine

## 2014-03-01 VITALS — BP 156/81 | HR 93 | Temp 98.9°F | Ht 59.0 in | Wt 161.7 lb

## 2014-03-01 DIAGNOSIS — E1169 Type 2 diabetes mellitus with other specified complication: Secondary | ICD-10-CM | POA: Diagnosis not present

## 2014-03-01 DIAGNOSIS — M1611 Unilateral primary osteoarthritis, right hip: Secondary | ICD-10-CM | POA: Diagnosis not present

## 2014-03-01 DIAGNOSIS — E118 Type 2 diabetes mellitus with unspecified complications: Secondary | ICD-10-CM

## 2014-03-01 DIAGNOSIS — M79604 Pain in right leg: Secondary | ICD-10-CM

## 2014-03-01 DIAGNOSIS — I152 Hypertension secondary to endocrine disorders: Secondary | ICD-10-CM

## 2014-03-01 DIAGNOSIS — I10 Essential (primary) hypertension: Secondary | ICD-10-CM

## 2014-03-01 DIAGNOSIS — M160 Bilateral primary osteoarthritis of hip: Secondary | ICD-10-CM | POA: Insufficient documentation

## 2014-03-01 DIAGNOSIS — E119 Type 2 diabetes mellitus without complications: Secondary | ICD-10-CM

## 2014-03-01 DIAGNOSIS — E785 Hyperlipidemia, unspecified: Secondary | ICD-10-CM

## 2014-03-01 DIAGNOSIS — E1159 Type 2 diabetes mellitus with other circulatory complications: Secondary | ICD-10-CM

## 2014-03-01 LAB — POCT GLYCOSYLATED HEMOGLOBIN (HGB A1C): Hemoglobin A1C: 7.2

## 2014-03-01 LAB — GLUCOSE, CAPILLARY: Glucose-Capillary: 160 mg/dL — ABNORMAL HIGH (ref 70–99)

## 2014-03-01 NOTE — Patient Instructions (Addendum)
General Instructions:  Please try Tylenol, ice or heat as needed for your pain.  I will call you if there are abnormalities on your xray.  Return to see me in 1-2 weeks if you are still having symptoms or developing worsening symptoms.  Go to the ED if you develop severe pain, loss of control of your bowel/bladder, difficulty walking.    Please try to bring all your medicines next time. This will help [place] keep you safe from mistakes.   Progress Toward Treatment Goals:  Treatment Goal 03/01/2014  Hemoglobin A1C improved  Blood pressure -    Self Care Goals & Plans:  Self Care Goal 03/01/2014  Manage my medications take my medicines as prescribed; bring my medications to every visit; refill my medications on time  Monitor my health keep track of my blood glucose; bring my glucose meter and log to each visit  Eat healthy foods drink diet soda or water instead of juice or soda; eat more vegetables; eat foods that are low in salt; eat baked foods instead of fried foods; eat fruit for snacks and desserts  Be physically active -  Meeting treatment goals -    Home Blood Glucose Monitoring 11/02/2013  Check my blood sugar no home glucose monitoring  When to check my blood sugar N/A     Care Management & Community Referrals:  Referral 11/02/2013  Referrals made for care management support none needed  Referrals made to community resources none

## 2014-03-01 NOTE — Progress Notes (Signed)
   Subjective:    Patient ID: [name], female    DOB: October 05, 1945, 68 y.o.   MRN: [id number]  HPI Comments: [name] is a 68 y.o. woman with history of type 2 DM, HTN, HLD and breast cancer (s/p right mastectomy/lymph node dissection, 4 cycles of chemo, XRT) currently on Arimadex (x 1 year with plan for 5 year total trx).  She is here with a complaint of pain in her right hip radiating down to her foot.  She first noticed this while standing in the kitchen cooking yesterday morning.  Pain is 8/10, dull, feels like it is stabbing when it gets to the ankle.  The pain has been constant since yesterday.  She used linament cream which reduced the pain to 5/10.  She was able to sleep through the night.  She denies increased activity, trauma, back pain, abdominal pain, difficulty ambulating, loss of bowel or bladder control.  She has had this pain before; prior to right knee replacement last year.  She has been prescribed pain medication by the orthopedic surgeon in the past.  She is pain free during the exam but pain did return towards the end of the visit.       Review of Systems  Constitutional: Negative for appetite change.  Cardiovascular: Negative for chest pain and leg swelling.  Gastrointestinal: Negative for abdominal pain, constipation and blood in stool.  Endocrine: Negative for polyuria.  Genitourinary: Negative for dysuria.  Musculoskeletal: Negative for back pain.  Neurological: Negative for syncope, weakness and light-headedness.       Objective:   Physical Exam  Vitals reviewed. Constitutional: [name], place, and time. She appears well-developed. No distress.  HENT:  Head: Normocephalic and atraumatic.  Mouth/Throat: Oropharynx is clear and moist. No oropharyngeal exudate.  Eyes: EOM are normal. Pupils are equal, round, and reactive to light.  Cardiovascular: Normal rate, regular rhythm and normal heart sounds.  Exam reveals no gallop and no friction  rub.   No murmur heard. Pulmonary/Chest: Effort normal and breath sounds normal. No respiratory distress. She has no wheezes. She has no rales.  Abdominal: Soft. Bowel sounds are normal. She exhibits no distension and no mass. There is no tenderness. There is no rebound and no guarding.  Musculoskeletal: Normal range of motion. She exhibits no edema.  Her gait is normal; she has full ROM of both hips joints; negative straight leg raise; strength is equal and intact B/L upper and lower extremities.  Neurological: She is alert and oriented to person, place, and time. No cranial nerve deficit. She exhibits normal muscle tone.  Sensation is intact B/L upper and lower extremities; ankle reflexes are normal, dorsiflexion and plantarflexion equal and intact, toe proprioception normal.  Skin: Skin is warm. She is not diaphoretic. No erythema.  There are no rashes or lesions on the buttock/back of thighs/legs in the area where she has had the pain.  There is area of dry skin on left shoulder and back of neck.  Psychiatric: She has a normal mood and affect. Her behavior is normal.          Assessment & Plan:  Please see problem based assessment and plan.

## 2014-03-02 DIAGNOSIS — G5701 Lesion of sciatic nerve, right lower limb: Secondary | ICD-10-CM | POA: Insufficient documentation

## 2014-03-02 MED ORDER — GLIPIZIDE 5 MG PO TABS: 5.0000 mg | ORAL_TABLET | Freq: Every day | ORAL | Status: DC

## 2014-03-02 MED ORDER — HYDROCHLOROTHIAZIDE 25 MG PO TABS: 25.0000 mg | ORAL_TABLET | Freq: Every day | ORAL | Status: DC

## 2014-03-02 MED ORDER — ATORVASTATIN CALCIUM 10 MG PO TABS: 10.0000 mg | ORAL_TABLET | Freq: Every day | ORAL | Status: DC

## 2014-03-02 NOTE — Assessment & Plan Note (Signed)
Lab Results  Component Value Date   HGBA1C 7.2 03/01/2014   HGBA1C 7.7 09/02/2013   HGBA1C 7.2 05/25/2013     Assessment: Diabetes control: fair control Progress toward A1C goal:  improved Comments: on glipizide  Plan: Medications:  continue current medications:  Glipizide 5mg  daily. Home glucose monitoring: Frequency:   Timing:   Instruction/counseling given: reminded to bring blood glucose meter & log to each visit Educational resources provided: brochure (Pt refused information) Self management tools provided:   Other plans: follow-up in 3 months.

## 2014-03-02 NOTE — Assessment & Plan Note (Signed)
BP Readings from Last 3 Encounters:  03/01/14 156/81  02/18/14 149/84  12/13/13 131/61    Lab Results  Component Value Date   NA 141 12/06/2013   K 4.0 12/06/2013   CREATININE 1.4* 12/06/2013    Assessment: Blood pressure control: mildly elevated Progress toward BP goal:    Comments: She report compliance but has not taken BP meds yet today.  Plan: Medications:  continue current medications:  Lisinopril 40mg  daily, HCTZ 25mg  daily Educational resources provided: brochure Self management tools provided:   Other plans: return to clinic in 3 months; BMP next visit

## 2014-03-02 NOTE — Assessment & Plan Note (Addendum)
She reported hip pain but she has no pain in the anterior thigh/groin area which would be more c/w hip pathology.  Her pain starts below the right buttock and then travels down the posterior leg to the foot.  She is not tender to palpation of lateral thigh making trochanteric bursitis unlikely.   She has full ROM and strength on hip motion testing making hip pathology less likely.   She describes a radiating pain c/w with nerve root irritation, however there is no lumbar pain or sensory/motor deficits.  There are no neuro deficits or back pain making cauda equina unlikely.  There is no rash to suggest shingles.  She had a normal DEXA this year making occult fracture unlikely.  I am reassured by the fact that she has had this pain before related to knee OA, the pain improved with topical cream (linament), she has not had weight loss.  However, I will need to r/o mets given her breast cancer.  The differential still includes more benign causes like muscle strain, sciatica/piriformis syndrome or OA. - plain film hip - no acute abnormalities; mild B/L hip OA; mild DDD lower lumbar spine - I will speak with patient's heme/onc provider regarding suggestions for further imaging. - The patient will try Tylenol or application of heat for relief of pain when it occurs. - I have asked the patient to return in 1-2 weeks for follow-up if symptoms persists; she was advised to seek emergency care if she develops severe pain, neuro symptoms or difficulty walking.

## 2014-03-02 NOTE — Progress Notes (Signed)
Case discussed with Dr. Wilson soon after the resident saw the patient. We reviewed the resident's history and exam and pertinent patient test results. I agree with the assessment, diagnosis, and plan of care documented in the resident's note. 

## 2014-03-03 ENCOUNTER — Encounter (HOSPITAL_COMMUNITY): Payer: Self-pay | Admitting: Emergency Medicine

## 2014-03-03 ENCOUNTER — Emergency Department (HOSPITAL_COMMUNITY)
Admission: EM | Admit: 2014-03-03 | Discharge: 2014-03-03 | Disposition: A | Discharge status: Home / Self Care | Payer: Medicare Other | Attending: Emergency Medicine | Admitting: Emergency Medicine

## 2014-03-03 DIAGNOSIS — I10 Essential (primary) hypertension: Secondary | ICD-10-CM | POA: Insufficient documentation

## 2014-03-03 DIAGNOSIS — M25551 Pain in right hip: Secondary | ICD-10-CM | POA: Diagnosis not present

## 2014-03-03 DIAGNOSIS — Z79899 Other long term (current) drug therapy: Secondary | ICD-10-CM | POA: Insufficient documentation

## 2014-03-03 DIAGNOSIS — Z853 Personal history of malignant neoplasm of breast: Secondary | ICD-10-CM | POA: Insufficient documentation

## 2014-03-03 DIAGNOSIS — E669 Obesity, unspecified: Secondary | ICD-10-CM | POA: Insufficient documentation

## 2014-03-03 DIAGNOSIS — E119 Type 2 diabetes mellitus without complications: Secondary | ICD-10-CM | POA: Diagnosis not present

## 2014-03-03 DIAGNOSIS — Z8719 Personal history of other diseases of the digestive system: Secondary | ICD-10-CM | POA: Diagnosis not present

## 2014-03-03 DIAGNOSIS — E785 Hyperlipidemia, unspecified: Secondary | ICD-10-CM | POA: Diagnosis not present

## 2014-03-03 DIAGNOSIS — G8911 Acute pain due to trauma: Secondary | ICD-10-CM | POA: Diagnosis not present

## 2014-03-03 DIAGNOSIS — Z7951 Long term (current) use of inhaled steroids: Secondary | ICD-10-CM | POA: Insufficient documentation

## 2014-03-03 DIAGNOSIS — M1990 Unspecified osteoarthritis, unspecified site: Secondary | ICD-10-CM | POA: Insufficient documentation

## 2014-03-03 DIAGNOSIS — Z87448 Personal history of other diseases of urinary system: Secondary | ICD-10-CM | POA: Diagnosis not present

## 2014-03-03 MED ORDER — HYDROCODONE-ACETAMINOPHEN 5-325 MG PO TABS: 1.0000 | ORAL_TABLET | Freq: Four times a day (QID) | ORAL | Status: DC | PRN

## 2014-03-03 NOTE — Discharge Instructions (Signed)

## 2014-03-03 NOTE — ED Provider Notes (Signed)
Medical screening examination/treatment/procedure(s) were performed by non-physician practitioner and as supervising physician I was immediately available for consultation/collaboration.  Richarda Blade, MD 03/03/14 [phone]

## 2014-03-03 NOTE — ED Provider Notes (Signed)
CSN: [id number]     Arrival date & time 03/03/14  1147 History  This chart was scribed for non-physician practitioner, Lorre Munroe, PA-C,working with Richarda Blade, MD, by Marlowe Kays, ED Scribe. This patient was seen in room TR06C/TR06C and the patient's care was started at 11:57 AM.  Chief Complaint  Patient presents with  . Hip Pain   Patient is a 68 y.o. female presenting with hip pain. The history is provided by the patient. No language interpreter was used.  Hip Pain   HPI Comments:  [name] is a 68 y.o. obese female with PMH of HTN, hyperlipidemia, DM and breast cancer who presents to the Emergency Department complaining of severe right hip pain that began approximately one week ago. She states she went to sit down and missed the chair and fell to the ground. Pt was seen at [facility] and received a negative X-Ray with only bilateral osteoarthritic hip changes. She was told to take Tylenol which she has been doing with no significant relief of the pain. She has also been applying a topical cream without relief as well. She reports walking makes the pain worse. Denies any alleviating factors. Denies numbness, tingling or weakness or the lower extremities, fever, chills, nausea, vomiting or bowel or bladder incontinence.  Past Medical History  Diagnosis Date  . Allergy   . Hypertension   . Hyperlipidemia   . Fatty liver disease, nonalcoholic   . History of hyperkalemia   . Obesity   . Renal lesion   . Hx antineoplastic chemo  10/12 - 06/24/11    PACLITAX EL COMPLETED 06/24/11  . Anemia   . Diabetes mellitus ORAL MED  . OA (osteoarthritis) of knee     RIGHT LEG  . Mild nonproliferative diabetic retinopathy of right eye 03/19/11    Dr. Joseph Art  . Breast CA     (Rt) breast ca dx 4/12---  S/P RADICAL MASTECTOMY AND CHEMORADIATION  . Numbness and tingling     Hx: of in fingers and toes since chemotherapy   Past Surgical History  Procedure Laterality  Date  . Knee arthroscopy  10-28-1999    RIGHT  . Mastectomy modified radical  12-04-2010    W/ LEFT PAC PLACEMENT (RIGHT BREAST W/ AXILLARY CONTENTS/ NODE BX'S  . Transthoracic echocardiogram  10-29-2010    NORMAL LVF/ EF 55-60%/ MILD MV REGURG  . Vaginal hysterectomy  AGE 27    W/ LSO  . Tubal ligation  YRS AGO  . Laparoscopy with laparoscopic right salpingo oophorectomy and lysis of pelvic and abdominal adhesions  March 2013    Dr. Nori Riis   . Portacath placement      REPLACED [facility] DUE TO MALFUNCTION (LEFT)  . Port-a-cath removal  09/24/2011    Procedure: REMOVAL PORT-A-CATH;  Surgeon: Joyice Faster. Cornett, MD;  Location: [facility];  Service: General;  Laterality: N/A;  . Colonoscopy w/ polypectomy      Hx; of  . Total knee arthroplasty Right 08/16/2012    Procedure: TOTAL KNEE ARTHROPLASTY;  Surgeon: Kerin Salen, MD;  Location: [place];  Service: Orthopedics;  Laterality: Right;  . I&d knee with poly exchange Right 09/03/2012    Procedure: IRRIGATION AND DEBRIDEMENT RIGHT KNEE WITH POLY EXCHANGE;  Surgeon: Kerin Salen, MD;  Location: [place];  Service: Orthopedics;  Laterality: Right;   Family History  Problem Relation Age of Onset  . Stroke Father   . Heart disease Sister   . Cancer Sister  breast  . Heart disease Brother   . Cancer Sister     COLON CANCER   History  Substance Use Topics  . Smoking status: Never Smoker   . Smokeless tobacco: Never Used  . Alcohol Use: No   OB History   Grav Para Term Preterm Abortions TAB SAB Ect Mult Living   4 4 4       4      Review of Systems  Constitutional: Negative for fever and chills.  Gastrointestinal: Negative for nausea and vomiting.  Genitourinary:       No bowel or bladder incontinence.  Musculoskeletal: Positive for arthralgias.  Skin: Negative for wound.  Neurological: Negative for weakness and numbness.    Allergies  Metformin  Home Medications   Prior to Admission medications   Medication Sig Start Date End Date  Taking? Authorizing Provider  anastrozole (ARIMIDEX) 1 MG tablet TAKE 1 TABLET BY MOUTH DAILY 12/13/13   Marcelino Duster, NP  atorvastatin (LIPITOR) 10 MG tablet Take 1 tablet (10 mg total) by mouth at bedtime. 03/02/14   Francesca Oman, DO  diphenhydrAMINE (BENADRYL) 25 MG tablet Take 1 tablet (25 mg total) by mouth every 6 (six) hours as needed for itching or allergies. For itching and rash 02/18/14   Clayton Bibles, PA-C  DULoxetine (CYMBALTA) 30 MG capsule Take 1 capsule (30 mg total) by mouth daily. 11/02/13   Cresenciano Genre, MD  fluticasone (FLONASE) 50 MCG/ACT nasal spray Place 2 sprays into both nostrils daily. 05/06/13 05/06/14  Othella Boyer, MD  glipiZIDE (GLUCOTROL) 5 MG tablet Take 1 tablet (5 mg total) by mouth daily before breakfast. 03/02/14   Francesca Oman, DO  hydrochlorothiazide (HYDRODIURIL) 25 MG tablet Take 1 tablet (25 mg total) by mouth daily. 03/02/14   Francesca Oman, DO  lisinopril (PRINIVIL,ZESTRIL) 40 MG tablet Take 1 tablet (40 mg total) by mouth daily.    Othella Boyer, MD  metFORMIN (GLUCOPHAGE) 500 MG tablet Take 1 tablet (500 mg total) by mouth 2 (two) times daily with a meal. 09/02/13   Othella Boyer, MD  Polyvinyl Alcohol-Povidone (REFRESH OP) Place 1 drop into both eyes daily as needed. Dry eyes    Historical Provider, MD   Triage Vitals: BP 171/97  Pulse 85  Temp(Src) 98.4 F (36.9 C) (Oral)  Resp 18  Ht 4\' 11"  (1.499 m)  Wt 161 lb (73.029 kg)  BMI 32.50 kg/m2  SpO2 100% Physical Exam  Nursing note and vitals reviewed. Constitutional: [name], place, and time. She appears well-developed and well-nourished.  HENT:  Head: Normocephalic and atraumatic.  Eyes: EOM are normal.  Neck: Normal range of motion.  Cardiovascular: Normal rate.   Pulmonary/Chest: Effort normal.  Musculoskeletal: Normal range of motion.  No bony abnormality or deformity of right hip, ROM and strength 5/5, normal gait.  Neurological: She is alert and oriented to person,  place, and time.  Skin: Skin is warm and dry.  Psychiatric: She has a normal mood and affect. Her behavior is normal.    ED Course  Procedures (including critical care time) DIAGNOSTIC STUDIES: Oxygen Saturation is 100% on RA, normal by my interpretation.   COORDINATION OF CARE: 12:03 PM- Will prescribe pain medication and advised her to follow up with orthopedist. Pt verbalizes understanding and agrees to plan.  Medications - No data to display  Labs Review Labs Reviewed - No data to display  Imaging Review Dg Hip Complete Right  03/01/2014   CLINICAL DATA:  Pain in right hip radiating down ankle in into foot.  EXAM: RIGHT HIP - COMPLETE 2+ VIEW  COMPARISON:  None.  FINDINGS: There is mild bilateral hip joint space narrowing and marginal spur formation. No fracture or dislocation identified. Mild degenerative disc disease noted in the lower lumbar spine.  IMPRESSION: 1. Mild bilateral hip osteoarthritis. 2. No acute findings.   Electronically Signed   By: Kerby Moors M.D.   On: 03/01/2014 16:57     EKG Interpretation None      MDM   Final diagnoses:  Hip pain, right    Patient with chronic right hip pain.  Osteoarthritis on plain film.  Recommend orthopedic follow up.  Patient is ambulatory.  I personally performed the services described in this documentation, which was scribed in my presence. The recorded information has been reviewed and is accurate.    Montine Circle, PA-C 03/03/14 1247

## 2014-03-03 NOTE — ED Notes (Signed)
pt complaining of right hip pain radiating to foot x a few days. sts that she had xray done at [facility] but no results.

## 2014-03-06 ENCOUNTER — Encounter (HOSPITAL_COMMUNITY): Payer: Self-pay | Admitting: Emergency Medicine

## 2014-03-06 ENCOUNTER — Ambulatory Visit (INDEPENDENT_AMBULATORY_CARE_PROVIDER_SITE_OTHER): Payer: Medicare Other | Admitting: Internal Medicine

## 2014-03-06 VITALS — BP 127/76 | HR 125 | Temp 98.3°F | Ht 59.0 in | Wt 153.9 lb

## 2014-03-06 DIAGNOSIS — I10 Essential (primary) hypertension: Secondary | ICD-10-CM

## 2014-03-06 DIAGNOSIS — I152 Hypertension secondary to endocrine disorders: Secondary | ICD-10-CM

## 2014-03-06 DIAGNOSIS — L259 Unspecified contact dermatitis, unspecified cause: Secondary | ICD-10-CM | POA: Diagnosis not present

## 2014-03-06 DIAGNOSIS — J309 Allergic rhinitis, unspecified: Secondary | ICD-10-CM | POA: Diagnosis not present

## 2014-03-06 DIAGNOSIS — G5701 Lesion of sciatic nerve, right lower limb: Secondary | ICD-10-CM | POA: Diagnosis not present

## 2014-03-06 DIAGNOSIS — E0822 Diabetes mellitus due to underlying condition with diabetic chronic kidney disease: Secondary | ICD-10-CM | POA: Diagnosis not present

## 2014-03-06 DIAGNOSIS — E1159 Type 2 diabetes mellitus with other circulatory complications: Secondary | ICD-10-CM

## 2014-03-06 MED ORDER — IBUPROFEN 600 MG PO TABS: 600.0000 mg | ORAL_TABLET | Freq: Three times a day (TID) | ORAL | Status: AC | PRN

## 2014-03-06 MED ORDER — CYCLOBENZAPRINE HCL 5 MG PO TABS: 5.0000 mg | ORAL_TABLET | Freq: Three times a day (TID) | ORAL | Status: AC | PRN

## 2014-03-06 NOTE — Progress Notes (Signed)
Subjective:   Patient ID: [name] female   DOB: 1945-10-13 68 y.o.   MRN: [id number]  HPI: [name] is a 68 y.o. female with PMH as listed below presenting to opc today for follow up visit of R hip pain pain.  R hip/buttock pain--slightly improved since last visit with PCP last week. Tylenol was prescribed at that time with no relief so she went to the ED and was given percocet which does give her temporary relief. Pain currently 7/10, localized to right buttock, achy, constant, and no longer radiating down right leg as it did last week. She has tried epsom salt baths to help and rubbing liniment cream on it with massage to area by husband that was very painful. Denies any injury to area or recent fall. Had similar pain like this in her knees prior to repair. Mild b/l hip OA on R hip xray 03/01/14. Almost out of percocet from ED.   Past Medical History  Diagnosis Date  . Allergy   . Hypertension   . Hyperlipidemia   . Fatty liver disease, nonalcoholic   . History of hyperkalemia   . Obesity   . Renal lesion   . Hx antineoplastic chemo  10/12 - 06/24/11    PACLITAX EL COMPLETED 06/24/11  . Anemia   . Diabetes mellitus ORAL MED  . OA (osteoarthritis) of knee     RIGHT LEG  . Mild nonproliferative diabetic retinopathy of right eye 03/19/11    Dr. Joseph Art  . Breast CA     (Rt) breast ca dx 4/12---  S/P RADICAL MASTECTOMY AND CHEMORADIATION  . Numbness and tingling     Hx: of in fingers and toes since chemotherapy   Current Outpatient Prescriptions  Medication Sig Dispense Refill  . anastrozole (ARIMIDEX) 1 MG tablet TAKE 1 TABLET BY MOUTH DAILY 30 tablet 7  . glipiZIDE (GLUCOTROL) 5 MG tablet Take 1 tablet (5 mg total) by mouth daily before breakfast. 30 tablet 3  . HYDROcodone-acetaminophen (NORCO/VICODIN) 5-325 MG per tablet Take 1-2 tablets by mouth every 6 (six) hours as needed. 10 tablet 0  . lisinopril (PRINIVIL,ZESTRIL) 40 MG tablet Take 1 tablet (40 mg  total) by mouth daily. 30 tablet 3  . Polyvinyl Alcohol-Povidone (REFRESH OP) Place 1 drop into both eyes daily as needed. Dry eyes    . atorvastatin (LIPITOR) 10 MG tablet Take 1 tablet (10 mg total) by mouth at bedtime. 90 tablet 3  . cyclobenzaprine (FLEXERIL) 5 MG tablet Take 1 tablet (5 mg total) by mouth every 8 (eight) hours as needed for muscle spasms. 15 tablet 0  . diphenhydrAMINE (BENADRYL) 25 MG tablet Take 1 tablet (25 mg total) by mouth every 6 (six) hours as needed for itching or allergies. For itching and rash 20 tablet 0  . DULoxetine (CYMBALTA) 30 MG capsule Take 1 capsule (30 mg total) by mouth daily. 30 capsule 1  . fluticasone (FLONASE) 50 MCG/ACT nasal spray Place 2 sprays into both nostrils daily. 16 g 2  . hydrochlorothiazide (HYDRODIURIL) 25 MG tablet Take 1 tablet (25 mg total) by mouth daily. 90 tablet 3  . ibuprofen (ADVIL,MOTRIN) 600 MG tablet Take 1 tablet (600 mg total) by mouth every 8 (eight) hours as needed for moderate pain. 15 tablet 0  . metFORMIN (GLUCOPHAGE) 500 MG tablet Take 1 tablet (500 mg total) by mouth 2 (two) times daily with a meal. 60 tablet 5   No current facility-administered medications for this visit.  Family History  Problem Relation Age of Onset  . Stroke Father   . Heart disease Sister   . Cancer Sister     breast  . Heart disease Brother   . Cancer Sister     COLON CANCER   History   Social History  . Marital Status: Married    Spouse Name: N/A    Number of Children: N/A  . Years of Education: 21   Social History Main Topics  . Smoking status: Never Smoker   . Smokeless tobacco: Never Used  . Alcohol Use: No  . Drug Use: Yes    Special: Flunitrazepam  . Sexual Activity: Yes    Birth Control/ Protection: Surgical     Comment: MENARCHE AGE 65, G4, P4, PARITY AGE 78, HRT 20+ YEARS     Other Topics Concern  . 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  Patient needs to submit further paperwork to complete- Bonna Gains 08/14/09.   Financial assistance application completed.  Patient does not qualify for [employer] 09/12/09.            Review of Systems:  Constitutional:  Occasional chills   HEENT:  Denies congestion  Respiratory:  Denies SOB   Cardiovascular:  Denies chest pain   Gastrointestinal:  Denies nausea, vomiting, abdominal pain  Genitourinary:  Denies dysuria   Musculoskeletal:  Gait problem, R hip/buttock pain  Skin:  Denies pallor and wound.   Neurological:  Denies syncope or fall   Objective:  Physical Exam: Filed Vitals:   03/06/14 1516  BP: 127/76  Pulse: 125  Temp: 98.3 F (36.8 C)  TempSrc: Oral  Height: 4\' 11"  (1.499 m)  Weight: 153 lb 14.4 oz (69.809 kg)  SpO2: 100%   Vitals reviewed. General: sitting in chair, acute distress due to pain HEENT: EOMI Cardiac: Tachycardia Pulm: clear to auscultation bilaterally, no wheezes, rales, or rhonchi Abd: soft, nontender, nondistended, BS present Ext: warm and well perfused, moving all 4 extremities, minimal discomfort R buttock/lower back with straight leg raise, -tenderness to palpation of R hip and leg, no point tenderness to buttock and no visible rash. Flexion and extension of back intact with lateral rotation. Mild discomfort with extension and bending to right side.  Neuro: alert and oriented X3, strength and sensation to light touch equal in bilateral upper and lower extremities, slight limp on right side due to pain when walking  Assessment & Plan:  Discussed with Dr. Daryll Drown Trial of NSAIDs and Flexeril

## 2014-03-06 NOTE — Assessment & Plan Note (Signed)
BP Readings from Last 3 Encounters:  03/06/14 127/76  03/03/14 171/97  03/01/14 156/81    Lab Results  Component Value Date   NA 141 12/06/2013   K 4.0 12/06/2013   CREATININE 1.4* 12/06/2013    Assessment: Blood pressure control: controlled Progress toward BP goal:  at goal Plan: Medications:  continue current medications lisinopril 40mg  and hctz 25mg  daily

## 2014-03-06 NOTE — Assessment & Plan Note (Signed)
Lab Results  Component Value Date   HGBA1C 7.2 03/01/2014   HGBA1C 7.7 09/02/2013   HGBA1C 7.2 05/25/2013     Assessment: Diabetes control: fair control Progress toward A1C goal:  improved  Plan: Medications:  continue current medications glipizide 5mg  daily Home glucose monitoring: Frequency:   Timing:   Instruction/counseling given: reminded to bring blood glucose meter & log to each visit and reminded to bring medications to each visit

## 2014-03-06 NOTE — Assessment & Plan Note (Addendum)
x1 week, pain slightly improving, no longer radiating down right leg, but still constant, achy, and localized to mid right buttock. No relief with tylenol but percocet did help. Denies fever, but has chronic intermittent chills. Very tachy today likely in setting pain  Will do limited day trial of NSAIDs given renal failure--ibuprofen 600mg  TID prn x4-5 days and flexeril 5mg  q8h prn as well RTC Friday for follow up to see if improving and check renal function Provided literature on stretches in the meantime Could consider PT vs. Sports medicine if no improvement Recheck vitals on Friday, hopefully HR has come down with pain control

## 2014-03-06 NOTE — Patient Instructions (Addendum)
General Instructions:  Please bring your medicines with you each time you come to clinic.  Medicines may include prescription medications, over-the-counter medications, herbal remedies, eye drops, vitamins, or other pills.   Please take ibuprofen 600mg  up to three times a day and you may also take flexeril (muscle relaxer) up to three times a day 1 tablet (5mg ). Try this until Friday and return to clinic for follow up. Return sooner if no better or worsening.   Please do not drive or operate heavy machinery while on flexeril as this may make your drowsy  Progress Toward Treatment Goals:  Treatment Goal 03/06/2014  Hemoglobin A1C improved  Blood pressure at goal  Prevent falls at goal    Self Care Goals & Plans:  Self Care Goal 03/06/2014  Manage my medications take my medicines as prescribed; bring my medications to every visit; refill my medications on time  Monitor my health keep track of my blood glucose; bring my glucose meter and log to each visit  Eat healthy foods eat more vegetables; eat foods that are low in salt; eat baked foods instead of fried foods  Be physically active -  Meeting treatment goals -    Home Blood Glucose Monitoring 11/02/2013  Check my blood sugar no home glucose monitoring  When to check my blood sugar N/A    Care Management & Community Referrals:  Referral 11/02/2013  Referrals made for care management support none needed  Referrals made to community resources none    Piriformis Syndrome with Rehab Piriformis syndrome is a condition the affects the nervous system in the area of the hip, and is characterized by pain and possibly a loss of feeling in the backside (posterior) thigh that may extend down the entire length of the leg. The symptoms are caused by an increase in pressure on the sciatic nerve by the piriformis muscle, which is on the back of the hip and is responsible for externally rotating the hip. The sciatic nerve and its branches connect to  much of the leg. Normally the sciatic nerve runs between the piriformis muscle and other muscles. However, in certain individuals the nerve runs through the muscle, which causes an increase in pressure on the nerve and results in the symptoms of piriformis syndrome. SYMPTOMS   Pain, tingling, numbness, or burning in the back of the thigh that may also extend down the entire leg.  Occasionally, tenderness in the buttock.  Loss of function of the leg.  Pain that worsens when using the piriformis muscle (running, jumping, or stairs).  Pain that increases with prolonged sitting.  Pain that is lessened by lying flat on the back. CAUSES   Piriformis syndrome is the result of an increase in pressure placed on the sciatic nerve. Oftentimes, piriformis syndrome is an overuse injury.  Stress placed on the nerve from a sudden increase in the intensity, frequency, or duration of training.  Compensation of other extremity injuries. RISK INCREASES WITH:  Sports that involve the piriformis muscle (running, walking, or jumping).  You are born with (congenital) a defect in which the sciatic nerve passes through the muscle. PREVENTION  Warm up and stretch properly before activity.  Allow for adequate recovery between workouts.  Maintain physical fitness:  Strength, flexibility, and endurance.  Cardiovascular fitness. PROGNOSIS  If treated properly, the symptoms of piriformis syndrome usually resolve in 2 to 6 weeks. RELATED COMPLICATIONS   Persistent and possibly permanent pain and numbness in the lower extremity.  Weakness of the extremity  that may progress to disability and inability to compete. TREATMENT  The most effective treatment for piriformis syndrome is rest from any activities that aggravate the symptoms. Ice and pain medication may help reduce pain and inflammation. The use of strengthening and stretching exercises may help reduce pain with activity. These exercises may be  performed at home or with a therapist. A referral to a therapist may be given for further evaluation and treatment, such as ultrasound. Corticosteroid injections may be given to reduce inflammation that is causing pressure to be placed on the sciatic nerve. If nonsurgical (conservative) treatment is unsuccessful, then surgery may be recommended.  MEDICATION   If pain medication is necessary, then nonsteroidal anti-inflammatory medications, such as aspirin and ibuprofen, or other minor pain relievers, such as acetaminophen, are often recommended.  Do not take pain medication for 7 days before surgery.  Prescription pain relievers may be given if deemed necessary by your caregiver. Use only as directed and only as much as you need.  Corticosteroid injections may be given by your caregiver. These injections should be reserved for the most serious cases, because they may only be given a certain number of times. HEAT AND COLD:   Cold treatment (icing) relieves pain and reduces inflammation. Cold treatment should be applied for 10 to 15 minutes every 2 to 3 hours for inflammation and pain and immediately after any activity that aggravates your symptoms. Use ice packs or massage the area with a piece of ice (ice massage).  Heat treatment may be used prior to performing the stretching and strengthening activities prescribed by your caregiver, physical therapist, or athletic trainer. Use a heat pack or soak the injury in warm water. SEEK IMMEDIATE MEDICAL CARE IF:  Treatment seems to offer no benefit, or the condition worsens.  Any medications produce adverse side effects. EXERCISES RANGE OF MOTION (ROM) AND STRETCHING EXERCISES - Piriformis Syndrome These exercises may help you when beginning to rehabilitate your injury. Your symptoms may resolve with or without further involvement from your physician, physical therapist, or athletic trainer. While completing these exercises, remember:   Restoring  tissue flexibility helps normal motion to return to the joints. This allows healthier, less painful movement and activity.  An effective stretch should be held for at least 30 seconds.  A stretch should never be painful. You should only feel a gentle lengthening or release in the stretched tissue. STRETCH - Hip Rotators  Lie on your back on a firm surface. Grasp your right / left knee with your right / left hand and your ankle with your opposite hand.  Keeping your hips and shoulders firmly planted, gently pull your right / left knee and rotate your lower leg toward your opposite shoulder until you feel a stretch in your buttocks.  Hold this stretch for __________ seconds. Repeat this stretch __________ times. Complete this stretch __________ times per day. STRETCH - Iliotibial Band  On the floor or bed, lie on your side so your right / left leg is on top. Bend your knee and grab your ankle.  Slowly bring your knee back so that your thigh is in line with your trunk. Keep your heel at your buttocks and gently arch your back so your head, shoulders, and hips line up.  Slowly lower your leg so that your knee approaches the floor/bed until you feel a gentle stretch on the outside of your right / left thigh. If you do not feel a stretch and your knee will not fall  farther, place the heel of your opposite foot on top of your knee and pull your thigh down farther.  Hold this stretch for __________ seconds. Repeat __________ times. Complete __________ times per day. STRENGTHENING EXERCISES - Piriformis Syndrome  These are some of the caregiver again or until your symptoms are resolved. Remember:   Strong muscles with good endurance tolerate stress better.  Do the exercises as initially prescribed by your caregiver. Progress slowly with each exercise, gradually increasing the number of repetitions and weight used under their guidance. STRENGTH - Hip Abductors, Straight Leg Raises Be aware of  your form throughout the entire exercise so that you exercise the correct muscles. Sloppy form means that you are not strengthening the correct muscles.  Lie on your side so that your head, shoulders, knee, and hip line up. You may bend your lower knee to help maintain your balance. Your right / left leg should be on top.  Roll your hips slightly forward, so that your hips are stacked directly over each other and your right / left knee is facing forward.  Lift your top leg up 4-6 inches, leading with your heel. Be sure that your foot does not drift forward or that your knee does not roll toward the ceiling.  Hold this position for __________ seconds. You should feel the muscles in your outer hip lifting (you may not notice this until your leg begins to tire).  Slowly lower your leg to the starting position. Allow the muscles to fully relax before beginning the next repetition. Repeat __________ times. Complete this exercise __________ times per day.  STRENGTH - Hip Abductors, Quadruped  On a firm, lightly padded surface, position yourself on your hands and knees. Your hands should be directly below your shoulders and your knees should be directly below your hips.  Keeping your right / left knee bent, lift your leg out to the side. Keep your legs level and in line with your shoulders.  Position yourself on your hands and knees.  Hold for __________ seconds.  Keeping your trunk steady and your hips level, slowly lower your leg to the starting position. Repeat __________ times. Complete this exercise __________ times per day.  STRENGTH - Hip Abductors, Standing  Tie one end of a rubber exercise band/tubing to a secure surface (table, pole) and tie a loop at the other end.  Place the loop around your right / left ankle. Keeping your ankle with the band directly opposite of the secured end, step away until there is tension in the tube/band.  Hold onto a chair as needed for  balance.  Keeping your back upright, your shoulders over your hips, and your toes pointing forward, lift your right / left leg out to your side. Be sure to lift your leg with your hip muscles. Do not "throw" your leg or tip your body to lift your leg.  Slowly and with control, return to the starting position. Repeat exercise __________ times. Complete this exercise __________ times per day.  Document Released: 04/21/2005 Document Revised: 09/05/2013 Document Reviewed: 08/03/2008 [facility] Patient Information 2015 [place], [place]. This information is not intended to replace advice given to you by your health care provider. Make sure you discuss any questions you have with your health care provider.  Cyclobenzaprine tablets What is this medicine? CYCLOBENZAPRINE (sye kloe BEN za preen) is a muscle relaxer. It is used to treat muscle pain, spasms, and stiffness. This medicine may be used for other purposes; ask your health care  provider or pharmacist if you have questions. COMMON BRAND NAME(S): Fexmid, Flexeril What should I tell my health care provider before I take this medicine? They need to know if you have any of these conditions: -heart disease, irregular heartbeat, or previous heart attack -liver disease -thyroid problem -an unusual or allergic reaction to cyclobenzaprine, tricyclic antidepressants, lactose, other medicines, foods, dyes, or preservatives -pregnant or trying to get pregnant -breast-feeding How should I use this medicine? Take this medicine by mouth with a glass of water. Follow the directions on the prescription label. If this medicine upsets your stomach, take it with food or milk. Take your medicine at regular intervals. Do not take it more often than directed. Talk to your pediatrician regarding the use of this medicine in children. Special care may be needed. Overdosage: If you think you have taken too much of this medicine contact a poison control center or emergency  room at once. NOTE: This medicine is only for you. Do not share this medicine with others. What if I miss a dose? If you miss a dose, take it as soon as you can. If it is almost time for your next dose, take only that dose. Do not take double or extra doses. What may interact with this medicine? Do not take this medicine with any of the following medications: -certain medicines for fungal infections like fluconazole, itraconazole, ketoconazole, posaconazole, voriconazole -cisapride -dofetilide -dronedarone -droperidol -flecainide -grepafloxacin -halofantrine -levomethadyl -MAOIs like Carbex, Eldepryl, Marplan, Nardil, and Parnate -nilotinib -pimozide -probucol -sertindole -thioridazine -ziprasidone This medicine may also interact with the following medications: -abarelix -alcohol -certain medicines for cancer -certain medicines for depression, anxiety, or psychotic disturbances -certain medicines for infection like alfuzosin, chloroquine, clarithromycin, levofloxacin, mefloquine, pentamidine, troleandomycin -certain medicines for an irregular heart beat -certain medicines used for sleep or numbness during surgery or procedure -contrast dyes -dolasetron -guanethidine -methadone -octreotide -ondansetron -other medicines that prolong the QT interval (cause an abnormal heart rhythm) -palonosetron -phenothiazines like chlorpromazine, mesoridazine, prochlorperazine, thioridazine -tramadol -vardenafil This list may not describe all possible interactions. Give your health care provider a list of all the medicines, herbs, non-prescription drugs, or dietary supplements you use. Also tell them if you smoke, drink alcohol, or use illegal drugs. Some items may interact with your medicine. What should I watch for while using this medicine? Check with your doctor or health care professional if your condition does not improve within 1 to 3 weeks. You may get drowsy or dizzy when you first  start taking the medicine or change doses. Do not drive, use machinery, or do anything that may be dangerous until you know how the medicine affects you. Stand or sit up slowly. Your mouth may get dry. Drinking water, chewing sugarless gum, or sucking on hard candy may help. What side effects may I notice from receiving this medicine? Side effects that you should report to your doctor or health care professional as soon as possible: -allergic reactions like skin rash, itching or hives, swelling of the face, lips, or tongue -chest pain -fast heartbeat -hallucinations -seizures -vomiting Side effects that usually do not require medical attention (report to your doctor or health care professional if they continue or are bothersome): -headache This list may not describe all possible side effects. Call your doctor for medical advice about side effects. You may report side effects to FDA at 1-800-FDA-1088. Where should I keep my medicine? Keep out of the reach of children. Store at room temperature between 15 and 30 degrees C (59  and 86 degrees F). Keep container tightly closed. Throw away any unused medicine after the expiration date. NOTE: This sheet is a summary. It may not cover all possible information. If you have questions about this medicine, talk to your doctor, pharmacist, or health care provider.  2015, [employer]vier/Gold Standard. (2012-11-16 12:48:19)  Ibuprofen tablets and capsules What is this medicine? IBUPROFEN (eye BYOO proe fen) is a non-steroidal anti-inflammatory drug (NSAID). It is used for dental pain, fever, headaches or migraines, osteoarthritis, rheumatoid arthritis, or painful monthly periods. It can also relieve minor aches and pains caused by a cold, flu, or sore throat. This medicine may be used for other purposes; ask your health care provider or pharmacist if you have questions. COMMON BRAND NAME(S): Advil, Advil Junior Strength, Advil Migraine, Genpril, Ibren, IBU,  Midol, Midol Cramps and Body Aches, Motrin, Motrin IB, Motrin Junior Strength, Motrin Migraine Pain, Samson-8, Toxicology Saliva Collection What should I tell my health care provider before I take this medicine? They need to know if you have any of these conditions: -asthma -cigarette smoker -drink more than 3 alcohol containing drinks a day -heart disease or circulation problems such as heart failure or leg edema (fluid retention) -high blood pressure -kidney disease -liver disease -stomach bleeding or ulcers -an unusual or allergic reaction to ibuprofen, aspirin, other NSAIDS, other medicines, foods, dyes, or preservatives -pregnant or trying to get pregnant -breast-feeding How should I use this medicine? Take this medicine by mouth with a glass of water. Follow the directions on the prescription label. Take this medicine with food if your stomach gets upset. Try to not lie down for at least 10 minutes after you take the medicine. Take your medicine at regular intervals. Do not take your medicine more often than directed. A special MedGuide will be given to you by the pharmacist with each prescription and refill. Be sure to read this information carefully each time. Talk to your pediatrician regarding the use of this medicine in children. Special care may be needed. Overdosage: If you think you have taken too much of this medicine contact a poison control center or emergency room at once. NOTE: This medicine is only for you. Do not share this medicine with others. What if I miss a dose? If you miss a dose, take it as soon as you can. If it is almost time for your next dose, take only that dose. Do not take double or extra doses. What may interact with this medicine? Do not take this medicine with any of the following medications: -cidofovir -ketorolac -methotrexate -pemetrexed This medicine may also interact with the following  medications: -alcohol -aspirin -diuretics -lithium -other drugs for inflammation like prednisone -warfarin This list may not describe all possible interactions. Give your health care provider a list of all the medicines, herbs, non-prescription drugs, or dietary supplements you use. Also tell them if you smoke, drink alcohol, or use illegal drugs. Some items may interact with your medicine. What should I watch for while using this medicine? Tell your doctor or healthcare professional if your symptoms do not start to get better or if they get worse. This medicine does not prevent heart attack or stroke. In fact, this medicine may increase the chance of a heart attack or stroke. The chance may increase with longer use of this medicine and in people who have heart disease. If you take aspirin to prevent heart attack or stroke, talk with your doctor or health care professional. Do not take other medicines that  contain aspirin, ibuprofen, or naproxen with this medicine. Side effects such as stomach upset, nausea, or ulcers may be more likely to occur. Many medicines available without a prescription should not be taken with this medicine. This medicine can cause ulcers and bleeding in the stomach and intestines at any time during treatment. Ulcers and bleeding can happen without warning symptoms and can cause death. To reduce your risk, do not smoke cigarettes or drink alcohol while you are taking this medicine. You may get drowsy or dizzy. Do not drive, use machinery, or do anything that needs mental alertness until you know how this medicine affects you. Do not stand or sit up quickly, especially if you are an older patient. This reduces the risk of dizzy or fainting spells. This medicine can cause you to bleed more easily. Try to avoid damage to your teeth and gums when you brush or floss your teeth. This medicine may be used to treat migraines. If you take migraine medicines for 10 or more days a month,  your migraines may get worse. Keep a diary of headache days and medicine use. Contact your healthcare professional if your migraine attacks occur more frequently. What side effects may I notice from receiving this medicine? Side effects that you should report to your doctor or health care professional as soon as possible: -allergic reactions like skin rash, itching or hives, swelling of the face, lips, or tongue -severe stomach pain -signs and symptoms of bleeding such as bloody or black, tarry stools; red or dark-brown urine; spitting up blood or brown material that looks like coffee grounds; red spots on the skin; unusual bruising or bleeding from the eye, gums, or nose -signs and symptoms of a blood clot such as changes in vision; chest pain; severe, sudden headache; trouble speaking; sudden numbness or weakness of the face, arm, or leg -unexplained weight gain or swelling -unusually weak or tired -yellowing of eyes or skin Side effects that usually do not require medical attention (report to your doctor or health care professional if they continue or are bothersome): -bruising -diarrhea -dizziness, drowsiness -headache -nausea, vomiting This list may not describe all possible side effects. Call your doctor for medical advice about side effects. You may report side effects to FDA at 1-800-FDA-1088. Where should I keep my medicine? Keep out of the reach of children. Store at room temperature between 15 and 30 degrees C (59 and 86 degrees F). Keep container tightly closed. Throw away any unused medicine after the expiration date. NOTE: This sheet is a summary. It may not cover all possible information. If you have questions about this medicine, talk to your doctor, pharmacist, or health care provider.  2015, [employer]vier/[employer] Standard. (2012-12-21 10:48:02)

## 2014-03-09 NOTE — Progress Notes (Signed)
Internal Medicine Clinic Attending  Case discussed with Dr. Qureshi soon after the resident saw the patient.  We reviewed the resident's history and exam and pertinent patient test results.  I agree with the assessment, diagnosis, and plan of care documented in the resident's note. 

## 2014-03-10 ENCOUNTER — Encounter: Payer: Self-pay | Admitting: Internal Medicine

## 2014-03-10 ENCOUNTER — Ambulatory Visit (INDEPENDENT_AMBULATORY_CARE_PROVIDER_SITE_OTHER): Payer: Medicare Other | Admitting: Internal Medicine

## 2014-03-10 VITALS — BP 125/75 | HR 99 | Temp 97.9°F | Ht 59.0 in | Wt 156.1 lb

## 2014-03-10 DIAGNOSIS — E1169 Type 2 diabetes mellitus with other specified complication: Secondary | ICD-10-CM | POA: Diagnosis not present

## 2014-03-10 DIAGNOSIS — N189 Chronic kidney disease, unspecified: Secondary | ICD-10-CM

## 2014-03-10 DIAGNOSIS — I10 Essential (primary) hypertension: Secondary | ICD-10-CM | POA: Diagnosis not present

## 2014-03-10 DIAGNOSIS — Z0000 Encounter for general adult medical examination without abnormal findings: Secondary | ICD-10-CM | POA: Diagnosis not present

## 2014-03-10 DIAGNOSIS — E1122 Type 2 diabetes mellitus with diabetic chronic kidney disease: Secondary | ICD-10-CM | POA: Diagnosis not present

## 2014-03-10 DIAGNOSIS — G5701 Lesion of sciatic nerve, right lower limb: Secondary | ICD-10-CM | POA: Diagnosis not present

## 2014-03-10 DIAGNOSIS — E1159 Type 2 diabetes mellitus with other circulatory complications: Secondary | ICD-10-CM

## 2014-03-10 DIAGNOSIS — I152 Hypertension secondary to endocrine disorders: Secondary | ICD-10-CM

## 2014-03-10 LAB — GLUCOSE, CAPILLARY: Glucose-Capillary: 135 mg/dL — ABNORMAL HIGH (ref 70–99)

## 2014-03-10 NOTE — Progress Notes (Signed)
Subjective:   Patient ID: [name] female   DOB: Sep 06, 1945 68 y.o.   MRN: [id number]  HPI: [name] is a 68 y.o. female with PMH as listed below presenting to opc today for follow up visit after being seen by me on 11/2 for R hip pain.   R hip pain--much improved. Dx with R piriformis syndrome on last visit with me and given trial of Ibuprofen and flexeril. Today, reports pain down to 2 but when exacerbated up to 4-5. Was able to take a walk yesterday and generally notices improvement daily. She has not started the stretches yet but says she will start them today and bring the paperwork in the house where I gave her the stretches to do. Not requesting more refills today, will complete current course at home and reassess. We will recheck renal function today in setting of NSAIDS and slowly taper down medications if still needed. Pain still occasionally goes down right leg at times but much better from before per patient.   DM2--did not bring meter in today, could not find it but will follow up with pcp next visit.   Past Medical History  Diagnosis Date  . Allergy   . Hypertension   . Hyperlipidemia   . Fatty liver disease, nonalcoholic   . History of hyperkalemia   . Obesity   . Renal lesion   . Hx antineoplastic chemo  10/12 - 06/24/11    PACLITAX EL COMPLETED 06/24/11  . Anemia   . Diabetes mellitus ORAL MED  . OA (osteoarthritis) of knee     RIGHT LEG  . Mild nonproliferative diabetic retinopathy of right eye 03/19/11    Dr. Joseph Art  . Breast CA     (Rt) breast ca dx 4/12---  S/P RADICAL MASTECTOMY AND CHEMORADIATION  . Numbness and tingling     Hx: of in fingers and toes since chemotherapy   Current Outpatient Prescriptions  Medication Sig Dispense Refill  . anastrozole (ARIMIDEX) 1 MG tablet TAKE 1 TABLET BY MOUTH DAILY 30 tablet 7  . atorvastatin (LIPITOR) 10 MG tablet Take 1 tablet (10 mg total) by mouth at bedtime. 90 tablet 3  . cyclobenzaprine  (FLEXERIL) 5 MG tablet Take 1 tablet (5 mg total) by mouth every 8 (eight) hours as needed for muscle spasms. 15 tablet 0  . diphenhydrAMINE (BENADRYL) 25 MG tablet Take 1 tablet (25 mg total) by mouth every 6 (six) hours as needed for itching or allergies. For itching and rash 20 tablet 0  . DULoxetine (CYMBALTA) 30 MG capsule Take 1 capsule (30 mg total) by mouth daily. 30 capsule 1  . fluticasone (FLONASE) 50 MCG/ACT nasal spray Place 2 sprays into both nostrils daily. 16 g 2  . glipiZIDE (GLUCOTROL) 5 MG tablet Take 1 tablet (5 mg total) by mouth daily before breakfast. 30 tablet 3  . hydrochlorothiazide (HYDRODIURIL) 25 MG tablet Take 1 tablet (25 mg total) by mouth daily. 90 tablet 3  . HYDROcodone-acetaminophen (NORCO/VICODIN) 5-325 MG per tablet Take 1-2 tablets by mouth every 6 (six) hours as needed. 10 tablet 0  . ibuprofen (ADVIL,MOTRIN) 600 MG tablet Take 1 tablet (600 mg total) by mouth every 8 (eight) hours as needed for moderate pain. 15 tablet 0  . lisinopril (PRINIVIL,ZESTRIL) 40 MG tablet Take 1 tablet (40 mg total) by mouth daily. 30 tablet 3  . metFORMIN (GLUCOPHAGE) 500 MG tablet Take 1 tablet (500 mg total) by mouth 2 (two) times daily with a meal.  60 tablet 5  . Polyvinyl Alcohol-Povidone (REFRESH OP) Place 1 drop into both eyes daily as needed. Dry eyes     No current facility-administered medications for this visit.   Family History  Problem Relation Age of Onset  . Stroke Father   . Heart disease Sister   . Cancer Sister     breast  . Heart disease Brother   . Cancer Sister     COLON CANCER   History   Social History  . Marital Status: Married    Spouse Name: N/A    Number of Children: N/A  . Years of Education: 66   Social History Main Topics  . Smoking status: Never Smoker   . Smokeless tobacco: Never Used  . Alcohol Use: No  . Drug Use: No  . Sexual Activity: None     Comment: MENARCHE AGE 69, G4, P4, PARITY AGE 65, HRT 20+ YEARS     Other Topics  Concern  . 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  Patient needs to submit further paperwork to complete- Bonna Gains 08/14/09.   Financial assistance application completed.  Patient does not qualify for [place] 09/12/09.            Review of Systems:  Constitutional:  Denies fever, chills  Gastrointestinal:  Denies abdominal pain  Musculoskeletal:  R hip pain--improving  Skin:  Denies wound  Neurological:  Occasional pain down right leg   Objective:  Physical Exam: Filed Vitals:   03/10/14 0933  BP: 125/75  Pulse: 99  Temp: 97.9 F (36.6 C)  TempSrc: Oral  Height: 4\' 11"  (1.499 m)  Weight: 156 lb 1.6 oz (70.806 kg)  SpO2: 100%   Vitals reviewed. General: sitting in chair, NAD HEENT: EOMI Cardiac: RRR Pulm: clear to auscultation bilaterally, no wheezes, rales, or rhonchi Abd: soft, obese, BS present Ext: moving all 4 extremities, no tenderness to palpation of right hip but feels dull ache Neuro: alert and oriented X3, stable gait, strength 5/5 in all extremities  Assessment & Plan:  Discussed with Dr. Dareen Piano

## 2014-03-10 NOTE — Assessment & Plan Note (Addendum)
Lab Results  Component Value Date   HGBA1C 7.2 03/01/2014   HGBA1C 7.7 09/02/2013   HGBA1C 7.2 05/25/2013    Assessment: Diabetes control: fair control Progress toward A1C goal:    Comments: did not bring her meter in today  Plan: Medications:  continue current medications metformin 500mg  bid, glipizide 5mg  Home glucose monitoring: Frequency:   Timing: before meals Instruction/counseling given: reminded to bring blood glucose meter & log to each visit and reminded to bring medications to each visit Educational resources provided:   Self management tools provided:   Other plans: would consider increasing metformin to 1000mg  bid on follow up visit if she wishes, maybe can bring a1c down to <7. She will follow up with pcp for her diabetes next visit.  Lipid panel and bmet today

## 2014-03-10 NOTE — Assessment & Plan Note (Signed)
BP Readings from Last 3 Encounters:  03/10/14 125/75  03/06/14 127/76  03/03/14 171/97    Lab Results  Component Value Date   NA 141 12/06/2013   K 4.0 12/06/2013   CREATININE 1.4* 12/06/2013   Assessment: Blood pressure control: controlled Progress toward BP goal:  at goal  Plan: Medications:  continue current medications lisinopril 40mg  daily, hctz 25mg  Educational resources provided:   Self management tools provided:   Other plans: has not taken her bp medications this morning but will do so after breakfast

## 2014-03-10 NOTE — Assessment & Plan Note (Signed)
Lipid panel today

## 2014-03-10 NOTE — Assessment & Plan Note (Signed)
Pain much improved with rest, ibuprofen, and flexeril. Only using flexeril maybe bid and pain well controlled with ibuprofen 600mg  tid. Has not started stretches yet.   -continue flexeril and ibuprofen tablets that remain but cut back on dose and frequency as tolerated -start stretches as previously provided -bmet today to assess renal function given NSAID course; will help determine if she can tolerate more days with ibuprofen if needed -RTC if pain worsening--would consider PT at that time

## 2014-03-10 NOTE — Progress Notes (Signed)
INTERNAL MEDICINE TEACHING ATTENDING ADDENDUM - Xzayvier Fagin, MD: I reviewed and discussed at the time of visit with the resident Dr. Qureshi, the patient's medical history, physical examination, diagnosis and results of pertinent tests and treatment and I agree with the patient's care as documented.  

## 2014-03-11 LAB — LIPID PANEL
Cholesterol: 159 mg/dL (ref 0–200)
HDL: 40 mg/dL (ref >39)
LDL Cholesterol: 89 mg/dL (ref 0–99)
Total CHOL/HDL Ratio: 4 Ratio
Triglycerides: 152 mg/dL — ABNORMAL HIGH (ref <150)
VLDL: 30 mg/dL (ref 0–40)

## 2014-03-11 LAB — BASIC METABOLIC PANEL
BUN: 35 mg/dL — ABNORMAL HIGH (ref 6–23)
CO2: 27 mEq/L (ref 19–32)
Calcium: 10 mg/dL (ref 8.4–10.5)
Chloride: 99 mEq/L (ref 96–112)
Creat: 1.68 mg/dL — ABNORMAL HIGH (ref 0.50–1.10)
Glucose, Bld: 142 mg/dL — ABNORMAL HIGH (ref 70–99)
Potassium: 4.4 mEq/L (ref 3.5–5.3)
Sodium: 139 mEq/L (ref 135–145)

## 2014-03-18 ENCOUNTER — Other Ambulatory Visit: Payer: Self-pay | Admitting: Internal Medicine

## 2014-03-18 DIAGNOSIS — G5701 Lesion of sciatic nerve, right lower limb: Secondary | ICD-10-CM

## 2014-03-18 NOTE — Assessment & Plan Note (Signed)
I have been in contact with one of Ms. Bremer' Oncology providers, Susanne Borders NP.  No need for additional imaging.  She did allude to the fact that Arimidex can cause pain and that patients can be switched to a different AI if pain is an issue.  Per clinic note 03/10/2014, Ms. Pelphrey seems to be improving with conservative treatment.  Should she return with continued or worsening pain, will refer back to her Oncologist to discuss the possibility of switching to a different AI.

## 2014-03-25 ENCOUNTER — Other Ambulatory Visit: Payer: Self-pay | Admitting: Internal Medicine

## 2014-03-27 ENCOUNTER — Other Ambulatory Visit: Payer: Self-pay | Admitting: Oncology

## 2014-03-27 ENCOUNTER — Telehealth: Payer: Self-pay | Admitting: *Deleted

## 2014-03-27 NOTE — Telephone Encounter (Signed)
Prior script from [facility] was last filled on 12/17/13, but put back because patient never picked it up-per pharmacy tech at [facility] to [place], who requested refill today-was last filled there #30 01/2014 and has no more refills on the script. Approved refill X 2 with note to please take consistently. Attempted to reach patient at home #-no answer.

## 2014-04-24 ENCOUNTER — Other Ambulatory Visit: Payer: Self-pay | Admitting: Oncology

## 2014-05-10 ENCOUNTER — Ambulatory Visit (INDEPENDENT_AMBULATORY_CARE_PROVIDER_SITE_OTHER): Payer: Medicare Other | Admitting: Internal Medicine

## 2014-05-10 ENCOUNTER — Encounter: Payer: Self-pay | Admitting: Internal Medicine

## 2014-05-10 VITALS — BP 145/71 | HR 93 | Temp 98.1°F | Ht 59.0 in | Wt 167.9 lb

## 2014-05-10 DIAGNOSIS — E1159 Type 2 diabetes mellitus with other circulatory complications: Secondary | ICD-10-CM

## 2014-05-10 DIAGNOSIS — G5701 Lesion of sciatic nerve, right lower limb: Secondary | ICD-10-CM

## 2014-05-10 DIAGNOSIS — E785 Hyperlipidemia, unspecified: Secondary | ICD-10-CM

## 2014-05-10 DIAGNOSIS — E119 Type 2 diabetes mellitus without complications: Secondary | ICD-10-CM

## 2014-05-10 DIAGNOSIS — E1129 Type 2 diabetes mellitus with other diabetic kidney complication: Secondary | ICD-10-CM | POA: Diagnosis not present

## 2014-05-10 DIAGNOSIS — E1169 Type 2 diabetes mellitus with other specified complication: Secondary | ICD-10-CM

## 2014-05-10 DIAGNOSIS — I10 Essential (primary) hypertension: Secondary | ICD-10-CM

## 2014-05-10 DIAGNOSIS — G629 Polyneuropathy, unspecified: Secondary | ICD-10-CM

## 2014-05-10 DIAGNOSIS — I152 Hypertension secondary to endocrine disorders: Secondary | ICD-10-CM

## 2014-05-10 LAB — GLUCOSE, CAPILLARY: Glucose-Capillary: 156 mg/dL — ABNORMAL HIGH (ref 70–99)

## 2014-05-10 LAB — POCT GLYCOSYLATED HEMOGLOBIN (HGB A1C): Hemoglobin A1C: 7.1

## 2014-05-10 MED ORDER — GABAPENTIN 100 MG PO CAPS: 100.0000 mg | ORAL_CAPSULE | Freq: Three times a day (TID) | ORAL | Status: DC

## 2014-05-10 MED ORDER — ATORVASTATIN CALCIUM 40 MG PO TABS: 40.0000 mg | ORAL_TABLET | Freq: Every day | ORAL | Status: DC

## 2014-05-10 NOTE — Progress Notes (Signed)
   Subjective:    Patient ID: [name], female    DOB: July 06, 1945, 69 y.o.   MRN: [id number]  HPI Comments: [name] is a 69 y.o. woman with history of type 2 DM, HTN, HLD and breast cancer (s/p right mastectomy/lymph node dissection, 4 cycles of chemo, XRT) currently on Arimadex (x 1 year with plan for 5 year total trx). She is doing well today and has no complaints.  Please see problem based charting for update of her chronic conditions.      Review of Systems  Constitutional: Negative for fever, chills, appetite change and unexpected weight change.  Respiratory: Negative for shortness of breath.   Cardiovascular: Negative for chest pain, palpitations and leg swelling.  Gastrointestinal: Negative for nausea, vomiting, abdominal pain, diarrhea, constipation and blood in stool.  Genitourinary: Negative for dysuria and hematuria.  Musculoskeletal: Negative for myalgias, back pain and arthralgias.  Neurological: Negative for syncope, weakness, light-headedness and headaches.  Hematological: Does not bruise/bleed easily.       Filed Vitals:   05/10/14 1530  BP: 145/71  Pulse: 93  Temp: 98.1 F (36.7 C)    Objective:   Physical Exam  Constitutional: [name], place, and time. She appears well-developed. No distress.  HENT:  Head: Normocephalic and atraumatic.  Mouth/Throat: Oropharynx is clear and moist. No oropharyngeal exudate.  Eyes: EOM are normal. Pupils are equal, round, and reactive to light.  Neck: Normal range of motion.  Cardiovascular: Normal rate, regular rhythm and normal heart sounds.   Pulmonary/Chest: Effort normal and breath sounds normal. No respiratory distress. She has no wheezes. She has no rales.  Abdominal: Soft. Bowel sounds are normal. She exhibits no distension. There is no tenderness. There is no rebound.  Musculoskeletal: Normal range of motion. She exhibits no edema or tenderness.  Neurological: She is alert and oriented to  person, place, and time. No cranial nerve deficit.  Skin: Skin is warm. She is not diaphoretic.  Psychiatric: She has a normal mood and affect. Her behavior is normal.  Vitals reviewed.         Assessment & Plan:  Please see problem based assessment and plan.

## 2014-05-10 NOTE — Assessment & Plan Note (Addendum)
Her symptoms have resolved completely.

## 2014-05-10 NOTE — Assessment & Plan Note (Addendum)
Reports numbness/tingling in hands/feet that started during chemo but has persisted since.  DM is fairly well controlled.  She reports cymbalta worked for her numbness/tingling of hands/feet.  However, she was only able to afford this med for one month.   - will try Gabapentin

## 2014-05-10 NOTE — Assessment & Plan Note (Addendum)
BP Readings from Last 3 Encounters:  05/10/14 145/71  03/10/14 125/75  03/06/14 127/76    Lab Results  Component Value Date   NA 139 03/10/2014   K 4.4 03/10/2014   CREATININE 1.68* 03/10/2014    Assessment: Blood pressure control:  slightly elevated Progress toward BP goal:   worsened Comments: She has not taken lisinopril in several months (from looking at number of refills she had, probably has not taken since 01/2014).  She attempted to refill Lisinopril twice.  Both times she went to [facility] and they were supposed to send refill request but it was not received.  She reports compliance with HCTZ.  Plan: Medications:  continue current medications Educational resources provided:  (has information) Self management tools provided:   Other plans: BMP to check renal function prior to restarting lisinopril.  May need to try amlodipine instead.  Lab Results  Component Value Date   CREATININE 0.91 05/10/2014   CREATININE 1.68* 03/10/2014   CREATININE 1.4* 12/06/2013    ADDENDUM:  Renal function improved.  Ok to resume lisinopril.  K mildly low at 3.4.  Will recheck BMP in two weeks.  If K persistently low will reduce HCTZ to 12.5mg .

## 2014-05-10 NOTE — Assessment & Plan Note (Addendum)
10 year ASCVD risk based on recent lipid panel (03/2014) is 24%.  She is currently on Lipitor 10mg  daily but will likely benefit from high intensity statin.  Will increase gradually and see if she tolerates. - increase atorvastatin to 40mg  daily (plan to titrate up to 80mg  daily).

## 2014-05-10 NOTE — Assessment & Plan Note (Addendum)
Lab Results  Component Value Date   HGBA1C 7.1 05/10/2014   HGBA1C 7.2 03/01/2014   HGBA1C 7.7 09/02/2013     Assessment: Diabetes control:  improving  Progress toward A1C goal:   near goal Comments: reports compliance with glipizide and metformin.  Not currently checking CBGs because she recently got a new meter and was unsure how to use it.  No symptoms of hypoglycemia.  Plan: Medications:  continue current medications:  Glipizide 5mg  qAM and metformin 500mg  BID (per patient, she was intolerant to higher dose) Home glucose monitoring: Frequency:  3x daily Timing:   Instruction/counseling given: reminded to bring blood glucose meter & log to each visit Educational resources provided:  (use of meter) Self management tools provided:   Other plans: DM educator showed her how to use the meter today.  She agrees to bring meter and log to next visit.  Follow-up in 3 months.

## 2014-05-11 ENCOUNTER — Other Ambulatory Visit: Payer: Self-pay | Admitting: Internal Medicine

## 2014-05-11 DIAGNOSIS — E876 Hypokalemia: Secondary | ICD-10-CM

## 2014-05-11 LAB — BASIC METABOLIC PANEL WITH GFR
BUN: 14 mg/dL (ref 6–23)
CO2: 30 mEq/L (ref 19–32)
Calcium: 9.8 mg/dL (ref 8.4–10.5)
Chloride: 100 mEq/L (ref 96–112)
Creat: 0.91 mg/dL (ref 0.50–1.10)
GFR, Est African American: 76 mL/min
GFR, Est Non African American: 66 mL/min
Glucose, Bld: 154 mg/dL — ABNORMAL HIGH (ref 70–99)
Potassium: 3.4 mEq/L — ABNORMAL LOW (ref 3.5–5.3)
Sodium: 140 mEq/L (ref 135–145)

## 2014-05-11 MED ORDER — GLIPIZIDE 5 MG PO TABS: 5.0000 mg | ORAL_TABLET | Freq: Every day | ORAL | Status: DC

## 2014-05-11 MED ORDER — METFORMIN HCL 500 MG PO TABS: 500.0000 mg | ORAL_TABLET | Freq: Two times a day (BID) | ORAL | Status: DC

## 2014-05-11 MED ORDER — LISINOPRIL 40 MG PO TABS: 40.0000 mg | ORAL_TABLET | Freq: Every day | ORAL | Status: DC

## 2014-05-11 NOTE — Addendum Note (Signed)
Addended by: Francesca Oman on: 05/11/2014 02:37 PM   Modules accepted: Orders

## 2014-05-12 NOTE — Progress Notes (Signed)
Internal Medicine Clinic Attending  Case discussed with Dr. Wilson soon after the resident saw the patient.  We reviewed the resident's history and exam and pertinent patient test results.  I agree with the assessment, diagnosis, and plan of care documented in the resident's note.  

## 2014-05-17 ENCOUNTER — Telehealth: Payer: Self-pay | Admitting: *Deleted

## 2014-05-17 NOTE — Telephone Encounter (Signed)
Call to patient x 2 days messages left to call Clinics concerning medication changes.  Sander Nephew, RN 05/17/2014 1:46 PM

## 2014-05-19 ENCOUNTER — Other Ambulatory Visit: Payer: Self-pay | Admitting: Internal Medicine

## 2014-05-19 DIAGNOSIS — E876 Hypokalemia: Secondary | ICD-10-CM

## 2014-05-19 NOTE — Telephone Encounter (Signed)
I was able to reach [name] this afternoon.  I informed her that renal function was stable and to resume her Lisinopril.  She stated she had picked up rx and started taking it.  She agrees to come in for [facility] during the last week in January.  I also informed her of increased statin dose and she is agreeable.

## 2014-05-22 ENCOUNTER — Other Ambulatory Visit: Payer: Self-pay | Admitting: Oncology

## 2014-05-31 ENCOUNTER — Other Ambulatory Visit (INDEPENDENT_AMBULATORY_CARE_PROVIDER_SITE_OTHER): Payer: Medicare Other

## 2014-05-31 DIAGNOSIS — E876 Hypokalemia: Secondary | ICD-10-CM

## 2014-05-31 LAB — BASIC METABOLIC PANEL WITH GFR
BUN: 23 mg/dL (ref 6–23)
CO2: 28 mEq/L (ref 19–32)
Calcium: 9.6 mg/dL (ref 8.4–10.5)
Chloride: 101 mEq/L (ref 96–112)
Creat: 1.03 mg/dL (ref 0.50–1.10)
GFR, Est African American: 65 mL/min
GFR, Est Non African American: 57 mL/min — ABNORMAL LOW
Glucose, Bld: 209 mg/dL — ABNORMAL HIGH (ref 70–99)
Potassium: 3.7 mEq/L (ref 3.5–5.3)
Sodium: 139 mEq/L (ref 135–145)

## 2014-06-13 ENCOUNTER — Other Ambulatory Visit (HOSPITAL_BASED_OUTPATIENT_CLINIC_OR_DEPARTMENT_OTHER): Payer: Medicare Other

## 2014-06-13 DIAGNOSIS — C50311 Malignant neoplasm of lower-inner quadrant of right female breast: Secondary | ICD-10-CM | POA: Diagnosis not present

## 2014-06-13 DIAGNOSIS — C773 Secondary and unspecified malignant neoplasm of axilla and upper limb lymph nodes: Principal | ICD-10-CM

## 2014-06-13 DIAGNOSIS — C50911 Malignant neoplasm of unspecified site of right female breast: Secondary | ICD-10-CM

## 2014-06-13 LAB — COMPREHENSIVE METABOLIC PANEL (CC13)
ALT: 15 U/L (ref 0–55)
AST: 13 U/L (ref 5–34)
Albumin: 4.2 g/dL (ref 3.5–5.0)
Alkaline Phosphatase: 76 U/L (ref 40–150)
Anion Gap: 13 mEq/L — ABNORMAL HIGH (ref 3–11)
BUN: 18.6 mg/dL (ref 7.0–26.0)
CO2: 28 mEq/L (ref 22–29)
Calcium: 9.7 mg/dL (ref 8.4–10.4)
Chloride: 101 mEq/L (ref 98–109)
Creatinine: 1.2 mg/dL — ABNORMAL HIGH (ref 0.6–1.1)
EGFR: 56 mL/min/{1.73_m2} — ABNORMAL LOW (ref >90)
Glucose: 171 mg/dl — ABNORMAL HIGH (ref 70–140)
Potassium: 3.7 mEq/L (ref 3.5–5.1)
Sodium: 142 mEq/L (ref 136–145)
Total Bilirubin: 0.36 mg/dL (ref 0.20–1.20)
Total Protein: 7.1 g/dL (ref 6.4–8.3)

## 2014-06-13 LAB — CBC WITH DIFFERENTIAL/PLATELET
BASO%: 0.2 % (ref 0.0–2.0)
Basophils Absolute: 0 10*3/uL (ref 0.0–0.1)
EOS%: 1.7 % (ref 0.0–7.0)
Eosinophils Absolute: 0.1 10*3/uL (ref 0.0–0.5)
HCT: 33.8 % — ABNORMAL LOW (ref 34.8–46.6)
HGB: 10.9 g/dL — ABNORMAL LOW (ref 11.6–15.9)
LYMPH%: 34 % (ref 14.0–49.7)
MCH: 30.3 pg (ref 25.1–34.0)
MCHC: 32.2 g/dL (ref 31.5–36.0)
MCV: 93.9 fL (ref 79.5–101.0)
MONO#: 0.3 10*3/uL (ref 0.1–0.9)
MONO%: 7.1 % (ref 0.0–14.0)
NEUT#: 2.8 10*3/uL (ref 1.5–6.5)
NEUT%: 57 % (ref 38.4–76.8)
Platelets: 209 10*3/uL (ref 145–400)
RBC: 3.6 10*6/uL — ABNORMAL LOW (ref 3.70–5.45)
RDW: 16.1 % — ABNORMAL HIGH (ref 11.2–14.5)
WBC: 4.8 10*3/uL (ref 3.9–10.3)
lymph#: 1.6 10*3/uL (ref 0.9–3.3)

## 2014-06-20 ENCOUNTER — Other Ambulatory Visit: Payer: Self-pay | Admitting: Oncology

## 2014-06-20 ENCOUNTER — Ambulatory Visit (HOSPITAL_BASED_OUTPATIENT_CLINIC_OR_DEPARTMENT_OTHER): Payer: Medicare Other | Admitting: Oncology

## 2014-06-20 ENCOUNTER — Telehealth: Payer: Self-pay | Admitting: Oncology

## 2014-06-20 VITALS — BP 143/64 | HR 91 | Temp 98.8°F | Resp 18 | Ht 59.0 in | Wt 164.2 lb

## 2014-06-20 DIAGNOSIS — M8580 Other specified disorders of bone density and structure, unspecified site: Secondary | ICD-10-CM

## 2014-06-20 DIAGNOSIS — C50919 Malignant neoplasm of unspecified site of unspecified female breast: Secondary | ICD-10-CM

## 2014-06-20 DIAGNOSIS — C773 Secondary and unspecified malignant neoplasm of axilla and upper limb lymph nodes: Secondary | ICD-10-CM

## 2014-06-20 DIAGNOSIS — C50911 Malignant neoplasm of unspecified site of right female breast: Secondary | ICD-10-CM

## 2014-06-20 NOTE — Addendum Note (Signed)
Addended by: Laureen Abrahams on: 06/20/2014 04:22 PM   Modules accepted: Orders, Medications

## 2014-06-20 NOTE — Telephone Encounter (Signed)
gave pt appt avs report and appts for august including bone density at [facility] 8/22. message to GM to enter mammo order - pt aware.

## 2014-06-20 NOTE — Progress Notes (Signed)
ID: [name]   DOB: 1946-04-25  MR#: [id number]  TDD#:[id number]   PCP: Evelena Peat, DO GYN: Konrad Dolores SU: Thomas Cornett OTHER MD: Gean Birchwood, Enedina Finner  CHIEF COMPLAINT: Right breast cancer s/p mastectomy   CURRENT THERAPY: Anastrozole  BREAST CANCER HISTORY : From the original intake note:  [name] had screening mammography Sep 11, 2010, at [facility]. This showed a possible right axillary mass and she was brought back for additional views Sep 20, 2010. An ultrasound showed abnormal enlarged lymph nodes in the right axilla measuring a maximum of 2.2 cm. Biopsy was suggested and performed on Sep 25, 2010. The pathology from that procedure ([id number]) showed metastatic adenocarcinoma consistent with a breast primary. In particular, the tumor was estrogen and progesterone receptor positive, HER2/neu was negative, the MIB-1 was 40%.  With this information, the patient was referred for bilateral breast MRIs performed May 29. This showed a complex picture, there being in addition to 2 pathologically enlarged lymph nodes in the right axilla and then other mildly prominent lymph nodes, 2 masses in the breast itself, one was subareolar and measured 1.3 cm, the second mass was more posterior and inferior and measured 8 mm. The distance between these 2 masses was 4.5 cm. the second breast mass was biopsied and was found to be benign.  She has a deleterious BRCA-2 mutation, namely 6696 deletion TC.  Status post right mastectomy and axillary lymph node dissection December 04, 2010 for a 1.5 cm invasive ductal carcinoma, grade 3, involving 3/12 axillary lymph nodes, with ample margins, T1c N1 M0, estrogen receptor 94% positive, progesterone receptor 4% positive with an MIB-1 of 62% and no HER2 amplification on the original material. Further therapy as detailed below.  INTERVAL HISTORY:  [name] returns today for follow up of her right breast cancer. The interval history is generally  stable. She continues on anastrozole. She is not aware of any side effects from this medication. She does have hot flashes but she does not feel they are more frequent or intense than before. Currently she is obtaining the drug for free, but the most she ever paid for it was $12 a month.   REVIEW OF SYSTEMS:  [name] Has had mild sinus symptoms, for which she is using a nasal spray. She tells me her blood sugars generally have been well-controlled although this weekend, when she went out to eat for valentines day, it went up to 294 post prandial he. Vaginal dryness is not a significant problem at present. A detailed review of systems today was otherwise stable.  PAST MEDICAL HISTORY: Past Medical History  Diagnosis Date  . Allergy   . Hypertension   . Hyperlipidemia   . Fatty liver disease, nonalcoholic   . History of hyperkalemia   . Obesity   . Renal lesion   . Hx antineoplastic chemo  10/12 - 06/24/11    PACLITAX EL COMPLETED 06/24/11  . Anemia   . Diabetes mellitus ORAL MED  . OA (osteoarthritis) of knee     RIGHT LEG  . Mild nonproliferative diabetic retinopathy of right eye 03/19/11    Dr. Norva Pavlov  . Breast CA     (Rt) breast ca dx 4/12---  S/P RADICAL MASTECTOMY AND CHEMORADIATION  . Numbness and tingling     Hx: of in fingers and toes since chemotherapy    PAST SURGICAL HISTORY: Past Surgical History  Procedure Laterality Date  . Knee arthroscopy  10-28-1999    RIGHT  . Mastectomy  modified radical  12-04-2010    W/ LEFT PAC PLACEMENT (RIGHT BREAST W/ AXILLARY CONTENTS/ NODE BX'S  . Transthoracic echocardiogram  10-29-2010    NORMAL LVF/ EF 55-60%/ MILD MV REGURG  . Vaginal hysterectomy  AGE 26    W/ LSO  . Tubal ligation  YRS AGO  . Laparoscopy with laparoscopic right salpingo oophorectomy and lysis of pelvic and abdominal adhesions  March 2013    Dr. Jennette Kettle   . Portacath placement      REPLACED [facility] DUE TO MALFUNCTION (LEFT)  . Port-a-cath removal  09/24/2011     Procedure: REMOVAL PORT-A-CATH;  Surgeon: Clovis Pu. Cornett, MD;  Location: [facility] ORS;  Service: General;  Laterality: N/A;  . Colonoscopy w/ polypectomy      Hx; of  . Total knee arthroplasty Right 08/16/2012    Procedure: TOTAL KNEE ARTHROPLASTY;  Surgeon: [name], MD;  Location: [facility] OR;  Service: Orthopedics;  Laterality: Right;  . I&d knee with poly exchange Right 09/03/2012    Procedure: IRRIGATION AND DEBRIDEMENT RIGHT KNEE WITH POLY EXCHANGE;  Surgeon: [name], MD;  Location: [facility] OR;  Service: Orthopedics;  Laterality: Right;    FAMILY HISTORY Family History  Problem Relation Age of Onset  . Stroke Father   . Heart disease Sister   . Cancer Sister     breast  . Heart disease Brother   . Cancer Sister     COLON CANCER  patient's mother alive age 51; father deceased, cause unknown. The patient has 8 sisters and 9 brothers.  GYNECOLOGIC HISTORY: GX, P4, first pregnancy age 17; hysterectomy age 58 with USO; right USO 2013; on premarin >20 years, discontinued at the time of breast cancer diagnosis  SOCIAL HISTORY: retired from office work; husband Derrick works as a Curator; 4 daughters, 3 in [place] {[place], [place] and [place]), 1 in [place] ([place]); 3 grandsons; attends [employer].   ADVANCED DIRECTIVES:  HEALTH MAINTENANCE: History  Substance Use Topics  . Smoking status: Never Smoker   . Smokeless tobacco: Never Used  . Alcohol Use: No     Colonoscopy: Approx 2011  PAP:  UTD   Bone density: August 2013;Young Adult T Score: -1.3  Lipid panel: followed by PCP, UTD  Allergies  Allergen Reactions  . Metformin Other (See Comments)    REACTION, diarrhea: 850 mg - diarrhea but tolerates 500 mg daily    Current Outpatient Prescriptions  Medication Sig Dispense Refill  . anastrozole (ARIMIDEX) 1 MG tablet TAKE 1 TABLET( 1 MG TOTAL) BY MOUTH DAILY. PLEASE TAKE CONSISTENTLY. 90 tablet 1  . anastrozole (ARIMIDEX) 1 MG tablet TAKE 1 TABLET( 1 MG TOTAL)  BY MOUTH DAILY. PLEASE TAKE CONSISTENTLY. 30 tablet 0  . atorvastatin (LIPITOR) 40 MG tablet Take 1 tablet (40 mg total) by mouth at bedtime. 30 tablet 1  . gabapentin (NEURONTIN) 100 MG capsule Take 1 capsule (100 mg total) by mouth 3 (three) times daily. 90 capsule 1  . glipiZIDE (GLUCOTROL) 5 MG tablet Take 1 tablet (5 mg total) by mouth daily before breakfast. 30 tablet 3  . hydrochlorothiazide (HYDRODIURIL) 25 MG tablet Take 1 tablet (25 mg total) by mouth daily. 90 tablet 3  . lisinopril (PRINIVIL,ZESTRIL) 40 MG tablet Take 1 tablet (40 mg total) by mouth daily. 30 tablet 3  . metFORMIN (GLUCOPHAGE) 500 MG tablet Take 1 tablet (500 mg total) by mouth 2 (two) times daily with a meal. 60 tablet 3  . Polyvinyl Alcohol-Povidone (REFRESH OP) Place  1 drop into both eyes daily as needed. Dry eyes     No current facility-administered medications for this visit.    OBJECTIVE: Middle-aged [place] American woman in no acute distress  Filed Vitals:   06/20/14 1314  BP: 143/64  Pulse: 91  Temp: 98.8 F (37.1 C)  Resp: 18     Body mass index is 33.15 kg/(m^2).    ECOG FS: 1 Filed Weights   06/20/14 1314  Weight: 164 lb 3.2 oz (74.481 kg)   Sclerae unicteric, EOMs intact Oropharynx clear and moist-- no thrush or other lesions No cervical or supraclavicular adenopathy Lungs no rales or rhonchi Heart regular rate and rhythm Abd soft, nontender, positive bowel sounds MSK mild kyphosis and scoliosis but no focal spinal tenderness Neuro: nonfocal, well oriented, appropriate affect Breasts: The right breast is status post mastectomy and radiation. There is no evidence of local recurrence. The right axilla is benign. The left breast is unremarkable.    LAB RESULTS: Lab Results  Component Value Date   WBC 4.8 06/13/2014   NEUTROABS 2.8 06/13/2014   HGB 10.9* 06/13/2014   HCT 33.8* 06/13/2014   MCV 93.9 06/13/2014   PLT 209 06/13/2014      Chemistry      Component Value Date/Time   NA  142 06/13/2014 0943   NA 139 05/31/2014 0940   K 3.7 06/13/2014 0943   K 3.7 05/31/2014 0940   CL 101 05/31/2014 0940   CL 100 05/27/2012 0947   CO2 28 06/13/2014 0943   CO2 28 05/31/2014 0940   BUN 18.6 06/13/2014 0943   BUN 23 05/31/2014 0940   CREATININE 1.2* 06/13/2014 0943   CREATININE 1.03 05/31/2014 0940   CREATININE 1.60* 09/17/2012 0156   GLU 132* 03/25/2011 0957      Component Value Date/Time   CALCIUM 9.7 06/13/2014 0943   CALCIUM 9.6 05/31/2014 0940   ALKPHOS 76 06/13/2014 0943   ALKPHOS 72 11/02/2013 1448   AST 13 06/13/2014 0943   AST 18 11/02/2013 1448   ALT 15 06/13/2014 0943   ALT 22 11/02/2013 1448   BILITOT 0.36 06/13/2014 0943   BILITOT 0.3 11/02/2013 1448       Lab Results  Component Value Date   LABCA2 12 05/27/2012    STUDIES: No results found.   ASSESSMENT:68 y.o.  BRCA-2 positive [name] woman,  (1) status post right mastectomy and axillary lymph node dissection August of 2012 for a T1c N1 (Stage IIA) invasive ductal carcinoma, grade 3, estrogen and progesterone receptor positive, HER-2 negative, with an MIB-1-1 of 62%,   (2) Status post 4 cycles of doxorubicin and cyclophosphamide given in dose dense fashion, followed  by weekly paclitaxel x 12 completed 06/24/2011  (3) received post-mastectomy radiation completed April 2013  (4)  started on anastrozole in May 2013; most recent dexa scan August of 2013 showed mild osteopenia  (5) s/p remote hysterectomy and left salpingo-oophorectomy; s/p right salpingo-oophorectomy March 2013 with benign pathology    PLAN:  [name] is Tolerating the anastrozole well and the plan currently is to continue that for 5 years. I am concerned about bone density issues and she will have a repeat bone density in August at the same time as her next mammogram. She will then see me late August to discuss those results.  She knows to call for any problems that may develop before her next visit here.    Selig Wampole C    06/20/2014

## 2014-06-21 ENCOUNTER — Other Ambulatory Visit: Payer: Self-pay | Admitting: Oncology

## 2014-06-21 DIAGNOSIS — Z1231 Encounter for screening mammogram for malignant neoplasm of breast: Secondary | ICD-10-CM

## 2014-06-22 ENCOUNTER — Telehealth: Payer: Self-pay | Admitting: Oncology

## 2014-06-22 NOTE — Telephone Encounter (Signed)
mammo order entered. appt schedule @ [facility] (per pt) same day as bone density. left message on vm for pt re appt for mammo 8/22 @ 9:30am - bone density test same day @ 9am. pt has schedule and bone density and was aware I would call to confirm mammo when order was entered.

## 2014-07-18 ENCOUNTER — Other Ambulatory Visit: Payer: Self-pay | Admitting: Internal Medicine

## 2014-07-18 DIAGNOSIS — H04123 Dry eye syndrome of bilateral lacrimal glands: Secondary | ICD-10-CM | POA: Diagnosis not present

## 2014-07-18 DIAGNOSIS — E11329 Type 2 diabetes mellitus with mild nonproliferative diabetic retinopathy without macular edema: Secondary | ICD-10-CM | POA: Diagnosis not present

## 2014-07-18 LAB — HM DIABETES EYE EXAM

## 2014-08-10 ENCOUNTER — Ambulatory Visit (INDEPENDENT_AMBULATORY_CARE_PROVIDER_SITE_OTHER): Payer: Medicare Other | Admitting: Internal Medicine

## 2014-08-10 VITALS — BP 132/72 | HR 87 | Temp 98.1°F | Wt 167.5 lb

## 2014-08-10 DIAGNOSIS — I152 Hypertension secondary to endocrine disorders: Secondary | ICD-10-CM

## 2014-08-10 DIAGNOSIS — E1169 Type 2 diabetes mellitus with other specified complication: Secondary | ICD-10-CM

## 2014-08-10 DIAGNOSIS — I10 Essential (primary) hypertension: Secondary | ICD-10-CM

## 2014-08-10 DIAGNOSIS — C773 Secondary and unspecified malignant neoplasm of axilla and upper limb lymph nodes: Secondary | ICD-10-CM

## 2014-08-10 DIAGNOSIS — E1129 Type 2 diabetes mellitus with other diabetic kidney complication: Secondary | ICD-10-CM

## 2014-08-10 DIAGNOSIS — G629 Polyneuropathy, unspecified: Secondary | ICD-10-CM

## 2014-08-10 DIAGNOSIS — E1140 Type 2 diabetes mellitus with diabetic neuropathy, unspecified: Secondary | ICD-10-CM

## 2014-08-10 DIAGNOSIS — C50919 Malignant neoplasm of unspecified site of unspecified female breast: Secondary | ICD-10-CM

## 2014-08-10 DIAGNOSIS — Z1211 Encounter for screening for malignant neoplasm of colon: Secondary | ICD-10-CM

## 2014-08-10 DIAGNOSIS — G5701 Lesion of sciatic nerve, right lower limb: Secondary | ICD-10-CM | POA: Diagnosis not present

## 2014-08-10 DIAGNOSIS — E1165 Type 2 diabetes mellitus with hyperglycemia: Secondary | ICD-10-CM

## 2014-08-10 DIAGNOSIS — E1159 Type 2 diabetes mellitus with other circulatory complications: Secondary | ICD-10-CM

## 2014-08-10 LAB — BASIC METABOLIC PANEL WITH GFR
BUN: 17 mg/dL (ref 6–23)
CO2: 26 mEq/L (ref 19–32)
Calcium: 9.5 mg/dL (ref 8.4–10.5)
Chloride: 101 mEq/L (ref 96–112)
Creat: 1.01 mg/dL (ref 0.50–1.10)
GFR, Est African American: 67 mL/min
GFR, Est Non African American: 58 mL/min — ABNORMAL LOW
Glucose, Bld: 133 mg/dL — ABNORMAL HIGH (ref 70–99)
Potassium: 3.5 mEq/L (ref 3.5–5.3)
Sodium: 141 mEq/L (ref 135–145)

## 2014-08-10 LAB — GLUCOSE, CAPILLARY: Glucose-Capillary: 186 mg/dL — ABNORMAL HIGH (ref 70–99)

## 2014-08-10 LAB — POCT GLYCOSYLATED HEMOGLOBIN (HGB A1C): Hemoglobin A1C: 8

## 2014-08-10 MED ORDER — GABAPENTIN 100 MG PO CAPS: 100.0000 mg | ORAL_CAPSULE | Freq: Every day | ORAL | Status: DC

## 2014-08-10 MED ORDER — GLIPIZIDE 5 MG PO TABS: 5.0000 mg | ORAL_TABLET | Freq: Two times a day (BID) | ORAL | Status: DC

## 2014-08-10 MED ORDER — METFORMIN HCL 500 MG PO TABS: 500.0000 mg | ORAL_TABLET | Freq: Two times a day (BID) | ORAL | Status: DC

## 2014-08-10 NOTE — Assessment & Plan Note (Signed)
Resolved with short course of NSAIDs.  No pain in several months.

## 2014-08-10 NOTE — Patient Instructions (Signed)
1.Please increase your glpizide to 5mg  (1 tablet) two times per day with meals.  Keep taking your metformin twice per day.  Try and log your blood sugar in the booklet we gave you.  Let me know if you blood sugars are running below 90 or if you feel low on the increased dose of glipizide.  You can take the gabapentin every night (instead of three times daily) since your discomfort is mostly at night and since it makes you sleepy (to avoid being drowsy during the day).   2. Please take all medications as prescribed.    3. If you have worsening of your symptoms or new symptoms arise, please call the clinic ([phone]), or go to the ER immediately if symptoms are severe.

## 2014-08-10 NOTE — Progress Notes (Signed)
Internal Medicine Clinic Attending  Case discussed with Dr. Wilson at the time of the visit.  We reviewed the resident's history and exam and pertinent patient test results.  I agree with the assessment, diagnosis, and plan of care documented in the resident's note.  

## 2014-08-10 NOTE — Assessment & Plan Note (Signed)
Follows with Dr. Jana Hakim.  On Arimidex with occasional hot flashes, but she says this is tolerable.

## 2014-08-10 NOTE — Progress Notes (Signed)
   Subjective:    Patient ID: [name], female    DOB: 10-Dec-1945, 69 y.o.   MRN: [id number]  HPI Comments: [name] is a 69 year old woman with a PMH as below here for follow-up of her chronic conditions.  Please see problem based assessment and plan for details.     Past Medical History  Diagnosis Date  . Allergy   . Hypertension   . Hyperlipidemia   . Fatty liver disease, nonalcoholic   . History of hyperkalemia   . Obesity   . Renal lesion   . Hx antineoplastic chemo  10/12 - 06/24/11    PACLITAX EL COMPLETED 06/24/11  . Anemia   . Diabetes mellitus ORAL MED  . OA (osteoarthritis) of knee     RIGHT LEG  . Mild nonproliferative diabetic retinopathy of right eye 03/19/11    Dr. Joseph Art  . Breast CA     (Rt) breast ca dx 4/12---  S/P RADICAL MASTECTOMY AND CHEMORADIATION  . Numbness and tingling     Hx: of in fingers and toes since chemotherapy   Review of Systems  Constitutional: Negative for fever, chills and unexpected weight change.       She reports improved appetite.  HENT: Negative for hearing loss.   Eyes: Negative for visual disturbance.  Respiratory: Negative for cough and shortness of breath.   Cardiovascular: Positive for leg swelling. Negative for chest pain and palpitations.  Gastrointestinal: Positive for nausea. Negative for vomiting, abdominal pain, diarrhea, constipation and blood in stool.       Some smells make her nauseous.  Endocrine: Negative for polydipsia and polyuria.  Genitourinary: Negative for dysuria, hematuria and vaginal bleeding.  Neurological: Negative for syncope and light-headedness.  Hematological: Bruises/bleeds easily.       Two nose bleeds in the past week (small amount of blood) that stopped quickly.   Psychiatric/Behavioral: Negative for dysphoric mood.       Filed Vitals:   08/10/14 0905 08/10/14 1003  BP: 144/76 132/72  Pulse: 93 87  Temp: 98.1 F (36.7 C)   Weight: 167 lb 8 oz (75.978 kg)   SpO2: 100%       Objective:   Physical Exam  Constitutional: [name], place, and time. She appears well-developed. No distress.  HENT:  Head: Normocephalic and atraumatic.  Mouth/Throat: Oropharynx is clear and moist. No oropharyngeal exudate.  Eyes: EOM are normal. Pupils are equal, round, and reactive to light.  Neck: Neck supple.  Cardiovascular: Normal rate, regular rhythm and normal heart sounds.  Exam reveals no gallop and no friction rub.   No murmur heard. Pulmonary/Chest: Effort normal and breath sounds normal. No respiratory distress. She has no wheezes. She has no rales.  Abdominal: Soft. Bowel sounds are normal. She exhibits no distension and no mass. There is no tenderness. There is no rebound and no guarding.  Musculoskeletal: Normal range of motion. She exhibits edema. She exhibits no tenderness.  Trace pretibial edema B/L.  Neurological: She is alert and oriented to person, place, and time. No cranial nerve deficit.  Skin: Skin is warm. She is not diaphoretic.  Psychiatric: She has a normal mood and affect. Her behavior is normal. Judgment and thought content normal.  Vitals reviewed.         Assessment & Plan:  Please see problem based assessment and plan.

## 2014-08-10 NOTE — Assessment & Plan Note (Addendum)
She says she is taking gabapentin occasionally, and takes it only at night because it makes her sleep. - continue Gabapentin 100mg  but rx re-written to reflect qHS dosing; she was advised to use it nightly

## 2014-08-10 NOTE — Assessment & Plan Note (Addendum)
Lab Results  Component Value Date   HGBA1C 8.0 08/10/2014   HGBA1C 7.1 05/10/2014   HGBA1C 7.2 03/01/2014     Assessment: Diabetes control:  uncontrolled Progress toward A1C goal:   deteriorated Comments:  Her meter cannot be downloaded but from memory she reports values in the past 3 months:  highest number was 267 (non-fasting), lowest was 102.  She reports feeling low at 90 and below.  She thinks her CBGs have mainly been in high 100s.  She drinks ~ 3 regular sodas in one week, V8 juice.  She says loves bread.  She eats most meals at home, rare fast food.  She walks in her neighborhood for 20-30 minutes on most days.  She plans to join the [facility] for additional exercise.  Plan: Medications:  continue current medications: INCREASE glipizide from 5mg  daily to 5mg  BID, CONTINUE metformin at 500mg  BID (she is hesitant to increase because she experienced diarrhea on higher metformin dose in the past). Home glucose monitoring:  At least daily (fasting) Frequency:   Timing:   Instruction/counseling given: reminded to bring blood glucose meter & log to each visit Educational resources provided: counseled on need to STOP sodas and decrease juice and bread, increase water, increase exercise. Other plans: RTC 1 month for follow-up.

## 2014-08-10 NOTE — Assessment & Plan Note (Addendum)
BP Readings from Last 3 Encounters:  08/10/14 132/72  06/20/14 143/64  05/10/14 145/71    Lab Results  Component Value Date   NA 142 06/13/2014   K 3.7 06/13/2014   CREATININE 1.2* 06/13/2014    Assessment: Blood pressure control:  initially slightly elevated at 144/76 but repeat 132/72.  I suspect slight elevation was due to rushing into clinic.   Progress toward BP goal:   stable Comments: She reports compliance with meds.  Plan: Medications:  continue current medications:  Lisinopril 40mg  daily, HCTZ 25mg  daily Educational resources provided: brochure (DENIES) Other plans:  BMP today. Follow-up BP next visit.

## 2014-08-22 DIAGNOSIS — Z86010 Personal history of colonic polyps: Secondary | ICD-10-CM | POA: Diagnosis not present

## 2014-08-22 DIAGNOSIS — D509 Iron deficiency anemia, unspecified: Secondary | ICD-10-CM | POA: Diagnosis not present

## 2014-08-22 DIAGNOSIS — K580 Irritable bowel syndrome with diarrhea: Secondary | ICD-10-CM | POA: Diagnosis not present

## 2014-08-22 DIAGNOSIS — Z1211 Encounter for screening for malignant neoplasm of colon: Secondary | ICD-10-CM | POA: Diagnosis not present

## 2014-08-22 DIAGNOSIS — Z800 Family history of malignant neoplasm of digestive organs: Secondary | ICD-10-CM | POA: Diagnosis not present

## 2014-08-23 ENCOUNTER — Other Ambulatory Visit: Payer: Self-pay | Admitting: Internal Medicine

## 2014-08-24 NOTE — Telephone Encounter (Signed)
Checked, yes they have it

## 2014-09-12 ENCOUNTER — Telehealth: Payer: Self-pay | Admitting: Internal Medicine

## 2014-09-12 NOTE — Telephone Encounter (Signed)
Call to patient to confirm appointment for 09/13/14 at 1:45 lmtcb

## 2014-09-13 ENCOUNTER — Encounter: Payer: Self-pay | Admitting: Internal Medicine

## 2014-09-13 ENCOUNTER — Ambulatory Visit (INDEPENDENT_AMBULATORY_CARE_PROVIDER_SITE_OTHER): Payer: Medicare Other | Admitting: Internal Medicine

## 2014-09-13 VITALS — BP 122/74 | HR 91 | Temp 98.0°F | Ht 59.0 in | Wt 165.2 lb

## 2014-09-13 DIAGNOSIS — E1165 Type 2 diabetes mellitus with hyperglycemia: Secondary | ICD-10-CM

## 2014-09-13 DIAGNOSIS — E1129 Type 2 diabetes mellitus with other diabetic kidney complication: Secondary | ICD-10-CM

## 2014-09-13 LAB — GLUCOSE, CAPILLARY: Glucose-Capillary: 88 mg/dL (ref 70–99)

## 2014-09-13 NOTE — Progress Notes (Signed)
Subjective:    Patient ID: Iran Sizer, female    DOB: Jan 23, 1946, 69 y.o.   MRN: [id number]  HPI Comments: Ms. Domek is a 69 year old woman with a PMH as below here for follow-up of her DM. Please see problem based assessment and plan for A&P.     Past Medical History  Diagnosis Date  . Allergy   . Hypertension   . Hyperlipidemia   . Fatty liver disease, nonalcoholic   . History of hyperkalemia   . Obesity   . Renal lesion   . Hx antineoplastic chemo  10/12 - 06/24/11    PACLITAX EL COMPLETED 06/24/11  . Anemia   . Diabetes mellitus ORAL MED  . OA (osteoarthritis) of knee     RIGHT LEG  . Mild nonproliferative diabetic retinopathy of right eye 03/19/11    Dr. Joseph Art  . Breast CA     (Rt) breast ca dx 4/12---  S/P RADICAL MASTECTOMY AND CHEMORADIATION  . Numbness and tingling     Hx: of in fingers and toes since chemotherapy   Current Outpatient Prescriptions on File Prior to Visit  Medication Sig Dispense Refill  . anastrozole (ARIMIDEX) 1 MG tablet TAKE 1 TABLET( 1 MG TOTAL) BY MOUTH DAILY. PLEASE TAKE CONSISTENTLY. 30 tablet 0  . atorvastatin (LIPITOR) 40 MG tablet Take 1 tablet (40 mg total) by mouth at bedtime. 30 tablet 1  . gabapentin (NEURONTIN) 100 MG capsule Take 1 capsule (100 mg total) by mouth at bedtime. 30 capsule 3  . glipiZIDE (GLUCOTROL) 5 MG tablet Take 1 tablet (5 mg total) by mouth 2 (two) times daily before a meal. 60 tablet 1  . hydrochlorothiazide (HYDRODIURIL) 25 MG tablet Take 1 tablet (25 mg total) by mouth daily. 90 tablet 3  . lisinopril (PRINIVIL,ZESTRIL) 40 MG tablet TAKE 1 TABLET BY MOUTH DAILY 90 tablet 0  . metFORMIN (GLUCOPHAGE) 500 MG tablet Take 1 tablet (500 mg total) by mouth 2 (two) times daily with a meal. 60 tablet 1  . Polyvinyl Alcohol-Povidone (REFRESH OP) Place 1 drop into both eyes daily as needed. Dry eyes     No current facility-administered medications on file prior to visit.   Review of Systems    Constitutional: Negative for appetite change and unexpected weight change.  Respiratory: Negative for shortness of breath.   Cardiovascular: Negative for chest pain, palpitations and leg swelling.  Gastrointestinal: Positive for nausea. Negative for vomiting, abdominal pain, diarrhea, constipation and blood in stool.       Occasional nausea.  Endocrine: Negative for polydipsia and polyuria.  Genitourinary: Negative for dysuria and hematuria.  Neurological: Negative for syncope, weakness and light-headedness.       Filed Vitals:   09/13/14 1343 09/13/14 1345  BP: 130/48 122/74  Pulse: 91   Temp: 98 F (36.7 C)   TempSrc: Oral   Height: 4\' 11"  (1.499 m)   Weight: 165 lb 3.2 oz (74.934 kg)   SpO2: 100%    Objective:   Physical Exam  Constitutional: [name], place, and time. She appears well-developed. No distress.  HENT:  Head: Normocephalic and atraumatic.  Mouth/Throat: Oropharynx is clear and moist. No oropharyngeal exudate.  Eyes: Conjunctivae and EOM are normal. Pupils are equal, round, and reactive to light.  Neck: Neck supple.  Cardiovascular: Normal rate, regular rhythm and normal heart sounds.  Exam reveals no gallop and no friction rub.   No murmur heard. Pulmonary/Chest: Effort normal and breath sounds  normal. No respiratory distress. She has no wheezes. She has no rales.  Abdominal: Soft. Bowel sounds are normal. She exhibits no distension and no mass. There is no tenderness. There is no rebound and no guarding.  Musculoskeletal: Normal range of motion. She exhibits no edema or tenderness.  Neurological: She is alert and oriented to person, place, and time. No cranial nerve deficit.  Skin: Skin is warm. She is not diaphoretic.  Psychiatric: She has a normal mood and affect. Her behavior is normal. Judgment and thought content normal.  Vitals reviewed.         Assessment & Plan:  Please see problem based assessment and plan for A&P.

## 2014-09-13 NOTE — Assessment & Plan Note (Addendum)
Lab Results  Component Value Date   HGBA1C 8.0 08/10/2014   HGBA1C 7.1 05/10/2014   HGBA1C 7.2 03/01/2014     Assessment: Diabetes control:  uncontrolled Progress toward A1C goal:   deteriorated based on most recent A1c Comments: CBG in clinic is 88.  We cannot download her meter so she brought a handwritten log.  She is checking her CBG.  There are only 10 recordings from 04/12-04/30 but she started checking more regularly with 9 readings in the past week.  Lowest value was 65 (associated with working out, not eating).  Highest 288.  Most of the readings are low 100s.  She denies hypoglycemic symptoms.  Started to go to [facility] for exercise.  Still drinking 2-3 sodas per week.   Plan: Medications:  continue current medications:  Glipizide 5mg  BID, metformin 500mg  BID (she has not been able to tolerate higher metformin dose in the past) Home glucose monitoring: twice daily Frequency:   Timing:   Instruction/counseling given: reminded to get eye exam (last seen 02/2014 with instruction for 6-9 month recall), reminded to bring blood glucose meter & log to each visit and discussed diet Educational resources provided:  Diabetes and food handout, Carb counting booklet Other plans: RTC in 2 months for follow-up.

## 2014-09-13 NOTE — Patient Instructions (Signed)
1. Congratulations on joining the [facility]!Marland Kitchen Please continue to work on diet and exercise.  Try and replace soda with sugar-free beverages (like water with lemon or lime).  Limit juice and biscuits.   2. Please take all medications as prescribed.    3. If you have worsening of your symptoms or new symptoms arise, please call the clinic ([phone]), or go to the ER immediately if symptoms are severe.  Please come back to see me in 2 months.  Diabetes Mellitus and Food It is important for you to manage your blood sugar (glucose) level. Your blood glucose level can be greatly affected by what you eat. Eating healthier foods in the appropriate amounts throughout the day at about the same time each day will help you control your blood glucose level. It can also help slow or prevent worsening of your diabetes mellitus. Healthy eating may even help you improve the level of your blood pressure and reach or maintain a healthy weight.  HOW CAN FOOD AFFECT ME? Carbohydrates Carbohydrates affect your blood glucose level more than any other type of food. Your dietitian will help you determine how many carbohydrates to eat at each meal and teach you how to count carbohydrates. Counting carbohydrates is important to keep your blood glucose at a healthy level, especially if you are using insulin or taking certain medicines for diabetes mellitus. Alcohol Alcohol can cause sudden decreases in blood glucose (hypoglycemia), especially if you use insulin or take certain medicines for diabetes mellitus. Hypoglycemia can be a life-threatening condition. Symptoms of hypoglycemia (sleepiness, dizziness, and disorientation) are similar to symptoms of having too much alcohol.  If your health care provider has given you approval to drink alcohol, do so in moderation and use the following guidelines:  Women should not have more than one drink per day, and men should not have more than two drinks per day. One drink is equal  to:  12 oz of beer.  5 oz of wine.  1 oz of hard liquor.  Do not drink on an empty stomach.  Keep yourself hydrated. Have water, diet soda, or unsweetened iced tea.  Regular soda, juice, and other mixers might contain a lot of carbohydrates and should be counted. WHAT FOODS ARE NOT RECOMMENDED? As you make food choices, it is important to remember that all foods are not the same. Some foods have fewer nutrients per serving than other foods, even though they might have the same number of calories or carbohydrates. It is difficult to get your body what it needs when you eat foods with fewer nutrients. Examples of foods that you should avoid that are high in calories and carbohydrates but low in nutrients include:  Trans fats (most processed foods list trans fats on the Nutrition Facts label).  Regular soda.  Juice.  Candy.  Sweets, such as cake, pie, doughnuts, and cookies.  Fried foods. WHAT FOODS CAN I EAT? Have nutrient-rich foods, which will nourish your body and keep you healthy. The food you should eat also will depend on several factors, including:  The calories you need.  The medicines you take.  Your weight.  Your blood glucose level.  Your blood pressure level.  Your cholesterol level. You also should eat a variety of foods, including:  Protein, such as meat, poultry, fish, tofu, nuts, and seeds (lean animal proteins are best).  Fruits.  Vegetables.  Dairy products, such as milk, cheese, and yogurt (low fat is best).  Breads, grains, pasta, cereal, rice, and  beans.  Fats such as olive oil, trans fat-free margarine, canola oil, avocado, and olives. DOES EVERYONE WITH DIABETES MELLITUS HAVE THE SAME MEAL PLAN? Because every person with diabetes mellitus is different, there is not one meal plan that works for everyone. It is very important that you meet with a dietitian who will help you create a meal plan that is just right for you. Document Released:  01/16/2005 Document Revised: 04/26/2013 Document Reviewed: 03/18/2013 [facility] Patient Information 2015 [place], [place]. This information is not intended to replace advice given to you by your health care provider. Make sure you discuss any questions you have with your health care provider.

## 2014-09-14 NOTE — Progress Notes (Signed)
Internal Medicine Clinic Attending  Case discussed with Dr. Wilson soon after the resident saw the patient.  We reviewed the resident's history and exam and pertinent patient test results.  I agree with the assessment, diagnosis, and plan of care documented in the resident's note.  

## 2014-09-27 DIAGNOSIS — K3189 Other diseases of stomach and duodenum: Secondary | ICD-10-CM | POA: Diagnosis not present

## 2014-09-27 DIAGNOSIS — D509 Iron deficiency anemia, unspecified: Secondary | ICD-10-CM | POA: Diagnosis not present

## 2014-09-27 DIAGNOSIS — K5730 Diverticulosis of large intestine without perforation or abscess without bleeding: Secondary | ICD-10-CM | POA: Diagnosis not present

## 2014-09-27 DIAGNOSIS — K219 Gastro-esophageal reflux disease without esophagitis: Secondary | ICD-10-CM | POA: Diagnosis not present

## 2014-09-27 DIAGNOSIS — K2980 Duodenitis without bleeding: Secondary | ICD-10-CM | POA: Diagnosis not present

## 2014-09-27 DIAGNOSIS — Z1211 Encounter for screening for malignant neoplasm of colon: Secondary | ICD-10-CM | POA: Diagnosis not present

## 2014-10-05 ENCOUNTER — Telehealth: Payer: Self-pay

## 2014-10-05 NOTE — Telephone Encounter (Signed)
Surgical pathology report small bowel biopsy dtd 10/03/14 rcvd from [facility].  Reviewed by Dr. Jana Hakim.  Sent to scan.

## 2014-10-13 ENCOUNTER — Other Ambulatory Visit: Payer: Self-pay | Admitting: Internal Medicine

## 2014-10-17 DIAGNOSIS — Z800 Family history of malignant neoplasm of digestive organs: Secondary | ICD-10-CM | POA: Diagnosis not present

## 2014-10-17 DIAGNOSIS — Z86010 Personal history of colonic polyps: Secondary | ICD-10-CM | POA: Diagnosis not present

## 2014-10-17 DIAGNOSIS — D509 Iron deficiency anemia, unspecified: Secondary | ICD-10-CM | POA: Diagnosis not present

## 2014-11-21 ENCOUNTER — Other Ambulatory Visit: Payer: Self-pay | Admitting: Oncology

## 2014-12-02 ENCOUNTER — Other Ambulatory Visit: Payer: Self-pay | Admitting: Internal Medicine

## 2014-12-20 ENCOUNTER — Ambulatory Visit (INDEPENDENT_AMBULATORY_CARE_PROVIDER_SITE_OTHER): Payer: Medicare Other | Admitting: Internal Medicine

## 2014-12-20 ENCOUNTER — Ambulatory Visit (HOSPITAL_COMMUNITY)
Admission: RE | Admit: 2014-12-20 | Discharge: 2014-12-20 | Disposition: A | Discharge status: Home / Self Care | Payer: Medicare Other | Source: Ambulatory Visit | Attending: Internal Medicine | Admitting: Internal Medicine

## 2014-12-20 ENCOUNTER — Encounter: Payer: Self-pay | Admitting: Internal Medicine

## 2014-12-20 VITALS — BP 133/86 | HR 93 | Temp 98.3°F | Ht 59.0 in | Wt 169.3 lb

## 2014-12-20 DIAGNOSIS — E1169 Type 2 diabetes mellitus with other specified complication: Secondary | ICD-10-CM | POA: Diagnosis not present

## 2014-12-20 DIAGNOSIS — M2578 Osteophyte, vertebrae: Secondary | ICD-10-CM | POA: Insufficient documentation

## 2014-12-20 DIAGNOSIS — I10 Essential (primary) hypertension: Secondary | ICD-10-CM | POA: Diagnosis not present

## 2014-12-20 DIAGNOSIS — M545 Low back pain, unspecified: Secondary | ICD-10-CM | POA: Insufficient documentation

## 2014-12-20 DIAGNOSIS — Z79899 Other long term (current) drug therapy: Secondary | ICD-10-CM

## 2014-12-20 DIAGNOSIS — E1129 Type 2 diabetes mellitus with other diabetic kidney complication: Secondary | ICD-10-CM

## 2014-12-20 DIAGNOSIS — E1159 Type 2 diabetes mellitus with other circulatory complications: Secondary | ICD-10-CM

## 2014-12-20 DIAGNOSIS — I152 Hypertension secondary to endocrine disorders: Secondary | ICD-10-CM

## 2014-12-20 DIAGNOSIS — M8938 Hypertrophy of bone, other site: Secondary | ICD-10-CM | POA: Diagnosis not present

## 2014-12-20 DIAGNOSIS — N289 Disorder of kidney and ureter, unspecified: Secondary | ICD-10-CM | POA: Diagnosis not present

## 2014-12-20 DIAGNOSIS — M47816 Spondylosis without myelopathy or radiculopathy, lumbar region: Secondary | ICD-10-CM | POA: Diagnosis not present

## 2014-12-20 LAB — GLUCOSE, CAPILLARY: Glucose-Capillary: 161 mg/dL — ABNORMAL HIGH (ref 65–99)

## 2014-12-20 LAB — POCT GLYCOSYLATED HEMOGLOBIN (HGB A1C): Hemoglobin A1C: 7.2

## 2014-12-20 MED ORDER — IBUPROFEN 600 MG PO TABS: 600.0000 mg | ORAL_TABLET | Freq: Three times a day (TID) | ORAL | Status: DC | PRN

## 2014-12-20 MED ORDER — DICLOFENAC SODIUM 1 % TD GEL: 2.0000 g | Freq: Four times a day (QID) | TRANSDERMAL | Status: DC

## 2014-12-20 NOTE — Progress Notes (Signed)
   Subjective:    Patient ID: [name], female    DOB: 08/02/1945, 69 y.o.   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  Patient reports new onset lower back pain that started about 1 week ago. She states the pain occurs on both sides but is worst on the right. She describes the pain as radiating to the front of her abdomen bilaterally. Pain is 4/10 in severity, dull, and intermittent. She denies any trauma, heavy lifting, or falls. She does note increased difficulty going up stairs secondary to pain. She denies any extremity weakness. She denies fevers, chills, or weight loss. She does have tingling/numbness in her hands and feet but this is chronic. She has not tried any OTC medications to treat her pain.    Review of Systems General: Reports hot flashes. Denies changes in appetite HEENT: Denies headaches, ear pain, changes in vision, rhinorrhea, sore throat CV: Denies CP, palpitations, SOB, orthopnea Pulm: Denies SOB, cough, wheezing GI: Denies abdominal pain, nausea, vomiting, diarrhea, constipation, melena, hematochezia GU: Denies dysuria, hematuria, frequency Msk: Denies muscle cramps, joint pains Neuro: See HPI Skin: Denies rashes, bruising    Objective:   Physical Exam General: alert, sitting up in chair, NAD HEENT: [place]/AT, EOMI, sclera anicteric, mucus membranes moist CV: RRR, no m/g/r Pulm: CTA bilaterally, breaths non-labored Abd: BS+, soft, non-tender Back: No tenderness to palpation of spine Ext: warm, no peripheral edema Neuro: alert and oriented x 3. Strength 5/5 in upper and lower extremities bilaterally. Positive straight leg test on right side, negative on left.      Assessment & Plan:  Please  refer to A&P documentation.

## 2014-12-20 NOTE — Assessment & Plan Note (Signed)
Lab Results  Component Value Date   HGBA1C 7.2 12/20/2014   HGBA1C 8.0 08/10/2014   HGBA1C 7.1 05/10/2014     Assessment: Diabetes control: Good Progress toward A1C goal:   At goal Comments: HbA1c has improved from 8.0 to 7.2 today.   Plan: Medications:  Continue Metformin 500 mg BID and Glipizide 5 mg daily Home glucose monitoring: Frequency:  Once daily Instruction/counseling given: reminded to get eye exam, reminded to bring blood glucose meter & log to each visit and reminded to bring medications to each visit Other plans:  - Patient admitted to not testing recently. I encouraged her to keep testing once daily to keep track of her blood sugar - Reminded to get eye exam- she states she had this done in June, but report in system.  - Repeat HbA1c in 3 months

## 2014-12-20 NOTE — Assessment & Plan Note (Addendum)
Patient presents with a 1 week history of new onset bilateral low back pain with the right side worst than left. No focal deficits on exam. Strength intact and symmetric in the lower extremities. In the setting of history of breast cancer there is a concern for possible metastasis given the new onset with no prior history of back pain. There may be some component of sciatica given [name] had a positive right straight leg test. Will obtain x-ray of the lumbar spine. I advised her to come back to the clinic if her pain persists after a few weeks of conservative treatment. - Obtain lumbar x-ray - Prescribed voltaren gel to be applied up to 4 times daily and ibuprofen 600 mg TID PRN - May need to get bone scan if back pain persists to rule out mets - I instructed patient to let her oncologist know of her back pain as well

## 2014-12-20 NOTE — Assessment & Plan Note (Signed)
BP 133/86 today, well controlled. - Continue HCTZ 25 mg daily and Lisinopril 40 mg daily

## 2014-12-20 NOTE — Progress Notes (Signed)
Medicine attending: Medical history, presenting problems, physical findings, and medications, reviewed with Dr Albin Felling and I concur with her evaluation and management plan. The patient presents with new onset back pain. Low intensity. No focal neurologic deficits. She does have a history of breast cancer. She is currently on hormonal therapy with Arimidex. We will get regular x-rays of the spine. If there are any abnormalities they will be followed up appropriately. If the x-rays are normal, she is advised to call if pain persists and then we will get a nuclear medicine bone scan. If this is abnormal, she will be referred back to her oncologist for further evaluation and management.

## 2014-12-20 NOTE — Patient Instructions (Addendum)
-   x-ray of low back today - If pain persists please call the clinic - Try applying voltaren gel to the affected area up to four times daily - Can take ibuprofen 600 mg up to three times daily  General Instructions:   Please bring your medicines with you each time you come to clinic.  Medicines may include prescription medications, over-the-counter medications, herbal remedies, eye drops, vitamins, or other pills.   Progress Toward Treatment Goals:  Treatment Goal 03/10/2014  Hemoglobin A1C -  Blood pressure at goal  Prevent falls at goal    Self Care Goals & Plans:  Self Care Goal 09/13/2014  Manage my medications take my medicines as prescribed; bring my medications to every visit; refill my medications on time; follow the sick day instructions if I am sick  Monitor my health -  Eat healthy foods drink diet soda or water instead of juice or soda; eat more vegetables; eat foods that are low in salt; eat baked foods instead of fried foods; eat fruit for snacks and desserts  Be physically active -  Meeting treatment goals -    Home Blood Glucose Monitoring 03/10/2014  Check my blood sugar -  When to check my blood sugar before meals     Care Management & Community Referrals:  Referral 11/02/2013  Referrals made for care management support none needed  Referrals made to community resources none

## 2014-12-22 ENCOUNTER — Telehealth: Payer: Self-pay | Admitting: *Deleted

## 2014-12-22 NOTE — Telephone Encounter (Signed)
RTC to patient message left that Clinics had returned her call.  Sander Nephew, RN 12/22/2014 12:03 PM

## 2014-12-24 ENCOUNTER — Other Ambulatory Visit: Payer: Self-pay | Admitting: Internal Medicine

## 2014-12-25 ENCOUNTER — Ambulatory Visit
Admission: RE | Admit: 2014-12-25 | Discharge: 2014-12-25 | Disposition: A | Discharge status: Home / Self Care | Payer: Medicare Other | Source: Ambulatory Visit | Attending: Oncology | Admitting: Oncology

## 2014-12-25 DIAGNOSIS — Z13820 Encounter for screening for osteoporosis: Secondary | ICD-10-CM | POA: Diagnosis not present

## 2014-12-25 DIAGNOSIS — C773 Secondary and unspecified malignant neoplasm of axilla and upper limb lymph nodes: Principal | ICD-10-CM

## 2014-12-25 DIAGNOSIS — Z1231 Encounter for screening mammogram for malignant neoplasm of breast: Secondary | ICD-10-CM | POA: Diagnosis not present

## 2014-12-25 DIAGNOSIS — Z780 Asymptomatic menopausal state: Secondary | ICD-10-CM | POA: Diagnosis not present

## 2014-12-25 DIAGNOSIS — C50919 Malignant neoplasm of unspecified site of unspecified female breast: Secondary | ICD-10-CM

## 2014-12-28 ENCOUNTER — Other Ambulatory Visit (HOSPITAL_BASED_OUTPATIENT_CLINIC_OR_DEPARTMENT_OTHER): Payer: Medicare Other

## 2014-12-28 DIAGNOSIS — C773 Secondary and unspecified malignant neoplasm of axilla and upper limb lymph nodes: Principal | ICD-10-CM

## 2014-12-28 DIAGNOSIS — C50311 Malignant neoplasm of lower-inner quadrant of right female breast: Secondary | ICD-10-CM

## 2014-12-28 DIAGNOSIS — C50919 Malignant neoplasm of unspecified site of unspecified female breast: Secondary | ICD-10-CM

## 2014-12-28 LAB — COMPREHENSIVE METABOLIC PANEL (CC13)
ALT: 29 U/L (ref 0–55)
AST: 16 U/L (ref 5–34)
Albumin: 4.2 g/dL (ref 3.5–5.0)
Alkaline Phosphatase: 79 U/L (ref 40–150)
Anion Gap: 14 mEq/L — ABNORMAL HIGH (ref 3–11)
BUN: 25.8 mg/dL (ref 7.0–26.0)
CO2: 24 mEq/L (ref 22–29)
Calcium: 9.9 mg/dL (ref 8.4–10.4)
Chloride: 103 mEq/L (ref 98–109)
Creatinine: 1.2 mg/dL — ABNORMAL HIGH (ref 0.6–1.1)
EGFR: 57 mL/min/{1.73_m2} — ABNORMAL LOW (ref >90)
Glucose: 187 mg/dl — ABNORMAL HIGH (ref 70–140)
Potassium: 3.3 mEq/L — ABNORMAL LOW (ref 3.5–5.1)
Sodium: 141 mEq/L (ref 136–145)
Total Bilirubin: 0.4 mg/dL (ref 0.20–1.20)
Total Protein: 6.8 g/dL (ref 6.4–8.3)

## 2014-12-28 LAB — CBC WITH DIFFERENTIAL/PLATELET
BASO%: 0.8 % (ref 0.0–2.0)
Basophils Absolute: 0 10*3/uL (ref 0.0–0.1)
EOS%: 1.3 % (ref 0.0–7.0)
Eosinophils Absolute: 0.1 10*3/uL (ref 0.0–0.5)
HCT: 31.9 % — ABNORMAL LOW (ref 34.8–46.6)
HGB: 10.5 g/dL — ABNORMAL LOW (ref 11.6–15.9)
LYMPH%: 27.8 % (ref 14.0–49.7)
MCH: 30.3 pg (ref 25.1–34.0)
MCHC: 32.9 g/dL (ref 31.5–36.0)
MCV: 92.2 fL (ref 79.5–101.0)
MONO#: 0.3 10*3/uL (ref 0.1–0.9)
MONO%: 5.9 % (ref 0.0–14.0)
NEUT#: 3 10*3/uL (ref 1.5–6.5)
NEUT%: 64.2 % (ref 38.4–76.8)
Platelets: 194 10*3/uL (ref 145–400)
RBC: 3.46 10*6/uL — ABNORMAL LOW (ref 3.70–5.45)
RDW: 17 % — ABNORMAL HIGH (ref 11.2–14.5)
WBC: 4.7 10*3/uL (ref 3.9–10.3)
lymph#: 1.3 10*3/uL (ref 0.9–3.3)

## 2015-01-01 ENCOUNTER — Other Ambulatory Visit: Payer: Self-pay | Admitting: Internal Medicine

## 2015-01-02 ENCOUNTER — Other Ambulatory Visit: Payer: Self-pay | Admitting: *Deleted

## 2015-01-02 ENCOUNTER — Ambulatory Visit (HOSPITAL_BASED_OUTPATIENT_CLINIC_OR_DEPARTMENT_OTHER): Payer: Medicare Other | Admitting: Oncology

## 2015-01-02 VITALS — BP 156/73 | HR 78 | Temp 97.7°F | Resp 18 | Ht 59.0 in | Wt 169.2 lb

## 2015-01-02 DIAGNOSIS — C773 Secondary and unspecified malignant neoplasm of axilla and upper limb lymph nodes: Secondary | ICD-10-CM | POA: Diagnosis not present

## 2015-01-02 DIAGNOSIS — C50311 Malignant neoplasm of lower-inner quadrant of right female breast: Secondary | ICD-10-CM | POA: Diagnosis not present

## 2015-01-02 DIAGNOSIS — C50911 Malignant neoplasm of unspecified site of right female breast: Secondary | ICD-10-CM

## 2015-01-02 DIAGNOSIS — E876 Hypokalemia: Secondary | ICD-10-CM | POA: Diagnosis not present

## 2015-01-02 DIAGNOSIS — G629 Polyneuropathy, unspecified: Secondary | ICD-10-CM

## 2015-01-02 DIAGNOSIS — Z170 Estrogen receptor positive status [ER+]: Secondary | ICD-10-CM | POA: Diagnosis not present

## 2015-01-02 DIAGNOSIS — M1990 Unspecified osteoarthritis, unspecified site: Secondary | ICD-10-CM

## 2015-01-02 MED ORDER — POTASSIUM CHLORIDE ER 10 MEQ PO CPCR: 10.0000 meq | ORAL_CAPSULE | Freq: Every day | ORAL | Status: DC

## 2015-01-02 MED ORDER — GABAPENTIN 100 MG PO CAPS: 100.0000 mg | ORAL_CAPSULE | Freq: Three times a day (TID) | ORAL | Status: DC

## 2015-01-02 MED ORDER — ANASTROZOLE 1 MG PO TABS: 1.0000 mg | ORAL_TABLET | Freq: Every day | ORAL | Status: DC

## 2015-01-02 NOTE — Progress Notes (Signed)
ID: [name]   DOB: [birth year]  MR#: [id number]  WUJ#:[id number]   PCP: Jillian Peat, DO GYN: [name] SU: [name] OTHER MD: [name], [name]  CHIEF COMPLAINT: Right breast cancer s/p mastectomy   CURRENT THERAPY: Anastrozole  BREAST CANCER HISTORY : From the original intake note:  [name] had screening mammography Sep 11, 2010, at [facility]. This showed a possible right axillary mass and she was brought back for additional views Sep 20, 2010. An ultrasound showed abnormal enlarged lymph nodes in the right axilla measuring a maximum of 2.2 cm. Biopsy was suggested and performed on Sep 25, 2010. The pathology from that procedure ([id number]) showed metastatic adenocarcinoma consistent with a breast primary. In particular, the tumor was estrogen and progesterone receptor positive, HER2/neu was negative, the MIB-1 was 40%.  With this information, the patient was referred for bilateral breast MRIs performed May 29. This showed a complex picture, there being in addition to 2 pathologically enlarged lymph nodes in the right axilla and then other mildly prominent lymph nodes, 2 masses in the breast itself, one was subareolar and measured 1.3 cm, the second mass was more posterior and inferior and measured 8 mm. The distance between these 2 masses was 4.5 cm. the second breast mass was biopsied and was found to be benign.  She has a deleterious BRCA-2 mutation, namely 6696 deletion TC.  Status post right mastectomy and axillary lymph node dissection December 04, 2010 for a 1.5 cm invasive ductal carcinoma, grade 3, involving 3/12 axillary lymph nodes, with ample margins, T1c N1 M0, estrogen receptor 94% positive, progesterone receptor 4% positive with an MIB-1 of 62% and no HER2 amplification on the original material. Further therapy as detailed below.  INTERVAL HISTORY:  [name] returns today for follow up of her right breast cancer. She continues on anastrozole, which she  tolerates well. She does have some hot flashes. She rarely has night sweats. She obtains it for approximately 9 or $10 a month. She just had a bone density which shows normal bone density. This is very favorable.  REVIEW OF SYSTEMS:  [name] has had some back and hip pain which were evaluated with plain films. These show arthritis. She'll need some dental care. She still has neuropathy involving her fingers and toes and she takes gabapentin for this. She would like to increase it to 3 times a day instead of just at bedtime. She tells me she is able to take it without it making her too sleepy. Aside from these issues certain food smells really nauseate her and then she cannot eat the food. This is very selective, of course. A detailed review of systems today was otherwise stable  PAST MEDICAL HISTORY: Past Medical History  Diagnosis Date  . Allergy   . Hypertension   . Hyperlipidemia   . Fatty liver disease, nonalcoholic   . History of hyperkalemia   . Obesity   . Renal lesion   . Hx antineoplastic chemo  10/12 - 06/24/11    PACLITAX EL COMPLETED 06/24/11  . Anemia   . Diabetes mellitus ORAL MED  . OA (osteoarthritis) of knee     RIGHT LEG  . Mild nonproliferative diabetic retinopathy of right eye 03/19/11    Dr. Norva Ryan  . Breast CA     (Rt) breast ca dx 4/12---  S/P RADICAL MASTECTOMY AND CHEMORADIATION  . Numbness and tingling     Hx: of in fingers and toes since chemotherapy    PAST  SURGICAL HISTORY: Past Surgical History  Procedure Laterality Date  . Knee arthroscopy  10-28-1999    RIGHT  . Mastectomy modified radical  12-04-2010    W/ LEFT PAC PLACEMENT (RIGHT BREAST W/ AXILLARY CONTENTS/ NODE BX'S  . Transthoracic echocardiogram  10-29-2010    NORMAL LVF/ EF 55-60%/ MILD MV REGURG  . Vaginal hysterectomy  AGE 12    W/ LSO  . Tubal ligation  YRS AGO  . Laparoscopy with laparoscopic right salpingo oophorectomy and lysis of pelvic and abdominal adhesions  March 2013     [name]   . Portacath placement      REPLACED [facility] DUE TO MALFUNCTION (LEFT)  . Port-a-cath removal  09/24/2011    Procedure: REMOVAL PORT-A-CATH;  Surgeon: Jillian Pu. Cornett, MD;  Location: [facility] ORS;  Service: General;  Laterality: N/A;  . Colonoscopy w/ polypectomy      Hx; of  . Total knee arthroplasty Right 08/16/2012    Procedure: TOTAL KNEE ARTHROPLASTY;  Surgeon: Jillian Lewandowsky, MD;  Location: [facility] OR;  Service: Orthopedics;  Laterality: Right;  . I&d knee with poly exchange Right 09/03/2012    Procedure: IRRIGATION AND DEBRIDEMENT RIGHT KNEE WITH POLY EXCHANGE;  Surgeon: Jillian Lewandowsky, MD;  Location: [facility] OR;  Service: Orthopedics;  Laterality: Right;    FAMILY HISTORY Family History  Problem Relation Age of Onset  . Stroke Father   . Heart disease Sister   . Cancer Sister     breast  . Heart disease Brother   . Cancer Sister     COLON CANCER  patient's mother alive age 29; father deceased, cause unknown. The patient has 8 sisters and 9 brothers.  GYNECOLOGIC HISTORY: GX, P4, first pregnancy age 58; hysterectomy age 37 with USO; right USO 2013; on premarin >20 years, discontinued at the time of breast cancer diagnosis  SOCIAL HISTORY: retired from office work; husband [name] works as a Curator; 4 daughters, 3 in [place] {[name], [name] and [name]), 1 in [place] ([name]); 3 grandsons; attends [employer].   ADVANCED DIRECTIVES: her husband is her HCPOA  HEALTH MAINTENANCE: Social History  Substance Use Topics  . Smoking status: Never Smoker   . Smokeless tobacco: Never Used  . Alcohol Use: No     Colonoscopy: Approx 2011  PAP:  UTD   Bone density: August 2013;Young Adult T Score: -1.3  Lipid panel: followed by PCP, UTD  Allergies  Allergen Reactions  . Metformin Other (See Comments)    REACTION, diarrhea: 850 mg - diarrhea but tolerates 500 mg daily    Current Outpatient Prescriptions  Medication Sig Dispense Refill  . anastrozole (ARIMIDEX) 1 MG  tablet TAKE 1 TABLET( 1 MG TOTAL) BY MOUTH DAILY. PLEASE TAKE CONSISTENTLY. 30 tablet 0  . anastrozole (ARIMIDEX) 1 MG tablet TAKE 1 TABLET BY MOUTH EVERY DAY. PLEASE TAKE CONSISTENTLY. 90 tablet 0  . atorvastatin (LIPITOR) 40 MG tablet Take 1 tablet (40 mg total) by mouth at bedtime. 30 tablet 1  . diclofenac sodium (VOLTAREN) 1 % GEL Apply 2 g topically 4 (four) times daily. 1 Tube 0  . gabapentin (NEURONTIN) 100 MG capsule Take 1 capsule (100 mg total) by mouth at bedtime. 30 capsule 3  . glipiZIDE (GLUCOTROL) 5 MG tablet TAKE 1 TABLET(5 MG) BY MOUTH TWICE DAILY BEFORE A MEAL 60 tablet 3  . hydrochlorothiazide (HYDRODIURIL) 25 MG tablet Take 1 tablet (25 mg total) by mouth daily. 90 tablet 3  . ibuprofen (ADVIL,MOTRIN) 600 MG tablet Take  1 tablet (600 mg total) by mouth every 8 (eight) hours as needed for moderate pain. 30 tablet 0  . lisinopril (PRINIVIL,ZESTRIL) 40 MG tablet TAKE 1 TABLET BY MOUTH DAILY 90 tablet 1  . metFORMIN (GLUCOPHAGE) 500 MG tablet TAKE 1 TABLET(500 MG) BY MOUTH TWICE DAILY WITH A MEAL 60 tablet 3  . Polyvinyl Alcohol-Povidone (REFRESH OP) Place 1 drop into both eyes daily as needed. Dry eyes     No current facility-administered medications for this visit.    OBJECTIVE: Middle-aged [place] American woman who appears stated age 38 Vitals:   01/02/15 1423  BP: 156/73  Pulse: 78  Temp: 97.7 F (36.5 C)  Resp: 18     Body mass index is 34.16 kg/(m^2).    ECOG FS: 1 Filed Weights   01/02/15 1423  Weight: 169 lb 3.2 oz (76.749 kg)   Sclerae unicteric, EOMs intact Oropharynx clear and moist No cervical or supraclavicular adenopathy Lungs no rales or rhonchi Heart regular rate and rhythm Abd soft, obese, nontender, positive bowel sounds MSK no focal spinal tenderness, no upper extremity lymphedema Neuro: nonfocal, well oriented, appropriate affect Breasts: The right breast is status post mastectomy and radiation. There is no evidence of local recurrence. The  right axilla is benign per the left breast is unremarkable.   LAB RESULTS: Lab Results  Component Value Date   WBC 4.7 12/28/2014   NEUTROABS 3.0 12/28/2014   HGB 10.5* 12/28/2014   HCT 31.9* 12/28/2014   MCV 92.2 12/28/2014   PLT 194 12/28/2014      Chemistry      Component Value Date/Time   NA 141 12/28/2014 0939   NA 141 08/10/2014 1009   K 3.3* 12/28/2014 0939   K 3.5 08/10/2014 1009   CL 101 08/10/2014 1009   CL 100 05/27/2012 0947   CO2 24 12/28/2014 0939   CO2 26 08/10/2014 1009   BUN 25.8 12/28/2014 0939   BUN 17 08/10/2014 1009   CREATININE 1.2* 12/28/2014 0939   CREATININE 1.01 08/10/2014 1009   CREATININE 1.60* 09/17/2012 0156   GLU 132* 03/25/2011 0957      Component Value Date/Time   CALCIUM 9.9 12/28/2014 0939   CALCIUM 9.5 08/10/2014 1009   ALKPHOS 79 12/28/2014 0939   ALKPHOS 72 11/02/2013 1448   AST 16 12/28/2014 0939   AST 18 11/02/2013 1448   ALT 29 12/28/2014 0939   ALT 22 11/02/2013 1448   BILITOT 0.40 12/28/2014 0939   BILITOT 0.3 11/02/2013 1448       Lab Results  Component Value Date   LABCA2 12 05/27/2012    STUDIES: Dg Lumbar Spine Complete  12/20/2014   CLINICAL DATA:  Lower back pain for 1 week. Pain goes from lower right side down into right hip/buttock area. Possible lifting injury, initial encounter.  EXAM: LUMBAR SPINE - COMPLETE 4+ VIEW  COMPARISON:  None.  FINDINGS: S1 appears transitional. Alignment is anatomic. Vertebral body and disc space height are maintained. Multilevel anterior osteophytosis. Facet hypertrophy in the mid and lower lumbar spine. No definite pars defects.  IMPRESSION: Multilevel osteophytosis and facet hypertrophy.   Electronically Signed   By: Leanna Battles M.D.   On: 12/20/2014 15:40   Dg Bone Density  12/25/2014   CLINICAL DATA:  Postmenopausal. The patient is taking anastrozole for breast cancer.  EXAM: DUAL X-RAY ABSORPTIOMETRY (DXA) FOR BONE MINERAL DENSITY  FINDINGS: LEFT FEMUR NECK  Bone Mineral  Density (BMD):  0.863 g/cm2  Young Adult T-Score: 0.1  Z-Score:  0.7  LEFT FOREARM (1/3 RADIUS)  Bone Mineral Density (BMD):  0.640  Young Adult T Score:  -0.9  Z Score:  0.9  ASSESSMENT: Patient's diagnostic category is NORMAL by WHO Criteria.  FRACTURE RISK: NOT INCREASED  FRAX: World Health Organization FRAX assessment of absolute fracture risk is not calculated for this patient because the patient has normal bone density.  COMPARISON: Previous examinations, the most recent dated 12/19/2013. Since that time, there has been no statistically significant change in bone mineral density of the left hip or left forearm.  Effective therapies are available in the form of bisphosphonates, selective estrogen receptor modulators, biologic agents, and hormone replacement therapy (for women). All patients should ensure an adequate intake of dietary calcium (1200 mg daily) and vitamin D (800 IU daily) unless contraindicated.  All treatment decisions require clinical judgment and consideration of individual patient factors, including patient preferences, co-morbidities, previous drug use, risk factors not captured in the FRAX model (e.g., frailty, falls, vitamin D deficiency, increased bone turnover, interval significant decline in bone density) and possible under- or over-estimation of fracture risk by FRAX.  The National Osteoporosis Foundation recommends that FDA-approved medical therapies be considered in postmenopausal women and men age 16 or older with a:  1. Hip or vertebral (clinical or morphometric) fracture.  2. T-score of -2.5 or lower at the spine or hip.  3. Ten-year fracture probability by FRAX of 3% or greater for hip fracture or 20% or greater for major osteoporotic fracture.  People with diagnosed cases of osteoporosis or at high risk for fracture should have regular bone mineral density tests. For patients eligible for Medicare, routine testing is allowed once every 2 years. The testing frequency can be  increased to one year for patients who have rapidly progressing disease, those who are receiving or discontinuing medical therapy to restore bone mass, or have additional risk factors.  World Science writer [facility]) Criteria:  Normal: T-scores from +1.0 to -1.0  Low Bone Mass (Osteopenia): T-scores between -1.0 and -2.5  Osteoporosis: T-scores -2.5 and below  Comparison to Reference Population:  T-score is the key measure used in the diagnosis of osteoporosis and relative risk determination for fracture. It provides a value for bone mass relative to the mean bone mass of a young adult reference population expressed in terms of standard deviation (SD).  Z-score is the age-matched score showing the patient's values compared to a population matched for age, sex, and race. This is also expressed in terms of standard deviation. The patient may have values that compare favorably to the age-matched values and still be at increased risk for fracture.   Electronically Signed   By: Beckie Salts M.D.   On: 12/25/2014 21:15   Mm Screening Breast Tomo Uni L  12/25/2014   CLINICAL DATA:  Screening.  EXAM: DIGITAL SCREENING UNILATERAL LEFT MAMMOGRAM WITH TOMO AND CAD  COMPARISON:  Previous exam(s).  ACR Breast Density Category b: There are scattered areas of fibroglandular density.  FINDINGS: There are no findings suspicious for malignancy. Images were processed with CAD.  IMPRESSION: No mammographic evidence of malignancy. A result letter of this screening mammogram will be mailed directly to the patient.  RECOMMENDATION: Screening mammogram in one year. (Code:SM-B-01Y)  BI-RADS CATEGORY  1: Negative.   Electronically Signed   By: Baird Lyons M.D.   On: 12/25/2014 14:08     ASSESSMENT:69 y.o.  BRCA-2 positive [employer] woman,  (1) status post right mastectomy and axillary lymph node  dissection August of 2012 for a T1c N1 (Stage IIA) invasive ductal carcinoma, grade 3, estrogen and progesterone receptor positive,  HER-2 negative, with an MIB-1-1 of 62%,   (2) Status post 4 cycles of doxorubicin and cyclophosphamide given in dose dense fashion, followed  by weekly paclitaxel x 12 completed 06/24/2011  (3) received post-mastectomy radiation completed April 2013  (4)  started on anastrozole in May 2013  (a) bone density scan on 12/25/2014 was normal with a T score of -0.9.  (5) s/p remote hysterectomy and left salpingo-oophorectomy; s/p right salpingo-oophorectomy March 2013 with benign pathology    PLAN:  Kaidance is now 4 years out from her definitive surgery, and 3 years into her anastrozole. The plan is to continue anastrozole for 5 years. A repeat bone density is normal, which is very favorable.  I gave her copies of her lower spine and hip films which show arthritis. She is already a Y member. I gave her a copy of the [employer] program pamphlet and encouraged her to participate in that, which I think would help her with that problem.  She would like to increase the Neurontin 200 mg 3 times a day for peripheral neuropathy and I was glad to do that for her.  Also, she was potassium low today. She is on hydrochlorothiazide, without potassium supplementation. I suggested she eat fruit daily but she prefers potassium liquid and we are calling that in for her at 10 mEq daily.  She will see our breast survivorship nurse practitioner in a year. She will return to see me 2 years from now. At that point she will likely "graduate" from cancer follow-up here.  She knows to call for any problems that may develop before her next visit.   Amareon Phung C    01/02/2015

## 2015-01-03 ENCOUNTER — Telehealth: Payer: Self-pay | Admitting: Oncology

## 2015-01-03 NOTE — Telephone Encounter (Signed)
Mailed calendar for August 2017. Will see Dr. Jana Hakim in two years

## 2015-01-04 ENCOUNTER — Other Ambulatory Visit: Payer: Self-pay | Admitting: *Deleted

## 2015-01-04 ENCOUNTER — Telehealth: Payer: Self-pay

## 2015-01-04 DIAGNOSIS — E1159 Type 2 diabetes mellitus with other circulatory complications: Secondary | ICD-10-CM

## 2015-01-04 DIAGNOSIS — I10 Essential (primary) hypertension: Principal | ICD-10-CM

## 2015-01-04 DIAGNOSIS — I152 Hypertension secondary to endocrine disorders: Secondary | ICD-10-CM

## 2015-01-05 MED ORDER — HYDROCHLOROTHIAZIDE 25 MG PO TABS: 25.0000 mg | ORAL_TABLET | Freq: Every day | ORAL | Status: DC

## 2015-01-05 NOTE — Telephone Encounter (Signed)
Charting error.

## 2015-01-10 ENCOUNTER — Encounter: Payer: Self-pay | Admitting: Internal Medicine

## 2015-01-10 ENCOUNTER — Other Ambulatory Visit: Payer: Self-pay | Admitting: Oncology

## 2015-01-10 ENCOUNTER — Ambulatory Visit (INDEPENDENT_AMBULATORY_CARE_PROVIDER_SITE_OTHER): Payer: Medicare Other | Admitting: Internal Medicine

## 2015-01-10 VITALS — BP 154/82 | HR 90 | Temp 98.7°F | Wt 167.3 lb

## 2015-01-10 DIAGNOSIS — M545 Low back pain: Secondary | ICD-10-CM | POA: Diagnosis not present

## 2015-01-10 DIAGNOSIS — C50919 Malignant neoplasm of unspecified site of unspecified female breast: Secondary | ICD-10-CM

## 2015-01-10 DIAGNOSIS — R21 Rash and other nonspecific skin eruption: Secondary | ICD-10-CM

## 2015-01-10 MED ORDER — HYDROXYZINE HCL 25 MG PO TABS: 25.0000 mg | ORAL_TABLET | Freq: Three times a day (TID) | ORAL | Status: DC | PRN

## 2015-01-10 MED ORDER — DIPHENHYDRAMINE-ZINC ACETATE 2-0.1 % EX CREA: 1.0000 "application " | TOPICAL_CREAM | Freq: Three times a day (TID) | CUTANEOUS | Status: DC | PRN

## 2015-01-10 NOTE — Assessment & Plan Note (Signed)
Pt denies any back pain. Xray 8/17 neg for malignancy, notable for multilevel osteophytosis and facet hypertrophy.

## 2015-01-10 NOTE — Progress Notes (Signed)
   Subjective:    Patient ID: [name], female    DOB: [birth year], 69 y.o.   MRN: [id number]  HPI 69 y/o F w/ PMHx of HTN, DM2, OA, HLD, and osteopenia who presents to clinic for a rash. Please see problem list for further details.      Review of Systems  Constitutional: Negative for fever.  HENT: Negative for sore throat, trouble swallowing and voice change.   Respiratory: Negative for choking and shortness of breath.   Cardiovascular: Negative for chest pain.  Musculoskeletal: Negative for back pain.  Skin: Positive for rash.  Allergic/Immunologic: Positive for environmental allergies.       Objective:   Physical Exam  Constitutional: She appears well-developed and well-nourished. No distress.  HENT:  Head: Normocephalic and atraumatic.  Mouth/Throat: Oropharynx is clear and moist. No oropharyngeal exudate.  Eyes: Conjunctivae are normal.  Cardiovascular: Normal rate, regular rhythm and normal heart sounds.   No murmur heard. Pulmonary/Chest: Effort normal and breath sounds normal. No respiratory distress. She has no wheezes. She has no rales.  Abdominal: Soft. Bowel sounds are normal.  Musculoskeletal: She exhibits no edema.  Skin: Skin is warm and dry. Rash (sporadic diffuse pink bumps over front and back torso, 1 pink bump on rt anterior shin.) noted.          Assessment & Plan:  Please see problem based assessment and plan.

## 2015-01-10 NOTE — Assessment & Plan Note (Addendum)
Pt woke up this morning with a skin rash that is itchy. She used a different type of Dial soap last night that was a white bar instead of her normal gold bar. She also started taking Micro K potassium supplements starting 3 days ago for a K of 3.3. She has previously tolerated liquid potassium and KDur. Denies fevers, pain, sick contacts, being outdoors, bug bites and pets. On exam she has sporadic pink bumps over front and back torso with one pink bump on rt lower leg. No specific pattern such as in pityriasis rosea. Most likely contact dermatitis from new soap she used last night. Could also be due fixed drug eruption from potassium supplements that she recently started.   - rx for vistaril 25mg  TID prn for itching and topical benadryl - stopped potassium supplement which as rash as its side effect, although unlikely causing rash however want to eliminate all possible offending agents - f/u in clinic if symptoms do not improve otherwise f/u in 3 months for DM.

## 2015-01-10 NOTE — Patient Instructions (Signed)
You can take vistaril 25 mg three times a day as need for itching. You can also apply topical benadryl to areas of skin itching for symptom relief.   Stop taking your potassium supplement.   If you have difficulty breathing or throat swelling go to the ED.    Contact Dermatitis Contact dermatitis is a reaction to certain substances that touch the skin. Contact dermatitis can be either irritant contact dermatitis or allergic contact dermatitis. Irritant contact dermatitis does not require previous exposure to the substance for a reaction to occur.Allergic contact dermatitis only occurs if you have been exposed to the substance before. Upon a repeat exposure, your body reacts to the substance.  CAUSES  Many substances can cause contact dermatitis. Irritant dermatitis is most commonly caused by repeated exposure to mildly irritating substances, such as:  Makeup.  Soaps.  Detergents.  Bleaches.  Acids.  Metal salts, such as nickel. Allergic contact dermatitis is most commonly caused by exposure to:  Poisonous plants.  Chemicals (deodorants, shampoos).  Jewelry.  Latex.  Neomycin in triple antibiotic cream.  Preservatives in products, including clothing. SYMPTOMS  The area of skin that is exposed may develop:  Dryness or flaking.  Redness.  Cracks.  Itching.  Pain or a burning sensation.  Blisters. With allergic contact dermatitis, there may also be swelling in areas such as the eyelids, mouth, or genitals.  DIAGNOSIS  Your caregiver can usually tell what the problem is by doing a physical exam. In cases where the cause is uncertain and an allergic contact dermatitis is suspected, a patch skin test may be performed to help determine the cause of your dermatitis. TREATMENT Treatment includes protecting the skin from further contact with the irritating substance by avoiding that substance if possible. Barrier creams, powders, and gloves may be helpful. Your caregiver  may also recommend:  Steroid creams or ointments applied 2 times daily. For best results, soak the rash area in cool water for 20 minutes. Then apply the medicine. Cover the area with a plastic wrap. You can store the steroid cream in the refrigerator for a "chilly" effect on your rash. That may decrease itching. Oral steroid medicines may be needed in more severe cases.  Antibiotics or antibacterial ointments if a skin infection is present.  Antihistamine lotion or an antihistamine taken by mouth to ease itching.  Lubricants to keep moisture in your skin.  Burow's solution to reduce redness and soreness or to dry a weeping rash. Mix one packet or tablet of solution in 2 cups cool water. Dip a clean washcloth in the mixture, wring it out a bit, and put it on the affected area. Leave the cloth in place for 30 minutes. Do this as often as possible throughout the day.  Taking several cornstarch or baking soda baths daily if the area is too large to cover with a washcloth. Harsh chemicals, such as alkalis or acids, can cause skin damage that is like a burn. You should flush your skin for 15 to 20 minutes with cold water after such an exposure. You should also seek immediate medical care after exposure. Bandages (dressings), antibiotics, and pain medicine may be needed for severely irritated skin.  HOME CARE INSTRUCTIONS  Avoid the substance that caused your reaction.  Keep the area of skin that is affected away from hot water, soap, sunlight, chemicals, acidic substances, or anything else that would irritate your skin.  Do not scratch the rash. Scratching may cause the rash to become infected.  You may take cool baths to help stop the itching.  Only take over-the-counter or prescription medicines as directed by your caregiver.  See your caregiver for follow-up care as directed to make sure your skin is healing properly. SEEK MEDICAL CARE IF:   Your condition is not better after 3 days of  treatment.  You seem to be getting worse.  You see signs of infection such as swelling, tenderness, redness, soreness, or warmth in the affected area.  You have any problems related to your medicines. Document Released: 04/18/2000 Document Revised: 07/14/2011 Document Reviewed: 09/24/2010 [facility] Patient Information 2015 [place], [place]. This information is not intended to replace advice given to you by your health care provider. Make sure you discuss any questions you have with your health care provider.

## 2015-01-11 NOTE — Progress Notes (Signed)
Internal Medicine Clinic Attending  Case discussed with Dr. Truong at the time of the visit.  We reviewed the resident's history and exam and pertinent patient test results.  I agree with the assessment, diagnosis, and plan of care documented in the resident's note.  

## 2015-03-28 ENCOUNTER — Other Ambulatory Visit: Payer: Self-pay | Admitting: Internal Medicine

## 2015-04-11 ENCOUNTER — Other Ambulatory Visit: Payer: Self-pay | Admitting: Internal Medicine

## 2015-04-11 ENCOUNTER — Ambulatory Visit (INDEPENDENT_AMBULATORY_CARE_PROVIDER_SITE_OTHER): Payer: Medicare Other | Admitting: Internal Medicine

## 2015-04-11 ENCOUNTER — Encounter: Payer: Self-pay | Admitting: Internal Medicine

## 2015-04-11 VITALS — BP 132/74 | HR 89 | Temp 98.5°F | Ht 59.0 in | Wt 166.5 lb

## 2015-04-11 DIAGNOSIS — N289 Disorder of kidney and ureter, unspecified: Secondary | ICD-10-CM

## 2015-04-11 DIAGNOSIS — Z7984 Long term (current) use of oral hypoglycemic drugs: Secondary | ICD-10-CM | POA: Diagnosis not present

## 2015-04-11 DIAGNOSIS — E785 Hyperlipidemia, unspecified: Secondary | ICD-10-CM

## 2015-04-11 DIAGNOSIS — I152 Hypertension secondary to endocrine disorders: Secondary | ICD-10-CM

## 2015-04-11 DIAGNOSIS — E1129 Type 2 diabetes mellitus with other diabetic kidney complication: Secondary | ICD-10-CM

## 2015-04-11 DIAGNOSIS — E1142 Type 2 diabetes mellitus with diabetic polyneuropathy: Secondary | ICD-10-CM | POA: Diagnosis not present

## 2015-04-11 DIAGNOSIS — E1159 Type 2 diabetes mellitus with other circulatory complications: Secondary | ICD-10-CM | POA: Diagnosis not present

## 2015-04-11 DIAGNOSIS — I10 Essential (primary) hypertension: Secondary | ICD-10-CM

## 2015-04-11 DIAGNOSIS — Z0000 Encounter for general adult medical examination without abnormal findings: Secondary | ICD-10-CM

## 2015-04-11 DIAGNOSIS — R21 Rash and other nonspecific skin eruption: Secondary | ICD-10-CM

## 2015-04-11 DIAGNOSIS — G6289 Other specified polyneuropathies: Secondary | ICD-10-CM

## 2015-04-11 LAB — POCT GLYCOSYLATED HEMOGLOBIN (HGB A1C): Hemoglobin A1C: 7.4

## 2015-04-11 LAB — GLUCOSE, CAPILLARY: Glucose-Capillary: 134 mg/dL — ABNORMAL HIGH (ref 65–99)

## 2015-04-11 MED ORDER — METFORMIN HCL 500 MG PO TABS: ORAL_TABLET | ORAL | Status: DC

## 2015-04-11 MED ORDER — HYDROCHLOROTHIAZIDE 25 MG PO TABS: 25.0000 mg | ORAL_TABLET | Freq: Every day | ORAL | Status: DC

## 2015-04-11 MED ORDER — ATORVASTATIN CALCIUM 40 MG PO TABS: 40.0000 mg | ORAL_TABLET | Freq: Every day | ORAL | Status: DC

## 2015-04-11 NOTE — Assessment & Plan Note (Signed)
Assessment:  Resolved.  She is no longer using benadryl or Atarax.

## 2015-04-11 NOTE — Assessment & Plan Note (Signed)
Lab Results  Component Value Date   HGBA1C 7.4 04/11/2015   HGBA1C 7.2 12/20/2014   HGBA1C 8.0 08/10/2014     Assessment: Diabetes control:  fair control Progress toward A1C goal:    Comments: Reports compliance with glipizide 5mg  BID and metformin 500mg  BID (has not been able to tolerate high metformin doses due to diarrhea).  She did not bring her meter today but reports values mostly in 100s.  Admits to decreased physical activity (not going [facility]) because of increased responsibility caring for her mother.  Diet ok (including vegetables) but still drinking soda on occasion.  She has not had CBGs below 80 and rarely feels low.  She says she saw the eye doctor about six months ago.  Plan: Medications:  continue current medications:  Glipizide 5mg  BID with meals and metformin 500mg  BID Home glucose monitoring:  yes Frequency:   At least BID Timing:   Instruction/counseling given: reminded to bring blood glucose meter & log to each visit Educational resources provided: brochure, handout Self management tools provided:   Other plans: will send for eye records.  She will work on limiting sodas and try and increase activity.  DM foot exam next visit. RTC in 3 months for follow-up.

## 2015-04-11 NOTE — Assessment & Plan Note (Addendum)
Flu shot:  Declines PNA shot, DM foot exam:  Next visit. Lipid panel:  She ate egg and cheese recently so will hold off on this until next visit and plan for fasting LP.  She is already on atorvastatin 40mg  daily.

## 2015-04-11 NOTE — Assessment & Plan Note (Signed)
Assessment:  Numbness and tingling better with increase dose of gabapentin (200mg  BID). Plan:  Continue to work on good DM control.  Continue gabapentin 200mg  BID and we can plan to titrate up in the future as needed.

## 2015-04-11 NOTE — Patient Instructions (Addendum)
1. Please continue to work on increasing walking/exercise and healthy eating to help with keeping your diabetes under control.   2. Please take all medications as prescribed.    3. If you have worsening of your symptoms or new symptoms arise, please call the clinic [phone]), or go to the ER immediately if symptoms are severe.  Come back to see me in 3 months or sooner if you have problems.  Diabetes Mellitus and Food It is important for you to manage your blood sugar (glucose) level. Your blood glucose level can be greatly affected by what you eat. Eating healthier foods in the appropriate amounts throughout the day at about the same time each day will help you control your blood glucose level. It can also help slow or prevent worsening of your diabetes mellitus. Healthy eating may even help you improve the level of your blood pressure and reach or maintain a healthy weight.  General recommendations for healthful eating and cooking habits include:  Eating meals and snacks regularly. Avoid going long periods of time without eating to lose weight.  Eating a diet that consists mainly of plant-based foods, such as fruits, vegetables, nuts, legumes, and whole grains.  Using low-heat cooking methods, such as baking, instead of high-heat cooking methods, such as deep frying. Work with your dietitian to make sure you understand how to use the Nutrition Facts information on food labels. HOW CAN FOOD AFFECT ME? Carbohydrates Carbohydrates affect your blood glucose level more than any other type of food. Your dietitian will help you determine how many carbohydrates to eat at each meal and teach you how to count carbohydrates. Counting carbohydrates is important to keep your blood glucose at a healthy level, especially if you are using insulin or taking certain medicines for diabetes mellitus. Alcohol Alcohol can cause sudden decreases in blood glucose (hypoglycemia), especially if you use insulin or  take certain medicines for diabetes mellitus. Hypoglycemia can be a life-threatening condition. Symptoms of hypoglycemia (sleepiness, dizziness, and disorientation) are similar to symptoms of having too much alcohol.  If your health care provider has given you approval to drink alcohol, do so in moderation and use the following guidelines:  Women should not have more than one drink per day, and men should not have more than two drinks per day. One drink is equal to:  12 oz of beer.  5 oz of wine.  1 oz of hard liquor.  Do not drink on an empty stomach.  Keep yourself hydrated. Have water, diet soda, or unsweetened iced tea.  Regular soda, juice, and other mixers might contain a lot of carbohydrates and should be counted. WHAT FOODS ARE NOT RECOMMENDED? As you make food choices, it is important to remember that all foods are not the same. Some foods have fewer nutrients per serving than other foods, even though they might have the same number of calories or carbohydrates. It is difficult to get your body what it needs when you eat foods with fewer nutrients. Examples of foods that you should avoid that are high in calories and carbohydrates but low in nutrients include:  Trans fats (most processed foods list trans fats on the Nutrition Facts label).  Regular soda.  Juice.  Candy.  Sweets, such as cake, pie, doughnuts, and cookies.  Fried foods. WHAT FOODS CAN I EAT? Eat nutrient-rich foods, which will nourish your body and keep you healthy. The food you should eat also will depend on several factors, including:  The calories you  need.  The medicines you take.  Your weight.  Your blood glucose level.  Your blood pressure level.  Your cholesterol level. You should eat a variety of foods, including:  Protein.  Lean cuts of meat.  Proteins low in saturated fats, such as fish, egg whites, and beans. Avoid processed meats.  Fruits and vegetables.  Fruits and vegetables  that may help control blood glucose levels, such as apples, mangoes, and yams.  Dairy products.  Choose fat-free or low-fat dairy products, such as milk, yogurt, and cheese.  Grains, bread, pasta, and rice.  Choose whole grain products, such as multigrain bread, whole oats, and brown rice. These foods may help control blood pressure.  Fats.  Foods containing healthful fats, such as nuts, avocado, olive oil, canola oil, and fish. DOES EVERYONE WITH DIABETES MELLITUS HAVE THE SAME MEAL PLAN? Because every person with diabetes mellitus is different, there is not one meal plan that works for everyone. It is very important that you meet with a dietitian who will help you create a meal plan that is just right for you.   This information is not intended to replace advice given to you by your health care provider. Make sure you discuss any questions you have with your health care provider.   Document Released: 01/16/2005 Document Revised: 05/12/2014 Document Reviewed: 03/18/2013 Elsevier Interactive Patient Education 2016 [employer].  Diabetes and Exercise Exercising regularly is important. It is not just about losing weight. It has many health benefits, such as:  Improving your overall fitness, flexibility, and endurance.  Increasing your bone density.  Helping with weight control.  Decreasing your body fat.  Increasing your muscle strength.  Reducing stress and tension.  Improving your overall health. People with diabetes who exercise gain additional benefits because exercise:  Reduces appetite.  Improves the body's use of blood sugar (glucose).  Helps lower or control blood glucose.  Decreases blood pressure.  Helps control blood lipids (such as cholesterol and triglycerides).  Improves the body's use of the hormone insulin by:  Increasing the body's insulin sensitivity.  Reducing the body's insulin needs.  Decreases the risk for heart disease because  exercising:  Lowers cholesterol and triglycerides levels.  Increases the levels of good cholesterol (such as high-density lipoproteins [HDL]) in the body.  Lowers blood glucose levels. YOUR ACTIVITY PLAN  Choose an activity that you enjoy, and set realistic goals. To exercise safely, you should begin practicing any new physical activity slowly, and gradually increase the intensity of the exercise over time. Your health care provider or diabetes educator can help create an activity plan that works for you. General recommendations include:  Encouraging children to engage in at least 60 minutes of physical activity each day.  Stretching and performing strength training exercises, such as yoga or weight lifting, at least 2 times per week.  Performing a total of at least 150 minutes of moderate-intensity exercise each week, such as brisk walking or water aerobics.  Exercising at least 3 days per week, making sure you allow no more than 2 consecutive days to pass without exercising.  Avoiding long periods of inactivity (90 minutes or more). When you have to spend an extended period of time sitting down, take frequent breaks to walk or stretch. RECOMMENDATIONS FOR EXERCISING WITH TYPE 1 OR TYPE 2 DIABETES   Check your blood glucose before exercising. If blood glucose levels are greater than 240 mg/dL, check for urine ketones. Do not exercise if ketones are present.  Avoid  injecting insulin into areas of the body that are going to be exercised. For example, avoid injecting insulin into:  The arms when playing tennis.  The legs when jogging.  Keep a record of:  Food intake before and after you exercise.  Expected peak times of insulin action.  Blood glucose levels before and after you exercise.  The type and amount of exercise you have done.  Review your records with your health care provider. Your health care provider will help you to develop guidelines for adjusting food intake and  insulin amounts before and after exercising.  If you take insulin or oral hypoglycemic agents, watch for signs and symptoms of hypoglycemia. They include:  Dizziness.  Shaking.  Sweating.  Chills.  Confusion.  Drink plenty of water while you exercise to prevent dehydration or heat stroke. Body water is lost during exercise and must be replaced.  Talk to your health care provider before starting an exercise program to make sure it is safe for you. Remember, almost any type of activity is better than none.   This information is not intended to replace advice given to you by your health care provider. Make sure you discuss any questions you have with your health care provider.   Document Released: 07/12/2003 Document Revised: 09/05/2014 Document Reviewed: 09/28/2012 Elsevier Interactive Patient Education [employer].

## 2015-04-11 NOTE — Assessment & Plan Note (Addendum)
BP Readings from Last 3 Encounters:  04/11/15 132/74  01/10/15 154/82  01/02/15 156/73    Lab Results  Component Value Date   NA 141 12/28/2014   K 3.3* 12/28/2014   CREATININE 1.2* 12/28/2014    Assessment: Blood pressure control:  well controlled Progress toward BP goal:   at goal Comments: Compliant with therapy.  K 3.3 (Aug 2016) and she was started on K supplement (61mEq daily) by Heme/Onc.  She developed a rash which she feels was due to K supplement.  She stopped K and rash has resolved.    Plan: Medications:  continue current medications:  CONTINUE HCTZ 25mg  daily and lisinopril 40mg  daily Educational resources provided:   Self management tools provided:  BP log book Other plans: BMP today.  If K still low will try decreasing HCTZ to 12.5mg  daily.

## 2015-04-11 NOTE — Progress Notes (Signed)
Subjective:    Patient ID: [name], female    DOB: Apr 24, 1946, 69 y.o.   MRN: [id number]  HPI Comments: [name] is a 69 year old woman with PMH as below here for follow-up of HTN.  Please see problem based charting for status of this and other chronic conditions.    Past Medical History  Diagnosis Date  . Allergy   . Hypertension   . Hyperlipidemia   . Fatty liver disease, nonalcoholic   . History of hyperkalemia   . Obesity   . Renal lesion   . Hx antineoplastic chemo  10/12 - 06/24/11    PACLITAX EL COMPLETED 06/24/11  . Anemia   . Diabetes mellitus ORAL MED  . OA (osteoarthritis) of knee     RIGHT LEG  . Mild nonproliferative diabetic retinopathy of right eye 03/19/11    [name]  . Breast CA     (Rt) breast ca dx 4/12---  S/P RADICAL MASTECTOMY AND CHEMORADIATION  . Numbness and tingling     Hx: of in fingers and toes since chemotherapy   Current Outpatient Prescriptions on File Prior to Visit  Medication Sig Dispense Refill  . anastrozole (ARIMIDEX) 1 MG tablet Take 1 tablet (1 mg total) by mouth daily. 90 tablet 4  . atorvastatin (LIPITOR) 40 MG tablet Take 1 tablet (40 mg total) by mouth at bedtime. 30 tablet 1  . diclofenac sodium (VOLTAREN) 1 % GEL Apply 2 g topically 4 (four) times daily. 1 Tube 0  . diphenhydrAMINE-zinc acetate (BENADRYL EXTRA STRENGTH) cream Apply 1 application topically 3 (three) times daily as needed for itching. 28.4 g 0  . gabapentin (NEURONTIN) 100 MG capsule Take 1 capsule (100 mg total) by mouth 3 (three) times daily. 90 capsule 4  . glipiZIDE (GLUCOTROL) 5 MG tablet TAKE 1 TABLET(5 MG) BY MOUTH TWICE DAILY BEFORE A MEAL 60 tablet 3  . hydrochlorothiazide (HYDRODIURIL) 25 MG tablet Take 1 tablet (25 mg total) by mouth daily. 90 tablet 3  . hydrOXYzine (ATARAX/VISTARIL) 25 MG tablet Take 1 tablet (25 mg total) by mouth 3 (three) times daily as needed for itching. 21 tablet 0  . ibuprofen (ADVIL,MOTRIN) 600 MG tablet Take  1 tablet (600 mg total) by mouth every 8 (eight) hours as needed for moderate pain. 30 tablet 0  . lisinopril (PRINIVIL,ZESTRIL) 40 MG tablet TAKE 1 TABLET BY MOUTH DAILY 90 tablet 1  . metFORMIN (GLUCOPHAGE) 500 MG tablet TAKE 1 TABLET(500 MG) BY MOUTH TWICE DAILY WITH A MEAL 60 tablet 3  . Polyvinyl Alcohol-Povidone (REFRESH OP) Place 1 drop into both eyes daily as needed. Dry eyes     No current facility-administered medications on file prior to visit.    Review of Systems  Constitutional: Negative for fever, chills, appetite change and unexpected weight change.  Respiratory: Negative for cough, shortness of breath and wheezing.   Cardiovascular: Negative for chest pain, palpitations and leg swelling.  Gastrointestinal: Positive for diarrhea and constipation. Negative for nausea, vomiting, abdominal pain and blood in stool.       Occasional diarrhea or constipation she feels is related to certain foods.  Endocrine: Negative for polydipsia, polyphagia and polyuria.  Genitourinary: Negative for dysuria, vaginal bleeding, vaginal discharge, difficulty urinating and vaginal pain.  Neurological: Negative for syncope, weakness and light-headedness.  Psychiatric/Behavioral: Negative for dysphoric mood. The patient is not nervous/anxious.        Objective:   Physical Exam  Constitutional: [name],  place, and time. She appears well-developed. No distress.  HENT:  Head: Normocephalic and atraumatic.  Mouth/Throat: Oropharynx is clear and moist. No oropharyngeal exudate.  Eyes: EOM are normal. Pupils are equal, round, and reactive to light.  Neck: Neck supple.  Cardiovascular: Normal rate, regular rhythm and normal heart sounds.  Exam reveals no gallop and no friction rub.   No murmur heard. Pulmonary/Chest: Effort normal and breath sounds normal. No respiratory distress. She has no wheezes. She has no rales.  Abdominal: Soft. Bowel sounds are normal. She exhibits no  distension and no mass. There is no tenderness. There is no rebound and no guarding.  Musculoskeletal: Normal range of motion. She exhibits no edema or tenderness.  Neurological: She is alert and oriented to person, place, and time. No cranial nerve deficit.  Skin: Skin is warm. She is not diaphoretic.  Psychiatric: She has a normal mood and affect. Her behavior is normal. Thought content normal.  Vitals reviewed.         Assessment & Plan:  Please see problem based charting for A&P.

## 2015-04-12 LAB — BMP8+ANION GAP
Anion Gap: 20 mmol/L — ABNORMAL HIGH (ref 10.0–18.0)
BUN/Creatinine Ratio: 19 (ref 11–26)
BUN: 18 mg/dL (ref 8–27)
CO2: 25 mmol/L (ref 18–29)
Calcium: 10 mg/dL (ref 8.7–10.3)
Chloride: 97 mmol/L (ref 97–106)
Creatinine, Ser: 0.97 mg/dL (ref 0.57–1.00)
GFR calc Af Amer: 70 mL/min/{1.73_m2} (ref >59)
GFR calc non Af Amer: 61 mL/min/{1.73_m2} (ref >59)
Glucose: 102 mg/dL — ABNORMAL HIGH (ref 65–99)
Potassium: 3.9 mmol/L (ref 3.5–5.2)
Sodium: 142 mmol/L (ref 136–144)

## 2015-04-12 LAB — HEPATITIS C ANTIBODY: Hep C Virus Ab: 0.1 s/co ratio (ref 0.0–0.9)

## 2015-04-12 NOTE — Progress Notes (Signed)
Internal Medicine Clinic Attending  Case discussed with Dr. Wilson soon after the resident saw the patient.  We reviewed the resident's history and exam and pertinent patient test results.  I agree with the assessment, diagnosis, and plan of care documented in the resident's note.  

## 2015-06-05 ENCOUNTER — Telehealth: Payer: Self-pay | Admitting: *Deleted

## 2015-06-05 ENCOUNTER — Ambulatory Visit: Payer: Medicare Other | Admitting: *Deleted

## 2015-06-05 NOTE — Telephone Encounter (Signed)
Pt presents for flu shot, states she had a reaction a few years ago to flu shot and is not real sure about getting it, questioned more, she states she got it at church from the [place] the next day she felt sick and her skin was "crawly", spoke to dr Redmond Pulling and will not give flu shot today, dr Redmond Pulling will call pt this evening

## 2015-06-05 NOTE — Telephone Encounter (Signed)
Attempted to return call but no answer.  I left voicemail asking her to call clinic.

## 2015-06-28 ENCOUNTER — Other Ambulatory Visit: Payer: Self-pay | Admitting: Internal Medicine

## 2015-07-05 LAB — HM DIABETES EYE EXAM

## 2015-07-12 ENCOUNTER — Encounter: Payer: Self-pay | Admitting: Internal Medicine

## 2015-07-12 ENCOUNTER — Ambulatory Visit (INDEPENDENT_AMBULATORY_CARE_PROVIDER_SITE_OTHER): Payer: Medicare Other | Admitting: Internal Medicine

## 2015-07-12 VITALS — BP 109/88 | HR 97 | Temp 98.4°F | Ht 59.0 in | Wt 166.6 lb

## 2015-07-12 DIAGNOSIS — N289 Disorder of kidney and ureter, unspecified: Secondary | ICD-10-CM

## 2015-07-12 DIAGNOSIS — Z79899 Other long term (current) drug therapy: Secondary | ICD-10-CM | POA: Diagnosis not present

## 2015-07-12 DIAGNOSIS — M6740 Ganglion, unspecified site: Secondary | ICD-10-CM | POA: Insufficient documentation

## 2015-07-12 DIAGNOSIS — I10 Essential (primary) hypertension: Secondary | ICD-10-CM

## 2015-07-12 DIAGNOSIS — I152 Hypertension secondary to endocrine disorders: Secondary | ICD-10-CM | POA: Diagnosis not present

## 2015-07-12 DIAGNOSIS — E1159 Type 2 diabetes mellitus with other circulatory complications: Secondary | ICD-10-CM

## 2015-07-12 DIAGNOSIS — E1121 Type 2 diabetes mellitus with diabetic nephropathy: Secondary | ICD-10-CM

## 2015-07-12 DIAGNOSIS — R2231 Localized swelling, mass and lump, right upper limb: Secondary | ICD-10-CM | POA: Diagnosis not present

## 2015-07-12 DIAGNOSIS — E1129 Type 2 diabetes mellitus with other diabetic kidney complication: Secondary | ICD-10-CM

## 2015-07-12 LAB — POCT GLYCOSYLATED HEMOGLOBIN (HGB A1C): Hemoglobin A1C: 7.5

## 2015-07-12 LAB — GLUCOSE, CAPILLARY: Glucose-Capillary: 160 mg/dL — ABNORMAL HIGH (ref 65–99)

## 2015-07-12 NOTE — Progress Notes (Signed)
Medicine attending: Medical history, presenting problems, physical findings, and medications, reviewed with resident physician Dr Kyle Flores on the day of the patient visit and I concur with his evaluation and management plan. 

## 2015-07-12 NOTE — Assessment & Plan Note (Signed)
Her blood pressure remains well controlled at 110/80 today on HCTZ 25mg  daily and lisinopril 40mg  daily. She denies any side effects or orthostatic hypotension.

## 2015-07-12 NOTE — Assessment & Plan Note (Signed)
The skin-colored nodule on her dorsal wrist appears to be a ganglion cyst, less likely a giant cell tumor of the tendon sheath. She doesn't have any nerve compression symptoms. I told her 50% of these resolve spontaneously so she'd like to keep an eye on it. We can always aspirate it with a large 10g needle if it becomes bothersome but again it will likely recur regardless.

## 2015-07-12 NOTE — Patient Instructions (Addendum)
Ms. Mowdy,  The bump on your wrist is called a ganglion cyst. As we discussed, about 50% go away on their own but some need to be drained. I'm OK with keeping an eye on it.  For your diabetes, your a1c was 7.5 today, with our goal being below 7. Keep trying to cut out the pepsis and exercise as much as possible. We'll check it again in 3 months.'  I'm so sorry to hear about the loss of your sister. Please give [place] a call if you need any help or want to talk to counselor.  Take care, Dr. Melburn Hake

## 2015-07-12 NOTE — Progress Notes (Signed)
Patient ID: [name], female   DOB: Mar 17, 1946, 70 y.o.   MRN: [id number] [facility] Subjective:   Patient ID: [name] female   DOB: 02-03-1946 70 y.o.   MRN: [id number]  HPI: [name] is a 70 y.o. female with non-insulin-dependent type 2 diabetes with neuropathy, breast cancer status-post right modified mastectomy currently on anastrozole, and hypertension, presenting to clinic for follow-up of a bump on her wrist, type 2 diabetes and hypertension:  Dorsal wrist nodule: Since 04/09/2023, she had a firm bump on her dorsal wrist. She denies any paresthesias or pain, and it is not particularly bothering her but she would like to know what it is.  Type 2 diabetes: Her a1c 3 months ago was 7.4 on metformin 500mg  twice daily and glipizide 5mg  twice daily. Today her a1c is 7.5. She's been compliant with her medications but has been drinking 1 to 2 pepsis daily since her sister passed away in 04-09-2023 from an aneurysmal rupture.  Hypertension: She has been compliant with her medications and her pressure is 109/88 today. She denies any orthostatic hypotension or complaints of cough.  She is not a smoker and I have reviewed her medications with her today.  Please see the assessment and plan for the status of the patient's chronic medical problems.  Review of Systems  Constitutional: Negative for fever and chills.  Eyes: Negative for blurred vision.  Respiratory: Negative for cough and shortness of breath.   Cardiovascular: Negative for chest pain and leg swelling.  Skin: Negative for rash.  Neurological: Negative for dizziness.  Psychiatric/Behavioral: Positive for depression. The patient is not nervous/anxious.      Objective:  Physical Exam: Filed Vitals:   07/12/15 0856  BP: 109/88  Pulse: 97  Temp: 98.4 F (36.9 C)  TempSrc: Oral  Height: 4\' 11"  (1.499 m)  Weight: 166 lb 9.6 oz (75.569 kg)  SpO2: 100%   General: resting in chair  comfortably, appropriately conversational Cardiac: regular rate and rhythm, no rubs, murmurs or gallops Pulm: breathing well, clear to auscultation bilaterally Abd: bowel sounds normal, soft, nondistended, non-tender Ext: warm and well perfused, without pedal edema Lymph: no cervical or supraclavicular lymphadenopathy Skin: on the wright dorsal wrist, there is a firm immobile 1cm nodule that moves along the tendon when she makes a fist  Assessment & Plan:  Case discussed with Dr. Beryle Beams  Ganglion cyst The skin-colored nodule on her dorsal wrist appears to be a ganglion cyst, less likely a giant cell tumor of the tendon sheath. She doesn't have any nerve compression symptoms. I told her 50% of these resolve spontaneously so she'd like to keep an eye on it. We can always aspirate it with a large 10g needle if it becomes bothersome but again it will likely recur regardless.  Type 2 diabetes mellitus with renal manifestations Her a1c was 7.5 today, stable from 7.4 three months ago. Her diet has worsened since her sister passed away in 04-09-23 from a spontaneously ruptured brain aneurysm. She'd like to work on her diet and exercise and come see [place] in 3 months to check it again before adding another medication. If she continues be above her a1c goal of less than 7, I think an SGLT-2 inhibitor would be a nice option for her for the added benefit of weight loss.  Hypertension associated with diabetes Her blood pressure remains well controlled at 110/80 today on HCTZ 25mg  daily and lisinopril 40mg  daily. She denies any side effects or orthostatic  hypotension.   Other Orders Orders Placed This Encounter  Procedures  . Glucose, capillary  . POC Hbg A1C   Follow Up: Return in about 3 months (around 10/12/2015).

## 2015-07-12 NOTE — Assessment & Plan Note (Signed)
Her a1c was 7.5 today, stable from 7.4 three months ago. Her diet has worsened since her sister passed away in April 15, 2023 from a spontaneously ruptured brain aneurysm. She'd like to work on her diet and exercise and come see [place] in 3 months to check it again before adding another medication. If she continues be above her a1c goal of less than 7, I think an SGLT-2 inhibitor would be a nice option for her for the added benefit of weight loss.

## 2015-08-01 ENCOUNTER — Other Ambulatory Visit: Payer: Self-pay | Admitting: *Deleted

## 2015-08-01 ENCOUNTER — Other Ambulatory Visit: Payer: Self-pay | Admitting: Internal Medicine

## 2015-08-01 NOTE — Telephone Encounter (Signed)
[name] from [facility] requesting the nurse to call back.

## 2015-08-01 NOTE — Telephone Encounter (Signed)
Called back, pt needs refill

## 2015-08-02 MED ORDER — GLIPIZIDE 5 MG PO TABS: 5.0000 mg | ORAL_TABLET | Freq: Two times a day (BID) | ORAL | Status: DC

## 2015-08-12 ENCOUNTER — Other Ambulatory Visit: Payer: Self-pay | Admitting: Oncology

## 2015-08-13 ENCOUNTER — Other Ambulatory Visit: Payer: Self-pay | Admitting: Oncology

## 2015-10-02 ENCOUNTER — Other Ambulatory Visit: Payer: Self-pay | Admitting: Oncology

## 2015-10-10 ENCOUNTER — Encounter: Payer: Self-pay | Admitting: Internal Medicine

## 2015-10-10 ENCOUNTER — Ambulatory Visit (INDEPENDENT_AMBULATORY_CARE_PROVIDER_SITE_OTHER): Payer: Medicare Other | Admitting: Internal Medicine

## 2015-10-10 VITALS — BP 118/66 | HR 99 | Temp 98.0°F | Ht 59.0 in | Wt 166.8 lb

## 2015-10-10 DIAGNOSIS — Z853 Personal history of malignant neoplasm of breast: Secondary | ICD-10-CM | POA: Diagnosis not present

## 2015-10-10 DIAGNOSIS — N289 Disorder of kidney and ureter, unspecified: Secondary | ICD-10-CM

## 2015-10-10 DIAGNOSIS — E1121 Type 2 diabetes mellitus with diabetic nephropathy: Secondary | ICD-10-CM

## 2015-10-10 DIAGNOSIS — E785 Hyperlipidemia, unspecified: Secondary | ICD-10-CM | POA: Diagnosis not present

## 2015-10-10 DIAGNOSIS — N898 Other specified noninflammatory disorders of vagina: Secondary | ICD-10-CM | POA: Diagnosis not present

## 2015-10-10 DIAGNOSIS — Z90710 Acquired absence of both cervix and uterus: Secondary | ICD-10-CM

## 2015-10-10 DIAGNOSIS — E1129 Type 2 diabetes mellitus with other diabetic kidney complication: Secondary | ICD-10-CM | POA: Diagnosis not present

## 2015-10-10 DIAGNOSIS — E1159 Type 2 diabetes mellitus with other circulatory complications: Secondary | ICD-10-CM | POA: Diagnosis not present

## 2015-10-10 DIAGNOSIS — Z79899 Other long term (current) drug therapy: Secondary | ICD-10-CM

## 2015-10-10 DIAGNOSIS — I152 Hypertension secondary to endocrine disorders: Secondary | ICD-10-CM

## 2015-10-10 DIAGNOSIS — Z794 Long term (current) use of insulin: Secondary | ICD-10-CM

## 2015-10-10 DIAGNOSIS — I10 Essential (primary) hypertension: Secondary | ICD-10-CM

## 2015-10-10 DIAGNOSIS — N939 Abnormal uterine and vaginal bleeding, unspecified: Secondary | ICD-10-CM | POA: Diagnosis not present

## 2015-10-10 LAB — GLUCOSE, CAPILLARY: Glucose-Capillary: 106 mg/dL — ABNORMAL HIGH (ref 65–99)

## 2015-10-10 LAB — POCT GLYCOSYLATED HEMOGLOBIN (HGB A1C): Hemoglobin A1C: 7.3

## 2015-10-10 NOTE — Assessment & Plan Note (Addendum)
BP Readings from Last 3 Encounters:  10/10/15 118/66  07/12/15 109/88  04/11/15 132/74    Lab Results  Component Value Date   NA 142 04/11/2015   K 3.9 04/11/2015   CREATININE 0.97 04/11/2015    Assessment: Blood pressure control:  well controlled Progress toward BP goal:   at goal Comments: Compliant with HCTZ 25mg  daily and lisinopril 40mg  daily.  Occasionally, eating fried/salty foods.  She has a [employer] but has not yet gone.   Plan: Medications:  continue current medications:  HCTZ 25mg  daily and lisinopril 40mg  daily Educational resources provided:   Self management tools provided:   Other plans: CMP today.  Advised increase activity. RTC in 3 months.

## 2015-10-10 NOTE — Assessment & Plan Note (Addendum)
Lab Results  Component Value Date   HGBA1C 7.3 10/10/2015   HGBA1C 7.5 07/12/2015   HGBA1C 7.4 04/11/2015     Assessment: Diabetes control:  fair control Progress toward A1C goal:   near goal Comments: She brought her meter but we are unable to download this particular meter. She reports fasting CBGs of 140-160s.  No lows.  Drinking soda (Pepsi) and juice (had a Naked juice with her breakfast).  Compliant with meds.   Plan: Medications:  continue current medications:  Glipizide 5mg  BID and metformin 500mg  BID Home glucose monitoring:  yes Frequency:  BID Timing:  fasting AM and evening Instruction/counseling given: reminded to bring blood glucose meter & log to each visit and discussed diet Other plans: She reports yearly eye exams but we do not have a recent one - will send for records.  Educated her on the high sugar content of juices (particularly Naked Juice) and advised eating fruit would be a better option or switching to low calorie or diet juice as last resort.  Advised d/c soda.  RTC in 3 months for follow-up.

## 2015-10-10 NOTE — Patient Instructions (Signed)
1. I will call you regarding your lab results.  You can try over the counter lubricants.  I will look for old records and let you know if you need to come in for any additional testing.   2. Please take all medications as prescribed.    3. If you have worsening of your symptoms or new symptoms arise, please call the clinic [phone] or go to the ER immediately if symptoms are severe.   Please return in 3 months for follow-up.

## 2015-10-10 NOTE — Progress Notes (Signed)
Subjective:    Patient ID: [name], female    DOB: 1945/12/05, 70 y.o.   MRN: [id number]  HPI Comments: [name] is a 70 year old woman with PMH as below here for follow-up of her HTN.  Please see problem based charting for the status of this and other chronic conditions.     Past Medical History  Diagnosis Date  . Allergy   . Hypertension   . Hyperlipidemia   . Fatty liver disease, nonalcoholic   . History of hyperkalemia   . Obesity   . Renal lesion   . Hx antineoplastic chemo  10/12 - 06/24/11    PACLITAX EL COMPLETED 06/24/11  . Anemia   . Diabetes mellitus ORAL MED  . OA (osteoarthritis) of knee     RIGHT LEG  . Mild nonproliferative diabetic retinopathy of right eye ([place]) 03/19/11    Dr. Joseph Art  . Breast CA [facility])     (Rt) breast ca dx 4/12---  S/P RADICAL MASTECTOMY AND CHEMORADIATION  . Numbness and tingling     Hx: of in fingers and toes since chemotherapy   Current Outpatient Prescriptions on File Prior to Visit  Medication Sig Dispense Refill  . anastrozole (ARIMIDEX) 1 MG tablet Take 1 tablet (1 mg total) by mouth daily. 90 tablet 4  . atorvastatin (LIPITOR) 40 MG tablet Take 1 tablet (40 mg total) by mouth at bedtime. 90 tablet 3  . gabapentin (NEURONTIN) 100 MG capsule TAKE ONE CAPSULE BY MOUTH THREE TIMES DAILY 270 capsule 0  . gabapentin (NEURONTIN) 100 MG capsule TAKE ONE CAPSULE BY MOUTH THREE TIMES DAILY 90 capsule 0  . glipiZIDE (GLUCOTROL) 5 MG tablet Take 1 tablet (5 mg total) by mouth 2 (two) times daily before a meal. 180 tablet 0  . hydrochlorothiazide (HYDRODIURIL) 25 MG tablet Take 1 tablet (25 mg total) by mouth daily. 90 tablet 3  . lisinopril (PRINIVIL,ZESTRIL) 40 MG tablet TAKE 1 TABLET BY MOUTH DAILY 90 tablet 1  . metFORMIN (GLUCOPHAGE) 500 MG tablet TAKE 1 TABLET(500 MG) BY MOUTH TWICE DAILY WITH A MEAL 180 tablet 3  . Polyvinyl Alcohol-Povidone (REFRESH OP) Place 1 drop into both eyes daily as needed. Dry eyes     No  current facility-administered medications on file prior to visit.    Review of Systems  Constitutional: Negative for fever, chills, appetite change and unexpected weight change.  Eyes: Negative for visual disturbance.  Respiratory: Negative for cough and shortness of breath.   Cardiovascular: Negative for chest pain, palpitations and leg swelling.  Gastrointestinal: Negative for nausea, vomiting, abdominal pain, diarrhea, constipation and blood in stool.  Genitourinary: Positive for vaginal discharge and dyspareunia.  Psychiatric/Behavioral: Negative for dysphoric mood.       Objective:   Physical Exam  Constitutional: [name], place, and time. She appears well-developed. No distress.  HENT:  Head: Normocephalic and atraumatic.  Mouth/Throat: Oropharynx is clear and moist. No oropharyngeal exudate.  Eyes: Conjunctivae and EOM are normal. Pupils are equal, round, and reactive to light. No scleral icterus.  Neck: Neck supple.  Cardiovascular: Normal rate, regular rhythm and normal heart sounds.  Exam reveals no gallop and no friction rub.   No murmur heard. Pulmonary/Chest: Effort normal and breath sounds normal. No respiratory distress. She has no wheezes. She has no rales.  Abdominal: Soft. Bowel sounds are normal. She exhibits no distension. There is no tenderness. There is no rebound and no guarding.  Musculoskeletal: Normal range  of motion. She exhibits no edema or tenderness.  Neurological: She is alert and oriented to person, place, and time. No cranial nerve deficit.  Skin: Skin is warm and dry. She is not diaphoretic.  Psychiatric: She has a normal mood and affect. Her behavior is normal. Judgment and thought content normal.  Vitals reviewed.         Assessment & Plan:  Please see problem based charting for A&P.

## 2015-10-11 LAB — CMP14 + ANION GAP
ALT: 26 IU/L (ref 0–32)
AST: 17 IU/L (ref 0–40)
Albumin/Globulin Ratio: 2.1 (ref 1.2–2.2)
Albumin: 4.7 g/dL (ref 3.6–4.8)
Alkaline Phosphatase: 65 IU/L (ref 39–117)
Anion Gap: 17 mmol/L (ref 10.0–18.0)
BUN/Creatinine Ratio: 19 (ref 12–28)
BUN: 17 mg/dL (ref 8–27)
Bilirubin Total: 0.3 mg/dL (ref 0.0–1.2)
CO2: 26 mmol/L (ref 18–29)
Calcium: 10.1 mg/dL (ref 8.7–10.3)
Chloride: 97 mmol/L (ref 96–106)
Creatinine, Ser: 0.91 mg/dL (ref 0.57–1.00)
GFR calc Af Amer: 75 mL/min/{1.73_m2} (ref >59)
GFR calc non Af Amer: 65 mL/min/{1.73_m2} (ref >59)
Globulin, Total: 2.2 g/dL (ref 1.5–4.5)
Glucose: 93 mg/dL (ref 65–99)
Potassium: 3.7 mmol/L (ref 3.5–5.2)
Sodium: 140 mmol/L (ref 134–144)
Total Protein: 6.9 g/dL (ref 6.0–8.5)

## 2015-10-11 LAB — CBC
Hematocrit: 32.8 % — ABNORMAL LOW (ref 34.0–46.6)
Hemoglobin: 10.7 g/dL — ABNORMAL LOW (ref 11.1–15.9)
MCH: 29.9 pg (ref 26.6–33.0)
MCHC: 32.6 g/dL (ref 31.5–35.7)
MCV: 92 fL (ref 79–97)
Platelets: 239 10*3/uL (ref 150–379)
RBC: 3.58 x10E6/uL — ABNORMAL LOW (ref 3.77–5.28)
RDW: 16.9 % — ABNORMAL HIGH (ref 12.3–15.4)
WBC: 5.5 10*3/uL (ref 3.4–10.8)

## 2015-10-12 MED ORDER — GLIPIZIDE 5 MG PO TABS
5.0000 mg | ORAL_TABLET | Freq: Two times a day (BID) | ORAL | Status: DC
Start: 2015-10-12 — End: 2016-05-02

## 2015-10-12 NOTE — Progress Notes (Signed)
Internal Medicine Clinic Attending  Case discussed with Dr. Wilson soon after the resident saw the patient.  We reviewed the resident's history and exam and pertinent patient test results.  I agree with the assessment, diagnosis, and plan of care documented in the resident's note.  

## 2015-10-12 NOTE — Assessment & Plan Note (Signed)
Assessment:  During ROS she admitted to vaginal dryness, daily discharge (dark color) and dyspareunia.  Going on for years.  She is had hysterectomy (she thinks cervix was taken too) and unilateral oopherectomy many years ago.  The other ovary and fallopian tube were removed around the time of her breast cancer diagnosis.  I suspect her symptoms are due to vaginal atrophy in the setting of postmenopausal status and Arimidex use.   Plan:  Advised trying different lubricants (she has tried [place]).  Would not use vaginal estrogen in this patient due to breast cancer and AI use.  Will check chart to make sure she does not have cervix (would need PAP).

## 2015-10-12 NOTE — Assessment & Plan Note (Addendum)
Assessment:  Compliant with Lipitor 40mg  daily.  No ADRs.   Plan:  She is not fasting, plan FLP next visit.  Continue Lipitor 40mg  daily for now.  Increase activity.

## 2015-10-17 ENCOUNTER — Encounter: Payer: Self-pay | Admitting: *Deleted

## 2015-11-14 ENCOUNTER — Ambulatory Visit (INDEPENDENT_AMBULATORY_CARE_PROVIDER_SITE_OTHER): Payer: Medicare Other | Admitting: Internal Medicine

## 2015-11-14 ENCOUNTER — Other Ambulatory Visit: Payer: Self-pay | Admitting: Internal Medicine

## 2015-11-14 VITALS — BP 132/79 | HR 83 | Temp 98.4°F | Wt 165.1 lb

## 2015-11-14 DIAGNOSIS — T783XXA Angioneurotic edema, initial encounter: Secondary | ICD-10-CM

## 2015-11-14 DIAGNOSIS — T63461A Toxic effect of venom of wasps, accidental (unintentional), initial encounter: Secondary | ICD-10-CM | POA: Diagnosis present

## 2015-11-14 MED ORDER — RANITIDINE HCL 150 MG PO TABS: 150.0000 mg | ORAL_TABLET | Freq: Two times a day (BID) | ORAL | Status: DC

## 2015-11-14 MED ORDER — METHYLPREDNISOLONE ACETATE 80 MG/ML IJ SUSP
60.0000 mg | Freq: Once | INTRAMUSCULAR | Status: AC
  Administered 2015-11-14: 60 mg via INTRAMUSCULAR

## 2015-11-14 NOTE — Assessment & Plan Note (Signed)
Reports wasp sting the the right occiput ~18 hours prior to presentation. Shortly after the incident she noted tenderness locally and the development of urticaria and "welts on the UE and neck. She began benadryl with some resolution in itching, but then noticed swelling in her upper lip. Swelling has worsened overnight and this morning. Denies any SOB, wheezing, lightheadedness, or hypotention. She does endorse some HA, but denies any swelling of the tongue or mouth.  Likely Type 1 hypersensitivity reaction to acute insect antigen. Will continue benedryl, add ranitidine until resolution of symptoms and provide depo-methylpred in the office today. Counseled on anaphylaxis precautions and potential for increased BG following steroid injection. F/u PRN.

## 2015-11-14 NOTE — Progress Notes (Signed)
CC: wasp sting HPI: [name] is a 70 y.o. female with a h/o of DM, HTN who presents with acute wasp sting.  See problem-based charting for HPI.  Past Medical History  Diagnosis Date  . Allergy   . Hypertension   . Hyperlipidemia   . Fatty liver disease, nonalcoholic   . History of hyperkalemia   . Obesity   . Renal lesion   . Hx antineoplastic chemo  10/12 - 06/24/11    PACLITAX EL COMPLETED 06/24/11  . Anemia   . Diabetes mellitus ORAL MED  . OA (osteoarthritis) of knee     RIGHT LEG  . Mild nonproliferative diabetic retinopathy of right eye ([place]) 03/19/11    Dr. Joseph Art  . Breast CA [facility])     (Rt) breast ca dx 4/12---  S/P RADICAL MASTECTOMY AND CHEMORADIATION  . Numbness and tingling     Hx: of in fingers and toes since chemotherapy   Current Outpatient Rx  Name  Route  Sig  Dispense  Refill  . anastrozole (ARIMIDEX) 1 MG tablet   Oral   Take 1 tablet (1 mg total) by mouth daily.   90 tablet   4   . atorvastatin (LIPITOR) 40 MG tablet   Oral   Take 1 tablet (40 mg total) by mouth at bedtime.   90 tablet   3   . gabapentin (NEURONTIN) 100 MG capsule      TAKE ONE CAPSULE BY MOUTH THREE TIMES DAILY   270 capsule   0     **Patient requests 90 days supply**   . glipiZIDE (GLUCOTROL) 5 MG tablet   Oral   Take 1 tablet (5 mg total) by mouth 2 (two) times daily before a meal.   180 tablet   1   . hydrochlorothiazide (HYDRODIURIL) 25 MG tablet   Oral   Take 1 tablet (25 mg total) by mouth daily.   90 tablet   3   . lisinopril (PRINIVIL,ZESTRIL) 40 MG tablet      TAKE 1 TABLET BY MOUTH DAILY   90 tablet   1   . metFORMIN (GLUCOPHAGE) 500 MG tablet      TAKE 1 TABLET(500 MG) BY MOUTH TWICE DAILY WITH A MEAL   180 tablet   3   . Polyvinyl Alcohol-Povidone (REFRESH OP)   Both Eyes   Place 1 drop into both eyes daily as needed. Dry eyes         . ranitidine (ZANTAC) 150 MG tablet   Oral   Take 1 tablet (150 mg total) by mouth  2 (two) times daily.   60 tablet   1    Review of Systems: A complete ROS was negative except as per HPI.  Physical Exam: Filed Vitals:   11/14/15 0932  BP: 132/79  Pulse: 83  Temp: 98.4 F (36.9 C)  TempSrc: Oral  Weight: 165 lb 1.6 oz (74.889 kg)  SpO2: 100%   General appearance: alert, cooperative and appears stated age Head: Normocephalic, without obvious abnormality, atraumatic, no TTP or lesion apparent overlying purported insect sting site on the right occiput Eyes: conjunctivae/corneas clear. PERRL, EOM's intact. Throat: mucosa, and tongue normal; teeth and gums normal and moderate angioedema of the upperlip Lungs: clear to auscultation bilaterally Heart: regular rate and rhythm, S1, S2 normal, no murmur, click, rub or gallop Abdomen: soft, non-tender; bowel sounds normal; no masses,  no organomegaly Extremities: extremities normal, atraumatic, no cyanosis or edema  Assessment &  Plan:  See encounters tab for problem based medical decision making. Patient seen with Dr. Angelia Mould  Signed: Holley Raring, MD 11/14/2015, 11:44 AM  Pager: [phone]

## 2015-11-14 NOTE — Progress Notes (Signed)
Internal Medicine Clinic Attending  I saw and evaluated the patient.  I personally confirmed the key portions of the history and exam documented by Dr. Strelow and I reviewed pertinent patient test results.  The assessment, diagnosis, and plan were formulated together and I agree with the documentation in the resident's note. 

## 2015-11-14 NOTE — Patient Instructions (Addendum)
Continue to take Benedryl as needed for itching and swelling every 4-6 hours. Also take Ranitidine (Zantac) 150 mg 2 times a day until complete resolution of symptoms.  We have given your a steroid shot today to help resolve your swelling and other symptoms. This may raise your blood sugars, so please check them over the next few days. If your blood sugar gets too high on the meter, or you start to experience symptoms of high blood sugar such as increased thirst, urination, shakes/tremors, or fatigue please call our clinic for more instructions.  If you notice shortness of breath or difficulty breathing, racing heart, or lightheadedness, please go to the Emergency Department as this may be a sign of severe allergic reaction.

## 2015-11-15 NOTE — Telephone Encounter (Signed)
Patient requesting a 90 day supply.  Thanks

## 2015-11-20 ENCOUNTER — Encounter (HOSPITAL_COMMUNITY): Payer: Self-pay

## 2015-11-20 ENCOUNTER — Emergency Department (HOSPITAL_COMMUNITY)
Admission: EM | Admit: 2015-11-20 | Discharge: 2015-11-21 | Disposition: A | Discharge status: Home / Self Care | Payer: Medicare Other | Attending: Emergency Medicine | Admitting: Emergency Medicine

## 2015-11-20 ENCOUNTER — Other Ambulatory Visit: Payer: Self-pay | Admitting: Oncology

## 2015-11-20 DIAGNOSIS — Z7984 Long term (current) use of oral hypoglycemic drugs: Secondary | ICD-10-CM | POA: Insufficient documentation

## 2015-11-20 DIAGNOSIS — Z96651 Presence of right artificial knee joint: Secondary | ICD-10-CM | POA: Diagnosis not present

## 2015-11-20 DIAGNOSIS — R05 Cough: Secondary | ICD-10-CM | POA: Diagnosis not present

## 2015-11-20 DIAGNOSIS — Y929 Unspecified place or not applicable: Secondary | ICD-10-CM | POA: Diagnosis not present

## 2015-11-20 DIAGNOSIS — I10 Essential (primary) hypertension: Secondary | ICD-10-CM | POA: Diagnosis not present

## 2015-11-20 DIAGNOSIS — Z853 Personal history of malignant neoplasm of breast: Secondary | ICD-10-CM | POA: Diagnosis not present

## 2015-11-20 DIAGNOSIS — N3000 Acute cystitis without hematuria: Secondary | ICD-10-CM | POA: Insufficient documentation

## 2015-11-20 DIAGNOSIS — E083291 Diabetes mellitus due to underlying condition with mild nonproliferative diabetic retinopathy without macular edema, right eye: Secondary | ICD-10-CM | POA: Insufficient documentation

## 2015-11-20 DIAGNOSIS — T63461A Toxic effect of venom of wasps, accidental (unintentional), initial encounter: Secondary | ICD-10-CM | POA: Diagnosis not present

## 2015-11-20 DIAGNOSIS — Y999 Unspecified external cause status: Secondary | ICD-10-CM | POA: Insufficient documentation

## 2015-11-20 DIAGNOSIS — Z79899 Other long term (current) drug therapy: Secondary | ICD-10-CM | POA: Diagnosis not present

## 2015-11-20 DIAGNOSIS — Z1231 Encounter for screening mammogram for malignant neoplasm of breast: Secondary | ICD-10-CM

## 2015-11-20 DIAGNOSIS — X58XXXA Exposure to other specified factors, initial encounter: Secondary | ICD-10-CM | POA: Insufficient documentation

## 2015-11-20 DIAGNOSIS — Y939 Activity, unspecified: Secondary | ICD-10-CM | POA: Diagnosis not present

## 2015-11-20 MED ORDER — SODIUM CHLORIDE 0.9 % IV SOLN: 1000.0000 mL | INTRAVENOUS | Status: DC

## 2015-11-20 MED ORDER — ONDANSETRON HCL 4 MG/2ML IJ SOLN
4.0000 mg | Freq: Once | INTRAMUSCULAR | Status: AC
  Administered 2015-11-21: 4 mg via INTRAVENOUS
  Filled 2015-11-20: qty 2

## 2015-11-20 MED ORDER — SODIUM CHLORIDE 0.9 % IV SOLN
1000.0000 mL | Freq: Once | INTRAVENOUS | Status: AC
  Administered 2015-11-21: 1000 mL via INTRAVENOUS

## 2015-11-20 NOTE — ED Provider Notes (Signed)
CSN: [id number]     Arrival date & time 11/20/15  2020 History  By signing my name below, I, Georgette Shell, attest that this documentation has been prepared under the direction and in the presence of [name] Schmidt, MD. Electronically Signed: Georgette Shell, ED Scribe. 11/21/2015. 12:47 AM.   Chief Complaint  Patient presents with  . Insect Bite   The history is provided by the patient. No language interpreter was used.   HPI Comments: [name] is a 70 y.o. female with h/o DM and HTN who presents to the Emergency Department complaining of generalized weakness and nausea onset one week ago. Pt also has associated chills, headache, decreased appetite, abdominal pain, cough, and intermittent diarrhea. Pt also has had dysuria beginning a couple days ago. Pt was stung by a wasp and was treated with a steroid shot. Pt notes that she feels like her symptoms are associated with her wasp bite. Pt reports she still feels the same and her symptoms have not improved. Pt denies fever.  Past Medical History  Diagnosis Date  . Allergy   . Hypertension   . Hyperlipidemia   . Fatty liver disease, nonalcoholic   . History of hyperkalemia   . Obesity   . Renal lesion   . Hx antineoplastic chemo  10/12 - 06/24/11    PACLITAX EL COMPLETED 06/24/11  . Anemia   . Diabetes mellitus ORAL MED  . OA (osteoarthritis) of knee     RIGHT LEG  . Mild nonproliferative diabetic retinopathy of right eye ([place]) 03/19/11    Dr. Joseph Art  . Breast CA [facility])     (Rt) breast ca dx 4/12---  S/P RADICAL MASTECTOMY AND CHEMORADIATION  . Numbness and tingling     Hx: of in fingers and toes since chemotherapy   Past Surgical History  Procedure Laterality Date  . Knee arthroscopy  10-28-1999    RIGHT  . Mastectomy modified radical  12-04-2010    W/ LEFT PAC PLACEMENT (RIGHT BREAST W/ AXILLARY CONTENTS/ NODE BX'S  . Transthoracic echocardiogram  10-29-2010    NORMAL LVF/ EF 55-60%/ MILD MV REGURG  . Vaginal hysterectomy   AGE 89    W/ LSO  . Tubal ligation  YRS AGO  . Laparoscopy with laparoscopic right salpingo oophorectomy and lysis of pelvic and abdominal adhesions  March 2013    Dr. Nori Riis   . Portacath placement      REPLACED [facility] DUE TO MALFUNCTION (LEFT)  . Port-a-cath removal  09/24/2011    Procedure: REMOVAL PORT-A-CATH;  Surgeon: Joyice Faster. Cornett, MD;  Location: [facility];  Service: General;  Laterality: N/A;  . Colonoscopy w/ polypectomy      Hx; of  . Total knee arthroplasty Right 08/16/2012    Procedure: TOTAL KNEE ARTHROPLASTY;  Surgeon: Kerin Salen, MD;  Location: [place];  Service: Orthopedics;  Laterality: Right;  . I&d knee with poly exchange Right 09/03/2012    Procedure: IRRIGATION AND DEBRIDEMENT RIGHT KNEE WITH POLY EXCHANGE;  Surgeon: Kerin Salen, MD;  Location: [place];  Service: Orthopedics;  Laterality: Right;   Family History  Problem Relation Age of Onset  . Stroke Father   . Heart disease Sister   . Cancer Sister     breast  . Heart disease Brother   . Cancer Sister     COLON CANCER   Social History  Substance Use Topics  . Smoking status: Never Smoker   . Smokeless tobacco: Never Used  . Alcohol  Use: No   OB History    Gravida Para Term Preterm AB TAB SAB Ectopic Multiple Living   4 4 4       4      Review of Systems A complete 10 system review of systems was obtained and all systems are negative except as noted in the HPI and PMH.    Allergies  Metformin  Home Medications   Prior to Admission medications   Medication Sig Start Date End Date Taking? Authorizing Provider  anastrozole (ARIMIDEX) 1 MG tablet Take 1 tablet (1 mg total) by mouth daily. 01/02/15  Yes Chauncey Cruel, MD  atorvastatin (LIPITOR) 40 MG tablet Take 1 tablet (40 mg total) by mouth at bedtime. 04/11/15  Yes Alex Ronnie Derby, DO  gabapentin (NEURONTIN) 100 MG capsule TAKE ONE CAPSULE BY MOUTH THREE TIMES DAILY 08/13/15  Yes Chauncey Cruel, MD  glipiZIDE (GLUCOTROL) 5 MG tablet Take 1 tablet (5  mg total) by mouth 2 (two) times daily before a meal. 10/12/15  Yes Francesca Oman, DO  hydrochlorothiazide (HYDRODIURIL) 25 MG tablet Take 1 tablet (25 mg total) by mouth daily. 04/11/15  Yes Alex Ronnie Derby, DO  lisinopril (PRINIVIL,ZESTRIL) 40 MG tablet TAKE 1 TABLET BY MOUTH DAILY 06/29/15  Yes Francesca Oman, DO  metFORMIN (GLUCOPHAGE) 500 MG tablet TAKE 1 TABLET(500 MG) BY MOUTH TWICE DAILY WITH A MEAL 04/11/15  Yes Francesca Oman, DO  ranitidine (ZANTAC) 150 MG tablet TAKE 1 TABLET(150 MG) BY MOUTH TWICE DAILY 11/15/15  Yes Holley Raring, MD   BP 143/77 mmHg  Pulse 76  Temp(Src) 98.3 F (36.8 C) (Oral)  Resp 16  Wt 161 lb 11.2 oz (73.347 kg)  SpO2 99% Physical Exam  Constitutional: [name], place, and time. She appears well-developed and well-nourished. No distress.  HENT:  Head: Normocephalic and atraumatic.  Eyes: EOM are normal.  Neck: Normal range of motion.  Cardiovascular: Normal rate, regular rhythm and normal heart sounds.   Pulmonary/Chest: Effort normal and breath sounds normal.  Abdominal: Soft. She exhibits no distension. There is no tenderness.  Musculoskeletal: Normal range of motion.  Neurological: She is alert and oriented to person, place, and time.  Skin: Skin is warm and dry.  Psychiatric: She has a normal mood and affect. Judgment normal.  Nursing note and vitals reviewed.   ED Course  Procedures  DIAGNOSTIC STUDIES: Oxygen Saturation is 99% on RA, normal by my interpretation.    COORDINATION OF CARE: 11:53 PM Discussed treatment plan with pt at bedside which includes CXR, IV fluids, and urinalysis and pt agreed to plan.  Labs Review Labs Reviewed  CBC - Abnormal; Notable for the following:    RBC 3.77 (*)    Hemoglobin 11.3 (*)    HCT 34.9 (*)    RDW 16.8 (*)    All other components within normal limits  COMPREHENSIVE METABOLIC PANEL - Abnormal; Notable for the following:    Potassium 3.4 (*)    Creatinine, Ser 1.05 (*)    GFR calc  non Af Amer 53 (*)    All other components within normal limits  URINALYSIS, ROUTINE W REFLEX MICROSCOPIC (NOT AT [facility]) - Abnormal; Notable for the following:    APPearance CLOUDY (*)    Leukocytes, UA LARGE (*)    All other components within normal limits  URINE MICROSCOPIC-ADD ON - Abnormal; Notable for the following:    Squamous Epithelial / LPF 0-5 (*)    Bacteria, UA  MANY (*)    All other components within normal limits  CBG MONITORING, ED    Imaging Review Dg Chest 2 View  11/21/2015  CLINICAL DATA:  Cough, chills, weakness EXAM: CHEST  2 VIEW COMPARISON:  09/04/2012 FINDINGS: Top normal heart size. Stable aortic tortuosity. There is no edema, consolidation, effusion, or pneumothorax. Postoperative right breast and axilla. Bulky spondylosis and shoulder osteoarthritis. IMPRESSION: No acute finding. Electronically Signed   By: Monte Fantasia M.D.   On: 11/21/2015 01:05   I have personally reviewed and evaluated these images and lab results as part of my medical decision-making.   EKG Interpretation None      MDM   Final diagnoses:  Acute cystitis without hematuria    Patient is overall well-appearing.  Her vital signs are normal.  Her chest x-ray or labs without significant abnormality except for urinalysis which appears to demonstrate urinary tract infection.  This would be consistent with her new dysuria is likely the cause of her symptoms.  Primary care follow-up.  She understands to return to the ER for new or worsening symptoms.  Home with Keflex.  Urine culture sent.  I personally performed the services described in this documentation, which was scribed in my presence. The recorded information has been reviewed and is accurate.       [name] Schmidt, MD 11/21/15 [phone]

## 2015-11-20 NOTE — ED Notes (Signed)
Pt states last Tuesday  She was stung by a wasp ans was given steroid shot; Pt states she still feels bad and not improving; Pt states she has continuous  chills, HA and has diarrhea for a shot period; pt states she has decrease in appetite; No fever at triage

## 2015-11-21 ENCOUNTER — Emergency Department (HOSPITAL_COMMUNITY): Payer: Medicare Other

## 2015-11-21 DIAGNOSIS — R05 Cough: Secondary | ICD-10-CM | POA: Diagnosis not present

## 2015-11-21 DIAGNOSIS — T63461A Toxic effect of venom of wasps, accidental (unintentional), initial encounter: Secondary | ICD-10-CM | POA: Diagnosis not present

## 2015-11-21 LAB — URINALYSIS, ROUTINE W REFLEX MICROSCOPIC
Bilirubin Urine: NEGATIVE
Glucose, UA: NEGATIVE mg/dL
Hgb urine dipstick: NEGATIVE
Ketones, ur: NEGATIVE mg/dL
Nitrite: NEGATIVE
Protein, ur: NEGATIVE mg/dL
Specific Gravity, Urine: 1.015 (ref 1.005–1.030)
pH: 6 (ref 5.0–8.0)

## 2015-11-21 LAB — CBC
HCT: 34.9 % — ABNORMAL LOW (ref 36.0–46.0)
Hemoglobin: 11.3 g/dL — ABNORMAL LOW (ref 12.0–15.0)
MCH: 30 pg (ref 26.0–34.0)
MCHC: 32.4 g/dL (ref 30.0–36.0)
MCV: 92.6 fL (ref 78.0–100.0)
Platelets: 243 10*3/uL (ref 150–400)
RBC: 3.77 MIL/uL — ABNORMAL LOW (ref 3.87–5.11)
RDW: 16.8 % — ABNORMAL HIGH (ref 11.5–15.5)
WBC: 7 10*3/uL (ref 4.0–10.5)

## 2015-11-21 LAB — COMPREHENSIVE METABOLIC PANEL
ALT: 22 U/L (ref 14–54)
AST: 21 U/L (ref 15–41)
Albumin: 4.6 g/dL (ref 3.5–5.0)
Alkaline Phosphatase: 59 U/L (ref 38–126)
Anion gap: 11 (ref 5–15)
BUN: 19 mg/dL (ref 6–20)
CO2: 27 mmol/L (ref 22–32)
Calcium: 10 mg/dL (ref 8.9–10.3)
Chloride: 101 mmol/L (ref 101–111)
Creatinine, Ser: 1.05 mg/dL — ABNORMAL HIGH (ref 0.44–1.00)
GFR calc Af Amer: >60 mL/min (ref >60)
GFR calc non Af Amer: 53 mL/min — ABNORMAL LOW (ref >60)
Glucose, Bld: 95 mg/dL (ref 65–99)
Potassium: 3.4 mmol/L — ABNORMAL LOW (ref 3.5–5.1)
Sodium: 139 mmol/L (ref 135–145)
Total Bilirubin: 0.8 mg/dL (ref 0.3–1.2)
Total Protein: 7.2 g/dL (ref 6.5–8.1)

## 2015-11-21 LAB — URINE MICROSCOPIC-ADD ON

## 2015-11-21 MED ORDER — CEPHALEXIN 500 MG PO CAPS: 500.0000 mg | ORAL_CAPSULE | Freq: Three times a day (TID) | ORAL | Status: DC

## 2015-11-21 MED ORDER — CEPHALEXIN 250 MG PO CAPS
500.0000 mg | ORAL_CAPSULE | Freq: Once | ORAL | Status: AC
  Administered 2015-11-21: 500 mg via ORAL
  Filled 2015-11-21: qty 2

## 2015-11-21 MED ORDER — ONDANSETRON 8 MG PO TBDP: 8.0000 mg | ORAL_TABLET | Freq: Three times a day (TID) | ORAL | Status: DC | PRN

## 2015-11-21 NOTE — ED Notes (Signed)
MD at bedside. 

## 2015-11-21 NOTE — Discharge Instructions (Signed)

## 2015-11-21 NOTE — ED Notes (Signed)
Pt verbalized understanding of prescription use and has no further questions. Pt to follow up with pcp in 5 days for recheck. Pt ambulatory, pain free and NAD upon d/c.

## 2015-11-21 NOTE — ED Notes (Signed)
Patient transported to X-ray 

## 2015-11-23 LAB — URINE CULTURE: Culture: >=100000 — AB

## 2015-11-24 ENCOUNTER — Telehealth (HOSPITAL_BASED_OUTPATIENT_CLINIC_OR_DEPARTMENT_OTHER): Payer: Self-pay

## 2015-11-24 NOTE — Telephone Encounter (Signed)
Post ED Visit - Positive Culture Follow-up  Culture report reviewed by antimicrobial stewardship pharmacist:  []  Elenor Quinones, Pharm.D. []  Heide Guile, Pharm.D., BCPS []  Parks Neptune, Pharm.D. []  Alycia Rossetti, Pharm.D., BCPS []  [place], Pharm.D., BCPS, AAHIVP []  Legrand Como, Pharm.D., BCPS, AAHIVP []  Milus Glazier, Pharm.D. []  Stephens November, [place].D. [place] Pharm D Positive urine culture Treated with Cephalexin , organism sensitive to the same and no further patient follow-up is required at this time.  Genia Del 11/24/2015, 9:40 AM

## 2015-11-27 ENCOUNTER — Encounter: Payer: Self-pay | Admitting: Internal Medicine

## 2015-11-27 ENCOUNTER — Ambulatory Visit (INDEPENDENT_AMBULATORY_CARE_PROVIDER_SITE_OTHER): Payer: Medicare Other | Admitting: Internal Medicine

## 2015-11-27 VITALS — BP 112/66 | HR 88 | Temp 98.2°F | Wt 163.6 lb

## 2015-11-27 DIAGNOSIS — Z87440 Personal history of urinary (tract) infections: Secondary | ICD-10-CM

## 2015-11-27 DIAGNOSIS — T63461D Toxic effect of venom of wasps, accidental (unintentional), subsequent encounter: Secondary | ICD-10-CM | POA: Diagnosis not present

## 2015-11-27 DIAGNOSIS — T783XXD Angioneurotic edema, subsequent encounter: Secondary | ICD-10-CM | POA: Diagnosis not present

## 2015-11-27 DIAGNOSIS — Z09 Encounter for follow-up examination after completed treatment for conditions other than malignant neoplasm: Secondary | ICD-10-CM

## 2015-11-27 DIAGNOSIS — Z853 Personal history of malignant neoplasm of breast: Secondary | ICD-10-CM | POA: Diagnosis not present

## 2015-11-27 DIAGNOSIS — R630 Anorexia: Secondary | ICD-10-CM

## 2015-11-27 DIAGNOSIS — Z6833 Body mass index (BMI) 33.0-33.9, adult: Secondary | ICD-10-CM | POA: Diagnosis not present

## 2015-11-27 DIAGNOSIS — Z923 Personal history of irradiation: Secondary | ICD-10-CM | POA: Diagnosis not present

## 2015-11-27 DIAGNOSIS — N390 Urinary tract infection, site not specified: Secondary | ICD-10-CM | POA: Insufficient documentation

## 2015-11-27 NOTE — Assessment & Plan Note (Signed)
Patient complains of poor appetite which has been a long-standing chronic issue since her radiation treatment for breast cancer several years ago. She notes that over this time her symptoms of nausea and poor appetite has slowly been improving but over the last several months have been slightly worse. He has been intentional about trying to get nutrition in an requests advice on protein supplementation. We have provided recommendations for protein shakes and provided coupons for Boost.  She reports that she will begin to make food and then get nauseous or she will begin to eat food and then get nauseous and not able to finish. Since she is doing well if she is able to get one full meal and every day. She has been feeling fatigued as result of her poor appetite and lack of nutrition. As any significant symptoms of shortness of breath chest pain, her recent hemoglobin levels in the emergency department were low normal. She hasn't had no history of thyroid disease and TSH, 5 years ago was within normal limits. She denies any other symptoms of thyroid disease. I feel that her poor appetite is likely due to her radiation therapy. She will follow-up with her oncologist next year but I will keep a close eye on how she stewing over the next several months.  If she is unable to take in adequate nutrition, we will consider mirtazapine or Megace for appetite stimulation.

## 2015-11-27 NOTE — Assessment & Plan Note (Addendum)
Patient presented to the emergency department one week ago for symptoms of dysuria 3 days, and symptoms of nausea and feeling unwell 1 week since being stung by a wasp. She was seen in our clinic for the last spotting at that time and treated with Benadryl, H2 blocker, and Depo-Medrol injection. She was found to have Escherichia coli urinary tract infection which was pansensitive and she was sent home on 5 days of Keflex.  Today, she has no further symptoms of dysuria or urinary frequency. Says any blood in the urine. Her antibiotic course and complains of mild diarrhea since. Has any blood in the stool. Her symptoms fully resolved.

## 2015-11-27 NOTE — Assessment & Plan Note (Addendum)
Patient was stung by a wasp 2 weeks ago and was seen in our clinic at that time. She presented with angioedema of the lip. She was treated with H1-blocker, H2-blocker, and steroid injection. Patient reports that since her steroid injection her blood sugars have been stable around 140-180.  Today, she has no signs of angioedema. She has discontinued her Benadryl and ranitidine. Her symptoms a full resolved.

## 2015-11-27 NOTE — Progress Notes (Signed)
Internal Medicine Clinic Attending  I saw and evaluated the patient.  I personally confirmed the key portions of the history and exam documented by Dr. Strelow and I reviewed pertinent patient test results.  The assessment, diagnosis, and plan were formulated together and I agree with the documentation in the resident's note. 

## 2015-11-27 NOTE — Patient Instructions (Signed)
I'm glad your wasp sting and UTI are improved.  Please drink protein shakes for your nutrition. If your appetite does not improve or gets worse please call our clinic for follow up. Otherwise, I will plan to see you in 4-6 weeks.

## 2015-11-27 NOTE — Progress Notes (Signed)
CC: Follow-up wasp sting and UTI HPI: [name] is a 70 y.o. female with a h/o of type 2 diabetes, hypertension, hyperlipidemia, fatty liver disease, osteopenia who presents for follow-up of acute wasp sting 2 weeks ago and recent emergency department visit for UTI.  Please see Problem-based charting for HPI and the status of patient's chronic medical conditions.  Past Medical History:  Diagnosis Date  . Allergy   . Anemia   . Breast CA ([place])    (Rt) breast ca dx 4/12---  S/P RADICAL MASTECTOMY AND CHEMORADIATION  . Diabetes mellitus ORAL MED  . Fatty liver disease, nonalcoholic   . History of hyperkalemia   . Hx antineoplastic chemo  10/12 - 06/24/11   PACLITAX EL COMPLETED 06/24/11  . Hyperlipidemia   . Hypertension   . Mild nonproliferative diabetic retinopathy of right eye ([place]) 03/19/11   Dr. Joseph Art  . Numbness and tingling    Hx: of in fingers and toes since chemotherapy  . OA (osteoarthritis) of knee    RIGHT LEG  . Obesity   . Renal lesion     Current Outpatient Prescriptions:  .  anastrozole (ARIMIDEX) 1 MG tablet, Take 1 tablet (1 mg total) by mouth daily., Disp: 90 tablet, Rfl: 4 .  atorvastatin (LIPITOR) 40 MG tablet, Take 1 tablet (40 mg total) by mouth at bedtime., Disp: 90 tablet, Rfl: 3 .  gabapentin (NEURONTIN) 100 MG capsule, TAKE ONE CAPSULE BY MOUTH THREE TIMES DAILY, Disp: 270 capsule, Rfl: 0 .  glipiZIDE (GLUCOTROL) 5 MG tablet, Take 1 tablet (5 mg total) by mouth 2 (two) times daily before a meal., Disp: 180 tablet, Rfl: 1 .  hydrochlorothiazide (HYDRODIURIL) 25 MG tablet, Take 1 tablet (25 mg total) by mouth daily., Disp: 90 tablet, Rfl: 3 .  lisinopril (PRINIVIL,ZESTRIL) 40 MG tablet, TAKE 1 TABLET BY MOUTH DAILY, Disp: 90 tablet, Rfl: 1 .  metFORMIN (GLUCOPHAGE) 500 MG tablet, TAKE 1 TABLET(500 MG) BY MOUTH TWICE DAILY WITH A MEAL, Disp: 180 tablet, Rfl: 3 .  ondansetron (ZOFRAN ODT) 8 MG disintegrating tablet, Take 1 tablet (8 mg  total) by mouth every 8 (eight) hours as needed for nausea or vomiting., Disp: 10 tablet, Rfl: 0 .  ranitidine (ZANTAC) 150 MG tablet, TAKE 1 TABLET(150 MG) BY MOUTH TWICE DAILY, Disp: 180 tablet, Rfl: 1  Review of Systems: ROS in HPI. Otherwise, she denies chest pain, shortness of breath, changes in appetite, changes to bowel habits, tremors, heat or cold intolerance.  Physical Exam: Vitals:   11/27/15 0944  BP: 112/66  Pulse: 88  Temp: 98.2 F (36.8 C)  TempSrc: Oral  SpO2: 100%  Weight: 163 lb 9.6 oz (74.2 kg)   General appearance: alert, cooperative, appears stated age and no distress Head: Normocephalic, without obvious abnormality, atraumatic Lungs: clear to auscultation bilaterally Heart: regular rate and rhythm, S1, S2 normal, no murmur, click, rub or gallop Abdomen: soft, non-tender; bowel sounds normal; no masses,  no organomegaly Extremities: extremities normal, atraumatic, no cyanosis or edema  Assessment & Plan:  See encounters tab for problem based medical decision making. Patient seen with Dr. Angelia Mould  UTI (urinary tract infection) Patient presented to the emergency department one week ago for symptoms of dysuria 3 days, and symptoms of nausea and feeling unwell 1 week since being stung by a wasp. She was seen in our clinic for the last spotting at that time and treated with Benadryl, H2 blocker, and Depo-Medrol injection. She was found to  have Escherichia coli urinary tract infection which was pansensitive and she was sent home on 5 days of Keflex.  Today, she has no further symptoms of dysuria or urinary frequency. Says any blood in the urine. Her antibiotic course and complains of mild diarrhea since. Has any blood in the stool. Her symptoms fully resolved.  Wasp sting Patient was stung by a wasp 2 weeks ago and was seen in our clinic at that time. She presented with angioedema of the lip. She was treated with H1-blocker, H2-blocker, and steroid injection.  Patient reports that since her steroid injection her blood sugars have been stable around 140-180.  Today, she has no signs of angioedema. She has discontinued her Benadryl and ranitidine. Her symptoms a full resolved.  Poor appetite Patient complains of poor appetite which has been a long-standing chronic issue since her radiation treatment for breast cancer several years ago. She notes that over this time her symptoms of nausea and poor appetite has slowly been improving but over the last several months have been slightly worse. He has been intentional about trying to get nutrition in an requests advice on protein supplementation. We have provided recommendations for protein shakes and provided coupons for Boost.  She reports that she will begin to make food and then get nauseous or she will begin to eat food and then get nauseous and not able to finish. Since she is doing well if she is able to get one full meal and every day. She has been feeling fatigued as result of her poor appetite and lack of nutrition. As any significant symptoms of shortness of breath chest pain, her recent hemoglobin levels in the emergency department were low normal. She hasn't had no history of thyroid disease and TSH, 5 years ago was within normal limits. She denies any other symptoms of thyroid disease. I feel that her poor appetite is likely due to her radiation therapy. She will follow-up with her oncologist next year but I will keep a close eye on how she stewing over the next several months.  If she is unable to take in adequate nutrition, we will consider mirtazapine or Megace for appetite stimulation.   Signed: Holley Raring, MD 11/27/2015, 10:17 AM  Pager: [phone]

## 2015-12-21 ENCOUNTER — Ambulatory Visit (INDEPENDENT_AMBULATORY_CARE_PROVIDER_SITE_OTHER): Payer: Medicare Other | Admitting: Internal Medicine

## 2015-12-21 VITALS — BP 137/67 | HR 101 | Temp 98.7°F | Wt 164.4 lb

## 2015-12-21 DIAGNOSIS — L500 Allergic urticaria: Secondary | ICD-10-CM | POA: Diagnosis present

## 2015-12-21 MED ORDER — HYDROCORTISONE 2.5 % EX CREA: TOPICAL_CREAM | Freq: Two times a day (BID) | CUTANEOUS | 0 refills | Status: DC

## 2015-12-21 MED ORDER — HYDROXYZINE HCL 25 MG PO TABS: 25.0000 mg | ORAL_TABLET | Freq: Three times a day (TID) | ORAL | 0 refills | Status: DC | PRN

## 2015-12-21 NOTE — Patient Instructions (Signed)
Take Hydroxyzine 25 mg three time a day as needed for hives and itching.  You can also use hydrocortisone ointment for relief of itching and hives, please avoid your face.  If there is no improvement in 2 to 4 weeks, please let us know and we will consider a referral to an Allergist.

## 2015-12-21 NOTE — Progress Notes (Signed)
    CC: HIVES  HPI: [name] is a 70 y.o. female with PMHx of HTN, T2DM, HLD who presents to the clinic for complaint of urticaria.  Patient was stung by a bee in mid-July and developed urticaria and angioedema. Both of her symptoms resolved and she had been doing well. One week ago, she experienced a reccurence of her urticaria with rash involving her chest, upper back and upper extremities. She denies recurrent angioedema. She has taken benadryl which improves her symptoms, but the urticarial rash occurs again the following day. She denies associated dyspnea, wheezing, or dysphagia. She denies change in diet, medications, use of NSAIDs, new soaps, detergents, or lotions. She denies any recurrent insect bites or stings. She has not been taking ranitidine as she does not like the way it made her feel.   Past Medical History:  Diagnosis Date  . Allergy   . Anemia   . Breast CA ([place])    (Rt) breast ca dx 4/12---  S/P RADICAL MASTECTOMY AND CHEMORADIATION  . Diabetes mellitus ORAL MED  . Fatty liver disease, nonalcoholic   . History of hyperkalemia   . Hx antineoplastic chemo  10/12 - 06/24/11   PACLITAX EL COMPLETED 06/24/11  . Hyperlipidemia   . Hypertension   . Mild nonproliferative diabetic retinopathy of right eye ([place]) 03/19/11   Dr. Joseph Art  . Numbness and tingling    Hx: of in fingers and toes since chemotherapy  . OA (osteoarthritis) of knee    RIGHT LEG  . Obesity   . Renal lesion     Review of Systems: A complete ROS was negative except as noted in HPI.   Physical Exam: Vitals:   12/21/15 1609  BP: 137/67  Pulse: (!) 101  Temp: 98.7 F (37.1 C)  SpO2: 99%  Weight: 164 lb 6.4 oz (74.6 kg)   General: Vital signs reviewed.  Patient is well-developed and well-nourished, in no acute distress and cooperative with exam.  Neck: Multiple skin tags. Cardiovascular: Tachycardic, regular rhythm, S1 normal, S2 normal, no murmurs, gallops, or  rubs. Pulmonary/Chest: Clear to auscultation bilaterally, no wheezes, rales, or rhonchi. Abdominal: Soft, non-tender, non-distended, BS + Extremities: No lower extremity edema bilaterally Skin: Warm, dry and intact. No rashes or erythema. No evidence of urticaria or excoriations. Psychiatric: Normal mood and affect. speech and behavior is normal. Cognition and memory are normal.   Assessment & Plan:  See encounters tab for problem based medical decision making. Patient discussed with Dr. Lynnae January

## 2015-12-21 NOTE — Assessment & Plan Note (Addendum)
Patient was stung by a bee in mid-July and developed urticaria and angioedema. Both of her symptoms resolved and she had been doing well. One week ago, she experienced a reccurence of her urticaria with rash involving her chest, upper back and upper extremities. She denies recurrent angioedema. She has taken benadryl which improves her symptoms, but the urticarial rash occurs again the following day. She denies associated dyspnea, wheezing, or dysphagia. She denies change in diet, medications, use of NSAIDs, new soaps, detergents, or lotions. She denies any recurrent insect bites or stings. She has not been taking ranitidine as she does not like the way it made her feel. On exam, skin is warm, dry and intact. No rashes or erythema. No evidence of urticaria or excoriations.  Assessment: Allergic Urticaria possibly secondary to recent immune response to bee sting.   Plan: -Hydroxyzine 25 mg TID prn -Hydrocortisone 2.5% ointment (avoid face) -Avoid new lotions/soaps/detergents -If no improvement in 2-4 weeks, patient will notify us and we will consider an Allergy Immunology referral at that time

## 2015-12-24 ENCOUNTER — Telehealth: Payer: Self-pay | Admitting: *Deleted

## 2015-12-24 NOTE — Progress Notes (Signed)
Internal Medicine Clinic Attending  Case discussed with Dr. Burns soon after the resident saw the patient.  We reviewed the resident's history and exam and pertinent patient test results.  I agree with the assessment, diagnosis, and plan of care documented in the resident's note. 

## 2015-12-24 NOTE — Telephone Encounter (Addendum)
Prior Authorization for Hydrxyzine HCL faxed to Cover[employer]Meds .  Awaiting decision from Insurance.  Sander Nephew, RN 12/24/2015 4:22 PM  Prior Authorization approved 12/21/2015 until further notice for Hydroxyzine HCL 25 mg tablets.Sander Nephew, RN 12/27/2015 9:59 AM.

## 2015-12-27 ENCOUNTER — Ambulatory Visit
Admission: RE | Admit: 2015-12-27 | Discharge: 2015-12-27 | Disposition: A | Discharge status: Home / Self Care | Payer: Medicare Other | Source: Ambulatory Visit | Attending: Oncology | Admitting: Oncology

## 2015-12-27 ENCOUNTER — Other Ambulatory Visit: Payer: Self-pay | Admitting: Internal Medicine

## 2015-12-27 DIAGNOSIS — I152 Hypertension secondary to endocrine disorders: Secondary | ICD-10-CM

## 2015-12-27 DIAGNOSIS — I10 Essential (primary) hypertension: Principal | ICD-10-CM

## 2015-12-27 DIAGNOSIS — E1159 Type 2 diabetes mellitus with other circulatory complications: Secondary | ICD-10-CM

## 2015-12-27 DIAGNOSIS — Z1231 Encounter for screening mammogram for malignant neoplasm of breast: Secondary | ICD-10-CM | POA: Diagnosis not present

## 2016-01-02 ENCOUNTER — Encounter: Payer: Medicare Other | Admitting: Nurse Practitioner

## 2016-01-02 ENCOUNTER — Other Ambulatory Visit: Payer: Medicare Other

## 2016-01-02 ENCOUNTER — Telehealth: Payer: Self-pay | Admitting: Internal Medicine

## 2016-01-02 NOTE — Telephone Encounter (Signed)
AT. REMINDER CALL, LMTCB °

## 2016-01-03 ENCOUNTER — Encounter: Payer: Self-pay | Admitting: Internal Medicine

## 2016-01-03 ENCOUNTER — Other Ambulatory Visit: Payer: Self-pay | Admitting: Oncology

## 2016-01-03 ENCOUNTER — Ambulatory Visit (INDEPENDENT_AMBULATORY_CARE_PROVIDER_SITE_OTHER): Payer: Medicare Other | Admitting: Internal Medicine

## 2016-01-03 ENCOUNTER — Other Ambulatory Visit: Payer: Self-pay | Admitting: Internal Medicine

## 2016-01-03 ENCOUNTER — Other Ambulatory Visit: Payer: Self-pay | Admitting: Adult Health

## 2016-01-03 VITALS — BP 129/67 | HR 102 | Temp 98.3°F | Wt 165.0 lb

## 2016-01-03 DIAGNOSIS — I10 Essential (primary) hypertension: Secondary | ICD-10-CM

## 2016-01-03 DIAGNOSIS — I152 Hypertension secondary to endocrine disorders: Secondary | ICD-10-CM | POA: Diagnosis not present

## 2016-01-03 DIAGNOSIS — E785 Hyperlipidemia, unspecified: Secondary | ICD-10-CM

## 2016-01-03 DIAGNOSIS — L500 Allergic urticaria: Secondary | ICD-10-CM

## 2016-01-03 DIAGNOSIS — Z7984 Long term (current) use of oral hypoglycemic drugs: Secondary | ICD-10-CM | POA: Diagnosis not present

## 2016-01-03 DIAGNOSIS — Z79899 Other long term (current) drug therapy: Secondary | ICD-10-CM | POA: Diagnosis not present

## 2016-01-03 DIAGNOSIS — E1121 Type 2 diabetes mellitus with diabetic nephropathy: Secondary | ICD-10-CM

## 2016-01-03 DIAGNOSIS — E1169 Type 2 diabetes mellitus with other specified complication: Secondary | ICD-10-CM

## 2016-01-03 DIAGNOSIS — E1159 Type 2 diabetes mellitus with other circulatory complications: Secondary | ICD-10-CM

## 2016-01-03 DIAGNOSIS — G6289 Other specified polyneuropathies: Secondary | ICD-10-CM

## 2016-01-03 DIAGNOSIS — N289 Disorder of kidney and ureter, unspecified: Secondary | ICD-10-CM

## 2016-01-03 DIAGNOSIS — N058 Unspecified nephritic syndrome with other morphologic changes: Secondary | ICD-10-CM

## 2016-01-03 DIAGNOSIS — E1142 Type 2 diabetes mellitus with diabetic polyneuropathy: Secondary | ICD-10-CM | POA: Diagnosis not present

## 2016-01-03 DIAGNOSIS — E1129 Type 2 diabetes mellitus with other diabetic kidney complication: Secondary | ICD-10-CM | POA: Diagnosis not present

## 2016-01-03 DIAGNOSIS — C50911 Malignant neoplasm of unspecified site of right female breast: Secondary | ICD-10-CM

## 2016-01-03 LAB — POCT GLYCOSYLATED HEMOGLOBIN (HGB A1C): Hemoglobin A1C: 7.3

## 2016-01-03 LAB — GLUCOSE, CAPILLARY: Glucose-Capillary: 219 mg/dL — ABNORMAL HIGH (ref 65–99)

## 2016-01-03 MED ORDER — FAMOTIDINE 20 MG PO TABS: 20.0000 mg | ORAL_TABLET | Freq: Two times a day (BID) | ORAL | 1 refills | Status: DC

## 2016-01-03 NOTE — Patient Instructions (Signed)
Thanks for coming in to see me in the clinic today.  For your skin rash, I recommend continuing to use the hydrocortisone cream, as well as taking the hydroxyzine pill 3 times a day. You can also use famotidine which is available over-the-counter to help with these symptoms. If her symptoms have not gotten better in the next 3-4 weeks please call the clinic and we will try to come up with an alternative plan.  Keep the good work in terms of your diabetes. Your blood sugars appear to be in good condition and I believe if we can begin to cut out some of your Pepsi then you'll be in great shape.  Your blood pressure is doing excellently today. Continue to eat healthfully with fruits and vegetables and a low-salt diet. Trying to be as active as possible with daily walking is also important for your overall health.  Review can follow-up with me in clinic in 3 months, and we will continue to check on your diabetes and blood pressure.

## 2016-01-03 NOTE — Assessment & Plan Note (Signed)
Patient initially presented several weeks ago with bee sting that resulted in angioedema and allergic urticaria. She was treated with steroids and Benadryl and famotidine. Her symptoms initially resolved but then recurred several weeks later when she presented to the clinic with recurrence of urticaria. She was prescribed hydrocortisone cream by Dr. Quay Burow which she has been using topically with good relief. However she was also prescribed hydroxyzine 25 mg 3 times a day which she was unable to fill due to insurance reasons. She still complaining that these rashes are recurring daily they typically pop up and resolve after several hours.  I recommended that she continue with the hydrocortisone cream topically as needed. I also recommended that she fill her hydroxyzine which was recently approved by insurance and use this medication for several weeks. I have also prescribed some famotidine 20 mg. She was originally prescribed ranitidine which she reported having an adverse reaction, "didn't make me feel right".  Urticaria does not resolve on the above treatment I would recommend further evaluation for other possible causes of urticaria. Specifically in the context of her history of malignancy would be concerned for a hernia plastic syndrome that would deserve further workup.

## 2016-01-03 NOTE — Assessment & Plan Note (Signed)
Patient is currently taking her atorvastatin 40 mg daily with good compliance. She reports no adverse reactions this medication. Continue with current therapy.

## 2016-01-03 NOTE — Telephone Encounter (Signed)
Pharmacy/Patient is requesting 90-day supply.Despina Hidden Cassady8/31/20173:52 PM

## 2016-01-03 NOTE — Progress Notes (Signed)
CC: Follow-up of diabetes, hypertension, allergic urticaria HPI: [name] is a 70 y.o. female with a h/o of breast cancer, diabetes, hypertension, hyperlipidemia who presents for routine follow-up of the above as well as for continued symptoms of allergic urticaria following a bee sting one month ago.  Please see Problem-based charting for HPI and the status of patient's chronic medical conditions.  Past Medical History:  Diagnosis Date  . Allergy   . Anemia   . Breast CA ([place])    (Rt) breast ca dx 4/12---  S/P RADICAL MASTECTOMY AND CHEMORADIATION  . Diabetes mellitus ORAL MED  . Fatty liver disease, nonalcoholic   . History of hyperkalemia   . Hx antineoplastic chemo  10/12 - 06/24/11   PACLITAX EL COMPLETED 06/24/11  . Hyperlipidemia   . Hypertension   . Mild nonproliferative diabetic retinopathy of right eye ([place]) 03/19/11   Dr. Joseph Art  . Numbness and tingling    Hx: of in fingers and toes since chemotherapy  . OA (osteoarthritis) of knee    RIGHT LEG  . Obesity   . Renal lesion    Review of Systems: ROS in HPI. Otherwise: Review of Systems  Constitutional: Negative for chills, fever and weight loss.  Respiratory: Negative for cough and shortness of breath.   Cardiovascular: Negative for chest pain and leg swelling.  Gastrointestinal: Negative for abdominal pain, constipation, diarrhea, nausea and vomiting.  Genitourinary: Negative for dysuria, frequency and urgency.  Skin: Positive for itching and rash.  Neurological: Positive for tingling.    Physical Exam: Vitals:   01/03/16 1410  BP: 129/67  Pulse: (!) 102  Temp: 98.3 F (36.8 C)  TempSrc: Oral  SpO2: 100%  Weight: 165 lb (74.8 kg)   Physical Exam  Constitutional: [name], place, and time. She appears well-developed. She is cooperative. No distress.  Cardiovascular: Normal rate, regular rhythm, normal heart sounds and normal pulses.  Exam reveals no gallop.   No  murmur heard. Pulmonary/Chest: Effort normal and breath sounds normal. No respiratory distress. She has no wheezes. She has no rhonchi. She has no rales. Breasts are symmetrical.  Abdominal: Soft. Bowel sounds are normal. There is no tenderness.  Musculoskeletal: Normal range of motion. She exhibits no edema.  Neurological: She is alert and oriented to person, place, and time. She has normal strength. No sensory deficit. She exhibits normal muscle tone. Coordination normal.    Assessment & Plan:  See encounters tab for problem based medical decision making. Patient seen with Dr. Eppie Gibson  Type 2 diabetes mellitus with renal manifestations Patient reports that she has had good control of her blood sugars recently. She reports that her fasting sugars are typically between 70 and 110. She has had only several instances where her blood sugars were in the 70s, during these times she has felt somewhat fatigued and lightheaded. She reports that when she has these symptoms she immediately goes and drinks something sugary or eat some food. She has not had any blood sugars less than 70. Today is 7.3. She reports that her biggest barrier to improving this number is her habit of drinking Pepsi. We have discussed strategies for reducing her reliance on Pepsi.  Her diabetes is currently under good control with her metformin and glipizide. I would not make any recommendations for changes to this currently. The patient does note she has a poor appetite at times but this is been improving. I noted that if the patient continues to  have difficulty with appetite it may be important to consider ways to boost her nutritional intake. He is also important that she keep a regular diet in the setting of her diabetes medication regimen in order to avoid hypoglycemia.  Peripheral neuropathy Patient reports that she has had some numbness and tingling in the bilateral upper extremities. The symptoms have been persistent since her  treatment for cancer several years ago. She reports that the symptoms have gradually been decreasing in intensity and have not recently been worse than normal. She has full upper extremity strength and bilaterally symmetric sensation. She currently takes gabapentin 100 mg 3 times a day. We will continue him on her symptoms if they are not changing in quality or intensity but continue improve I would not recommend any treatment for this.  Hypertension associated with diabetes BP Readings from Last 3 Encounters:  01/03/16 129/67  12/21/15 137/67  11/27/15 112/66       Component Value Date/Time   NA 139 11/21/2015 0040   NA 140 10/10/2015 1406   NA 141 12/28/2014 0939   K 3.4 (L) 11/21/2015 0040   K 3.3 (L) 12/28/2014 0939   CREATININE 1.05 (H) 11/21/2015 0040   CREATININE 1.2 (H) 12/28/2014 0939   Assessment:  Patient blood pressure is under good control. She reports that she checks her blood pressure regularly at home and they typically run no higher than [id number] systolic. Her blood pressure is under excellent control in the clinic today.  Plan:   -Medication changes: none  -Labs: none -Other plans: consider switching from lisinopril if urticaria is not improved -Encourage home BP monitoring 3 times per week or at drug store occasionally  -Encourage regular exercise and healthy diet (decreased salt intake)   Hyperlipidemia Patient is currently taking her atorvastatin 40 mg daily with good compliance. She reports no adverse reactions this medication. Continue with current therapy.  Allergic urticaria Patient initially presented several weeks ago with bee sting that resulted in angioedema and allergic urticaria. She was treated with steroids and Benadryl and famotidine. Her symptoms initially resolved but then recurred several weeks later when she presented to the clinic with recurrence of urticaria. She was prescribed hydrocortisone cream by Dr. Quay Burow which she has been using topically  with good relief. However she was also prescribed hydroxyzine 25 mg 3 times a day which she was unable to fill due to insurance reasons. She still complaining that these rashes are recurring daily they typically pop up and resolve after several hours.  I recommended that she continue with the hydrocortisone cream topically as needed. I also recommended that she fill her hydroxyzine which was recently approved by insurance and use this medication for several weeks. I have also prescribed some famotidine 20 mg. She was originally prescribed ranitidine which she reported having an adverse reaction, "didn't make me feel right".  Urticaria does not resolve on the above treatment I would recommend further evaluation for other possible causes of urticaria. Specifically in the context of her history of malignancy would be concerned for a hernia plastic syndrome that would deserve further workup.  Signed: Holley Raring, MD 01/03/2016, 5:04 PM  Pager: [phone]

## 2016-01-03 NOTE — Assessment & Plan Note (Signed)
BP Readings from Last 3 Encounters:  01/03/16 129/67  12/21/15 137/67  11/27/15 112/66       Component Value Date/Time   NA 139 11/21/2015 0040   NA 140 10/10/2015 1406   NA 141 12/28/2014 0939   K 3.4 (L) 11/21/2015 0040   K 3.3 (L) 12/28/2014 0939   CREATININE 1.05 (H) 11/21/2015 0040   CREATININE 1.2 (H) 12/28/2014 0939   Assessment:  Patient blood pressure is under good control. She reports that she checks her blood pressure regularly at home and they typically run no higher than [id number] systolic. Her blood pressure is under excellent control in the clinic today.  Plan:   -Medication changes: none  -Labs: none -Other plans: consider switching from lisinopril if urticaria is not improved -Encourage home BP monitoring 3 times per week or at drug store occasionally  -Encourage regular exercise and healthy diet (decreased salt intake)

## 2016-01-03 NOTE — Assessment & Plan Note (Signed)
Patient reports that she has had good control of her blood sugars recently. She reports that her fasting sugars are typically between 70 and 110. She has had only several instances where her blood sugars were in the 70s, during these times she has felt somewhat fatigued and lightheaded. She reports that when she has these symptoms she immediately goes and drinks something sugary or eat some food. She has not had any blood sugars less than 70. Today is 7.3. She reports that her biggest barrier to improving this number is her habit of drinking Pepsi. We have discussed strategies for reducing her reliance on Pepsi.  Her diabetes is currently under good control with her metformin and glipizide. I would not make any recommendations for changes to this currently. The patient does note she has a poor appetite at times but this is been improving. I noted that if the patient continues to have difficulty with appetite it may be important to consider ways to boost her nutritional intake. He is also important that she keep a regular diet in the setting of her diabetes medication regimen in order to avoid hypoglycemia.

## 2016-01-03 NOTE — Assessment & Plan Note (Addendum)
Patient reports that she has had some numbness and tingling in the bilateral upper extremities. The symptoms have been persistent since her treatment for cancer several years ago. She reports that the symptoms have gradually been decreasing in intensity and have not recently been worse than normal. She has full upper extremity strength and bilaterally symmetric sensation. She currently takes gabapentin 100 mg 3 times a day. We will continue him on her symptoms if they are not changing in quality or intensity but continue improve I would not recommend any treatment for this.

## 2016-01-04 ENCOUNTER — Encounter: Payer: Self-pay | Admitting: Adult Health

## 2016-01-04 ENCOUNTER — Other Ambulatory Visit (HOSPITAL_BASED_OUTPATIENT_CLINIC_OR_DEPARTMENT_OTHER): Payer: Medicare Other

## 2016-01-04 ENCOUNTER — Ambulatory Visit (HOSPITAL_BASED_OUTPATIENT_CLINIC_OR_DEPARTMENT_OTHER): Payer: Medicare Other | Admitting: Adult Health

## 2016-01-04 ENCOUNTER — Other Ambulatory Visit: Payer: Self-pay | Admitting: *Deleted

## 2016-01-04 ENCOUNTER — Telehealth: Payer: Self-pay | Admitting: Adult Health

## 2016-01-04 VITALS — BP 123/54 | HR 88 | Temp 98.7°F | Resp 18 | Ht 59.0 in | Wt 165.3 lb

## 2016-01-04 DIAGNOSIS — N941 Unspecified dyspareunia: Secondary | ICD-10-CM | POA: Diagnosis not present

## 2016-01-04 DIAGNOSIS — C50911 Malignant neoplasm of unspecified site of right female breast: Secondary | ICD-10-CM

## 2016-01-04 DIAGNOSIS — N898 Other specified noninflammatory disorders of vagina: Secondary | ICD-10-CM | POA: Diagnosis not present

## 2016-01-04 DIAGNOSIS — Z170 Estrogen receptor positive status [ER+]: Secondary | ICD-10-CM | POA: Diagnosis not present

## 2016-01-04 DIAGNOSIS — Z1231 Encounter for screening mammogram for malignant neoplasm of breast: Secondary | ICD-10-CM

## 2016-01-04 LAB — COMPREHENSIVE METABOLIC PANEL
ALT: 24 U/L (ref 0–55)
AST: 16 U/L (ref 5–34)
Albumin: 4.1 g/dL (ref 3.5–5.0)
Alkaline Phosphatase: 75 U/L (ref 40–150)
Anion Gap: 13 mEq/L — ABNORMAL HIGH (ref 3–11)
BUN: 16.7 mg/dL (ref 7.0–26.0)
CO2: 29 mEq/L (ref 22–29)
Calcium: 9.5 mg/dL (ref 8.4–10.4)
Chloride: 100 mEq/L (ref 98–109)
Creatinine: 1.1 mg/dL (ref 0.6–1.1)
EGFR: 60 mL/min/{1.73_m2} — ABNORMAL LOW (ref >90)
Glucose: 188 mg/dl — ABNORMAL HIGH (ref 70–140)
Potassium: 3.7 mEq/L (ref 3.5–5.1)
Sodium: 142 mEq/L (ref 136–145)
Total Bilirubin: 0.43 mg/dL (ref 0.20–1.20)
Total Protein: 7 g/dL (ref 6.4–8.3)

## 2016-01-04 LAB — CBC WITH DIFFERENTIAL/PLATELET
BASO%: 1 % (ref 0.0–2.0)
Basophils Absolute: 0 10*3/uL (ref 0.0–0.1)
EOS%: 1.7 % (ref 0.0–7.0)
Eosinophils Absolute: 0.1 10*3/uL (ref 0.0–0.5)
HCT: 34.3 % — ABNORMAL LOW (ref 34.8–46.6)
HGB: 11 g/dL — ABNORMAL LOW (ref 11.6–15.9)
LYMPH%: 31.6 % (ref 14.0–49.7)
MCH: 30.3 pg (ref 25.1–34.0)
MCHC: 32 g/dL (ref 31.5–36.0)
MCV: 94.6 fL (ref 79.5–101.0)
MONO#: 0.4 10*3/uL (ref 0.1–0.9)
MONO%: 7.7 % (ref 0.0–14.0)
NEUT#: 2.7 10*3/uL (ref 1.5–6.5)
NEUT%: 58 % (ref 38.4–76.8)
Platelets: 184 10*3/uL (ref 145–400)
RBC: 3.62 10*6/uL — ABNORMAL LOW (ref 3.70–5.45)
RDW: 17.4 % — ABNORMAL HIGH (ref 11.2–14.5)
WBC: 4.7 10*3/uL (ref 3.9–10.3)
lymph#: 1.5 10*3/uL (ref 0.9–3.3)

## 2016-01-04 MED ORDER — ANASTROZOLE 1 MG PO TABS: 1.0000 mg | ORAL_TABLET | Freq: Every day | ORAL | 4 refills | Status: DC

## 2016-01-04 NOTE — Progress Notes (Signed)
CLINIC:  Survivorship   REASON FOR VISIT:  Routine follow-up for history of breast cancer.   BRIEF ONCOLOGIC HISTORY:  (from Dr. Virgie Dad visit on 01/02/15)   INTERVAL HISTORY:  [name] presents to the [facility] today for routine follow-up for her history of breast cancer.  Overall, she reports feeling quite well. She continues on the anastrazole and is tolerating it well, with the exception of vaginal dryness.  She has some hot flashes, but they are not severe and are manageable.  She has some peripheral neuropathy, which has greatly improved since her gabapentin dosage was increased.   Unrelated to her breast cancer, she was recently stung by a wasp on her head and has been struggling with itching. She has hydrocortisone cream and Vistaril tabs to help with this.   Dr. Jana Hakim had recommended that the patient participate in the [facility], but she did not ever get signed up for that.  She is not as physically active as she would like to be, but is trying to work on it.    REVIEW OF SYSTEMS:  Review of Systems  Constitutional: Negative.   HENT: Negative.   Eyes: Negative.   Respiratory: Negative.   Cardiovascular: Negative.   Gastrointestinal: Negative.   Genitourinary: Negative.   Musculoskeletal: Negative.   Skin: Positive for itching and rash.  Neurological:       (+) peripheral neuropathy, but improved   Endo/Heme/Allergies:       (+) diabetes    Psychiatric/Behavioral: Negative.   GU: Denies vaginal bleeding, discharge. (+) vaginal dryness.  Breast: Denies any new nodularity, masses, tenderness, nipple changes, or nipple discharge.    A 14-point review of systems was completed and was negative, except as noted above.    PAST MEDICAL/SURGICAL HISTORY:  Past Medical History:  Diagnosis Date  . Allergy   . Anemia   . Breast CA ([place])    (Rt) breast ca dx 4/12---  S/P RADICAL MASTECTOMY AND CHEMORADIATION  . Diabetes mellitus ORAL MED  .  Fatty liver disease, nonalcoholic   . History of hyperkalemia   . Hx antineoplastic chemo  10/12 - 06/24/11   PACLITAX EL COMPLETED 06/24/11  . Hyperlipidemia   . Hypertension   . Mild nonproliferative diabetic retinopathy of right eye ([place]) 03/19/11   Dr. Joseph Art  . Numbness and tingling    Hx: of in fingers and toes since chemotherapy  . OA (osteoarthritis) of knee    RIGHT LEG  . Obesity   . Renal lesion    Past Surgical History:  Procedure Laterality Date  . COLONOSCOPY W/ POLYPECTOMY     Hx; of  . I&D KNEE WITH POLY EXCHANGE Right 09/03/2012   Procedure: IRRIGATION AND DEBRIDEMENT RIGHT KNEE WITH POLY EXCHANGE;  Surgeon: Kerin Salen, MD;  Location: [place];  Service: Orthopedics;  Laterality: Right;  . knee arthroscopy  10-28-1999   RIGHT  . Laparoscopy with laparoscopic right salpingo oophorectomy and lysis of pelvic and abdominal adhesions  March 2013   Dr. Nori Riis   . MASTECTOMY MODIFIED RADICAL  12-04-2010   W/ LEFT PAC PLACEMENT (RIGHT BREAST W/ AXILLARY CONTENTS/ NODE BX'S  . PORT-A-CATH REMOVAL  09/24/2011   Procedure: REMOVAL PORT-A-CATH;  Surgeon: Joyice Faster. Cornett, MD;  Location: [facility];  Service: General;  Laterality: N/A;  . PORTACATH PLACEMENT     REPLACED PAC DUE TO MALFUNCTION (LEFT)  . TOTAL KNEE ARTHROPLASTY Right 08/16/2012   Procedure: TOTAL KNEE ARTHROPLASTY;  Surgeon: Pilar Plate  Karmen Bongo, MD;  Location: [place];  Service: Orthopedics;  Laterality: Right;  . TRANSTHORACIC ECHOCARDIOGRAM  10-29-2010   NORMAL LVF/ EF 55-60%/ MILD MV REGURG  . TUBAL LIGATION  YRS AGO  . VAGINAL HYSTERECTOMY  AGE 37   W/ LSO     ALLERGIES:  Allergies  Allergen Reactions  . Metformin Other (See Comments)    REACTION, diarrhea: 850 mg - diarrhea but tolerates 500 mg daily     CURRENT MEDICATIONS:  Outpatient Encounter Prescriptions as of 01/04/2016  Medication Sig  . anastrozole (ARIMIDEX) 1 MG tablet Take 1 tablet (1 mg total) by mouth daily.  Marland Kitchen atorvastatin (LIPITOR) 40  MG tablet Take 1 tablet (40 mg total) by mouth at bedtime.  . famotidine (PEPCID) 20 MG tablet Take 1 tablet (20 mg total) by mouth 2 (two) times daily.  Marland Kitchen gabapentin (NEURONTIN) 100 MG capsule TAKE ONE CAPSULE BY MOUTH THREE TIMES DAILY  . glipiZIDE (GLUCOTROL) 5 MG tablet Take 1 tablet (5 mg total) by mouth 2 (two) times daily before a meal.  . hydrochlorothiazide (HYDRODIURIL) 25 MG tablet TAKE 1 TABLET(25 MG) BY MOUTH DAILY  . hydrocortisone 2.5 % cream Apply topically 2 (two) times daily.  Marland Kitchen lisinopril (PRINIVIL,ZESTRIL) 40 MG tablet TAKE 1 TABLET BY MOUTH DAILY  . metFORMIN (GLUCOPHAGE) 500 MG tablet TAKE 1 TABLET(500 MG) BY MOUTH TWICE DAILY WITH A MEAL  . hydrOXYzine (ATARAX/VISTARIL) 25 MG tablet Take 1 tablet (25 mg total) by mouth 3 (three) times daily as needed. (Patient not taking: Reported on 01/04/2016)  . ondansetron (ZOFRAN ODT) 8 MG disintegrating tablet Take 1 tablet (8 mg total) by mouth every 8 (eight) hours as needed for nausea or vomiting. (Patient not taking: Reported on 01/04/2016)  . [DISCONTINUED] anastrozole (ARIMIDEX) 1 MG tablet Take 1 tablet (1 mg total) by mouth daily.  . [DISCONTINUED] hydrochlorothiazide (HYDRODIURIL) 25 MG tablet Take 1 tablet (25 mg total) by mouth daily. (Patient not taking: Reported on 01/04/2016)   No facility-administered encounter medications on file as of 01/04/2016.      ONCOLOGIC FAMILY HISTORY:  Family History  Problem Relation Age of Onset  . Stroke Father   . Heart disease Sister   . Cancer Sister     breast  . Heart disease Brother   . Cancer Sister     COLON CANCER    GENETIC COUNSELING/TESTING: BRCA2 (+).   SOCIAL HISTORY:  [name] is married and lives with her husband in [place], [place].  She is retired; he works as a Dealer.  They have been married for 35+ years.  She has 4 daughters and 3 grandsons.  She denies any current or history of tobacco, alcohol, or illicit drug use.     PHYSICAL EXAMINATION:  Vital  Signs: Vitals:   01/04/16 1112  BP: (!) 123/54  Pulse: 88  Resp: 18  Temp: 98.7 F (37.1 C)   Filed Weights   01/04/16 1112  Weight: 165 lb 4.8 oz (75 kg)   General: Well-nourished, well-appearing female in no acute distress.  She is unaccompanied today. HEENT: Head is normocephalic.  Pupils equal and reactive to light. Conjunctivae clear without exudate.  Sclerae anicteric. Oral mucosa is pink, moist.  Oropharynx is pink without lesions or erythema.  Lymph: No cervical, supraclavicular, or infraclavicular lymphadenopathy noted on palpation.  Cardiovascular: Regular rate and rhythm.Marland Kitchen Respiratory: Clear to auscultation bilaterally. Chest expansion symmetric; breathing non-labored.  Breast Exam:  -Left breast: Appreciable skin thickening, particularly in the intramammary  fold.  No palpable masses.  No skin redness or peau d'orange appearance; no nipple retraction or nipple discharge. -Right chest wall: s/p mastectomy. No nodularity or masses palpated; healed scar without erythema or nodularity. -Axilla: No axillary adenopathy bilaterally.  GI: Abdomen soft and round; non-tender, non-distended. Bowel sounds normoactive. No hepatosplenomegaly.   GU: Deferred.  Neuro: No focal deficits. Steady gait.  Psych: Mood and affect normal and appropriate for situation.  Extremities: No edema. Skin: Warm and dry.  LABORATORY DATA:  CBC    Component Value Date/Time   WBC 4.7 01/04/2016 1039   WBC 7.0 11/21/2015 0040   RBC 3.62 (L) 01/04/2016 1039   RBC 3.77 (L) 11/21/2015 0040   HGB 11.0 (L) 01/04/2016 1039   HCT 34.3 (L) 01/04/2016 1039   PLT 184 01/04/2016 1039   PLT 239 10/10/2015 1406   MCV 94.6 01/04/2016 1039   MCH 30.3 01/04/2016 1039   MCH 30.0 11/21/2015 0040   MCHC 32.0 01/04/2016 1039   MCHC 32.4 11/21/2015 0040   RDW 17.4 (H) 01/04/2016 1039   LYMPHSABS 1.5 01/04/2016 1039   MONOABS 0.4 01/04/2016 1039   EOSABS 0.1 01/04/2016 1039   BASOSABS 0.0 01/04/2016 1039   CMP  Latest Ref Rng & Units 01/04/2016 11/21/2015 10/10/2015  Glucose 70 - 140 mg/dl 188(H) 95 93  BUN 7.0 - 26.0 mg/dL 16._0 Creatinine 0.6 - 1.1 mg/dL 1.1 1.05(H) 0.91  Sodium 136 - 145 mEq/L 142 139 140  Potassium 3.5 - 5.1 mEq/L 3.7 3.4(L) 3.7  Chloride 101 - 111 mmol/L - 101 97  CO2 22 - 29 mEq/L _1 Calcium 8.4 - 10.4 mg/dL 9.5 10.0 10.1  Total Protein 6.4 - 8.3 g/dL 7.0 7.2 6.9  Total Bilirubin 0.20 - 1.20 mg/dL 0.43 0.8 0.3  Alkaline Phos 40 - 150 U/L 75 59 65  AST 5 - 34 U/L _2 ALT 0 - 55 U/L _3 *Labs reviewed with patient and are largely stable.    DIAGNOSTIC IMAGING:  Most recent mammogram: 12/27/15 at [facility]:  Ms.. Urey is a pleasant 70 y.o. female with history of Stage IIA right breast invasive ductal carcinoma, ER+/PR+/HER2-, BRCA2 (+) diagnosed in 12/2010, treated with right mastectomy, adjuvant chemo with Adriamycin/Cytoxan x 4, then Taxol x 12. She went on to have adjuvant radiation and started anti-estrogen therapy with Anastrazole in 09/2011; planned duration of treatment is 5 years.  Of note, she also had a TAH/BSO in 07/2011.  She presents to the Survivorship Clinic for surveillance and routine follow-up.   1. History of Stage IIA right breast cancer:  Ms. Sherrard is currently clinically and radiographically without evidence of disease or recurrence of breast cancer.  She does have some intramammary fold skin thickness on exam of the left (unaffected) breast.  However, her most recent unilateral left breast mammogram completed on 12/27/15 is reassuring.  She will follow-up with her medical oncologist, Dr. Jana Hakim, 1 year (12/2016) with history and physical exam per surveillance protocol.  She will continue the anastrazole through next year, when she will complete 5 years of anti-estrogen therapy and will have the option to "graduate" from follow-up here at the cancer center.  I did discuss with her the option of  continuing to be seen in the Survivorship Clinic here at the cancer center annually, if she would prefer. I let her know that she could think about it  and talk with Dr. Jana Hakim next year at her visit.  I am happy to continue to see her, if she chooses.  I also encouraged her to call me with any questions or concerns before her next visit here at the cancer center and I would be happy to see her sooner, if needed.    2. Vaginal dryness/Pain with intercourse: Ms. Blakeney reports some vaginal dryness and pain with intercourse, likely secondary to her anti-estrogen therapy with anastrazole.  I discussed with her the option of using coconut oil as a vaginal moisturizer; some patients also find the coconut oil helpful as a lubricant for intercourse as well.  We discussed that vaginal dryness is an extremely common concern in both menopause and in women treated with anti-estrogen therapy.  She will give the coconut oil a try. I let her know that if she does not like it as a lubricant for intercourse, then she could continue to use it as a moisturizer and use a more traditional lubricant like Astroglide or [place] for intercourse. She voiced understanding and agreed with this plan.   3. Bone health:  Given Ms. Salguero's age, history of breast cancer, and her current treatment regimen including anti-estrogen therapy with anastrazole, she is at risk for bone demineralization.  Her last DEXA scan was on 12/25/14 and was normal, which is encouraging.  I will defer any future testing to Dr. Jana Hakim, as clinically indicated.  In the meantime, she was encouraged to increase her consumption of foods rich in calcium, as well as increase her weight-bearing activities.  She was given education on specific food and activities to promote bone health.  4. Health maintenance and wellness promotion: Ms. Nasby was encouraged to consume 5-7 servings of fruits and vegetables per day. She was also encouraged to engage in moderate to vigorous  exercise for 30 minutes per day most days of the week. She was instructed to limit her alcohol consumption and continue to abstain from tobacco use.    Dispo:  -Return to cancer center to see Dr. Jana Hakim in 12/2016 -Mammogram (left breast screening only) will be due in 12/2016; orders placed today.    A total of 20 minutes of face-to-face time was spent with this patient with greater than 50% of that time in counseling and care-coordination.   Jillian Craze, NP Survivorship Program [place] [phone]   Note: PRIMARY CARE PROVIDER [name], [place] [phone]

## 2016-01-04 NOTE — Telephone Encounter (Signed)
appt made and avs printed °

## 2016-01-04 NOTE — Progress Notes (Signed)
Patient ID: [name], female   DOB: 12-29-1945, 70 y.o.   MRN: [id number]  I saw and evaluated the patient.  I personally confirmed the key portions of Dr. Jamse Arn history and exam and reviewed pertinent patient test results.  The assessment, diagnosis, and plan were formulated together and I agree with the documentation in the resident's note.

## 2016-01-15 ENCOUNTER — Ambulatory Visit (INDEPENDENT_AMBULATORY_CARE_PROVIDER_SITE_OTHER): Payer: Medicare Other | Admitting: Internal Medicine

## 2016-01-15 VITALS — BP 131/60 | HR 93 | Temp 98.2°F | Wt 162.0 lb

## 2016-01-15 DIAGNOSIS — R6883 Chills (without fever): Secondary | ICD-10-CM

## 2016-01-15 DIAGNOSIS — R112 Nausea with vomiting, unspecified: Secondary | ICD-10-CM

## 2016-01-15 DIAGNOSIS — L500 Allergic urticaria: Secondary | ICD-10-CM

## 2016-01-15 DIAGNOSIS — K5900 Constipation, unspecified: Secondary | ICD-10-CM | POA: Insufficient documentation

## 2016-01-15 DIAGNOSIS — R21 Rash and other nonspecific skin eruption: Secondary | ICD-10-CM | POA: Insufficient documentation

## 2016-01-15 MED ORDER — ONDANSETRON 8 MG PO TBDP: 8.0000 mg | ORAL_TABLET | Freq: Three times a day (TID) | ORAL | 0 refills | Status: DC | PRN

## 2016-01-15 MED ORDER — HYDROXYZINE HCL 25 MG PO TABS: 25.0000 mg | ORAL_TABLET | Freq: Three times a day (TID) | ORAL | 3 refills | Status: DC | PRN

## 2016-01-15 NOTE — Assessment & Plan Note (Signed)
Patient reports ongoing daily episodes of diffuse urticarial rash with chills (approx once/day) of unclear etiology that have persisted since she was stung by a bee in mid-July. She was initially treated with steroid injection, benadryl, and ranitadine. She denies any face or tongue swelling since initial presentation (had lip swelling at that time). She has been using hydrocortisone cream and po Benadryl for symptomatic relief, and was prescribed hydroxyzine but was unable to fill this initially for insurance reasons, although this has since been approved by insurance. She denies any recurrent insect bites, stings, diet, medication changes, new soaps/detergents. She does take anastrozole hormonal therapy for her history of breast cancer - which does carry some risk for rash side effects. The only other potentially new medication is the H2 blocker (ranitidine initially, then famotidine), although rash is not a common side effect for this medication. ROS largely unremarkable, mentions some issues with constipation, subjective chills. No rash present on exam during visit.  Plan: - Provided prescription for Hydroxyzine 25 mg TID prn  - Check TSH, BMP today - Continue hydrocortisone 2.5% ointment, avoid face - Discontinue famotidine - Consider Allergy Immunology referral at future visit

## 2016-01-15 NOTE — Progress Notes (Addendum)
   CC: Ongoing urticarial rash, nausea and vomiting for 2-3 days  HPI:  [name] is a 70 y.o. female with PMHx detailed below presenting with ongoing daily episodes of diffuse urticarial rash since July and nausea for the past 2-3 days with one episode of vomiting.   See problem based assessment and plan below for additional details.  Past Medical History:  Diagnosis Date  . Allergy   . Anemia   . Breast CA ([place])    (Rt) breast ca dx 4/12---  S/P RADICAL MASTECTOMY AND CHEMORADIATION  . Diabetes mellitus ORAL MED  . Fatty liver disease, nonalcoholic   . History of hyperkalemia   . Hx antineoplastic chemo  10/12 - 06/24/11   PACLITAX EL COMPLETED 06/24/11  . Hyperlipidemia   . Hypertension   . Mild nonproliferative diabetic retinopathy of right eye ([place]) 03/19/11   Dr. Joseph Art  . Numbness and tingling    Hx: of in fingers and toes since chemotherapy  . OA (osteoarthritis) of knee    RIGHT LEG  . Obesity   . Renal lesion    Review of Systems: Review of Systems  Constitutional: Positive for chills. Negative for fever and weight loss.       Positive for hot flashes  Respiratory: Negative for cough and shortness of breath.   Cardiovascular: Negative for chest pain.  Gastrointestinal: Positive for constipation, diarrhea, nausea and vomiting. Negative for abdominal pain and heartburn.  Genitourinary: Negative for dysuria.  Skin: Positive for itching and rash.  Neurological: Negative for dizziness, focal weakness, weakness and headaches.    Physical Exam: Vitals:   01/15/16 1109  BP: 131/60  Pulse: 93  Temp: 98.2 F (36.8 C)  SpO2: 98%  Weight: 162 lb (73.5 kg)   GENERAL- Elderly woman sitting comfortably in exam room chair, alert, in no distress HEENT- Atraumatic, PERRL, EOMI, moist mucous membranes CARDIAC- Regular rate and rhythm, no murmurs, rubs or gallops. RESP- Clear to ascultation bilaterally, no wheezing or crackles, normal work of  breathing ABDOMEN- Normoactive bowel sounds, soft, nontender, nondistended EXTREMITIES- Normal bulk and range of motion, no edema, 2+ peripheral pulses SKIN- Warm, dry, intact, no visible rash at the time of exam [facility]- Appropriate affect, clear speech, thoughts linear and goal-directed  Assessment & Plan:   See encounters tab for problem based medical decision making.  Patient seen with Dr. Eppie Gibson

## 2016-01-15 NOTE — Patient Instructions (Addendum)
Please continue to take your medications as prescribed and check your blood sugar. Try to remain hydrated and eat when you can.  Today we filled a prescription for: - Zofran 8mg , 10 tablets, as needed for nausea, taking this before eating might help you keep down your meals - Hydroxyzine 25mg , up to three times daily as needed for itching  Please STOP taking famotidine, as this medication may be causing your rash.  Please return in 2-3 weeks for a follow up visit, or sooner if you begin to feel worse.  Thank you for visiting [place] today!

## 2016-01-15 NOTE — Assessment & Plan Note (Addendum)
Nausea present for 2-3 days, states that smelling food makes her nauseous, vomited once. Appetite remains good despite this, able to tolerate liquids and fruit. Denies abdominal pain, diarrhea, fever, chills. Has not taken her medications for 2-3 days due to decreased po tolerance.  Assessment: Nausea and vomiting of unclear etiology, possible viral gastroenteritis  Plan: - Prescribed short-course Zofran 8mg  ODT Q8h PRN for nausea - Check BMP - Encouraged good po intake/hydration - Instructed to call and/or return to clinic if symptoms worsen

## 2016-01-16 LAB — BMP8+ANION GAP
Anion Gap: 20 mmol/L — ABNORMAL HIGH (ref 10.0–18.0)
BUN/Creatinine Ratio: 19 (ref 12–28)
BUN: 18 mg/dL (ref 8–27)
CO2: 27 mmol/L (ref 18–29)
Calcium: 9.6 mg/dL (ref 8.7–10.3)
Chloride: 96 mmol/L (ref 96–106)
Creatinine, Ser: 0.95 mg/dL (ref 0.57–1.00)
GFR calc Af Amer: 71 mL/min/{1.73_m2} (ref >59)
GFR calc non Af Amer: 62 mL/min/{1.73_m2} (ref >59)
Glucose: 245 mg/dL — ABNORMAL HIGH (ref 65–99)
Potassium: 3.6 mmol/L (ref 3.5–5.2)
Sodium: 143 mmol/L (ref 134–144)

## 2016-01-16 LAB — TSH: TSH: 1.82 u[IU]/mL (ref 0.450–4.500)

## 2016-01-17 NOTE — Progress Notes (Signed)
I saw and evaluated the patient.  I personally confirmed the key portions of Dr. Johnson's history and exam and reviewed pertinent patient test results.  The assessment, diagnosis, and plan were formulated together and I agree with the documentation in the resident's note. 

## 2016-03-31 ENCOUNTER — Other Ambulatory Visit: Payer: Self-pay | Admitting: Internal Medicine

## 2016-03-31 DIAGNOSIS — I152 Hypertension secondary to endocrine disorders: Secondary | ICD-10-CM

## 2016-03-31 DIAGNOSIS — E1159 Type 2 diabetes mellitus with other circulatory complications: Secondary | ICD-10-CM

## 2016-03-31 DIAGNOSIS — I10 Essential (primary) hypertension: Principal | ICD-10-CM

## 2016-04-01 ENCOUNTER — Encounter: Payer: Self-pay | Admitting: *Deleted

## 2016-04-03 ENCOUNTER — Encounter: Payer: Medicare Other | Admitting: Internal Medicine

## 2016-04-09 ENCOUNTER — Ambulatory Visit (INDEPENDENT_AMBULATORY_CARE_PROVIDER_SITE_OTHER): Payer: Medicare Other | Admitting: *Deleted

## 2016-04-09 ENCOUNTER — Encounter (INDEPENDENT_AMBULATORY_CARE_PROVIDER_SITE_OTHER): Payer: Self-pay

## 2016-04-09 DIAGNOSIS — Z23 Encounter for immunization: Secondary | ICD-10-CM

## 2016-04-28 ENCOUNTER — Other Ambulatory Visit: Payer: Self-pay | Admitting: Internal Medicine

## 2016-05-02 ENCOUNTER — Other Ambulatory Visit: Payer: Self-pay | Admitting: Internal Medicine

## 2016-05-02 DIAGNOSIS — E1121 Type 2 diabetes mellitus with diabetic nephropathy: Secondary | ICD-10-CM

## 2016-05-02 DIAGNOSIS — E785 Hyperlipidemia, unspecified: Secondary | ICD-10-CM

## 2016-06-30 ENCOUNTER — Other Ambulatory Visit: Payer: Self-pay | Admitting: Internal Medicine

## 2016-06-30 DIAGNOSIS — I152 Hypertension secondary to endocrine disorders: Secondary | ICD-10-CM

## 2016-06-30 DIAGNOSIS — I10 Essential (primary) hypertension: Principal | ICD-10-CM

## 2016-06-30 DIAGNOSIS — E1159 Type 2 diabetes mellitus with other circulatory complications: Secondary | ICD-10-CM

## 2016-07-01 ENCOUNTER — Emergency Department (HOSPITAL_COMMUNITY): Payer: Medicare Other

## 2016-07-01 ENCOUNTER — Observation Stay (HOSPITAL_COMMUNITY): Payer: Medicare Other

## 2016-07-01 ENCOUNTER — Inpatient Hospital Stay (HOSPITAL_COMMUNITY)
Admission: EM | Admit: 2016-07-01 | Discharge: 2016-07-03 | DRG: 440 | Disposition: A | Discharge status: Home / Self Care | Payer: Medicare Other | Attending: Student in an Organized Health Care Education/Training Program | Admitting: Student in an Organized Health Care Education/Training Program

## 2016-07-01 ENCOUNTER — Encounter (HOSPITAL_COMMUNITY): Payer: Self-pay | Admitting: *Deleted

## 2016-07-01 DIAGNOSIS — K859 Acute pancreatitis without necrosis or infection, unspecified: Secondary | ICD-10-CM | POA: Diagnosis not present

## 2016-07-01 DIAGNOSIS — K760 Fatty (change of) liver, not elsewhere classified: Secondary | ICD-10-CM | POA: Diagnosis present

## 2016-07-01 DIAGNOSIS — E1129 Type 2 diabetes mellitus with other diabetic kidney complication: Secondary | ICD-10-CM | POA: Diagnosis present

## 2016-07-01 DIAGNOSIS — Z823 Family history of stroke: Secondary | ICD-10-CM

## 2016-07-01 DIAGNOSIS — E1169 Type 2 diabetes mellitus with other specified complication: Secondary | ICD-10-CM | POA: Diagnosis present

## 2016-07-01 DIAGNOSIS — Z79811 Long term (current) use of aromatase inhibitors: Secondary | ICD-10-CM | POA: Diagnosis not present

## 2016-07-01 DIAGNOSIS — Z9011 Acquired absence of right breast and nipple: Secondary | ICD-10-CM | POA: Diagnosis not present

## 2016-07-01 DIAGNOSIS — E785 Hyperlipidemia, unspecified: Secondary | ICD-10-CM | POA: Diagnosis not present

## 2016-07-01 DIAGNOSIS — I152 Hypertension secondary to endocrine disorders: Secondary | ICD-10-CM | POA: Diagnosis present

## 2016-07-01 DIAGNOSIS — R1084 Generalized abdominal pain: Secondary | ICD-10-CM | POA: Diagnosis not present

## 2016-07-01 DIAGNOSIS — E669 Obesity, unspecified: Secondary | ICD-10-CM | POA: Diagnosis present

## 2016-07-01 DIAGNOSIS — C50911 Malignant neoplasm of unspecified site of right female breast: Secondary | ICD-10-CM

## 2016-07-01 DIAGNOSIS — Z853 Personal history of malignant neoplasm of breast: Secondary | ICD-10-CM | POA: Diagnosis not present

## 2016-07-01 DIAGNOSIS — Z800 Family history of malignant neoplasm of digestive organs: Secondary | ICD-10-CM

## 2016-07-01 DIAGNOSIS — K5730 Diverticulosis of large intestine without perforation or abscess without bleeding: Secondary | ICD-10-CM | POA: Diagnosis not present

## 2016-07-01 DIAGNOSIS — I10 Essential (primary) hypertension: Secondary | ICD-10-CM | POA: Diagnosis not present

## 2016-07-01 DIAGNOSIS — E1159 Type 2 diabetes mellitus with other circulatory complications: Secondary | ICD-10-CM | POA: Diagnosis present

## 2016-07-01 DIAGNOSIS — M1710 Unilateral primary osteoarthritis, unspecified knee: Secondary | ICD-10-CM | POA: Diagnosis present

## 2016-07-01 DIAGNOSIS — Z8249 Family history of ischemic heart disease and other diseases of the circulatory system: Secondary | ICD-10-CM

## 2016-07-01 DIAGNOSIS — Z79899 Other long term (current) drug therapy: Secondary | ICD-10-CM

## 2016-07-01 DIAGNOSIS — Z7984 Long term (current) use of oral hypoglycemic drugs: Secondary | ICD-10-CM

## 2016-07-01 DIAGNOSIS — Z803 Family history of malignant neoplasm of breast: Secondary | ICD-10-CM

## 2016-07-01 DIAGNOSIS — E119 Type 2 diabetes mellitus without complications: Secondary | ICD-10-CM | POA: Diagnosis not present

## 2016-07-01 LAB — CBC
HCT: 32.2 % — ABNORMAL LOW (ref 36.0–46.0)
Hemoglobin: 10.5 g/dL — ABNORMAL LOW (ref 12.0–15.0)
MCH: 29.8 pg (ref 26.0–34.0)
MCHC: 32.6 g/dL (ref 30.0–36.0)
MCV: 91.5 fL (ref 78.0–100.0)
Platelets: 191 10*3/uL (ref 150–400)
RBC: 3.52 MIL/uL — ABNORMAL LOW (ref 3.87–5.11)
RDW: 16.5 % — ABNORMAL HIGH (ref 11.5–15.5)
WBC: 5.3 10*3/uL (ref 4.0–10.5)

## 2016-07-01 LAB — LIPID PANEL
Cholesterol: 114 mg/dL (ref 0–200)
HDL: 40 mg/dL — ABNORMAL LOW (ref >40)
LDL Cholesterol: 43 mg/dL (ref 0–99)
Total CHOL/HDL Ratio: 2.9 RATIO
Triglycerides: 155 mg/dL — ABNORMAL HIGH (ref <150)
VLDL: 31 mg/dL (ref 0–40)

## 2016-07-01 LAB — COMPREHENSIVE METABOLIC PANEL
ALT: 157 U/L — ABNORMAL HIGH (ref 14–54)
AST: 178 U/L — ABNORMAL HIGH (ref 15–41)
Albumin: 4.1 g/dL (ref 3.5–5.0)
Alkaline Phosphatase: 72 U/L (ref 38–126)
Anion gap: 12 (ref 5–15)
BUN: 14 mg/dL (ref 6–20)
CO2: 29 mmol/L (ref 22–32)
Calcium: 9.8 mg/dL (ref 8.9–10.3)
Chloride: 100 mmol/L — ABNORMAL LOW (ref 101–111)
Creatinine, Ser: 1.1 mg/dL — ABNORMAL HIGH (ref 0.44–1.00)
GFR calc Af Amer: 58 mL/min — ABNORMAL LOW (ref >60)
GFR calc non Af Amer: 50 mL/min — ABNORMAL LOW (ref >60)
Glucose, Bld: 138 mg/dL — ABNORMAL HIGH (ref 65–99)
Potassium: 3.6 mmol/L (ref 3.5–5.1)
Sodium: 141 mmol/L (ref 135–145)
Total Bilirubin: 0.9 mg/dL (ref 0.3–1.2)
Total Protein: 6.3 g/dL — ABNORMAL LOW (ref 6.5–8.1)

## 2016-07-01 LAB — URINALYSIS, ROUTINE W REFLEX MICROSCOPIC
Bilirubin Urine: NEGATIVE
Glucose, UA: NEGATIVE mg/dL
Hgb urine dipstick: NEGATIVE
Ketones, ur: NEGATIVE mg/dL
Nitrite: NEGATIVE
Protein, ur: 30 mg/dL — AB
Specific Gravity, Urine: 1.01 (ref 1.005–1.030)
pH: 6 (ref 5.0–8.0)

## 2016-07-01 LAB — GLUCOSE, CAPILLARY
Glucose-Capillary: 65 mg/dL (ref 65–99)
Glucose-Capillary: 113 mg/dL — ABNORMAL HIGH (ref 65–99)
Glucose-Capillary: 88 mg/dL (ref 65–99)
Glucose-Capillary: 89 mg/dL (ref 65–99)

## 2016-07-01 LAB — I-STAT TROPONIN, ED: Troponin i, poc: 0 ng/mL (ref 0.00–0.08)

## 2016-07-01 LAB — LIPASE, BLOOD: Lipase: 973 U/L — ABNORMAL HIGH (ref 11–51)

## 2016-07-01 MED ORDER — HYOSCYAMINE SULFATE 0.5 MG/ML IJ SOLN
0.1250 mg | INTRAMUSCULAR | Status: DC | PRN
  Filled 2016-07-01: qty 0.25

## 2016-07-01 MED ORDER — INSULIN ASPART 100 UNIT/ML ~~LOC~~ SOLN
0.0000 [IU] | SUBCUTANEOUS | Status: DC
Start: 2016-07-01 — End: 2016-07-03
  Administered 2016-07-02 (×2): 1 [IU] via SUBCUTANEOUS
  Administered 2016-07-02 – 2016-07-03 (×3): 2 [IU] via SUBCUTANEOUS

## 2016-07-01 MED ORDER — SODIUM CHLORIDE 0.9 % IV SOLN
INTRAVENOUS | Status: AC
  Administered 2016-07-01 (×2): via INTRAVENOUS

## 2016-07-01 MED ORDER — ANASTROZOLE 1 MG PO TABS
1.0000 mg | ORAL_TABLET | Freq: Every day | ORAL | Status: DC
  Administered 2016-07-01 – 2016-07-03 (×2): 1 mg via ORAL
  Filled 2016-07-01 (×3): qty 1

## 2016-07-01 MED ORDER — IOPAMIDOL (ISOVUE-300) INJECTION 61%
INTRAVENOUS | Status: AC
  Administered 2016-07-01: 100 mL
  Filled 2016-07-01: qty 100

## 2016-07-01 MED ORDER — HYOSCYAMINE SULFATE 0.5 MG/ML IJ SOLN
0.1250 mg | Freq: Once | INTRAMUSCULAR | Status: AC
  Administered 2016-07-01: 0.125 mg via INTRAVENOUS
  Filled 2016-07-01: qty 0.25

## 2016-07-01 MED ORDER — ENOXAPARIN SODIUM 40 MG/0.4ML ~~LOC~~ SOLN
40.0000 mg | SUBCUTANEOUS | Status: DC
  Administered 2016-07-01 – 2016-07-02 (×2): 40 mg via SUBCUTANEOUS
  Filled 2016-07-01 (×2): qty 0.4

## 2016-07-01 MED ORDER — DEXTROSE 50 % IV SOLN
INTRAVENOUS | Status: AC
  Administered 2016-07-01: 25 mL
  Filled 2016-07-01: qty 50

## 2016-07-01 MED ORDER — HYDROMORPHONE HCL 2 MG/ML IJ SOLN
0.5000 mg | Freq: Four times a day (QID) | INTRAMUSCULAR | Status: DC | PRN
  Administered 2016-07-01: 0.5 mg via INTRAVENOUS
  Filled 2016-07-01 (×3): qty 1

## 2016-07-01 MED ORDER — GABAPENTIN 100 MG PO CAPS
100.0000 mg | ORAL_CAPSULE | Freq: Three times a day (TID) | ORAL | Status: DC
  Administered 2016-07-01 – 2016-07-03 (×5): 100 mg via ORAL
  Filled 2016-07-01 (×7): qty 1

## 2016-07-01 MED ORDER — LISINOPRIL 40 MG PO TABS: 40.0000 mg | ORAL_TABLET | Freq: Every day | ORAL | Status: DC

## 2016-07-01 MED ORDER — HYDROCHLOROTHIAZIDE 25 MG PO TABS: 25.0000 mg | ORAL_TABLET | Freq: Every day | ORAL | Status: DC

## 2016-07-01 NOTE — Care Management Obs Status (Signed)
[place] NOTIFICATION   Patient Details  Name: [name] MRN: [id number] Date of Birth: 03/27/1946   Medicare Observation Status Notification Given:  Yes    Carles Collet, RN 07/01/2016, 4:48 PM

## 2016-07-01 NOTE — Progress Notes (Addendum)
[name] [id number] Admission Data: 07/01/2016 5:43 PM Attending Provider: Aldine Contes, MD  [id number] Jillian Na, MD Consults/ Treatment Team:   [name] is a 71 y.o. female patient admitted from ED awake, alert  & orientated  X 3,  Full Code, VSS - Blood pressure (!) 139/58, pulse 83, temperature 98.7 F (37.1 C), temperature source Oral, resp. rate (!) 22, height 4\' 11"  (1.499 m), weight 73.8 kg (162 lb 11.2 oz), SpO2 99 %.,no c/o shortness of breath, no c/o chest pain, no distress noted. Tele # 34 placed and pt is currently running:normal sinus rhythm.   IV site WDL:  antecubital left, condition patent and no redness with a transparent dsg that's clean dry and intact.  Allergies:   Allergies  Allergen Reactions  . Metformin Other (See Comments)    REACTION, diarrhea: 850 mg - diarrhea but tolerates 500 mg daily     Past Medical History:  Diagnosis Date  . Allergy   . Anemia   . Breast CA ([place])    (Rt) breast ca dx 4/12---  S/P RADICAL MASTECTOMY AND CHEMORADIATION  . Diabetes mellitus ORAL MED  . Fatty liver disease, nonalcoholic   . History of hyperkalemia   . Hx antineoplastic chemo  10/12 - 06/24/11   PACLITAX EL COMPLETED 06/24/11  . Hyperlipidemia   . Hypertension   . Mild nonproliferative diabetic retinopathy of right eye ([place]) 03/19/11   Dr. Joseph Art  . Numbness and tingling    Hx: of in fingers and toes since chemotherapy  . OA (osteoarthritis) of knee    RIGHT LEG  . Obesity   . Renal lesion     History:  obtained from chart review. Tobacco/alcohol: denied none  Pt orientation to unit, room and routine. Information packet given to patient/family and safety video watched.  Admission INP armband ID verified with patient/family, and in place. SR up x 2, fall risk assessment complete with Patient and family verbalizing understanding of risks associated with falls. Pt verbalizes an understanding of how to use the call bell and to call for help  before getting out of bed.  Skin, clean-dry- intact without evidence of bruising, or skin tears.   No evidence of skin break down noted on exam. Sporadic skin tags throughout and scar on Right knee from previous knee replacement. Left arm restriction r/t left mastectomy.     Will cont to monitor and assist as needed.  Salley Slaughter, RN 07/01/2016 5:43 PM

## 2016-07-01 NOTE — H&P (Signed)
Date: 07/01/2016               Patient Name:  [name] MRN: [id number]  DOB: 1946/01/04 Age / Sex: 71 y.o., female   PCP: Holley Raring, MD         Medical Service: Internal Medicine Teaching Service         Attending Physician: Dr. Aldine Contes, MD    First Contact: Dr. Lovena Le Pager: [phone]  Second Contact: Dr. Posey Pronto Pager: [phone]       After Hours (After 5p/  First Contact Pager: [phone]  weekends / holidays): Second Contact Pager: [phone]   Chief Complaint: abd pain  History of Present Illness:   71 yo f with hx of Breast cancer s/p radical mastectomy on Anastrozole,  fatty liver disease, HTN, HLD, DM II, who presents with abdominal pain. Her abd pain started suddenly around 6:30 PM yesterday while she was in class (doing bachelor's in theology), it was across her upper abdomen without radiation, 8/10 sharp pain. No improvement with bowel movement, she had to leave class due to the pain, went home, improved to 4/10 with time, was able to fall asleep even with the pain but woke up around 3 AM with worsening pain around 8/10 and came to to the hospital. Pain improvement with hycosamine in ED. Had some nausea yesterday but no vomitting, no diarrhea, fever, chills, SOB, cough, chest pain, or any other symptoms. No recent travel, no unusual food, no sick contacts. No recent changes in medications, no bug or scorpion bite, no hx of gallstones, no etoh use.   abd xray was unremarkable.  abd u/s showed Normal gallbladder.  Negative for cholelithiasis. Chronic fatty liver disease. Two small liver lesions are visible by ultrasound today and likely correspond to some of those seen by CT in 2008. Favor benign etiology such as fatty sparing or hemangioma.  CT abd showed Scattered colonic diverticula. Lipase 973. AST 178, ALT 157.    Meds:  Current Meds  Medication Sig  . anastrozole (ARIMIDEX) 1 MG tablet Take 1 tablet (1 mg total) by mouth daily.  Marland Kitchen atorvastatin (LIPITOR)  40 MG tablet TAKE 1 TABLET(40 MG) BY MOUTH AT BEDTIME  . gabapentin (NEURONTIN) 100 MG capsule TAKE ONE CAPSULE BY MOUTH THREE TIMES DAILY (Patient taking differently: TAKE ONE CAPSULE BY MOUTH THREE TIMES DAILY AS NEEDED FOR PAIN)  . glipiZIDE (GLUCOTROL) 5 MG tablet TAKE 1 TABLET(5 MG) BY MOUTH TWICE DAILY BEFORE A MEAL  . hydrochlorothiazide (HYDRODIURIL) 25 MG tablet TAKE 1 TABLET BY MOUTH EVERY DAY  . hydrOXYzine (ATARAX/VISTARIL) 25 MG tablet Take 1 tablet (25 mg total) by mouth 3 (three) times daily as needed.  Marland Kitchen lisinopril (PRINIVIL,ZESTRIL) 40 MG tablet TAKE 1 TABLET BY MOUTH DAILY  . metFORMIN (GLUCOPHAGE) 500 MG tablet TAKE 1 TABLET(500 MG) BY MOUTH TWICE DAILY WITH A MEAL  . Polyvinyl Alcohol-Povidone (REFRESH OP) Place 2 drops into both eyes as needed (for dry eyes).     Allergies: Allergies as of 07/01/2016 - Review Complete 07/01/2016  Allergen Reaction Noted  . Metformin Other (See Comments) 07/09/2006   Past Medical History:  Diagnosis Date  . Allergy   . Anemia   . Breast CA ([place])    (Rt) breast ca dx 4/12---  S/P RADICAL MASTECTOMY AND CHEMORADIATION  . Diabetes mellitus ORAL MED  . Fatty liver disease, nonalcoholic   . History of hyperkalemia   . Hx antineoplastic chemo  10/12 - 06/24/11   PACLITAX  EL COMPLETED 06/24/11  . Hyperlipidemia   . Hypertension   . Mild nonproliferative diabetic retinopathy of right eye ([place]) 03/19/11   Dr. Joseph Art  . Numbness and tingling    Hx: of in fingers and toes since chemotherapy  . OA (osteoarthritis) of knee    RIGHT LEG  . Obesity   . Renal lesion     Family History:  Family History  Problem Relation Age of Onset  . Stroke Father   . Heart disease Sister   . Cancer Sister     breast  . Heart disease Brother   . Cancer Sister     COLON CANCER     Social History:  Social History   Social History  . Marital status: Married    Spouse name: N/A  . Number of children: N/A  . Years of education: 61    Social History Main Topics  . Smoking status: Never Smoker  . Smokeless tobacco: Never Used  . Alcohol use No  . Drug use: No  . Sexual activity: Not Asked     Comment: MENARCHE AGE 5, G4, P4, PARITY AGE 37, HRT 20+ YEARS     Other Topics Concern  . 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  Patient needs to submit further paperwork to complete- Bonna Gains 08/14/09.   Financial assistance application completed.  Patient does not qualify for [place] 09/12/09.              Review of Systems: A complete ROS was negative except as per HPI.   Physical Exam: Blood pressure 128/73, pulse 98, temperature 98.5 F (36.9 C), resp. rate 22, SpO2 97 %.  Physical Exam  Constitutional: [name], place, and time. She appears well-developed and well-nourished. No distress.  HENT:  Head: Normocephalic and atraumatic.  Dry oral mucosa  Eyes: Conjunctivae are normal. Pupils are equal, round, and reactive to light. Right eye exhibits no discharge. Left eye exhibits no discharge.  Neck: Normal range of motion. No JVD present.  Cardiovascular: Normal rate and regular rhythm.  Exam reveals no gallop and no friction rub.   No murmur heard. Respiratory: Effort normal and breath sounds normal. No respiratory distress. She has no wheezes.  GI: Soft. Bowel sounds are normal. She exhibits no distension. There is no tenderness.  Musculoskeletal: Normal range of motion. She exhibits no edema, tenderness or deformity.  Neurological: She is alert and oriented to person, place, and time. No cranial nerve deficit. Coordination normal.  Skin: She is not diaphoretic.  Psychiatric: She has a normal mood and affect.     EKG: NSR, no ST changes. TWI III, V1.    Assessment & Plan by Problem: Principal  Problem:   Pancreatitis Active Problems:   Type 2 diabetes mellitus with renal manifestations (HCC)   Hypertension associated with diabetes ([place])   Fatty liver   71 yo f with DM II, HTN, and other medical problems here with abd pain found to have pancreatitis  Pancreatitis - overall seems mild Lipase 973, AST and ALT also elevated. Unclear etiology, no new meds. Is on thiazide and aceI which sometimes can cause pancreatitis. Also on anastrozole for her breast cancer which could also cause it.  No gallstones, no etoh use. Has  fatty liver disease, so could be due to lipid disorder? - will hold lisinopril and hctz - IVF, NPO, bowel rest, pain control - trend CMET - check lipid panel - may benefit from endoscopic ultrasound (EUS) to look for pancreatic ductal abnormalities, small tumors at or near the ampulla, or microlithiasis in the gallbladder or bile duct.  DM II  Hold metformin and glipizide. Will do SSI here  HTN -hold hctz and lisinopril for now.    Dispo: Admit patient to Observation with expected length of stay less than 2 midnights.  Signed: Dellia Nims, MD 07/01/2016, 7:12 AM  Pager: [phone]

## 2016-07-01 NOTE — Progress Notes (Signed)
   Subjective: No acute events overnight. Patient was not having any pain when evaluated on rounds. She denied nausea or vomiting. She denied diarrhea. She has not been bitten by a scorpion recently. She says that she is feeling much better.  Objective:  Vital signs in last 24 hours: Vitals:   07/01/16 0500 07/01/16 0600 07/01/16 0829 07/01/16 0831  BP: 116/81 128/73 (!) 114/55   Pulse: 113 98 83   Resp: 17 22 16    Temp:   98.3 F (36.8 C)   TempSrc:   Oral   SpO2: 98% 97% 100%   Weight:    162 lb 11.2 oz (73.8 kg)  Height:    4\' 11"  (1.499 m)   Physical Exam  Constitutional: [name], place, and time. She appears well-developed and well-nourished.  Cardiovascular: Normal rate and regular rhythm.   No murmur heard. Respiratory: Effort normal and breath sounds normal. No respiratory distress. She has no wheezes.  GI: Soft. Bowel sounds are normal. She exhibits no distension. There is no tenderness.  Musculoskeletal: She exhibits no edema.  Neurological: She is alert and oriented to person, place, and time.     Assessment/Plan:  Principal Problem:   Pancreatitis Active Problems:   Type 2 diabetes mellitus with renal manifestations (HCC)   Hypertension associated with diabetes ([place])   Fatty liver  71 yo f with DM II, HTN, and other medical problems here with abd pain found to have pancreatitis  # Pancreatitis - mild Lipase 973, AST and ALT also elevated. Unclear etiology, no new meds. Is on thiazide and ACEi which sometimes can cause pancreatitis. Also on anastrozole for her breast cancer which could also cause it.  No gallstones, no etoh use. Has fatty liver disease. Triglycerides are not elevated high enough to cause pancreatitis. The exact etiology is unclear at this time. Continue to treat symptomatically. -- will hold lisinopril and HCTZ- for hypertension could start calcium channel blocker with amlodipine - -IVF, NPO, bowel rest -- Hydromorphone 0.5 mg  every 6 hours when necessary pain - trend CMET - may benefit from endoscopic ultrasound (EUS) to look for pancreatic ductal abnormalities, small tumors at or near the ampulla, or microlithiasis in the gallbladder or bile duct.  # DM II  Hold metformin and glipizide. Will do SSI here  # HTN  Hold current antihypertensives. Patient becomes hypertensive we'll consider starting a calcium channel blocker    Dispo: Anticipated discharge tomorrow.    Ophelia Shoulder, MD 07/01/2016, 2:09 PM Pager: [phone]

## 2016-07-01 NOTE — Care Management Note (Signed)
Case Management Note  Patient Details  Name: [name] MRN: [id number] Date of Birth: 1945-06-13  Subjective/Objective:      Pancreatitis, DM, HTN              Action/Plan: Discharge Planning: NCM spoke to pt and lives at home with husband. She was independent prior to hospital stay. Will continue to follow for dc needs.   PCP Holley Raring   Expected Discharge Date:              Expected Discharge Plan:  Home/Self Care  In-House Referral:  NA  Discharge planning Services  CM Consult  Post Acute Care Choice:  NA Choice offered to:  NA  DME Arranged:  N/A DME Agency:  NA  HH Arranged:  NA HH Agency:  NA  Status of Service:  In process, will continue to follow  If discussed at Long Length of Stay Meetings, dates discussed:    Additional Comments:  Erenest Rasher, RN 07/01/2016, 2:20 PM

## 2016-07-01 NOTE — ED Notes (Signed)
Attempted report 

## 2016-07-01 NOTE — ED Notes (Signed)
ED Provider at bedside. 

## 2016-07-01 NOTE — Progress Notes (Addendum)
CRITICAL VALUE ALERT  Critical value received:  CBG 65  Date of notification:  07/01/16  Time of notification:  13:10  Critical value read back:No.  Nurse who received alert:  Rosalio Loud RN  MD notified (1st page):  Family Medicine  Time of first page:  13:11  Responding MD:  Family Medicine Dr. Lovena Le  Time MD responded:  13:16  Gave 67mls D50 per protocol. Will recheck CBG in 15 min  CBG at 15 min 113

## 2016-07-01 NOTE — ED Triage Notes (Signed)
Pt was woken from sleep this morning feeling like her heart was racing. Pt also reports having lower generalized abdominal pain onset yesterday with nausea.

## 2016-07-01 NOTE — ED Notes (Signed)
CT made aware that pt has IV.

## 2016-07-01 NOTE — ED Provider Notes (Signed)
[facility] DEPT Provider Note   CSN: [id number] Arrival date & time: 07/01/16  0309     History   Chief Complaint Chief Complaint  Patient presents with  . Tachycardia  . Abdominal Pain    HPI [name] is a 71 y.o. female.  Patient with complaint of abdominal pain and fast heart rate that started last night around 7:00 pm. He reports sitting at her computer studying for class when she felt her heart rate increase, no pain or SOB, and started having central abdominal pain. No nausea or vomiting. Episode lasted about 20 minutes then resolved. She states she had some residual abdominal pain that was persistent but all other symptoms resolved until around 2:00 am when she was woken up with increased pain and high heart rate again. No similar symptoms in the past. No back pain or fever.   The history is provided by the patient. No language interpreter was used.  Abdominal Pain   Associated symptoms include nausea. Pertinent negatives include fever and vomiting.    Past Medical History:  Diagnosis Date  . Allergy   . Anemia   . Breast CA ([place])    (Rt) breast ca dx 4/12---  S/P RADICAL MASTECTOMY AND CHEMORADIATION  . Diabetes mellitus ORAL MED  . Fatty liver disease, nonalcoholic   . History of hyperkalemia   . Hx antineoplastic chemo  10/12 - 06/24/11   PACLITAX EL COMPLETED 06/24/11  . Hyperlipidemia   . Hypertension   . Mild nonproliferative diabetic retinopathy of right eye ([place]) 03/19/11   Dr. Joseph Art  . Numbness and tingling    Hx: of in fingers and toes since chemotherapy  . OA (osteoarthritis) of knee    RIGHT LEG  . Obesity   . Renal lesion     Patient Active Problem List   Diagnosis Date Noted  . Nausea with vomiting 01/15/2016  . Constipation 01/15/2016  . Allergic urticaria 12/21/2015  . Poor appetite 11/27/2015  . Ganglion cyst 07/12/2015  . Piriformis syndrome of right side 03/02/2014  . Osteopenia 12/13/2013  . Vaginal dryness  12/13/2013  . Peripheral neuropathy ([place]) 11/02/2013  . Bilateral hand numbness 09/02/2013  . Health care maintenance 11/17/2012  . Infection of prosthetic knee joint ([place]) 09/03/2012  . Arthritis of knee, right 08/16/2012  . Breast cancer, right breast ([place]) 09/25/2010  . COLONIC POLYPS, HX OF 07/19/2007  . OBESITY NOS 07/09/2006  . Type 2 diabetes mellitus with renal manifestations ([place]) 03/10/2006  . Hyperlipidemia 03/10/2006  . Hypertension associated with diabetes ([place]) 03/10/2006  . ALLERGIC RHINITIS 03/10/2006  . FATTY LIVER DISEASE 03/10/2006    Past Surgical History:  Procedure Laterality Date  . COLONOSCOPY W/ POLYPECTOMY     Hx; of  . I&D KNEE WITH POLY EXCHANGE Right 09/03/2012   Procedure: IRRIGATION AND DEBRIDEMENT RIGHT KNEE WITH POLY EXCHANGE;  Surgeon: Kerin Salen, MD;  Location: [facility];  Service: Orthopedics;  Laterality: Right;  . knee arthroscopy  10-28-1999   RIGHT  . Laparoscopy with laparoscopic right salpingo oophorectomy and lysis of pelvic and abdominal adhesions  March 2013   Dr. Nori Riis   . MASTECTOMY MODIFIED RADICAL  12-04-2010   W/ LEFT PAC PLACEMENT (RIGHT BREAST W/ AXILLARY CONTENTS/ NODE BX'S  . PORT-A-CATH REMOVAL  09/24/2011   Procedure: REMOVAL PORT-A-CATH;  Surgeon: Joyice Faster. Cornett, MD;  Location: [facility];  Service: General;  Laterality: N/A;  . PORTACATH PLACEMENT     REPLACED PAC DUE TO MALFUNCTION (LEFT)  .  TOTAL KNEE ARTHROPLASTY Right 08/16/2012   Procedure: TOTAL KNEE ARTHROPLASTY;  Surgeon: Kerin Salen, MD;  Location: [facility];  Service: Orthopedics;  Laterality: Right;  . TRANSTHORACIC ECHOCARDIOGRAM  10-29-2010   NORMAL LVF/ EF 55-60%/ MILD MV REGURG  . TUBAL LIGATION  YRS AGO  . VAGINAL HYSTERECTOMY  AGE 89   W/ LSO    OB History    Gravida Para Term Preterm AB Living   4 4 4     4    SAB TAB Ectopic Multiple Live Births                   Home Medications    Prior to Admission medications   Medication Sig Start Date End  Date Taking? Authorizing Provider  anastrozole (ARIMIDEX) 1 MG tablet Take 1 tablet (1 mg total) by mouth daily. 01/04/16  Yes Chauncey Cruel, MD  atorvastatin (LIPITOR) 40 MG tablet TAKE 1 TABLET(40 MG) BY MOUTH AT BEDTIME 05/06/16  Yes Axel Filler, MD  gabapentin (NEURONTIN) 100 MG capsule TAKE ONE CAPSULE BY MOUTH THREE TIMES DAILY Patient taking differently: TAKE ONE CAPSULE BY MOUTH THREE TIMES DAILY AS NEEDED FOR PAIN 08/13/15  Yes Chauncey Cruel, MD  glipiZIDE (GLUCOTROL) 5 MG tablet TAKE 1 TABLET(5 MG) BY MOUTH TWICE DAILY BEFORE A MEAL 05/06/16  Yes Axel Filler, MD  hydrochlorothiazide (HYDRODIURIL) 25 MG tablet TAKE 1 TABLET BY MOUTH EVERY DAY 04/02/16  Yes Holley Raring, MD  hydrOXYzine (ATARAX/VISTARIL) 25 MG tablet Take 1 tablet (25 mg total) by mouth 3 (three) times daily as needed. 01/15/16  Yes Asencion Partridge, MD  lisinopril (PRINIVIL,ZESTRIL) 40 MG tablet TAKE 1 TABLET BY MOUTH DAILY 04/02/16  Yes Holley Raring, MD  metFORMIN (GLUCOPHAGE) 500 MG tablet TAKE 1 TABLET(500 MG) BY MOUTH TWICE DAILY WITH A MEAL 05/06/16  Yes Axel Filler, MD  Polyvinyl Alcohol-Povidone (REFRESH OP) Place 2 drops into both eyes as needed (for dry eyes).   Yes Historical Provider, MD  hydrocortisone 2.5 % cream Apply topically 2 (two) times daily. Patient not taking: Reported on 07/01/2016 12/21/15   Alexa Angela Burke, MD  ondansetron (ZOFRAN ODT) 8 MG disintegrating tablet Take 1 tablet (8 mg total) by mouth every 8 (eight) hours as needed for nausea or vomiting. 01/15/16   Asencion Partridge, MD    Family History Family History  Problem Relation Age of Onset  . Stroke Father   . Heart disease Sister   . Cancer Sister     breast  . Heart disease Brother   . Cancer Sister     COLON CANCER    Social History Social History  Substance Use Topics  . Smoking status: Never Smoker  . Smokeless tobacco: Never Used  . Alcohol use No     Allergies   Metformin   Review of  Systems Review of Systems  Constitutional: Negative for chills and fever.  HENT: Negative.   Respiratory: Negative.  Negative for shortness of breath.   Cardiovascular: Negative.  Negative for chest pain.  Gastrointestinal: Positive for abdominal pain and nausea. Negative for vomiting.  Musculoskeletal: Negative.  Negative for back pain.  Skin: Negative.   Neurological: Negative.      Physical Exam Updated Vital Signs BP 139/74   Pulse 73   Temp 98.5 F (36.9 C)   Resp 17   SpO2 99%   Physical Exam  Constitutional: [name], place, and time. She appears well-developed and well-nourished.  HENT:  Head: Normocephalic.  Neck: Normal range of motion. Neck supple.  Cardiovascular: Normal rate and regular rhythm.   No murmur heard. Pulmonary/Chest: Effort normal and breath sounds normal. She has no wheezes. She has no rales.  Abdominal: Soft. Bowel sounds are normal. There is tenderness (Tender across mid-abdomen.). There is no rebound and no guarding.  Musculoskeletal: Normal range of motion. She exhibits no edema.  Neurological: She is alert and oriented to person, place, and time.  Skin: Skin is warm and dry. No rash noted.  Psychiatric: She has a normal mood and affect.     ED Treatments / Results  Labs (all labs ordered are listed, but only abnormal results are displayed) Labs Reviewed  COMPREHENSIVE METABOLIC PANEL - Abnormal; Notable for the following:       Result Value   Chloride 100 (*)    Glucose, Bld 138 (*)    Creatinine, Ser 1.10 (*)    Total Protein 6.3 (*)    AST 178 (*)    ALT 157 (*)    GFR calc non Af Amer 50 (*)    GFR calc Af Amer 58 (*)    All other components within normal limits  CBC - Abnormal; Notable for the following:    RBC 3.52 (*)    Hemoglobin 10.5 (*)    HCT 32.2 (*)    RDW 16.5 (*)    All other components within normal limits  URINALYSIS, ROUTINE W REFLEX MICROSCOPIC  LIPASE, BLOOD  I-STAT TROPOININ, ED    Results for orders placed or performed during the hospital encounter of 07/01/16  Comprehensive metabolic panel  Result Value Ref Range   Sodium 141 135 - 145 mmol/L   Potassium 3.6 3.5 - 5.1 mmol/L   Chloride 100 (L) 101 - 111 mmol/L   CO2 29 22 - 32 mmol/L   Glucose, Bld 138 (H) 65 - 99 mg/dL   BUN 14 6 - 20 mg/dL   Creatinine, Ser 1.10 (H) 0.44 - 1.00 mg/dL   Calcium 9.8 8.9 - 10.3 mg/dL   Total Protein 6.3 (L) 6.5 - 8.1 g/dL   Albumin 4.1 3.5 - 5.0 g/dL   AST 178 (H) 15 - 41 U/L   ALT 157 (H) 14 - 54 U/L   Alkaline Phosphatase 72 38 - 126 U/L   Total Bilirubin 0.9 0.3 - 1.2 mg/dL   GFR calc non Af Amer 50 (L) >60 mL/min   GFR calc Af Amer 58 (L) >60 mL/min   Anion gap 12 5 - 15  CBC  Result Value Ref Range   WBC 5.3 4.0 - 10.5 K/uL   RBC 3.52 (L) 3.87 - 5.11 MIL/uL   Hemoglobin 10.5 (L) 12.0 - 15.0 g/dL   HCT 32.2 (L) 36.0 - 46.0 %   MCV 91.5 78.0 - 100.0 fL   MCH 29.8 26.0 - 34.0 pg   MCHC 32.6 30.0 - 36.0 g/dL   RDW 16.5 (H) 11.5 - 15.5 %   Platelets 191 150 - 400 K/uL  Urinalysis, Routine w reflex microscopic  Result Value Ref Range   Color, Urine YELLOW YELLOW   APPearance CLEAR CLEAR   Specific Gravity, Urine 1.010 1.005 - 1.030   pH 6.0 5.0 - 8.0   Glucose, UA NEGATIVE NEGATIVE mg/dL   Hgb urine dipstick NEGATIVE NEGATIVE   Bilirubin Urine NEGATIVE NEGATIVE   Ketones, ur NEGATIVE NEGATIVE mg/dL   Protein, ur 30 (A) NEGATIVE mg/dL   Nitrite NEGATIVE NEGATIVE   Leukocytes, UA  TRACE (A) NEGATIVE   RBC / HPF 0-5 0 - 5 RBC/hpf   WBC, UA 0-5 0 - 5 WBC/hpf   Bacteria, UA RARE (A) NONE SEEN   Squamous Epithelial / LPF 0-5 (A) NONE SEEN  Lipase, blood  Result Value Ref Range   Lipase 973 (H) 11 - 51 U/L  I-stat troponin, ED  Result Value Ref Range   Troponin i, poc 0.00 0.00 - 0.08 ng/mL   Comment 3             EKG  EKG Interpretation  Date/Time:  Tuesday July 01 2016 03:31:17 EST Ventricular Rate:  70 PR Interval:    QRS Duration: 91 QT  Interval:  441 QTC Calculation: 476 R Axis:   -4 Text Interpretation:  Sinus rhythm Low voltage, precordial leads Baseline wander in lead(s) V1 V2 stable TWI III Confirmed by Wyvonnia Dusky  MD, STEPHEN [phone]) on 07/01/2016 4:10:27 AM       Radiology Dg Abdomen Acute W/chest  Result Date: 07/01/2016 CLINICAL DATA:  Acute onset of lower generalized abdominal pain and nausea. Lightheadedness. Initial encounter. EXAM: DG ABDOMEN ACUTE W/ 1V CHEST COMPARISON:  Chest radiograph performed 11/21/2015 FINDINGS: The lungs are well-aerated and clear. There is no evidence of focal opacification, pleural effusion or pneumothorax. The cardiomediastinal silhouette is borderline normal in size. The visualized bowel gas pattern is unremarkable. Scattered stool and air are seen within the colon; there is no evidence of small bowel dilatation to suggest obstruction. No free intra-abdominal air is identified on the provided upright view. No acute osseous abnormalities are seen; the sacroiliac joints are unremarkable in appearance. Scattered clips are noted overlying the right axilla. IMPRESSION: 1. Unremarkable bowel gas pattern; no free intra-abdominal air seen. Small to moderate amount of stool noted in the colon. 2. Minimal left basilar atelectasis noted.  Lungs otherwise clear. Electronically Signed   By: Garald Balding M.D.   On: 07/01/2016 04:29    Procedures Procedures (including critical care time)  Medications Ordered in ED Medications  hyoscyamine (LEVSIN) 0.5 MG/ML injection 0.125 mg (0.125 mg Intravenous Given 07/01/16 0436)     Initial Impression / Assessment and Plan / ED Course  I have reviewed the triage vital signs and the nursing notes.  Pertinent labs & imaging results that were available during my care of the patient were reviewed by me and considered in my medical decision making (see chart for details).     Patient presents with mid-abdominal pain, palpitations that has been intermittent  starting around 7:00 pm last night (06/30/16). She is found to have an elevated lipase and liver enzymes with normal total bilirubin, indicating pancreatitis with gall stone pancreatitis not ruled out.   Pain is controlled here with levsin x 1 dose. No acute distress. VSS. Will call [facility] for admission. CT abd/pel and [place] RUQ pending.   Final Clinical Impressions(s) / ED Diagnoses   Final diagnoses:  None   1. Pancreatitis   New Prescriptions New Prescriptions   No medications on file     Charlann Lange, Hershal Coria 07/01/16 [place], MD 07/01/16 [phone]

## 2016-07-02 DIAGNOSIS — K853 Drug induced acute pancreatitis without necrosis or infection: Secondary | ICD-10-CM | POA: Diagnosis not present

## 2016-07-02 DIAGNOSIS — Z823 Family history of stroke: Secondary | ICD-10-CM | POA: Diagnosis not present

## 2016-07-02 DIAGNOSIS — E1129 Type 2 diabetes mellitus with other diabetic kidney complication: Secondary | ICD-10-CM | POA: Diagnosis present

## 2016-07-02 DIAGNOSIS — E119 Type 2 diabetes mellitus without complications: Secondary | ICD-10-CM | POA: Diagnosis not present

## 2016-07-02 DIAGNOSIS — I10 Essential (primary) hypertension: Secondary | ICD-10-CM | POA: Diagnosis not present

## 2016-07-02 DIAGNOSIS — Z8249 Family history of ischemic heart disease and other diseases of the circulatory system: Secondary | ICD-10-CM | POA: Diagnosis not present

## 2016-07-02 DIAGNOSIS — E1169 Type 2 diabetes mellitus with other specified complication: Secondary | ICD-10-CM | POA: Diagnosis present

## 2016-07-02 DIAGNOSIS — Z7984 Long term (current) use of oral hypoglycemic drugs: Secondary | ICD-10-CM | POA: Diagnosis not present

## 2016-07-02 DIAGNOSIS — Z888 Allergy status to other drugs, medicaments and biological substances status: Secondary | ICD-10-CM | POA: Diagnosis not present

## 2016-07-02 DIAGNOSIS — Z79899 Other long term (current) drug therapy: Secondary | ICD-10-CM | POA: Diagnosis not present

## 2016-07-02 DIAGNOSIS — E669 Obesity, unspecified: Secondary | ICD-10-CM | POA: Diagnosis present

## 2016-07-02 DIAGNOSIS — T502X5A Adverse effect of carbonic-anhydrase inhibitors, benzothiadiazides and other diuretics, initial encounter: Secondary | ICD-10-CM | POA: Diagnosis not present

## 2016-07-02 DIAGNOSIS — E785 Hyperlipidemia, unspecified: Secondary | ICD-10-CM | POA: Diagnosis present

## 2016-07-02 DIAGNOSIS — K760 Fatty (change of) liver, not elsewhere classified: Secondary | ICD-10-CM | POA: Diagnosis not present

## 2016-07-02 DIAGNOSIS — Z853 Personal history of malignant neoplasm of breast: Secondary | ICD-10-CM | POA: Diagnosis not present

## 2016-07-02 DIAGNOSIS — Z800 Family history of malignant neoplasm of digestive organs: Secondary | ICD-10-CM | POA: Diagnosis not present

## 2016-07-02 DIAGNOSIS — M1710 Unilateral primary osteoarthritis, unspecified knee: Secondary | ICD-10-CM | POA: Diagnosis present

## 2016-07-02 DIAGNOSIS — K859 Acute pancreatitis without necrosis or infection, unspecified: Secondary | ICD-10-CM | POA: Diagnosis not present

## 2016-07-02 LAB — CBC
HCT: 31.5 % — ABNORMAL LOW (ref 36.0–46.0)
Hemoglobin: 10.4 g/dL — ABNORMAL LOW (ref 12.0–15.0)
MCH: 30.3 pg (ref 26.0–34.0)
MCHC: 33 g/dL (ref 30.0–36.0)
MCV: 91.8 fL (ref 78.0–100.0)
Platelets: 186 10*3/uL (ref 150–400)
RBC: 3.43 MIL/uL — ABNORMAL LOW (ref 3.87–5.11)
RDW: 16.9 % — ABNORMAL HIGH (ref 11.5–15.5)
WBC: 8.9 10*3/uL (ref 4.0–10.5)

## 2016-07-02 LAB — COMPREHENSIVE METABOLIC PANEL
ALT: 185 U/L — ABNORMAL HIGH (ref 14–54)
AST: 125 U/L — ABNORMAL HIGH (ref 15–41)
Albumin: 4 g/dL (ref 3.5–5.0)
Alkaline Phosphatase: 71 U/L (ref 38–126)
Anion gap: 12 (ref 5–15)
BUN: 17 mg/dL (ref 6–20)
CO2: 24 mmol/L (ref 22–32)
Calcium: 9.2 mg/dL (ref 8.9–10.3)
Chloride: 107 mmol/L (ref 101–111)
Creatinine, Ser: 1.06 mg/dL — ABNORMAL HIGH (ref 0.44–1.00)
GFR calc Af Amer: >60 mL/min (ref >60)
GFR calc non Af Amer: 53 mL/min — ABNORMAL LOW (ref >60)
Glucose, Bld: 148 mg/dL — ABNORMAL HIGH (ref 65–99)
Potassium: 3.6 mmol/L (ref 3.5–5.1)
Sodium: 143 mmol/L (ref 135–145)
Total Bilirubin: 1.6 mg/dL — ABNORMAL HIGH (ref 0.3–1.2)
Total Protein: 6.5 g/dL (ref 6.5–8.1)

## 2016-07-02 LAB — GLUCOSE, CAPILLARY
Glucose-Capillary: 120 mg/dL — ABNORMAL HIGH (ref 65–99)
Glucose-Capillary: 129 mg/dL — ABNORMAL HIGH (ref 65–99)
Glucose-Capillary: 147 mg/dL — ABNORMAL HIGH (ref 65–99)
Glucose-Capillary: 153 mg/dL — ABNORMAL HIGH (ref 65–99)
Glucose-Capillary: 160 mg/dL — ABNORMAL HIGH (ref 65–99)
Glucose-Capillary: 95 mg/dL (ref 65–99)

## 2016-07-02 LAB — LIPASE, BLOOD: Lipase: 676 U/L — ABNORMAL HIGH (ref 11–51)

## 2016-07-02 MED ORDER — HYDROMORPHONE HCL 2 MG/ML IJ SOLN
0.5000 mg | INTRAMUSCULAR | Status: DC | PRN
  Administered 2016-07-02 (×2): 0.5 mg via INTRAVENOUS
  Filled 2016-07-02: qty 1

## 2016-07-02 MED ORDER — ONDANSETRON HCL 4 MG/2ML IJ SOLN
4.0000 mg | Freq: Four times a day (QID) | INTRAMUSCULAR | Status: DC | PRN
  Administered 2016-07-02: 4 mg via INTRAVENOUS
  Filled 2016-07-02: qty 2

## 2016-07-02 NOTE — Progress Notes (Signed)
   Subjective: Patient had an episode of abdominal pain overnight. She also states that she threw up her medication. When seen on rounds this morning she was having no abdominal pain. She denied nausea or vomiting. We discussed with her that today we will advance her diet and see if she is able to handle clear liquids. We'll also continue to work with pain control and we'll treat her nausea appropriately.  Objective:  Vital signs in last 24 hours: Vitals:   07/01/16 1955 07/01/16 2214 07/02/16 0548 07/02/16 1248  BP: (!) 145/68 (!) 106/54 (!) 132/57 (!) 121/58  Pulse: 81 79 83 80  Resp:  17 17 18   Temp: 98.6 F (37 C) 98.7 F (37.1 C) 98.3 F (36.8 C) 97.9 F (36.6 C)  TempSrc: Oral Oral Oral Oral  SpO2: 98% 97% 100% 98%  Weight:      Height:       Physical Exam  Constitutional: [name], place, and time. She appears well-developed and well-nourished.  Cardiovascular: Normal rate and regular rhythm.   No murmur heard. Respiratory: Effort normal and breath sounds normal. No respiratory distress. She has no wheezes.  GI: Soft. Bowel sounds are normal. She exhibits no distension. There is tenderness.  Mild tenderness on palpation today.  Musculoskeletal: She exhibits no edema.  Neurological: She is alert and oriented to person, place, and time.     Assessment/Plan:  Principal Problem:   Pancreatitis Active Problems:   Type 2 diabetes mellitus with renal manifestations (HCC)   Hypertension associated with diabetes ([place])   Fatty liver  71 yo f with DM II, HTN, and other medical problems here with abd pain found to have pancreatitis  # Pancreatitis - mild Lipase 973 on admission. Minimal pain overnight. No pain, nausea or vomiting when evaluated on rounds this morning. Her LFTs are improving. Most recent AST of 125 and ALT 185. I suspect that the patient's presentation was secondary to a mild case of pancreatitis. The underlying etiology to cause her  pancreatitis remains unclear. It is still possible that either her lisinopril or hydrochlorothiazide could've been the etiology of her presentation. We will also order IgG4 to rule out autoimmune pancreatitis. We have held her potentially offensive medications. Today we will advance her diet to clears and see how she does. If she is able to keep fluids down and her pain is controlled she may be appropriate for discharge tomorrow. -- will hold lisinopril and HCTZ- for hypertension could start calcium channel blocker with amlodipine -- Advance diet to clears -- Hydromorphone 0.5 mg every 3 hours when necessary pain   # DM II  Hold metformin and glipizide. Will do SSI here  # HTN  Hold current antihypertensives. Currently, she is not hypertensive. If she becomes hypertensive I would start back a calcium channel blocker specifically amlodipine. Also, at discharge I would consider sending the patient home on amlodipine as opposed to lisinopril and hydrochlorothiazide. -- Consider amlodipine at discharge    Dispo: Anticipated discharge tomorrow.    Ophelia Shoulder, MD 07/02/2016, 1:40 PM Pager: [phone]

## 2016-07-03 DIAGNOSIS — K853 Drug induced acute pancreatitis without necrosis or infection: Secondary | ICD-10-CM

## 2016-07-03 DIAGNOSIS — Z79899 Other long term (current) drug therapy: Secondary | ICD-10-CM

## 2016-07-03 DIAGNOSIS — I10 Essential (primary) hypertension: Secondary | ICD-10-CM

## 2016-07-03 DIAGNOSIS — E119 Type 2 diabetes mellitus without complications: Secondary | ICD-10-CM

## 2016-07-03 DIAGNOSIS — Z888 Allergy status to other drugs, medicaments and biological substances status: Secondary | ICD-10-CM

## 2016-07-03 DIAGNOSIS — Z7984 Long term (current) use of oral hypoglycemic drugs: Secondary | ICD-10-CM

## 2016-07-03 DIAGNOSIS — T502X5A Adverse effect of carbonic-anhydrase inhibitors, benzothiadiazides and other diuretics, initial encounter: Secondary | ICD-10-CM

## 2016-07-03 DIAGNOSIS — K760 Fatty (change of) liver, not elsewhere classified: Secondary | ICD-10-CM

## 2016-07-03 LAB — GLUCOSE, CAPILLARY
Glucose-Capillary: 101 mg/dL — ABNORMAL HIGH (ref 65–99)
Glucose-Capillary: 111 mg/dL — ABNORMAL HIGH (ref 65–99)
Glucose-Capillary: 112 mg/dL — ABNORMAL HIGH (ref 65–99)
Glucose-Capillary: 159 mg/dL — ABNORMAL HIGH (ref 65–99)
Glucose-Capillary: 84 mg/dL (ref 65–99)

## 2016-07-03 LAB — IGG 4: IgG, Subclass 4: 9 mg/dL (ref 2–96)

## 2016-07-03 NOTE — Progress Notes (Signed)
Pt given discharge instructions, prescriptions, and care notes. Pt verbalized understanding AEB no further questions or concerns at this time. IV was discontinued, no redness, pain, or swelling noted at this time. Pt left the floor via wheelchair with staff in stable condition. 

## 2016-07-03 NOTE — Progress Notes (Signed)
   Subjective: [name] reports no pain, nausea, or discomfort this morning. She tolerated a liquid diet with no symptoms yesterday as well. No BM yet but has eaten no solid food. She required no PRN medications and feels ready to go home.   Objective: Vital signs in last 24 hours: Vitals:   07/02/16 1248 07/02/16 1826 07/02/16 2036 07/03/16 0406  BP: (!) 121/58 132/70 127/64 (!) 116/57  Pulse: 80 82 85 87  Resp: 18 18 17 17   Temp: 97.9 F (36.6 C) 98.7 F (37.1 C) 99.2 F (37.3 C) 99.2 F (37.3 C)  TempSrc: Oral Oral Oral Oral  SpO2: 98% 97% 100% 97%  Weight:      Height:        Intake/Output Summary (Last 24 hours) at 07/03/16 [id number] Last data filed at 07/03/16 [id number]  Gross per 24 hour  Intake              240 ml  Output                0 ml  Net              240 ml   Physical Exam General appearance: Alert and oriented, elderly woman in no distress, in good spirits HENT: Normocephalic, atraumatic Cardiovascular: Regular rate and rhythm, no murmurs, rubs, gallops Respiratory/Chest: Clear to ausculation bilaterally, normal work of breathing Abdomen: Bowel sounds present, soft, non-tender, non-distended Skin: Warm, dry, intact Ext: No edema, 2+ peripheral pulses Psych: Normal affect  Labs / Imaging / Procedures: CBC Latest Ref Rng & Units 07/02/2016 07/01/2016 01/04/2016  WBC 4.0 - 10.5 K/uL 8.9 5.3 4.7  Hemoglobin 12.0 - 15.0 g/dL 10.4(L) 10.5(L) 11.0(L)  Hematocrit 36.0 - 46.0 % 31.5(L) 32.2(L) 34.3(L)  Platelets 150 - 400 K/uL 186 191 184   BMP Latest Ref Rng & Units 07/02/2016 07/01/2016 01/15/2016  Glucose 65 - 99 mg/dL 148(H) 138(H) 245(H)  BUN 6 - 20 mg/dL 17 14 18   Creatinine 0.44 - 1.00 mg/dL 1.06(H) 1.10(H) 0.95  BUN/Creat Ratio 12 - 28 - - 19  Sodium 135 - 145 mmol/L 143 141 143  Potassium 3.5 - 5.1 mmol/L 3.6 3.6 3.6  Chloride 101 - 111 mmol/L 107 100(L) 96  CO2 22 - 32 mmol/L 24 29 27   Calcium 8.9 - 10.3 mg/dL 9.2 9.8 9.6   No results  found.  Assessment/Plan:  [name] is a 71 y.o. with PMH DM2, HTN, breast cancer who presented with abdominal pain and found to have pancreatitis   Principal Problem:   Pancreatitis Active Problems:   Type 2 diabetes mellitus with renal manifestations ([place])   Hypertension associated with diabetes ([place])   Fatty liver  Pancreatitis - mild, resolved. Lipase 973 on admission. Abd US unremarkable, normal lipid panel and IgG 4. Most likely etiology is HCTZ, which was discontinued on admission. Tolerating advancing diet with resolution of pain and N/V. -- Advance diet to carb modified today -- Encouraged to consume bland diet for a few days -- Added HCTZ to allergy list  DM II  Hold metformin and glipizide. SSI  HTN  Held HCTZ and Lisinopril on admission. She has remained normotensive without these medications, current bp 116/57, although not eating full diet yet.  -- Assess BP at follow up visit and consider resuming Lisinopril   Dispo: Anticipated discharge today.    LOS: 1 day   Asencion Partridge, MD 07/03/2016, 6:58 AM Pager: [phone]

## 2016-07-03 NOTE — Consult Note (Signed)
[facility] CM Primary Care Navigator  07/03/2016  [name] 1945/07/07 [id number]    Met with patient, brother and sister in-law at the bedside to identify possible discharge needs. Patient reports having severe, excruciating pain to abdomen that had led to this admission.  Patient confirms [name] with [facility] as her primary care provider.   Patient shared using [facility] [employer] to obtain medications with no problem.   Patient reports managing her own medications at home straight out of the containers.   Patient states being able to drive prior to admission and husband's relative [name]) can provide transportation to her doctors' appointments when needed.  Husband [name]) will be her primary caregiver at home according to patient.   Discharge plan is home with husband's assistance per patient.  Patient expressed understanding to call primary care provider's office when she returns back home, for a post discharge follow-up appointment within a week or sooner if needs arise.  Patient letter (with PCP's contact number) was provided as a reminder.  Discussed with patient regarding [facility] care management services available for health management (DM) and states that she had been managing it well with PCP's help. She denies any further needs or concerns at this time. [facility] care management contact information provided for future needs that may arise.  For additional questions please contact:  [name], BSN, RN-BC [facility] PRIMARY CARE Navigator Cell: [phone]

## 2016-07-03 NOTE — Discharge Summary (Signed)
Name: [name] MRN: [id number] DOB: 16-Oct-1945 71 y.o. PCP: Jillian Raring, MD  Date of Admission: 07/01/2016  3:11 AM Date of Discharge: 07/03/2016 Attending Physician: Axel Filler, MD  Discharge Diagnosis: 1. Pancreatitis 2. Hypertension 3. Type 2 diabetes  Principal Problem:   Pancreatitis Active Problems:   Type 2 diabetes mellitus with renal manifestations ([place])   Hypertension associated with diabetes ([place])   Fatty liver   Discharge Medications: Allergies as of 07/03/2016      Reactions   Hctz [hydrochlorothiazide] Other (See Comments)   Pancreatitis   Metformin Other (See Comments)   REACTION, diarrhea: 850 mg - diarrhea but tolerates 500 mg daily      Medication List    STOP taking these medications   hydrochlorothiazide 25 MG tablet Commonly known as:  HYDRODIURIL   hydrocortisone 2.5 % cream   lisinopril 40 MG tablet Commonly known as:  PRINIVIL,ZESTRIL   ondansetron 8 MG disintegrating tablet Commonly known as:  ZOFRAN ODT     TAKE these medications   anastrozole 1 MG tablet Commonly known as:  ARIMIDEX Take 1 tablet (1 mg total) by mouth daily.   atorvastatin 40 MG tablet Commonly known as:  LIPITOR TAKE 1 TABLET(40 MG) BY MOUTH AT BEDTIME   gabapentin 100 MG capsule Commonly known as:  NEURONTIN TAKE ONE CAPSULE BY MOUTH THREE TIMES DAILY What changed:  See the new instructions.   glipiZIDE 5 MG tablet Commonly known as:  GLUCOTROL TAKE 1 TABLET(5 MG) BY MOUTH TWICE DAILY BEFORE A MEAL   hydrOXYzine 25 MG tablet Commonly known as:  ATARAX/VISTARIL Take 1 tablet (25 mg total) by mouth 3 (three) times daily as needed.   metFORMIN 500 MG tablet Commonly known as:  GLUCOPHAGE TAKE 1 TABLET(500 MG) BY MOUTH TWICE DAILY WITH A MEAL   REFRESH OP Place 2 drops into both eyes as needed (for dry eyes).       Disposition and follow-up:   JillianJillian Ryan was discharged from [facility] in Stable condition.   At the hospital follow up visit please address:  1.  Pancreatitis - assess for any recurrence of abdominal pain, N/V, tolerating full diet, assess persistent LFT elevation  Hypertension - assess BP control and possible need to restart Lisinopril, stopped HCTZ 25 and Lisinopril 40 mg during hospital stay and remained normotensive  DM2 - assess diabetic control, last A1c 7.3 checked 12/2015  2.  Labs / imaging needed at time of follow-up: HbA1c, CMP  3.  Pending labs/ test needing follow-up: None  Follow-up Appointments:  PCP visit - 07/10/16  Hospital Course by problem list: Principal Problem:   Pancreatitis Active Problems:   Type 2 diabetes mellitus with renal manifestations ([place])   Hypertension associated with diabetes ([place])   Fatty liver   1. Acute Pancreatitis [name] is a 71 yo woman with PMH DM2, HTN, breast cancer on Anastrozole, fatty liver who presented on 2/27 following acute onset abdominal abdominal pain and nausea and found to have mild LFT elevation and lipase over 900 consistent with acute pancreatitis. Abdominal CT and US showed no pancreatic inflammation or gallstone disease, but did reveal fatty liver changes. Lipid panel revealed no significant hypertriglyceridemia, and IgG 4 was checked and normal (autoimmune). HCTZ was discontinued on admission as a possile cause of her pancreatitis and  remains the most likely etiology. Her abdominal pain was managed with bowel rest, PRN medications for pain and nausea, and largely resolved by 2/28. Her diet was  advanced without difficulty and she remained asymptomatic, ambulatory, and eating solid food on 3/1 and deemed stable for discharge.   2. Hypertension - Lisinopril and HCTZ were discontinued on admission as potential cause for pancreatitis and patient remained normotensive throughout her hospital stay. HCTZ was added to allergy list as most-likely culprit for precipitating her pancreatitis. She remained normotensive on day of  discharge so Lisinopril was not resumed, decision to resume deferred to follow up visit next week.  3. Type 2 diabetes - held Metformin and Glipizide during hospital stay and achieved adequate glycemic control with sliding scale insulin.    Discharge Vitals:   BP (!) 116/57 (BP Location: Left Arm)   Pulse 87   Temp 99.2 F (37.3 C) (Oral)   Resp 17   Ht 4\' 11"  (1.499 m)   Wt 162 lb 11.2 oz (73.8 kg)   SpO2 97%   BMI 32.86 kg/m   Pertinent Labs, Studies, and Procedures:  CMP Latest Ref Rng & Units 07/02/2016 07/01/2016 01/15/2016  Glucose 65 - 99 mg/dL 148(H) 138(H) 245(H)  BUN 6 - 20 mg/dL 17 14 18   Creatinine 0.44 - 1.00 mg/dL 1.06(H) 1.10(H) 0.95  Sodium 135 - 145 mmol/L 143 141 143  Potassium 3.5 - 5.1 mmol/L 3.6 3.6 3.6  Chloride 101 - 111 mmol/L 107 100(L) 96  CO2 22 - 32 mmol/L 24 29 27   Calcium 8.9 - 10.3 mg/dL 9.2 9.8 9.6  Total Protein 6.5 - 8.1 g/dL 6.5 6.3(L) -  Total Bilirubin 0.3 - 1.2 mg/dL 1.6(H) 0.9 -  Alkaline Phos 38 - 126 U/L 71 72 -  AST 15 - 41 U/L 125(H) 178(H) -  ALT 14 - 54 U/L 185(H) 157(H) -   Results for [name] (MRN [id number]) as of 07/03/2016 18:19  Ref. Range 07/01/2016 03:49 07/02/2016 12:12  Lipase Latest Ref Range: 11 - 51 U/L 973 (H) 676 (H)   Results for [name] (MRN [id number]) as of 07/03/2016 18:19  Ref. Range 07/02/2016 12:12  IgG, Subclass 4 Latest Ref Range: 2 - 96 mg/dL 9   Ct Abdomen Pelvis W Contrast  Result Date: 07/01/2016 CLINICAL DATA:  71 year old hypertensive diabetic female with lower abdominal pain for few days getting worse. Fatty liver disease. Breast cancer post mastectomy. Subsequent encounter. EXAM: CT ABDOMEN AND PELVIS WITH CONTRAST TECHNIQUE: Multidetector CT imaging of the abdomen and pelvis was performed using the standard protocol following bolus administration of intravenous contrast. CONTRAST:  138mL ISOVUE-300 IOPAMIDOL (ISOVUE-300) INJECTION 61% COMPARISON:  07/01/2016 ultrasound. 06/14/2012 chest CT.  10/09/2010 PET-CT. 04/20/2007 CT abdomen and pelvis. FINDINGS: Lower chest: Minimal basal atelectasis. Heart slightly enlarged. Post right mastectomy. Hepatobiliary: Diffuse fatty infiltration liver. Scattered hyperdense areas are relatively similar to prior exams suggestion a benign process. Representative areas include 1 cm hyperdense region posteromedial right lobe liver and 1.5 cm rounded hyperdense region left lobe liver. Other areas are slightly hazy with indistinct borders. This includes subtle hyperdensity adjacent to the gallbladder fossa. Findings may reflect result of focal fatty sparing. No calcified gallstones. Pancreas: No pancreatic mass or inflammation. Top-normal size distal common bile duct without calcified common bile duct stone noted. Spleen: No mass or enlargement. Adrenals/Urinary Tract: No renal or ureteral obstructing stone or evidence hydronephrosis. No worrisome renal or adrenal lesion. Noncontrast filled views of the urinary bladder unremarkable. Stomach/Bowel: Suspect small hiatal hernia. Stomach under distended without gross abnormality noted. Scattered colonic diverticula without extraluminal bowel inflammatory process, free fluid or free air. No inflammation surrounds the appendix. Portions  of the colon are under distended and evaluation limited for detecting subtle mass. Vascular/Lymphatic: Atherosclerotic changes without abdominal aortic aneurysm or large vessel occlusion. No adenopathy. Reproductive: Post hysterectomy.  No adnexal mass identified. Other: No bowel containing hernia. Musculoskeletal: Degenerative changes lumbar spine without osseous destructive lesion. IMPRESSION: Scattered colonic diverticula without extraluminal bowel inflammatory process, free fluid or free air. No inflammation surrounds the appendix. Fatty liver with scattered dense lesions relatively similar to prior exams suggesting benign process possibly areas of focal fatty sparing. Aortic atherosclerosis.  Electronically Signed   By: Genia Del M.D.   On: 07/01/2016 08:39   US Abdomen Limited  Result Date: 07/01/2016 CLINICAL DATA:  71 year old female with abdominal pain since 1900 hours yesterday. Pancreatitis. EXAM: US ABDOMEN LIMITED - RIGHT UPPER QUADRANT COMPARISON:  10/09/2010.  04/20/2007 FINDINGS: Gallbladder: No gallstones or wall thickening visualized. No sonographic Murphy sign noted by sonographer. Common bile duct: Diameter: 4 mm, normal Liver: Diffusely echogenic (image 54). There is a 17 mm hypoechoic lesion in the anterior left lobe on image 44 which likely corresponds to a 12 mm hyperechoic lesion seen on the 2008 CT. Vague right lobe hypoechoic lesion measuring 10 mm on image 36 which might correspond to 1 of 3 other right lobe lesions seen on the 2008 CT. No other discrete liver lesion. No intrahepatic biliary ductal dilatation. IMPRESSION: 1. Normal gallbladder.  Negative for cholelithiasis. 2. Chronic fatty liver disease. Two small liver lesions are visible by ultrasound today and likely correspond to some of those seen by CT in 2008. Favor benign etiology such as fatty sparing or hemangioma. Electronically Signed   By: Genevie Ann M.D.   On: 07/01/2016 07:04   Dg Abdomen Acute W/chest  Result Date: 07/01/2016 CLINICAL DATA:  Acute onset of lower generalized abdominal pain and nausea. Lightheadedness. Initial encounter. EXAM: DG ABDOMEN ACUTE W/ 1V CHEST COMPARISON:  Chest radiograph performed 11/21/2015 FINDINGS: The lungs are well-aerated and clear. There is no evidence of focal opacification, pleural effusion or pneumothorax. The cardiomediastinal silhouette is borderline normal in size. The visualized bowel gas pattern is unremarkable. Scattered stool and air are seen within the colon; there is no evidence of small bowel dilatation to suggest obstruction. No free intra-abdominal air is identified on the provided upright view. No acute osseous abnormalities are seen; the sacroiliac  joints are unremarkable in appearance. Scattered clips are noted overlying the right axilla. IMPRESSION: 1. Unremarkable bowel gas pattern; no free intra-abdominal air seen. Small to moderate amount of stool noted in the colon. 2. Minimal left basilar atelectasis noted.  Lungs otherwise clear. Electronically Signed   By: Garald Balding M.D.   On: 07/01/2016 04:29   Discharge Instructions: Discharge Instructions    Diet - low sodium heart healthy    Complete by:  As directed    Discharge instructions    Complete by:  As directed    Please stop taking your blood pressure medications Hydrochlorothiazide (HCTZ) and Lisinopril. Your blood pressure has been normal without these medications during your hospital stay and additionally, we think the HCTZ may have caused this pancreatitis.   You should adhere to a relatively bland diet for the next few days, avoid oily, fatty, or meat-heavy foods.  You can continue to take your other medications as prescribed.   Please return to IM clinic for a follow up visit with your PCP next week (3/8).   Increase activity slowly    Complete by:  As directed  Signed: Asencion Partridge, MD 07/03/2016, 6:18 PM   Pager: [phone]

## 2016-07-09 ENCOUNTER — Telehealth: Payer: Self-pay | Admitting: Internal Medicine

## 2016-07-09 NOTE — Telephone Encounter (Signed)
APT. REMINDER CALL, LMTCB °

## 2016-07-10 ENCOUNTER — Ambulatory Visit (INDEPENDENT_AMBULATORY_CARE_PROVIDER_SITE_OTHER): Payer: Medicare Other | Admitting: Internal Medicine

## 2016-07-10 ENCOUNTER — Encounter: Payer: Self-pay | Admitting: Internal Medicine

## 2016-07-10 VITALS — BP 173/71 | HR 76 | Wt 164.9 lb

## 2016-07-10 DIAGNOSIS — Z803 Family history of malignant neoplasm of breast: Secondary | ICD-10-CM | POA: Diagnosis not present

## 2016-07-10 DIAGNOSIS — Z5189 Encounter for other specified aftercare: Secondary | ICD-10-CM | POA: Diagnosis present

## 2016-07-10 DIAGNOSIS — Z8249 Family history of ischemic heart disease and other diseases of the circulatory system: Secondary | ICD-10-CM | POA: Diagnosis not present

## 2016-07-10 DIAGNOSIS — Z7984 Long term (current) use of oral hypoglycemic drugs: Secondary | ICD-10-CM

## 2016-07-10 DIAGNOSIS — Z634 Disappearance and death of family member: Secondary | ICD-10-CM | POA: Diagnosis not present

## 2016-07-10 DIAGNOSIS — C50911 Malignant neoplasm of unspecified site of right female breast: Secondary | ICD-10-CM | POA: Diagnosis not present

## 2016-07-10 DIAGNOSIS — I10 Essential (primary) hypertension: Secondary | ICD-10-CM

## 2016-07-10 DIAGNOSIS — E1129 Type 2 diabetes mellitus with other diabetic kidney complication: Secondary | ICD-10-CM

## 2016-07-10 DIAGNOSIS — Z79811 Long term (current) use of aromatase inhibitors: Secondary | ICD-10-CM

## 2016-07-10 DIAGNOSIS — I152 Hypertension secondary to endocrine disorders: Secondary | ICD-10-CM

## 2016-07-10 DIAGNOSIS — T502X5D Adverse effect of carbonic-anhydrase inhibitors, benzothiadiazides and other diuretics, subsequent encounter: Secondary | ICD-10-CM | POA: Diagnosis not present

## 2016-07-10 DIAGNOSIS — Z800 Family history of malignant neoplasm of digestive organs: Secondary | ICD-10-CM

## 2016-07-10 DIAGNOSIS — Z9221 Personal history of antineoplastic chemotherapy: Secondary | ICD-10-CM | POA: Diagnosis not present

## 2016-07-10 DIAGNOSIS — K853 Drug induced acute pancreatitis without necrosis or infection: Secondary | ICD-10-CM

## 2016-07-10 DIAGNOSIS — Z823 Family history of stroke: Secondary | ICD-10-CM

## 2016-07-10 DIAGNOSIS — N289 Disorder of kidney and ureter, unspecified: Secondary | ICD-10-CM | POA: Diagnosis not present

## 2016-07-10 DIAGNOSIS — G629 Polyneuropathy, unspecified: Secondary | ICD-10-CM

## 2016-07-10 DIAGNOSIS — E1121 Type 2 diabetes mellitus with diabetic nephropathy: Secondary | ICD-10-CM | POA: Diagnosis not present

## 2016-07-10 DIAGNOSIS — E1159 Type 2 diabetes mellitus with other circulatory complications: Secondary | ICD-10-CM

## 2016-07-10 DIAGNOSIS — E785 Hyperlipidemia, unspecified: Secondary | ICD-10-CM | POA: Diagnosis not present

## 2016-07-10 DIAGNOSIS — F4320 Adjustment disorder, unspecified: Secondary | ICD-10-CM

## 2016-07-10 DIAGNOSIS — Z79899 Other long term (current) drug therapy: Secondary | ICD-10-CM | POA: Diagnosis not present

## 2016-07-10 DIAGNOSIS — Z23 Encounter for immunization: Secondary | ICD-10-CM | POA: Diagnosis present

## 2016-07-10 DIAGNOSIS — G6289 Other specified polyneuropathies: Secondary | ICD-10-CM

## 2016-07-10 DIAGNOSIS — Z923 Personal history of irradiation: Secondary | ICD-10-CM

## 2016-07-10 LAB — GLUCOSE, CAPILLARY: Glucose-Capillary: 117 mg/dL — ABNORMAL HIGH (ref 65–99)

## 2016-07-10 LAB — POCT GLYCOSYLATED HEMOGLOBIN (HGB A1C): Hemoglobin A1C: 7

## 2016-07-10 MED ORDER — LISINOPRIL 40 MG PO TABS: 40.0000 mg | ORAL_TABLET | Freq: Every day | ORAL | 0 refills | Status: DC

## 2016-07-10 NOTE — Addendum Note (Signed)
Addended by: Marcelino Duster on: 07/10/2016 04:37 PM   Modules accepted: Orders

## 2016-07-10 NOTE — Assessment & Plan Note (Signed)
Currently on Arimidex therapy. No signs or symptoms of recurrence. She follows regularly with oncology in [facility].  Assessment: Breast Cancer in remission  Plan: Continue Arimidex, follow up with oncology annually.

## 2016-07-10 NOTE — Patient Instructions (Signed)
We will restart your lisinopril and have you come back to check your blood pressure in 1 month.

## 2016-07-10 NOTE — Assessment & Plan Note (Signed)
Pt is currently compliant with metformin and glipizide. She has an A1c today of 7.0 which is at her target. She avoids sweets and is hopeful to increase her physical activity in the Spring with good whether.  Assessment: Well controlled t2DM at goal  Plan: Continue metformin and glipizide

## 2016-07-10 NOTE — Progress Notes (Signed)
CC: HFU HPI: [name] is a 71 y.o. female with DM, HTN, HLD, h/o breast CA who presents for hospital follow up after medication induced pancreatitis  Please see Problem-based charting for HPI and the status of patient's chronic medical conditions.  Past Medical History:  Diagnosis Date  . Allergy   . Anemia   . Breast CA ([place])    (Rt) breast ca dx 4/12---  S/P RADICAL MASTECTOMY AND CHEMORADIATION  . Diabetes mellitus ORAL MED  . Fatty liver disease, nonalcoholic   . History of hyperkalemia   . Hx antineoplastic chemo  10/12 - 06/24/11   PACLITAX EL COMPLETED 06/24/11  . Hyperlipidemia   . Hypertension   . Mild nonproliferative diabetic retinopathy of right eye ([place]) 03/19/11   Dr. Joseph Art  . Numbness and tingling    Hx: of in fingers and toes since chemotherapy  . OA (osteoarthritis) of knee    RIGHT LEG  . Obesity   . Renal lesion    Social History  Substance Use Topics  . Smoking status: Never Smoker  . Smokeless tobacco: Never Used  . Alcohol use No   Family History  Problem Relation Age of Onset  . Stroke Father   . Heart disease Sister   . Cancer Sister     breast  . Heart disease Brother   . Cancer Sister     COLON CANCER   Review of Systems: ROS in HPI. Otherwise: Review of Systems  Constitutional: Negative for chills, fever and weight loss.  Respiratory: Negative for cough and shortness of breath.   Cardiovascular: Negative for chest pain and leg swelling.  Gastrointestinal: Positive for abdominal pain and nausea. Negative for constipation, diarrhea and vomiting.  Genitourinary: Negative for dysuria, frequency and urgency.   Physical Exam: Vitals:   07/10/16 1340  BP: (!) 173/71  Pulse: 76  SpO2: 100%  Weight: 164 lb 14.4 oz (74.8 kg)   Physical Exam  Constitutional: She appears well-developed. She is cooperative. No distress.  Cardiovascular: Normal rate, regular rhythm, normal heart sounds and normal pulses.  Exam reveals  no gallop.   No murmur heard. Pulses:      Dorsalis pedis pulses are 2+ on the right side, and 2+ on the left side.       Posterior tibial pulses are 2+ on the right side, and 2+ on the left side.  Pulmonary/Chest: Effort normal and breath sounds normal. No respiratory distress. She has no wheezes. She has no rhonchi. She has no rales. Breasts are symmetrical.  Abdominal: Soft. Bowel sounds are normal. There is no tenderness.  Musculoskeletal: She exhibits no edema.       Right foot: There is normal range of motion and no deformity.       Left foot: There is normal range of motion and no deformity.  Feet:  Right Foot:  Protective Sensation: 5 sites tested. 5 sites sensed.  Skin Integrity: Negative for ulcer or skin breakdown.  Left Foot:  Protective Sensation: 5 sites tested. 5 sites sensed.  Skin Integrity: Negative for ulcer or skin breakdown.  Psychiatric:  Recently lost her mother while hospitalized and with acute grief reaction. Coping with friends and family well, but still sad    Assessment & Plan:  See encounters tab for problem based medical decision making. Patient discussed with Dr. Eppie Gibson  Type 2 diabetes mellitus with renal manifestations ([place]) Pt is currently compliant with metformin and glipizide. She has an A1c today of 7.0  which is at her target. She avoids sweets and is hopeful to increase her physical activity in the Spring with good whether.  Assessment: Well controlled t2DM at goal  Plan: Continue metformin and glipizide  Peripheral neuropathy Pt still complains of intermittent neuropathies in bilateral LE. These are mild and unchanged from last visit, likely associated with h/o radiation from Breast CA. Symptoms are well controlled on low dose gabapentin, and pt is not interested in increasing her dose currently.  Assessment: Peripheral polyneuropathy associated with h/o radiation  Plan: Continue gabapentin 100mg  TID PRN  Hypertension associated with  diabetes ([place]) Pt has elevated BP today of 173/71. This is in the setting of having all her antihypertensives discontinued during her recent hospitalization. HCTZ was discontinued d/t association with pancreatitis and she was normotensive in the hospital so lisinopril was held. She denies any HA, vision changes, CP, SOB.  Assessment: Uncontrolled hypertension after discontinuation of pharmacotherapy  Plan: Restart lisinopril 40mg  qD. Recheck BP in 1 month. If still elevated, plan to add amlodipine 5mg .  Hyperlipidemia Currently on high intensity statin therapy. Her ASCVD 10 year risk is high today given uncontrolled BP. But last lipid panel 1 year ago showed good total cholesterol 114 and mildly low HDL at 40.  Assessment: Good lipid control on high intensity statin with elevated ASCVD risk.  Plan: Continue Atorvastatin  Breast cancer, right breast Currently on Arimidex therapy. No signs or symptoms of recurrence. She follows [facility] with oncology [facility].  Assessment: Breast Cancer in remission  Plan: Continue Arimidex, follow up with oncology annually.  Pancreatitis Patient was recently hospitalized for pancreatitis which was thought to be secondary to her HCTZ medication. This medication was discontinued her symptoms have begun to resolve. She still experiences some mild abdominal pain with certain foods, this is accompanied by nausea. We will plan to continually advance her diet slowly. Avoid medications with sensations for pancreatitis.  Assessment: Drug-induced pancreatitis, resolving  Plan: Avoid offending medications, advance diet slowly.   Signed: Holley Raring, MD 07/10/2016, 2:21 PM  Pager: [phone]  \

## 2016-07-10 NOTE — Assessment & Plan Note (Signed)
Pt still complains of intermittent neuropathies in bilateral LE. These are mild and unchanged from last visit, likely associated with h/o radiation from Breast CA. Symptoms are well controlled on low dose gabapentin, and pt is not interested in increasing her dose currently.  Assessment: Peripheral polyneuropathy associated with h/o radiation  Plan: Continue gabapentin 100mg  TID PRN

## 2016-07-10 NOTE — Progress Notes (Signed)
Case discussed with Dr. Gay Filler at the time of the visit.  We reviewed the resident's history and exam and pertinent patient test results.  I agree with the assessment, diagnosis and plan of care documented in the resident's note.  There are 2 important additions I would like to make to Dr. Octaviano Batty note.  First, given her risk, guidelines recommend a blood pressure target of < 130/80 and this will be pursued.  Her breast cancer is being actively treated with anti-hormonal therapy.

## 2016-07-10 NOTE — Assessment & Plan Note (Addendum)
Currently on high intensity statin therapy. Her ASCVD 10 year risk is high today given uncontrolled BP. But last lipid panel 1 year ago showed good total cholesterol 114 and mildly low HDL at 40.  Assessment: Good lipid control on high intensity statin with elevated ASCVD risk.  Plan: Continue Atorvastatin

## 2016-07-10 NOTE — Assessment & Plan Note (Signed)
Patient was recently hospitalized for pancreatitis which was thought to be secondary to her HCTZ medication. This medication was discontinued her symptoms have begun to resolve. She still experiences some mild abdominal pain with certain foods, this is accompanied by nausea. We will plan to continually advance her diet slowly. Avoid medications with sensations for pancreatitis.  Assessment: Drug-induced pancreatitis, resolving  Plan: Avoid offending medications, advance diet slowly.

## 2016-07-10 NOTE — Assessment & Plan Note (Signed)
Pt has elevated BP today of 173/71. This is in the setting of having all her antihypertensives discontinued during her recent hospitalization. HCTZ was discontinued d/t association with pancreatitis and she was normotensive in the hospital so lisinopril was held. She denies any HA, vision changes, CP, SOB.  Assessment: Uncontrolled hypertension after discontinuation of pharmacotherapy  Plan: Restart lisinopril 40mg  qD. Recheck BP in 1 month. If still elevated, plan to add amlodipine 5mg .

## 2016-07-31 ENCOUNTER — Telehealth: Payer: Self-pay | Admitting: Internal Medicine

## 2016-07-31 NOTE — Telephone Encounter (Signed)
APT. REMINDER CALL, LMTCB °

## 2016-08-04 ENCOUNTER — Ambulatory Visit (INDEPENDENT_AMBULATORY_CARE_PROVIDER_SITE_OTHER): Payer: Medicare Other | Admitting: Internal Medicine

## 2016-08-04 ENCOUNTER — Encounter (INDEPENDENT_AMBULATORY_CARE_PROVIDER_SITE_OTHER): Payer: Self-pay

## 2016-08-04 VITALS — BP 158/89 | HR 80 | Temp 97.9°F | Ht 59.0 in | Wt 160.3 lb

## 2016-08-04 DIAGNOSIS — Z79899 Other long term (current) drug therapy: Secondary | ICD-10-CM | POA: Diagnosis not present

## 2016-08-04 DIAGNOSIS — I152 Hypertension secondary to endocrine disorders: Secondary | ICD-10-CM

## 2016-08-04 DIAGNOSIS — Z8719 Personal history of other diseases of the digestive system: Secondary | ICD-10-CM

## 2016-08-04 DIAGNOSIS — E1159 Type 2 diabetes mellitus with other circulatory complications: Secondary | ICD-10-CM

## 2016-08-04 DIAGNOSIS — I10 Essential (primary) hypertension: Secondary | ICD-10-CM | POA: Diagnosis not present

## 2016-08-04 MED ORDER — LISINOPRIL 40 MG PO TABS: 40.0000 mg | ORAL_TABLET | Freq: Every day | ORAL | 2 refills | Status: DC

## 2016-08-04 MED ORDER — AMLODIPINE BESYLATE 5 MG PO TABS: 5.0000 mg | ORAL_TABLET | Freq: Every day | ORAL | 2 refills | Status: DC

## 2016-08-04 NOTE — Patient Instructions (Signed)
[name],   It was a pleasure meeting with you today. I am going to start you on a new medication today called Amlodipine. Please take it once a day. I would also like you to start checking your blood pressure at home again and keep a log again. I am going to check some blood work today.  I would like to see you back in clinic in about 2 weeks for follow up.   Amlodipine tablets What is this medicine? AMLODIPINE (am LOE di peen) is a calcium-channel blocker. It affects the amount of calcium found in your heart and muscle cells. This relaxes your blood vessels, which can reduce the amount of work the heart has to do. This medicine is used to lower high blood pressure. It is also used to prevent chest pain. This medicine may be used for other purposes; ask your health care provider or pharmacist if you have questions. COMMON BRAND NAME(S): Norvasc What should I tell my health care provider before I take this medicine? They need to know if you have any of these conditions: -heart problems like heart failure or aortic stenosis -liver disease -an unusual or allergic reaction to amlodipine, other medicines, foods, dyes, or preservatives -pregnant or trying to get pregnant -breast-feeding How should I use this medicine? Take this medicine by mouth with a glass of water. Follow the directions on the prescription label. Take your medicine at regular intervals. Do not take more medicine than directed. Talk to your pediatrician regarding the use of this medicine in children. Special care may be needed. This medicine has been used in children as young as 6. Persons over 64 years old may have a stronger reaction to this medicine and need smaller doses. Overdosage: If you think you have taken too much of this medicine contact a poison control center or emergency room at once. NOTE: This medicine is only for you. Do not share this medicine with others. What if I miss a dose? If you miss a dose, take it  as soon as you can. If it is almost time for your next dose, take only that dose. Do not take double or extra doses. What may interact with this medicine? -herbal or dietary supplements -local or general anesthetics -medicines for high blood pressure -medicines for prostate problems -rifampin This list may not describe all possible interactions. Give your health care provider a list of all the medicines, herbs, non-prescription drugs, or dietary supplements you use. Also tell them if you smoke, drink alcohol, or use illegal drugs. Some items may interact with your medicine. What should I watch for while using this medicine? Visit your doctor or health care professional for regular check ups. Check your blood pressure and pulse rate regularly. Ask your health care professional what your blood pressure and pulse rate should be, and when you should contact him or her. This medicine may make you feel confused, dizzy or lightheaded. Do not drive, use machinery, or do anything that needs mental alertness until you know how this medicine affects you. To reduce the risk of dizzy or fainting spells, do not sit or stand up quickly, especially if you are an older patient. Avoid alcoholic drinks; they can make you more dizzy. Do not suddenly stop taking amlodipine. Ask your doctor or health care professional how you can gradually reduce the dose. What side effects may I notice from receiving this medicine? Side effects that you should report to your doctor or health care professional as soon  as possible: -allergic reactions like skin rash, itching or hives, swelling of the face, lips, or tongue -breathing problems -changes in vision or hearing -chest pain -fast, irregular heartbeat -swelling of legs or ankles Side effects that usually do not require medical attention (report to your doctor or health care professional if they continue or are bothersome): -dry mouth -facial flushing -nausea,  vomiting -stomach gas, pain -tired, weak -trouble sleeping This list may not describe all possible side effects. Call your doctor for medical advice about side effects. You may report side effects to FDA at 1-800-FDA-1088. Where should I keep my medicine? Keep out of the reach of children. Store at room temperature between 59 and 86 degrees F (15 and 30 degrees C). Protect from light. Keep container tightly closed. Throw away any unused medicine after the expiration date. NOTE: This sheet is a summary. It may not cover all possible information. If you have questions about this medicine, talk to your doctor, pharmacist, or health care provider.  2018 Elsevier/Gold Standard (2012-03-19 11:40:58)

## 2016-08-04 NOTE — Progress Notes (Signed)
   CC: HTN follow up  HPI:  [name] is a 71 y.o. female with a past medical history listed below here today for follow up of her HTN.  For details of today's visit and the status of her chronic medical issues please refer to the assessment and plan.  Past Medical History:  Diagnosis Date  . Allergy   . Anemia   . Breast CA ([place])    (Rt) breast ca dx 4/12---  S/P RADICAL MASTECTOMY AND CHEMORADIATION  . Diabetes mellitus ORAL MED  . Fatty liver disease, nonalcoholic   . History of hyperkalemia   . Hx antineoplastic chemo  10/12 - 06/24/11   PACLITAX EL COMPLETED 06/24/11  . Hyperlipidemia   . Hypertension   . Mild nonproliferative diabetic retinopathy of right eye ([place]) 03/19/11   Dr. Joseph Art  . Numbness and tingling    Hx: of in fingers and toes since chemotherapy  . OA (osteoarthritis) of knee    RIGHT LEG  . Obesity   . Renal lesion     Review of Systems:   See HPI  Physical Exam:  Vitals:   08/04/16 0944 08/04/16 1010  BP: (!) 176/81 (!) 158/89  Pulse: 86 80  Temp: 97.9 F (36.6 C)   TempSrc: Oral   SpO2: 99%   Weight: 160 lb 4.8 oz (72.7 kg)   Height: 4\' 11"  (1.499 m)    Physical Exam  Constitutional: She is well-developed, well-nourished, and in no distress.  HENT:  Head: Normocephalic and atraumatic.  Eyes: Pupils are equal, round, and reactive to light.  Cardiovascular: Normal rate and regular rhythm.   Pulmonary/Chest: Effort normal and breath sounds normal.   Assessment & Plan:   See Encounters Tab for problem based charting.  Patient discussed with Dr. Angelia Mould

## 2016-08-04 NOTE — Assessment & Plan Note (Signed)
BP Readings from Last 3 Encounters:  08/04/16 (!) 158/89  07/10/16 (!) 173/71  07/03/16 (!) 116/57    Lab Results  Component Value Date   NA 143 07/02/2016   K 3.6 07/02/2016   CREATININE 1.06 (H) 07/02/2016   [name] had been previously well controlled on HCTZ 25 mg and Lisinopril 40 mg daily. She was recently hospitalized in February for drug induced pancreatitis believed to be secondary to HCTZ. Her antihypertenisves were discontinued. On follow up 07/10/16, her SBP was noted to be in the 170s. Her Lisinopril 40 mg was re-started at that time.   She reports she has not checked her BP since leaving the hospital in February. Compliant with the Lisinopril. Did not take her Lisinopril this morning as she takes it with breakfast in the morning. Denies any HA, vision changes, etc. Today, her BP was 173/71 initially. Improved to 153/89 on recheck. Goal BP <130/80.  Assessment: Poorly controlled HTN  Plan: Continue Lisinopril Start Amlodipine 5 mg daily Check BMET today F/U for re-check in 2 weeks

## 2016-08-04 NOTE — Progress Notes (Signed)
Internal Medicine Clinic Attending  Case discussed with Dr. Boswell at the time of the visit.  We reviewed the resident's history and exam and pertinent patient test results.  I agree with the assessment, diagnosis, and plan of care documented in the resident's note.  

## 2016-08-05 LAB — BMP8+ANION GAP
Anion Gap: 15 mmol/L (ref 10.0–18.0)
BUN/Creatinine Ratio: 16 (ref 12–28)
BUN: 14 mg/dL (ref 8–27)
CO2: 27 mmol/L (ref 18–29)
Calcium: 9.7 mg/dL (ref 8.7–10.3)
Chloride: 99 mmol/L (ref 96–106)
Creatinine, Ser: 0.88 mg/dL (ref 0.57–1.00)
GFR calc Af Amer: 78 mL/min/{1.73_m2} (ref >59)
GFR calc non Af Amer: 67 mL/min/{1.73_m2} (ref >59)
Glucose: 71 mg/dL (ref 65–99)
Potassium: 3.7 mmol/L (ref 3.5–5.2)
Sodium: 141 mmol/L (ref 134–144)

## 2016-08-07 ENCOUNTER — Other Ambulatory Visit: Payer: Self-pay | Admitting: Student in an Organized Health Care Education/Training Program

## 2016-08-07 DIAGNOSIS — E1121 Type 2 diabetes mellitus with diabetic nephropathy: Secondary | ICD-10-CM

## 2016-08-07 DIAGNOSIS — E785 Hyperlipidemia, unspecified: Secondary | ICD-10-CM

## 2016-08-15 ENCOUNTER — Other Ambulatory Visit: Payer: Self-pay | Admitting: Oncology

## 2016-08-18 ENCOUNTER — Other Ambulatory Visit: Payer: Self-pay

## 2016-08-18 ENCOUNTER — Ambulatory Visit (INDEPENDENT_AMBULATORY_CARE_PROVIDER_SITE_OTHER): Payer: Medicare Other | Admitting: Internal Medicine

## 2016-08-18 ENCOUNTER — Encounter: Payer: Self-pay | Admitting: Internal Medicine

## 2016-08-18 ENCOUNTER — Encounter (INDEPENDENT_AMBULATORY_CARE_PROVIDER_SITE_OTHER): Payer: Self-pay

## 2016-08-18 VITALS — BP 161/77 | HR 87 | Temp 97.8°F | Ht 59.0 in | Wt 159.8 lb

## 2016-08-18 DIAGNOSIS — I152 Hypertension secondary to endocrine disorders: Secondary | ICD-10-CM

## 2016-08-18 DIAGNOSIS — Z79899 Other long term (current) drug therapy: Secondary | ICD-10-CM | POA: Diagnosis not present

## 2016-08-18 DIAGNOSIS — E1159 Type 2 diabetes mellitus with other circulatory complications: Secondary | ICD-10-CM

## 2016-08-18 DIAGNOSIS — I10 Essential (primary) hypertension: Principal | ICD-10-CM

## 2016-08-18 MED ORDER — AMLODIPINE BESYLATE 10 MG PO TABS: 10.0000 mg | ORAL_TABLET | Freq: Every day | ORAL | 2 refills | Status: DC

## 2016-08-18 NOTE — Progress Notes (Signed)
   CC: Hypertension follow-up  HPI:  [name] is a 71 y.o. woman with past medical history as noted below who presents today for follow-up of her hypertension.  BP today is elevated at 161/77. She was started on amlodipine 5 mg daily at her last visit. She reports taking this in addition to her lisinopril 40 mg daily. She reports her blood pressures at home have been elevated in the 759F systolic.   Past Medical History:  Diagnosis Date  . Allergy   . Anemia   . Breast CA ([place])    (Rt) breast ca dx 4/12---  S/P RADICAL MASTECTOMY AND CHEMORADIATION  . Diabetes mellitus ORAL MED  . Fatty liver disease, nonalcoholic   . History of hyperkalemia   . Hx antineoplastic chemo  10/12 - 06/24/11   PACLITAX EL COMPLETED 06/24/11  . Hyperlipidemia   . Hypertension   . Mild nonproliferative diabetic retinopathy of right eye ([place]) 03/19/11   Dr. Joseph Art  . Numbness and tingling    Hx: of in fingers and toes since chemotherapy  . OA (osteoarthritis) of knee    RIGHT LEG  . Obesity   . Renal lesion     Review of Systems:   General: Denies fever, chills, night sweats, changes in weight, changes in appetite HEENT: Denies headaches, ear pain, changes in vision, rhinorrhea, sore throat CV: Denies CP, palpitations, SOB, orthopnea Pulm: Denies SOB, cough, wheezing GI: Denies abdominal pain, nausea, vomiting, diarrhea, constipation, melena, hematochezia GU: Denies dysuria, hematuria, frequency Msk: Denies muscle cramps, joint pains Neuro: Denies weakness, numbness, tingling Skin: Denies rashes, bruising Psych: Denies depression, anxiety, hallucinations  Physical Exam:  Vitals:   08/18/16 0931  BP: (!) 161/77  Pulse: 87  Temp: 97.8 F (36.6 C)  TempSrc: Oral  SpO2: 99%  Weight: 159 lb 12.8 oz (72.5 kg)  Height: 4\' 11"  (1.499 m)   General: Elderly woman in NAD CV: RRR, no m/g/r Ext: No peripheral edema  Assessment & Plan:   See Encounters Tab for problem based  charting.  Patient discussed with Dr. Lynnae January

## 2016-08-18 NOTE — Patient Instructions (Signed)
General Instructions: - Increase Amlodipine to 10 mg daily - New prescription sent to pharmacy. You can take 2 pills of your current medication bottle. - Follow up in 1 week to reassess blood pressure - Please take and record your home blood pressures   Please bring your medicines with you each time you come to clinic.  Medicines may include prescription medications, over-the-counter medications, herbal remedies, eye drops, vitamins, or other pills.   Progress Toward Treatment Goals:  Treatment Goal 03/10/2014  Hemoglobin A1C -  Blood pressure at goal  Prevent falls at goal    Self Care Goals & Plans:  Self Care Goal 10/10/2015  Manage my medications take my medicines as prescribed; bring my medications to every visit; refill my medications on time  Monitor my health keep track of my blood glucose; bring my glucose meter and log to each visit  Eat healthy foods drink diet soda or water instead of juice or soda; eat more vegetables; eat foods that are low in salt; eat baked foods instead of fried foods; eat fruit for snacks and desserts  Be physically active find an activity I enjoy  Meeting treatment goals maintain the current self-care plan    Home Blood Glucose Monitoring 03/10/2014  Check my blood sugar -  When to check my blood sugar before meals     Care Management & Community Referrals:  Referral 11/02/2013  Referrals made for care management support none needed  Referrals made to community resources none

## 2016-08-18 NOTE — Progress Notes (Signed)
Internal Medicine Clinic Attending  Case discussed with Dr. Rivet at the time of the visit.  We reviewed the resident's history and exam and pertinent patient test results.  I agree with the assessment, diagnosis, and plan of care documented in the resident's note.  

## 2016-08-18 NOTE — Assessment & Plan Note (Signed)
BP remains elevated. Patient is also reporting high blood pressures at home. Will increase her amlodipine to 10 mg daily and continue lisinopril 40 mg daily. She will follow up in 2 weeks for BP reassessment.

## 2016-08-25 ENCOUNTER — Ambulatory Visit (INDEPENDENT_AMBULATORY_CARE_PROVIDER_SITE_OTHER): Payer: Medicare Other | Admitting: Internal Medicine

## 2016-08-25 VITALS — BP 161/71 | HR 87 | Temp 98.5°F | Ht 59.0 in | Wt 160.7 lb

## 2016-08-25 DIAGNOSIS — I152 Hypertension secondary to endocrine disorders: Secondary | ICD-10-CM | POA: Diagnosis not present

## 2016-08-25 DIAGNOSIS — Z79899 Other long term (current) drug therapy: Secondary | ICD-10-CM | POA: Diagnosis not present

## 2016-08-25 DIAGNOSIS — I10 Essential (primary) hypertension: Principal | ICD-10-CM

## 2016-08-25 DIAGNOSIS — E1159 Type 2 diabetes mellitus with other circulatory complications: Secondary | ICD-10-CM

## 2016-08-25 MED ORDER — CHLORTHALIDONE 25 MG PO TABS: 25.0000 mg | ORAL_TABLET | Freq: Every day | ORAL | 2 refills | Status: DC

## 2016-08-25 NOTE — Progress Notes (Signed)
   CC: Hypertension follow up  HPI:  [name] is a 71 y.o. woman with PMHx as noted below who presents today for follow up of her hypertension.  BP is elevated at 161/71. She reports she did not take her medications today because she was in a hurry. She reports her BP at home this morning was 131/72, but has been high the past 2 days with readings in the 676H systolic. She is taking Amlodipine 10 mg daily and Lisinopril 40 mg daily. She denies missed doses besides this morning.   Past Medical History:  Diagnosis Date  . Allergy   . Anemia   . Breast CA ([place])    (Rt) breast ca dx 4/12---  S/P RADICAL MASTECTOMY AND CHEMORADIATION  . Diabetes mellitus ORAL MED  . Fatty liver disease, nonalcoholic   . History of hyperkalemia   . Hx antineoplastic chemo  10/12 - 06/24/11   PACLITAX EL COMPLETED 06/24/11  . Hyperlipidemia   . Hypertension   . Mild nonproliferative diabetic retinopathy of right eye ([place]) 03/19/11   Dr. Joseph Art  . Numbness and tingling    Hx: of in fingers and toes since chemotherapy  . OA (osteoarthritis) of knee    RIGHT LEG  . Obesity   . Renal lesion     Review of Systems:   All negative except per HPI  Physical Exam:  Vitals:   08/25/16 0928  BP: (!) 161/71  Pulse: 87  Temp: 98.5 F (36.9 C)  TempSrc: Oral  SpO2: 100%  Weight: 160 lb 11.2 oz (72.9 kg)  Height: 4\' 11"  (1.499 m)   General: Elderly woman in NAD CV: RRR, no m/g/r  Assessment & Plan:   See Encounters Tab for problem based charting.  Patient discussed with Dr. Lynnae January

## 2016-08-25 NOTE — Patient Instructions (Signed)
General Instructions: - Start Chlorthalidone 25 mg daily - Continue Lisinopril and Amlodipine - Follow up in 2 weeks for BP recheck and blood work - Please bring log of home BPs  Please bring your medicines with you each time you come to clinic.  Medicines may include prescription medications, over-the-counter medications, herbal remedies, eye drops, vitamins, or other pills.   Progress Toward Treatment Goals:  Treatment Goal 03/10/2014  Hemoglobin A1C -  Blood pressure at goal  Prevent falls at goal    Self Care Goals & Plans:  Self Care Goal 10/10/2015  Manage my medications take my medicines as prescribed; bring my medications to every visit; refill my medications on time  Monitor my health keep track of my blood glucose; bring my glucose meter and log to each visit  Eat healthy foods drink diet soda or water instead of juice or soda; eat more vegetables; eat foods that are low in salt; eat baked foods instead of fried foods; eat fruit for snacks and desserts  Be physically active find an activity I enjoy  Meeting treatment goals maintain the current self-care plan    Home Blood Glucose Monitoring 03/10/2014  Check my blood sugar -  When to check my blood sugar before meals     Care Management & Community Referrals:  Referral 11/02/2013  Referrals made for care management support none needed  Referrals made to community resources none

## 2016-08-25 NOTE — Assessment & Plan Note (Signed)
BP elevated today (although did not take medications this morning) and also at home. Her BPs tend to run in the 254C systolic per her report. Will have her start Chlorthalidone 25 mg daily. She was recently hospitalized for pancreatitis which was attributed to HCTZ and Lisinopril as she did not have hypertriglyceridemia, no gallstones present, and IgG4 negative for autoimmune pancreatitis. She has been restarted on Lisinopril and tolerating this well. Chlorthalidone has a much lower risk of pancreatitis than HCTZ. She was advised to continue Amlodipine 10 mg daily and Lisinopril 40 mg daily. She was recommended to check her BPs at home and record on a log. She will follow up in 2 weeks for BP recheck and bmet.

## 2016-08-25 NOTE — Progress Notes (Signed)
Internal Medicine Clinic Attending  Case discussed with Dr. Rivet soon after the resident saw the patient.  We reviewed the resident's history and exam and pertinent patient test results.  I agree with the assessment, diagnosis, and plan of care documented in the resident's note.  

## 2016-09-08 ENCOUNTER — Encounter: Payer: Self-pay | Admitting: Internal Medicine

## 2016-09-08 ENCOUNTER — Ambulatory Visit (INDEPENDENT_AMBULATORY_CARE_PROVIDER_SITE_OTHER): Payer: Medicare Other | Admitting: Internal Medicine

## 2016-09-08 DIAGNOSIS — Z8719 Personal history of other diseases of the digestive system: Secondary | ICD-10-CM | POA: Diagnosis not present

## 2016-09-08 DIAGNOSIS — E1159 Type 2 diabetes mellitus with other circulatory complications: Secondary | ICD-10-CM | POA: Diagnosis not present

## 2016-09-08 DIAGNOSIS — I10 Essential (primary) hypertension: Principal | ICD-10-CM

## 2016-09-08 DIAGNOSIS — I152 Hypertension secondary to endocrine disorders: Secondary | ICD-10-CM

## 2016-09-08 NOTE — Patient Instructions (Signed)
Ms. Roddy,  It was a pleasure to see you today. Great job with your blood pressure log. Please continue to check you blood pressure 1-2 times a week. I would like for you to follow up with your primary care doctor in 1 month for blood pressure follow up. Please bring your log with you to that appointment. Please continue to take all of your mediations as prescribed. If you have any questions or concerns, call our clinic at [phone] or after hours call [phone] and ask for the internal medicine resident on call. Thank you!  - Dr. Philipp Ovens

## 2016-09-08 NOTE — Assessment & Plan Note (Addendum)
Patient is here today for blood pressure follow-up. Patient was recently hospitalized for acute pancreatitis felt to be medication induced from her HCTZ and possibly lisinopril. Since her discharge, she has since been restarted on lisinopril 40 mg daily which she is tolerating well without issue. Her hydrochlorothiazide was changed to chlorthalidone 25 mg daily at her last office visit in order to reduce the risk of pancreatitis. She is also taking amlodipine 10 mg daily. Her blood pressure today is 119/66 but she reports she has not yet taken her blood pressure medication. I stressed the importance of taking her medication prior to her office visits so we can properly manage her antihypertensives. Patient had not taken them at her prior office visit either. She did bring her home blood pressure log with her today. Her blood pressure readings range from 120s/70s - 140s/80s, however many of her elevated blood pressure readings were taken prior to her taking her morning meds. I encouraged her to keep up the good work with checking her home blood pressure measurements. I have asked her to continue checking her blood pressure at home about twice a week after taking her antihypertensives. Instructed her to follow-up in one month with her PCP for further management. -- Continue lisinopril 40 mg daily -- Continue amlodipine 10 mg daily -- Continue chlorthalidone 25 mg daily -- F/u PCP 1 month

## 2016-09-08 NOTE — Progress Notes (Signed)
   CC: BP follow up     HPI:  [name] is a 71 y.o. female with past medical history outlined below here for BP follow up. For the details of today's visit, please refer to the assessment and plan.  Past Medical History:  Diagnosis Date  . Allergy   . Anemia   . Breast CA ([place])    (Rt) breast ca dx 4/12---  S/P RADICAL MASTECTOMY AND CHEMORADIATION  . Diabetes mellitus ORAL MED  . Fatty liver disease, nonalcoholic   . History of hyperkalemia   . Hx antineoplastic chemo  10/12 - 06/24/11   PACLITAX EL COMPLETED 06/24/11  . Hyperlipidemia   . Hypertension   . Mild nonproliferative diabetic retinopathy of right eye ([place]) 03/19/11   Dr. Joseph Art  . Numbness and tingling    Hx: of in fingers and toes since chemotherapy  . OA (osteoarthritis) of knee    RIGHT LEG  . Obesity   . Renal lesion     Review of Systems:  All pertinents listed in HPI, otherwise negative  Physical Exam:  Vitals:   09/08/16 1009  BP: 119/66  Pulse: 92  Temp: 98.4 F (36.9 C)  TempSrc: Oral  SpO2: 100%  Weight: 159 lb 6.4 oz (72.3 kg)    Constitutional: NAD, appears comfortable Cardiovascular: RRR, no murmurs, rubs, or gallops.  Pulmonary/Chest: CTAB, no wheezes, rales, or rhonchi.  Abdominal: Soft, non tender, non distended. +BS.  Extremities: Warm and well perfused. No edema.  Neurological: A&Ox3, CN II - XII grossly intact.    Assessment & Plan:   See Encounters Tab for problem based charting.  Patient discussed with Dr. Angelia Mould

## 2016-09-10 NOTE — Progress Notes (Signed)
Internal Medicine Clinic Attending  Case discussed with Dr. Guilloud at the time of the visit.  We reviewed the resident's history and exam and pertinent patient test results.  I agree with the assessment, diagnosis, and plan of care documented in the resident's note.  

## 2016-09-27 ENCOUNTER — Emergency Department (HOSPITAL_COMMUNITY): Payer: Medicare Other

## 2016-09-27 ENCOUNTER — Emergency Department (HOSPITAL_COMMUNITY)
Admission: EM | Admit: 2016-09-27 | Discharge: 2016-09-27 | Disposition: A | Discharge status: Home / Self Care | Payer: Medicare Other | Attending: Emergency Medicine | Admitting: Emergency Medicine

## 2016-09-27 ENCOUNTER — Encounter (HOSPITAL_COMMUNITY): Payer: Self-pay

## 2016-09-27 DIAGNOSIS — I10 Essential (primary) hypertension: Secondary | ICD-10-CM | POA: Diagnosis not present

## 2016-09-27 DIAGNOSIS — Z7984 Long term (current) use of oral hypoglycemic drugs: Secondary | ICD-10-CM | POA: Insufficient documentation

## 2016-09-27 DIAGNOSIS — E876 Hypokalemia: Secondary | ICD-10-CM | POA: Insufficient documentation

## 2016-09-27 DIAGNOSIS — Z96651 Presence of right artificial knee joint: Secondary | ICD-10-CM | POA: Insufficient documentation

## 2016-09-27 DIAGNOSIS — E11319 Type 2 diabetes mellitus with unspecified diabetic retinopathy without macular edema: Secondary | ICD-10-CM | POA: Diagnosis not present

## 2016-09-27 DIAGNOSIS — Z853 Personal history of malignant neoplasm of breast: Secondary | ICD-10-CM | POA: Diagnosis not present

## 2016-09-27 DIAGNOSIS — R1031 Right lower quadrant pain: Secondary | ICD-10-CM | POA: Diagnosis not present

## 2016-09-27 DIAGNOSIS — K5780 Diverticulitis of intestine, part unspecified, with perforation and abscess without bleeding: Secondary | ICD-10-CM | POA: Diagnosis not present

## 2016-09-27 DIAGNOSIS — K5732 Diverticulitis of large intestine without perforation or abscess without bleeding: Secondary | ICD-10-CM

## 2016-09-27 DIAGNOSIS — K5730 Diverticulosis of large intestine without perforation or abscess without bleeding: Secondary | ICD-10-CM | POA: Diagnosis not present

## 2016-09-27 DIAGNOSIS — E1129 Type 2 diabetes mellitus with other diabetic kidney complication: Secondary | ICD-10-CM | POA: Insufficient documentation

## 2016-09-27 DIAGNOSIS — R109 Unspecified abdominal pain: Secondary | ICD-10-CM | POA: Diagnosis present

## 2016-09-27 LAB — URINALYSIS, ROUTINE W REFLEX MICROSCOPIC
Bacteria, UA: NONE SEEN
Bilirubin Urine: NEGATIVE
Glucose, UA: NEGATIVE mg/dL
Hgb urine dipstick: NEGATIVE
Ketones, ur: NEGATIVE mg/dL
Nitrite: NEGATIVE
Protein, ur: NEGATIVE mg/dL
Specific Gravity, Urine: 1.01 (ref 1.005–1.030)
pH: 7 (ref 5.0–8.0)

## 2016-09-27 LAB — CBC
HCT: 33.3 % — ABNORMAL LOW (ref 36.0–46.0)
Hemoglobin: 11 g/dL — ABNORMAL LOW (ref 12.0–15.0)
MCH: 29.9 pg (ref 26.0–34.0)
MCHC: 33 g/dL (ref 30.0–36.0)
MCV: 90.5 fL (ref 78.0–100.0)
Platelets: 211 10*3/uL (ref 150–400)
RBC: 3.68 MIL/uL — ABNORMAL LOW (ref 3.87–5.11)
RDW: 16.1 % — ABNORMAL HIGH (ref 11.5–15.5)
WBC: 7.9 10*3/uL (ref 4.0–10.5)

## 2016-09-27 LAB — COMPREHENSIVE METABOLIC PANEL
ALT: 21 U/L (ref 14–54)
AST: 24 U/L (ref 15–41)
Albumin: 4.4 g/dL (ref 3.5–5.0)
Alkaline Phosphatase: 79 U/L (ref 38–126)
Anion gap: 13 (ref 5–15)
BUN: 18 mg/dL (ref 6–20)
CO2: 27 mmol/L (ref 22–32)
Calcium: 9.7 mg/dL (ref 8.9–10.3)
Chloride: 99 mmol/L — ABNORMAL LOW (ref 101–111)
Creatinine, Ser: 0.98 mg/dL (ref 0.44–1.00)
GFR calc Af Amer: >60 mL/min (ref >60)
GFR calc non Af Amer: 58 mL/min — ABNORMAL LOW (ref >60)
Glucose, Bld: 158 mg/dL — ABNORMAL HIGH (ref 65–99)
Potassium: 3 mmol/L — ABNORMAL LOW (ref 3.5–5.1)
Sodium: 139 mmol/L (ref 135–145)
Total Bilirubin: 0.6 mg/dL (ref 0.3–1.2)
Total Protein: 6.8 g/dL (ref 6.5–8.1)

## 2016-09-27 LAB — LIPASE, BLOOD: Lipase: 51 U/L (ref 11–51)

## 2016-09-27 MED ORDER — POTASSIUM CHLORIDE CRYS ER 20 MEQ PO TBCR
40.0000 meq | EXTENDED_RELEASE_TABLET | Freq: Once | ORAL | Status: AC
  Administered 2016-09-27: 40 meq via ORAL
  Filled 2016-09-27: qty 2

## 2016-09-27 MED ORDER — CIPROFLOXACIN HCL 500 MG PO TABS: 500.0000 mg | ORAL_TABLET | Freq: Two times a day (BID) | ORAL | 0 refills | Status: DC

## 2016-09-27 MED ORDER — OXYCODONE-ACETAMINOPHEN 5-325 MG PO TABS: 1.0000 | ORAL_TABLET | ORAL | 0 refills | Status: DC | PRN

## 2016-09-27 MED ORDER — ONDANSETRON 4 MG PO TBDP: 4.0000 mg | ORAL_TABLET | Freq: Three times a day (TID) | ORAL | 0 refills | Status: DC | PRN

## 2016-09-27 MED ORDER — METRONIDAZOLE 500 MG PO TABS: 500.0000 mg | ORAL_TABLET | Freq: Two times a day (BID) | ORAL | 0 refills | Status: DC

## 2016-09-27 MED ORDER — IOPAMIDOL (ISOVUE-300) INJECTION 61%
INTRAVENOUS | Status: AC
  Administered 2016-09-27: 100 mL
  Filled 2016-09-27: qty 100

## 2016-09-27 NOTE — Discharge Instructions (Signed)
As we discussed, your CT scan today showed diverticulitis. This should resolve with antibiotics. You'll need to follow-up with your primary care doctor to ensure this fully resolves. Should you have any worsening pain, trouble taking her medications, vomiting, high fever, etc. you should return to the ED for repeat evaluation.

## 2016-09-27 NOTE — ED Provider Notes (Signed)
[facility] DEPT Provider Note   CSN: [id number] Arrival date & time: 09/27/16  0151     History   Chief Complaint Chief Complaint  Patient presents with  . Abdominal Pain    HPI Markeria Goetsch is a 71 y.o. female.  The history is provided by the patient and medical records.  Abdominal Pain   Associated symptoms include nausea.    71 y.o. F with hx of breast cancer, diabetes, fatty liver, HLP, HTN, retinopathy of eyes, OA, renal lesions, presenting to the ED for abdominal pain.  States she has had some ongoing "stomach issues" since her bout with pancreatitis in February 2018.  States she often gets an upset stomach after eating certain foods that she was told not to eat.  States she eats them because she does not have the financial resources to buy healthy foods all the time.  States occasionally she will have some nausea but denies any vomiting or diarrhea. She's not had any fever or chills. No sick contacts. States pain feels like her prior pancreatitis pain.  Prior abdominal surgeries include hysterectomy.  She has not tried any medications for her symptoms.  Current pain 1/10.  Past Medical History:  Diagnosis Date  . Allergy   . Anemia   . Breast CA ([place])    (Rt) breast ca dx 4/12---  S/P RADICAL MASTECTOMY AND CHEMORADIATION  . Diabetes mellitus ORAL MED  . Fatty liver disease, nonalcoholic   . History of hyperkalemia   . Hx antineoplastic chemo  10/12 - 06/24/11   PACLITAX EL COMPLETED 06/24/11  . Hyperlipidemia   . Hypertension   . Mild nonproliferative diabetic retinopathy of right eye ([place]) 03/19/11   Dr. Joseph Art  . Numbness and tingling    Hx: of in fingers and toes since chemotherapy  . OA (osteoarthritis) of knee    RIGHT LEG  . Obesity   . Renal lesion     Patient Active Problem List   Diagnosis Date Noted  . Pancreatitis 07/01/2016  . Nausea with vomiting 01/15/2016  . Constipation 01/15/2016  . Allergic urticaria 12/21/2015  . Poor  appetite 11/27/2015  . Ganglion cyst 07/12/2015  . Piriformis syndrome of right side 03/02/2014  . Osteopenia 12/13/2013  . Vaginal dryness 12/13/2013  . Peripheral neuropathy 11/02/2013  . Bilateral hand numbness 09/02/2013  . Health care maintenance 11/17/2012  . Infection of prosthetic knee joint ([place]) 09/03/2012  . Arthritis of knee, right 08/16/2012  . Breast cancer, right breast ([place]) 09/25/2010  . COLONIC POLYPS, HX OF 07/19/2007  . OBESITY NOS 07/09/2006  . Type 2 diabetes mellitus with renal manifestations ([place]) 03/10/2006  . Hyperlipidemia 03/10/2006  . Hypertension associated with diabetes ([place]) 03/10/2006  . ALLERGIC RHINITIS 03/10/2006  . Fatty liver 03/10/2006    Past Surgical History:  Procedure Laterality Date  . COLONOSCOPY W/ POLYPECTOMY     Hx; of  . I&D KNEE WITH POLY EXCHANGE Right 09/03/2012   Procedure: IRRIGATION AND DEBRIDEMENT RIGHT KNEE WITH POLY EXCHANGE;  Surgeon: Kerin Salen, MD;  Location: [facility];  Service: Orthopedics;  Laterality: Right;  . knee arthroscopy  10-28-1999   RIGHT  . Laparoscopy with laparoscopic right salpingo oophorectomy and lysis of pelvic and abdominal adhesions  March 2013   Dr. Nori Riis   . MASTECTOMY MODIFIED RADICAL  12-04-2010   W/ LEFT PAC PLACEMENT (RIGHT BREAST W/ AXILLARY CONTENTS/ NODE BX'S  . PORT-A-CATH REMOVAL  09/24/2011   Procedure: REMOVAL PORT-A-CATH;  Surgeon: Joyice Faster. Cornett,  MD;  Location: [facility];  Service: General;  Laterality: N/A;  . PORTACATH PLACEMENT     REPLACED PAC DUE TO MALFUNCTION (LEFT)  . TOTAL KNEE ARTHROPLASTY Right 08/16/2012   Procedure: TOTAL KNEE ARTHROPLASTY;  Surgeon: Kerin Salen, MD;  Location: [facility];  Service: Orthopedics;  Laterality: Right;  . TRANSTHORACIC ECHOCARDIOGRAM  10-29-2010   NORMAL LVF/ EF 55-60%/ MILD MV REGURG  . TUBAL LIGATION  YRS AGO  . VAGINAL HYSTERECTOMY  AGE 22   W/ LSO    OB History    Gravida Para Term Preterm AB Living   4 4 4     4    SAB TAB Ectopic  Multiple Live Births                   Home Medications    Prior to Admission medications   Medication Sig Start Date End Date Taking? Authorizing Provider  amLODipine (NORVASC) 10 MG tablet Take 1 tablet (10 mg total) by mouth daily. 08/18/16 08/18/17  Rivet, Sindy Guadeloupe, MD  anastrozole (ARIMIDEX) 1 MG tablet Take 1 tablet (1 mg total) by mouth daily. 01/04/16   [name], Virgie Dad, MD  atorvastatin (LIPITOR) 40 MG tablet TAKE 1 TABLET(40 MG) BY MOUTH AT BEDTIME 08/09/16   Holley Raring, MD  chlorthalidone (HYGROTON) 25 MG tablet Take 1 tablet (25 mg total) by mouth daily. 08/25/16   Rivet, Sindy Guadeloupe, MD  gabapentin (NEURONTIN) 100 MG capsule TAKE ONE CAPSULE BY MOUTH THREE TIMES DAILY 08/18/16   [name], Virgie Dad, MD  glipiZIDE (GLUCOTROL) 5 MG tablet TAKE 1 TABLET(5 MG) BY MOUTH TWICE DAILY BEFORE A MEAL 08/09/16   Holley Raring, MD  hydrOXYzine (ATARAX/VISTARIL) 25 MG tablet Take 1 tablet (25 mg total) by mouth 3 (three) times daily as needed. 01/15/16   Asencion Partridge, MD  lisinopril (PRINIVIL,ZESTRIL) 40 MG tablet Take 1 tablet (40 mg total) by mouth daily. 08/04/16   Maryellen Pile, MD  metFORMIN (GLUCOPHAGE) 500 MG tablet TAKE 1 TABLET(500 MG) BY MOUTH TWICE DAILY WITH A MEAL 08/09/16   Holley Raring, MD  Polyvinyl Alcohol-Povidone (REFRESH OP) Place 2 drops into both eyes as needed (for dry eyes).    [provider]    Family History Family History  Problem Relation Age of Onset  . Stroke Father   . Heart disease Sister   . Cancer Sister        breast  . Heart disease Brother   . Cancer Sister        COLON CANCER    Social History Social History  Substance Use Topics  . Smoking status: Never Smoker  . Smokeless tobacco: Never Used  . Alcohol use No     Allergies   Hctz [hydrochlorothiazide] and Metformin   Review of Systems Review of Systems  Gastrointestinal: Positive for abdominal pain and nausea.  All other systems reviewed and are negative.    Physical  Exam Updated Vital Signs BP 116/66 (BP Location: Left Arm)   Pulse 89   Temp 99 F (37.2 C)   Resp 16   SpO2 100%   Physical Exam  Constitutional: [name], place, and time. She appears well-developed and well-nourished.  HENT:  Head: Normocephalic and atraumatic.  Mouth/Throat: Oropharynx is clear and moist.  Eyes: Conjunctivae and EOM are normal. Pupils are equal, round, and reactive to light.  Neck: Normal range of motion.  Cardiovascular: Normal rate, regular rhythm and normal heart sounds.   Pulmonary/Chest: Effort  normal and breath sounds normal. No respiratory distress.  Abdominal: Soft. Bowel sounds are normal. There is tenderness in the left lower quadrant.  TTP in LLQ, no rebound or guarding  Musculoskeletal: Normal range of motion.  Neurological: She is alert and oriented to person, place, and time.  Skin: Skin is warm and dry.  Psychiatric: She has a normal mood and affect.  Nursing note and vitals reviewed.    ED Treatments / Results  Labs (all labs ordered are listed, but only abnormal results are displayed) Labs Reviewed  COMPREHENSIVE METABOLIC PANEL - Abnormal; Notable for the following:       Result Value   Potassium 3.0 (*)    Chloride 99 (*)    Glucose, Bld 158 (*)    GFR calc non Af Amer 58 (*)    All other components within normal limits  CBC - Abnormal; Notable for the following:    RBC 3.68 (*)    Hemoglobin 11.0 (*)    HCT 33.3 (*)    RDW 16.1 (*)    All other components within normal limits  URINALYSIS, ROUTINE W REFLEX MICROSCOPIC - Abnormal; Notable for the following:    Color, Urine STRAW (*)    Leukocytes, UA MODERATE (*)    Squamous Epithelial / LPF 0-5 (*)    All other components within normal limits  LIPASE, BLOOD    EKG  EKG Interpretation None       Radiology Ct Abdomen Pelvis W Contrast  Result Date: 09/27/2016 CLINICAL DATA:  Left lower quadrant pain. History of diverticulitis. EXAM: CT ABDOMEN AND  PELVIS WITH CONTRAST TECHNIQUE: Multidetector CT imaging of the abdomen and pelvis was performed using the standard protocol following bolus administration of intravenous contrast. CONTRAST:  117mL ISOVUE-300 IOPAMIDOL (ISOVUE-300) INJECTION 61% COMPARISON:  July 01, 2016 FINDINGS: Lower chest: Small hiatal hernia. The lung bases are otherwise normal. Hepatobiliary: Hepatic steatosis. The liver and gallbladder are otherwise normal. The portal vein is patent. Pancreas: Unremarkable. No pancreatic ductal dilatation or surrounding inflammatory changes. Spleen: Normal in size without focal abnormality. Adrenals/Urinary Tract: Adrenal glands are unremarkable. Kidneys are normal, without renal calculi, focal lesion, or hydronephrosis. Bladder is unremarkable. Stomach/Bowel: The stomach and small bowel are normal. Diverticulitis is seen adjacent to the proximal sigmoid colon with no abscess or extraluminal gas. The remainder of the colon is unremarkable. The appendix is normal. Vascular/Lymphatic: Atherosclerotic change is seen in the non aneurysmal aorta. No adenopathy. Reproductive: Status post hysterectomy. No adnexal masses. Other: No free air free fluid. A fat containing umbilical hernia is identified. Musculoskeletal: Grade 1 anterolisthesis of L4 versus L5 is stable. No acute bony abnormalities. IMPRESSION: 1. Diverticulitis associated with the proximal sigmoid colon with no abscess, perforation, or extraluminal gas identified. 2. Atherosclerosis in the non aneurysmal aorta. 3. No other acute abnormalities. Electronically Signed   By: Dorise Bullion III M.D   On: 09/27/2016 08:22    Procedures Procedures (including critical care time)  Medications Ordered in ED Medications - No data to display   Initial Impression / Assessment and Plan / ED Course  I have reviewed the triage vital signs and the nursing notes.  Pertinent labs & imaging results that were available during my care of the patient were  reviewed by me and considered in my medical decision making (see chart for details).  71 year old female here with left lower abdominal pain. Has been ongoing for a few days now. Somewhat worsened by eating. She is afebrile and nontoxic. On  exam she does have tenderness in left lower quadrant without rebound or guarding. Screen labwork is overall reassuring aside from a mild hypokalemia. This may be secondary to her diuretics. Will be replaced here. CT scan was obtained revealing uncomplicated diverticulitis. Patient has not required any pain or nausea medications here and remained stable. Feel she is appropriate for outpatient treatment. Will start on Cipro/Flagyl. Will have her follow-up closely with her primary care doctor.  Discussed plan with patient, she acknowledged understanding and agreed with plan of care.  Return precautions given for new or worsening symptoms.  Final Clinical Impressions(s) / ED Diagnoses   Final diagnoses:  Diverticulitis large intestine w/o perforation or abscess w/o bleeding  Hypokalemia    New Prescriptions Discharge Medication List as of 09/27/2016  8:42 AM    START taking these medications   Details  ciprofloxacin (CIPRO) 500 MG tablet Take 1 tablet (500 mg total) by mouth every 12 (twelve) hours., Starting Sat 09/27/2016, Print    metroNIDAZOLE (FLAGYL) 500 MG tablet Take 1 tablet (500 mg total) by mouth 2 (two) times daily., Starting Sat 09/27/2016, Print    ondansetron (ZOFRAN ODT) 4 MG disintegrating tablet Take 1 tablet (4 mg total) by mouth every 8 (eight) hours as needed for nausea., Starting Sat 09/27/2016, Print    oxyCODONE-acetaminophen (PERCOCET) 5-325 MG tablet Take 1 tablet by mouth every 4 (four) hours as needed., Starting Sat 09/27/2016, Print         Larene Pickett, PA-C 09/27/16 Gu-Win, April, MD 09/27/16 2321

## 2016-09-27 NOTE — ED Triage Notes (Signed)
Pt comlpaining of L sided abdominal pain. Pt states hx of pancreatitis. States feels same. Pt denies any N/V/D.  Pt denies any vaginal bleeding or discharge. Pt denies any urinary symptoms.

## 2016-10-10 ENCOUNTER — Encounter: Payer: Self-pay | Admitting: *Deleted

## 2016-10-16 ENCOUNTER — Other Ambulatory Visit: Payer: Self-pay | Admitting: Internal Medicine

## 2016-10-16 DIAGNOSIS — I152 Hypertension secondary to endocrine disorders: Secondary | ICD-10-CM

## 2016-10-16 DIAGNOSIS — I10 Essential (primary) hypertension: Principal | ICD-10-CM

## 2016-10-16 DIAGNOSIS — E1159 Type 2 diabetes mellitus with other circulatory complications: Secondary | ICD-10-CM

## 2016-10-19 ENCOUNTER — Other Ambulatory Visit: Payer: Self-pay | Admitting: Internal Medicine

## 2016-10-19 DIAGNOSIS — I10 Essential (primary) hypertension: Principal | ICD-10-CM

## 2016-10-19 DIAGNOSIS — E1159 Type 2 diabetes mellitus with other circulatory complications: Secondary | ICD-10-CM

## 2016-10-19 DIAGNOSIS — I152 Hypertension secondary to endocrine disorders: Secondary | ICD-10-CM

## 2016-11-08 ENCOUNTER — Other Ambulatory Visit: Payer: Self-pay | Admitting: Internal Medicine

## 2016-11-08 DIAGNOSIS — E785 Hyperlipidemia, unspecified: Secondary | ICD-10-CM

## 2016-12-15 ENCOUNTER — Other Ambulatory Visit: Payer: Self-pay | Admitting: *Deleted

## 2016-12-15 MED ORDER — AMLODIPINE BESYLATE 10 MG PO TABS: 10.0000 mg | ORAL_TABLET | Freq: Every day | ORAL | 11 refills | Status: DC

## 2016-12-15 NOTE — Telephone Encounter (Signed)
Needs Aug, Sept, Oct appt PCP DM and HTN F/U

## 2016-12-29 ENCOUNTER — Ambulatory Visit
Admission: RE | Admit: 2016-12-29 | Discharge: 2016-12-29 | Disposition: A | Discharge status: Home / Self Care | Payer: Medicare Other | Source: Ambulatory Visit | Attending: Adult Health | Admitting: Adult Health

## 2016-12-29 DIAGNOSIS — Z1231 Encounter for screening mammogram for malignant neoplasm of breast: Secondary | ICD-10-CM

## 2016-12-30 ENCOUNTER — Other Ambulatory Visit: Payer: Self-pay | Admitting: *Deleted

## 2016-12-30 DIAGNOSIS — E1121 Type 2 diabetes mellitus with diabetic nephropathy: Secondary | ICD-10-CM

## 2016-12-30 MED ORDER — GLIPIZIDE 5 MG PO TABS: ORAL_TABLET | ORAL | 0 refills | Status: DC

## 2017-01-02 ENCOUNTER — Other Ambulatory Visit: Payer: Self-pay | Admitting: Oncology

## 2017-01-02 DIAGNOSIS — C50911 Malignant neoplasm of unspecified site of right female breast: Secondary | ICD-10-CM

## 2017-01-06 ENCOUNTER — Other Ambulatory Visit: Payer: Self-pay

## 2017-01-06 ENCOUNTER — Ambulatory Visit (HOSPITAL_BASED_OUTPATIENT_CLINIC_OR_DEPARTMENT_OTHER): Payer: Medicare Other | Admitting: Oncology

## 2017-01-06 ENCOUNTER — Other Ambulatory Visit (HOSPITAL_BASED_OUTPATIENT_CLINIC_OR_DEPARTMENT_OTHER): Payer: Medicare Other

## 2017-01-06 VITALS — BP 141/73 | HR 85 | Temp 98.1°F | Resp 17 | Ht 59.0 in | Wt 158.1 lb

## 2017-01-06 DIAGNOSIS — Z170 Estrogen receptor positive status [ER+]: Secondary | ICD-10-CM

## 2017-01-06 DIAGNOSIS — C50911 Malignant neoplasm of unspecified site of right female breast: Secondary | ICD-10-CM | POA: Diagnosis not present

## 2017-01-06 DIAGNOSIS — Z79811 Long term (current) use of aromatase inhibitors: Secondary | ICD-10-CM | POA: Diagnosis not present

## 2017-01-06 LAB — CBC WITH DIFFERENTIAL/PLATELET
BASO%: 0.7 % (ref 0.0–2.0)
Basophils Absolute: 0 10*3/uL (ref 0.0–0.1)
EOS%: 1.1 % (ref 0.0–7.0)
Eosinophils Absolute: 0.1 10*3/uL (ref 0.0–0.5)
HCT: 32.5 % — ABNORMAL LOW (ref 34.8–46.6)
HGB: 10.9 g/dL — ABNORMAL LOW (ref 11.6–15.9)
LYMPH%: 25.5 % (ref 14.0–49.7)
MCH: 31.7 pg (ref 25.1–34.0)
MCHC: 33.6 g/dL (ref 31.5–36.0)
MCV: 94.2 fL (ref 79.5–101.0)
MONO#: 0.2 10*3/uL (ref 0.1–0.9)
MONO%: 5.5 % (ref 0.0–14.0)
NEUT#: 3 10*3/uL (ref 1.5–6.5)
NEUT%: 67.2 % (ref 38.4–76.8)
Platelets: 188 10*3/uL (ref 145–400)
RBC: 3.44 10*6/uL — ABNORMAL LOW (ref 3.70–5.45)
RDW: 17.7 % — ABNORMAL HIGH (ref 11.2–14.5)
WBC: 4.5 10*3/uL (ref 3.9–10.3)
lymph#: 1.1 10*3/uL (ref 0.9–3.3)

## 2017-01-06 LAB — COMPREHENSIVE METABOLIC PANEL
ALT: 23 U/L (ref 0–55)
AST: 16 U/L (ref 5–34)
Albumin: 4 g/dL (ref 3.5–5.0)
Alkaline Phosphatase: 59 U/L (ref 40–150)
Anion Gap: 10 mEq/L (ref 3–11)
BUN: 13.8 mg/dL (ref 7.0–26.0)
CO2: 27 mEq/L (ref 22–29)
Calcium: 9.6 mg/dL (ref 8.4–10.4)
Chloride: 104 mEq/L (ref 98–109)
Creatinine: 0.9 mg/dL (ref 0.6–1.1)
EGFR: 74 mL/min/{1.73_m2} — ABNORMAL LOW (ref >90)
Glucose: 194 mg/dl — ABNORMAL HIGH (ref 70–140)
Potassium: 3 mEq/L — CL (ref 3.5–5.1)
Sodium: 141 mEq/L (ref 136–145)
Total Bilirubin: 0.48 mg/dL (ref 0.20–1.20)
Total Protein: 6.9 g/dL (ref 6.4–8.3)

## 2017-01-06 MED ORDER — POTASSIUM CHLORIDE ER 10 MEQ PO TBCR: 10.0000 meq | EXTENDED_RELEASE_TABLET | Freq: Every day | ORAL | 0 refills | Status: DC

## 2017-01-06 NOTE — Telephone Encounter (Signed)
Kdur 21meq daily prescription sen to pharmacy per Dr Jana Hakim.  lvm with pt to return my call regarding new medication.

## 2017-01-06 NOTE — Progress Notes (Signed)
ID: [name]   DOB: Oct 03, 1945  MR#: [id number]  NWG#:[id number]   PCP: Jillian Dredge, MD GYN: [name] SU: [name] OTHER MD: [name], [name]  CHIEF COMPLAINT: Right breast cancer s/p mastectomy   CURRENT THERAPY: Completing 5 years of Anastrozole  BREAST CANCER HISTORY : From the original intake note:  [name] had screening mammography Sep 11, 2010, at [name]. This showed a possible right axillary mass and she was brought back for additional views Sep 20, 2010. An ultrasound showed abnormal enlarged lymph nodes in the right axilla measuring a maximum of 2.2 cm. Biopsy was suggested and performed on Sep 25, 2010. The pathology from that procedure ([id number]) showed metastatic adenocarcinoma consistent with a breast primary. In particular, the tumor was estrogen and progesterone receptor positive, HER2/neu was negative, the MIB-1 was 40%.  With this information, the patient was referred for bilateral breast MRIs performed May 29. This showed a complex picture, there being in addition to 2 pathologically enlarged lymph nodes in the right axilla and then other mildly prominent lymph nodes, 2 masses in the breast itself, one was subareolar and measured 1.3 cm, the second mass was more posterior and inferior and measured 8 mm. The distance between these 2 masses was 4.5 cm. the second breast mass was biopsied and was found to be benign.  She has a deleterious BRCA-2 mutation, namely 6696 deletion TC.  Status post right mastectomy and axillary lymph node dissection December 04, 2010 for a 1.5 cm invasive ductal carcinoma, grade 3, involving 3/12 axillary lymph nodes, with ample margins, T1c N1 M0, estrogen receptor 94% positive, progesterone receptor 4% positive with an MIB-1 of 62% and no HER2 amplification on the original material. Further therapy as detailed below.  INTERVAL HISTORY:  [name] returns today for follow-up and treatment of her estrogen receptor  positive breast cancer. She continues on anastrozole. She tolerates that well.Hot flashes and vaginal dryness are not a major issue. She never developed the arthralgias or myalgias that many patients can experience on this medication. She obtains it at a good price.    REVIEW OF SYSTEMS:  [name] tells me her husband recently fell off a roof when trying to fix it after the recent storm. This is caused a lot of problems for them. Aside from that a detailed review of systems today was stable   PAST MEDICAL HISTORY: Past Medical History:  Diagnosis Date  . Allergy   . Anemia   . Breast CA ([facility])    (Rt) breast ca dx 4/12---  S/P RADICAL MASTECTOMY AND CHEMORADIATION  . Diabetes mellitus ORAL MED  . Fatty liver disease, nonalcoholic   . History of hyperkalemia   . Hx antineoplastic chemo  10/12 - 06/24/11   PACLITAX EL COMPLETED 06/24/11  . Hyperlipidemia   . Hypertension   . Mild nonproliferative diabetic retinopathy of right eye ([facility]) 03/19/11   Dr. Norva Ryan  . Numbness and tingling    Hx: of in fingers and toes since chemotherapy  . OA (osteoarthritis) of knee    RIGHT LEG  . Obesity   . Renal lesion     PAST SURGICAL HISTORY: Past Surgical History:  Procedure Laterality Date  . COLONOSCOPY W/ POLYPECTOMY     Hx; of  . I&D KNEE WITH POLY EXCHANGE Right 09/03/2012   Procedure: IRRIGATION AND DEBRIDEMENT RIGHT KNEE WITH POLY EXCHANGE;  Surgeon: Jillian Lewandowsky, MD;  Location: [facility] OR;  Service: Orthopedics;  Laterality: Right;  . knee  arthroscopy  10-28-1999   RIGHT  . Laparoscopy with laparoscopic right salpingo oophorectomy and lysis of pelvic and abdominal adhesions  March 2013   Dr. Jennette Ryan   . MASTECTOMY MODIFIED RADICAL  12-04-2010   W/ LEFT PAC PLACEMENT (RIGHT BREAST W/ AXILLARY CONTENTS/ NODE BX'S  . PORT-A-CATH REMOVAL  09/24/2011   Procedure: REMOVAL PORT-A-CATH;  Surgeon: [name]. Cornett, MD;  Location: [facility];  Service: General;  Laterality: N/A;  . PORTACATH  PLACEMENT     REPLACED PAC DUE TO MALFUNCTION (LEFT)  . TOTAL KNEE ARTHROPLASTY Right 08/16/2012   Procedure: TOTAL KNEE ARTHROPLASTY;  Surgeon: Jillian Lewandowsky, MD;  Location: [facility] OR;  Service: Orthopedics;  Laterality: Right;  . TRANSTHORACIC ECHOCARDIOGRAM  10-29-2010   NORMAL LVF/ EF 55-60%/ MILD MV REGURG  . TUBAL LIGATION  YRS AGO  . VAGINAL HYSTERECTOMY  AGE 76   W/ LSO    FAMILY HISTORY Family History  Problem Relation Age of Onset  . Stroke Father   . Heart disease Sister   . Cancer Sister        breast  . Heart disease Brother   . Cancer Sister        COLON CANCER  patient's mother alive age 52; father deceased, cause unknown. The patient has 8 sisters and 9 brothers.  GYNECOLOGIC HISTORY: GX, P4, first pregnancy age 89; hysterectomy age 74 with USO; right USO 2013; on premarin >20 years, discontinued at the time of breast cancer diagnosis  SOCIAL HISTORY: retired from office work; husband [name] works as a Curator; 4 daughters, 3 in [place] {[name], [name] and [name]), 1 in [place] ([name]); 3 grandsons; attends [employer].   ADVANCED DIRECTIVES: her husband is her HCPOA  HEALTH MAINTENANCE: Social History  Substance Use Topics  . Smoking status: Never Smoker  . Smokeless tobacco: Never Used  . Alcohol use No     Colonoscopy: Approx 2011  PAP:  UTD   Bone density: August 2013;Young Adult T Score: -1.3  Lipid panel: followed by PCP, UTD  Allergies  Allergen Reactions  . Hctz [Hydrochlorothiazide] Other (See Comments)    Pancreatitis  . Metformin Other (See Comments)    REACTION, diarrhea: 850 mg - diarrhea but tolerates 500 mg daily    Current Outpatient Prescriptions  Medication Sig Dispense Refill  . amLODipine (NORVASC) 10 MG tablet Take 1 tablet (10 mg total) by mouth daily. 30 tablet 11  . anastrozole (ARIMIDEX) 1 MG tablet Take 1 tablet (1 mg total) by mouth daily. 90 tablet 4  . atorvastatin (LIPITOR) 40 MG tablet TAKE 1 TABLET BY  MOUTH EVERY NIGHT AT BEDTIME 90 tablet 3  . chlorthalidone (HYGROTON) 25 MG tablet Take 1 tablet (25 mg total) by mouth daily. 30 tablet 2  . gabapentin (NEURONTIN) 100 MG capsule TAKE ONE CAPSULE BY MOUTH THREE TIMES DAILY 270 capsule 0  . glipiZIDE (GLUCOTROL) 5 MG tablet TAKE 1 TABLET(5 MG) BY MOUTH TWICE DAILY BEFORE A MEAL 180 tablet 0  . hydrochlorothiazide (HYDRODIURIL) 25 MG tablet TAKE 1 TABLET BY MOUTH EVERY DAY 90 tablet 0  . hydrOXYzine (ATARAX/VISTARIL) 25 MG tablet Take 1 tablet (25 mg total) by mouth 3 (three) times daily as needed. 42 tablet 3  . lisinopril (PRINIVIL,ZESTRIL) 40 MG tablet Take 1 tablet (40 mg total) by mouth daily. 90 tablet 2  . metFORMIN (GLUCOPHAGE) 500 MG tablet TAKE 1 TABLET BY MOUTH TWICE DAILY WITH MEALS 180 tablet 3  . metroNIDAZOLE (FLAGYL) 500 MG tablet Take  1 tablet (500 mg total) by mouth 2 (two) times daily. 20 tablet 0  . ondansetron (ZOFRAN ODT) 4 MG disintegrating tablet Take 1 tablet (4 mg total) by mouth every 8 (eight) hours as needed for nausea. 10 tablet 0  . oxyCODONE-acetaminophen (PERCOCET) 5-325 MG tablet Take 1 tablet by mouth every 4 (four) hours as needed. 20 tablet 0  . Polyvinyl Alcohol-Povidone (REFRESH OP) Place 2 drops into both eyes as needed (for dry eyes).     No current facility-administered medications for this visit.     OBJECTIVE: Middle-aged [place] American woman In no acute distress  Vitals:   01/06/17 1118  BP: (!) 141/73  Pulse: 85  Resp: 17  Temp: 98.1 F (36.7 Ryan)  SpO2: 100%     Body mass index is 31.93 kg/m.    ECOG FS: 1 Filed Weights   01/06/17 1118  Weight: 158 lb 1.6 oz (71.7 kg)   Sclerae unicteric, pupils round and equal Oropharynx clear and moist No cervical or supraclavicular adenopathy Lungs no rales or rhonchi Heart regular rate and rhythm Abd soft, nontender, positive bowel sounds MSK no focal spinal tenderness, no upper extremity lymphedema Neuro: nonfocal, well oriented, appropriate  affect Breasts: The right breast is status post mastectomy followed by radiation. There is no evidence of local recurrence. The left breast is benign. Both axillae are benign.  LAB RESULTS: Lab Results  Component Value Date   WBC 4.5 01/06/2017   NEUTROABS 3.0 01/06/2017   HGB 10.9 (L) 01/06/2017   HCT 32.5 (L) 01/06/2017   MCV 94.2 01/06/2017   PLT 188 01/06/2017      Chemistry      Component Value Date/Time   NA 139 09/27/2016 0202   NA 141 08/04/2016 1013   NA 142 01/04/2016 1039   K 3.0 (L) 09/27/2016 0202   K 3.7 01/04/2016 1039   CL 99 (L) 09/27/2016 0202   CL 100 05/27/2012 0947   CO2 27 09/27/2016 0202   CO2 29 01/04/2016 1039   BUN 18 09/27/2016 0202   BUN 14 08/04/2016 1013   BUN 16.7 01/04/2016 1039   CREATININE 0.98 09/27/2016 0202   CREATININE 1.1 01/04/2016 1039   GLU 132 (H) 03/25/2011 0957      Component Value Date/Time   CALCIUM 9.7 09/27/2016 0202   CALCIUM 9.5 01/04/2016 1039   ALKPHOS 79 09/27/2016 0202   ALKPHOS 75 01/04/2016 1039   AST 24 09/27/2016 0202   AST 16 01/04/2016 1039   ALT 21 09/27/2016 0202   ALT 24 01/04/2016 1039   BILITOT 0.6 09/27/2016 0202   BILITOT 0.43 01/04/2016 1039       Lab Results  Component Value Date   LABCA2 12 05/27/2012    STUDIES: Mm Screening Breast Tomo Uni L  Result Date: 12/29/2016 CLINICAL DATA:  Screening. EXAM: 2D DIGITAL SCREENING UNILATERAL LEFT MAMMOGRAM WITH CAD AND ADJUNCT TOMO COMPARISON:  Previous exam(s). ACR Breast Density Category Ryan: The breast tissue is heterogeneously dense, which may obscure small masses. FINDINGS: The patient has had a right mastectomy. There are no findings suspicious for malignancy. Images were processed with CAD. IMPRESSION: No mammographic evidence of malignancy. A result letter of this screening mammogram will be mailed directly to the patient. RECOMMENDATION: Screening mammogram in one year.  (Code:SM-L-65M) BI-RADS CATEGORY  1: Negative. Electronically Signed   By:  [name] M.D.   On: 12/29/2016 12:16     ASSESSMENT:71 y.o.  BRCA-2 positive [employer] woman,  (1)  status post right mastectomy and axillary lymph node dissection August of 2012 for a T1c N1 (Stage IIA) invasive ductal carcinoma, grade 3, estrogen and progesterone receptor positive, HER-2 negative, with an MIB-1-1 of 62%,   (2) Status post 4 cycles of doxorubicin and cyclophosphamide given in dose dense fashion, followed  by weekly paclitaxel x 12 completed 06/24/2011  (3) received post-mastectomy radiation completed April 2013  (4)  started on anastrozole in May 2013, completing a little over 5 years August 2018.  (a) bone density scan on 12/25/2014 was normal with a T score of -0.9.  (5) s/p remote hysterectomy and left salpingo-oophorectomy; s/p right salpingo-oophorectomy March 2013 with benign pathology    PLAN:  [name] is now 6 years out from definitive surgery for her right breast cancer. It is no evidence of disease recurrence. This is very favorable.  She is completing 5 years of anti-estrogens. She has tolerated this well.  At this point I feel comfortable releasing her to her primary care physician. As she will need in terms of breast cancer screening is a left mammogram yearly and a physician breast exam and chest wall exam yearly  I will be glad to see [name] at any point in the future if on when the need arises but as of now we are making no further routine appointment for her here. [name]    01/06/2017

## 2017-01-07 ENCOUNTER — Telehealth: Payer: Self-pay

## 2017-01-07 ENCOUNTER — Telehealth: Payer: Self-pay | Admitting: Oncology

## 2017-01-07 NOTE — Telephone Encounter (Signed)
Per 9/4 los - there was not los at check out.

## 2017-01-07 NOTE — Telephone Encounter (Signed)
Lvm for pt on 9/4 and again today regarding her low potassium level and need for Kdur prescription which has been called in to her pharmacy.  Msg left instructing pt to please return my call to discuss f/u lab work.

## 2017-01-08 ENCOUNTER — Telehealth: Payer: Self-pay

## 2017-01-08 ENCOUNTER — Other Ambulatory Visit: Payer: Self-pay

## 2017-01-08 DIAGNOSIS — E876 Hypokalemia: Secondary | ICD-10-CM

## 2017-01-08 MED FILL — POTASSIUM CL ER 10 MEQ TABL: 10 | 90 days supply | Qty: 90 | Fill #0

## 2017-01-08 NOTE — Telephone Encounter (Signed)
Financial assistance provided for pt's potassium prescription.  Pt instructed to pick up prescription from [facility].  Lab appt made for 9/28 to recheck potassium level.  Pt made aware and verbalizes understanding of instructions.

## 2017-01-08 NOTE — Telephone Encounter (Signed)
Pt returned call leaving vm.  This RN returned call and got vm again.  Left msg with instructions on taking potassium prescription and the need for repeat blood work to f/u on low potassium level.

## 2017-01-08 NOTE — Telephone Encounter (Signed)
lvm for pt to return my call regarding her most recent blood work and need for potassium supplement.

## 2017-01-30 ENCOUNTER — Other Ambulatory Visit (HOSPITAL_BASED_OUTPATIENT_CLINIC_OR_DEPARTMENT_OTHER): Payer: Medicare Other

## 2017-01-30 DIAGNOSIS — E876 Hypokalemia: Secondary | ICD-10-CM

## 2017-01-30 DIAGNOSIS — C50911 Malignant neoplasm of unspecified site of right female breast: Secondary | ICD-10-CM

## 2017-01-30 LAB — COMPREHENSIVE METABOLIC PANEL
ALT: 18 U/L (ref 0–55)
AST: 15 U/L (ref 5–34)
Albumin: 4.2 g/dL (ref 3.5–5.0)
Alkaline Phosphatase: 69 U/L (ref 40–150)
Anion Gap: 11 mEq/L (ref 3–11)
BUN: 13.4 mg/dL (ref 7.0–26.0)
CO2: 26 mEq/L (ref 22–29)
Calcium: 9.6 mg/dL (ref 8.4–10.4)
Chloride: 104 mEq/L (ref 98–109)
Creatinine: 0.9 mg/dL (ref 0.6–1.1)
EGFR: 71 mL/min/{1.73_m2} — ABNORMAL LOW (ref >90)
Glucose: 151 mg/dl — ABNORMAL HIGH (ref 70–140)
Potassium: 3.7 mEq/L (ref 3.5–5.1)
Sodium: 142 mEq/L (ref 136–145)
Total Bilirubin: 0.45 mg/dL (ref 0.20–1.20)
Total Protein: 6.9 g/dL (ref 6.4–8.3)

## 2017-03-04 ENCOUNTER — Encounter (INDEPENDENT_AMBULATORY_CARE_PROVIDER_SITE_OTHER): Payer: Self-pay

## 2017-03-04 ENCOUNTER — Encounter: Payer: Self-pay | Admitting: Internal Medicine

## 2017-03-04 ENCOUNTER — Ambulatory Visit (INDEPENDENT_AMBULATORY_CARE_PROVIDER_SITE_OTHER): Payer: Medicare Other | Admitting: Internal Medicine

## 2017-03-04 VITALS — BP 177/83 | HR 91 | Temp 98.0°F | Wt 157.0 lb

## 2017-03-04 DIAGNOSIS — G4700 Insomnia, unspecified: Secondary | ICD-10-CM | POA: Diagnosis not present

## 2017-03-04 DIAGNOSIS — Z23 Encounter for immunization: Secondary | ICD-10-CM | POA: Diagnosis present

## 2017-03-04 DIAGNOSIS — Z7984 Long term (current) use of oral hypoglycemic drugs: Secondary | ICD-10-CM

## 2017-03-04 DIAGNOSIS — E1129 Type 2 diabetes mellitus with other diabetic kidney complication: Secondary | ICD-10-CM

## 2017-03-04 DIAGNOSIS — E1121 Type 2 diabetes mellitus with diabetic nephropathy: Secondary | ICD-10-CM

## 2017-03-04 DIAGNOSIS — E1159 Type 2 diabetes mellitus with other circulatory complications: Secondary | ICD-10-CM

## 2017-03-04 DIAGNOSIS — N289 Disorder of kidney and ureter, unspecified: Secondary | ICD-10-CM

## 2017-03-04 DIAGNOSIS — R51 Headache: Secondary | ICD-10-CM

## 2017-03-04 DIAGNOSIS — I10 Essential (primary) hypertension: Secondary | ICD-10-CM

## 2017-03-04 DIAGNOSIS — I152 Hypertension secondary to endocrine disorders: Secondary | ICD-10-CM | POA: Diagnosis not present

## 2017-03-04 LAB — POCT GLYCOSYLATED HEMOGLOBIN (HGB A1C): Hemoglobin A1C: 6.3

## 2017-03-04 LAB — GLUCOSE, CAPILLARY: Glucose-Capillary: 165 mg/dL — ABNORMAL HIGH (ref 65–99)

## 2017-03-04 MED ORDER — LISINOPRIL 10 MG PO TABS: 10.0000 mg | ORAL_TABLET | Freq: Every day | ORAL | 2 refills | Status: DC

## 2017-03-04 MED ORDER — CHLORTHALIDONE 25 MG PO TABS: 25.0000 mg | ORAL_TABLET | Freq: Every day | ORAL | 2 refills | Status: DC

## 2017-03-04 NOTE — Assessment & Plan Note (Addendum)
A1c 6.3 today. Controlled with Glipizide 5 mg QD and Metformin 500 mg QD. Her weight is down 6lbs over the past 12 months. She does not follow a strict diet and instead focuses on portion control rather than substance control. She does no exercise daily. Denies diabetic neuropathy. Continues to have good urine output. Has not had a diabetic eye exam.   Plan: - Continue Metformin and Glipizide  - Encouraged her to keep up the good work! - Referral for diabetic eye exam  - DM follow-up in 3-6 months

## 2017-03-04 NOTE — Patient Instructions (Signed)
It was a pleasure to meet you today. Today we made the following changes:  - START taking Lisinopril 10 mg once daily   - START taking Chlorthalidone 25 mg once daily   - Keep up the great work with your diabetes   - Please come back in 1 week for a blood pressure check and some lab work   Hypertension Hypertension is another name for high blood pressure. High blood pressure forces your heart to work harder to pump blood. This can cause problems over time. There are two numbers in a blood pressure reading. There is a top number (systolic) over a bottom number (diastolic). It is best to have a blood pressure below 120/80. Healthy choices can help lower your blood pressure. You may need medicine to help lower your blood pressure if:  Your blood pressure cannot be lowered with healthy choices.  Your blood pressure is higher than 130/80.  Follow these instructions at home: Eating and drinking  If directed, follow the DASH eating plan. This diet includes: ? Filling half of your plate at each meal with fruits and vegetables. ? Filling one quarter of your plate at each meal with whole grains. Whole grains include whole wheat pasta, brown rice, and whole grain bread. ? Eating or drinking low-fat dairy products, such as skim milk or low-fat yogurt. ? Filling one quarter of your plate at each meal with low-fat (lean) proteins. Low-fat proteins include fish, skinless chicken, eggs, beans, and tofu. ? Avoiding fatty meat, cured and processed meat, or chicken with skin. ? Avoiding premade or processed food.  Eat less than 1,500 mg of salt (sodium) a day.  Limit alcohol use to no more than 1 drink a day for nonpregnant women and 2 drinks a day for men. One drink equals 12 oz of beer, 5 oz of wine, or 1 oz of hard liquor. Lifestyle  Work with your doctor to stay at a healthy weight or to lose weight. Ask your doctor what the best weight is for you.  Get at least 30 minutes of exercise that  causes your heart to beat faster (aerobic exercise) most days of the week. This may include walking, swimming, or biking.  Get at least 30 minutes of exercise that strengthens your muscles (resistance exercise) at least 3 days a week. This may include lifting weights or pilates.  Do not use any products that contain nicotine or tobacco. This includes cigarettes and e-cigarettes. If you need help quitting, ask your doctor.  Check your blood pressure at home as told by your doctor.  Keep all follow-up visits as told by your doctor. This is important. Medicines  Take over-the-counter and prescription medicines only as told by your doctor. Follow directions carefully.  Do not skip doses of blood pressure medicine. The medicine does not work as well if you skip doses. Skipping doses also puts you at risk for problems.  Ask your doctor about side effects or reactions to medicines that you should watch for. Contact a doctor if:  You think you are having a reaction to the medicine you are taking.  You have headaches that keep coming back (recurring).  You feel dizzy.  You have swelling in your ankles.  You have trouble with your vision. Get help right away if:  You get a very bad headache.  You start to feel confused.  You feel weak or numb.  You feel faint.  You get very bad pain in your: ? Chest. ? Belly (  abdomen).  You throw up (vomit) more than once.  You have trouble breathing. Summary  Hypertension is another name for high blood pressure.  Making healthy choices can help lower blood pressure. If your blood pressure cannot be controlled with healthy choices, you may need to take medicine. This information is not intended to replace advice given to you by your health care provider. Make sure you discuss any questions you have with your health care provider. Document Released: 10/08/2007 Document Revised: 03/19/2016 Document Reviewed: 03/19/2016 Elsevier Interactive  Patient Education  [employer].

## 2017-03-04 NOTE — Progress Notes (Signed)
   CC: Management of HTN and DM  HPI:  [name] is a 71 y.o. presenting to the internal medicine residency clinic for continued evaluation and management of her chronic type 2 diabetes mellitus and hypertension. Please refer to problem based charting for a detailed evaluation, assessment, and plan.   Past Medical History:  Diagnosis Date  . Allergy   . Anemia   . Breast CA ([place])    (Rt) breast ca dx 4/12---  S/P RADICAL MASTECTOMY AND CHEMORADIATION  . Diabetes mellitus ORAL MED  . Fatty liver disease, nonalcoholic   . History of hyperkalemia   . Hx antineoplastic chemo  10/12 - 06/24/11   PACLITAX EL COMPLETED 06/24/11  . Hyperlipidemia   . Hypertension   . Mild nonproliferative diabetic retinopathy of right eye ([place]) 03/19/11   Dr. Joseph Art  . Numbness and tingling    Hx: of in fingers and toes since chemotherapy  . OA (osteoarthritis) of knee    RIGHT LEG  . Obesity   . Renal lesion    Review of Systems:  General: + HA and insomnia, - visual changes, fatigue CV: - chest pain, palpitations   Physical Exam: Vitals:   03/04/17 1330  BP: (!) 177/83  Pulse: 91  Temp: 98 F (36.7 C)  TempSrc: Oral  SpO2: 100%  Weight: 157 lb (71.2 kg)   General: Thin female in no acute distress Pulm: Good air movement with no wheezing or crackles  CV: RRR, normal S1 and S2, no murmurs, no rubs  Abdomen: Active bowel sounds, soft, non-distended, no tenderness to palpation  Extremities: Spontaneously moves all extremities, pulses palpable, no LE edema  Skin: Warm and dry   Assessment & Plan:   See Encounters Tab for problem based charting.  Patient seen with Dr. Rebeca Alert

## 2017-03-04 NOTE — Assessment & Plan Note (Addendum)
Patient presents today for continued evaluation and management of her HTN. She was previously seen on 5/7 and supposed to be taking Lisinopril 40 mg QD, Amlodipine 10 mg QD, and Chlorthalidone 25 mg QD. Unfortunately there appears to be a miscommunication and she has not been taking her medications. She reports that she has not taken her BP medications in "a long time." She has been having headaches frequently and states that her home blood pressure machine reads "in error." She went to [facility] and found her BP to be >200/90. She currently denies visual changes, SOB, chest pain, abdominal pain, changes in urinary habits.   We discussed the plan to restart medications today and the importance of lowering her BP. Discussed the risk of heart attacks and stroke with failure to do so. She will be started on Lisinopril 10 mg and Chlorthalidone 25 mg QD with follow-up in 1 week for BP recheck and a BMP.   Plan: - START Chlorthalidone 25 mg QD - START Lisinopril 10 mg QD - Return in 1 week for BP check and BMP  - If elevated at recheck in 1 week will increase her Lisinopril

## 2017-03-06 NOTE — Progress Notes (Signed)
Internal Medicine Clinic Attending  I saw and evaluated the patient.  I personally confirmed the key portions of the history and exam documented by Dr. Tarri Abernethy and I reviewed pertinent patient test results.  The assessment, diagnosis, and plan were formulated together and I agree with the documentation in the resident's note.  Oda Kilts, MD

## 2017-03-11 ENCOUNTER — Other Ambulatory Visit: Payer: Self-pay | Admitting: Internal Medicine

## 2017-03-11 ENCOUNTER — Ambulatory Visit (INDEPENDENT_AMBULATORY_CARE_PROVIDER_SITE_OTHER): Payer: Medicare Other | Admitting: Internal Medicine

## 2017-03-11 VITALS — BP 156/86 | HR 83 | Temp 97.8°F | Ht 59.0 in | Wt 154.0 lb

## 2017-03-11 DIAGNOSIS — E1121 Type 2 diabetes mellitus with diabetic nephropathy: Secondary | ICD-10-CM

## 2017-03-11 DIAGNOSIS — I152 Hypertension secondary to endocrine disorders: Secondary | ICD-10-CM

## 2017-03-11 DIAGNOSIS — E1159 Type 2 diabetes mellitus with other circulatory complications: Secondary | ICD-10-CM

## 2017-03-11 DIAGNOSIS — I10 Essential (primary) hypertension: Principal | ICD-10-CM

## 2017-03-11 DIAGNOSIS — Z79899 Other long term (current) drug therapy: Secondary | ICD-10-CM | POA: Diagnosis not present

## 2017-03-11 MED ORDER — LISINOPRIL 20 MG PO TABS: 20.0000 mg | ORAL_TABLET | Freq: Every day | ORAL | 1 refills | Status: DC

## 2017-03-11 NOTE — Progress Notes (Signed)
   CC: Follow up for hypertension management  HPI:  [name] is a 71 y.o. female with PMHx detailed below presenting for hypertension management.  See problem based assessment and plan below for additional details.  Hypertension associated with diabetes ([place]) HPI: She has started taking her lisinopril 10mg  and chlorthalidone 25mg  daily without noticing any adverse effects. She feels slightly better since her last visit but cannot specify in what way to me. She has not noticed any lightheadedness or changes in leg swelling. She does not check blood pressure at home. A: Hypertension above goal today. She has room to titrate current medications before needing additional agents. P: Increase lisinopril to 20mg  daily Continue chlorthalidone 25mg  daily BMP today Can RTC next month as BP is now stage 1 hypertension not urgent    Past Medical History:  Diagnosis Date  . Allergy   . Anemia   . Breast CA ([place])    (Rt) breast ca dx 4/12---  S/P RADICAL MASTECTOMY AND CHEMORADIATION  . Diabetes mellitus ORAL MED  . Fatty liver disease, nonalcoholic   . History of hyperkalemia   . Hx antineoplastic chemo  10/12 - 06/24/11   PACLITAX EL COMPLETED 06/24/11  . Hyperlipidemia   . Hypertension   . Mild nonproliferative diabetic retinopathy of right eye ([place]) 03/19/11   Dr. Joseph Art  . Numbness and tingling    Hx: of in fingers and toes since chemotherapy  . OA (osteoarthritis) of knee    RIGHT LEG  . Obesity   . Renal lesion     Review of Systems: ROS   Physical Exam: Vitals:   03/11/17 0920 03/11/17 0938  BP: (!) 160/81 (!) 156/86  Pulse: 80 83  Temp: 97.8 F (36.6 C)   TempSrc: Oral   SpO2: 100%   Weight: 154 lb (69.9 kg)   Height: 4\' 11"  (1.499 m)    GENERAL- alert, co-operative, NAD HEENT- Oral mucosa appears moist CARDIAC- RRR, no murmurs, rubs or gallops. RESP- CTAB, no wheezes or crackles. EXTREMITIES- Symmetric, no pedal edema. SKIN- Warm, dry, No  rash or lesion.   Assessment & Plan:   See encounters tab for problem based medical decision making.   Patient discussed with Dr. Angelia Mould

## 2017-03-11 NOTE — Patient Instructions (Signed)
It was a pleasure to see you today [name].  I am glad you are feeling well today. I recommend doubling your lisinopril dose to 20mg  (2 pills) daily, your new prescription will be for a stronger dose of medication.  We are checking blood work to make sure you are tolerating this medicine well. I will call if there is any problem.  We can see you back in several weeks to see how this is going.

## 2017-03-12 LAB — BMP8+ANION GAP
Anion Gap: 17 mmol/L (ref 10.0–18.0)
BUN/Creatinine Ratio: 19 (ref 12–28)
BUN: 18 mg/dL (ref 8–27)
CO2: 27 mmol/L (ref 20–29)
Calcium: 10.5 mg/dL — ABNORMAL HIGH (ref 8.7–10.3)
Chloride: 97 mmol/L (ref 96–106)
Creatinine, Ser: 0.93 mg/dL (ref 0.57–1.00)
GFR calc Af Amer: 73 mL/min/{1.73_m2} (ref >59)
GFR calc non Af Amer: 63 mL/min/{1.73_m2} (ref >59)
Glucose: 70 mg/dL (ref 65–99)
Potassium: 3.8 mmol/L (ref 3.5–5.2)
Sodium: 141 mmol/L (ref 134–144)

## 2017-03-13 NOTE — Assessment & Plan Note (Signed)
HPI: She has started taking her lisinopril 10mg  and chlorthalidone 25mg  daily without noticing any adverse effects. She feels slightly better since her last visit but cannot specify in what way to me. She has not noticed any lightheadedness or changes in leg swelling. She does not check blood pressure at home. A: Hypertension above goal today. She has room to titrate current medications before needing additional agents. P: Increase lisinopril to 20mg  daily Continue chlorthalidone 25mg  daily BMP today Can RTC next month as BP is now stage 1 hypertension not urgent

## 2017-03-16 NOTE — Progress Notes (Signed)
Internal Medicine Clinic Attending  Case discussed with Dr. Rice at the time of the visit.  We reviewed the resident's history and exam and pertinent patient test results.  I agree with the assessment, diagnosis, and plan of care documented in the resident's note.  

## 2017-03-28 ENCOUNTER — Encounter: Payer: Self-pay | Admitting: *Deleted

## 2017-03-31 ENCOUNTER — Encounter (HOSPITAL_COMMUNITY): Payer: Self-pay

## 2017-04-20 ENCOUNTER — Other Ambulatory Visit: Payer: Self-pay

## 2017-04-20 DIAGNOSIS — E1121 Type 2 diabetes mellitus with diabetic nephropathy: Secondary | ICD-10-CM

## 2017-04-20 MED ORDER — GLIPIZIDE 5 MG PO TABS: ORAL_TABLET | ORAL | 0 refills | Status: DC

## 2017-04-20 NOTE — Telephone Encounter (Signed)
glipiZIDE (GLUCOTROL) 5 MG tablet, Refill request @ [facility] on [address].  Per patient the pharmacy is still waiting for the clinic to reply back.

## 2017-05-07 ENCOUNTER — Encounter: Payer: Medicare Other | Admitting: Internal Medicine

## 2017-05-14 ENCOUNTER — Other Ambulatory Visit: Payer: Self-pay

## 2017-05-14 ENCOUNTER — Encounter: Payer: Self-pay | Admitting: Internal Medicine

## 2017-05-14 ENCOUNTER — Ambulatory Visit (INDEPENDENT_AMBULATORY_CARE_PROVIDER_SITE_OTHER): Payer: Medicare Other | Admitting: Internal Medicine

## 2017-05-14 VITALS — BP 123/74 | HR 99 | Temp 98.2°F | Ht 59.0 in | Wt 156.4 lb

## 2017-05-14 DIAGNOSIS — I152 Hypertension secondary to endocrine disorders: Secondary | ICD-10-CM | POA: Diagnosis not present

## 2017-05-14 DIAGNOSIS — N289 Disorder of kidney and ureter, unspecified: Secondary | ICD-10-CM

## 2017-05-14 DIAGNOSIS — E669 Obesity, unspecified: Secondary | ICD-10-CM

## 2017-05-14 DIAGNOSIS — Z9221 Personal history of antineoplastic chemotherapy: Secondary | ICD-10-CM | POA: Diagnosis not present

## 2017-05-14 DIAGNOSIS — Z9011 Acquired absence of right breast and nipple: Secondary | ICD-10-CM

## 2017-05-14 DIAGNOSIS — Z6831 Body mass index (BMI) 31.0-31.9, adult: Secondary | ICD-10-CM

## 2017-05-14 DIAGNOSIS — E1129 Type 2 diabetes mellitus with other diabetic kidney complication: Secondary | ICD-10-CM | POA: Diagnosis not present

## 2017-05-14 DIAGNOSIS — Z923 Personal history of irradiation: Secondary | ICD-10-CM | POA: Diagnosis not present

## 2017-05-14 DIAGNOSIS — Z7984 Long term (current) use of oral hypoglycemic drugs: Secondary | ICD-10-CM | POA: Diagnosis not present

## 2017-05-14 DIAGNOSIS — Z79899 Other long term (current) drug therapy: Secondary | ICD-10-CM | POA: Diagnosis not present

## 2017-05-14 DIAGNOSIS — Z79890 Hormone replacement therapy (postmenopausal): Secondary | ICD-10-CM

## 2017-05-14 DIAGNOSIS — E785 Hyperlipidemia, unspecified: Secondary | ICD-10-CM

## 2017-05-14 DIAGNOSIS — Z853 Personal history of malignant neoplasm of breast: Secondary | ICD-10-CM | POA: Diagnosis not present

## 2017-05-14 DIAGNOSIS — E1159 Type 2 diabetes mellitus with other circulatory complications: Secondary | ICD-10-CM

## 2017-05-14 DIAGNOSIS — E1121 Type 2 diabetes mellitus with diabetic nephropathy: Secondary | ICD-10-CM

## 2017-05-14 DIAGNOSIS — I10 Essential (primary) hypertension: Secondary | ICD-10-CM

## 2017-05-14 LAB — GLUCOSE, CAPILLARY: Glucose-Capillary: 134 mg/dL — ABNORMAL HIGH (ref 65–99)

## 2017-05-14 LAB — POCT GLYCOSYLATED HEMOGLOBIN (HGB A1C): Hemoglobin A1C: 6.7

## 2017-05-14 MED ORDER — LISINOPRIL 20 MG PO TABS: 20.0000 mg | ORAL_TABLET | Freq: Every day | ORAL | 1 refills | Status: DC

## 2017-05-14 MED ORDER — METFORMIN HCL 500 MG PO TABS: 500.0000 mg | ORAL_TABLET | Freq: Two times a day (BID) | ORAL | 1 refills | Status: DC

## 2017-05-14 MED ORDER — CHLORTHALIDONE 25 MG PO TABS: 25.0000 mg | ORAL_TABLET | Freq: Every day | ORAL | 5 refills | Status: DC

## 2017-05-14 MED ORDER — ATORVASTATIN CALCIUM 40 MG PO TABS: 40.0000 mg | ORAL_TABLET | Freq: Every day | ORAL | 3 refills | Status: DC

## 2017-05-14 MED ORDER — GLIPIZIDE 5 MG PO TABS: ORAL_TABLET | ORAL | 1 refills | Status: DC

## 2017-05-14 NOTE — Assessment & Plan Note (Signed)
HPI: Patient's A1c is 6.7 today.  She states that she is compliant with her glipizide 5 mg daily and metformin 500 mg twice daily.  She has unintentionally lost 8 pounds over the past 12 months.  She states that she eats a balanced diet and tries to control her portions.  She does not do any additional exercise daily.  She denies any pain in her extremities or symptoms of hypoglycemia.  A/P: Patient's A1c is below her goal.  We will continue with her glipizide 5 mg daily and metformin 500 mg twice daily.

## 2017-05-14 NOTE — Progress Notes (Signed)
   CC: Follow for hypertension and type 2 diabetes  HPI:  [name] is a 72 y.o. 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  For a detailed evaluation, assessment, and plan of her hypertension and diabetes please refer to problem based charting.  Past Medical History:  Diagnosis Date  . Allergy   . Anemia   . Breast CA ([place])    (Rt) breast ca dx 4/12---  S/P RADICAL MASTECTOMY AND CHEMORADIATION  . Diabetes mellitus ORAL MED  . Fatty liver disease, nonalcoholic   . History of hyperkalemia   . Hx antineoplastic chemo  10/12 - 06/24/11   PACLITAX EL COMPLETED 06/24/11  . Hyperlipidemia   . Hypertension   . Mild nonproliferative diabetic retinopathy of right eye ([place]) 03/19/11   Dr. Joseph Art  . Numbness and tingling    Hx: of in fingers and toes since chemotherapy  . OA (osteoarthritis) of knee    RIGHT LEG  . Obesity   . Renal lesion    Review of Systems:   12 point review of systems performed and negative aside from that mentioned in HPI.  Physical Exam: Vitals:   05/14/17 1536  BP: 123/74  Pulse: 99  Temp: 98.2 F (36.8 C)  TempSrc: Oral  SpO2: 99%  Weight: 156 lb 6.4 oz (70.9 kg)  Height: 4\' 11"  (1.499 m)   General: Obese female in no acute distress Pulm: Good air no wheezing or crackles CV: Regular rate and rhythm with no murmurs or rubs heard Axilla: No axillary adenopathy Abdomen: Active bowel sounds, soft, nondistended, no tenderness to palpation Extremities: Warm and dry, no lower extremity edema Neuro: Alert and oriented x3 Psych: Mood and affect normal   Assessment & Plan:   See Encounters Tab for problem based charting.  Patient discussed with Dr. Dareen Piano

## 2017-05-14 NOTE — Patient Instructions (Signed)
Thank you for allowing [place] to provide your care.  I have sent all your prescriptions to pick them up it is possible.  I will see him back in 6 months or sooner if issues.

## 2017-05-14 NOTE — Assessment & Plan Note (Signed)
HPI: Patient is currently taking lisinopril 20 mg and chlorthalidone 25 mg daily without any adverse effects.  She feels well overall.  She does not check her blood pressure at home.  She states that she eats a well-balanced diet, but does not exercise regularly.  She denies any lightheadedness, dizziness, headaches, visual changes, shortness of breath, chest pain, abdominal pain.  A/P: Patient's blood pressure is at goal (less than 130/80).  We will continue her lisinopril 20 mg and chlorthalidone 25 mg daily.

## 2017-05-14 NOTE — Assessment & Plan Note (Signed)
Patient requested a refill of her atorvastatin.  Prescription sent.

## 2017-05-15 ENCOUNTER — Telehealth: Payer: Self-pay | Admitting: *Deleted

## 2017-05-15 NOTE — Telephone Encounter (Signed)
Pt seen in [facility] on 05/14/2017 and left her AVS and lisinopril prescription.  Hippa complinat message left on pt's recorder.Jillian Hidden Cassady1/11/20194:49 PM

## 2017-05-15 NOTE — Progress Notes (Signed)
Internal Medicine Clinic Attending  Case discussed with Dr. Helberg at the time of the visit.  We reviewed the resident's history and exam and pertinent patient test results.  I agree with the assessment, diagnosis, and plan of care documented in the resident's note.    

## 2017-05-21 ENCOUNTER — Emergency Department (HOSPITAL_COMMUNITY): Payer: Medicare Other

## 2017-05-21 ENCOUNTER — Encounter (HOSPITAL_COMMUNITY): Payer: Self-pay | Admitting: Emergency Medicine

## 2017-05-21 ENCOUNTER — Other Ambulatory Visit: Payer: Self-pay

## 2017-05-21 ENCOUNTER — Emergency Department (HOSPITAL_COMMUNITY)
Admission: EM | Admit: 2017-05-21 | Discharge: 2017-05-21 | Disposition: A | Discharge status: Home / Self Care | Payer: Medicare Other | Attending: Emergency Medicine | Admitting: Emergency Medicine

## 2017-05-21 DIAGNOSIS — E119 Type 2 diabetes mellitus without complications: Secondary | ICD-10-CM | POA: Insufficient documentation

## 2017-05-21 DIAGNOSIS — D0591 Unspecified type of carcinoma in situ of right breast: Secondary | ICD-10-CM | POA: Insufficient documentation

## 2017-05-21 DIAGNOSIS — Z7984 Long term (current) use of oral hypoglycemic drugs: Secondary | ICD-10-CM | POA: Insufficient documentation

## 2017-05-21 DIAGNOSIS — I10 Essential (primary) hypertension: Secondary | ICD-10-CM | POA: Diagnosis not present

## 2017-05-21 DIAGNOSIS — J069 Acute upper respiratory infection, unspecified: Secondary | ICD-10-CM | POA: Insufficient documentation

## 2017-05-21 DIAGNOSIS — Z96651 Presence of right artificial knee joint: Secondary | ICD-10-CM | POA: Insufficient documentation

## 2017-05-21 DIAGNOSIS — J029 Acute pharyngitis, unspecified: Secondary | ICD-10-CM

## 2017-05-21 DIAGNOSIS — Z79899 Other long term (current) drug therapy: Secondary | ICD-10-CM | POA: Diagnosis not present

## 2017-05-21 DIAGNOSIS — B9789 Other viral agents as the cause of diseases classified elsewhere: Secondary | ICD-10-CM | POA: Diagnosis not present

## 2017-05-21 LAB — COMPREHENSIVE METABOLIC PANEL
ALT: 28 U/L (ref 14–54)
AST: 27 U/L (ref 15–41)
Albumin: 4.3 g/dL (ref 3.5–5.0)
Alkaline Phosphatase: 63 U/L (ref 38–126)
Anion gap: 14 (ref 5–15)
BUN: 14 mg/dL (ref 6–20)
CO2: 26 mmol/L (ref 22–32)
Calcium: 9.5 mg/dL (ref 8.9–10.3)
Chloride: 99 mmol/L — ABNORMAL LOW (ref 101–111)
Creatinine, Ser: 1.08 mg/dL — ABNORMAL HIGH (ref 0.44–1.00)
GFR calc Af Amer: 59 mL/min — ABNORMAL LOW (ref >60)
GFR calc non Af Amer: 51 mL/min — ABNORMAL LOW (ref >60)
Glucose, Bld: 117 mg/dL — ABNORMAL HIGH (ref 65–99)
Potassium: 3.2 mmol/L — ABNORMAL LOW (ref 3.5–5.1)
Sodium: 139 mmol/L (ref 135–145)
Total Bilirubin: 0.9 mg/dL (ref 0.3–1.2)
Total Protein: 7 g/dL (ref 6.5–8.1)

## 2017-05-21 LAB — CBC WITH DIFFERENTIAL/PLATELET
Basophils Absolute: 0 10*3/uL (ref 0.0–0.1)
Basophils Relative: 0 %
Eosinophils Absolute: 0.1 10*3/uL (ref 0.0–0.7)
Eosinophils Relative: 2 %
HCT: 35.3 % — ABNORMAL LOW (ref 36.0–46.0)
Hemoglobin: 11.6 g/dL — ABNORMAL LOW (ref 12.0–15.0)
Lymphocytes Relative: 27 %
Lymphs Abs: 1.3 10*3/uL (ref 0.7–4.0)
MCH: 29.7 pg (ref 26.0–34.0)
MCHC: 32.9 g/dL (ref 30.0–36.0)
MCV: 90.5 fL (ref 78.0–100.0)
Monocytes Absolute: 0.4 10*3/uL (ref 0.1–1.0)
Monocytes Relative: 8 %
Neutro Abs: 3 10*3/uL (ref 1.7–7.7)
Neutrophils Relative %: 63 %
Platelets: 187 10*3/uL (ref 150–400)
RBC: 3.9 MIL/uL (ref 3.87–5.11)
RDW: 17.2 % — ABNORMAL HIGH (ref 11.5–15.5)
WBC: 4.8 10*3/uL (ref 4.0–10.5)

## 2017-05-21 LAB — RAPID STREP SCREEN (MED CTR MEBANE ONLY): Streptococcus, Group A Screen (Direct): NEGATIVE

## 2017-05-21 MED ORDER — LIDOCAINE VISCOUS 2 % MT SOLN
15.0000 mL | Freq: Once | OROMUCOSAL | Status: AC
Start: 2017-05-21 — End: 2017-05-21
  Administered 2017-05-21: 15 mL via OROMUCOSAL
  Filled 2017-05-21: qty 15

## 2017-05-21 MED ORDER — ACETAMINOPHEN 325 MG PO TABS
650.0000 mg | ORAL_TABLET | Freq: Once | ORAL | Status: AC | PRN
  Administered 2017-05-21: 650 mg via ORAL
  Filled 2017-05-21: qty 2

## 2017-05-21 MED ORDER — ALBUTEROL SULFATE (2.5 MG/3ML) 0.083% IN NEBU
5.0000 mg | INHALATION_SOLUTION | Freq: Once | RESPIRATORY_TRACT | Status: AC
  Administered 2017-05-21: 5 mg via RESPIRATORY_TRACT
  Filled 2017-05-21: qty 6

## 2017-05-21 NOTE — ED Provider Notes (Signed)
[facility] EMERGENCY DEPARTMENT Provider Note   CSN: [id number] Arrival date & time: 05/21/17  0106     History   Chief Complaint Chief Complaint  Patient presents with  . Sore Throat    HPI [name] is a 72 y.o. female.  HPI   Day before yesterday began to have cough, sore throat began too, getting worse and worse to the point it was very painful to swallow, making it difficult.  Diffuse throat pain.  Sharp throat pain.  Tried hot tea which would help some.  No known fevers, no body aches.  Had flu shot.  Nonproductive cough.  Has had congestion as well, clear.   Past Medical History:  Diagnosis Date  . Allergy   . Anemia   . Breast CA ([place])    (Rt) breast ca dx 4/12---  S/P RADICAL MASTECTOMY AND CHEMORADIATION  . Diabetes mellitus ORAL MED  . Fatty liver disease, nonalcoholic   . History of hyperkalemia   . Hx antineoplastic chemo  10/12 - 06/24/11   PACLITAX EL COMPLETED 06/24/11  . Hyperlipidemia   . Hypertension   . Mild nonproliferative diabetic retinopathy of right eye ([place]) 03/19/11   Dr. Joseph Art  . Numbness and tingling    Hx: of in fingers and toes since chemotherapy  . OA (osteoarthritis) of knee    RIGHT LEG  . Obesity   . Renal lesion     Patient Active Problem List   Diagnosis Date Noted  . Pancreatitis 07/01/2016  . Nausea with vomiting 01/15/2016  . Constipation 01/15/2016  . Allergic urticaria 12/21/2015  . Poor appetite 11/27/2015  . Ganglion cyst 07/12/2015  . Piriformis syndrome of right side 03/02/2014  . Osteopenia 12/13/2013  . Vaginal dryness 12/13/2013  . Peripheral neuropathy 11/02/2013  . Bilateral hand numbness 09/02/2013  . Health care maintenance 11/17/2012  . Infection of prosthetic knee joint ([place]) 09/03/2012  . Arthritis of knee, right 08/16/2012  . Breast cancer, right breast ([place]) 09/25/2010  . COLONIC POLYPS, HX OF 07/19/2007  . OBESITY NOS 07/09/2006  . Type 2 diabetes mellitus with  renal manifestations ([place]) 03/10/2006  . Hyperlipidemia 03/10/2006  . Hypertension associated with diabetes ([place]) 03/10/2006  . ALLERGIC RHINITIS 03/10/2006  . Fatty liver 03/10/2006    Past Surgical History:  Procedure Laterality Date  . COLONOSCOPY W/ POLYPECTOMY     Hx; of  . I&D KNEE WITH POLY EXCHANGE Right 09/03/2012   Procedure: IRRIGATION AND DEBRIDEMENT RIGHT KNEE WITH POLY EXCHANGE;  Surgeon: Kerin Salen, MD;  Location: [facility];  Service: Orthopedics;  Laterality: Right;  . knee arthroscopy  10-28-1999   RIGHT  . Laparoscopy with laparoscopic right salpingo oophorectomy and lysis of pelvic and abdominal adhesions  March 2013   Dr. Nori Riis   . MASTECTOMY MODIFIED RADICAL  12-04-2010   W/ LEFT PAC PLACEMENT (RIGHT BREAST W/ AXILLARY CONTENTS/ NODE BX'S  . PORT-A-CATH REMOVAL  09/24/2011   Procedure: REMOVAL PORT-A-CATH;  Surgeon: Joyice Faster. Cornett, MD;  Location: [facility];  Service: General;  Laterality: N/A;  . PORTACATH PLACEMENT     REPLACED PAC DUE TO MALFUNCTION (LEFT)  . TOTAL KNEE ARTHROPLASTY Right 08/16/2012   Procedure: TOTAL KNEE ARTHROPLASTY;  Surgeon: Kerin Salen, MD;  Location: [facility];  Service: Orthopedics;  Laterality: Right;  . TRANSTHORACIC ECHOCARDIOGRAM  10-29-2010   NORMAL LVF/ EF 55-60%/ MILD MV REGURG  . TUBAL LIGATION  YRS AGO  . VAGINAL HYSTERECTOMY  AGE 55  W/ LSO    OB History    Gravida Para Term Preterm AB Living   4 4 4     4    SAB TAB Ectopic Multiple Live Births                   Home Medications    Prior to Admission medications   Medication Sig Start Date End Date Taking? Authorizing Provider  atorvastatin (LIPITOR) 40 MG tablet Take 1 tablet (40 mg total) by mouth at bedtime. 05/14/17  Yes Helberg, Larkin Ina, MD  chlorthalidone (HYGROTON) 25 MG tablet Take 1 tablet (25 mg total) by mouth daily. 05/14/17  Yes Helberg, Larkin Ina, MD  glipiZIDE (GLUCOTROL) 5 MG tablet TAKE 1 TABLET(5 MG) BY MOUTH TWICE DAILY BEFORE A MEAL 05/14/17  Yes Helberg,  Larkin Ina, MD  lisinopril (PRINIVIL,ZESTRIL) 20 MG tablet Take 1 tablet (20 mg total) by mouth daily. 05/14/17  Yes Helberg, Larkin Ina, MD  metFORMIN (GLUCOPHAGE) 500 MG tablet Take 1 tablet (500 mg total) by mouth 2 (two) times daily with a meal. 05/14/17  Yes Helberg, Larkin Ina, MD  Polyvinyl Alcohol-Povidone (REFRESH OP) Place 2 drops into both eyes daily as needed (for dry eyes).    Yes [provider]    Family History Family History  Problem Relation Age of Onset  . Stroke Father   . Heart disease Sister   . Cancer Sister        breast  . Heart disease Brother   . Cancer Sister        COLON CANCER    Social History Social History   Tobacco Use  . Smoking status: Never Smoker  . Smokeless tobacco: Never Used  Substance Use Topics  . Alcohol use: No    Alcohol/week: 0.0 oz  . Drug use: No     Allergies   Hctz [hydrochlorothiazide] and Metformin   Review of Systems Review of Systems  Constitutional: Negative for fever.  HENT: Positive for congestion and sore throat.   Eyes: Negative for visual disturbance.  Respiratory: Positive for cough. Negative for shortness of breath.   Cardiovascular: Negative for chest pain.  Gastrointestinal: Negative for abdominal pain, nausea and vomiting.  Genitourinary: Negative for difficulty urinating.  Musculoskeletal: Negative for back pain and neck pain.  Skin: Negative for rash.  Neurological: Negative for syncope and headaches.     Physical Exam Updated Vital Signs BP 123/75 (BP Location: Right Arm)   Pulse 87   Temp 98.7 F (37.1 C) (Oral)   Resp 16   SpO2 99%   Physical Exam  Constitutional: [name], place, and time. She appears well-developed and well-nourished. No distress.  HENT:  Head: Normocephalic and atraumatic.  Mouth/Throat: Uvula is midline. No uvula swelling. No oropharyngeal exudate, posterior oropharyngeal edema, posterior oropharyngeal erythema or tonsillar abscesses.  Eyes:  Conjunctivae and EOM are normal.  Neck: Normal range of motion.  Cardiovascular: Normal rate, regular rhythm, normal heart sounds and intact distal pulses. Exam reveals no gallop and no friction rub.  No murmur heard. Pulmonary/Chest: Effort normal and breath sounds normal. No respiratory distress. She has no wheezes. She has no rales.  Abdominal: Soft. She exhibits no distension. There is no tenderness. There is no guarding.  Musculoskeletal: She exhibits no edema or tenderness.  Neurological: She is alert and oriented to person, place, and time.  Skin: Skin is warm and dry. No rash noted. She is not diaphoretic. No erythema.  Nursing note and vitals reviewed.  ED Treatments / Results  Labs (all labs ordered are listed, but only abnormal results are displayed) Labs Reviewed  CBC WITH DIFFERENTIAL/PLATELET - Abnormal; Notable for the following components:      Result Value   Hemoglobin 11.6 (*)    HCT 35.3 (*)    RDW 17.2 (*)    All other components within normal limits  COMPREHENSIVE METABOLIC PANEL - Abnormal; Notable for the following components:   Potassium 3.2 (*)    Chloride 99 (*)    Glucose, Bld 117 (*)    Creatinine, Ser 1.08 (*)    GFR calc non Af Amer 51 (*)    GFR calc Af Amer 59 (*)    All other components within normal limits  RAPID STREP SCREEN (NOT AT [facility])  CULTURE, GROUP A STREP [facility])    EKG  EKG Interpretation  Date/Time:  Thursday May 21 2017 01:28:16 EST Ventricular Rate:  103 PR Interval:  166 QRS Duration: 82 QT Interval:  362 QTC Calculation: 474 R Axis:   5 Text Interpretation:  Sinus tachycardia Possible Anterior infarct , age undetermined Abnormal ECG Since prior ECG, nonspecific ST changes Confirmed by Gareth Morgan [phone]) on 05/21/2017 7:31:30 AM       Radiology Dg Chest 2 View  Result Date: 05/21/2017 CLINICAL DATA:  71 y/o F; sore throat, laryngitis, upper respiratory infection. EXAM: CHEST  2 VIEW COMPARISON:  None.  FINDINGS: Mild cardiomegaly. No consolidation, effusion, or pneumothorax. Surgical clips project over the right axilla. Flowing anterior ossification of the thoracic spine compatible with DISH. No acute osseous abnormality. IMPRESSION: Mild cardiomegaly.  No acute pulmonary process identified. Electronically Signed   By: Kristine Garbe M.D.   On: 05/21/2017 01:28    Procedures Procedures (including critical care time)  Medications Ordered in ED Medications  acetaminophen (TYLENOL) tablet 650 mg (650 mg Oral Given 05/21/17 0127)  albuterol (PROVENTIL) (2.5 MG/3ML) 0.083% nebulizer solution 5 mg (5 mg Nebulization Given 05/21/17 0128)  lidocaine (XYLOCAINE) 2 % viscous mouth solution 15 mL (15 mLs Mouth/Throat Given 05/21/17 0806)     Initial Impression / Assessment and Plan / ED Course  I have reviewed the triage vital signs and the nursing notes.  Pertinent labs & imaging results that were available during my care of the patient were reviewed by me and considered in my medical decision making (see chart for details).     73 year old female with history above presents with concern for cough and sore throat.  Chest x-ray shows no sign of pneumonia.  Show no significant abnormalities.  Discussed that patient's creatinine is mildly elevated from prior, recommend primary care physician follow-up.  Strep screen is negative.  No signs of retropharyngeal abscess, epiglottitis, peritonsillar abscess on exam.  Suspect symptoms of cough and sore throat are secondary to viral upper respiratory infection and pharyngitis.  She is given lidocaine in the emergency department.  Do not feel that benefit of steroids outweighs risks in this patient with a history of diabetes.  Recommend continued supportive care. Patient discharged in stable condition with understanding of reasons to return.   Final Clinical Impressions(s) / ED Diagnoses   Final diagnoses:  Viral pharyngitis  Upper respiratory tract  infection, unspecified type    ED Discharge Orders    None       Gareth Morgan, MD 05/21/17 1811

## 2017-05-21 NOTE — ED Triage Notes (Signed)
Patient here with sore throat, laryngitis and URI symptoms.  She states that this all started yesterday.

## 2017-05-23 LAB — CULTURE, GROUP A STREP (THRC)

## 2017-05-24 ENCOUNTER — Other Ambulatory Visit: Payer: Self-pay | Admitting: Internal Medicine

## 2017-05-24 DIAGNOSIS — E1159 Type 2 diabetes mellitus with other circulatory complications: Secondary | ICD-10-CM

## 2017-05-24 DIAGNOSIS — E1121 Type 2 diabetes mellitus with diabetic nephropathy: Secondary | ICD-10-CM

## 2017-05-24 DIAGNOSIS — I10 Essential (primary) hypertension: Secondary | ICD-10-CM

## 2017-05-24 DIAGNOSIS — I152 Hypertension secondary to endocrine disorders: Secondary | ICD-10-CM

## 2017-05-27 ENCOUNTER — Telehealth: Payer: Self-pay | Admitting: Internal Medicine

## 2017-06-27 ENCOUNTER — Other Ambulatory Visit: Payer: Self-pay | Admitting: Internal Medicine

## 2017-06-27 DIAGNOSIS — I10 Essential (primary) hypertension: Secondary | ICD-10-CM

## 2017-06-27 DIAGNOSIS — I152 Hypertension secondary to endocrine disorders: Secondary | ICD-10-CM

## 2017-06-27 DIAGNOSIS — E1159 Type 2 diabetes mellitus with other circulatory complications: Secondary | ICD-10-CM

## 2017-06-27 DIAGNOSIS — E1121 Type 2 diabetes mellitus with diabetic nephropathy: Secondary | ICD-10-CM

## 2017-07-01 ENCOUNTER — Ambulatory Visit (INDEPENDENT_AMBULATORY_CARE_PROVIDER_SITE_OTHER): Payer: Medicare Other | Admitting: Internal Medicine

## 2017-07-01 ENCOUNTER — Encounter (INDEPENDENT_AMBULATORY_CARE_PROVIDER_SITE_OTHER): Payer: Self-pay

## 2017-07-01 ENCOUNTER — Other Ambulatory Visit: Payer: Self-pay

## 2017-07-01 VITALS — BP 114/59 | HR 94 | Temp 98.1°F | Ht 59.0 in | Wt 154.4 lb

## 2017-07-01 DIAGNOSIS — R1031 Right lower quadrant pain: Secondary | ICD-10-CM | POA: Diagnosis not present

## 2017-07-01 DIAGNOSIS — Z853 Personal history of malignant neoplasm of breast: Secondary | ICD-10-CM

## 2017-07-01 DIAGNOSIS — E785 Hyperlipidemia, unspecified: Secondary | ICD-10-CM | POA: Diagnosis not present

## 2017-07-01 DIAGNOSIS — Z9221 Personal history of antineoplastic chemotherapy: Secondary | ICD-10-CM

## 2017-07-01 DIAGNOSIS — Z90710 Acquired absence of both cervix and uterus: Secondary | ICD-10-CM

## 2017-07-01 DIAGNOSIS — E119 Type 2 diabetes mellitus without complications: Secondary | ICD-10-CM | POA: Diagnosis not present

## 2017-07-01 DIAGNOSIS — I10 Essential (primary) hypertension: Secondary | ICD-10-CM | POA: Diagnosis not present

## 2017-07-01 DIAGNOSIS — Z8719 Personal history of other diseases of the digestive system: Secondary | ICD-10-CM | POA: Diagnosis not present

## 2017-07-01 DIAGNOSIS — R1033 Periumbilical pain: Secondary | ICD-10-CM

## 2017-07-01 DIAGNOSIS — Z90722 Acquired absence of ovaries, bilateral: Secondary | ICD-10-CM

## 2017-07-01 DIAGNOSIS — R1032 Left lower quadrant pain: Secondary | ICD-10-CM

## 2017-07-01 NOTE — Progress Notes (Addendum)
CC: abdominal pain   HPI:  [name] is a 72 y.o. with PMH hypertension, hyperlipidemia, type 2 diabetes, breast cancer who presents for abdominal pain . Please see the assessment and plans for the status of the patient chronic medical problems.   Past Medical History:  Diagnosis Date  . Allergy   . Anemia   . Breast CA ([place])    (Rt) breast ca dx 4/12---  S/P RADICAL MASTECTOMY AND CHEMORADIATION  . Diabetes mellitus ORAL MED  . Fatty liver disease, nonalcoholic   . History of hyperkalemia   . Hx antineoplastic chemo  10/12 - 06/24/11   PACLITAX EL COMPLETED 06/24/11  . Hyperlipidemia   . Hypertension   . Mild nonproliferative diabetic retinopathy of right eye ([place]) 03/19/11   Dr. Joseph Art  . Numbness and tingling    Hx: of in fingers and toes since chemotherapy  . OA (osteoarthritis) of knee    RIGHT LEG  . Obesity   . Renal lesion    Review of Systems:  Refer to history of present illness and assessment and plans for pertinent review of systems, all others reviewed and negative  Physical Exam:  Vitals:   07/01/17 1049  BP: (!) 114/59  Pulse: 94  Temp: 98.1 F (36.7 C)  TempSrc: Oral  SpO2: 100%  Weight: 154 lb 6.4 oz (70 kg)  Height: 4\' 11"  (1.499 m)   General: well appearing, no acute distress  Abdomen: BS delayed, soft, non distended, mildly tender to palpation at the periumbilicus and left lower quadrant. She has sudden onset of sharp right lower abdomen pain when moving from lying down to sitting up on the exam table  Skin: no rashes visible on the exposed abdomen, arms, face, or neck   Assessment & Plan:   Abdominal pain  Three weeks of sharp periumbilical abdominal pain associated with right lower quadrant pain and a point tenderness in the left lower abdomen. The pain comes and goes, at times it resolves completely and at times it is nagging and annoying for her. Only history of abdominal surgery is a hysterectomy greater than 10 years ago.  She has no history of abdominal pain in the past. She does have a history of alternating constipation and diarrhea. She takes glucerna and this helps to limit the constipation. She has not noticed if the bowel movements improve her pain. Denies dysuria. Denies nausea, vomiting, or decreased appetite. Denies rashes of the overlying skin. She says the pain does not improve with readjusting her position. She has a history of breast cancer and has had a hysterectomy and salpingoophrectomy. She has lost 10 pounds in the past year, she began having a reduced appetite when she began chemotherapy. She is now off of chemotherapy.  Last BMP showed signs of dehydration and hypokalemia. The sudden worsening of her pain with movement on the table is consistent with muscle strain.  - heat, rest, and tylenol  - encouraged increased hydration  - Recheck BMP today  Addendum: BMET shows persistence of kidney injury, now with this extended duration of kidney injury she is in acute kidney disease. I have tried both of the phone numbers in Ms. Cates chart but both go straight to voicemail. I will ask the triage to try and get in touch with Ms. Abdou to advise increasing the amount of water that she is drinking to 8 ounces daily, no NSAIDS, and return in one week for follow up BMET and possibly urinalysis.   See  Encounters Tab for problem based charting.  Patient discussed with Dr. Dareen Piano

## 2017-07-01 NOTE — Assessment & Plan Note (Addendum)
Three weeks of sharp periumbilical abdominal pain associated with right lower quadrant pain and a point tenderness in the left lower abdomen. The pain comes and goes, at times it resolves completely and at times it is nagging and annoying for her. Only history of abdominal surgery is a hysterectomy greater than 10 years ago. She has no history of abdominal pain. She does have a history of alternating constipation and diarrhea. She takes glucerna and this helps to limit the constipation. She has not noticed if the bowel movements improve her pain. Denies dysuria. Denies nausea, vomiting, or decreased appetite. Denies rashes of the overlying skin. The pain does not change with movement. She has a history of breast cancer and has had a hysterectomy and salpingoophrectomy. She has lost 10 pounds in the past year, she began having a reduced appetite when she began chemotherapy. She is now off of chemotherapy.  Last BMP showed signs of dehydration and hypokalemia. The sudden worsening of her pain with movement on the table is consistent with muscle strain.  - heat, rest, and tylenol  - encouraged increased hydration  - Recheck BMP today  Addendum: BMET shows persistence of kidney injury, now with this extended duration of kidney injury she is in acute kidney disease. I have tried both of the phone numbers in Ms. Tackitt chart but both go straight to voicemail. I will ask the triage to try and get in touch with Ms. Posa to advise increasing the amount of water that she is drinking to 8 ounces daily, no NSAIDS, and return in one week for follow up BMET and possibly urinalysis.

## 2017-07-01 NOTE — Patient Instructions (Addendum)
Thank you for coming to the clinic today. It was a pleasure to see you.   Your abdominal pain may be from you straining a muscle- try a taking tylenol and applying heat or ice when the pain gets bad. Call the clinic and let us know if the pain is getting worse or becomes associated with any other new symptoms   FOLLOW-UP INSTRUCTIONS When: 1 months (march 18th) with Dr. Tarri Abernethy for abdominal pain,  If the abdominal pain has resolved you can cancel that appointment and follow up in 6 months with Dr. Tarri Abernethy  For: blood pressure and diabetes What to bring: all of your medication bottles   Please call our clinic if you have any questions or concerns, we may be able to help and keep you from a long and expensive emergency room wait. Our clinic and after hours phone number is [phone], there is always someone available.

## 2017-07-02 LAB — BMP8+ANION GAP
Anion Gap: 18 mmol/L (ref 10.0–18.0)
BUN/Creatinine Ratio: 23 (ref 12–28)
BUN: 24 mg/dL (ref 8–27)
CO2: 27 mmol/L (ref 20–29)
Calcium: 10 mg/dL (ref 8.7–10.3)
Chloride: 96 mmol/L (ref 96–106)
Creatinine, Ser: 1.05 mg/dL — ABNORMAL HIGH (ref 0.57–1.00)
GFR calc Af Amer: 63 mL/min/{1.73_m2} (ref >59)
GFR calc non Af Amer: 54 mL/min/{1.73_m2} — ABNORMAL LOW (ref >59)
Glucose: 211 mg/dL — ABNORMAL HIGH (ref 65–99)
Potassium: 3.6 mmol/L (ref 3.5–5.2)
Sodium: 141 mmol/L (ref 134–144)

## 2017-07-02 NOTE — Progress Notes (Signed)
Internal Medicine Clinic Attending  Case discussed with Dr. Blum at the time of the visit.  We reviewed the resident's history and exam and pertinent patient test results.  I agree with the assessment, diagnosis, and plan of care documented in the resident's note. 

## 2017-07-03 ENCOUNTER — Telehealth: Payer: Self-pay | Admitting: *Deleted

## 2017-07-03 NOTE — Telephone Encounter (Signed)
Received return call from patient-message below was relayed to patient.  She will f/u in [facility] in one week 03/8 for follow-up.[name], Jillian Krizan Cassady3/1/20192:00 PM

## 2017-07-03 NOTE — Telephone Encounter (Signed)
-----   Message from Ledell Noss, MD sent at 07/03/2017 12:26 PM EST ----- BMET shows persistence of kidney injury, now with this extended duration of kidney injury she is in acute kidney disease. I have tried both of the phone numbers in [name] chart but both go straight to voicemail. I will ask the triage to try and get in touch with [name] to advise increasing the amount of water that she is drinking to 8 ounces daily, no NSAIDS, and return in one week for follow up BMET and possibly urinalysis.

## 2017-07-10 ENCOUNTER — Encounter: Payer: Self-pay | Admitting: Internal Medicine

## 2017-07-10 ENCOUNTER — Ambulatory Visit (INDEPENDENT_AMBULATORY_CARE_PROVIDER_SITE_OTHER): Payer: Medicare Other | Admitting: Internal Medicine

## 2017-07-10 VITALS — BP 131/65 | HR 83 | Temp 98.4°F | Ht 59.0 in | Wt 154.7 lb

## 2017-07-10 DIAGNOSIS — E1159 Type 2 diabetes mellitus with other circulatory complications: Secondary | ICD-10-CM | POA: Diagnosis not present

## 2017-07-10 DIAGNOSIS — I152 Hypertension secondary to endocrine disorders: Secondary | ICD-10-CM

## 2017-07-10 DIAGNOSIS — I10 Essential (primary) hypertension: Secondary | ICD-10-CM

## 2017-07-10 DIAGNOSIS — Z79899 Other long term (current) drug therapy: Secondary | ICD-10-CM

## 2017-07-10 DIAGNOSIS — R1013 Epigastric pain: Secondary | ICD-10-CM

## 2017-07-10 DIAGNOSIS — E785 Hyperlipidemia, unspecified: Secondary | ICD-10-CM

## 2017-07-10 LAB — BASIC METABOLIC PANEL
Anion gap: 11 (ref 5–15)
BUN: 18 mg/dL (ref 6–20)
CO2: 28 mmol/L (ref 22–32)
Calcium: 9.7 mg/dL (ref 8.9–10.3)
Chloride: 102 mmol/L (ref 101–111)
Creatinine, Ser: 1.03 mg/dL — ABNORMAL HIGH (ref 0.44–1.00)
GFR calc Af Amer: >60 mL/min (ref >60)
GFR calc non Af Amer: 54 mL/min — ABNORMAL LOW (ref >60)
Glucose, Bld: 85 mg/dL (ref 65–99)
Potassium: 3.8 mmol/L (ref 3.5–5.1)
Sodium: 141 mmol/L (ref 135–145)

## 2017-07-10 NOTE — Progress Notes (Signed)
   CC: Abdominal pain  HPI:  [name] is a 72 y.o. with HTN, HLD, and DM who presented to the clinic for continued evaluation and management of her abdominal pain. For a detailed assessment and management plan please refer to problem based charting below.   Past Medical History:  Diagnosis Date  . Allergy   . Anemia   . Breast CA ([place])    (Rt) breast ca dx 4/12---  S/P RADICAL MASTECTOMY AND CHEMORADIATION  . Diabetes mellitus ORAL MED  . Fatty liver disease, nonalcoholic   . History of hyperkalemia   . Hx antineoplastic chemo  10/12 - 06/24/11   PACLITAX EL COMPLETED 06/24/11  . Hyperlipidemia   . Hypertension   . Mild nonproliferative diabetic retinopathy of right eye ([place]) 03/19/11   Dr. Joseph Art  . Numbness and tingling    Hx: of in fingers and toes since chemotherapy  . OA (osteoarthritis) of knee    RIGHT LEG  . Obesity   . Renal lesion    Review of Systems:   Denies N/V, abdominal pain  Denies fever, chills, headaches  Physical Exam: Vitals:   07/10/17 0929  BP: 131/65  Pulse: 83  Temp: 98.4 F (36.9 C)  TempSrc: Oral  SpO2: 100%  Weight: 154 lb 11.2 oz (70.2 kg)  Height: 4\' 11"  (1.499 m)   General: Well  Nourished female in no acute distress Pulm: Good air movement with no wheezing or crackles  CV: RRR, no murmurs, no rubs  Abdomen: Active bowel sounds, soft, non-distended, no tenderness to palpation   Assessment & Plan:   See Encounters Tab for problem based charting.  Patient discussed with Dr. Evette Doffing

## 2017-07-10 NOTE — Progress Notes (Signed)
Internal Medicine Clinic Attending  Case discussed with Dr. Helberg at the time of the visit.  We reviewed the resident's history and exam and pertinent patient test results.  I agree with the assessment, diagnosis, and plan of care documented in the resident's note.    

## 2017-07-10 NOTE — Patient Instructions (Addendum)
Thank you for allowing [place] to provide your care. I will call you with the results of your lab work.   Good luck on your studies this weekend and your upcoming test!  I will see you in 3 months.

## 2017-07-10 NOTE — Assessment & Plan Note (Addendum)
Patient presents for continued evaluation of her controlled hypertension. She is on lisinopril 20 mg daily and chlorathalidone 25 mg daily. She was recently evaluated for abdominal pain and at that visit a BNP was obtained. Her creatinine had increased and she returned today for recheck. Today her blood pressure is goal, and we will not make any adjustments to her medications. Repeat BMP illustrator creatinine of 1.03. I do not feel that any adjustments to her lisinopril or chlorthalidone need to be pursued at this point.

## 2017-07-10 NOTE — Assessment & Plan Note (Addendum)
Patient's epigastric abdominal pain has since resolved. She states that she feels her abdominal pain is related to drinking carbonated beverages and since cutting out carbonated beverages her epigastric abdominal pain is completely resolved. No further management at this point.

## 2017-07-20 ENCOUNTER — Encounter: Payer: Medicare Other | Admitting: Internal Medicine

## 2017-09-11 NOTE — Telephone Encounter (Signed)
Appt made

## 2017-09-29 ENCOUNTER — Other Ambulatory Visit: Payer: Self-pay | Admitting: Internal Medicine

## 2017-09-29 DIAGNOSIS — E1159 Type 2 diabetes mellitus with other circulatory complications: Secondary | ICD-10-CM

## 2017-09-29 DIAGNOSIS — I152 Hypertension secondary to endocrine disorders: Secondary | ICD-10-CM

## 2017-09-29 DIAGNOSIS — I10 Essential (primary) hypertension: Secondary | ICD-10-CM

## 2017-09-29 DIAGNOSIS — E1121 Type 2 diabetes mellitus with diabetic nephropathy: Secondary | ICD-10-CM

## 2017-11-06 ENCOUNTER — Other Ambulatory Visit: Payer: Self-pay | Admitting: Student in an Organized Health Care Education/Training Program

## 2017-11-23 ENCOUNTER — Other Ambulatory Visit: Payer: Self-pay | Admitting: Adult Health

## 2017-11-23 ENCOUNTER — Other Ambulatory Visit: Payer: Self-pay | Admitting: Oncology

## 2017-11-23 DIAGNOSIS — Z1231 Encounter for screening mammogram for malignant neoplasm of breast: Secondary | ICD-10-CM

## 2017-12-08 ENCOUNTER — Other Ambulatory Visit: Payer: Self-pay | Admitting: Internal Medicine

## 2017-12-08 ENCOUNTER — Telehealth: Payer: Self-pay | Admitting: Internal Medicine

## 2017-12-08 DIAGNOSIS — I10 Essential (primary) hypertension: Principal | ICD-10-CM

## 2017-12-08 DIAGNOSIS — I152 Hypertension secondary to endocrine disorders: Secondary | ICD-10-CM

## 2017-12-08 DIAGNOSIS — E1159 Type 2 diabetes mellitus with other circulatory complications: Secondary | ICD-10-CM

## 2017-12-10 NOTE — Telephone Encounter (Signed)
Dr. Jerrell Mylar first available appointment is not until September 12.  Patient has been scheduled to see him that day at 3:45 pm.  Forwarding message back to triage.

## 2017-12-14 LAB — HM DIABETES EYE EXAM

## 2017-12-22 ENCOUNTER — Other Ambulatory Visit: Payer: Self-pay | Admitting: Internal Medicine

## 2017-12-22 DIAGNOSIS — E1121 Type 2 diabetes mellitus with diabetic nephropathy: Secondary | ICD-10-CM

## 2017-12-22 NOTE — Telephone Encounter (Signed)
Next appt scheduled 9/12 with PCP. 

## 2017-12-23 ENCOUNTER — Other Ambulatory Visit: Payer: Self-pay | Admitting: Internal Medicine

## 2017-12-23 ENCOUNTER — Other Ambulatory Visit: Payer: Self-pay | Admitting: Student in an Organized Health Care Education/Training Program

## 2017-12-23 DIAGNOSIS — E1121 Type 2 diabetes mellitus with diabetic nephropathy: Secondary | ICD-10-CM

## 2017-12-23 DIAGNOSIS — E785 Hyperlipidemia, unspecified: Secondary | ICD-10-CM

## 2018-01-01 ENCOUNTER — Ambulatory Visit
Admission: RE | Admit: 2018-01-01 | Discharge: 2018-01-01 | Disposition: A | Discharge status: Home / Self Care | Payer: Medicare Other | Source: Ambulatory Visit | Attending: Oncology | Admitting: Oncology

## 2018-01-01 DIAGNOSIS — Z1231 Encounter for screening mammogram for malignant neoplasm of breast: Secondary | ICD-10-CM

## 2018-01-03 ENCOUNTER — Other Ambulatory Visit: Payer: Self-pay | Admitting: Internal Medicine

## 2018-01-03 DIAGNOSIS — I152 Hypertension secondary to endocrine disorders: Secondary | ICD-10-CM

## 2018-01-03 DIAGNOSIS — E1159 Type 2 diabetes mellitus with other circulatory complications: Secondary | ICD-10-CM

## 2018-01-03 DIAGNOSIS — I10 Essential (primary) hypertension: Principal | ICD-10-CM

## 2018-01-14 ENCOUNTER — Ambulatory Visit: Payer: Medicare Other | Admitting: Pharmacist

## 2018-01-14 ENCOUNTER — Ambulatory Visit (INDEPENDENT_AMBULATORY_CARE_PROVIDER_SITE_OTHER): Payer: Medicare Other | Admitting: Internal Medicine

## 2018-01-14 ENCOUNTER — Other Ambulatory Visit: Payer: Self-pay

## 2018-01-14 ENCOUNTER — Ambulatory Visit (INDEPENDENT_AMBULATORY_CARE_PROVIDER_SITE_OTHER): Payer: Medicare Other | Admitting: Dietician

## 2018-01-14 ENCOUNTER — Encounter: Payer: Self-pay | Admitting: Dietician

## 2018-01-14 ENCOUNTER — Encounter: Payer: Self-pay | Admitting: Internal Medicine

## 2018-01-14 ENCOUNTER — Encounter (INDEPENDENT_AMBULATORY_CARE_PROVIDER_SITE_OTHER): Payer: Self-pay

## 2018-01-14 VITALS — BP 116/63 | HR 97 | Temp 98.8°F | Ht 59.0 in | Wt 158.8 lb

## 2018-01-14 DIAGNOSIS — E1121 Type 2 diabetes mellitus with diabetic nephropathy: Secondary | ICD-10-CM | POA: Diagnosis not present

## 2018-01-14 DIAGNOSIS — E1159 Type 2 diabetes mellitus with other circulatory complications: Secondary | ICD-10-CM

## 2018-01-14 DIAGNOSIS — E1129 Type 2 diabetes mellitus with other diabetic kidney complication: Secondary | ICD-10-CM | POA: Diagnosis not present

## 2018-01-14 DIAGNOSIS — I10 Essential (primary) hypertension: Secondary | ICD-10-CM

## 2018-01-14 DIAGNOSIS — Z23 Encounter for immunization: Secondary | ICD-10-CM | POA: Diagnosis not present

## 2018-01-14 DIAGNOSIS — N289 Disorder of kidney and ureter, unspecified: Secondary | ICD-10-CM | POA: Diagnosis not present

## 2018-01-14 DIAGNOSIS — I152 Hypertension secondary to endocrine disorders: Secondary | ICD-10-CM

## 2018-01-14 DIAGNOSIS — Z7984 Long term (current) use of oral hypoglycemic drugs: Secondary | ICD-10-CM

## 2018-01-14 LAB — GLUCOSE, CAPILLARY: Glucose-Capillary: 173 mg/dL — ABNORMAL HIGH (ref 70–99)

## 2018-01-14 LAB — POCT GLYCOSYLATED HEMOGLOBIN (HGB A1C): Hemoglobin A1C: 6.9 % — AB (ref 4.0–5.6)

## 2018-01-14 NOTE — Progress Notes (Signed)
Medicine attending: Medical history, presenting problems, physical findings, and medications, reviewed with resident physician Dr Justin Helberg on the day of the patient visit and I concur with his evaluation and management plan. 

## 2018-01-14 NOTE — Progress Notes (Addendum)
Freestyle Libre Pro CGM sensor placed and started. The first sensor malfunctioned. A replacement was placed and call to [employer] for a replacment. The new sensor  Information is:  Lot #  [id number] Serial # [id number] Expiration date: 08/03/2018  Patient was educated about wearing sensor, keeping food, activity and medication log and when to call office. She was also educated to not have an MRI, Diathermy or CT scan while wearing the sensor. [name] verbalized understanding to the education. She does water aerobics three times a week. She is interested in seeing what her blood sugars are. She was provided with an extra tegaderm cover in case hers becomes loose.   Follow up was arranged with the patient.  [name], RD 01/14/2018 4:37 PM.

## 2018-01-14 NOTE — Progress Notes (Signed)
   CC: Diabetes Mellitus   HPI:  [name] is a 72 y.o. female who presented to the clinic for continued evaluation and management of her chronic medical illnesses. For a detailed assessment and plan please refer to problem based charting below.   Past Medical History:  Diagnosis Date  . Allergy   . Anemia   . Breast CA ([place])    (Rt) breast ca dx 4/12---  S/P RADICAL MASTECTOMY AND CHEMORADIATION  . Diabetes mellitus ORAL MED  . Fatty liver disease, nonalcoholic   . History of hyperkalemia   . Hx antineoplastic chemo  10/12 - 06/24/11   PACLITAX EL COMPLETED 06/24/11  . Hyperlipidemia   . Hypertension   . Mild nonproliferative diabetic retinopathy of right eye ([place]) 03/19/11   Dr. Joseph Art  . Numbness and tingling    Hx: of in fingers and toes since chemotherapy  . OA (osteoarthritis) of knee    RIGHT LEG  . Obesity   . Renal lesion    Review of Systems:  12 point ROS preformed. All negative aside from those mentioned in the HPI.  Physical Exam: Vitals:   01/14/18 1548  BP: 116/63  Pulse: 97  Temp: 98.8 F (37.1 C)  TempSrc: Oral  SpO2: 100%  Weight: 158 lb 12.8 oz (72 kg)  Height: 4\' 11"  (1.499 m)   General: Obese female in no acute distress Pulm: Good air movement with no wheezing or crackles  CV: RRR, no murmurs, no rubs  Abdomen: Active bowel sounds, soft, non-distended, no tenderness to palpation  Extremities: No LE edema   Assessment & Plan:   See Encounters Tab for problem based charting.  Patient discussed with Dr. Beryle Beams

## 2018-01-14 NOTE — Patient Instructions (Signed)
Thank you for allowing [place] to provide your care. Please come back in 1 week so we can read your sensor.

## 2018-01-17 ENCOUNTER — Encounter: Payer: Self-pay | Admitting: Internal Medicine

## 2018-01-17 DIAGNOSIS — Z23 Encounter for immunization: Secondary | ICD-10-CM | POA: Insufficient documentation

## 2018-01-17 NOTE — Assessment & Plan Note (Signed)
Patient presenting for continued evaluation and management of their diabetes mellitus. Last A1c on 05/14/17 was found to be 6.7. Repeat A1c today at 6.9. Currently on Metformin 500 mg BID and glipizide 5 mg QD. Denies issues with medication adherence or cost. The patient recently started water aerobics. The patient is drink 1 pepsi a day and reports eating bread/pasta daily. Denies signs or symptoms of hypoglycemia.   CMG placed today.   Today we discussed continue lifestyle modification, both dietary and physically. Today we will not make any changes to her medication regimen. Patient will follow-up in 3 months. Patient is on a statin and an ACE/ARB.

## 2018-01-17 NOTE — Assessment & Plan Note (Signed)
Influenza vaccine given

## 2018-01-17 NOTE — Assessment & Plan Note (Signed)
Patient presenting for continued evaluation and management of their hypertension. BP is 116/63 today. Currently taking chlorthalidone, and lisinopril. The patient's weight is 158lbs which is stable. Patient recently started water aerobics. Patient avoiding excesses caffeine intake. Advised to avoid high salt foods. Denies dizziness with standing, headaches, visual changes, SOB, chest pain, N/V, abdominal pain.   Today we will not make any changes to her medication regimen. Patient advised to continue to work on lifestyle modification. Last labs checked on 07/10/17 with normal renal function. Patient will follow-up in 3 months.

## 2018-01-21 ENCOUNTER — Ambulatory Visit (INDEPENDENT_AMBULATORY_CARE_PROVIDER_SITE_OTHER): Payer: Medicare Other | Admitting: Internal Medicine

## 2018-01-21 ENCOUNTER — Other Ambulatory Visit: Payer: Self-pay

## 2018-01-21 ENCOUNTER — Ambulatory Visit: Payer: Medicare Other | Admitting: Pharmacist

## 2018-01-21 DIAGNOSIS — E1129 Type 2 diabetes mellitus with other diabetic kidney complication: Secondary | ICD-10-CM | POA: Diagnosis not present

## 2018-01-21 DIAGNOSIS — Z9221 Personal history of antineoplastic chemotherapy: Secondary | ICD-10-CM

## 2018-01-21 DIAGNOSIS — E1121 Type 2 diabetes mellitus with diabetic nephropathy: Secondary | ICD-10-CM

## 2018-01-21 DIAGNOSIS — N289 Disorder of kidney and ureter, unspecified: Secondary | ICD-10-CM

## 2018-01-21 DIAGNOSIS — Z9011 Acquired absence of right breast and nipple: Secondary | ICD-10-CM

## 2018-01-21 DIAGNOSIS — Z923 Personal history of irradiation: Secondary | ICD-10-CM

## 2018-01-21 DIAGNOSIS — Z7984 Long term (current) use of oral hypoglycemic drugs: Secondary | ICD-10-CM

## 2018-01-21 DIAGNOSIS — F329 Major depressive disorder, single episode, unspecified: Secondary | ICD-10-CM | POA: Diagnosis not present

## 2018-01-21 DIAGNOSIS — R4589 Other symptoms and signs involving emotional state: Secondary | ICD-10-CM | POA: Insufficient documentation

## 2018-01-21 DIAGNOSIS — I10 Essential (primary) hypertension: Secondary | ICD-10-CM

## 2018-01-21 DIAGNOSIS — Z853 Personal history of malignant neoplasm of breast: Secondary | ICD-10-CM

## 2018-01-21 DIAGNOSIS — M1711 Unilateral primary osteoarthritis, right knee: Secondary | ICD-10-CM

## 2018-01-21 MED ORDER — METFORMIN HCL ER 500 MG PO TB24: ORAL_TABLET | ORAL | 2 refills | Status: DC

## 2018-01-21 NOTE — Patient Instructions (Signed)
Stop glipizide Start the new metformin XR (sent to your pharmacy): take 500mg  (1 tab) with breakfast and 1000mg  (2 tabs) with dinner.  Please come back next week for your final download of the glucose meter.

## 2018-01-21 NOTE — Progress Notes (Signed)
   CC: diabetes  HPI:  [name] is a 72 y.o. with a PMH of T2DM, HTN, h/o breast cancer, OA presenting to clinic for follow up on DM.  Patient had CGM placed 1 wk ago; current A1c is 6.9 and current medications are glipizide 5mg  daily and metformin 500mg  BID (did not tolerate higher dose due to diarrhea). CGM captured consistent hypoglycemia overnight which patient states is associated with symptoms (shakiness, weakness) and is confirmed on her finger glucose checks; she states it has actually been happening for some months now.  Overall she reports decreased motivation, fatigue, poor appetite and decreased mood for about 9 months now. 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 She has spoken with her pastor and reached out to a local counselor through her daughter's church which she has found helpful; she plans on speaking with the counselor again soon. She has taken up water aerobics classes to increase her activity as well as art classes as she has always been very interested in pursuing art but didn't have time while she was working. Though she enjoys these, she still has to force herself to attend classes.   Please see problem based Assessment and Plan for status of patients chronic conditions.  Past Medical History:  Diagnosis Date  . Allergy   . Anemia   . Breast CA ([place])    (Rt) breast ca dx 4/12---  S/P RADICAL MASTECTOMY AND CHEMORADIATION  . Diabetes mellitus ORAL MED  . Fatty liver disease, nonalcoholic   . History of hyperkalemia   . Hx antineoplastic chemo  10/12 - 06/24/11   PACLITAX EL COMPLETED 06/24/11  . Hyperlipidemia   . Hypertension   . Mild nonproliferative diabetic retinopathy of right eye ([place]) 03/19/11   Dr. Joseph Art  . Numbness and tingling    Hx: of in fingers and toes since chemotherapy  . OA (osteoarthritis) of knee    RIGHT LEG  . Obesity   . Renal lesion     Review of Systems:   Per HPI  Physical Exam:  Vitals:   01/21/18 1412  BP: (!) 123/57  Pulse: 81  Temp: 98.6 F (37 C)  TempSrc: Oral  SpO2: 100%  Weight: 159 lb 4.8 oz (72.3 kg)  Height: 4\' 11"  (1.499 m)   GENERAL- alert, co-operative, appears as stated age, not in any distress. HEENT- oral mucosa appears moist. CARDIAC- RRR, no murmurs, rubs or gallops. RESP- Moving equal volumes of air, and clear to auscultation bilaterally, no wheezes or crackles. ABDOMEN- Soft, nontender, bowel sounds present. PSYCH- Normal mood and affect, appropriate thought content and speech.  Assessment & Plan:   See Encounters Tab for problem based charting.   Patient discussed with Dr. Abelardo Diesel, MD Internal Medicine PGY-3

## 2018-01-21 NOTE — Progress Notes (Signed)
Patient was seen today in a co-visit with Dr. Jari Favre

## 2018-01-21 NOTE — Assessment & Plan Note (Signed)
[name] wore the CGM for 8 days. The average reading was 111, % time in target was 59, % time below target was 28, and % time above target was. 13. Intervention will be to stop glipizide, change and increase to metformin XR 500mg  qAM and 1000mg  qPM. The patient will be scheduled to see pharmD and physician for a final appointment in 1 wk.   Further interventions to consider are: if tolerating higher dose metformin, continue to increase up to 1000mg  BID; or [name]

## 2018-01-21 NOTE — Assessment & Plan Note (Signed)
Overall she reports decreased motivation, fatigue, poor appetite and decreased mood for about 9 months now. 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 She has spoken with her pastor and reached out to a local counselor through her daughter's church which she has found helpful; she plans on speaking with the counselor again soon. She has taken up water aerobics classes to increase her activity as well as art classes as she has always been very interested in pursuing art but didn't have time while she was working. Though she enjoys these, she still has to force herself to attend classes.  Patient's symptoms could be due to chronic mild hypoglycemia; another consideration if glycemic control improves is MDD.  Plan: --re-assess symptoms after controlling hypoglycemic episodes and consider starting antidepressant; she does have OA so may benefit from cymbalta for both --encouraged continuing counseling and adding activities like her water aerobics and art classes

## 2018-01-25 NOTE — Progress Notes (Signed)
Internal Medicine Clinic Attending  Case discussed with Dr. Jari Favre at the time of the visit. We reviewed the resident's history and exam and pertinent patient test results. I personally reviewed the CGM data & the resident's interpretation. I agree with the assessment, diagnosis, and plan of care documented in the resident's note.

## 2018-01-28 ENCOUNTER — Encounter: Payer: Self-pay | Admitting: Dietician

## 2018-01-28 ENCOUNTER — Ambulatory Visit (INDEPENDENT_AMBULATORY_CARE_PROVIDER_SITE_OTHER): Payer: Medicare Other | Admitting: Internal Medicine

## 2018-01-28 ENCOUNTER — Other Ambulatory Visit: Payer: Self-pay

## 2018-01-28 ENCOUNTER — Ambulatory Visit: Payer: Medicare Other | Admitting: Dietician

## 2018-01-28 ENCOUNTER — Telehealth: Payer: Self-pay | Admitting: Internal Medicine

## 2018-01-28 ENCOUNTER — Encounter: Payer: Self-pay | Admitting: Internal Medicine

## 2018-01-28 VITALS — BP 118/59 | HR 90 | Temp 98.3°F | Ht 59.0 in | Wt 158.2 lb

## 2018-01-28 DIAGNOSIS — E1121 Type 2 diabetes mellitus with diabetic nephropathy: Secondary | ICD-10-CM

## 2018-01-28 DIAGNOSIS — R197 Diarrhea, unspecified: Secondary | ICD-10-CM | POA: Diagnosis not present

## 2018-01-28 DIAGNOSIS — I10 Essential (primary) hypertension: Secondary | ICD-10-CM

## 2018-01-28 DIAGNOSIS — Z7984 Long term (current) use of oral hypoglycemic drugs: Secondary | ICD-10-CM

## 2018-01-28 MED ORDER — SITAGLIP PHOS-METFORMIN HCL ER 50-500 MG PO TB24: 50.0000 mg | ORAL_TABLET | Freq: Two times a day (BID) | ORAL | 2 refills | Status: DC

## 2018-01-28 NOTE — Progress Notes (Signed)
   CC: Diabetes  HPI:  [name] is a 72 y.o. with a PMH of T2DM, HTN, h/o breast cancer, OA presenting to clinic for follow up on DM.  At last visit, patient was found to have hypoglycemia on glipizide so this was stopped; her metformin was switched to XR and she was asked to increase to 500mg  in the morning and 1000mg  in the afternoon. Since then she states she has felt significantly better with more energy, however last night she started having diarrhea soon after dinner and taking her metformin. She attributes the diarrhea to metformin. She has mild cramping abdominal pain; denies melena. She has not tried anything for the diarrhea yet.  Please see problem based Assessment and Plan for status of patients chronic conditions.  Past Medical History:  Diagnosis Date  . Allergy   . Anemia   . Breast CA ([place])    (Rt) breast ca dx 4/12---  S/P RADICAL MASTECTOMY AND CHEMORADIATION  . Diabetes mellitus ORAL MED  . Fatty liver disease, nonalcoholic   . History of hyperkalemia   . Hx antineoplastic chemo  10/12 - 06/24/11   PACLITAX EL COMPLETED 06/24/11  . Hyperlipidemia   . Hypertension   . Mild nonproliferative diabetic retinopathy of right eye ([place]) 03/19/11   Dr. Joseph Ryan  . Numbness and tingling    Hx: of in fingers and toes since chemotherapy  . OA (osteoarthritis) of knee    RIGHT LEG  . Obesity   . Renal lesion     Review of Systems:   Per HPI  Physical Exam:  Vitals:   01/28/18 1314  BP: (!) 118/59  Pulse: 90  Temp: 98.3 F (36.8 C)  TempSrc: Oral  SpO2: 99%  Weight: 158 lb 3.2 oz (71.8 kg)  Height: 4\' 11"  (1.499 m)   GENERAL- alert, co-operative, appears as stated age, not in any distress. HEENT- oral mucosa appears moist RESP- no increased work of breathing ABDOMEN- soft, NDNT  Assessment & Plan:   See Encounters Tab for problem based charting.   Patient discussed with Dr. Andris Baumann, MD Internal Medicine PGY-3

## 2018-01-28 NOTE — Assessment & Plan Note (Addendum)
Patient with <1d history of diarrhea; etiology could be from metformin however less likely as she tolerated the higher dose for several days before the diarrhea began. Therefore etiology is likely food borne illness related. She is afebrile, with stable VS. Will decrease dose of metformin; advised to treat symptomatically with fluids, pepto/kaopectate/imodium and bland foods. Advised to return to clinic if symptoms are not resolving or new symptoms arise.

## 2018-01-28 NOTE — Assessment & Plan Note (Signed)
[name] wore the CGM for 15 days. The average reading was 122, % time in target was 72, % time below target was 14 (only occurred while taking glipizide), and % time above target was. 14. Intervention will be to change to janumet 50-500mg  BID with meals.  Due to patient's concern that diarrhea is caused by metformin, will go back to the tolerated dose of 500mg  twice daily and add on sitagliptin.  She will f/u with her PCP in 3 months for A1c and re-evaluation.

## 2018-01-28 NOTE — Progress Notes (Signed)
Assisted Dr. Jari Favre with CGM download #2 and removal of Continuous glucose monitoring sensor.offered assistance with interpretation of cgm download, and questions/concerns about her diabetes, meal planning, activities, etc.  Recommend follow up appointment for annual diabetes self management and support visit and Medical Nutrition Therapy. Last visit with dietitian was 2012.  Debera Lat, RD 01/28/2018 1:41 PM.

## 2018-01-28 NOTE — Patient Instructions (Signed)
Change your medicine to Sitagliptin-metformin (Janumet) 50-500mg  twice daily with meals.   Make an appointment in about 3 month with Dr. Tarri Abernethy; we'd be happy to see you before then if you need [place]!

## 2018-01-28 NOTE — Patient Instructions (Addendum)
  Mrs. Reedy,   I recommend you schedule an apopintment for diabetes self management and support/Medical Nutrition Therapy. This is a yearly covered benefit for all Medicare plans.  Sincerely,  Debera Lat, [place], [place]

## 2018-02-02 NOTE — Progress Notes (Signed)
Case discussed with Dr. Svalina at the time of the visit. We reviewed the resident's history and exam and pertinent patient test results. I agree with the assessment, diagnosis, and plan of care documented in the resident's note. 

## 2018-02-04 ENCOUNTER — Ambulatory Visit: Payer: Medicare Other | Admitting: Pharmacist

## 2018-03-23 ENCOUNTER — Other Ambulatory Visit: Payer: Self-pay | Admitting: Internal Medicine

## 2018-03-23 DIAGNOSIS — E1121 Type 2 diabetes mellitus with diabetic nephropathy: Secondary | ICD-10-CM

## 2018-04-11 ENCOUNTER — Other Ambulatory Visit: Payer: Self-pay | Admitting: Internal Medicine

## 2018-04-11 DIAGNOSIS — E1121 Type 2 diabetes mellitus with diabetic nephropathy: Secondary | ICD-10-CM

## 2018-04-11 DIAGNOSIS — I152 Hypertension secondary to endocrine disorders: Secondary | ICD-10-CM

## 2018-04-11 DIAGNOSIS — E1159 Type 2 diabetes mellitus with other circulatory complications: Secondary | ICD-10-CM

## 2018-04-11 DIAGNOSIS — I10 Essential (primary) hypertension: Secondary | ICD-10-CM

## 2018-04-19 ENCOUNTER — Encounter: Payer: Self-pay | Admitting: Internal Medicine

## 2018-04-19 ENCOUNTER — Ambulatory Visit (INDEPENDENT_AMBULATORY_CARE_PROVIDER_SITE_OTHER): Payer: Medicare Other | Admitting: Internal Medicine

## 2018-04-19 VITALS — BP 109/56 | HR 96 | Temp 98.6°F | Wt 154.5 lb

## 2018-04-19 DIAGNOSIS — Z0000 Encounter for general adult medical examination without abnormal findings: Secondary | ICD-10-CM

## 2018-04-19 DIAGNOSIS — N289 Disorder of kidney and ureter, unspecified: Secondary | ICD-10-CM | POA: Diagnosis not present

## 2018-04-19 DIAGNOSIS — E1129 Type 2 diabetes mellitus with other diabetic kidney complication: Secondary | ICD-10-CM | POA: Diagnosis not present

## 2018-04-19 DIAGNOSIS — E1159 Type 2 diabetes mellitus with other circulatory complications: Secondary | ICD-10-CM | POA: Diagnosis not present

## 2018-04-19 DIAGNOSIS — E1121 Type 2 diabetes mellitus with diabetic nephropathy: Secondary | ICD-10-CM

## 2018-04-19 DIAGNOSIS — I152 Hypertension secondary to endocrine disorders: Secondary | ICD-10-CM

## 2018-04-19 DIAGNOSIS — I10 Essential (primary) hypertension: Secondary | ICD-10-CM | POA: Diagnosis not present

## 2018-04-19 DIAGNOSIS — Z79899 Other long term (current) drug therapy: Secondary | ICD-10-CM

## 2018-04-19 LAB — POCT GLYCOSYLATED HEMOGLOBIN (HGB A1C): Hemoglobin A1C: 7.2 % — AB (ref 4.0–5.6)

## 2018-04-19 LAB — GLUCOSE, CAPILLARY: Glucose-Capillary: 268 mg/dL — ABNORMAL HIGH (ref 70–99)

## 2018-04-19 MED ORDER — DAPAGLIFLOZIN PROPANEDIOL 5 MG PO TABS: 5.0000 mg | ORAL_TABLET | Freq: Every day | ORAL | 1 refills | Status: DC

## 2018-04-19 NOTE — Patient Instructions (Signed)
Thank you for allowing me to provide your care. Today we are starting a new medication for your diabetes. I will have it sent to your pharmacy by the end of the day. The most common side effects with this medication is increased risk of yeast infections. If you noticed increased itching please call me and I can send out an additional medication. Continue to work on [employer] and exercise.   I hope you and your family have a blessed Christmas and New Year's. I will see you back in six months or sooner if any issues arise.

## 2018-04-19 NOTE — Progress Notes (Signed)
   CC: F/U DM, HTN  HPI:  [name] is a 72 y.o. female with PMHx listed below presenting for continued evaluation and management of her chronic medical illnesses. Please see the A&P for the status of the patient's chronic medical problems.  Past Medical History:  Diagnosis Date  . Allergy   . Anemia   . Breast CA ([place])    (Rt) breast ca dx 4/12---  S/P RADICAL MASTECTOMY AND CHEMORADIATION  . Diabetes mellitus ORAL MED  . Fatty liver disease, nonalcoholic   . History of hyperkalemia   . Hx antineoplastic chemo  10/12 - 06/24/11   PACLITAX EL COMPLETED 06/24/11  . Hyperlipidemia   . Hypertension   . Mild nonproliferative diabetic retinopathy of right eye ([place]) 03/19/11   Dr. Joseph Art  . Numbness and tingling    Hx: of in fingers and toes since chemotherapy  . OA (osteoarthritis) of knee    RIGHT LEG  . Obesity   . Renal lesion    Review of Systems: Performed and all others negative.  Physical Exam: Vitals:   04/19/18 1430 04/19/18 1459  BP: (!) 115/92 (!) 109/56  Pulse: (!) 105 96  Temp: 98.6 F (37 C)   TempSrc: Oral   SpO2: 100%   Weight: 154 lb 8 oz (70.1 kg)    General: Well nourished female in no acute distress Pulm: Good air movement with no wheezing or crackles  CV: RRR, no murmurs, no rubs  Abdomen: Active bowel sounds, soft, non-distended, no tenderness to palpation  Extremities: Pulses palpable in all extremities, no LE edema   Assessment & Plan:   See Encounters Tab for problem based charting.  Patient discussed with Dr. Eppie Gibson

## 2018-04-20 ENCOUNTER — Encounter: Payer: Self-pay | Admitting: Internal Medicine

## 2018-04-20 NOTE — Assessment & Plan Note (Signed)
HPI: Patient presents for continued evaluation and management of her well-controlled hypertension. She is currently on chlorthalidone 25 mg q.d., and lisinopril 20 mg q.d. She is tolerating both of these medications without side effects. She denies visual changes, headaches, chest pain, shortness of breath, orthostatic changes.  Assessment/plan: Patient presents for continued evaluation and management of her well-controlled hypertension on dual antihypertensive therapy including chlorthalidone 25 mg and lisinopril 20 mg. Review of records indicates that she had a BMP in March 2019 with normal renal function and potassium. She will need this repeated at her next visit. We will not make any changes to her medical management at this point.

## 2018-04-20 NOTE — Assessment & Plan Note (Signed)
HPI: patient presents for continued evaluation and management of her controlled type II diabetes mellitus. She is currently not on any medications. She was supposed to be on sitagliptin/metformin however stopped this due to excessive diarrhea. She last took this back in September when she was evaluated in the clinic. Prior to this she was on glipizide as well but was experiencing hypoglycemia so this medication was discontinued. Since that time she has worked to become more active and lose weight. She states that she is trying to work up to walking at least 1 mile per day and frequently swims. She does struggle with her diet and states that she really enjoys eating bread. She denies any episodes of feeling shaky, sweaty, weak, confused.  Assessment/plan: patient presents for continued evaluation and management of her well-controlled type II diabetes mellitus. She is currently not on any medications secondary to side effects. Her A1c today is 7.2. Given her age I feel that this is an appropriate A1c; however, we will add Dapagliflozin 5 mg daily. She will follow-up in six months and if her A1c is down we will again do a trial off medications. She is currently on an ACE inhibitor and a high intensity statin. She gets yearly optho exams but we do not have the records. I have asked her to ask her ophthalmologist to send them to our office. She received a foot exam today.

## 2018-04-22 ENCOUNTER — Encounter: Payer: Medicare Other | Admitting: Internal Medicine

## 2018-04-22 ENCOUNTER — Telehealth: Payer: Self-pay | Admitting: *Deleted

## 2018-04-22 DIAGNOSIS — E1121 Type 2 diabetes mellitus with diabetic nephropathy: Secondary | ICD-10-CM

## 2018-04-22 NOTE — Telephone Encounter (Signed)
Call to [facility] to give information for Prior Authorization for [place].  Information for alternative preferred drugs to be faxed.  Jardiance, Metformin ER, Metformin and  Rimet  Will forward to Dr  Tarri Abernethy to consider change. Return fax to [facility] at [phone]. Sander Nephew, RN 04/22/2018 11:35 AM.

## 2018-04-22 NOTE — Progress Notes (Signed)
Case discussed with Dr. Helberg at the time of the visit. We reviewed the resident's history and exam and pertinent patient test results. I agree with the assessment, diagnosis, and plan of care documented in the resident's note. 

## 2018-04-23 ENCOUNTER — Encounter: Payer: Self-pay | Admitting: Student in an Organized Health Care Education/Training Program

## 2018-04-23 MED ORDER — EMPAGLIFLOZIN 10 MG PO TABS: 10.0000 mg | ORAL_TABLET | Freq: Every day | ORAL | 1 refills | Status: DC

## 2018-04-23 NOTE — Telephone Encounter (Signed)
Changed for Wilder Glade to [place] due to insurance not covering [place].

## 2018-05-06 ENCOUNTER — Other Ambulatory Visit: Payer: Self-pay

## 2018-05-06 NOTE — Telephone Encounter (Signed)
Question about meds, please call pt back.  

## 2018-05-06 NOTE — Telephone Encounter (Signed)
Rtc, lm for rtc 

## 2018-05-11 ENCOUNTER — Other Ambulatory Visit: Payer: Self-pay | Admitting: Internal Medicine

## 2018-05-11 ENCOUNTER — Telehealth: Payer: Self-pay | Admitting: *Deleted

## 2018-05-11 DIAGNOSIS — I152 Hypertension secondary to endocrine disorders: Secondary | ICD-10-CM

## 2018-05-11 DIAGNOSIS — E1159 Type 2 diabetes mellitus with other circulatory complications: Secondary | ICD-10-CM

## 2018-05-11 DIAGNOSIS — I10 Essential (primary) hypertension: Principal | ICD-10-CM

## 2018-05-11 NOTE — Telephone Encounter (Signed)
Pt called and ask if she should be taking jardiance and metformin, spoke w/ dr Maudie Mercury and reviewed notes, only jardiance at this time. Do you agree?

## 2018-05-12 NOTE — Telephone Encounter (Signed)
That's correct. Thank you.

## 2018-06-21 ENCOUNTER — Other Ambulatory Visit: Payer: Self-pay

## 2018-06-21 ENCOUNTER — Ambulatory Visit (INDEPENDENT_AMBULATORY_CARE_PROVIDER_SITE_OTHER): Payer: Medicare Other | Admitting: Internal Medicine

## 2018-06-21 ENCOUNTER — Encounter: Payer: Self-pay | Admitting: Internal Medicine

## 2018-06-21 VITALS — BP 118/68 | HR 98 | Temp 99.5°F | Ht 59.0 in | Wt 153.7 lb

## 2018-06-21 DIAGNOSIS — M25532 Pain in left wrist: Secondary | ICD-10-CM

## 2018-06-21 DIAGNOSIS — G5602 Carpal tunnel syndrome, left upper limb: Secondary | ICD-10-CM

## 2018-06-21 DIAGNOSIS — J029 Acute pharyngitis, unspecified: Secondary | ICD-10-CM

## 2018-06-21 DIAGNOSIS — R0981 Nasal congestion: Secondary | ICD-10-CM | POA: Diagnosis not present

## 2018-06-21 DIAGNOSIS — R51 Headache: Secondary | ICD-10-CM

## 2018-06-21 DIAGNOSIS — Z8739 Personal history of other diseases of the musculoskeletal system and connective tissue: Secondary | ICD-10-CM | POA: Diagnosis not present

## 2018-06-21 MED ORDER — FLUTICASONE PROPIONATE 50 MCG/ACT NA SUSP: 2.0000 | Freq: Every day | NASAL | 0 refills | Status: DC

## 2018-06-21 NOTE — Patient Instructions (Addendum)
[name],   To help with your headache and congestion please use Flonase, this is a decongestant.  You can spray 1 time in each nare.  You can buy this over-the-counter.  I expect your symptoms will start to improve in the next few days.  For your arm pain, I have referred you to hand surgery for evaluation for carpal tunnel surgery.  They will give you a call to schedule an appointment.  Call us if you have any questions or concerns.  -Dr. Frederico Hamman

## 2018-06-22 ENCOUNTER — Encounter: Payer: Self-pay | Admitting: Internal Medicine

## 2018-06-22 DIAGNOSIS — M25532 Pain in left wrist: Secondary | ICD-10-CM | POA: Insufficient documentation

## 2018-06-22 DIAGNOSIS — R0981 Nasal congestion: Secondary | ICD-10-CM | POA: Insufficient documentation

## 2018-06-22 NOTE — Progress Notes (Signed)
Internal Medicine Clinic Attending  Case discussed with Dr. Santos-Sanchez at the time of the visit.  We reviewed the resident's history and exam and pertinent patient test results.  I agree with the assessment, diagnosis, and plan of care documented in the resident's note.    

## 2018-06-22 NOTE — Progress Notes (Signed)
   CC: Congestion and left wrist pain  HPI:  [name] is a 73 y.o. year-old female with PMH listed below who presents to clinic for evaluation of congestion and left wrist pain. Please see problem based assessment and plan for further details.   Past Medical History:  Diagnosis Date  . Allergy   . Anemia   . Breast CA ([place])    (Rt) breast ca dx 4/12---  S/P RADICAL MASTECTOMY AND CHEMORADIATION  . Diabetes mellitus ORAL MED  . Fatty liver disease, nonalcoholic   . History of hyperkalemia   . Hx antineoplastic chemo  10/12 - 06/24/11   PACLITAX EL COMPLETED 06/24/11  . Hyperlipidemia   . Hypertension   . Mild nonproliferative diabetic retinopathy of right eye ([place]) 03/19/11   Dr. Joseph Art  . Numbness and tingling    Hx: of in fingers and toes since chemotherapy  . OA (osteoarthritis) of knee    RIGHT LEG  . Obesity   . Renal lesion    Review of Systems:   Review of Systems  Constitutional: Negative for chills and fever.  HENT: Positive for congestion, sinus pain and sore throat.   Respiratory: Negative for cough and shortness of breath.   Musculoskeletal: Positive for joint pain. Negative for myalgias.  Neurological: Positive for headaches.    Physical Exam: Vitals:   06/21/18 1504  BP: 118/68  Pulse: 98  Temp: 99.5 F (37.5 C)  TempSrc: Oral  SpO2: 100%  Weight: 153 lb 11.2 oz (69.7 kg)  Height: 4\' 11"  (1.499 m)    General: Tired appearing female in no acute distress HENT: NCAT, neck supple and FROM, no LAD, OP clear without exudates or erythema  Cardiac: regular rate and rhythm, nl S1/S2, no murmurs, rubs or gallops, no JVD  Pulm: CTAB, no wheezes or crackles, no increased work of breathing on room air MSK: positive Tinel sign on L wrist, no thenar atrophy    Assessment & Plan:   See Encounters Tab for problem based charting.  Patient discussed with Dr. Angelia Mould

## 2018-06-22 NOTE — Assessment & Plan Note (Signed)
[name] has a history of bilateral carpal tunnel syndrome and presents with worsening left wrist pain and tingling sensation that is worse at night over the past month.  She used to wear braces for this but they are no longer helping with symptoms.  Shaking her hands helps sometimes.  She was offered referral for surgery in the past due to severity of symptoms but declined at the time as she wanted to continue trying conservative management.  However, she is not having difficulty sleeping due to pain and would like to discuss her surgical options.  Referral to hand surgery made for carpal tunnel surgery evaluation.

## 2018-06-22 NOTE — Assessment & Plan Note (Addendum)
[name] reports a 4-day history of nasal congestion followed by a sore throat and a headache that started today.  Her headache is located at the frontal sinus. She endorses feeling more tired than usual, though able to perform her activities of daily living without difficulty.  She denies fevers, chills, shortness of breath or cough. She has sick contacts at home with similar symptoms. She has not tried anything for symptom relief.  She reports overall her symptoms are not getting worse, but she is concerned they are not going away.  On exam there is no frontal sinus tenderness, eyes are watery, oropharynx is clear, and lungs are clear to auscultation bilaterally.  Suspect this could be URI versus sinusitis versus allergic rhinitis.  Recommended nasal decongestant Flonase.  Return precautions discussed.

## 2018-06-29 ENCOUNTER — Ambulatory Visit (INDEPENDENT_AMBULATORY_CARE_PROVIDER_SITE_OTHER): Payer: Medicare Other

## 2018-06-29 ENCOUNTER — Encounter (INDEPENDENT_AMBULATORY_CARE_PROVIDER_SITE_OTHER): Payer: Self-pay | Admitting: Orthopaedic Surgery

## 2018-06-29 ENCOUNTER — Ambulatory Visit (INDEPENDENT_AMBULATORY_CARE_PROVIDER_SITE_OTHER): Payer: Medicare Other | Admitting: Orthopaedic Surgery

## 2018-06-29 VITALS — Ht 59.0 in | Wt 153.0 lb

## 2018-06-29 DIAGNOSIS — G5603 Carpal tunnel syndrome, bilateral upper limbs: Secondary | ICD-10-CM

## 2018-06-29 DIAGNOSIS — M542 Cervicalgia: Secondary | ICD-10-CM | POA: Diagnosis not present

## 2018-06-29 HISTORY — DX: Carpal tunnel syndrome, bilateral upper limbs: G56.03

## 2018-06-29 MED ORDER — METHYLPREDNISOLONE 4 MG PO TBPK: ORAL_TABLET | ORAL | 0 refills | Status: DC

## 2018-06-29 NOTE — Progress Notes (Signed)
Office Visit Note   Patient: [name]           Date of Birth: 1945-11-17           MRN: [id number] Visit Date: 06/29/2018              Requested by: Axel Filler, MD [address] [place], [place] [address] PCP: Ina Homes, MD   Assessment & Plan: Visit Diagnoses:  1. Neck pain   2. Carpal tunnel syndrome, bilateral     Plan: Impression is DISH and possible bilateral left greater than right carpal tunnel syndrome.  We will refer the patient to Dr. Ernestina Patches for nerve conduction study/EMG bilateral upper extremity.  She will follow-up with [place] once that has been completed.  In the meantime, we will start her on a methylprednisolone pack.  Follow-Up Instructions: Return in about 2 weeks (around 07/13/2018) for to discuss NWS/EMG.   Orders:  Orders Placed This Encounter  Procedures  . XR Cervical Spine 2 or 3 views  . Ambulatory referral to Physical Medicine Rehab   Meds ordered this encounter  Medications  . methylPREDNISolone (MEDROL DOSEPAK) 4 MG TBPK tablet    Sig: Take as directed    Dispense:  21 tablet    Refill:  0      Procedures: No procedures performed   Clinical Data: No additional findings.   Subjective: Chief Complaint  Patient presents with  . Neck - Pain    HPI patient is a pleasant 73 year old right-hand-dominant female who presents to our clinic today with left upper extremity pain.  This has been ongoing for over the past year.  The pain she has is to the lateral side of her neck and radiates down the entire left arm and into the hand.  She describes this as a constant ache.  Increased pain when she makes a fist.  She has not tried any medications for this.  She does have a longstanding history of working in a [employer] where she was diagnosed with bilateral carpal tunnel syndrome over 2 decades ago.  She has worn wrist splints which no longer help.  She notes that her husband would never let her have the surgery.  She  is now having numbness and tingling to the entire hand.  However, she does note that this started occurring after having chemotherapy treatments for breast cancer back in 2012.  No history of autoimmune disease.  No history of cervical spine pathology.  Of note, she does have similar but more mild symptoms to the right hand.  Review of Systems as detailed in HPI.  All others reviewed and are negative.   Objective: Vital Signs: Ht 4\' 11"  (1.499 m)   Wt 153 lb (69.4 kg)   BMI 30.90 kg/m   Physical Exam well-developed well-nourished female in no acute distress.  Alert and oriented x3.  Ortho Exam examination of her neck reveals near full range of motion all planes.  Mild pain with extension and flexion.  No spinous or paraspinous tenderness.  Examination of her left shoulder is normal.  No increased numbness, tingling or burning with Phalen or Tinel.  No thenar atrophy.  Full sensation distally.  Specialty Comments:  No specialty comments available.  Imaging: Xr Cervical Spine 2 Or 3 Views  Result Date: 06/29/2018 X-rays of the cervical spine reveal findings consistent with diffuse idiopathic skeletal hyperostosis    PMFS History: Patient Active Problem List   Diagnosis Date Noted  .  Carpal tunnel syndrome, bilateral 06/29/2018  . Neck pain 06/29/2018  . Left wrist pain 06/22/2018  . Nasal congestion 06/22/2018  . Diarrhea 01/28/2018  . Depressed mood 01/21/2018  . Need for immunization against influenza 01/17/2018  . Pancreatitis 07/01/2016  . Constipation 01/15/2016  . Allergic urticaria 12/21/2015  . Poor appetite 11/27/2015  . Ganglion cyst 07/12/2015  . Piriformis syndrome of right side 03/02/2014  . Osteopenia 12/13/2013  . Vaginal dryness 12/13/2013  . Abdominal pain 11/02/2013  . Peripheral neuropathy 11/02/2013  . Bilateral hand numbness 09/02/2013  . Health care maintenance 11/17/2012  . Infection of prosthetic knee joint ([place]) 09/03/2012  . Arthritis of knee,  right 08/16/2012  . Breast cancer, right breast ([place]) 09/25/2010  . COLONIC POLYPS, HX OF 07/19/2007  . OBESITY NOS 07/09/2006  . Type 2 diabetes mellitus with renal manifestations ([place]) 03/10/2006  . Hyperlipidemia 03/10/2006  . Hypertension associated with diabetes ([place]) 03/10/2006  . ALLERGIC RHINITIS 03/10/2006  . Fatty liver 03/10/2006   Past Medical History:  Diagnosis Date  . Allergy   . Anemia   . Breast CA ([place])    (Rt) breast ca dx 4/12---  S/P RADICAL MASTECTOMY AND CHEMORADIATION  . Diabetes mellitus ORAL MED  . Fatty liver disease, nonalcoholic   . History of hyperkalemia   . Hx antineoplastic chemo  10/12 - 06/24/11   PACLITAX EL COMPLETED 06/24/11  . Hyperlipidemia   . Hypertension   . Mild nonproliferative diabetic retinopathy of right eye ([place]) 03/19/11   Dr. Joseph Art  . Numbness and tingling    Hx: of in fingers and toes since chemotherapy  . OA (osteoarthritis) of knee    RIGHT LEG  . Obesity   . Renal lesion     Family History  Problem Relation Age of Onset  . Stroke Father   . Heart disease Sister   . Cancer Sister        breast  . Heart disease Brother   . Cancer Sister        COLON CANCER    Past Surgical History:  Procedure Laterality Date  . COLONOSCOPY W/ POLYPECTOMY     Hx; of  . I&D KNEE WITH POLY EXCHANGE Right 09/03/2012   Procedure: IRRIGATION AND DEBRIDEMENT RIGHT KNEE WITH POLY EXCHANGE;  Surgeon: Kerin Salen, MD;  Location: [facility];  Service: Orthopedics;  Laterality: Right;  . knee arthroscopy  10-28-1999   RIGHT  . Laparoscopy with laparoscopic right salpingo oophorectomy and lysis of pelvic and abdominal adhesions  March 2013   Dr. Nori Riis   . MASTECTOMY MODIFIED RADICAL  12-04-2010   W/ LEFT PAC PLACEMENT (RIGHT BREAST W/ AXILLARY CONTENTS/ NODE BX'S  . PORT-A-CATH REMOVAL  09/24/2011   Procedure: REMOVAL PORT-A-CATH;  Surgeon: Joyice Faster. Cornett, MD;  Location: [facility];  Service: General;  Laterality: N/A;  . PORTACATH  PLACEMENT     REPLACED PAC DUE TO MALFUNCTION (LEFT)  . TOTAL KNEE ARTHROPLASTY Right 08/16/2012   Procedure: TOTAL KNEE ARTHROPLASTY;  Surgeon: Kerin Salen, MD;  Location: [facility];  Service: Orthopedics;  Laterality: Right;  . TRANSTHORACIC ECHOCARDIOGRAM  10-29-2010   NORMAL LVF/ EF 55-60%/ MILD MV REGURG  . TUBAL LIGATION  YRS AGO  . VAGINAL HYSTERECTOMY  AGE 64   W/ LSO   Social History   Occupational History  . Not on file  Tobacco Use  . Smoking status: Never Smoker  . Smokeless tobacco: Never Used  Substance and Sexual Activity  .  Alcohol use: No    Alcohol/week: 0.0 standard drinks  . Drug use: No    Types: Flunitrazepam  . Sexual activity: Not on file    Comment: MENARCHE AGE 89, G4, P4, PARITY AGE 71, HRT 20+ YEARS

## 2018-07-16 ENCOUNTER — Encounter (INDEPENDENT_AMBULATORY_CARE_PROVIDER_SITE_OTHER): Payer: Self-pay | Admitting: Physical Medicine and Rehabilitation

## 2018-07-16 ENCOUNTER — Ambulatory Visit (INDEPENDENT_AMBULATORY_CARE_PROVIDER_SITE_OTHER): Payer: Medicare Other | Admitting: Physical Medicine and Rehabilitation

## 2018-07-16 ENCOUNTER — Other Ambulatory Visit: Payer: Self-pay

## 2018-07-16 DIAGNOSIS — M542 Cervicalgia: Secondary | ICD-10-CM

## 2018-07-16 DIAGNOSIS — E1121 Type 2 diabetes mellitus with diabetic nephropathy: Secondary | ICD-10-CM

## 2018-07-16 DIAGNOSIS — R202 Paresthesia of skin: Secondary | ICD-10-CM | POA: Diagnosis not present

## 2018-07-16 DIAGNOSIS — G6289 Other specified polyneuropathies: Secondary | ICD-10-CM

## 2018-07-16 NOTE — Progress Notes (Signed)
 .  Numeric Pain Rating Scale and Functional Assessment Average Pain 3   In the last MONTH (on 0-10 scale) has pain interfered with the following?  1. General activity like being  able to carry out your everyday physical activities such as walking, climbing stairs, carrying groceries, or moving a chair?  Rating(4)

## 2018-07-19 NOTE — Procedures (Signed)
EMG & NCV Findings: Evaluation of the left median motor and the right median motor nerves showed prolonged distal onset latency (L6.8, R7.9 ms) and decreased conduction velocity (Elbow-Wrist, L43, R43 m/s).  The left median (across palm) sensory and the right median (across palm) sensory nerves showed prolonged distal peak latency (Wrist, L6.4, R8.6 ms), reduced amplitude (L6.8, R0.6 V), and prolonged distal peak latency (Palm, L8.0, R8.2 ms).  All remaining nerves (as indicated in the following tables) were within normal limits.  Left vs. Right side comparison data for the median motor nerve indicates abnormal L-R latency difference (1.1 ms).  All remaining left vs. right side differences were within normal limits.    All examined muscles (as indicated in the following table) showed no evidence of electrical instability.    Impression: The above electrodiagnostic study is ABNORMAL and reveals evidence of a moderate to severe BILATERAL median nerve entrapment at the wrist (carpal tunnel syndrome) affecting sensory and motor components.   There is no significant electrodiagnostic evidence of any other focal nerve entrapment, brachial plexopathy or cervical radiculopathy.   Recommendations: 1.  Follow-up with referring physician. 2.  Continue current management of symptoms. 3.  Continue use of resting splint at night-time and as needed during the day. 4.  Suggest surgical evaluation.  ___________________________ Laurence Spates FAAPMR Board Certified, American Board of Physical Medicine and Rehabilitation    Nerve Conduction Studies Anti Sensory Summary Table   Stim Site NR Peak (ms) Norm Peak (ms) P-T Amp (V) Norm P-T Amp Site1 Site2 Delta-P (ms) Dist (cm) Vel (m/s) Norm Vel (m/s)  Left Median Acr Palm Anti Sensory (2nd Digit)  33.7C  Wrist    *6.4 <3.6 *6.8 >10 Wrist Palm 1.6 0.0    Palm    *8.0 <2.0 5.5         Right Median Acr Palm Anti Sensory (2nd Digit)  33.1C  Wrist    *8.6 <3.6  *0.6 >10 Wrist Palm 0.4 0.0    Palm    *8.2 <2.0 2.9         Site 3    4.0  2.4         Left Radial Anti Sensory (Base 1st Digit)  35.8C  Wrist    2.2 <3.1 11.9  Wrist Base 1st Digit 2.2 0.0    Right Radial Anti Sensory (Base 1st Digit)  32.2C  Wrist    2.3 <3.1 12.3  Wrist Base 1st Digit 2.3 0.0    Left Ulnar Anti Sensory (5th Digit)  34C  Wrist    3.2 <3.7 17.7 >15.0 Wrist 5th Digit 3.2 14.0 44 >38  Right Ulnar Anti Sensory (5th Digit)  33C  Wrist    3.2 <3.7 22.4 >15.0 Wrist 5th Digit 3.2 14.0 44 >38   Motor Summary Table   Stim Site NR Onset (ms) Norm Onset (ms) O-P Amp (mV) Norm O-P Amp Site1 Site2 Delta-0 (ms) Dist (cm) Vel (m/s) Norm Vel (m/s)  Left Median Motor (Abd Poll Brev)  36C  Wrist    *6.8 <4.2 5.5 >5 Elbow Wrist 4.4 19.0 *43 >50  Elbow    11.2  5.1         Right Median Motor (Abd Poll Brev)  32C  Wrist    *7.9 <4.2 6.1 >5 Elbow Wrist 4.5 19.5 *43 >50  Elbow    12.4  1.8         Left Ulnar Motor (Abd Dig Min)  35.5C  Wrist    3.0 <  4.2 7.6 >3 B Elbow Wrist 2.9 19.0 66 >53  B Elbow    5.9  6.2  A Elbow B Elbow 1.4 9.0 64 >53  A Elbow    7.3  3.9         Right Ulnar Motor (Abd Dig Min)  32C  Wrist    2.8 <4.2 7.7 >3 B Elbow Wrist 3.1 18.0 58 >53  B Elbow    5.9  6.6  A Elbow B Elbow 1.3 10.0 77 >53  A Elbow    7.2  4.5          EMG   Side Muscle Nerve Root Ins Act Fibs Psw Amp Dur Poly Recrt Int Fraser Din Comment  Left Abd Poll Brev Median C8-T1 Nml Nml Nml Nml Nml 0 Nml Nml   Left 1stDorInt Ulnar C8-T1 Nml Nml Nml Nml Nml 0 Nml Nml   Left PronatorTeres Median C6-7 Nml Nml Nml Nml Nml 0 Nml Nml   Left Biceps Musculocut C5-6 Nml Nml Nml Nml Nml 0 Nml Nml   Left Deltoid Axillary C5-6 Nml Nml Nml Nml Nml 0 Nml Nml     Nerve Conduction Studies Anti Sensory Left/Right Comparison   Stim Site L Lat (ms) R Lat (ms) L-R Lat (ms) L Amp (V) R Amp (V) L-R Amp (%) Site1 Site2 L Vel (m/s) R Vel (m/s) L-R Vel (m/s)  Median Acr Palm Anti Sensory (2nd Digit)  33.7C  Wrist  *6.4 *8.6 2.2 *6.8 *0.6 91.2 Wrist Palm     Palm *8.0 *8.2 0.2 5.5 2.9 47.3       Radial Anti Sensory (Base 1st Digit)  35.8C  Wrist 2.2 2.3 0.1 11.9 12.3 3.3 Wrist Base 1st Digit     Ulnar Anti Sensory (5th Digit)  34C  Wrist 3.2 3.2 0.0 17.7 22.4 21.0 Wrist 5th Digit 44 44 0   Motor Left/Right Comparison   Stim Site L Lat (ms) R Lat (ms) L-R Lat (ms) L Amp (mV) R Amp (mV) L-R Amp (%) Site1 Site2 L Vel (m/s) R Vel (m/s) L-R Vel (m/s)  Median Motor (Abd Poll Brev)  36C  Wrist *6.8 *7.9 *1.1 5.5 6.1 9.8 Elbow Wrist *43 *43 0  Elbow 11.2 12.4 1.2 5.1 1.8 64.7       Ulnar Motor (Abd Dig Min)  35.5C  Wrist 3.0 2.8 0.2 7.6 7.7 1.3 B Elbow Wrist 66 58 8  B Elbow 5.9 5.9 0.0 6.2 6.6 6.1 A Elbow B Elbow 64 77 13  A Elbow 7.3 7.2 0.1 3.9 4.5 13.3          Waveforms:

## 2018-07-19 NOTE — Progress Notes (Signed)
[name] - 73 y.o. female MRN [id number]  Date of birth: 08/30/1945  Office Visit Note: Visit Date: 07/16/2018 PCP: Ina Homes, MD Referred by: Ina Homes, MD  Subjective: Chief Complaint  Patient presents with  . Right Shoulder - Pain  . Neck - Pain  . Left Shoulder - Pain  . Left Arm - Pain  . Right Arm - Pain  . Left Hand - Pain  . Right Hand - Pain   HPI: [name] is a 73 y.o. female who comes in today At the request of Eduard Roux for electrodiagnostic study of both upper limbs.  Patient is right-hand dominant and is having left more than right shoulder arm and hand pain with numbness and tingling really in all digits globally.  She does endorse some cervicalgia neck pain.  She has a history of type 2 diabetes.  She does carry a diagnosis of polyneuropathy in the lower limbs.  She has had no specific trauma.  Pain is worse with movement and carrying her purse.  Nothing seems to help with the pain.  She has had prior electrodiagnostic studies many years ago.  Evidently it did show what she refers to as carpal tunnel syndrome and her husband would let her have surgery at the time but we do not have those studies to review.  ROS Otherwise per HPI.  Assessment & Plan: Visit Diagnoses:  1. Paresthesia of skin   2. Type 2 diabetes mellitus with diabetic nephropathy, without long-term current use of insulin (HCC)   3. Other polyneuropathy   4. Neck pain     Plan: Impression: The above electrodiagnostic study is ABNORMAL and reveals evidence of a moderate to severe BILATERAL median nerve entrapment at the wrist (carpal tunnel syndrome) affecting sensory and motor components.   There is no significant electrodiagnostic evidence of any other focal nerve entrapment, brachial plexopathy or cervical radiculopathy.   Recommendations: 1.  Follow-up with referring physician. 2.  Continue current management of symptoms. 3.  Continue use of resting splint at  night-time and as needed during the day. 4.  Suggest surgical evaluation.   Meds & Orders: No orders of the defined types were placed in this encounter.   Orders Placed This Encounter  Procedures  . NCV with EMG (electromyography)    Follow-up: Return for  Eduard Roux, M.D..   Procedures: No procedures performed  EMG & NCV Findings: Evaluation of the left median motor and the right median motor nerves showed prolonged distal onset latency (L6.8, R7.9 ms) and decreased conduction velocity (Elbow-Wrist, L43, R43 m/s).  The left median (across palm) sensory and the right median (across palm) sensory nerves showed prolonged distal peak latency (Wrist, L6.4, R8.6 ms), reduced amplitude (L6.8, R0.6 V), and prolonged distal peak latency (Palm, L8.0, R8.2 ms).  All remaining nerves (as indicated in the following tables) were within normal limits.  Left vs. Right side comparison data for the median motor nerve indicates abnormal L-R latency difference (1.1 ms).  All remaining left vs. right side differences were within normal limits.    All examined muscles (as indicated in the following table) showed no evidence of electrical instability.    Impression: The above electrodiagnostic study is ABNORMAL and reveals evidence of a moderate to severe BILATERAL median nerve entrapment at the wrist (carpal tunnel syndrome) affecting sensory and motor components.   There is no significant electrodiagnostic evidence of any other focal nerve entrapment, brachial plexopathy or cervical radiculopathy.   Recommendations: 1.  Follow-up with referring physician. 2.  Continue current management of symptoms. 3.  Continue use of resting splint at night-time and as needed during the day. 4.  Suggest surgical evaluation.  ___________________________ Laurence Spates FAAPMR Board Certified, American Board of Physical Medicine and Rehabilitation    Nerve Conduction Studies Anti Sensory Summary Table   Stim Site NR  Peak (ms) Norm Peak (ms) P-T Amp (V) Norm P-T Amp Site1 Site2 Delta-P (ms) Dist (cm) Vel (m/s) Norm Vel (m/s)  Left Median Acr Palm Anti Sensory (2nd Digit)  33.7C  Wrist    *6.4 <3.6 *6.8 >10 Wrist Palm 1.6 0.0    Palm    *8.0 <2.0 5.5         Right Median Acr Palm Anti Sensory (2nd Digit)  33.1C  Wrist    *8.6 <3.6 *0.6 >10 Wrist Palm 0.4 0.0    Palm    *8.2 <2.0 2.9         Site 3    4.0  2.4         Left Radial Anti Sensory (Base 1st Digit)  35.8C  Wrist    2.2 <3.1 11.9  Wrist Base 1st Digit 2.2 0.0    Right Radial Anti Sensory (Base 1st Digit)  32.2C  Wrist    2.3 <3.1 12.3  Wrist Base 1st Digit 2.3 0.0    Left Ulnar Anti Sensory (5th Digit)  34C  Wrist    3.2 <3.7 17.7 >15.0 Wrist 5th Digit 3.2 14.0 44 >38  Right Ulnar Anti Sensory (5th Digit)  33C  Wrist    3.2 <3.7 22.4 >15.0 Wrist 5th Digit 3.2 14.0 44 >38   Motor Summary Table   Stim Site NR Onset (ms) Norm Onset (ms) O-P Amp (mV) Norm O-P Amp Site1 Site2 Delta-0 (ms) Dist (cm) Vel (m/s) Norm Vel (m/s)  Left Median Motor (Abd Poll Brev)  36C  Wrist    *6.8 <4.2 5.5 >5 Elbow Wrist 4.4 19.0 *43 >50  Elbow    11.2  5.1         Right Median Motor (Abd Poll Brev)  32C  Wrist    *7.9 <4.2 6.1 >5 Elbow Wrist 4.5 19.5 *43 >50  Elbow    12.4  1.8         Left Ulnar Motor (Abd Dig Min)  35.5C  Wrist    3.0 <4.2 7.6 >3 B Elbow Wrist 2.9 19.0 66 >53  B Elbow    5.9  6.2  A Elbow B Elbow 1.4 9.0 64 >53  A Elbow    7.3  3.9         Right Ulnar Motor (Abd Dig Min)  32C  Wrist    2.8 <4.2 7.7 >3 B Elbow Wrist 3.1 18.0 58 >53  B Elbow    5.9  6.6  A Elbow B Elbow 1.3 10.0 77 >53  A Elbow    7.2  4.5          EMG   Side Muscle Nerve Root Ins Act Fibs Psw Amp Dur Poly Recrt Int Fraser Din Comment  Left Abd Poll Brev Median C8-T1 Nml Nml Nml Nml Nml 0 Nml Nml   Left 1stDorInt Ulnar C8-T1 Nml Nml Nml Nml Nml 0 Nml Nml   Left PronatorTeres Median C6-7 Nml Nml Nml Nml Nml 0 Nml Nml   Left Biceps Musculocut C5-6 Nml Nml Nml Nml Nml 0  Nml Nml   Left Deltoid Axillary C5-6 Nml Nml Nml  Nml Nml 0 Nml Nml     Nerve Conduction Studies Anti Sensory Left/Right Comparison   Stim Site L Lat (ms) R Lat (ms) L-R Lat (ms) L Amp (V) R Amp (V) L-R Amp (%) Site1 Site2 L Vel (m/s) R Vel (m/s) L-R Vel (m/s)  Median Acr Palm Anti Sensory (2nd Digit)  33.7C  Wrist *6.4 *8.6 2.2 *6.8 *0.6 91.2 Wrist Palm     Palm *8.0 *8.2 0.2 5.5 2.9 47.3       Radial Anti Sensory (Base 1st Digit)  35.8C  Wrist 2.2 2.3 0.1 11.9 12.3 3.3 Wrist Base 1st Digit     Ulnar Anti Sensory (5th Digit)  34C  Wrist 3.2 3.2 0.0 17.7 22.4 21.0 Wrist 5th Digit 44 44 0   Motor Left/Right Comparison   Stim Site L Lat (ms) R Lat (ms) L-R Lat (ms) L Amp (mV) R Amp (mV) L-R Amp (%) Site1 Site2 L Vel (m/s) R Vel (m/s) L-R Vel (m/s)  Median Motor (Abd Poll Brev)  36C  Wrist *6.8 *7.9 *1.1 5.5 6.1 9.8 Elbow Wrist *43 *43 0  Elbow 11.2 12.4 1.2 5.1 1.8 64.7       Ulnar Motor (Abd Dig Min)  35.5C  Wrist 3.0 2.8 0.2 7.6 7.7 1.3 B Elbow Wrist 66 58 8  B Elbow 5.9 5.9 0.0 6.2 6.6 6.1 A Elbow B Elbow 64 77 13  A Elbow 7.3 7.2 0.1 3.9 4.5 13.3          Waveforms:                     Clinical History: No specialty comments available.   She reports that she has never smoked. She has never used smokeless tobacco.  Recent Labs    01/14/18 1643 04/19/18 1436  HGBA1C 6.9* 7.2*    Objective:  VS:  HT:    WT:   BMI:     BP:   HR: bpm  TEMP: ( )  RESP:  Physical Exam Musculoskeletal:        General: No swelling, tenderness or deformity.     Comments: Inspection reveals flattening of the bilateral APB but no atrophy of the bilateral APB or FDI or hand intrinsics. There is no swelling, color changes, allodynia or dystrophic changes. There is 5 out of 5 strength in the bilateral wrist extension, finger abduction and long finger flexion.  There is subjective decreased sensation in the median nerve distribution bilaterally. There is a equivocally positive  Phalen's test bilaterally. There is a negative Hoffmann's test bilaterally.  Skin:    General: Skin is warm and dry.     Findings: No erythema or rash.  Neurological:     General: No focal deficit present.     Mental Status: She is alert and oriented to person, place, and time.     Motor: No weakness or abnormal muscle tone.     Coordination: Coordination normal.  Psychiatric:        Mood and Affect: Mood normal.        Behavior: Behavior normal.     Ortho Exam Imaging: No results found.  Past Medical/Family/Surgical/Social History: Medications & Allergies reviewed per EMR, new medications updated. Patient Active Problem List   Diagnosis Date Noted  . Carpal tunnel syndrome, bilateral 06/29/2018  . Neck pain 06/29/2018  . Left wrist pain 06/22/2018  . Nasal congestion 06/22/2018  . Diarrhea 01/28/2018  . Depressed mood 01/21/2018  . Need for immunization  against influenza 01/17/2018  . Pancreatitis 07/01/2016  . Constipation 01/15/2016  . Allergic urticaria 12/21/2015  . Poor appetite 11/27/2015  . Ganglion cyst 07/12/2015  . Piriformis syndrome of right side 03/02/2014  . Osteopenia 12/13/2013  . Vaginal dryness 12/13/2013  . Abdominal pain 11/02/2013  . Peripheral neuropathy 11/02/2013  . Bilateral hand numbness 09/02/2013  . Health care maintenance 11/17/2012  . Infection of prosthetic knee joint ([place]) 09/03/2012  . Arthritis of knee, right 08/16/2012  . Breast cancer, right breast ([place]) 09/25/2010  . COLONIC POLYPS, HX OF 07/19/2007  . OBESITY NOS 07/09/2006  . Type 2 diabetes mellitus with renal manifestations ([facility]) 03/10/2006  . Hyperlipidemia 03/10/2006  . Hypertension associated with diabetes ([place]) 03/10/2006  . ALLERGIC RHINITIS 03/10/2006  . Fatty liver 03/10/2006   Past Medical History:  Diagnosis Date  . Allergy   . Anemia   . Breast CA ([facility])    (Rt) breast ca dx 4/12---  S/P RADICAL MASTECTOMY AND CHEMORADIATION  . Diabetes mellitus ORAL MED  .  Fatty liver disease, nonalcoholic   . History of hyperkalemia   . Hx antineoplastic chemo  10/12 - 06/24/11   PACLITAX EL COMPLETED 06/24/11  . Hyperlipidemia   . Hypertension   . Mild nonproliferative diabetic retinopathy of right eye ([place]) 03/19/11   Dr. Joseph Art  . Numbness and tingling    Hx: of in fingers and toes since chemotherapy  . OA (osteoarthritis) of knee    RIGHT LEG  . Obesity   . Renal lesion    Family History  Problem Relation Age of Onset  . Stroke Father   . Heart disease Sister   . Cancer Sister        breast  . Heart disease Brother   . Cancer Sister        COLON CANCER   Past Surgical History:  Procedure Laterality Date  . COLONOSCOPY W/ POLYPECTOMY     Hx; of  . I&D KNEE WITH POLY EXCHANGE Right 09/03/2012   Procedure: IRRIGATION AND DEBRIDEMENT RIGHT KNEE WITH POLY EXCHANGE;  Surgeon: Kerin Salen, MD;  Location: [facility];  Service: Orthopedics;  Laterality: Right;  . knee arthroscopy  10-28-1999   RIGHT  . Laparoscopy with laparoscopic right salpingo oophorectomy and lysis of pelvic and abdominal adhesions  March 2013   Dr. Nori Riis   . MASTECTOMY MODIFIED RADICAL  12-04-2010   W/ LEFT PAC PLACEMENT (RIGHT BREAST W/ AXILLARY CONTENTS/ NODE BX'S  . PORT-A-CATH REMOVAL  09/24/2011   Procedure: REMOVAL PORT-A-CATH;  Surgeon: Joyice Faster. Cornett, MD;  Location[facility];  Service: General;  Laterality: N/A;  . PORTACATH PLACEMENT     REPLACED PAC DUE TO MALFUNCTION (LEFT)  . TOTAL KNEE ARTHROPLASTY Right 08/16/2012   Procedure: TOTAL KNEE ARTHROPLASTY;  Surgeon: Kerin Salen, MD;  Location: [facility];  Service: Orthopedics;  Laterality: Right;  . TRANSTHORACIC ECHOCARDIOGRAM  10-29-2010   NORMAL LVF/ EF 55-60%/ MILD MV REGURG  . TUBAL LIGATION  YRS AGO  . VAGINAL HYSTERECTOMY  AGE 43   W/ LSO   Social History   Occupational History  . Not on file  Tobacco Use  . Smoking status: Never Smoker  . Smokeless tobacco: Never Used  Substance and Sexual Activity   . Alcohol use: No    Alcohol/week: 0.0 standard drinks  . Drug use: No    Types: Flunitrazepam  . Sexual activity: Not on file    Comment: MENARCHE AGE 42, G4, P4, PARITY  AGE 24, HRT 20+ YEARS

## 2018-07-20 ENCOUNTER — Encounter (INDEPENDENT_AMBULATORY_CARE_PROVIDER_SITE_OTHER): Payer: Self-pay | Admitting: Orthopaedic Surgery

## 2018-07-20 ENCOUNTER — Other Ambulatory Visit: Payer: Self-pay

## 2018-07-20 ENCOUNTER — Ambulatory Visit (INDEPENDENT_AMBULATORY_CARE_PROVIDER_SITE_OTHER): Payer: Medicare Other | Admitting: Orthopaedic Surgery

## 2018-07-20 VITALS — Ht 59.0 in | Wt 153.0 lb

## 2018-07-20 DIAGNOSIS — G5603 Carpal tunnel syndrome, bilateral upper limbs: Secondary | ICD-10-CM

## 2018-07-20 DIAGNOSIS — E119 Type 2 diabetes mellitus without complications: Secondary | ICD-10-CM | POA: Diagnosis not present

## 2018-07-20 NOTE — Progress Notes (Signed)
Office Visit Note   Patient: [name]           Date of Birth: 09-Mar-1946           MRN: [id number] Visit Date: 07/20/2018              Requested by: Ina Homes, MD [address] [place], [place] [address] PCP: Ina Homes, MD   Assessment & Plan: Visit Diagnoses:  1. Bilateral carpal tunnel syndrome     Plan: Impression is bilateral carpal tunnel syndrome moderate to severe in nature left worse than right.  Patient is failed conservative treatment would like to proceed with definitive treatment of carpal tunnel release.  We will proceed with the left, more symptomatic side first.  Risks, benefits and possible occasions reviewed.  Rehab recovery time discussed.  All questions were answered.  She is a diabetic so we will obtain a new hemoglobin A1c today.  We will call her with the results.  Should her hemoglobin A1c exceed 8.0, we will postpone the surgery.  Follow-Up Instructions: Return for two weeks post-op.   Orders:  Orders Placed This Encounter  Procedures   HgB A1c   No orders of the defined types were placed in this encounter.     Procedures: No procedures performed   Clinical Data: No additional findings.   Subjective: Chief Complaint  Patient presents with   Right Wrist - Follow-up   Left Wrist - Follow-up   Neck - Follow-up    HPI patient is a pleasant right-hand-dominant female who presents our clinic today discuss nerve conduction studies bilateral upper extremities.  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  She notes that she was tested for carpal tunnel syndrome many years ago but was unable to have the surgery.  She has since suffered from pain and numbness tingling and burning to both hands.  She does not get relief from bracing.  Recent nerve conduction studies show moderate to severe median nerve compression bilateral upper extremities.  Review of Systems as detailed in HPI.  All others reviewed and  are negative.   Objective: Vital Signs: Ht 4\' 11"  (1.499 m)    Wt 153 lb (69.4 kg)    BMI 30.90 kg/m   Physical Exam well-developed and well-nourished female in no acute distress.  Alert and oriented x3.  Ortho Exam stable exam of both hands  Specialty Comments:  No specialty comments available.  Imaging: No new imaging   PMFS History: Patient Active Problem List   Diagnosis Date Noted   Bilateral carpal tunnel syndrome 06/29/2018   Neck pain 06/29/2018   Left wrist pain 06/22/2018   Nasal congestion 06/22/2018   Diarrhea 01/28/2018   Depressed mood 01/21/2018   Need for immunization against influenza 01/17/2018   Pancreatitis 07/01/2016   Constipation 01/15/2016   Allergic urticaria 12/21/2015   Poor appetite 11/27/2015   Ganglion cyst 07/12/2015   Piriformis syndrome of right side 03/02/2014   Osteopenia 12/13/2013   Vaginal dryness 12/13/2013   Abdominal pain 11/02/2013   Peripheral neuropathy 11/02/2013   Bilateral hand numbness 09/02/2013   Health care maintenance 11/17/2012   Infection of prosthetic knee joint ([place]) 09/03/2012   Arthritis of knee, right 08/16/2012   Breast cancer, right breast ([place]) 09/25/2010   COLONIC POLYPS, HX OF 07/19/2007   OBESITY NOS 07/09/2006   Type 2 diabetes mellitus with renal manifestations ([place]) 03/10/2006   Hyperlipidemia 03/10/2006   Hypertension associated with diabetes ([place]) 03/10/2006  ALLERGIC RHINITIS 03/10/2006   Fatty liver 03/10/2006   Past Medical History:  Diagnosis Date   Allergy    Anemia    Breast CA ([place])    (Rt) breast ca dx 4/12---  S/P RADICAL MASTECTOMY AND CHEMORADIATION   Diabetes mellitus ORAL MED   Fatty liver disease, nonalcoholic    History of hyperkalemia    Hx antineoplastic chemo  10/12 - 06/24/11   PACLITAX EL COMPLETED 06/24/11   Hyperlipidemia    Hypertension    Mild nonproliferative diabetic retinopathy of right eye ([place]) 03/19/11   Dr. Benjaman Pott  [place]   Numbness and tingling    Hx: of in fingers and toes since chemotherapy   OA (osteoarthritis) of knee    RIGHT LEG   Obesity    Renal lesion     Family History  Problem Relation Age of Onset   Stroke Father    Heart disease Sister    Cancer Sister        breast   Heart disease Brother    Cancer Sister        COLON CANCER    Past Surgical History:  Procedure Laterality Date   COLONOSCOPY W/ POLYPECTOMY     Hx; of   I&D KNEE WITH POLY EXCHANGE Right 09/03/2012   Procedure: IRRIGATION AND DEBRIDEMENT RIGHT KNEE WITH POLY EXCHANGE;  Surgeon: Kerin Salen, MD;  Location: [facility];  Service: Orthopedics;  Laterality: Right;   knee arthroscopy  10-28-1999   RIGHT   Laparoscopy with laparoscopic right salpingo oophorectomy and lysis of pelvic and abdominal adhesions  March 2013   Dr. Nori Riis    MASTECTOMY MODIFIED RADICAL  12-04-2010   W/ LEFT PAC PLACEMENT (RIGHT BREAST W/ AXILLARY CONTENTS/ NODE BX'S   PORT-A-CATH REMOVAL  09/24/2011   Procedure: REMOVAL PORT-A-CATH;  Surgeon: Joyice Faster. Cornett, MD;  [facility];  Service: General;  Laterality: N/A;   PORTACATH PLACEMENT     REPLACED PAC DUE TO MALFUNCTION (LEFT)   TOTAL KNEE ARTHROPLASTY Right 08/16/2012   Procedure: TOTAL KNEE ARTHROPLASTY;  Surgeon: Kerin Salen, MD;  Location: [facility];  Service: Orthopedics;  Laterality: Right;   TRANSTHORACIC ECHOCARDIOGRAM  10-29-2010   NORMAL LVF/ EF 55-60%/ MILD MV REGURG   TUBAL LIGATION  YRS AGO   VAGINAL HYSTERECTOMY  AGE 28   W/ LSO   Social History   Occupational History   Not on file  Tobacco Use   Smoking status: Never Smoker   Smokeless tobacco: Never Used  Substance and Sexual Activity   Alcohol use: No    Alcohol/week: 0.0 standard drinks   Drug use: No    Types: Flunitrazepam   Sexual activity: Not on file    Comment: MENARCHE AGE 68, G4, P4, PARITY AGE 42, HRT 20+ YEARS

## 2018-07-22 LAB — HEMOGLOBIN A1C

## 2018-07-22 LAB — TIQ-NTM

## 2018-07-22 NOTE — Progress Notes (Signed)
Do you know what happened with this?  This was the older lady I saw on [facility] a few days ago with bilateral carpal tunnel syndrome.  I am not positive who at the office drew the labs

## 2018-07-23 ENCOUNTER — Telehealth (INDEPENDENT_AMBULATORY_CARE_PROVIDER_SITE_OTHER): Payer: Self-pay

## 2018-07-23 NOTE — Telephone Encounter (Signed)
Not sure what happened with labs. I tried calling patient to see if she can come in to have these labs drawn again or to see if she had a PCP appt coming up. No answer LMOM to return our call.    Candice Camp, RMA        Not sure. But looks like maybe not in correct tube? Patient was going to pcp soon. Can you call her and see if she can have them draw A1C?

## 2018-07-23 NOTE — Progress Notes (Signed)
Not sure.  But looks like maybe not in correct tube?  Patient was going to pcp soon.  Can you call her and see if she can have them draw A1C?

## 2018-07-26 NOTE — Telephone Encounter (Signed)
Patient will come in tomorrow to have blood work redrawn.

## 2018-07-27 ENCOUNTER — Other Ambulatory Visit: Payer: Self-pay

## 2018-07-27 ENCOUNTER — Encounter (INDEPENDENT_AMBULATORY_CARE_PROVIDER_SITE_OTHER): Payer: Self-pay

## 2018-07-27 ENCOUNTER — Ambulatory Visit (INDEPENDENT_AMBULATORY_CARE_PROVIDER_SITE_OTHER): Payer: Medicare Other

## 2018-07-27 DIAGNOSIS — Z01812 Encounter for preprocedural laboratory examination: Secondary | ICD-10-CM | POA: Diagnosis not present

## 2018-07-27 DIAGNOSIS — G5603 Carpal tunnel syndrome, bilateral upper limbs: Secondary | ICD-10-CM

## 2018-07-27 DIAGNOSIS — E1129 Type 2 diabetes mellitus with other diabetic kidney complication: Secondary | ICD-10-CM | POA: Diagnosis not present

## 2018-07-27 DIAGNOSIS — E785 Hyperlipidemia, unspecified: Secondary | ICD-10-CM

## 2018-07-27 MED ORDER — ATORVASTATIN CALCIUM 40 MG PO TABS: 40.0000 mg | ORAL_TABLET | Freq: Every day | ORAL | 1 refills | Status: DC

## 2018-07-27 NOTE — Telephone Encounter (Signed)
atorvastatin (LIPITOR) 40 MG tablet, refill request @  [facility] [place], [place] AT [place] [phone] (Phone) [phone] (Fax)

## 2018-07-28 ENCOUNTER — Telehealth (INDEPENDENT_AMBULATORY_CARE_PROVIDER_SITE_OTHER): Payer: Self-pay

## 2018-07-28 LAB — HEMOGLOBIN A1C
Hgb A1c MFr Bld: 12.2 % of total Hgb — ABNORMAL HIGH (ref <5.7)
Mean Plasma Glucose: 303 (calc)
eAG (mmol/L): 16.8 (calc)

## 2018-07-28 LAB — TIQ-NTM

## 2018-07-28 NOTE — Telephone Encounter (Signed)
Called patient no answer. If she calls back just need to advise on  Message below.    Candice Camp, RMA        Will you let her know that her A1C is way too high for surgery (12.2), and that it needs to be under 8.0 for surgery. Since we are not doing elective surgery right now anyway, she can really use this time to work on getting it lower so that she can have the surgery in the future

## 2018-07-28 NOTE — Progress Notes (Signed)
Way too high

## 2018-07-28 NOTE — Progress Notes (Signed)
Will you let her know that her A1C is way too high for surgery (12.2), and that it needs to be under 8.0 for surgery.  Since we are not doing elective surgery right now anyway, she can really use this time to work on getting it lower so that she can have the surgery in the future

## 2018-07-30 NOTE — Telephone Encounter (Signed)
I tried calling both numbers listed. The home phone has a mail box that has not been set up yet and the mobile phone states customer unavailable at the time.

## 2018-07-30 NOTE — Telephone Encounter (Signed)
Pt returned call and advised of message below. She questioned how she is to get her BS lower. Advised that she should contact her PCP or endocrinologist they may have recommendations about medication and or diet.

## 2018-08-17 ENCOUNTER — Telehealth: Payer: Self-pay

## 2018-08-17 NOTE — Telephone Encounter (Signed)
Requesting to speak with a nurse about pinch nerve on the neck. Please call pt back

## 2018-08-17 NOTE — Telephone Encounter (Signed)
Returned pt's call - no answer; left message to give us a call back. 

## 2018-08-18 ENCOUNTER — Inpatient Hospital Stay (INDEPENDENT_AMBULATORY_CARE_PROVIDER_SITE_OTHER): Payer: Medicare Other | Admitting: Orthopaedic Surgery

## 2018-08-18 NOTE — Telephone Encounter (Signed)
Called pt - no answer; left message to give us a call back. 

## 2018-08-18 NOTE — Telephone Encounter (Signed)
Please call pt back.

## 2018-08-19 ENCOUNTER — Other Ambulatory Visit: Payer: Self-pay

## 2018-08-19 ENCOUNTER — Ambulatory Visit (INDEPENDENT_AMBULATORY_CARE_PROVIDER_SITE_OTHER): Payer: Medicare Other

## 2018-08-19 ENCOUNTER — Encounter (HOSPITAL_COMMUNITY): Payer: Self-pay

## 2018-08-19 ENCOUNTER — Ambulatory Visit (HOSPITAL_COMMUNITY)
Admission: EM | Admit: 2018-08-19 | Discharge: 2018-08-19 | Disposition: A | Discharge status: Home / Self Care | Payer: Medicare Other | Attending: Internal Medicine | Admitting: Internal Medicine

## 2018-08-19 DIAGNOSIS — M19012 Primary osteoarthritis, left shoulder: Secondary | ICD-10-CM | POA: Diagnosis not present

## 2018-08-19 DIAGNOSIS — S4992XA Unspecified injury of left shoulder and upper arm, initial encounter: Secondary | ICD-10-CM | POA: Diagnosis not present

## 2018-08-19 MED ORDER — TRAMADOL HCL 50 MG PO TABS: 50.0000 mg | ORAL_TABLET | Freq: Two times a day (BID) | ORAL | 0 refills | Status: DC | PRN

## 2018-08-19 NOTE — ED Triage Notes (Signed)
Was lifting a container earlier when heard a pop in left shoulder, pt is unable to lift  arm, swelling noted to hand , pain 7/10

## 2018-08-19 NOTE — Discharge Instructions (Signed)
Shoulder x-rays did not show fracture or dislocation Continue conservative management of rest, ice, elevation and gentle stretches including pendulum swing and wall crawls Continue with tylenol as needed for pain Follow up with orthopedist for further evaluation and management Return or go to the ER if you have any new or worsening symptoms (fever, chills, chest pain, worsening shoulder pain, skin changes, swelling, etc...)

## 2018-08-19 NOTE — Telephone Encounter (Signed)
Pls call patient regarding pinch nerve; pt contact [phone]

## 2018-08-19 NOTE — Telephone Encounter (Signed)
Called pt -c/o pinch nerve (neck both sides); left arm pain-radiates to wrist. Stated her surgery has been canceled d/t coronavirus. Also stated she has been eating very well so her BS have been elevated, from 191 - 399; but she's working on it. She's agreeable for tele health -  appt scheduled for tomorrow in [facility].

## 2018-08-19 NOTE — ED Provider Notes (Signed)
[facility]   [id number] 08/19/18 Arrival Time: 1913  CC: left shoulder pain  SUBJECTIVE: History from: patient. [name] is a 73 y.o. female hx significant for anemia, breast cancer, DM, nonalcoholic fatty liver, HLD, HTN, OA and obesity, complains of left shoulder pain that began 3 hours ago.  Symptoms began after lifting a tote full of papers when she felt a pop in her left shoulder.  Pain is diffuse about the shoulder.  Pain is 7/10.  Has tried OTC tylenol with relief.  Symptoms are made worse with shoulder ROM.  Denies similar symptoms in the past.  Complains of swelling in left hand.  Denies fever, chills, erythema, ecchymosis, effusion, weakness, numbness and tingling.      Also mentions left wrist with CTS.  Is followed by orthopedist for CTS. Was scheduled for surgery, but was cancelled due to [place].    ROS: As per HPI.  Past Medical History:  Diagnosis Date  . Allergy   . Anemia   . Breast CA ([place])    (Rt) breast ca dx 4/12---  S/P RADICAL MASTECTOMY AND CHEMORADIATION  . Diabetes mellitus ORAL MED  . Fatty liver disease, nonalcoholic   . History of hyperkalemia   . Hx antineoplastic chemo  10/12 - 06/24/11   PACLITAX EL COMPLETED 06/24/11  . Hyperlipidemia   . Hypertension   . Mild nonproliferative diabetic retinopathy of right eye ([place] [facility]) 03/19/11   Dr. Joseph Art  . Numbness and tingling    Hx: of in fingers and toes since chemotherapy  . OA (osteoarthritis) of knee    RIGHT LEG  . Obesity   . Renal lesion    Past Surgical History:  Procedure Laterality Date  . COLONOSCOPY W/ POLYPECTOMY     Hx; of  . I&D KNEE WITH POLY EXCHANGE Right 09/03/2012   Procedure: IRRIGATION AND DEBRIDEMENT RIGHT KNEE WITH POLY EXCHANGE;  Surgeon: Kerin Salen, MD;  Location: [facility];  Service: Orthopedics;  Laterality: Right;  . knee arthroscopy  10-28-1999   RIGHT  . Laparoscopy with laparoscopic right salpingo oophorectomy and lysis of pelvic and abdominal  adhesions  March 2013   Dr. Nori Riis   . MASTECTOMY MODIFIED RADICAL  12-04-2010   W/ LEFT PAC PLACEMENT (RIGHT BREAST W/ AXILLARY CONTENTS/ NODE BX'S  . PORT-A-CATH REMOVAL  09/24/2011   Procedure: REMOVAL PORT-A-CATH;  Surgeon: Joyice Faster. Cornett, MD;  [facility];  Service: General;  Laterality: N/A;  . PORTACATH PLACEMENT     REPLACED PAC DUE TO MALFUNCTION (LEFT)  . TOTAL KNEE ARTHROPLASTY Right 08/16/2012   Procedure: TOTAL KNEE ARTHROPLASTY;  Surgeon: Kerin Salen, MD;  Location: [facility];  Service: Orthopedics;  Laterality: Right;  . TRANSTHORACIC ECHOCARDIOGRAM  10-29-2010   NORMAL LVF/ EF 55-60%/ MILD MV REGURG  . TUBAL LIGATION  YRS AGO  . VAGINAL HYSTERECTOMY  AGE 65   W/ LSO   Allergies  Allergen Reactions  . Hctz [Hydrochlorothiazide] Other (See Comments)    Pancreatitis  . Metformin Other (See Comments)    REACTION, diarrhea: 850 mg - diarrhea but tolerates 500 mg daily   No current facility-administered medications on file prior to encounter.    Current Outpatient Medications on File Prior to Encounter  Medication Sig Dispense Refill  . atorvastatin (LIPITOR) 40 MG tablet Take 1 tablet (40 mg total) by mouth at bedtime. 90 tablet 1  . chlorthalidone (HYGROTON) 25 MG tablet TAKE 1 TABLET BY MOUTH DAILY 90 tablet 1  . empagliflozin (  JARDIANCE) 10 MG TABS tablet Take 10 mg by mouth daily. 90 tablet 1  . fluticasone (FLONASE) 50 MCG/ACT nasal spray Place 2 sprays into both nostrils daily. 16 g 0  . lisinopril (PRINIVIL,ZESTRIL) 20 MG tablet TAKE 1 TABLET BY MOUTH DAILY 90 tablet 1  . Polyvinyl Alcohol-Povidone (REFRESH OP) Place 2 drops into both eyes daily as needed (for dry eyes).      Social History   Socioeconomic History  . Marital status: Married    Spouse name: Not on file  . Number of children: Not on file  . Years of education: 83  . Highest education level: Not on file  Occupational History  . Not on file  Social Needs  . Financial resource strain: Not  on file  . Food insecurity:    Worry: Not on file    Inability: Not on file  . Transportation needs:    Medical: Not on file    Non-medical: Not on file  Tobacco Use  . Smoking status: Never Smoker  . Smokeless tobacco: Never Used  Substance and Sexual Activity  . Alcohol use: No    Alcohol/week: 0.0 standard drinks  . Drug use: No    Types: Flunitrazepam  . Sexual activity: Not on file    Comment: MENARCHE AGE 89, G4, P4, PARITY AGE 35, HRT 20+ YEARS    Lifestyle  . Physical activity:    Days per week: Not on file    Minutes per session: Not on file  . Stress: Not on file  Relationships  . Social connections:    Talks on phone: Not on file    Gets together: Not on file    Attends religious service: Not on file    Active member of club or organization: Not on file    Attends meetings of clubs or organizations: Not on file    Relationship status: Not on file  . Intimate partner violence:    Fear of current or ex partner: Not on file    Emotionally abused: Not on file    Physically abused: Not on file    Forced sexual activity: Not on file  Other Topics Concern  . 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  Patient needs to submit further paperwork to complete- Bonna Gains 08/14/09.   Financial assistance application completed.  Patient does not qualify for [place] 09/12/09.            Family History  Problem Relation Age of Onset  . Stroke Father   . Heart disease Sister   . Cancer Sister        breast  . Heart disease Brother   . Cancer Sister        COLON CANCER    OBJECTIVE:  Vitals:   08/19/18 1944  BP: 113/62  Pulse: 72  Resp: (!) 22  Temp: 98.1 F (36.7 C)  SpO2: 99%    General appearance: Alert; in no acute distress.  Head: NCAT Lungs: normal respiratory  effort CV: radial pulse 2+; cap refill < 2 seconds Musculoskeletal: Left shoulder Inspection: Skin warm, dry, clear and intact without obvious erythema, effusion, or ecchymosis.  Palpation: Diffusely mild TTP over anterior and lateral shoulder; mildly TTP over lateral forearm musculature, NTTP over medial or  lateral epicondyle or olecranon process ROM: LROM Strength: deferred Skin: warm and dry Neurologic: Ambulates without difficulty; Sensation intact about the upper extremities Psychological: alert and cooperative; normal mood and affect  DIAGNOSTIC STUDIES:  Dg Shoulder Left  Result Date: 08/19/2018 CLINICAL DATA:  Left shoulder pain following heavy lifting, initial encounter EXAM: LEFT SHOULDER - 2+ VIEW COMPARISON:  03/07/2004 FINDINGS: Degenerative changes of the acromioclavicular joint and glenohumeral articulation are seen. These have progressed in the interval from the prior exam. No acute fracture or dislocation is noted. No soft tissue changes are seen. IMPRESSION: Degenerative change without acute abnormality. Electronically Signed   By: Inez Catalina M.D.   On: 08/19/2018 20:41     ASSESSMENT & PLAN:  1. Injury of left shoulder, initial encounter     Meds ordered this encounter  Medications  . traMADol (ULTRAM) 50 MG tablet    Sig: Take 1 tablet (50 mg total) by mouth every 12 (twelve) hours as needed for severe pain.    Dispense:  10 tablet    Refill:  0    Order Specific Question:   Supervising Provider    Answer:   Raylene Everts [[id number]]   Shoulder x-rays did not show fracture or dislocation Continue conservative management of rest, ice, elevation and gentle stretches including pendulum swing and wall crawls Continue with tylenol as needed for pain Follow up with orthopedist for further evaluation and management Return or go to the ER if you have any new or worsening symptoms (fever, chills, chest pain, worsening shoulder pain, skin changes, swelling, etc...)    Tramadol prescription given for severe break-through pain.    Reviewed expectations re: course of current medical issues. Questions answered. Outlined signs and symptoms indicating need for more acute intervention. Patient verbalized understanding. After Visit Summary given.    Lestine Box, PA-C 08/19/18 2054

## 2018-08-20 ENCOUNTER — Ambulatory Visit (INDEPENDENT_AMBULATORY_CARE_PROVIDER_SITE_OTHER): Payer: Medicare Other | Admitting: Internal Medicine

## 2018-08-20 DIAGNOSIS — G542 Cervical root disorders, not elsewhere classified: Secondary | ICD-10-CM

## 2018-08-20 DIAGNOSIS — E1121 Type 2 diabetes mellitus with diabetic nephropathy: Secondary | ICD-10-CM | POA: Diagnosis not present

## 2018-08-20 MED ORDER — GLIPIZIDE ER 5 MG PO TB24: 5.0000 mg | ORAL_TABLET | Freq: Every day | ORAL | 1 refills | Status: DC

## 2018-08-20 MED ORDER — METFORMIN HCL ER 500 MG PO TB24: 500.0000 mg | ORAL_TABLET | Freq: Every day | ORAL | 1 refills | Status: DC

## 2018-08-20 MED ORDER — GABAPENTIN 300 MG PO CAPS: 300.0000 mg | ORAL_CAPSULE | Freq: Every day | ORAL | 1 refills | Status: DC

## 2018-08-20 NOTE — Progress Notes (Signed)
Medicine attending: Medical history, presenting problems, , and medications, reviewed with resident physician Dr Ina Homes on the day of the patient telephone consultation and I concur with his evaluation and management plan. Resume gabapentin; Rx hard cervical collar pending C-spine surgery. Resume prior diabetes meds in view of HbA1c 12.2%

## 2018-08-20 NOTE — Assessment & Plan Note (Signed)
HPI:  Patient previously well controlled with an A1c of approximately seven on Empagliflozin 10 mg once daily. He was preparing to have surgery in orthopedics checked her A1c and it was found to be greater than 12. Her surgery was subsequently canceled. She has been checking her sugars 1 to 2 times daily and notes that there occasionally in 3-400s. She has been drinking more soda over the past couple months and thinks that may be contributing.  A/P: - Continue Empagliflozin 10 mg once daily - START metformin 500 mg QD and Glipizide 5 mg QD - She will work on diet and exercise

## 2018-08-20 NOTE — Assessment & Plan Note (Addendum)
HPI:  Patient has been following with orthopedic surgery for her cervical neuropathy. It is worse on her right more than her left. She was scheduled to have surgery however it was canceled to the [place] outbreak and her uncontrolled diabetes. She states that the pain is very severe and she wants to get the surgery as soon as possible. She has been on gabapentin in the past and the significantly helped.  A/P: - START gabapentin 300 mg QHS. Will increase as need for pain

## 2018-08-20 NOTE — Progress Notes (Signed)
   CC: Neck pain and DM  This is a telephone encounter between [name] and [name] on 08/20/2018 for neck pain and DM. The visit was conducted with the patient located at home and [facility] at [facility]. The patient's identity was confirmed using their DOB and current address. The patient has consented to being evaluated through a telephone encounter and understands the associated risks (an examination cannot be done and the patient may need to come in for an appointment) / benefits (allows the patient to remain at home, decreasing exposure to coronavirus). I personally spent 11 minutes on medical discussion.   HPI:  [name] is a 73 y.o. with PMH as below.   Please see A&P for assessment of the patient's acute and chronic medical conditions.   Past Medical History:  Diagnosis Date  . Allergy   . Anemia   . Breast CA ([place])    (Rt) breast ca dx 4/12---  S/P RADICAL MASTECTOMY AND CHEMORADIATION  . Diabetes mellitus ORAL MED  . Fatty liver disease, nonalcoholic   . History of hyperkalemia   . Hx antineoplastic chemo  10/12 - 06/24/11   PACLITAX EL COMPLETED 06/24/11  . Hyperlipidemia   . Hypertension   . Mild nonproliferative diabetic retinopathy of right eye ([place]) 03/19/11   Dr. Joseph Art  . Numbness and tingling    Hx: of in fingers and toes since chemotherapy  . OA (osteoarthritis) of knee    RIGHT LEG  . Obesity   . Renal lesion    Review of Systems:  Performed and all others negative.  Assessment & Plan:   See Encounters Tab for problem based charting.  Patient discussed with Dr. Beryle Beams

## 2018-09-22 ENCOUNTER — Other Ambulatory Visit: Payer: Self-pay

## 2018-09-22 ENCOUNTER — Ambulatory Visit (INDEPENDENT_AMBULATORY_CARE_PROVIDER_SITE_OTHER): Payer: Medicare Other | Admitting: Internal Medicine

## 2018-09-22 VITALS — BP 104/54 | HR 91 | Temp 98.7°F | Wt 151.9 lb

## 2018-09-22 DIAGNOSIS — E1159 Type 2 diabetes mellitus with other circulatory complications: Secondary | ICD-10-CM

## 2018-09-22 DIAGNOSIS — R3915 Urgency of urination: Secondary | ICD-10-CM | POA: Diagnosis not present

## 2018-09-22 DIAGNOSIS — I10 Essential (primary) hypertension: Secondary | ICD-10-CM

## 2018-09-22 DIAGNOSIS — Z87440 Personal history of urinary (tract) infections: Secondary | ICD-10-CM

## 2018-09-22 DIAGNOSIS — I152 Hypertension secondary to endocrine disorders: Secondary | ICD-10-CM

## 2018-09-22 DIAGNOSIS — Z7984 Long term (current) use of oral hypoglycemic drugs: Secondary | ICD-10-CM

## 2018-09-22 DIAGNOSIS — R300 Dysuria: Secondary | ICD-10-CM | POA: Diagnosis not present

## 2018-09-22 DIAGNOSIS — E1121 Type 2 diabetes mellitus with diabetic nephropathy: Secondary | ICD-10-CM

## 2018-09-22 DIAGNOSIS — R350 Frequency of micturition: Secondary | ICD-10-CM | POA: Diagnosis not present

## 2018-09-22 DIAGNOSIS — Z79899 Other long term (current) drug therapy: Secondary | ICD-10-CM

## 2018-09-22 LAB — POCT URINALYSIS DIPSTICK
Bilirubin, UA: NEGATIVE
Glucose, UA: NEGATIVE
Ketones, UA: NEGATIVE
Nitrite, UA: NEGATIVE
Protein, UA: POSITIVE — AB
Spec Grav, UA: 1.02 (ref 1.010–1.025)
Urobilinogen, UA: 0.2 E.U./dL
pH, UA: 5.5 (ref 5.0–8.0)

## 2018-09-22 LAB — GLUCOSE, CAPILLARY: Glucose-Capillary: 177 mg/dL — ABNORMAL HIGH (ref 70–99)

## 2018-09-22 LAB — POCT GLYCOSYLATED HEMOGLOBIN (HGB A1C): Hemoglobin A1C: 9.2 % — AB (ref 4.0–5.6)

## 2018-09-22 MED ORDER — CEPHALEXIN 500 MG PO CAPS: 500.0000 mg | ORAL_CAPSULE | Freq: Two times a day (BID) | ORAL | 0 refills | Status: DC

## 2018-09-22 NOTE — Patient Instructions (Addendum)
Thank you for visiting clinic today. As we discussed you do have some urinary infection, I am giving you an antibiotic called Keflex 500 mg twice daily for 3 days. It is very important to keep your diabetes under good control to prevent future urinary infections. Your A1c has improved to 9.2 from 12.  We would like to see A1c below 7.  Continue taking her medications and watch your diet. Please bring your glucometer during your next follow-up appointment with PCP.

## 2018-09-22 NOTE — Assessment & Plan Note (Signed)
BP Readings from Last 3 Encounters:  09/22/18 (!) 104/54  08/19/18 113/62  06/21/18 118/68   On initial exam she was mildly hypotensive which improved on recheck.  Denies any dizziness.  -Continue with current management.

## 2018-09-22 NOTE — Assessment & Plan Note (Signed)
Her A1c was again checked today due to UTI, it was 9.2, improved from her previous check of 12.2 in March  2020.  -Continue current management. -Discussed the importance of well-controlled diabetes and bringing A1c below 7 to prevent future UTIs and complications. -We will follow-up with PCP in a month with glucometer.

## 2018-09-22 NOTE — Progress Notes (Signed)
Internal Medicine Clinic Attending  Case discussed with Dr. Amin at the time of the visit.  We reviewed the resident's history and exam and pertinent patient test results.  I agree with the assessment, diagnosis, and plan of care documented in the resident's note.    

## 2018-09-22 NOTE — Progress Notes (Signed)
   CC: Burning micturition with increased frequency and urgency for 4 days  HPI:  [name] is a 73 y.o. with past medical history as listed below came to the clinic with complaint of burning micturition with increased frequency and urgency for 4 days.  She denies any lower abdominal or flank pain.  No fever or chills.  No hematuria.  No vaginal discharge or pruritus. Denies any nausea, vomiting or recent change in her appetite or bowel habits. She has an history of UTI with pansensitive E. coli in 2017.  See assessment and plan for her chronic conditions.  Past Medical History:  Diagnosis Date  . Allergy   . Anemia   . Breast CA ([place])    (Rt) breast ca dx 4/12---  S/P RADICAL MASTECTOMY AND CHEMORADIATION  . Diabetes mellitus ORAL MED  . Fatty liver disease, nonalcoholic   . History of hyperkalemia   . Hx antineoplastic chemo  10/12 - 06/24/11   PACLITAX EL COMPLETED 06/24/11  . Hyperlipidemia   . Hypertension   . Mild nonproliferative diabetic retinopathy of right eye ([place]) 03/19/11   Dr. Joseph Art  . Numbness and tingling    Hx: of in fingers and toes since chemotherapy  . OA (osteoarthritis) of knee    RIGHT LEG  . Obesity   . Renal lesion    Review of Systems: Negative except mentioned in HPI.  Physical Exam:  Vitals:   09/22/18 1026 09/22/18 1048  BP: (!) 143/113 (!) 104/54  Pulse: 91   Temp: 98.7 F (37.1 C)   TempSrc: Oral   SpO2: 100%   Weight: 151 lb 14.4 oz (68.9 kg)    General: Vital signs reviewed.  Patient is well-developed and well-nourished, in no acute distress and cooperative with exam.  Head: Normocephalic and atraumatic. Eyes: EOMI, conjunctivae normal, no scleral icterus.  Cardiovascular: RRR, S1 normal, S2 normal, no murmurs, gallops, or rubs. Pulmonary/Chest: Clear to auscultation bilaterally, no wheezes, rales, or rhonchi. Abdominal: Soft, non-tender, non-distended, BS +, Extremities: No lower extremity edema bilaterally,  pulses  symmetric and intact bilaterally. No cyanosis or clubbing. Skin: Warm, dry and intact.  Psychiatric: Normal mood and affect. speech and behavior is normal. Cognition and memory are normal.  Assessment & Plan:   See Encounters Tab for problem based charting.  Patient discussed with Dr. Angelia Mould.

## 2018-09-22 NOTE — Assessment & Plan Note (Signed)
She is having symptoms of uncomplicated cystitis with POC urine shows large amount of leukocytes with negative nitrites. Has an previous history of UTI with pansensitive E. coli in 2017. Patient has uncontrolled diabetes and is on Jardiance.  -Give her a course of Keflex 500 mg twice daily for 3 days. -Needs better control of her diabetes to prevent further UTIs.

## 2018-09-30 ENCOUNTER — Telehealth: Payer: Self-pay | Admitting: *Deleted

## 2018-09-30 DIAGNOSIS — B379 Candidiasis, unspecified: Secondary | ICD-10-CM

## 2018-09-30 MED ORDER — FLUCONAZOLE 150 MG PO TABS: 150.0000 mg | ORAL_TABLET | Freq: Once | ORAL | 0 refills | Status: AC

## 2018-09-30 NOTE — Telephone Encounter (Signed)
Prescription sent. Thank you

## 2018-09-30 NOTE — Telephone Encounter (Signed)
Patient called in with signs and symptoms of yeast infection. 1x dose of diflucan sent in.

## 2018-09-30 NOTE — Telephone Encounter (Signed)
Pt calls and states she has a yeast disch, would like to be treated, she tried otc and its not working, there are no open appts until tues 6/2. Could you send a script for diflucan 150mg  in #2?

## 2018-10-01 ENCOUNTER — Telehealth: Payer: Self-pay | Admitting: *Deleted

## 2018-10-01 NOTE — Telephone Encounter (Signed)
Her medlist is not showing the diflucan, at the bottom of this note it looks like it was cancelled, could you send it one more time?

## 2018-10-04 MED ORDER — FLUCONAZOLE 150 MG PO TABS: 150.0000 mg | ORAL_TABLET | Freq: Once | ORAL | 0 refills | Status: AC

## 2018-10-18 ENCOUNTER — Other Ambulatory Visit: Payer: Self-pay

## 2018-10-18 NOTE — Patient Outreach (Signed)
[facility]) Care Management  10/18/2018  [name] January 25, 1946 [id number]   Medication Adherence call to [name] Compliant Voice message left with a call back number. [name] is showing past due on Lisinopril 20 mg under [place].   [facility] Management Direct Dial [phone]  Fax [phone] [email or web address]

## 2018-10-19 ENCOUNTER — Other Ambulatory Visit: Payer: Self-pay

## 2018-10-19 NOTE — Patient Outreach (Signed)
[facility]) Care Management  10/19/2018  [name] [birth year] [id number]   Medication Adherence call back from Mrs. [name] patient is due on Lisinopril 20 mg patient explain she is taking 1 tablet daily and has enough for a week pt ask if we can call [facility] an order [facility]. [name] is showing past due under [place].   [facility] Management Direct Dial [phone]  Fax [phone] [email or web address]

## 2018-10-21 ENCOUNTER — Ambulatory Visit (INDEPENDENT_AMBULATORY_CARE_PROVIDER_SITE_OTHER): Payer: Medicare Other | Admitting: Internal Medicine

## 2018-10-21 ENCOUNTER — Other Ambulatory Visit: Payer: Self-pay

## 2018-10-21 VITALS — BP 108/58 | HR 86 | Temp 98.5°F | Ht 59.0 in | Wt 153.3 lb

## 2018-10-21 DIAGNOSIS — E1159 Type 2 diabetes mellitus with other circulatory complications: Secondary | ICD-10-CM | POA: Diagnosis not present

## 2018-10-21 DIAGNOSIS — B373 Candidiasis of vulva and vagina: Secondary | ICD-10-CM | POA: Diagnosis not present

## 2018-10-21 DIAGNOSIS — B3731 Acute candidiasis of vulva and vagina: Secondary | ICD-10-CM

## 2018-10-21 DIAGNOSIS — I10 Essential (primary) hypertension: Secondary | ICD-10-CM

## 2018-10-21 DIAGNOSIS — E1121 Type 2 diabetes mellitus with diabetic nephropathy: Secondary | ICD-10-CM

## 2018-10-21 DIAGNOSIS — I152 Hypertension secondary to endocrine disorders: Secondary | ICD-10-CM

## 2018-10-21 MED ORDER — FLUCONAZOLE 150 MG PO TABS: 150.0000 mg | ORAL_TABLET | ORAL | 0 refills | Status: AC

## 2018-10-21 MED ORDER — FLUCONAZOLE 150 MG PO TABS: 150.0000 mg | ORAL_TABLET | Freq: Every day | ORAL | 0 refills | Status: DC

## 2018-10-21 MED ORDER — METFORMIN HCL ER 500 MG PO TB24: 500.0000 mg | ORAL_TABLET | Freq: Two times a day (BID) | ORAL | 1 refills | Status: DC

## 2018-10-21 NOTE — Assessment & Plan Note (Addendum)
Presented for continued evaluation and management of her DM. Currently uncontrolled with her last A1c of 9.2. Goal A1c for her is 8.0 given her age and comorbidies. She is currently on Metformin 500 mg QD and Glipizide 5 mg QD. She stopped the Empagliflozin 10 mg QD after a recent yeast infection. She is taking the metformin and glipizide as prescribed. No side effects noted. No issues affording her medications. She states that she is continuing to walk at least 1 mile per day and has been altering her diet to avoid high carb foods. She brought her CBG monitor today (readings below).   Low: 94  High: 213 Average: 150-160  Plan will be to increase her Metformin to 500 mg BID and continue Glipizide 5 mg QD. Stop Empagliflozin 10 mg QD for now. She will continue to work on lifestyle changes. Follow-up in 3 months for repeat A1c.

## 2018-10-21 NOTE — Assessment & Plan Note (Addendum)
Patient experience yeast infection after being treated for a UTI. She was given Fluconazole 150 mg once. She states that she continues to have itching, irritation, and scant discharge. She has tried OTC creams without relief.   Will do Fluconazole 150 mg every 72 hours for 3 doses.

## 2018-10-21 NOTE — Progress Notes (Signed)
   CC: F/u DM and HTN  HPI:  Ms.Enda Kampf is a 73 y.o. female with PMHx listed below presenting for hypertension and diabetes mellitus. Please see the A&P for the status of the patient's chronic medical problems.  Past Medical History:  Diagnosis Date  . Allergy   . Anemia   . Breast CA ([place])    (Rt) breast ca dx 4/12---  S/P RADICAL MASTECTOMY AND CHEMORADIATION  . Diabetes mellitus ORAL MED  . Fatty liver disease, nonalcoholic   . History of hyperkalemia   . Hx antineoplastic chemo  10/12 - 06/24/11   PACLITAX EL COMPLETED 06/24/11  . Hyperlipidemia   . Hypertension   . Mild nonproliferative diabetic retinopathy of right eye ([place]) 03/19/11   Dr. Joseph Art  . Numbness and tingling    Hx: of in fingers and toes since chemotherapy  . OA (osteoarthritis) of knee    RIGHT LEG  . Obesity   . Renal lesion    Review of Systems:  Performed and all others negative.  Physical Exam: Vitals:   10/21/18 1333  BP: (!) 108/58  Pulse: 86  Temp: 98.5 F (36.9 C)  TempSrc: Oral  SpO2: 99%  Weight: 153 lb 4.8 oz (69.5 kg)  Height: 4\' 11"  (1.499 m)   General: Well nourished female in no acute distress Pulm: Good air movement with no wheezing or crackles  CV: RRR, no murmurs, no rubs   Assessment & Plan:   See Encounters Tab for problem based charting.  Patient discussed with Dr. Lynnae January

## 2018-10-21 NOTE — Patient Instructions (Addendum)
Thank you for allowing me to provide your care.   Keep up the great work with your diabetes. I would like you to come back in 3 months for a repeat check of your A1c. In the meantime, you can increase your metformin to 1 pill 2 times a day.   I have sent out some fluconazole.

## 2018-10-21 NOTE — Assessment & Plan Note (Signed)
Well controlled HTN. She is currently on chlorthalidone 25 mg QD and Lisinopril 20 mg QD. She is not experiencing any side effects from the medications. Denies orthostatic symptoms. She has not had a BMP done in >12 months.   Plan will be to continue chlorthalidone 25 mg QD and Lisinopril 20 mg QD. We will check a BMP today.

## 2018-10-22 ENCOUNTER — Other Ambulatory Visit: Payer: Self-pay | Admitting: *Deleted

## 2018-10-22 DIAGNOSIS — E1159 Type 2 diabetes mellitus with other circulatory complications: Secondary | ICD-10-CM

## 2018-10-22 DIAGNOSIS — I152 Hypertension secondary to endocrine disorders: Secondary | ICD-10-CM

## 2018-10-22 DIAGNOSIS — E1121 Type 2 diabetes mellitus with diabetic nephropathy: Secondary | ICD-10-CM

## 2018-10-22 LAB — BMP8+ANION GAP
Anion Gap: 14 mmol/L (ref 10.0–18.0)
BUN/Creatinine Ratio: 18 (ref 12–28)
BUN: 16 mg/dL (ref 8–27)
CO2: 28 mmol/L (ref 20–29)
Calcium: 9.6 mg/dL (ref 8.7–10.3)
Chloride: 96 mmol/L (ref 96–106)
Creatinine, Ser: 0.91 mg/dL (ref 0.57–1.00)
GFR calc Af Amer: 73 mL/min/{1.73_m2} (ref >59)
GFR calc non Af Amer: 64 mL/min/{1.73_m2} (ref >59)
Glucose: 144 mg/dL — ABNORMAL HIGH (ref 65–99)
Potassium: 3.5 mmol/L (ref 3.5–5.2)
Sodium: 138 mmol/L (ref 134–144)

## 2018-10-22 MED ORDER — LISINOPRIL 20 MG PO TABS: 20.0000 mg | ORAL_TABLET | Freq: Every day | ORAL | 1 refills | Status: DC

## 2018-10-29 NOTE — Progress Notes (Signed)
All notes sent to me have been cosigned.

## 2018-11-15 ENCOUNTER — Other Ambulatory Visit: Payer: Self-pay | Admitting: *Deleted

## 2018-11-15 DIAGNOSIS — I152 Hypertension secondary to endocrine disorders: Secondary | ICD-10-CM

## 2018-11-15 DIAGNOSIS — E1159 Type 2 diabetes mellitus with other circulatory complications: Secondary | ICD-10-CM

## 2018-11-15 MED ORDER — CHLORTHALIDONE 25 MG PO TABS: 25.0000 mg | ORAL_TABLET | Freq: Every day | ORAL | 1 refills | Status: DC

## 2018-11-26 ENCOUNTER — Other Ambulatory Visit: Payer: Self-pay | Admitting: Oncology

## 2018-11-26 DIAGNOSIS — Z1231 Encounter for screening mammogram for malignant neoplasm of breast: Secondary | ICD-10-CM

## 2018-12-28 DIAGNOSIS — C50911 Malignant neoplasm of unspecified site of right female breast: Secondary | ICD-10-CM | POA: Diagnosis not present

## 2019-01-05 DIAGNOSIS — C50911 Malignant neoplasm of unspecified site of right female breast: Secondary | ICD-10-CM | POA: Diagnosis not present

## 2019-01-13 ENCOUNTER — Other Ambulatory Visit: Payer: Self-pay

## 2019-01-13 ENCOUNTER — Ambulatory Visit
Admission: RE | Admit: 2019-01-13 | Discharge: 2019-01-13 | Disposition: A | Discharge status: Home / Self Care | Payer: Medicare Other | Source: Ambulatory Visit | Attending: Oncology | Admitting: Oncology

## 2019-01-13 DIAGNOSIS — Z1231 Encounter for screening mammogram for malignant neoplasm of breast: Secondary | ICD-10-CM

## 2019-01-25 ENCOUNTER — Other Ambulatory Visit: Payer: Self-pay

## 2019-01-25 ENCOUNTER — Ambulatory Visit (INDEPENDENT_AMBULATORY_CARE_PROVIDER_SITE_OTHER): Payer: Medicare Other | Admitting: Orthopaedic Surgery

## 2019-01-25 ENCOUNTER — Encounter: Payer: Self-pay | Admitting: Orthopaedic Surgery

## 2019-01-25 ENCOUNTER — Ambulatory Visit: Payer: Self-pay

## 2019-01-25 VITALS — Ht 59.0 in | Wt 153.3 lb

## 2019-01-25 DIAGNOSIS — M1712 Unilateral primary osteoarthritis, left knee: Secondary | ICD-10-CM | POA: Diagnosis not present

## 2019-01-25 DIAGNOSIS — G5602 Carpal tunnel syndrome, left upper limb: Secondary | ICD-10-CM | POA: Diagnosis not present

## 2019-01-25 DIAGNOSIS — E1129 Type 2 diabetes mellitus with other diabetic kidney complication: Secondary | ICD-10-CM | POA: Diagnosis not present

## 2019-01-25 MED ORDER — BUPIVACAINE HCL 0.5 % IJ SOLN
2.0000 mL | INTRAMUSCULAR | Status: AC | PRN
  Administered 2019-01-25: 2 mL via INTRA_ARTICULAR

## 2019-01-25 MED ORDER — METHYLPREDNISOLONE ACETATE 40 MG/ML IJ SUSP
40.0000 mg | INTRAMUSCULAR | Status: AC | PRN
  Administered 2019-01-25: 40 mg via INTRA_ARTICULAR

## 2019-01-25 MED ORDER — LIDOCAINE HCL 1 % IJ SOLN
2.0000 mL | INTRAMUSCULAR | Status: AC | PRN
  Administered 2019-01-25: 2 mL

## 2019-01-25 NOTE — Progress Notes (Signed)
Office Visit Note   Patient: [name]           Date of Birth: 1946/05/05           MRN: [id number] Visit Date: 01/25/2019              Requested by: Jillian Homes, MD [address] [place],  [place] [address] PCP: Jillian Homes, MD   Assessment & Plan: Visit Diagnoses:  1. Primary osteoarthritis of left knee   2. Left carpal tunnel syndrome     Plan: Impression is end-stage left knee DJD.  Bilateral carpal tunnel syndrome worse on the left.  Left knee cortisone injection performed today.  Patient will also try Voltaren gel.  For the left carpal tunnel syndrome she would like to proceed with a carpal tunnel release.  We will obtain A1c level today.  We will contact the patient with the results and will schedule surgery if the A1c level is within acceptable levels  Follow-Up Instructions: Return if symptoms worsen or fail to improve.   Orders:  Orders Placed This Encounter  Procedures  . Large Joint Inj  . XR Knee 1-2 Views Left   No orders of the defined types were placed in this encounter.     Procedures: Large Joint Inj: L knee on 01/25/2019 11:09 AM Details: 22 G needle Medications: 2 mL bupivacaine 0.5 %; 2 mL lidocaine 1 %; 40 mg methylPREDNISolone acetate 40 MG/ML Outcome: tolerated well, no immediate complications Patient was prepped and draped in the usual sterile fashion.       Clinical Data: No additional findings.   Subjective: Chief Complaint  Patient presents with  . Left Knee - Pain    [name] is a 73 year old female comes in for evaluation of left knee pain as well as follow-up for bilateral carpal tunnel syndrome.  In terms of her left knee feels reminiscent pain her right knee DJD from years ago before she had replaced.  She would like to try cortisone injection today.  She is hesitant to have the left knee replaced as the right one got infected.  In terms of her carpal tunnel syndrome she would like to proceed with left carpal tunnel  release.  She has had prior nerve conduction studies showing bilateral moderate to severe carpal tunnel.     Review of Systems  Constitutional: Negative.   HENT: Negative.   Eyes: Negative.   Respiratory: Negative.   Cardiovascular: Negative.   Endocrine: Negative.   Musculoskeletal: Negative.   Neurological: Negative.   Hematological: Negative.   Psychiatric/Behavioral: Negative.   All other systems reviewed and are negative.    Objective: Vital Signs: Ht 4\' 11"  (1.499 m)   Wt 153 lb 4.8 oz (69.5 kg)   BMI 30.96 kg/m   Physical Exam Vitals signs and nursing note reviewed.  Constitutional:      Appearance: She is well-developed.  Pulmonary:     Effort: Pulmonary effort is normal.  Skin:    General: Skin is warm.     Capillary Refill: Capillary refill takes less than 2 seconds.  Neurological:     Mental Status: She is alert and oriented to person, place, and time.  Psychiatric:        Behavior: Behavior normal.        Thought Content: Thought content normal.        Judgment: Judgment normal.     Ortho Exam Left knee exam shows no joint effusion.  Collaterals and cruciates  are stable.  1+ patellofemoral crepitus. Bilateral hand exams are unchanged. Specialty Comments:  No specialty comments available.  Imaging: Xr Knee 1-2 Views Left  Result Date: 01/25/2019 Advanced tricompartmental degenerative joint disease    PMFS History: Patient Active Problem List   Diagnosis Date Noted  . Primary osteoarthritis of left knee 01/25/2019  . Left carpal tunnel syndrome 01/25/2019  . Yeast infection of the vagina 10/21/2018  . Dysuria 09/22/2018  . Cervical neuropathy 08/20/2018  . Bilateral carpal tunnel syndrome 06/29/2018  . Neck pain 06/29/2018  . Left wrist pain 06/22/2018  . Depressed mood 01/21/2018  . Need for immunization against influenza 01/17/2018  . Constipation 01/15/2016  . Allergic urticaria 12/21/2015  . Poor appetite 11/27/2015  . Ganglion cyst  07/12/2015  . Piriformis syndrome of right side 03/02/2014  . Osteopenia 12/13/2013  . Vaginal dryness 12/13/2013  . Abdominal pain 11/02/2013  . Peripheral neuropathy 11/02/2013  . Bilateral hand numbness 09/02/2013  . Health care maintenance 11/17/2012  . Arthritis of knee, right 08/16/2012  . Breast cancer, right breast ([place]) 09/25/2010  . COLONIC POLYPS, HX OF 07/19/2007  . OBESITY NOS 07/09/2006  . Type 2 diabetes mellitus with renal manifestations ([place]) 03/10/2006  . Hyperlipidemia 03/10/2006  . Hypertension associated with diabetes ([place]) 03/10/2006  . ALLERGIC RHINITIS 03/10/2006  . Fatty liver 03/10/2006   Past Medical History:  Diagnosis Date  . Allergy   . Anemia   . Breast CA ([place])    (Rt) breast ca dx 4/12---  S/P RADICAL MASTECTOMY AND CHEMORADIATION  . Diabetes mellitus ORAL MED  . Fatty liver disease, nonalcoholic   . History of hyperkalemia   . Hx antineoplastic chemo  10/12 - 06/24/11   PACLITAX EL COMPLETED 06/24/11  . Hyperlipidemia   . Hypertension   . Mild nonproliferative diabetic retinopathy of right eye ([place]) 03/19/11   [name]  . Numbness and tingling    Hx: of in fingers and toes since chemotherapy  . OA (osteoarthritis) of knee    RIGHT LEG  . Obesity   . Renal lesion     Family History  Problem Relation Age of Onset  . Stroke Father   . Heart disease Sister   . Cancer Sister        breast  . Heart disease Brother   . Cancer Sister        COLON CANCER    Past Surgical History:  Procedure Laterality Date  . COLONOSCOPY W/ POLYPECTOMY     Hx; of  . I&D KNEE WITH POLY EXCHANGE Right 09/03/2012   Procedure: IRRIGATION AND DEBRIDEMENT RIGHT KNEE WITH POLY EXCHANGE;  Surgeon: Jillian Salen, MD;  Location: [facility];  Service: Orthopedics;  Laterality: Right;  . knee arthroscopy  10-28-1999   RIGHT  . Laparoscopy with laparoscopic right salpingo oophorectomy and lysis of pelvic and abdominal adhesions  March 2013   Dr. Nori Ryan   .  MASTECTOMY MODIFIED RADICAL  12-04-2010   W/ LEFT PAC PLACEMENT (RIGHT BREAST W/ AXILLARY CONTENTS/ NODE BX'S  . PORT-A-CATH REMOVAL  09/24/2011   Procedure: REMOVAL PORT-A-CATH;  Surgeon: Jillian Faster. Cornett, MD;  Location: [facility];  Service: General;  Laterality: N/A;  . PORTACATH PLACEMENT     REPLACED PAC DUE TO MALFUNCTION (LEFT)  . TOTAL KNEE ARTHROPLASTY Right 08/16/2012   Procedure: TOTAL KNEE ARTHROPLASTY;  Surgeon: Jillian Salen, MD;  Location: [facility];  Service: Orthopedics;  Laterality: Right;  . TRANSTHORACIC ECHOCARDIOGRAM  10-29-2010  NORMAL LVF/ EF 55-60%/ MILD MV REGURG  . TUBAL LIGATION  YRS AGO  . VAGINAL HYSTERECTOMY  AGE 36   W/ LSO   Social History   Occupational History  . Not on file  Tobacco Use  . Smoking status: Never Smoker  . Smokeless tobacco: Never Used  Substance and Sexual Activity  . Alcohol use: No    Alcohol/week: 0.0 standard drinks  . Drug use: No    Types: Flunitrazepam  . Sexual activity: Not on file    Comment: [employer] AGE 49, G4, P4, [place] AGE 68, HRT 20+ YEARS

## 2019-01-25 NOTE — Patient Outreach (Signed)
[facility]) Care Management  01/25/2019  [name] January 03, 1946 [id number]   Medication Adherence call to [name] Compliant Voice message left with a call back number. [name] is showing past due on Lisinopril 20 mg under [place] [employer].   [facility] Management Direct Dial [phone]  Fax [phone] [name]

## 2019-01-26 LAB — HEMOGLOBIN A1C
Hgb A1c MFr Bld: 6.4 % of total Hgb — ABNORMAL HIGH (ref <5.7)
Mean Plasma Glucose: 137 (calc)
eAG (mmol/L): 7.6 (calc)

## 2019-01-26 LAB — EXTRA SPECIMEN

## 2019-01-26 NOTE — Progress Notes (Signed)
A1C is ok for left CTR

## 2019-02-02 ENCOUNTER — Other Ambulatory Visit: Payer: Self-pay | Admitting: *Deleted

## 2019-02-02 DIAGNOSIS — E785 Hyperlipidemia, unspecified: Secondary | ICD-10-CM

## 2019-02-02 MED ORDER — ATORVASTATIN CALCIUM 40 MG PO TABS: 40.0000 mg | ORAL_TABLET | Freq: Every day | ORAL | 1 refills | Status: DC

## 2019-02-03 ENCOUNTER — Encounter: Payer: Medicare Other | Admitting: Internal Medicine

## 2019-02-10 ENCOUNTER — Encounter: Payer: Self-pay | Admitting: Internal Medicine

## 2019-02-10 ENCOUNTER — Other Ambulatory Visit: Payer: Self-pay

## 2019-02-10 ENCOUNTER — Ambulatory Visit (INDEPENDENT_AMBULATORY_CARE_PROVIDER_SITE_OTHER): Payer: Medicare Other | Admitting: Internal Medicine

## 2019-02-10 VITALS — BP 114/58 | HR 85 | Temp 98.7°F | Ht 59.0 in | Wt 149.7 lb

## 2019-02-10 DIAGNOSIS — Z598 Other problems related to housing and economic circumstances: Secondary | ICD-10-CM

## 2019-02-10 DIAGNOSIS — E1159 Type 2 diabetes mellitus with other circulatory complications: Secondary | ICD-10-CM

## 2019-02-10 DIAGNOSIS — I10 Essential (primary) hypertension: Secondary | ICD-10-CM

## 2019-02-10 DIAGNOSIS — Z23 Encounter for immunization: Secondary | ICD-10-CM

## 2019-02-10 DIAGNOSIS — Z79899 Other long term (current) drug therapy: Secondary | ICD-10-CM

## 2019-02-10 DIAGNOSIS — I152 Hypertension secondary to endocrine disorders: Secondary | ICD-10-CM

## 2019-02-10 DIAGNOSIS — E1121 Type 2 diabetes mellitus with diabetic nephropathy: Secondary | ICD-10-CM

## 2019-02-10 DIAGNOSIS — Z7984 Long term (current) use of oral hypoglycemic drugs: Secondary | ICD-10-CM

## 2019-02-10 DIAGNOSIS — R5383 Other fatigue: Secondary | ICD-10-CM | POA: Diagnosis not present

## 2019-02-10 DIAGNOSIS — E1129 Type 2 diabetes mellitus with other diabetic kidney complication: Secondary | ICD-10-CM

## 2019-02-10 DIAGNOSIS — N289 Disorder of kidney and ureter, unspecified: Secondary | ICD-10-CM | POA: Diagnosis not present

## 2019-02-10 MED ORDER — MIRTAZAPINE 15 MG PO TABS: 15.0000 mg | ORAL_TABLET | Freq: Every day | ORAL | 0 refills | Status: DC

## 2019-02-10 NOTE — Progress Notes (Signed)
   CC: HTN, DM, fatigue  HPI:  [name] is a 73 y.o. female with PMHx listed below presenting for HTN, DM, fatigue. Please see the A&P for the status of the patient's chronic medical problems.  Past Medical History:  Diagnosis Date  . Allergy   . Anemia   . Breast CA ([place])    (Rt) breast ca dx 4/12---  S/P RADICAL MASTECTOMY AND CHEMORADIATION  . Diabetes mellitus ORAL MED  . Fatty liver disease, nonalcoholic   . History of hyperkalemia   . Hx antineoplastic chemo  10/12 - 06/24/11   PACLITAX EL COMPLETED 06/24/11  . Hyperlipidemia   . Hypertension   . Mild nonproliferative diabetic retinopathy of right eye ([name]) 03/19/11   Dr. Joseph Art  . Numbness and tingling    Hx: of in fingers and toes since chemotherapy  . OA (osteoarthritis) of knee    RIGHT LEG  . Obesity   . Renal lesion    Review of Systems:  Performed and all others negative.  Physical Exam: Vitals:   02/10/19 1313  BP: (!) 114/58  Pulse: 85  Temp: 98.7 F (37.1 C)  TempSrc: Oral  SpO2: 100%  Weight: 149 lb 11.2 oz (67.9 kg)  Height: 4\' 11"  (1.499 m)   General: Well nourished female in no acute distress Pulm: Good air movement with no wheezing or crackles  CV: RRR, no murmurs, no rubs   Assessment & Plan:   See Encounters Tab for problem based charting.  Patient discussed with Dr. Dareen Piano

## 2019-02-10 NOTE — Assessment & Plan Note (Signed)
HPI:  Diabetes is doing well. She checks her CBG every morning and the majority of the time it is <150. She is currently on Glipizide and metformin. She has had no signs or symptoms of hypoglycemia. She has been worried about hypoglycemia due to poor appetite and has been consuming glucerna shakes to keep her glucose up. Her weight remains stable. She tries to exercise but is limited by fatigue.   A/P: - Well controlled. Recent A1c <6.0  - Hold glipizide. Patient will call if CBGs increase and we can restart it at that point. - Continue metformin 500 mg BID

## 2019-02-10 NOTE — Assessment & Plan Note (Signed)
Vaccinated against influenza today 

## 2019-02-10 NOTE — Assessment & Plan Note (Signed)
HPI:  Patient voices concerns about fatigue over the past 2-3 months. States that she has a lot of stress at home with finances and they have filed for bankruptcy. Most nights she is unable to get to sleep and does not fall asleep until 1-2AM. She then wakes up at 7AM or earlier. She avoid caffeine in the evenings. She has also had decreased appetite and difficulty concentrating. She continues to have the same interests, does not have guilt, and does not have SI/HI.   A/P: - Her fatigue is likely from the sleep disturbances which is related to depression vs adjustment disorder.  - We will start her on Mirtazapine 15 mg QHS  - Phone call in 4 weeks to check status.

## 2019-02-10 NOTE — Patient Instructions (Addendum)
Thank you for allowing me to provide your care. You are doing a great job with your diabetes. Keep up the good work. Today we are:  1) Holding you glipizide. If you notice your blood sugar is going up call me and we can restart it.   2) You fatigue is likely due to not getting enough sleep. Therefore, we are starting a medication called Mirtazapine. This should help with your sleep and appetite.   3) We are checking some blood work and I will call you if we need to change anything.   I would like to see you back in 3 months or sooner if any issues arise.

## 2019-02-10 NOTE — Assessment & Plan Note (Signed)
HPI:  Well controlled HTN. Currently on chlorthalidone 25 mg QD and Lisinopril 20 mg QD. She is tolerating both these medication well without apparent side effects. No orthostatic symptoms. No issues affording them.   A/P: - Well controlled. Continue chlorthalidone 25 mg QD and Lisinopril 20 mg QD - Last BMP checked on 10/21/2018 with normal renal function, sodium, and potassium.

## 2019-02-11 LAB — TSH: TSH: 2.11 u[IU]/mL (ref 0.450–4.500)

## 2019-02-11 NOTE — Progress Notes (Signed)
Internal Medicine Clinic Attending  Case discussed with Dr. Helberg at the time of the visit.  We reviewed the resident's history and exam and pertinent patient test results.  I agree with the assessment, diagnosis, and plan of care documented in the resident's note.    

## 2019-02-14 ENCOUNTER — Encounter: Payer: Self-pay | Admitting: *Deleted

## 2019-02-16 DIAGNOSIS — H5203 Hypermetropia, bilateral: Secondary | ICD-10-CM | POA: Diagnosis not present

## 2019-02-16 DIAGNOSIS — H524 Presbyopia: Secondary | ICD-10-CM | POA: Diagnosis not present

## 2019-02-16 DIAGNOSIS — H25813 Combined forms of age-related cataract, bilateral: Secondary | ICD-10-CM | POA: Diagnosis not present

## 2019-02-17 ENCOUNTER — Other Ambulatory Visit: Payer: Self-pay | Admitting: Physician Assistant

## 2019-02-17 DIAGNOSIS — G5602 Carpal tunnel syndrome, left upper limb: Secondary | ICD-10-CM | POA: Diagnosis not present

## 2019-02-17 MED ORDER — HYDROCODONE-ACETAMINOPHEN 5-325 MG PO TABS: 1.0000 | ORAL_TABLET | Freq: Three times a day (TID) | ORAL | 0 refills | Status: DC | PRN

## 2019-02-28 ENCOUNTER — Other Ambulatory Visit: Payer: Self-pay | Admitting: *Deleted

## 2019-02-28 DIAGNOSIS — E1121 Type 2 diabetes mellitus with diabetic nephropathy: Secondary | ICD-10-CM

## 2019-03-01 MED ORDER — GLIPIZIDE ER 5 MG PO TB24: 5.0000 mg | ORAL_TABLET | Freq: Every day | ORAL | 1 refills | Status: DC

## 2019-03-02 ENCOUNTER — Other Ambulatory Visit: Payer: Self-pay | Admitting: *Deleted

## 2019-03-02 DIAGNOSIS — I10 Essential (primary) hypertension: Secondary | ICD-10-CM

## 2019-03-02 DIAGNOSIS — I152 Hypertension secondary to endocrine disorders: Secondary | ICD-10-CM

## 2019-03-02 DIAGNOSIS — E1159 Type 2 diabetes mellitus with other circulatory complications: Secondary | ICD-10-CM

## 2019-03-02 MED ORDER — CHLORTHALIDONE 25 MG PO TABS: 25.0000 mg | ORAL_TABLET | Freq: Every day | ORAL | 1 refills | Status: DC

## 2019-03-09 ENCOUNTER — Other Ambulatory Visit: Payer: Self-pay

## 2019-03-09 ENCOUNTER — Encounter: Payer: Self-pay | Admitting: Orthopaedic Surgery

## 2019-03-09 ENCOUNTER — Ambulatory Visit (INDEPENDENT_AMBULATORY_CARE_PROVIDER_SITE_OTHER): Payer: Medicare Other | Admitting: Orthopaedic Surgery

## 2019-03-09 VITALS — Ht 59.0 in | Wt 149.0 lb

## 2019-03-09 DIAGNOSIS — G5602 Carpal tunnel syndrome, left upper limb: Secondary | ICD-10-CM

## 2019-03-09 NOTE — Progress Notes (Signed)
Patient ID: [name], female   DOB: 08-Feb-1946, 73 y.o.   MRN: [id number]  [name] is 2-week status post left carpal tunnel release.  She is doing well and not taking anything for pain.  She has already noticed a significant improvement in her symptoms.  She has no complaints today.  Surgical incision is healed.  Sutures are intact.  We remove the sutures in place Steri-Strips.  Activity restrictions were again reviewed today.  I would like to recheck her in 4 weeks for final visit.

## 2019-04-06 ENCOUNTER — Encounter: Payer: Self-pay | Admitting: Orthopaedic Surgery

## 2019-04-06 ENCOUNTER — Other Ambulatory Visit: Payer: Self-pay

## 2019-04-06 ENCOUNTER — Ambulatory Visit (INDEPENDENT_AMBULATORY_CARE_PROVIDER_SITE_OTHER): Payer: Medicare Other | Admitting: Orthopaedic Surgery

## 2019-04-06 ENCOUNTER — Telehealth: Payer: Self-pay | Admitting: Radiology

## 2019-04-06 DIAGNOSIS — M1712 Unilateral primary osteoarthritis, left knee: Secondary | ICD-10-CM

## 2019-04-06 DIAGNOSIS — G5602 Carpal tunnel syndrome, left upper limb: Secondary | ICD-10-CM

## 2019-04-06 NOTE — Telephone Encounter (Signed)
Please obtain authorization for left knee visco injection. Thanks.

## 2019-04-06 NOTE — Progress Notes (Signed)
Office Visit Note   Patient: [name]           Date of Birth: 10/27/1945           MRN: [id number] Visit Date: 04/06/2019              Requested by: Ina Homes, MD [address] [place],  [place] [address] PCP: Ina Homes, MD   Assessment & Plan: Visit Diagnoses:  1. Unilateral primary osteoarthritis, left knee   2. Left carpal tunnel syndrome     Plan: Impression is status post left carpal tunnel release.  #2 end-stage generative joint disease left knee.  In regards to the left carpal tunnel, she is doing well.  She will advance with activity as tolerated and follow-up with [place] as needed.  In regards to the left knee, we will submit approval for viscosupplementation injection.  Follow-up with [place] once she has been approved.  She does note definitive treatment is a left total knee replacement.  This patient is diagnosed with osteoarthritis of the knee(s).    Radiographs show evidence of joint space narrowing, osteophytes, subchondral sclerosis and/or subchondral cysts.  This patient has knee pain which interferes with functional and activities of daily living.    This patient has experienced inadequate response, adverse effects and/or intolerance with conservative treatments such as acetaminophen, NSAIDS, topical creams, physical therapy or regular exercise, knee bracing and/or weight loss.   This patient has experienced inadequate response or has a contraindication to intra articular steroid injections for at least 3 months.   This patient is not scheduled to have a total knee replacement within 6 months of starting treatment with viscosupplementation.  Follow-Up Instructions: Return for once approved for right knee visco inj.   Orders:  No orders of the defined types were placed in this encounter.  No orders of the defined types were placed in this encounter.     Procedures: No procedures performed   Clinical Data: No additional findings.   Subjective:  Chief Complaint  Patient presents with  . Left Hand - Follow-up    03/09/2019 Left CTR    HPI patient is a pleasant 73 year old female who presents our clinic today 6 weeks status post left carpal tunnel release.  She has been doing well.  No complaints other than occasional soreness over the incision site.  She has very rare numbness and tingling to her fingers at this point.  Her main issue today is her left knee.  History of end-stage degenerative joint disease.  She was seen in our office at the end of September of this year where her left knee was injected with cortisone.  She notes that this significantly helped but only lasted about 3 or 4 weeks.  Her pain has returned and is started to worsen since return of cold temperatures.  She also has increased pain with activity.  She takes over-the-counter pain medication as needed which does seem to help.  No previous viscosupplementation injection.  Of note, she is status post right total knee replacement by Dr. Mayer Camel in 2014 with what looks like a revision with polyexchange less than a month later.  She has since been doing well in regards to the right knee.  Review of Systems as detailed in HPI.  All others reviewed and are negative.   Objective: Vital Signs: There were no vitals taken for this visit.  Physical Exam well-developed well-nourished female no acute distress.  Alert oriented x3.  Ortho Exam examination of  the left wrist reveals a fully healed surgical scar.  No evidence of infection or cellulitis.  Full sensation to all 5 fingers.  Left knee exam shows a trace effusion.  Range of motion 0 to 115 degrees.  Minimal tenderness medial joint line.  Marked palpable crepitus.  She is neurovascular intact distally.  Specialty Comments:  No specialty comments available.  Imaging: No new imaging   PMFS History: Patient Active Problem List   Diagnosis Date Noted  . Fatigue 02/10/2019  . Primary osteoarthritis of left knee 01/25/2019   . Left carpal tunnel syndrome 01/25/2019  . Cervical neuropathy 08/20/2018  . Bilateral carpal tunnel syndrome 06/29/2018  . Depressed mood 01/21/2018  . Need for immunization against influenza 01/17/2018  . Osteopenia 12/13/2013  . Vaginal dryness 12/13/2013  . Peripheral neuropathy 11/02/2013  . Health care maintenance 11/17/2012  . Arthritis of knee, right 08/16/2012  . Breast cancer, right breast ([facility]) 09/25/2010  . COLONIC POLYPS, HX OF 07/19/2007  . OBESITY NOS 07/09/2006  . Type 2 diabetes mellitus with renal manifestations ([facility]) 03/10/2006  . Hyperlipidemia 03/10/2006  . Hypertension associated with diabetes ([facility]) 03/10/2006  . ALLERGIC RHINITIS 03/10/2006  . Fatty liver 03/10/2006   Past Medical History:  Diagnosis Date  . Allergy   . Anemia   . Breast CA ([facility])    (Rt) breast ca dx 4/12---  S/P RADICAL MASTECTOMY AND CHEMORADIATION  . Diabetes mellitus ORAL MED  . Fatty liver disease, nonalcoholic   . History of hyperkalemia   . Hx antineoplastic chemo  10/12 - 06/24/11   PACLITAX EL COMPLETED 06/24/11  . Hyperlipidemia   . Hypertension   . Mild nonproliferative diabetic retinopathy of right eye ([facility]) 03/19/11   Dr. Joseph Art  . Numbness and tingling    Hx: of in fingers and toes since chemotherapy  . OA (osteoarthritis) of knee    RIGHT LEG  . Obesity   . Renal lesion     Family History  Problem Relation Age of Onset  . Stroke Father   . Heart disease Sister   . Cancer Sister        breast  . Heart disease Brother   . Cancer Sister        COLON CANCER    Past Surgical History:  Procedure Laterality Date  . COLONOSCOPY W/ POLYPECTOMY     Hx; of  . I&D KNEE WITH POLY EXCHANGE Right 09/03/2012   Procedure: IRRIGATION AND DEBRIDEMENT RIGHT KNEE WITH POLY EXCHANGE;  Surgeon: Kerin Salen, MD;  Location: [facility];  Service: Orthopedics;  Laterality: Right;  . knee arthroscopy  10-28-1999   RIGHT  . Laparoscopy with laparoscopic right salpingo  oophorectomy and lysis of pelvic and abdominal adhesions  March 2013   Dr. Nori Riis   . MASTECTOMY MODIFIED RADICAL  12-04-2010   W/ LEFT PAC PLACEMENT (RIGHT BREAST W/ AXILLARY CONTENTS/ NODE BX'S  . PORT-A-CATH REMOVAL  09/24/2011   Procedure: REMOVAL PORT-A-CATH;  Surgeon: Joyice Faster. Cornett, MD;  Location: [facility];  Service: General;  Laterality: N/A;  . PORTACATH PLACEMENT     REPLACED PAC DUE TO MALFUNCTION (LEFT)  . TOTAL KNEE ARTHROPLASTY Right 08/16/2012   Procedure: TOTAL KNEE ARTHROPLASTY;  Surgeon: Kerin Salen, MD;  Location: [facility];  Service: Orthopedics;  Laterality: Right;  . TRANSTHORACIC ECHOCARDIOGRAM  10-29-2010   NORMAL LVF/ EF 55-60%/ MILD MV REGURG  . TUBAL LIGATION  YRS AGO  . VAGINAL HYSTERECTOMY  AGE 21   W/  LSO   Social History   Occupational History  . Not on file  Tobacco Use  . Smoking status: Never Smoker  . Smokeless tobacco: Never Used  Substance and Sexual Activity  . Alcohol use: No    Alcohol/week: 0.0 standard drinks  . Drug use: No    Types: Flunitrazepam  . Sexual activity: Not on file    Comment: MENARCHE AGE 8, G4, P4, PARITY AGE 75, HRT 20+ YEARS

## 2019-04-11 ENCOUNTER — Telehealth: Payer: Self-pay

## 2019-04-11 NOTE — Telephone Encounter (Signed)
Submitted VOB for SynviscOne, left knee. 

## 2019-04-11 NOTE — Telephone Encounter (Signed)
Noted  

## 2019-04-18 ENCOUNTER — Telehealth: Payer: Self-pay

## 2019-04-18 NOTE — Telephone Encounter (Signed)
Patient aware that she is approved for gel injection.  Approved for SynviscOne, left knee. [place] Patient will be responsible for 20% OOP per insurance. Co-pay of $35.00 required No PA required  Appt. 04/22/2019 with Dr. Erlinda Hong

## 2019-04-22 ENCOUNTER — Other Ambulatory Visit: Payer: Self-pay

## 2019-04-22 ENCOUNTER — Ambulatory Visit (INDEPENDENT_AMBULATORY_CARE_PROVIDER_SITE_OTHER): Payer: Medicare Other | Admitting: Orthopaedic Surgery

## 2019-04-22 ENCOUNTER — Encounter: Payer: Self-pay | Admitting: Orthopaedic Surgery

## 2019-04-22 VITALS — Ht 59.0 in | Wt 149.0 lb

## 2019-04-22 DIAGNOSIS — M1712 Unilateral primary osteoarthritis, left knee: Secondary | ICD-10-CM

## 2019-04-22 MED ORDER — LIDOCAINE HCL 1 % IJ SOLN
2.0000 mL | INTRAMUSCULAR | Status: AC | PRN
  Administered 2019-04-22: 2 mL

## 2019-04-22 MED ORDER — BUPIVACAINE HCL 0.25 % IJ SOLN
2.0000 mL | INTRAMUSCULAR | Status: AC | PRN
  Administered 2019-04-22: 2 mL via INTRA_ARTICULAR

## 2019-04-22 MED ORDER — HYLAN G-F 20 48 MG/6ML IX SOSY
48.0000 mg | PREFILLED_SYRINGE | INTRA_ARTICULAR | Status: AC | PRN
  Administered 2019-04-22: 48 mg via INTRA_ARTICULAR

## 2019-04-22 NOTE — Progress Notes (Signed)
   Procedure Note  Patient: [name]             Date of Birth: Aug 11, 1945           MRN: [id number]             Visit Date: 04/22/2019  Procedures: Visit Diagnoses:  1. Unilateral primary osteoarthritis, left knee     Large Joint Inj: L knee on 04/22/2019 11:13 AM Indications: pain Details: 22 G needle, anterolateral approach Medications: 2 mL lidocaine 1 %; 2 mL bupivacaine 0.25 %; 48 mg Hylan 48 MG/6ML

## 2019-05-09 ENCOUNTER — Other Ambulatory Visit: Payer: Self-pay | Admitting: *Deleted

## 2019-05-09 DIAGNOSIS — E1159 Type 2 diabetes mellitus with other circulatory complications: Secondary | ICD-10-CM

## 2019-05-09 DIAGNOSIS — E1121 Type 2 diabetes mellitus with diabetic nephropathy: Secondary | ICD-10-CM

## 2019-05-09 DIAGNOSIS — I152 Hypertension secondary to endocrine disorders: Secondary | ICD-10-CM

## 2019-05-10 MED ORDER — LISINOPRIL 20 MG PO TABS: 20.0000 mg | ORAL_TABLET | Freq: Every day | ORAL | 1 refills | Status: DC

## 2019-05-12 ENCOUNTER — Ambulatory Visit (INDEPENDENT_AMBULATORY_CARE_PROVIDER_SITE_OTHER): Payer: Medicare Other | Admitting: Internal Medicine

## 2019-05-12 ENCOUNTER — Encounter: Payer: Self-pay | Admitting: Internal Medicine

## 2019-05-12 VITALS — BP 92/50 | HR 83 | Temp 98.3°F | Ht 59.0 in | Wt 149.1 lb

## 2019-05-12 DIAGNOSIS — E1122 Type 2 diabetes mellitus with diabetic chronic kidney disease: Secondary | ICD-10-CM

## 2019-05-12 DIAGNOSIS — Z79899 Other long term (current) drug therapy: Secondary | ICD-10-CM

## 2019-05-12 DIAGNOSIS — E1159 Type 2 diabetes mellitus with other circulatory complications: Secondary | ICD-10-CM

## 2019-05-12 DIAGNOSIS — E1129 Type 2 diabetes mellitus with other diabetic kidney complication: Secondary | ICD-10-CM | POA: Diagnosis not present

## 2019-05-12 DIAGNOSIS — I152 Hypertension secondary to endocrine disorders: Secondary | ICD-10-CM

## 2019-05-12 DIAGNOSIS — Z7984 Long term (current) use of oral hypoglycemic drugs: Secondary | ICD-10-CM

## 2019-05-12 DIAGNOSIS — I10 Essential (primary) hypertension: Secondary | ICD-10-CM | POA: Diagnosis not present

## 2019-05-12 DIAGNOSIS — N289 Disorder of kidney and ureter, unspecified: Secondary | ICD-10-CM | POA: Diagnosis not present

## 2019-05-12 LAB — POCT GLYCOSYLATED HEMOGLOBIN (HGB A1C): Hemoglobin A1C: 7.3 % — AB (ref 4.0–5.6)

## 2019-05-12 LAB — GLUCOSE, CAPILLARY: Glucose-Capillary: 115 mg/dL — ABNORMAL HIGH (ref 70–99)

## 2019-05-12 MED ORDER — ACCU-CHEK GUIDE VI STRP: ORAL_STRIP | 3 refills | Status: DC

## 2019-05-12 MED ORDER — ACCU-CHEK SOFTCLIX LANCETS MISC: 3 refills | Status: DC

## 2019-05-12 NOTE — Assessment & Plan Note (Signed)
Patient with well-controlled diabetes. She checks her CBG every morning and states that is consistently around 100. She did not bring her meter today. At her last visit we stopped her glipizide. Since her last visit she has discontinued her glipizide and also decreased her metformin to 500 mg once a day. She continues to drink one soda a day. Her weight remains stable. She has been trying to exercise.  Repeat A1c today is 7.3.  A/P: - Diabetes is well controlled. Continue metformin 500 mg once daily. - Continue Lisinopril 20 mg QD and atorvastatin 40 mg QD  - Foot exam performed - Diabetic eye exam in 12/2018

## 2019-05-12 NOTE — Progress Notes (Signed)
   CC: HTN and DM  HPI:  [name] is a 74 y.o. female with PMHx listed below presenting for DM and HTN. Please see the A&P for the status of the patient's chronic medical problems.  Past Medical History:  Diagnosis Date  . Allergy   . Anemia   . Breast CA ([facility])    (Rt) breast ca dx 4/12---  S/P RADICAL MASTECTOMY AND CHEMORADIATION  . Diabetes mellitus ORAL MED  . Fatty liver disease, nonalcoholic   . History of hyperkalemia   . Hx antineoplastic chemo  10/12 - 06/24/11   PACLITAX EL COMPLETED 06/24/11  . Hyperlipidemia   . Hypertension   . Mild nonproliferative diabetic retinopathy of right eye ([name]) 03/19/11   Dr. Joseph Ryan  . Numbness and tingling    Hx: of in fingers and toes since chemotherapy  . OA (osteoarthritis) of knee    RIGHT LEG  . Obesity   . Renal lesion    Review of Systems:  Performed and all others negative.  Physical Exam: Vitals:   05/12/19 1319  BP: (!) 92/50  Pulse: 83  Temp: 98.3 F (36.8 C)  TempSrc: Oral  SpO2: 100%  Weight: 149 lb 1.6 oz (67.6 kg)  Height: 4\' 11"  (1.499 m)   General: Well nourished female in no acute distress Pulm: Good air movement with no wheezing or crackles  CV: RRR, no murmurs, no rubs   Assessment & Plan:   See Encounters Tab for problem based charting.  Patient discussed with Dr. Philipp Ryan

## 2019-05-12 NOTE — Progress Notes (Signed)
Internal Medicine Clinic Attending  Case discussed with Dr. Helberg  soon after the resident saw the patient.  We reviewed the resident's history and exam and pertinent patient test results.  I agree with the assessment, diagnosis, and plan of care documented in the resident's note.  

## 2019-05-12 NOTE — Assessment & Plan Note (Signed)
Patient with well-controlled hypertension. She is currently on chlorthalidone 25 mg q.d. and lisinopril 20 mg q.d. She did not take these medications today. Her blood pressure is soft but she is wearing a very baggy long sleeve shirt. She is not lightheaded or dizzy with ambulation or with position change. She is otherwise tolerating these medications well. I reviewed her previous BMP with normal renal function and potassium.  A/P: - I asked the patient to hold her chlorthalidone and lisinopril today. She will check her blood pressure tomorrow. If greater than 130/80 she will continue chlorthalidone 25 mg once daily and lisinopril 20 mg once daily - Recheck BMP at 6 month follow-up

## 2019-05-12 NOTE — Patient Instructions (Signed)
Thank you for allowing me to provide your care. Today we discussed the following:  1) Do not take your blood pressure medications today. Please check it tomorrow. If it is greater than 130/80 you can resume your medications.  2) For your diabetes it is okay that you dropped to metformin once a day. Continue to hold off on taking glipizide. Continue to work on cutting back the soda intake.  I will see you back in six months or sooner if any issues arise.

## 2019-05-16 ENCOUNTER — Other Ambulatory Visit: Payer: Self-pay | Admitting: *Deleted

## 2019-05-16 DIAGNOSIS — E1121 Type 2 diabetes mellitus with diabetic nephropathy: Secondary | ICD-10-CM

## 2019-05-16 MED ORDER — METFORMIN HCL ER 500 MG PO TB24: 500.0000 mg | ORAL_TABLET | Freq: Every day | ORAL | 3 refills | Status: DC

## 2019-05-30 ENCOUNTER — Ambulatory Visit: Payer: Medicare Other | Attending: Internal Medicine

## 2019-05-30 DIAGNOSIS — Z20822 Contact with and (suspected) exposure to covid-19: Secondary | ICD-10-CM | POA: Diagnosis not present

## 2019-05-31 LAB — NOVEL CORONAVIRUS, NAA: SARS-CoV-2, NAA: NOT DETECTED

## 2019-06-16 ENCOUNTER — Encounter: Payer: Self-pay | Admitting: Orthopaedic Surgery

## 2019-06-16 ENCOUNTER — Other Ambulatory Visit: Payer: Self-pay

## 2019-06-16 ENCOUNTER — Ambulatory Visit (INDEPENDENT_AMBULATORY_CARE_PROVIDER_SITE_OTHER): Payer: Medicare Other | Admitting: Orthopaedic Surgery

## 2019-06-16 DIAGNOSIS — M1712 Unilateral primary osteoarthritis, left knee: Secondary | ICD-10-CM | POA: Diagnosis not present

## 2019-06-16 MED ORDER — LIDOCAINE HCL 1 % IJ SOLN
2.0000 mL | INTRAMUSCULAR | Status: AC | PRN
  Administered 2019-06-16: 2 mL

## 2019-06-16 MED ORDER — METHYLPREDNISOLONE ACETATE 40 MG/ML IJ SUSP
40.0000 mg | INTRAMUSCULAR | Status: AC | PRN
  Administered 2019-06-16: 40 mg via INTRA_ARTICULAR

## 2019-06-16 MED ORDER — BUPIVACAINE HCL 0.25 % IJ SOLN
2.0000 mL | INTRAMUSCULAR | Status: AC | PRN
  Administered 2019-06-16: 2 mL via INTRA_ARTICULAR

## 2019-06-16 NOTE — Progress Notes (Signed)
Office Visit Note   Patient: [name]           Date of Birth: 1945/09/01           MRN: [id number] Visit Date: 06/16/2019              Requested by: Ina Homes, MD [address] [place],  [place] [address] PCP: Ina Homes, MD   Assessment & Plan: Visit Diagnoses:  1. Unilateral primary osteoarthritis, left knee     Plan: Impression is advanced generative joint disease left knee.  Patient would like a cortisone injection to the left knee.  She is thinking about proceeding with definitive treatment of a left total knee replacement before the year is over.  She will follow up with [place] when she is ready.  Call with concerns or questions in the meantime.  Follow-Up Instructions: Return if symptoms worsen or fail to improve.   Orders:  Orders Placed This Encounter  Procedures   Large Joint Inj: L knee   No orders of the defined types were placed in this encounter.     Procedures: Large Joint Inj: L knee on 06/16/2019 11:24 AM Indications: pain Details: 22 G needle, anterolateral approach Medications: 2 mL lidocaine 1 %; 2 mL bupivacaine 0.25 %; 40 mg methylPREDNISolone acetate 40 MG/ML      Clinical Data: No additional findings.   Subjective: Chief Complaint  Patient presents with   Left Knee - Pain    HPI patient is a pleasant 74 year old female who comes in today with recurrent left knee pain.  History of advanced degenerative changes the left knee.  She has been seeing [place] for this for a while now.  She had a cortisone injection in September 2020 with moderate relief for a few months.  She then had a viscosupplementation injection this past December which helped until recently.  Her pain has returned and has started to worsen over the past few days.  Pain is worse the medial aspect.  She describes this as a constant ache with associated stiffness.  Pain is aggravated with ambulation as well as with cold weather.  Oral OTC pain medication has not been  helping.  Review of Systems as detailed in HPI.  All other reviewed and are negative.   Objective: Vital Signs: There were no vitals taken for this visit.  Physical Exam well-developed well-nourished female no acute distress.  Alert and oriented x3.  Ortho Exam examination of the left knee shows a trace effusion.  Range of motion 0 to 115 degrees.  Medial joint line tenderness.  Moderate patellofemoral crepitus.  She is neurovascular intact distally.  Specialty Comments:  No specialty comments available.  Imaging: No new imaging  PMFS History: Patient Active Problem List   Diagnosis Date Noted   Fatigue 02/10/2019   Primary osteoarthritis of left knee 01/25/2019   Left carpal tunnel syndrome 01/25/2019   Cervical neuropathy 08/20/2018   Bilateral carpal tunnel syndrome 06/29/2018   Depressed mood 01/21/2018   Need for immunization against influenza 01/17/2018   Osteopenia 12/13/2013   Vaginal dryness 12/13/2013   Peripheral neuropathy 11/02/2013   Health care maintenance 11/17/2012   Arthritis of knee, right 08/16/2012   Breast cancer, right breast ([facility]) 09/25/2010   COLONIC POLYPS, HX OF 07/19/2007   OBESITY NOS 07/09/2006   Type 2 diabetes mellitus with renal manifestations ([facility]) 03/10/2006   Hyperlipidemia 03/10/2006   Hypertension associated with diabetes ([facility]) 03/10/2006   ALLERGIC RHINITIS 03/10/2006   Fatty  liver 03/10/2006   Past Medical History:  Diagnosis Date   Allergy    Anemia    Breast CA ([facility])    (Rt) breast ca dx 4/12---  S/P RADICAL MASTECTOMY AND CHEMORADIATION   Diabetes mellitus ORAL MED   Fatty liver disease, nonalcoholic    History of hyperkalemia    Hx antineoplastic chemo  10/12 - 06/24/11   PACLITAX EL COMPLETED 06/24/11   Hyperlipidemia    Hypertension    Mild nonproliferative diabetic retinopathy of right eye ([facility]) 03/19/11   Dr. Benjaman Pott Poudyal   Numbness and tingling    Hx: of in fingers and toes since  chemotherapy   OA (osteoarthritis) of knee    RIGHT LEG   Obesity    Renal lesion     Family History  Problem Relation Age of Onset   Stroke Father    Heart disease Sister    Cancer Sister        breast   Heart disease Brother    Cancer Sister        COLON CANCER    Past Surgical History:  Procedure Laterality Date   COLONOSCOPY W/ POLYPECTOMY     Hx; of   I & D KNEE WITH POLY EXCHANGE Right 09/03/2012   Procedure: IRRIGATION AND DEBRIDEMENT RIGHT KNEE WITH POLY EXCHANGE;  Surgeon: Kerin Salen, MD;  Location: [facility];  Service: Orthopedics;  Laterality: Right;   knee arthroscopy  10-28-1999   RIGHT   Laparoscopy with laparoscopic right salpingo oophorectomy and lysis of pelvic and abdominal adhesions  March 2013   Dr. Nori Riis    MASTECTOMY MODIFIED RADICAL  12-04-2010   W/ LEFT PAC PLACEMENT (RIGHT BREAST W/ AXILLARY CONTENTS/ NODE BX'S   PORT-A-CATH REMOVAL  09/24/2011   Procedure: REMOVAL PORT-A-CATH;  Surgeon: Joyice Faster. Cornett, MD;  [facility];  Service: General;  Laterality: N/A;   PORTACATH PLACEMENT     REPLACED PAC DUE TO MALFUNCTION (LEFT)   TOTAL KNEE ARTHROPLASTY Right 08/16/2012   Procedure: TOTAL KNEE ARTHROPLASTY;  Surgeon: Kerin Salen, MD;  Location: [facility];  Service: Orthopedics;  Laterality: Right;   TRANSTHORACIC ECHOCARDIOGRAM  10-29-2010   NORMAL LVF/ EF 55-60%/ MILD MV REGURG   TUBAL LIGATION  YRS AGO   VAGINAL HYSTERECTOMY  AGE 66   W/ LSO   Social History   Occupational History   Not on file  Tobacco Use   Smoking status: Never Smoker   Smokeless tobacco: Never Used  Substance and Sexual Activity   Alcohol use: No    Alcohol/week: 0.0 standard drinks   Drug use: No    Types: Flunitrazepam   Sexual activity: Not on file    Comment: MENARCHE AGE 5, G4, P4, PARITY AGE 52, HRT 20+ YEARS

## 2019-06-22 ENCOUNTER — Ambulatory Visit: Payer: Medicare Other | Attending: Family Medicine

## 2019-06-22 ENCOUNTER — Other Ambulatory Visit: Payer: Self-pay

## 2019-06-22 DIAGNOSIS — Z23 Encounter for immunization: Secondary | ICD-10-CM

## 2019-06-22 NOTE — Progress Notes (Signed)
   Covid-19 Vaccination Clinic  Name:  [name]    MRN: [id number] DOB: [birth year]  06/22/2019  [name] was observed post Covid-19 immunization for 15 minutes without incidence. She was provided with Vaccine Information Sheet and instruction to access the V-Safe system.   [name] was instructed to call 911 with any severe reactions post vaccine: Marland Kitchen Difficulty breathing  . Swelling of your face and throat  . A fast heartbeat  . A bad rash all over your body  . Dizziness and weakness    Immunizations Administered    Name Date Dose VIS Date Route   Moderna COVID-19 Vaccine 06/22/2019 11:45 AM 0.5 mL 04/05/2019 Intramuscular   Manufacturer: Moderna   Lot: [id number]   [id number]

## 2019-07-04 ENCOUNTER — Telehealth: Payer: Self-pay | Admitting: Orthopaedic Surgery

## 2019-07-04 NOTE — Telephone Encounter (Signed)
Patient called.   She would like a call back to discuss knee replacement surgery.   Call back number: [phone]

## 2019-07-04 NOTE — Telephone Encounter (Signed)
Patient called.   She would like a call back to discuss a knee replacement surgery.   Call back number: [phone]

## 2019-07-04 NOTE — Telephone Encounter (Signed)
Please schedule for left TKA.  [place]. 23 hr eval.  Thanks.

## 2019-07-05 NOTE — Telephone Encounter (Signed)
Spoke with patient and scheduled surgery. °

## 2019-07-14 ENCOUNTER — Other Ambulatory Visit: Payer: Self-pay

## 2019-07-20 ENCOUNTER — Ambulatory Visit: Payer: Medicare Other | Attending: Internal Medicine

## 2019-07-20 DIAGNOSIS — Z23 Encounter for immunization: Secondary | ICD-10-CM

## 2019-07-20 NOTE — Progress Notes (Signed)
   Covid-19 Vaccination Clinic  Name:  [name]    MRN: [id number] DOB: [birth year]  07/20/2019  [name] was observed post Covid-19 immunization for 15 minutes without incident. She was provided with Vaccine Information Sheet and instruction to access the V-Safe system.   [name] was instructed to call 911 with any severe reactions post vaccine: Marland Kitchen Difficulty breathing  . Swelling of face and throat  . A fast heartbeat  . A bad rash all over body  . Dizziness and weakness   Immunizations Administered    Name Date Dose VIS Date Route   Moderna COVID-19 Vaccine 07/20/2019 11:19 AM 0.5 mL 04/05/2019 Intramuscular   Manufacturer: Moderna   Lot: [id number]   [id number]

## 2019-08-10 NOTE — Progress Notes (Signed)
[facility] [place], [place] AT [facility] OF [address] & [place] [address] Phone: [phone] Fax: [phone]      Your procedure is scheduled on Monday April 12  Report to [facility] Main Entrance "A" at 0530 A.M., and check in at the Admitting office.  Call this number if you have problems the morning of surgery:  [phone]  Call [phone] if you have any questions prior to your surgery date Monday-Friday 8am-4pm    Remember:  Do not eat  after midnight the night before your surgery  You may drink clear liquids until 0430 am the morning of your surgery.   Clear liquids allowed are: Water, Non-Citrus Juices (without pulp), Carbonated Beverages, Clear Tea, Black Coffee Only, and Gatorade    Enhanced Recovery after Surgery for Orthopedics Enhanced Recovery after Surgery is a protocol used to improve the stress on your body and your recovery after surgery.  Patient Instructions  . The night before surgery:  o No food after midnight. ONLY clear liquids after midnight  . The day of surgery (if you have diabetes): o  o Drink ONE (1) 10oz bottle of water as directed. o This drink was given to you during your hospital  pre-op appointment visit.  o The pre-op nurse will instruct you on the time to drink the   10 oz bottle of water depending on your surgery time. o Nothing else to drink after completing the  10 oz bottle of water).         If you have questions, please contact your surgeon's office.   Take these medicines the morning of surgery with A SIP OF WATER  fluticasone (FLONASE)  Eye drops if needed   As of today, STOP taking any Aspirin (unless otherwise instructed by your surgeon) and Aspirin containing products, Aleve, Naproxen, Ibuprofen, Motrin, Advil, Goody's, BC's, all herbal medications, fish oil, and all vitamins.   WHAT DO I DO ABOUT MY DIABETES MEDICATION?   Marland Kitchen Do not take oral diabetes  medicines (pills) the morning of surgery.metFORMIN (GLUCOPHAGE-XR)   HOW TO MANAGE YOUR DIABETES BEFORE AND AFTER SURGERY  Why is it important to control my blood sugar before and after surgery? . Improving blood sugar levels before and after surgery helps healing and can limit problems. . A way of improving blood sugar control is eating a healthy diet by: o  Eating less sugar and carbohydrates o  Increasing activity/exercise o  Talking with your doctor about reaching your blood sugar goals . High blood sugars (greater than 180 mg/dL) can raise your risk of infections and slow your recovery, so you will need to focus on controlling your diabetes during the weeks before surgery. . Make sure that the doctor who takes care of your diabetes knows about your planned surgery including the date and location.  How do I manage my blood sugar before surgery? . Check your blood sugar at least 4 times a day, starting 2 days before surgery, to make sure that the level is not too high or low. . Check your blood sugar the morning of your surgery when you wake up and every 2 hours until you get to the Short Stay unit. o If your blood sugar is less than 70 mg/dL, you will need to treat for low blood sugar: - Do not take insulin. - Treat a low blood sugar (less than 70 mg/dL) with  cup of clear  juice (cranberry or apple), 4 glucose tablets, OR glucose gel. - Recheck blood sugar in 15 minutes after treatment (to make sure it is greater than 70 mg/dL). If your blood sugar is not greater than 70 mg/dL on recheck, call [phone] for further instructions. . Report your blood sugar to the short stay nurse when you get to Short Stay.  . If you are admitted to the hospital after surgery: o Your blood sugar will be checked by the staff and you will probably be given insulin after surgery (instead of oral diabetes medicines) to make sure you have good blood sugar levels. o The goal for blood sugar control after  surgery is 80-180 mg/dL.                     Do not wear jewelry, make up, or nail polish            Do not wear lotions, powders, perfumes/colognes, or deodorant.            Do not shave 48 hours prior to surgery.  Men may shave face and neck.            Do not bring valuables to the hospital.            [facility] is not responsible for any belongings or valuables.  Do NOT Smoke (Tobacco/Vapping) or drink Alcohol 24 hours prior to your procedure If you use a CPAP at night, you may bring all equipment for your overnight stay.   Contacts, glasses, dentures or bridgework may not be worn into surgery.      For patients admitted to the hospital, discharge time will be determined by your treatment team.   Patients discharged the day of surgery will not be allowed to drive home, and someone needs to stay with them for 24 hours.    Special instructions:   [place]- Preparing For Surgery  Before surgery, you can play an important role. Because skin is not sterile, your skin needs to be as free of germs as possible. You can reduce the number of germs on your skin by washing with CHG (chlorahexidine gluconate) Soap before surgery.  CHG is an antiseptic cleaner which kills germs and bonds with the skin to continue killing germs even after washing.    Oral Hygiene is also important to reduce your risk of infection.  Remember - BRUSH YOUR TEETH THE MORNING OF SURGERY WITH YOUR REGULAR TOOTHPASTE  Please do not use if you have an allergy to CHG or antibacterial soaps. If your skin becomes reddened/irritated stop using the CHG.  Do not shave (including legs and underarms) for at least 48 hours prior to first CHG shower. It is OK to shave your face.  Please follow these instructions carefully.   1. Shower the NIGHT BEFORE SURGERY and the MORNING OF SURGERY with CHG Soap.   2. If you chose to wash your hair, wash your hair first as usual with your normal shampoo.  3. After you shampoo, rinse  your hair and body thoroughly to remove the shampoo.  4. Use CHG as you would any other liquid soap. You can apply CHG directly to the skin and wash gently with a scrungie or a clean washcloth.   5. Apply the CHG Soap to your body ONLY FROM THE NECK DOWN.  Do not use on open wounds or open sores. Avoid contact with your eyes, ears, mouth and genitals (private parts). Wash Face and genitals (  private parts)  with your normal soap.   6. Wash thoroughly, paying special attention to the area where your surgery will be performed.  7. Thoroughly rinse your body with warm water from the neck down.  8. DO NOT shower/wash with your normal soap after using and rinsing off the CHG Soap.  9. Pat yourself dry with a CLEAN TOWEL.  10. Wear CLEAN PAJAMAS to bed the night before surgery, wear comfortable clothes the morning of surgery  11. Place CLEAN SHEETS on your bed the night of your first shower and DO NOT SLEEP WITH PETS.   Day of Surgery:   Do not apply any deodorants/lotions.  Please wear clean clothes to the hospital/surgery center.   Remember to brush your teeth WITH YOUR REGULAR TOOTHPASTE.   Please read over the following fact sheets that you were given.

## 2019-08-11 ENCOUNTER — Ambulatory Visit (HOSPITAL_COMMUNITY)
Admission: RE | Admit: 2019-08-11 | Discharge: 2019-08-11 | Disposition: A | Discharge status: Home / Self Care | Payer: Medicare Other | Source: Ambulatory Visit | Attending: Physician Assistant | Admitting: Physician Assistant

## 2019-08-11 ENCOUNTER — Other Ambulatory Visit (HOSPITAL_COMMUNITY)
Admission: RE | Admit: 2019-08-11 | Discharge: 2019-08-11 | Disposition: A | Discharge status: Home / Self Care | Payer: Medicare Other | Source: Ambulatory Visit | Attending: Orthopaedic Surgery | Admitting: Orthopaedic Surgery

## 2019-08-11 ENCOUNTER — Other Ambulatory Visit: Payer: Self-pay

## 2019-08-11 ENCOUNTER — Telehealth: Payer: Self-pay | Admitting: *Deleted

## 2019-08-11 ENCOUNTER — Encounter (HOSPITAL_COMMUNITY): Payer: Self-pay

## 2019-08-11 ENCOUNTER — Encounter (HOSPITAL_COMMUNITY)
Admission: RE | Admit: 2019-08-11 | Discharge: 2019-08-11 | Disposition: A | Discharge status: Home / Self Care | Payer: Medicare Other | Source: Ambulatory Visit | Attending: Orthopaedic Surgery | Admitting: Orthopaedic Surgery

## 2019-08-11 DIAGNOSIS — Z01818 Encounter for other preprocedural examination: Secondary | ICD-10-CM | POA: Insufficient documentation

## 2019-08-11 DIAGNOSIS — Z853 Personal history of malignant neoplasm of breast: Secondary | ICD-10-CM | POA: Insufficient documentation

## 2019-08-11 DIAGNOSIS — Z20822 Contact with and (suspected) exposure to covid-19: Secondary | ICD-10-CM | POA: Insufficient documentation

## 2019-08-11 DIAGNOSIS — I10 Essential (primary) hypertension: Secondary | ICD-10-CM | POA: Insufficient documentation

## 2019-08-11 DIAGNOSIS — M1712 Unilateral primary osteoarthritis, left knee: Secondary | ICD-10-CM | POA: Diagnosis not present

## 2019-08-11 DIAGNOSIS — E119 Type 2 diabetes mellitus without complications: Secondary | ICD-10-CM | POA: Insufficient documentation

## 2019-08-11 LAB — TYPE AND SCREEN
ABO/RH(D): O POS
Antibody Screen: NEGATIVE

## 2019-08-11 LAB — CBC WITH DIFFERENTIAL/PLATELET
Abs Immature Granulocytes: 0.01 10*3/uL (ref 0.00–0.07)
Basophils Absolute: 0 10*3/uL (ref 0.0–0.1)
Basophils Relative: 1 %
Eosinophils Absolute: 0.1 10*3/uL (ref 0.0–0.5)
Eosinophils Relative: 2 %
HCT: 35.6 % — ABNORMAL LOW (ref 36.0–46.0)
Hemoglobin: 11.2 g/dL — ABNORMAL LOW (ref 12.0–15.0)
Immature Granulocytes: 0 %
Lymphocytes Relative: 37 %
Lymphs Abs: 1.7 10*3/uL (ref 0.7–4.0)
MCH: 30.4 pg (ref 26.0–34.0)
MCHC: 31.5 g/dL (ref 30.0–36.0)
MCV: 96.7 fL (ref 80.0–100.0)
Monocytes Absolute: 0.2 10*3/uL (ref 0.1–1.0)
Monocytes Relative: 5 %
Neutro Abs: 2.6 10*3/uL (ref 1.7–7.7)
Neutrophils Relative %: 55 %
Platelets: 212 10*3/uL (ref 150–400)
RBC: 3.68 MIL/uL — ABNORMAL LOW (ref 3.87–5.11)
RDW: 15.8 % — ABNORMAL HIGH (ref 11.5–15.5)
WBC: 4.7 10*3/uL (ref 4.0–10.5)
nRBC: 0 % (ref 0.0–0.2)

## 2019-08-11 LAB — COMPREHENSIVE METABOLIC PANEL
ALT: 25 U/L (ref 0–44)
AST: 23 U/L (ref 15–41)
Albumin: 4.2 g/dL (ref 3.5–5.0)
Alkaline Phosphatase: 60 U/L (ref 38–126)
Anion gap: 13 (ref 5–15)
BUN: 17 mg/dL (ref 8–23)
CO2: 26 mmol/L (ref 22–32)
Calcium: 9.6 mg/dL (ref 8.9–10.3)
Chloride: 100 mmol/L (ref 98–111)
Creatinine, Ser: 0.94 mg/dL (ref 0.44–1.00)
GFR calc Af Amer: >60 mL/min (ref >60)
GFR calc non Af Amer: >60 mL/min (ref >60)
Glucose, Bld: 263 mg/dL — ABNORMAL HIGH (ref 70–99)
Potassium: 3.2 mmol/L — ABNORMAL LOW (ref 3.5–5.1)
Sodium: 139 mmol/L (ref 135–145)
Total Bilirubin: 0.7 mg/dL (ref 0.3–1.2)
Total Protein: 6.7 g/dL (ref 6.5–8.1)

## 2019-08-11 LAB — SURGICAL PCR SCREEN
MRSA, PCR: NEGATIVE
Staphylococcus aureus: NEGATIVE

## 2019-08-11 LAB — SARS CORONAVIRUS 2 (TAT 6-24 HRS): SARS Coronavirus 2: NEGATIVE

## 2019-08-11 LAB — PROTIME-INR
INR: 1 (ref 0.8–1.2)
Prothrombin Time: 13.4 seconds (ref 11.4–15.2)

## 2019-08-11 LAB — GLUCOSE, CAPILLARY: Glucose-Capillary: 213 mg/dL — ABNORMAL HIGH (ref 70–99)

## 2019-08-11 LAB — APTT: aPTT: 34 seconds (ref 24–36)

## 2019-08-11 NOTE — Care Plan (Signed)
RNCM call to patient to discuss her upcoming Left TKA with Dr. Erlinda Hong on Monday, 08/15/19. She is an Ortho bundle patient through [facility]. Reviewed this and answered questions related to CM, patient agreeable. Patient will be returning home and has help from her spouse. She will need CPM, FWW, and 3in1. Referral made to [employer] and expect these to be delivered to her home prior to surgery. Anticipate HHPT will be needed after a short hospital stay. Choice provided and referral made to [facility]. Patient agreeable to OPPT with [facility] when appropriate. CM will schedule with our office for PT at around 2 weeks post-op. 2 week Post-op appointment already scheduled. Answered all questions related to pre- and post-op. CM will continue to follow for needs.

## 2019-08-11 NOTE — Progress Notes (Signed)
A1C still unresulted called to main lab here at [facility] and found that the machine is down.  Sample sent out to [facility].

## 2019-08-11 NOTE — Progress Notes (Signed)
PCP - Dr. Ina Homes Cardiologist - Denies  PPM/ICD - Denies  Chest x-ray - 08/11/19 EKG - 08/11/19 Stress Test - Pt stated :"years ago during childbearing years and was negative" ECHO - 03/30/12 Cardiac Cath - Denies  Sleep Study - Denies  Fasting Blood Sugar - 120-145 Checks Blood Sugar _4____ times a week CBG 213 on PAT arrival. Pt stated she had eaten  Aspirin Instructions: Given  ERAS Protcol -Yes PRE-SURGERY 10 Water given  COVID TEST- 08/11/19  Anesthesia review: Yes review echo  Patient denies shortness of breath, fever, cough and chest pain at PAT appointment   All instructions explained to the patient, with a verbal understanding of the material. Patient agrees to go over the instructions while at home for a better understanding. Patient also instructed to self quarantine after being tested for COVID-19. The opportunity to ask questions was provided.

## 2019-08-11 NOTE — Telephone Encounter (Signed)
Ortho bundle pre-op call completed. 

## 2019-08-12 ENCOUNTER — Telehealth: Payer: Self-pay | Admitting: *Deleted

## 2019-08-12 ENCOUNTER — Encounter (HOSPITAL_COMMUNITY): Payer: Self-pay | Admitting: Physician Assistant

## 2019-08-12 ENCOUNTER — Other Ambulatory Visit: Payer: Self-pay | Admitting: *Deleted

## 2019-08-12 DIAGNOSIS — E785 Hyperlipidemia, unspecified: Secondary | ICD-10-CM

## 2019-08-12 DIAGNOSIS — C50911 Malignant neoplasm of unspecified site of right female breast: Secondary | ICD-10-CM | POA: Diagnosis not present

## 2019-08-12 LAB — HEMOGLOBIN A1C
Hgb A1c MFr Bld: 8.1 % — ABNORMAL HIGH (ref 4.8–5.6)
Mean Plasma Glucose: 186 mg/dL

## 2019-08-12 NOTE — Progress Notes (Signed)
Anesthesia Chart Review:  Hx of DMII on metformin. Preop A1c 8.1. Per Dr. Erlinda Hong, surgery to be postponed, A1c needs to be below 7.8.   Hx of breast CA s/p mastectomy and chemoradiation. Echo done in 2012 for chemo monitoring showed normal LVEF, full results below.   Remainder of preop labs unremarkable.  Case to be postponed until better glycemic control achieved.   TTE 10/29/10 (done for chemo monitoring): Study conclusions: Left ventricle: The cavity size is normal.  Wall thickness was increased in a pattern of mild LVH.  Systolic function was normal.  The estimated ejection fraction was in the range of 55 to 60%.  Wall motion was normal; there were no regional wall motion normalities.  Doppler parameters are consistent with abnormal left ventricular relaxation (grade 1 diastolic dysfunction).   Wynonia Musty [facility] Short Stay Center/Anesthesiology Phone [phone] 08/12/2019 9:06 AM

## 2019-08-12 NOTE — Progress Notes (Signed)
Need to postpone surgery.  A1C 8.1.  needs to be less than 7.8.  Thanks.

## 2019-08-12 NOTE — Care Plan (Signed)
Left TKA currently canceled per Dr. Erlinda Hong due to elevated A1C of 8.1 at pre-op labs yesterday, 08/11/19. Patient contacted and aware that RNCM will reach out to her PCP, Dr. Tarri Abernethy to notify of results. Will schedule repeat A1C upon recommendations of her PCP. Surgery scheduling after A1C of equal or less than 7.7 for [facility] hard stop purposes. Also made [facility] aware that delivery of post op DME should be held at this time.

## 2019-08-12 NOTE — Telephone Encounter (Signed)
Patient scheduled for Left-TKA on Monday, 08/15/19 with Dr. Erlinda Hong canceled due to A1C elevated to 8.1 at pre-op lab work. Wanted you to know for any recommendations or changes she may need to make. Also wanted your recommendation regarding when we should re-check her A1C to see if she qualifies for surgery? A1C has to be at or below 7.7 to proceed with joint replacement per our "hardstop". Call with questions or recommendations- Jamse Arn, RN, BSN, [place] [phone]. Thanks.

## 2019-08-12 NOTE — Progress Notes (Signed)
Canceled surgery.  Sheri to call patient to discuss.

## 2019-08-12 NOTE — Telephone Encounter (Signed)
Ortho bundle call completed to discuss canceling of surgery scheduled for Monday, 08/15/19 due to elevated A1C.

## 2019-08-15 ENCOUNTER — Ambulatory Visit (HOSPITAL_COMMUNITY): Admission: RE | Admit: 2019-08-15 | Payer: Medicare Other | Source: Home / Self Care | Admitting: Orthopaedic Surgery

## 2019-08-15 ENCOUNTER — Encounter (HOSPITAL_COMMUNITY): Admission: RE | Payer: Self-pay | Source: Home / Self Care

## 2019-08-15 SURGERY — ARTHROPLASTY, KNEE, TOTAL
Anesthesia: Spinal | Site: Knee | Laterality: Left

## 2019-08-15 MED ORDER — ATORVASTATIN CALCIUM 40 MG PO TABS: 40.0000 mg | ORAL_TABLET | Freq: Every day | ORAL | 1 refills | Status: DC

## 2019-08-15 NOTE — Telephone Encounter (Signed)
She can have it rechecked in a couple weeks to see if it was spurious. Otherwise it will be modify her diet and exercise. Possibly increase metformin to twice daily. Thanks.

## 2019-08-16 NOTE — Telephone Encounter (Signed)
I will contact her and update her on those recommendations. Thank you.

## 2019-08-22 ENCOUNTER — Encounter: Payer: Self-pay | Admitting: *Deleted

## 2019-08-30 ENCOUNTER — Inpatient Hospital Stay: Payer: Medicare Other | Admitting: Orthopaedic Surgery

## 2019-09-08 ENCOUNTER — Encounter: Payer: Self-pay | Admitting: *Deleted

## 2019-09-15 ENCOUNTER — Encounter: Payer: Medicare Other | Admitting: Internal Medicine

## 2019-10-27 ENCOUNTER — Encounter: Payer: Self-pay | Admitting: Internal Medicine

## 2019-10-27 ENCOUNTER — Ambulatory Visit (INDEPENDENT_AMBULATORY_CARE_PROVIDER_SITE_OTHER): Payer: Medicare HMO | Admitting: Internal Medicine

## 2019-10-27 VITALS — BP 128/68 | HR 88 | Temp 97.9°F | Ht 59.0 in | Wt 150.0 lb

## 2019-10-27 DIAGNOSIS — E1159 Type 2 diabetes mellitus with other circulatory complications: Secondary | ICD-10-CM

## 2019-10-27 DIAGNOSIS — I10 Essential (primary) hypertension: Secondary | ICD-10-CM

## 2019-10-27 DIAGNOSIS — E1121 Type 2 diabetes mellitus with diabetic nephropathy: Secondary | ICD-10-CM | POA: Diagnosis not present

## 2019-10-27 DIAGNOSIS — E1129 Type 2 diabetes mellitus with other diabetic kidney complication: Secondary | ICD-10-CM

## 2019-10-27 DIAGNOSIS — I152 Hypertension secondary to endocrine disorders: Secondary | ICD-10-CM

## 2019-10-27 LAB — GLUCOSE, CAPILLARY: Glucose-Capillary: 168 mg/dL — ABNORMAL HIGH (ref 70–99)

## 2019-10-27 LAB — POCT GLYCOSYLATED HEMOGLOBIN (HGB A1C): Hemoglobin A1C: 7.5 % — AB (ref 4.0–5.6)

## 2019-10-27 NOTE — Patient Instructions (Signed)
Thank you for allowing me to provide your care for the last three years. We wait to see which her A1c returns at and if it is still above 8.0 we will restart glipizide 2.5 mg daily. I would like you to come back in three months to meet your new doctor.

## 2019-10-27 NOTE — Progress Notes (Signed)
   CC: DM, HTN  HPI:  [name] is a 74 y.o. female with PMHx listed below presenting for DM, HTN. Please see the A&P for the status of the patient's chronic medical problems.  Past Medical History:  Diagnosis Date  . Allergy   . Anemia   . Breast CA ([place])    (Rt) breast ca dx 4/12---  S/P RADICAL MASTECTOMY AND CHEMORADIATION  . Diabetes mellitus ORAL MED  . Fatty liver disease, nonalcoholic   . History of hyperkalemia   . Hx antineoplastic chemo  10/12 - 06/24/11   PACLITAX EL COMPLETED 06/24/11  . Hyperlipidemia   . Hypertension   . Mild nonproliferative diabetic retinopathy of right eye ([place]) 03/19/11   Dr. Joseph Art  . Numbness and tingling    Hx: of in fingers and toes since chemotherapy  . OA (osteoarthritis) of knee    RIGHT LEG  . Obesity   . Renal lesion    Review of Systems:  Performed and all others negative.  Physical Exam: Vitals:   10/27/19 1309  BP: 128/68  Pulse: 88  Temp: 97.9 F (36.6 C)  TempSrc: Oral  SpO2: 100%  Weight: 150 lb (68 kg)  Height: 4\' 11"  (1.499 m)   General: Well nourished female in no acute distress Pulm: Good air movement with no wheezing or crackles  CV: RRR, no murmurs, no rubs   Assessment & Plan:   See Encounters Tab for problem based charting.  Patient discussed with Dr. Rebeca Alert

## 2019-10-27 NOTE — Assessment & Plan Note (Signed)
Patient with well-controlled hypertension. She is currently on chlorthalidone 25 mg daily and lisinopril 20 mg daily. She has been taking these medications without any apparent side effects. Reviewed her last BMP which showed normal renal function.   Currently well controlled. Will continue chlorthalidone 25 mg daily and lisinopril 20 mg daily. Repeat BMP today.

## 2019-10-27 NOTE — Assessment & Plan Note (Addendum)
Patient with previously well controlled diabetes mellitus. She is on metformin 500 mg once daily. She is unable to go up on this due to G.I. side effects. She was scheduled for left knee arthroplasty however this was canceled because her A1c came back at 8.1. Repeat today at 32.   She does drink a lot of Pepsi. We discussed cutting back on her intake. She voices understanding.  Plan is to continue metformin 500 mg once daily. Patient will start to decrease her Pepsi intake.

## 2019-10-28 LAB — BMP8+ANION GAP
Anion Gap: 18 mmol/L (ref 10.0–18.0)
BUN/Creatinine Ratio: 19 (ref 12–28)
BUN: 17 mg/dL (ref 8–27)
CO2: 25 mmol/L (ref 20–29)
Calcium: 9.6 mg/dL (ref 8.7–10.3)
Chloride: 98 mmol/L (ref 96–106)
Creatinine, Ser: 0.91 mg/dL (ref 0.57–1.00)
GFR calc Af Amer: 73 mL/min/{1.73_m2} (ref >59)
GFR calc non Af Amer: 63 mL/min/{1.73_m2} (ref >59)
Glucose: 169 mg/dL — ABNORMAL HIGH (ref 65–99)
Potassium: 3.6 mmol/L (ref 3.5–5.2)
Sodium: 141 mmol/L (ref 134–144)

## 2019-10-28 NOTE — Progress Notes (Signed)
Internal Medicine Clinic Attending  Case discussed with Dr. Helberg at the time of the visit.  We reviewed the resident's history and exam and pertinent patient test results.  I agree with the assessment, diagnosis, and plan of care documented in the resident's note.  Dillian Feig, M.D., Ph.D.  

## 2019-11-09 ENCOUNTER — Other Ambulatory Visit: Payer: Self-pay | Admitting: Student

## 2019-11-09 DIAGNOSIS — I152 Hypertension secondary to endocrine disorders: Secondary | ICD-10-CM

## 2019-11-09 DIAGNOSIS — E1121 Type 2 diabetes mellitus with diabetic nephropathy: Secondary | ICD-10-CM

## 2019-11-09 DIAGNOSIS — E1159 Type 2 diabetes mellitus with other circulatory complications: Secondary | ICD-10-CM

## 2019-11-09 MED ORDER — LISINOPRIL 20 MG PO TABS: 20.0000 mg | ORAL_TABLET | Freq: Every day | ORAL | 1 refills | Status: DC

## 2019-11-09 NOTE — Telephone Encounter (Signed)
Refill Request-  Patient has now changed her pharmacy and ins.  Please send the following medication to   [facility] Address: [address] [place], [place] [address] Phone: [phone]    lisinopril (ZESTRIL) 20 MG tablet

## 2019-11-24 ENCOUNTER — Other Ambulatory Visit: Payer: Self-pay | Admitting: *Deleted

## 2019-11-24 DIAGNOSIS — E785 Hyperlipidemia, unspecified: Secondary | ICD-10-CM

## 2019-11-24 MED ORDER — ATORVASTATIN CALCIUM 40 MG PO TABS: 40.0000 mg | ORAL_TABLET | Freq: Every day | ORAL | 1 refills | Status: DC

## 2019-11-24 NOTE — Telephone Encounter (Signed)
Last refilled 08/15/19, [facility]

## 2019-12-12 ENCOUNTER — Other Ambulatory Visit: Payer: Self-pay

## 2019-12-12 ENCOUNTER — Telehealth: Payer: Self-pay | Admitting: Dietician

## 2019-12-12 ENCOUNTER — Encounter: Payer: Self-pay | Admitting: Student

## 2019-12-12 ENCOUNTER — Ambulatory Visit (INDEPENDENT_AMBULATORY_CARE_PROVIDER_SITE_OTHER): Payer: Medicare HMO | Admitting: Student

## 2019-12-12 ENCOUNTER — Encounter: Payer: Self-pay | Admitting: Dietician

## 2019-12-12 VITALS — BP 108/58 | HR 86 | Temp 98.3°F | Ht 59.0 in | Wt 153.4 lb

## 2019-12-12 DIAGNOSIS — G6289 Other specified polyneuropathies: Secondary | ICD-10-CM

## 2019-12-12 DIAGNOSIS — E1159 Type 2 diabetes mellitus with other circulatory complications: Secondary | ICD-10-CM | POA: Diagnosis not present

## 2019-12-12 DIAGNOSIS — Z0000 Encounter for general adult medical examination without abnormal findings: Secondary | ICD-10-CM | POA: Diagnosis not present

## 2019-12-12 DIAGNOSIS — F329 Major depressive disorder, single episode, unspecified: Secondary | ICD-10-CM

## 2019-12-12 DIAGNOSIS — E1121 Type 2 diabetes mellitus with diabetic nephropathy: Secondary | ICD-10-CM

## 2019-12-12 DIAGNOSIS — I10 Essential (primary) hypertension: Secondary | ICD-10-CM

## 2019-12-12 DIAGNOSIS — R69 Illness, unspecified: Secondary | ICD-10-CM | POA: Diagnosis not present

## 2019-12-12 DIAGNOSIS — Z23 Encounter for immunization: Secondary | ICD-10-CM

## 2019-12-12 DIAGNOSIS — R4589 Other symptoms and signs involving emotional state: Secondary | ICD-10-CM

## 2019-12-12 DIAGNOSIS — I152 Hypertension secondary to endocrine disorders: Secondary | ICD-10-CM

## 2019-12-12 LAB — POCT GLYCOSYLATED HEMOGLOBIN (HGB A1C): Hemoglobin A1C: 7.9 % — AB (ref 4.0–5.6)

## 2019-12-12 LAB — GLUCOSE, CAPILLARY: Glucose-Capillary: 262 mg/dL — ABNORMAL HIGH (ref 70–99)

## 2019-12-12 MED ORDER — CHLORTHALIDONE 25 MG PO TABS: 25.0000 mg | ORAL_TABLET | Freq: Every day | ORAL | 1 refills | Status: DC

## 2019-12-12 MED ORDER — JANUMET XR 50-500 MG PO TB24: 1.0000 | ORAL_TABLET | Freq: Every morning | ORAL | 5 refills | Status: DC

## 2019-12-12 NOTE — Telephone Encounter (Signed)
Referral was made for [name] to go to [facility] in January. They could not get in touch with her. Will request that she call them to schedule.

## 2019-12-12 NOTE — Progress Notes (Signed)
Internal Medicine Clinic Attending  I saw and evaluated the patient.  I personally confirmed the key portions of the history and exam documented by Dr. [name] and I reviewed pertinent patient test results.  The assessment, diagnosis, and plan were formulated together and I agree with the documentation in the resident's note.  

## 2019-12-12 NOTE — Progress Notes (Addendum)
CC: New PCP Appointment - HTN, Diabetes management   HPI:  [name] is a 74 y.o. with a PMHx of Type II DM, HTN, HLD, Right BC 4/12 in remission who presents to meet her new PCP. We discussed her health goals, and [name] notes that her overall goal is to stop all of her medications. We then discussed how improving her diet has been going and she notes that she has been attempting to cut back on the amount of pepsi's she drinks, but has been unable to find an alternative. She states she originally started drinking them when she was going through chemotherapy to keep her from being hypoglycemic and she has been drinking them ever since. She notes drinking one a day. She has tried zero [employer], but has been unable to find them in the stores. She has tried sparkling waters, but did not enjoy drinking them. She notes buying bottled water has increased her intake. Congratulated her on working hard to improve her diet and offered recommendations of using sugar free water flavoring additivities, diet sodas/fruit drinks.   She also states she was scheduled to have a left knee replacement 08/2019, however, it was postponed until her A1C is less than 7.8. Her last A1C in the office was 7.5. She notes that she has been unable to tolerate her normal exercise routine of walking due to increasing left knee pain. She notes she will try to start going back to the [facility] and joining the water aerobics class.  [name] also asked about her recent nose bleeds, she notes that at first they are hard to stop, but stop over time. She denies any history of bleeding disorders in her family. She noticed that some days she skips her prescribed nasal spray and that it can dry out her nose.   She also notes numbness in tingling in her hands and feet that she cannot tell if it is worsening. She notes being on gabapentin in the past, but would not like to be restarted for now and notes that she will contact  us if it worsens, otherwise we will discuss during her next visit.   She has no other complaints at this time.   Past Medical History:  Diagnosis Date  . Allergy   . Anemia   . Breast CA ([place])    (Rt) breast ca dx 4/12---  S/P RADICAL MASTECTOMY AND CHEMORADIATION  . Diabetes mellitus ORAL MED  . Fatty liver disease, nonalcoholic   . History of hyperkalemia   . Hx antineoplastic chemo  10/12 - 06/24/11   PACLITAX EL COMPLETED 06/24/11  . Hyperlipidemia   . Hypertension   . Mild nonproliferative diabetic retinopathy of right eye ([place]) 03/19/11   Dr. Joseph Art  . Numbness and tingling    Hx: of in fingers and toes since chemotherapy  . OA (osteoarthritis) of knee    RIGHT LEG  . Obesity   . Renal lesion    Review of Systems:  Review of Systems  Gastrointestinal: Negative for abdominal pain, constipation and diarrhea.  Musculoskeletal:       Left knee pain  Neurological: Positive for tingling (hands).       Numbness: feet bilaterally  All other systems reviewed and are negative.    Physical Exam:  Vitals:   12/12/19 1316  BP: (!) 141/79  Pulse: (!) 103  Temp: 98.3 F (36.8 C)  TempSrc: Oral  SpO2: 99%  Weight: 153 lb 6.4 oz (69.6 kg)  Height: 4\' 11"  (1.499 m)   Physical Exam Vitals and nursing note reviewed.  Constitutional:      General: She is not in acute distress.    Appearance: Normal appearance. She is not ill-appearing, toxic-appearing or diaphoretic.  HENT:     Head: Normocephalic and atraumatic.  Eyes:     Extraocular Movements: Extraocular movements intact.  Cardiovascular:     Rate and Rhythm: Normal rate and regular rhythm.     Pulses: Normal pulses.     Heart sounds: Normal heart sounds. No murmur heard.  No gallop.   Pulmonary:     Effort: No respiratory distress.     Breath sounds: Normal breath sounds. No stridor. No wheezing, rhonchi or rales.  Musculoskeletal:     Cervical back: Normal range of motion.  Skin:    General: Skin is  warm and dry.  Neurological:     General: No focal deficit present.     Mental Status: She is alert.  Psychiatric:        Mood and Affect: Mood normal.        Behavior: Behavior normal.     Assessment & Plan:   See Encounters Tab for problem based charting.  Patient discussed with and seen by Dr. Heber [place]

## 2019-12-12 NOTE — Assessment & Plan Note (Signed)
Foot Exam performed by RN today, unremarkable. Patient notes continuing to have tingling in her hands and numbness in her feet. Will continue to monitor on next visit. Patient states she would not like to start gabapentin regimen again.   A/P:  - Continue to monitor and adjust medications as needed.

## 2019-12-12 NOTE — Assessment & Plan Note (Addendum)
Today we had an extensive discussion about how I can help [name] in decreasing her intake of sodas. We discussed alternatives such as diet sodas/zero sugar/calorie drinks, diet juices, zero sugar water flavoring packets, and sparkling water. Also discussed switching to multigrain/whole grain breads and using olive oil instead of butter.   She notes her health goal is to decrease her medications and not increase the amount she is currently on.   Therefore, Dr. Heber [place] and I believe it would be best to not add any medications that would increase the number of pills she takes in a day. In the past she has had yeast infections with SGLT-2's, had GI side effects on 1000mg  metformin, and had episodes of hypoglycemia with glipizide.   She is close to her A1C goal and needs to be below 7.8 (per surgeons note) to have her left knee replacement. A prescription was written for Janumet XR 50-500 and if the medication is unaffordable, the patient will focus on her diet. She agrees with this plan.   Her foot examination was unremarkable. Offered patient [name]'s services to go over diabetic diet, however, patient states she knows what she needs to do.   A/P: - Continue metformin XR 500mg  - If Janumet XR 50-500 is affordable, take that and discontinue metformin XR 500 mg.  - If Janumet is unaffordable, patient will focus on diet, exercising more, and continuing her metformin dose.  - Continue to work on diet and decreasing sodas

## 2019-12-12 NOTE — Patient Instructions (Addendum)
Thank you for allowing [place] to assist in your care today, it was great to meet you!   Today we discussed:   1.) Diabetes - We talked in detail about the need to limit the amount of Pepsi's you drink in order to to make you eligible for your surgery as well as decrease the amount of medication you are on. You are working hard, keep up the good work and it will pay off! Please reach out if you feel as though you need more support in achieving your goal.  - We are going to write you a prescription for janumet. If it is affordable, please stop taking your metformin - If it is unaffordable, please continue to work on your diet and continue the metformin.  2.) Health Maintenance - We have put in a referral for your colonoscopy.  - You received your TDAP booster today.   3.) Your health goals - We talked about achieving your health goals today, one of which was decreasing your medications. Continue to work on [employer], exercise as much as you can with your knee pain, and please let us know how we can support you!   If at anytime you have any questions or concerns, please call our office.

## 2019-12-12 NOTE — Assessment & Plan Note (Signed)
Patient's blood pressure today 108/42. Well controlled on chlorthalidone 25 mg daily and lisinopril 20 mg daily. She states she takes the medications as prescribed and has no complaints.   Plan: Continue current regimen of chlorthalidone and lisinopril.  Will continue to monitor and adjust medications as need be.

## 2019-12-12 NOTE — Assessment & Plan Note (Signed)
Patient notes good support system at home and feels as though her mental health is doing well at this time.

## 2019-12-12 NOTE — Assessment & Plan Note (Signed)
Foot Exam Performed Today Colonoscopy referral placed TDAP Booster Given Patient given information to schedule eye exam.

## 2019-12-16 ENCOUNTER — Encounter: Payer: Self-pay | Admitting: Genetic Counselor

## 2020-01-18 ENCOUNTER — Other Ambulatory Visit: Payer: Self-pay

## 2020-01-18 DIAGNOSIS — E1121 Type 2 diabetes mellitus with diabetic nephropathy: Secondary | ICD-10-CM

## 2020-01-18 NOTE — Telephone Encounter (Signed)
Pt states the pharmacy do not have metFORMIN (GLUCOPHAGE-XR) 500 MG 24 hr tablet on the file for her. Please call pt back.

## 2020-01-18 NOTE — Telephone Encounter (Signed)
Attempted call back to home x2 and cell phone without response. Left VM.

## 2020-01-18 NOTE — Telephone Encounter (Signed)
Patient was prescribed Janumet during 12/2019 visit. Patient was to switch to Janumet if affordable. Otherwise, she was going to stick with metformin. Will call again for clarification on which medication she is taking prior to prescribing.

## 2020-01-19 ENCOUNTER — Telehealth: Payer: Self-pay | Admitting: Student

## 2020-01-19 MED ORDER — METFORMIN HCL ER 500 MG PO TB24: 500.0000 mg | ORAL_TABLET | Freq: Every day | ORAL | 3 refills | Status: DC

## 2020-01-19 NOTE — Telephone Encounter (Signed)
Patient returned call from clinic, reports she is not taking Janumet. Will re-order metformin 500mg  BID.

## 2020-01-19 NOTE — Addendum Note (Signed)
Addended bySanjuan Dame on: 01/19/2020 03:23 PM   Modules accepted: Orders

## 2020-01-19 NOTE — Telephone Encounter (Signed)
Called again 9/16, left VM. Will hold on prescribing until we can confirm with patient which medication she is taking.

## 2020-01-26 ENCOUNTER — Other Ambulatory Visit: Payer: Self-pay

## 2020-01-26 NOTE — Telephone Encounter (Signed)
RX was sent by Dr. Collene Gobble. SChaplin, RN,BSN

## 2020-01-26 NOTE — Telephone Encounter (Signed)
metFORMIN (GLUCOPHAGE-XR) 500 MG 24 hr tablet, REFILL REQUEST @  [id number] [place], [place] - [address] AT [place] Phone:  [phone]  Fax:  [phone]

## 2020-02-20 ENCOUNTER — Other Ambulatory Visit: Payer: Self-pay | Admitting: Oncology

## 2020-02-20 DIAGNOSIS — Z1231 Encounter for screening mammogram for malignant neoplasm of breast: Secondary | ICD-10-CM

## 2020-02-21 LAB — HM DIABETES EYE EXAM

## 2020-03-02 LAB — HM DIABETES EYE EXAM

## 2020-03-20 ENCOUNTER — Telehealth: Payer: Self-pay | Admitting: Dietician

## 2020-03-20 ENCOUNTER — Encounter: Payer: Self-pay | Admitting: Dietician

## 2020-03-20 ENCOUNTER — Encounter: Payer: Self-pay | Admitting: Student

## 2020-03-20 ENCOUNTER — Other Ambulatory Visit: Payer: Self-pay

## 2020-03-20 ENCOUNTER — Ambulatory Visit (INDEPENDENT_AMBULATORY_CARE_PROVIDER_SITE_OTHER): Payer: Medicare HMO | Admitting: Student

## 2020-03-20 VITALS — BP 138/66 | HR 86 | Temp 97.6°F | Ht 59.0 in | Wt 152.1 lb

## 2020-03-20 DIAGNOSIS — E1121 Type 2 diabetes mellitus with diabetic nephropathy: Secondary | ICD-10-CM | POA: Diagnosis not present

## 2020-03-20 DIAGNOSIS — M1712 Unilateral primary osteoarthritis, left knee: Secondary | ICD-10-CM

## 2020-03-20 LAB — GLUCOSE, CAPILLARY: Glucose-Capillary: 200 mg/dL — ABNORMAL HIGH (ref 70–99)

## 2020-03-20 LAB — POCT GLYCOSYLATED HEMOGLOBIN (HGB A1C): Hemoglobin A1C: 8.5 % — AB (ref 4.0–5.6)

## 2020-03-20 MED ORDER — METFORMIN HCL ER 500 MG PO TB24: 1000.0000 mg | ORAL_TABLET | Freq: Every day | ORAL | 3 refills | Status: DC

## 2020-03-20 MED ORDER — DAPAGLIFLOZIN PROPANEDIOL 5 MG PO TABS: 5.0000 mg | ORAL_TABLET | Freq: Every day | ORAL | 3 refills | Status: DC

## 2020-03-20 NOTE — Telephone Encounter (Signed)
[name] says she has been to [facility]. Called for report.Report astracted into Epic.

## 2020-03-20 NOTE — Assessment & Plan Note (Addendum)
Assessment: Patient presented to clinic today for follow up appointment for her diabetes.  Lab Results  Component Value Date   HGBA1C 8.5 (A) 03/20/2020   A1c elevated from prior visit. Patient notes she knows what she needs to do in terms of her diet but has a difficult time. She drinks one soda daily which she states has decreased since prior visit. She struggles mostly with biscuits and breads. I recommended alternatives if she is unable to decrease her carb intake from breads such as ezekial bread or Dave's bread. She notes at times she feels nauseated and does not feel like eating meals so she will drink a soda instead.   Patient appeared discouraged but I tried to encourage her to keep up the hard work and focus on the main goal at this time which is getting her left knee replaced. Goal A1c per ortho is 7.8.   In the past patient had increased episodes of diarrhea when on janumet in the past, she had a yeast infection and her SGLT-2 was discontinued at that time.   I went over the potential side effects of SGLT-2 and will start patient on farxiga, yeast infection is not an absolute contraindication and I believe patient could truly benefit from the addition of an SGLT-2. Will also increase metformin, patient instructed to call office if she has episodes of diarrhea.   Plan: Start farxiga 5 mg Increase metformin to 1000 mg xr Recheck A1c in 3 months

## 2020-03-20 NOTE — Patient Instructions (Signed)
Thank you, Ms.Taunia A Costabile for allowing [place] to provide your care today. Today we discussed your diabtes. We will add a medication called Farxiga. Please pick this up from the pharmacy and call us if there are any complications obtaining this medicine. Please continue to work on your diet! I know it is difficult around the holidays, but try your best!     I have ordered the following labs for you:   Lab Orders     Glucose, capillary     POC Hbg A1C    Referrals ordered today:   Referral Orders  No referral(s) requested today     I have ordered the following medication/changed the following medications:   Stop the following medications: Medications Discontinued During This Encounter  Medication Reason  . metFORMIN (GLUCOPHAGE-XR) 500 MG 24 hr tablet Dose change     Start the following medications: Meds ordered this encounter  Medications  . dapagliflozin propanediol (FARXIGA) 5 MG TABS tablet    Sig: Take 1 tablet (5 mg total) by mouth daily.    Dispense:  30 tablet    Refill:  3  . metFORMIN (GLUCOPHAGE-XR) 500 MG 24 hr tablet    Sig: Take 2 tablets (1,000 mg total) by mouth daily with breakfast.    Dispense:  90 tablet    Refill:  3     Follow up: 3 months    Remember: Continue to do your best!   Should you have any questions or concerns please call the internal medicine clinic at [phone].     Sanjuana Letters, D.O. [place]

## 2020-03-20 NOTE — Progress Notes (Signed)
CC: High blood sugar  HPI:  [name] is a 74 y.o. female with a past medical history stated below and presents today for diabetes follow up and managment. Please see problem based assessment and plan for additional details.  Past Medical History:  Diagnosis Date  . Allergy   . Anemia   . Breast CA ([place])    (Rt) breast ca dx 4/12---  S/P RADICAL MASTECTOMY AND CHEMORADIATION  . Diabetes mellitus ORAL MED  . Fatty liver disease, nonalcoholic   . History of hyperkalemia   . Hx antineoplastic chemo  10/12 - 06/24/11   PACLITAX EL COMPLETED 06/24/11  . Hyperlipidemia   . Hypertension   . Mild nonproliferative diabetic retinopathy of right eye ([place]) 03/19/11   Dr. Joseph Art  . Numbness and tingling    Hx: of in fingers and toes since chemotherapy  . OA (osteoarthritis) of knee    RIGHT LEG  . Obesity   . Renal lesion     Current Outpatient Medications on File Prior to Visit  Medication Sig Dispense Refill  . Accu-Chek Softclix Lancets lancets Check blood sugar up to three times daily. 300 each 3  . atorvastatin (LIPITOR) 40 MG tablet Take 1 tablet (40 mg total) by mouth at bedtime. 90 tablet 1  . chlorthalidone (HYGROTON) 25 MG tablet Take 1 tablet (25 mg total) by mouth daily. 90 tablet 1  . fluticasone (FLONASE) 50 MCG/ACT nasal spray Place 2 sprays into both nostrils daily. (Patient taking differently: Place 2 sprays into both nostrils daily as needed (allergies.). ) 16 g 0  . glucose blood (ACCU-CHEK GUIDE) test strip Check blood sugar up to three times daily. 300 each 3  . HYDROcodone-acetaminophen (NORCO) 5-325 MG tablet Take 1-2 tablets by mouth 3 (three) times daily as needed for moderate pain. (Patient not taking: Reported on 08/08/2019) 30 tablet 0  . lisinopril (ZESTRIL) 20 MG tablet Take 1 tablet (20 mg total) by mouth daily. 90 tablet 1  . Magnesium 250 MG TABS Take 250 mg by mouth 3 (three) times a week.    . metFORMIN (GLUCOPHAGE-XR) 500 MG 24 hr  tablet Take 1 tablet (500 mg total) by mouth daily with breakfast. 90 tablet 3  . mirtazapine (REMERON) 15 MG tablet Take 1 tablet (15 mg total) by mouth at bedtime. (Patient not taking: Reported on 08/08/2019) 30 tablet 0  . Polyethyl Glycol-Propyl Glycol 0.4-0.3 % SOLN Place 1 drop into both eyes 3 (three) times daily as needed (dry/irritated eyes.).    Marland Kitchen Potassium 99 MG TABS Take 99 mg by mouth 3 (three) times a week.    . traMADol (ULTRAM) 50 MG tablet Take 1 tablet (50 mg total) by mouth every 12 (twelve) hours as needed for severe pain. (Patient not taking: Reported on 08/08/2019) 10 tablet 0   No current facility-administered medications on file prior to visit.    Family History  Problem Relation Age of Onset  . Stroke Father   . Heart disease Sister   . Cancer Sister        breast  . Heart disease Brother   . Cancer Sister        COLON CANCER    Social History   Socioeconomic History  . Marital status: Married    Spouse name: Not on file  . Number of children: Not on file  . Years of education: 75  . Highest education level: Not on file  Occupational History  . Not on  file  Tobacco Use  . Smoking status: Never Smoker  . Smokeless tobacco: Never Used  Substance and Sexual Activity  . Alcohol use: No    Alcohol/week: 0.0 standard drinks  . Drug use: No    Types: Flunitrazepam  . Sexual activity: Not on file    Comment: MENARCHE AGE 15, G4, P4, PARITY AGE 36, HRT 20+ YEARS    Other Topics Concern  . 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  Patient needs to submit further paperwork to complete- Bonna Gains 08/14/09.   Financial assistance application completed.  Patient does not qualify for [place] 09/12/09.            Social Determinants of Health   Financial Resource  Strain:   . Difficulty of Paying Living Expenses: Not on file  Food Insecurity:   . Worried About Charity fundraiser in the Last Year: Not on file  . Ran Out of Food in the Last Year: Not on file  Transportation Needs:   . Lack of Transportation (Medical): Not on file  . Lack of Transportation (Non-Medical): Not on file  Physical Activity:   . Days of Exercise per Week: Not on file  . Minutes of Exercise per Session: Not on file  Stress:   . Feeling of Stress : Not on file  Social Connections:   . Frequency of Communication with Friends and Family: Not on file  . Frequency of Social Gatherings with Friends and Family: Not on file  . Attends Religious Services: Not on file  . Active Member of Clubs or Organizations: Not on file  . Attends Archivist Meetings: Not on file  . Marital Status: Not on file  Intimate Partner Violence:   . Fear of Current or Ex-Partner: Not on file  . Emotionally Abused: Not on file  . Physically Abused: Not on file  . Sexually Abused: Not on file    Review of Systems: ROS negative except for what is noted on the assessment and plan.  Vitals:   03/20/20 0934  BP: 138/66  Pulse: 86  Temp: 97.6 F (36.4 C)  TempSrc: Oral  SpO2: 100%  Weight: 152 lb 1.6 oz (69 kg)  Height: 4\' 11"  (1.499 m)     Physical Exam: Physical Exam Vitals and nursing note reviewed.  Constitutional:      Appearance: Normal appearance.  HENT:     Head: Normocephalic and atraumatic.  Cardiovascular:     Rate and Rhythm: Normal rate and regular rhythm.     Pulses: Normal pulses.     Heart sounds: Murmur (2/6 early systolic murmur) heard.   Pulmonary:     Effort: Pulmonary effort is normal. No respiratory distress.     Breath sounds: No stridor. No wheezing, rhonchi or rales.  Skin:    General: Skin is warm and dry.  Neurological:     General: No focal deficit present.     Mental Status: She is alert and oriented to person, place, and time. Mental  status is at baseline.  Psychiatric:        Mood and Affect: Mood normal.        Behavior: Behavior normal.      Assessment & Plan:   See Encounters Tab for problem  based charting.  Patient seen with Dr. Laurena Slimmer, D.O. [facility], PGY-1 Pager: [phone], Phone: [phone] Date 03/20/2020 Time 9:58 AM

## 2020-03-20 NOTE — Assessment & Plan Note (Signed)
Assessment: Patient followed by orthopedics. Procedure was planned for left knee replacement in the past, but patient's A1c was elected. She notes she feels as though she is having increasing pain in the left knee and that her ability to exercise is decreasing due to feelings of instability. The patient enquired about a brace for her knee, I explained braces are more for stability and ligament damage. I further stated if she feels as though it will help, she could try an OTC knee sleeve.   Plan: Work on A1c goal Advised patient to follow up with orthopedics Purchase knee sleeve or brace if she feels as though it will help

## 2020-03-21 NOTE — Progress Notes (Signed)
Internal Medicine Clinic Attending  I saw and evaluated the patient.  I personally confirmed the key portions of the history and exam documented by Dr. [name] and I reviewed pertinent patient test results.  The assessment, diagnosis, and plan were formulated together and I agree with the documentation in the resident's note.  

## 2020-03-22 ENCOUNTER — Telehealth: Payer: Self-pay | Admitting: *Deleted

## 2020-03-22 ENCOUNTER — Telehealth: Payer: Self-pay

## 2020-03-22 NOTE — Telephone Encounter (Signed)
I can try to write for another SGLT-2 and see if that is covered, but would appreciate input from Dr. Georgina Peer or Butch Penny on this. Thank you!

## 2020-03-22 NOTE — Telephone Encounter (Signed)
I called patient and advised since A1C has gone up to 8.5 we cannot schedule TKA.  She stated they changed her medication and we hope that will help.  I told her to follow up with me when it's checked again. She still wants to go forward with TKA when A1C allows.

## 2020-03-22 NOTE — Telephone Encounter (Signed)
Pt calls and states copay for farxiga is > 250.00. will send to kelly and donna and dr Georgina Peer along w/ dr Johnney Ou. Hopefully imc can help pt.

## 2020-03-23 ENCOUNTER — Encounter: Payer: Self-pay | Admitting: Dietician

## 2020-03-23 ENCOUNTER — Other Ambulatory Visit: Payer: Self-pay

## 2020-03-23 ENCOUNTER — Ambulatory Visit
Admission: RE | Admit: 2020-03-23 | Discharge: 2020-03-23 | Disposition: A | Discharge status: Home / Self Care | Payer: Medicare HMO | Source: Ambulatory Visit | Attending: Oncology | Admitting: Oncology

## 2020-03-23 ENCOUNTER — Other Ambulatory Visit: Payer: Self-pay | Admitting: Student

## 2020-03-23 DIAGNOSIS — Z1231 Encounter for screening mammogram for malignant neoplasm of breast: Secondary | ICD-10-CM

## 2020-03-23 MED ORDER — EMPAGLIFLOZIN 10 MG PO TABS: 10.0000 mg | ORAL_TABLET | Freq: Every day | ORAL | 4 refills | Status: DC

## 2020-03-23 NOTE — Telephone Encounter (Signed)
14 day free trial card faxed to pharmacy. I confirmed that it went through. Samples in sample room

## 2020-03-23 NOTE — Telephone Encounter (Signed)
Spoke with patient. She understands that Wilder Glade has been changed to [place] and to pick up 14 trial of [employer]. She requested pharm tech's (who can help her with patient assistance) contact information.

## 2020-03-23 NOTE — Telephone Encounter (Signed)
Message from [place], [place] tech:  Wilder Glade always has the free trial coupon if patient hasn't used it already.  Jardiance ran thru ins as a $47 copay for me...Marland KitchenMarland KitchenI don't know if that is her actually copay though because the farxiga is billed at another pharmacy and may be going towards deductible/out of pocket amount."  I am happy to send either the 30 day free trial for [place] or 14 day free trial for Jardiance. We have samples of Jardiance if needed, but not [place]. Her copays are likely to change as of 05/05/2020.

## 2020-03-23 NOTE — Telephone Encounter (Signed)
Prescription placed for jardiance 10 mg.

## 2020-03-23 NOTE — Progress Notes (Signed)
Patient unable to afford co-pay for farxiga, will switch to jardiance 10 mg. It appears her co-pay would be $45 for jardiance. 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 Butch Penny will work with patient to receive 14 day free trial at this time.

## 2020-03-26 ENCOUNTER — Telehealth: Payer: Self-pay | Admitting: Dietician

## 2020-03-26 NOTE — Telephone Encounter (Addendum)
Calling patient to pick up samples. She said she would be here Wednesday morning.

## 2020-03-28 ENCOUNTER — Telehealth: Payer: Self-pay | Admitting: Pharmacist

## 2020-03-28 NOTE — Telephone Encounter (Signed)
Medication Samples have been provided to the patient.  Drug name: Jardiance (empagliflozin)       Strength: 10mg     Qty: 28 tablets  LOT: [id number] (1 box), [id number] (1 box), [id number] (2 boxes) Exp.Date: 8/23, 3/22, 8/22  Dosing instructions: take one tablet by mouth daily  The patient has been instructed regarding the correct time, dose, and frequency of taking this medication, including desired effects and most common side effects.   Patient states she has been tolerating Jardiance well and she likes the medication.  Hughes Better 10:32 AM 03/28/2020

## 2020-05-10 ENCOUNTER — Other Ambulatory Visit: Payer: Self-pay | Admitting: Internal Medicine

## 2020-05-10 DIAGNOSIS — E1159 Type 2 diabetes mellitus with other circulatory complications: Secondary | ICD-10-CM

## 2020-05-10 DIAGNOSIS — E1121 Type 2 diabetes mellitus with diabetic nephropathy: Secondary | ICD-10-CM

## 2020-05-10 DIAGNOSIS — I152 Hypertension secondary to endocrine disorders: Secondary | ICD-10-CM

## 2020-05-17 ENCOUNTER — Telehealth: Payer: Self-pay

## 2020-05-17 NOTE — Telephone Encounter (Signed)
Spoke to [name] today over the phone regarding patient assistance for her Jardiance.  I gave her the number to [place] (LIS) [phone] to determine eligibility.  I explained to patient that patients that apply with [employer] are required to provide a LIS denial letter with application for approval.  Patient stated she would call to determine eligibility and once she receives the denial letter she will bring this to Internal Medicine and sign the patient assistance application at that time.

## 2020-05-24 ENCOUNTER — Other Ambulatory Visit: Payer: Self-pay | Admitting: Internal Medicine

## 2020-05-24 DIAGNOSIS — E785 Hyperlipidemia, unspecified: Secondary | ICD-10-CM

## 2020-06-02 ENCOUNTER — Other Ambulatory Visit: Payer: Self-pay | Admitting: Student

## 2020-06-02 DIAGNOSIS — E1159 Type 2 diabetes mellitus with other circulatory complications: Secondary | ICD-10-CM

## 2020-06-02 DIAGNOSIS — I152 Hypertension secondary to endocrine disorders: Secondary | ICD-10-CM

## 2020-06-05 ENCOUNTER — Encounter: Payer: Self-pay | Admitting: *Deleted

## 2020-06-05 NOTE — Progress Notes (Signed)

## 2020-06-06 NOTE — Progress Notes (Signed)
Things That May Be Affecting Your Health:  Alcohol  Hearing loss  Pain   X Depression  Home Safety  Sexual Health  X Diabetes X Lack of physical activity  Stress   Difficulty with daily activities  Loneliness  Tiredness   Drug use  Medicines  Tobacco use   Falls  Motor Vehicle Safety  Weight   Food choices  Oral Health  Other    YOUR PERSONALIZED HEALTH PLAN : 1. Schedule your next subsequent Medicare Wellness visit in one year 2. Attend all of your regular appointments to address your medical issues 3. Complete the preventative screenings and services   Annual Wellness Visit   Medicare Covered Preventative Screenings and [place] Men and Women Who How Often Need? Date of Last Service Action  Abdominal Aortic Aneurysm Adults with AAA risk factors Once     Alcohol Misuse and Counseling All Adults Screening once a year if no alcohol misuse. Counseling up to 4 face to face sessions.     Bone Density Measurement  Adults at risk for osteoporosis Once every 2 yrs     Lipid Panel Z13.6 All adults without CV disease Once every 5 yrs     Colorectal Cancer   Stool sample or  Colonoscopy All adults 3 and older   Once every year  Every 10 years     Depression All Adults Once a year  Today   Diabetes Screening Blood glucose, post glucose load, or GTT Z13.1  All adults at risk  Pre-diabetics  Once per year  Twice per year     Diabetes  Self-Management Training All adults Diabetics 10 hrs first year; 2 hours subsequent years. Requires Copay     Glaucoma  Diabetics  Family history of glaucoma  African Americans 82 yrs +  Hispanic Americans 48 yrs + Annually - requires coppay     Hepatitis C Z72.89 or F19.20  High Risk for HCV  Born between 1945 and 1965  Annually  Once     HIV Z11.4 All adults based on risk  Annually btw ages 63 & 73 regardless of risk  Annually > 65 yrs if at increased risk     Lung Cancer Screening Asymptomatic adults aged  53-77 with 30 pack yr history and current smoker OR quit within the last 15 yrs Annually Must have counseling and shared decision making documentation before first screen     Medical Nutrition Therapy Adults with   Diabetes  Renal disease  Kidney transplant within past 3 yrs 3 hours first year; 2 hours subsequent years     Obesity and Counseling All adults Screening once a year Counseling if BMI 30 or higher  Today   Tobacco Use Counseling Adults who use tobacco  Up to 8 visits in one year     Vaccines Z23  Hepatitis B  Influenza   Pneumonia  Adults   Once  Once every flu season  Two different vaccines separated by one year X Flu, COVID    Next Annual Wellness Visit People with Medicare Every year  Today     Services & Screenings Women Who How Often Need  Date of Last Service Action  Mammogram  Z12.31 Women over 29 One baseline ages 49-39. Annually ager 40 yrs+     Pap tests All women Annually if high risk. Every 2 yrs for normal risk women     Screening for cervical cancer with   Pap (Z01.419 nl or Z01.411abnl) &  HPV Z11.51 Women aged 11 to 88 Once every 5 yrs     Screening pelvic and breast exams All women Annually if high risk. Every 2 yrs for normal risk women     Sexually Transmitted Diseases  Chlamydia  Gonorrhea  Syphilis All at risk adults Annually for non pregnant females at increased risk         [place] Men Who How Ofter Need  Date of Last Service Action  Prostate Cancer - DRE & PSA Men over 50 Annually.  DRE might require a copay.     Sexually Transmitted Diseases  Syphilis All at risk adults Annually for men at increased risk

## 2020-06-08 ENCOUNTER — Encounter (HOSPITAL_COMMUNITY): Payer: Self-pay | Admitting: Emergency Medicine

## 2020-06-08 ENCOUNTER — Other Ambulatory Visit: Payer: Self-pay

## 2020-06-08 ENCOUNTER — Ambulatory Visit (HOSPITAL_COMMUNITY)
Admission: EM | Admit: 2020-06-08 | Discharge: 2020-06-08 | Disposition: A | Discharge status: Home / Self Care | Payer: Medicare Other | Attending: Internal Medicine | Admitting: Internal Medicine

## 2020-06-08 DIAGNOSIS — R1030 Lower abdominal pain, unspecified: Secondary | ICD-10-CM | POA: Diagnosis not present

## 2020-06-08 DIAGNOSIS — R1031 Right lower quadrant pain: Secondary | ICD-10-CM

## 2020-06-08 LAB — POCT URINALYSIS DIPSTICK, ED / UC
Bilirubin Urine: NEGATIVE
Glucose, UA: NEGATIVE mg/dL
Hgb urine dipstick: NEGATIVE
Ketones, ur: NEGATIVE mg/dL
Nitrite: NEGATIVE
Protein, ur: NEGATIVE mg/dL
Specific Gravity, Urine: 1.01 (ref 1.005–1.030)
Urobilinogen, UA: 0.2 mg/dL (ref 0.0–1.0)
pH: 6 (ref 5.0–8.0)

## 2020-06-08 LAB — BASIC METABOLIC PANEL
Anion gap: 11 (ref 5–15)
BUN: 15 mg/dL (ref 8–23)
CO2: 29 mmol/L (ref 22–32)
Calcium: 9.7 mg/dL (ref 8.9–10.3)
Chloride: 100 mmol/L (ref 98–111)
Creatinine, Ser: 0.92 mg/dL (ref 0.44–1.00)
GFR, Estimated: >60 mL/min (ref >60)
Glucose, Bld: 133 mg/dL — ABNORMAL HIGH (ref 70–99)
Potassium: 3.7 mmol/L (ref 3.5–5.1)
Sodium: 140 mmol/L (ref 135–145)

## 2020-06-08 LAB — CBC WITH DIFFERENTIAL/PLATELET
Abs Immature Granulocytes: 0.02 10*3/uL (ref 0.00–0.07)
Basophils Absolute: 0 10*3/uL (ref 0.0–0.1)
Basophils Relative: 0 %
Eosinophils Absolute: 0.1 10*3/uL (ref 0.0–0.5)
Eosinophils Relative: 1 %
HCT: 35.9 % — ABNORMAL LOW (ref 36.0–46.0)
Hemoglobin: 11.6 g/dL — ABNORMAL LOW (ref 12.0–15.0)
Immature Granulocytes: 0 %
Lymphocytes Relative: 41 %
Lymphs Abs: 2.1 10*3/uL (ref 0.7–4.0)
MCH: 30.6 pg (ref 26.0–34.0)
MCHC: 32.3 g/dL (ref 30.0–36.0)
MCV: 94.7 fL (ref 80.0–100.0)
Monocytes Absolute: 0.3 10*3/uL (ref 0.1–1.0)
Monocytes Relative: 5 %
Neutro Abs: 2.7 10*3/uL (ref 1.7–7.7)
Neutrophils Relative %: 53 %
Platelets: 201 10*3/uL (ref 150–400)
RBC: 3.79 MIL/uL — ABNORMAL LOW (ref 3.87–5.11)
RDW: 15.5 % (ref 11.5–15.5)
WBC: 5.1 10*3/uL (ref 4.0–10.5)
nRBC: 0 % (ref 0.0–0.2)

## 2020-06-08 MED ORDER — ACETAMINOPHEN 500 MG PO TABS: 500.0000 mg | ORAL_TABLET | Freq: Four times a day (QID) | ORAL | 0 refills | Status: DC | PRN

## 2020-06-08 NOTE — ED Triage Notes (Signed)
Pt presents today with c/o of abdominal pain (lower abd) that radiates to lower back. C/o painful urination x 4-5 days.

## 2020-06-08 NOTE — Discharge Instructions (Addendum)
Your urine looks well today without obvious indication of UTI.  I have sent your urine to be cultured to confirm this.  We will check your vaginal swab as well to ensure your discharge is not source of symptoms.  I am checking some labs and would call you if there are any indications of need for further evaluation.  Tylenol as needed for pain.  If worsening of pain please go to the ER.

## 2020-06-08 NOTE — ED Provider Notes (Signed)
[place]    CSN: [id number] Arrival date & time: 06/08/20  4193      History   Chief Complaint Chief Complaint  Patient presents with  . Abdominal Pain    bilateral    HPI [name] is a 75 y.o. female.   [name] presents with complaints of low abdominal pain which she has noted intermittently over the past week or so but worse last night. No nausea or vomiting. No diarrhea or constipation. This morning feels improved. Some pain with urination. Pain can radiate to left groin. When pain was severe pain radiated around back. No urinary frequency. No blood to urine. She has had total hysterectomy with oophorectomy bilaterally. She endorses having had brown vaginal discharge recently. No vaginal itching. She states she had issues with her husband for years so had not had sex until the past year, but has had minimal sexual encounters over the past year. No fevers. No URI symptoms. She states she had a colonoscopy last year. She states feels similar to UTI's she has had in the past. Last similar was years ago.     ROS per HPI, negative if not otherwise mentioned.      Past Medical History:  Diagnosis Date  . Allergy   . Anemia   . Breast CA ([place])    (Rt) breast ca dx 4/12---  S/P RADICAL MASTECTOMY AND CHEMORADIATION  . Diabetes mellitus ORAL MED  . Fatty liver disease, nonalcoholic   . History of hyperkalemia   . Hx antineoplastic chemo  10/12 - 06/24/11   PACLITAX EL COMPLETED 06/24/11  . Hyperlipidemia   . Hypertension   . Mild nonproliferative diabetic retinopathy of right eye ([place]) 03/19/11   Dr. Joseph Art  . Numbness and tingling    Hx: of in fingers and toes since chemotherapy  . OA (osteoarthritis) of knee    RIGHT LEG  . Obesity   . Renal lesion     Patient Active Problem List   Diagnosis Date Noted  . Fatigue 02/10/2019  . Primary osteoarthritis of left knee 01/25/2019  . Left carpal tunnel syndrome 01/25/2019  . Cervical  neuropathy 08/20/2018  . Bilateral carpal tunnel syndrome 06/29/2018  . Depressed mood 01/21/2018  . Need for immunization against influenza 01/17/2018  . Osteopenia 12/13/2013  . Vaginal dryness 12/13/2013  . Peripheral neuropathy 11/02/2013  . Health care maintenance 11/17/2012  . Arthritis of knee, right 08/16/2012  . Breast cancer, right breast ([place]) 09/25/2010  . COLONIC POLYPS, HX OF 07/19/2007  . OBESITY NOS 07/09/2006  . Type 2 diabetes mellitus with renal manifestations ([place]) 03/10/2006  . Hyperlipidemia 03/10/2006  . Hypertension associated with diabetes ([place]) 03/10/2006  . ALLERGIC RHINITIS 03/10/2006  . Fatty liver 03/10/2006    Past Surgical History:  Procedure Laterality Date  . COLONOSCOPY W/ POLYPECTOMY     Hx; of  . I & D KNEE WITH POLY EXCHANGE Right 09/03/2012   Procedure: IRRIGATION AND DEBRIDEMENT RIGHT KNEE WITH POLY EXCHANGE;  Surgeon: Kerin Salen, MD;  Location: [facility];  Service: Orthopedics;  Laterality: Right;  . JOINT REPLACEMENT    . knee arthroscopy  10-28-1999   RIGHT  . Laparoscopy with laparoscopic right salpingo oophorectomy and lysis of pelvic and abdominal adhesions  March 2013   Dr. Nori Riis   . MASTECTOMY MODIFIED RADICAL  12-04-2010   W/ LEFT PAC PLACEMENT (RIGHT BREAST W/ AXILLARY CONTENTS/ NODE BX'S  . PORT-A-CATH REMOVAL  09/24/2011   Procedure:  REMOVAL PORT-A-CATH;  Surgeon: Joyice Faster. Cornett, MD;  Location: [facility];  Service: General;  Laterality: N/A;  . PORTACATH PLACEMENT     REPLACED PAC DUE TO MALFUNCTION (LEFT)  . TOTAL KNEE ARTHROPLASTY Right 08/16/2012   Procedure: TOTAL KNEE ARTHROPLASTY;  Surgeon: Kerin Salen, MD;  Location: Home;  Service: Orthopedics;  Laterality: Right;  . TRANSTHORACIC ECHOCARDIOGRAM  10-29-2010   NORMAL LVF/ EF 55-60%/ MILD MV REGURG  . TUBAL LIGATION  YRS AGO  . VAGINAL HYSTERECTOMY  AGE 18   W/ LSO    OB History    Gravida  4   Para  4   Term  4   Preterm      AB      Living  4     SAB       IAB      Ectopic      Multiple      Live Births               Home Medications    Prior to Admission medications   Medication Sig Start Date End Date Taking? Authorizing Provider  acetaminophen (TYLENOL) 500 MG tablet Take 1 tablet (500 mg total) by mouth every 6 (six) hours as needed. 06/08/20  Yes Augusto Gamble B, NP  Accu-Chek Softclix Lancets lancets Check blood sugar up to three times daily. 05/12/19   Ina Homes, MD  atorvastatin (LIPITOR) 40 MG tablet TAKE 1 TABLET BY MOUTH EVERYDAY AT BEDTIME 05/24/20   Harvie Heck, MD  chlorthalidone (HYGROTON) 25 MG tablet TAKE 1 TABLET BY MOUTH EVERY DAY 06/04/20   Jeralyn Bennett, MD  empagliflozin (JARDIANCE) 10 MG TABS tablet Take 1 tablet (10 mg total) by mouth daily. 03/23/20   [name], Vasilios, MD  fluticasone (FLONASE) 50 MCG/ACT nasal spray Place 2 sprays into both nostrils daily. Patient taking differently: Place 2 sprays into both nostrils daily as needed (allergies.).  06/21/18 08/07/21  Santos-Sanchez, Merlene Morse, MD  glucose blood (ACCU-CHEK GUIDE) test strip Check blood sugar up to three times daily. 05/12/19   Ina Homes, MD  HYDROcodone-acetaminophen (NORCO) 5-325 MG tablet Take 1-2 tablets by mouth 3 (three) times daily as needed for moderate pain. Patient not taking: Reported on 08/08/2019 02/17/19   Aundra Dubin, PA-C  lisinopril (ZESTRIL) 20 MG tablet TAKE 1 TABLET BY MOUTH EVERY DAY 05/10/20   Jeralyn Bennett, MD  Magnesium 250 MG TABS Take 250 mg by mouth 3 (three) times a week.    [provider]  metFORMIN (GLUCOPHAGE-XR) 500 MG 24 hr tablet Take 2 tablets (1,000 mg total) by mouth daily with breakfast. 03/20/20   [name], Vasilios, MD  mirtazapine (REMERON) 15 MG tablet Take 1 tablet (15 mg total) by mouth at bedtime. Patient not taking: Reported on 08/08/2019 02/10/19   Ina Homes, MD  Polyethyl Glycol-Propyl Glycol 0.4-0.3 % SOLN Place 1 drop into both eyes 3 (three) times daily as  needed (dry/irritated eyes.).    [provider]  Potassium 99 MG TABS Take 99 mg by mouth 3 (three) times a week.    [provider]  traMADol (ULTRAM) 50 MG tablet Take 1 tablet (50 mg total) by mouth every 12 (twelve) hours as needed for severe pain. Patient not taking: Reported on 08/08/2019 08/19/18   Lestine Box, PA-C    Family History Family History  Problem Relation Age of Onset  . Stroke Father   . Heart disease Sister   . Cancer Sister  breast  . Heart disease Brother   . Cancer Sister        COLON CANCER    Social History Social History   Tobacco Use  . Smoking status: Never Smoker  . Smokeless tobacco: Never Used  Substance Use Topics  . Alcohol use: No    Alcohol/week: 0.0 standard drinks  . Drug use: No    Types: Flunitrazepam     Allergies   Hctz [hydrochlorothiazide]   Review of Systems Review of Systems   Physical Exam Triage Vital Signs ED Triage Vitals  Enc Vitals Group     BP 06/08/20 0852 119/79     Pulse Rate 06/08/20 0852 80     Resp 06/08/20 0852 18     Temp 06/08/20 0852 98.2 F (36.8 C)     Temp Source 06/08/20 0852 Oral     SpO2 06/08/20 0852 99 %     Weight --      Height --      Head Circumference --      Peak Flow --      Pain Score 06/08/20 0857 2     Pain Loc --      Pain Edu? --      Excl. in [place]? --    No data found.  Updated Vital Signs BP 119/79 (BP Location: Left Arm)   Pulse 80   Temp 98.2 F (36.8 C) (Oral)   Resp 18   SpO2 99%   Visual Acuity Right Eye Distance:   Left Eye Distance:   Bilateral Distance:    Right Eye Near:   Left Eye Near:    Bilateral Near:     Physical Exam Constitutional:      General: She is not in acute distress.    Appearance: She is well-developed.  Cardiovascular:     Rate and Rhythm: Normal rate.  Pulmonary:     Effort: Pulmonary effort is normal.  Abdominal:     Tenderness: There is abdominal tenderness in the right lower quadrant,  suprapubic area and left lower quadrant. There is no right CVA tenderness, left CVA tenderness, guarding or rebound.  Skin:    General: Skin is warm and dry.  Neurological:     Mental Status: She is alert and oriented to person, place, and time.      UC Treatments / Results  Labs (all labs ordered are listed, but only abnormal results are displayed) Labs Reviewed  CBC WITH DIFFERENTIAL/PLATELET - Abnormal; Notable for the following components:      Result Value   RBC 3.79 (*)    Hemoglobin 11.6 (*)    HCT 35.9 (*)    All other components within normal limits  BASIC METABOLIC PANEL - Abnormal; Notable for the following components:   Glucose, Bld 133 (*)    All other components within normal limits  POCT URINALYSIS DIPSTICK, ED / UC - Abnormal; Notable for the following components:   Leukocytes,Ua SMALL (*)    All other components within normal limits  URINE CULTURE  CERVICOVAGINAL ANCILLARY ONLY    EKG   Radiology No results found.  Procedures Procedures (including critical care time)  Medications Ordered in UC Medications - No data to display  Initial Impression / Assessment and Plan / UC Course  I have reviewed the triage vital signs and the nursing notes.  Pertinent labs & imaging results that were available during my care of the patient were reviewed by me and considered in my medical  decision making (see chart for details).     Non toxic. Vitals stable. Findings are not consistent with surgical abdomen. Urine is fairly unremarkable, culture pending to confirm. Vaginal discharge despite total hysterectomy, vaginal cytology collected and pending. Basic labs are essentially unremarkable without evidence of infectious etiology at this time. Return precautions provided. Patient verbalized understanding and agreeable to plan.    Final Clinical Impressions(s) / UC Diagnoses   Final diagnoses:  Right lower quadrant abdominal pain  Lower abdominal pain      Discharge Instructions     Your urine looks well today without obvious indication of UTI.  I have sent your urine to be cultured to confirm this.  We will check your vaginal swab as well to ensure your discharge is not source of symptoms.  I am checking some labs and would call you if there are any indications of need for further evaluation.  Tylenol as needed for pain.  If worsening of pain please go to the ER.     ED Prescriptions    Medication Sig Dispense Auth. Provider   acetaminophen (TYLENOL) 500 MG tablet Take 1 tablet (500 mg total) by mouth every 6 (six) hours as needed. 30 tablet Zigmund Gottron, NP     PDMP not reviewed this encounter.   Zigmund Gottron, NP 06/08/20 1154

## 2020-06-09 LAB — URINE CULTURE

## 2020-06-10 LAB — CERVICOVAGINAL ANCILLARY ONLY
Bacterial Vaginitis (gardnerella): NEGATIVE
Candida Glabrata: NEGATIVE
Candida Vaginitis: NEGATIVE
Chlamydia: NEGATIVE
Comment: NEGATIVE
Comment: NEGATIVE
Comment: NEGATIVE
Comment: NEGATIVE
Comment: NEGATIVE
Comment: NORMAL
Neisseria Gonorrhea: NEGATIVE
Trichomonas: NEGATIVE

## 2020-06-13 NOTE — Telephone Encounter (Signed)
I will be at [facility] that day. When she comes for appt can you discuss with her or call ahead of time to see if she has denial and can bring in that day?   Thanks!

## 2020-06-18 ENCOUNTER — Telehealth: Payer: Self-pay

## 2020-06-18 NOTE — Telephone Encounter (Signed)
Pt called back. She has not reached out to [place] yet to get LIS letter. I gave her the number again & she says she will call back and try to get it.. she will bring it in as soon as she has it, or will call me back if she has any questions or issues.

## 2020-06-18 NOTE — Telephone Encounter (Signed)
Left voice message following up about [employer] application. Still needing LIS denial letter brought in, & for application to be signed. Callback # [phone]

## 2020-06-19 ENCOUNTER — Ambulatory Visit: Payer: Self-pay

## 2020-06-19 ENCOUNTER — Ambulatory Visit (INDEPENDENT_AMBULATORY_CARE_PROVIDER_SITE_OTHER): Payer: Medicare Other | Admitting: Orthopaedic Surgery

## 2020-06-19 ENCOUNTER — Encounter: Payer: Self-pay | Admitting: Orthopaedic Surgery

## 2020-06-19 VITALS — Ht 59.0 in | Wt 150.0 lb

## 2020-06-19 DIAGNOSIS — M1712 Unilateral primary osteoarthritis, left knee: Secondary | ICD-10-CM | POA: Diagnosis not present

## 2020-06-19 MED ORDER — MELOXICAM 7.5 MG PO TABS: 7.5000 mg | ORAL_TABLET | Freq: Two times a day (BID) | ORAL | 2 refills | Status: DC | PRN

## 2020-06-19 NOTE — Progress Notes (Signed)
Office Visit Note   Patient: [name]           Date of Birth: 10-07-1945           MRN: [id number] Visit Date: 06/19/2020              Requested by: Riesa Pope, MD [place],  [place] [id number] PCP: Riesa Pope, MD   Assessment & Plan: Visit Diagnoses:  1. Primary osteoarthritis of left knee     Plan: Patient continues to have severe and chronic pain throughout the day and night in the left knee.  She is unable to perform ADLs.  She will remain on see tomorrow PCP and she meloxicam prescribed today.  Also recommended Voltaren gel to try.  Patient will get back in touch with [place] to A1c results.  Follow-Up Instructions: Return if symptoms worsen or fail to improve.   Orders:  Orders Placed This Encounter  Procedures  . XR KNEE 3 VIEW LEFT   Meds ordered this encounter  Medications  . meloxicam (MOBIC) 7.5 MG tablet    Sig: Take 1 tablet (7.5 mg total) by mouth 2 (two) times daily as needed for pain.    Dispense:  30 tablet    Refill:  2      Procedures: No procedures performed   Clinical Data: No additional findings.   Subjective: Chief Complaint  Patient presents with  . Left Knee - Pain    [name] comes in today for chronic severe left knee pain.  Surgery was canceled back in November for uncontrolled diabetes.  She has a follow-up A1c with PCP tomorrow.  Denies any changes medically otherwise.   Review of Systems  Constitutional: Negative.   HENT: Negative.   Eyes: Negative.   Respiratory: Negative.   Cardiovascular: Negative.   Endocrine: Negative.   Musculoskeletal: Negative.   Neurological: Negative.   Hematological: Negative.   Psychiatric/Behavioral: Negative.   All other systems reviewed and are negative.    Objective: Vital Signs: Ht 4\' 11"  (1.499 m)   Wt 150 lb (68 kg)   BMI 30.30 kg/m   Physical Exam Vitals and nursing note reviewed.  Constitutional:      Appearance: She is well-developed  and well-nourished.  Pulmonary:     Effort: Pulmonary effort is normal.  Skin:    General: Skin is warm.     Capillary Refill: Capillary refill takes less than 2 seconds.  Neurological:     Mental Status: She is alert and oriented to person, place, and time.  Psychiatric:        Mood and Affect: Mood and affect normal.        Behavior: Behavior normal.        Thought Content: Thought content normal.        Judgment: Judgment normal.     Ortho Exam Left knee shows varus deformity.  Severe pain with range of motion.  Range of motion is moderately limited.  Collaterals and cruciates are stable. Specialty Comments:  No specialty comments available.  Imaging: XR KNEE 3 VIEW LEFT  Result Date: 06/19/2020 Advanced tricompartment DJD with varus deformity.    PMFS History: Patient Active Problem List   Diagnosis Date Noted  . Fatigue 02/10/2019  . Primary osteoarthritis of left knee 01/25/2019  . Left carpal tunnel syndrome 01/25/2019  . Cervical neuropathy 08/20/2018  . Bilateral carpal tunnel syndrome 06/29/2018  . Depressed mood 01/21/2018  . Need for immunization against influenza  01/17/2018  . Osteopenia 12/13/2013  . Vaginal dryness 12/13/2013  . Peripheral neuropathy 11/02/2013  . Health care maintenance 11/17/2012  . Arthritis of knee, right 08/16/2012  . Breast cancer, right breast ([place]) 09/25/2010  . COLONIC POLYPS, HX OF 07/19/2007  . OBESITY NOS 07/09/2006  . Type 2 diabetes mellitus with renal manifestations ([place]) 03/10/2006  . Hyperlipidemia 03/10/2006  . Hypertension associated with diabetes ([place]) 03/10/2006  . ALLERGIC RHINITIS 03/10/2006  . Fatty liver 03/10/2006   Past Medical History:  Diagnosis Date  . Allergy   . Anemia   . Breast CA ([place])    (Rt) breast ca dx 4/12---  S/P RADICAL MASTECTOMY AND CHEMORADIATION  . Diabetes mellitus ORAL MED  . Fatty liver disease, nonalcoholic   . History of hyperkalemia   . Hx antineoplastic chemo  10/12 -  06/24/11   PACLITAX EL COMPLETED 06/24/11  . Hyperlipidemia   . Hypertension   . Mild nonproliferative diabetic retinopathy of right eye ([place]) 03/19/11   Dr. Joseph Art  . Numbness and tingling    Hx: of in fingers and toes since chemotherapy  . OA (osteoarthritis) of knee    RIGHT LEG  . Obesity   . Renal lesion     Family History  Problem Relation Age of Onset  . Stroke Father   . Heart disease Sister   . Cancer Sister        breast  . Heart disease Brother   . Cancer Sister        COLON CANCER    Past Surgical History:  Procedure Laterality Date  . COLONOSCOPY W/ POLYPECTOMY     Hx; of  . I & D KNEE WITH POLY EXCHANGE Right 09/03/2012   Procedure: IRRIGATION AND DEBRIDEMENT RIGHT KNEE WITH POLY EXCHANGE;  Surgeon: Kerin Salen, MD;  Location: [facility];  Service: Orthopedics;  Laterality: Right;  . JOINT REPLACEMENT    . knee arthroscopy  10-28-1999   RIGHT  . Laparoscopy with laparoscopic right salpingo oophorectomy and lysis of pelvic and abdominal adhesions  March 2013   Dr. Nori Riis   . MASTECTOMY MODIFIED RADICAL  12-04-2010   W/ LEFT PAC PLACEMENT (RIGHT BREAST W/ AXILLARY CONTENTS/ NODE BX'S  . PORT-A-CATH REMOVAL  09/24/2011   Procedure: REMOVAL PORT-A-CATH;  Surgeon: Joyice Faster. Cornett, MD;  [facility];  Service: General;  Laterality: N/A;  . PORTACATH PLACEMENT     REPLACED PAC DUE TO MALFUNCTION (LEFT)  . TOTAL KNEE ARTHROPLASTY Right 08/16/2012   Procedure: TOTAL KNEE ARTHROPLASTY;  Surgeon: Kerin Salen, MD;  Location: [facility];  Service: Orthopedics;  Laterality: Right;  . TRANSTHORACIC ECHOCARDIOGRAM  10-29-2010   NORMAL LVF/ EF 55-60%/ MILD MV REGURG  . TUBAL LIGATION  YRS AGO  . VAGINAL HYSTERECTOMY  AGE 57   W/ LSO   Social History   Occupational History  . Not on file  Tobacco Use  . Smoking status: Never Smoker  . Smokeless tobacco: Never Used  Substance and Sexual Activity  . Alcohol use: No    Alcohol/week: 0.0 standard drinks  . Drug use:  No    Types: Flunitrazepam  . Sexual activity: Not on file    Comment: MENARCHE AGE 78, G4, P4, PARITY AGE 64, HRT 20+ YEARS

## 2020-06-20 ENCOUNTER — Encounter: Payer: Self-pay | Admitting: Student

## 2020-06-20 ENCOUNTER — Ambulatory Visit (INDEPENDENT_AMBULATORY_CARE_PROVIDER_SITE_OTHER): Payer: Medicare Other | Admitting: Student

## 2020-06-20 VITALS — BP 126/66 | HR 69 | Wt 149.6 lb

## 2020-06-20 DIAGNOSIS — Z23 Encounter for immunization: Secondary | ICD-10-CM

## 2020-06-20 DIAGNOSIS — E1121 Type 2 diabetes mellitus with diabetic nephropathy: Secondary | ICD-10-CM

## 2020-06-20 LAB — POCT GLYCOSYLATED HEMOGLOBIN (HGB A1C): Hemoglobin A1C: 7.7 % — AB (ref 4.0–5.6)

## 2020-06-20 LAB — GLUCOSE, CAPILLARY: Glucose-Capillary: 200 mg/dL — ABNORMAL HIGH (ref 70–99)

## 2020-06-20 NOTE — Progress Notes (Signed)
CC: hyperglycemia  HPI:  [name] is a 75 y.o. female with a past medical history stated below and presents today for a1c recheck and assessment of status of diabetes. Please see problem based assessment and plan for additional details.  Past Medical History:  Diagnosis Date  . Allergy   . Anemia   . Breast CA ([place])    (Rt) breast ca dx 4/12---  S/P RADICAL MASTECTOMY AND CHEMORADIATION  . Diabetes mellitus ORAL MED  . Fatty liver disease, nonalcoholic   . History of hyperkalemia   . Hx antineoplastic chemo  10/12 - 06/24/11   PACLITAX EL COMPLETED 06/24/11  . Hyperlipidemia   . Hypertension   . Mild nonproliferative diabetic retinopathy of right eye ([place]) 03/19/11   Dr. Joseph Ryan  . Numbness and tingling    Hx: of in fingers and toes since chemotherapy  . OA (osteoarthritis) of knee    RIGHT LEG  . Obesity   . Renal lesion     Current Outpatient Medications on File Prior to Visit  Medication Sig Dispense Refill  . Accu-Chek Softclix Lancets lancets Check blood sugar up to three times daily. 300 each 3  . acetaminophen (TYLENOL) 500 MG tablet Take 1 tablet (500 mg total) by mouth every 6 (six) hours as needed. 30 tablet 0  . atorvastatin (LIPITOR) 40 MG tablet TAKE 1 TABLET BY MOUTH EVERYDAY AT BEDTIME 90 tablet 1  . chlorthalidone (HYGROTON) 25 MG tablet TAKE 1 TABLET BY MOUTH EVERY DAY 90 tablet 0  . empagliflozin (JARDIANCE) 10 MG TABS tablet Take 1 tablet (10 mg total) by mouth daily. 30 tablet 4  . fluticasone (FLONASE) 50 MCG/ACT nasal spray Place 2 sprays into both nostrils daily. (Patient taking differently: Place 2 sprays into both nostrils daily as needed (allergies.).) 16 g 0  . glucose blood (ACCU-CHEK GUIDE) test strip Check blood sugar up to three times daily. 300 each 3  . HYDROcodone-acetaminophen (NORCO) 5-325 MG tablet Take 1-2 tablets by mouth 3 (three) times daily as needed for moderate pain. 30 tablet 0  . lisinopril (ZESTRIL) 20 MG  tablet TAKE 1 TABLET BY MOUTH EVERY DAY 90 tablet 1  . Magnesium 250 MG TABS Take 250 mg by mouth 3 (three) times a week.    . meloxicam (MOBIC) 7.5 MG tablet Take 1 tablet (7.5 mg total) by mouth 2 (two) times daily as needed for pain. 30 tablet 2  . metFORMIN (GLUCOPHAGE-XR) 500 MG 24 hr tablet Take 2 tablets (1,000 mg total) by mouth daily with breakfast. 90 tablet 3  . mirtazapine (REMERON) 15 MG tablet Take 1 tablet (15 mg total) by mouth at bedtime. 30 tablet 0  . Polyethyl Glycol-Propyl Glycol 0.4-0.3 % SOLN Place 1 drop into both eyes 3 (three) times daily as needed (dry/irritated eyes.).    Marland Kitchen Potassium 99 MG TABS Take 99 mg by mouth 3 (three) times a week.    . traMADol (ULTRAM) 50 MG tablet Take 1 tablet (50 mg total) by mouth every 12 (twelve) hours as needed for severe pain. 10 tablet 0   No current facility-administered medications on file prior to visit.    Family History  Problem Relation Age of Onset  . Stroke Father   . Heart disease Sister   . Cancer Sister        breast  . Heart disease Brother   . Cancer Sister        COLON CANCER    Social History  Socioeconomic History  . Marital status: Married    Spouse name: Not on file  . Number of children: Not on file  . Years of education: 23  . Highest education level: Not on file  Occupational History  . Not on file  Tobacco Use  . Smoking status: Never Smoker  . Smokeless tobacco: Never Used  Substance and Sexual Activity  . Alcohol use: No    Alcohol/week: 0.0 standard drinks  . Drug use: No    Types: Flunitrazepam  . Sexual activity: Not on file    Comment: MENARCHE AGE 7, G4, P4, PARITY AGE 78, HRT 20+ YEARS    Other Topics Concern  . 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  Patient needs to submit further paperwork to complete-  Bonna Gains 08/14/09.   Financial assistance application completed.  Patient does not qualify for [place] 09/12/09.            Social Determinants of Health   Financial Resource Strain: Not on file  Food Insecurity: Not on file  Transportation Needs: Not on file  Physical Activity: Not on file  Stress: Not on file  Social Connections: Not on file  Intimate Partner Violence: Not on file    Review of Systems: ROS negative except for what is noted on the assessment and plan.  Vitals:   06/20/20 1012  BP: 126/66  Pulse: 69  SpO2: 100%  Weight: 149 lb 9.6 oz (67.9 kg)     Physical Exam: Constitutional: well-appearing, sitting in chair comfortably, in no acute distress HENT: normocephalic atraumatic Eyes: conjunctiva non-erythematous Neck: supple Cardiovascular: regular rate and rhythm, no m/r/g Pulmonary/Chest: normal work of breathing on room air MSK: normal bulk and tone Neurological: alert & oriented x 3 Skin: warm and dry Psych: normal though process   Assessment & Plan:   See Encounters Tab for problem based charting.  Patient discussed with Dr. Fanny Ryan, D.O. [facility], PGY-1 Pager: [phone], Phone: [phone] Date 06/20/2020 Time 10:13 AM

## 2020-06-20 NOTE — Progress Notes (Signed)
Left voice mail message for patient to call me back to schedule surgery.

## 2020-06-20 NOTE — Progress Notes (Signed)
Ok thank you.  That's a good sign.  We will reach out to her about surgery.

## 2020-06-20 NOTE — Assessment & Plan Note (Addendum)
Lab Results  Component Value Date   HGBA1C 7.7 (A) 06/20/2020   Assessment: A1c from 8.5 to 7.7, improved from 3 months prior. I congratulated her on accomplishing dropping her A1c. She is motivated to have her A1c decrease and diabetes in better control so that she can have her knee surgeries. Ortho have requested better glycemic control prior to them performing the procedure.    She states inconsistence in taking her metformin and when she does, she takes 500 mg. She endorses GI side effects in the past and did not think she should take over 500 mg. I explained to her that she is on the extended release version which has less GI side effects. She agrees to try 1000 mg metformin ER. She has been working with pharmacy to obtain the her jardiance and is waiting for certain paperwork to be sent before she can have financial assistance.   Discussed with her to continue working on diet. Reviewed foods to and not to eat. Discuss bread alternatives like sprout bread.  Plan: -continue metforming ER 1000 mg daily, if unable to tolerate 2/2 GI side effects, take 500 mg daily -continue working with financial team and pharmacy to obtain jardiance.  -repeat A1c in 3 months -A1c today sent to Dr. Erlinda Hong of orthopedics.

## 2020-06-20 NOTE — Patient Instructions (Signed)
Thank you, Ms.Rashay A Pinon for allowing [place] to provide your care today. Today we discussed:  Diabetes - Continue to work on your diet! Try to cut out sugars where you can and if you are going to eat bread, try to eat whole grain/sprout bread like dave's or ezekial bread.   Please continue the metformin extended release 1000 mg (two tablets daily). Please also work with our financial assistance/pharmacist to receive assistance with Tax adviser.   I have ordered the following labs for you:   Lab Orders     Glucose, capillary     POC Hbg A1C     Referrals ordered today:   Referral Orders  No referral(s) requested today     I have ordered the following medication/changed the following medications:   Stop the following medications: There are no discontinued medications.   Start the following medications: No orders of the defined types were placed in this encounter.    Follow up: 3 months    Remember: You are doing great! Keep up the good work on improving your diet.   Should you have any questions or concerns please call the internal medicine clinic at [phone].     Sanjuana Letters, D.O. [place]

## 2020-06-21 NOTE — Progress Notes (Signed)
Spoke with patient yesterday and scheduled surgery. 

## 2020-06-27 ENCOUNTER — Other Ambulatory Visit: Payer: Self-pay

## 2020-06-28 NOTE — Progress Notes (Signed)
Internal Medicine Clinic Attending  Case discussed with Dr. [name]  At the time of the visit.  We reviewed the resident's history and exam and pertinent patient test results.  I agree with the assessment, diagnosis, and plan of care documented in the resident's note.  

## 2020-07-25 NOTE — Pre-Procedure Instructions (Signed)
YENESIS EVEN  07/25/2020    Your procedure is scheduled on Mon., July 30, 2020 from 7:15AM-9:30AM  Report to [facility] Entrance "A" at 5:30AM  Call this number if you have problems the morning of surgery:  [phone]   Remember:  Do not eat after midnight on March 27th  You may drink clear liquids until 3 hours (4:15AM) prior to surgery time.  Clear liquids allowed are: Water, Juice (non-citric and without pulp - diabetics please choose diet or no sugar options), Carbonated beverages - (diabetics please choose diet or no sugar options), Clear Tea, Black Coffee only (no creamer, milk or cream including half and half), Plain Jell-O only (diabetics please choose diet or no sugar options), Gatorade (diabetics please choose diet or no sugar options) and Plain Popsicles only   Enhanced Recovery after Surgery for Orthopedics Enhanced Recovery after Surgery is a protocol used to improve the stress on your body and your recovery after surgery.  Patient Instructions  . The day of surgery (if you have diabetes):  o Drink ONE small bottle of water by __4:15AM___ the morning of surgery o This bottle was given to you during your hospital  pre-op appointment visit.  o Nothing else to drink after completing the  Small bottle of water.         If you have questions, please contact your surgeon's office.   Take these medicines the morning of surgery with A SIP OF WATER: If Needed: Acetaminophen (TYLENOL)  Fluticasone (FLONASE) Polyethyl Glycol-Propyl Glycol (SYSTANE)  As of today, STOP taking all Aspirin (unless instructed by your doctor) and Other Aspirin containing products, Vitamins, Fish oils, and Herbal medications. Also stop all NSAIDS i.e. Advil, Ibuprofen, Motrin, Aleve, Anaprox, Naproxen, BC, Goody Powders, and all Supplements.  How do I manage my blood sugar before surgery? o Check your blood sugar the morning of your surgery when you wake up and every 2 hours until  you get to the Short Stay unit. . If your blood sugar is less than 70 mg/dL, you will need to treat for low blood sugar: o Do not take insulin. o Treat a low blood sugar (less than 70 mg/dL) with  cup of clear juice (cranberry or apple), 4 glucose tablets, OR glucose gel. Recheck blood sugar in 15 minutes after treatment (to make sure it is greater than 70 mg/dL). If your blood sugar is not greater than 70 mg/dL on recheck, call [phone] o  for further instructions. . If your CBG is greater than 220 mg/dL, inform the staff upon arrival to Short Stay.  Reviewed and Endorsed by [facility] Patient Education Committee, August 2015   No Smoking of any kind, Tobacco/Vaping, or Alcohol products 24 hours prior to your procedure. If you use a Cpap at night, you may bring all equipment for your overnight stay.    Day of Surgery:  Do not wear jewelry, make-up or nail polish.  Do not wear lotions, powders, or perfumes, or deodorant.  Do not shave 48 hours prior to surgery.   Do not bring valuables to the hospital.  [facility] is not responsible for any belongings or valuables.  Contacts, dentures or bridgework may not be worn into surgery.   For patients admitted to the hospital, discharge time will be determined by your treatment team.  Patients discharged the day of surgery will not be allowed to drive home, and someone age 27 and over needs to stay with them for 24 hours.  Special instructions:  [place]- Preparing For Surgery  Before surgery, you can play an important role. Because skin is not sterile, your skin needs to be as free of germs as possible. You can reduce the number of germs on your skin by washing with CHG (chlorahexidine gluconate) Soap before surgery.  CHG is an antiseptic cleaner which kills germs and bonds with the skin to continue killing germs even after washing.    Oral Hygiene is also important to reduce your risk of infection.  Remember - BRUSH YOUR TEETH THE  MORNING OF SURGERY WITH YOUR REGULAR TOOTHPASTE  Please do not use if you have an allergy to CHG or antibacterial soaps. If your skin becomes reddened/irritated stop using the CHG.  Do not shave (including legs and underarms) for at least 48 hours prior to first CHG shower. It is OK to shave your face.  Please follow these instructions carefully.   1. Shower the NIGHT BEFORE SURGERY and the MORNING OF SURGERY with CHG.   2. If you chose to wash your hair, wash your hair first as usual with your normal shampoo.  3. After you shampoo, rinse your hair and body thoroughly to remove the shampoo.  4. Use CHG as you would any other liquid soap. You can apply CHG directly to the skin and wash gently with a scrungie or a clean washcloth.   5. Apply the CHG Soap to your body ONLY FROM THE NECK DOWN.  Do not use on open wounds or open sores. Avoid contact with your eyes, ears, mouth and genitals (private parts). Wash Face and genitals (private parts)  with your normal soap.  6. Wash thoroughly, paying special attention to the area where your surgery will be performed.  7. Thoroughly rinse your body with warm water from the neck down.  8. DO NOT shower/wash with your normal soap after using and rinsing off the CHG Soap.  9. Pat yourself dry with a CLEAN TOWEL.  10. Wear CLEAN PAJAMAS to bed the night before surgery, wear comfortable clothes the morning of surgery  11. Place CLEAN SHEETS on your bed the night of your first shower and DO NOT SLEEP WITH PETS.  Reminders: Do not apply any deodorants/lotions.  Please wear clean clothes to the hospital/surgery center.   Remember to brush your teeth WITH YOUR REGULAR TOOTHPASTE.  Please read over the following fact sheets that you were given.

## 2020-07-26 ENCOUNTER — Encounter (HOSPITAL_COMMUNITY)
Admission: RE | Admit: 2020-07-26 | Discharge: 2020-07-26 | Disposition: A | Discharge status: Home / Self Care | Payer: Medicare Other | Source: Ambulatory Visit | Attending: Orthopaedic Surgery | Admitting: Orthopaedic Surgery

## 2020-07-26 ENCOUNTER — Telehealth: Payer: Self-pay

## 2020-07-26 ENCOUNTER — Ambulatory Visit (HOSPITAL_COMMUNITY)
Admission: RE | Admit: 2020-07-26 | Discharge: 2020-07-26 | Disposition: A | Discharge status: Home / Self Care | Payer: Medicare Other | Source: Ambulatory Visit | Attending: Physician Assistant | Admitting: Physician Assistant

## 2020-07-26 ENCOUNTER — Other Ambulatory Visit: Payer: Self-pay | Admitting: Orthopaedic Surgery

## 2020-07-26 ENCOUNTER — Other Ambulatory Visit: Payer: Self-pay | Admitting: Physician Assistant

## 2020-07-26 ENCOUNTER — Other Ambulatory Visit: Payer: Self-pay

## 2020-07-26 ENCOUNTER — Encounter (HOSPITAL_COMMUNITY): Payer: Self-pay

## 2020-07-26 DIAGNOSIS — M1712 Unilateral primary osteoarthritis, left knee: Secondary | ICD-10-CM | POA: Insufficient documentation

## 2020-07-26 DIAGNOSIS — Z20822 Contact with and (suspected) exposure to covid-19: Secondary | ICD-10-CM | POA: Insufficient documentation

## 2020-07-26 DIAGNOSIS — Z01818 Encounter for other preprocedural examination: Secondary | ICD-10-CM | POA: Insufficient documentation

## 2020-07-26 LAB — TYPE AND SCREEN
ABO/RH(D): O POS
Antibody Screen: NEGATIVE

## 2020-07-26 LAB — COMPREHENSIVE METABOLIC PANEL
ALT: 22 U/L (ref 0–44)
AST: 21 U/L (ref 15–41)
Albumin: 4.1 g/dL (ref 3.5–5.0)
Alkaline Phosphatase: 50 U/L (ref 38–126)
Anion gap: 9 (ref 5–15)
BUN: 12 mg/dL (ref 8–23)
CO2: 28 mmol/L (ref 22–32)
Calcium: 8.9 mg/dL (ref 8.9–10.3)
Chloride: 103 mmol/L (ref 98–111)
Creatinine, Ser: 0.87 mg/dL (ref 0.44–1.00)
GFR, Estimated: >60 mL/min (ref >60)
Glucose, Bld: 174 mg/dL — ABNORMAL HIGH (ref 70–99)
Potassium: 2.7 mmol/L — CL (ref 3.5–5.1)
Sodium: 140 mmol/L (ref 135–145)
Total Bilirubin: 0.6 mg/dL (ref 0.3–1.2)
Total Protein: 6.7 g/dL (ref 6.5–8.1)

## 2020-07-26 LAB — CBC WITH DIFFERENTIAL/PLATELET
Abs Immature Granulocytes: 0.01 10*3/uL (ref 0.00–0.07)
Basophils Absolute: 0 10*3/uL (ref 0.0–0.1)
Basophils Relative: 0 %
Eosinophils Absolute: 0 10*3/uL (ref 0.0–0.5)
Eosinophils Relative: 1 %
HCT: 33.2 % — ABNORMAL LOW (ref 36.0–46.0)
Hemoglobin: 11.2 g/dL — ABNORMAL LOW (ref 12.0–15.0)
Immature Granulocytes: 0 %
Lymphocytes Relative: 39 %
Lymphs Abs: 1.8 10*3/uL (ref 0.7–4.0)
MCH: 31.9 pg (ref 26.0–34.0)
MCHC: 33.7 g/dL (ref 30.0–36.0)
MCV: 94.6 fL (ref 80.0–100.0)
Monocytes Absolute: 0.3 10*3/uL (ref 0.1–1.0)
Monocytes Relative: 6 %
Neutro Abs: 2.5 10*3/uL (ref 1.7–7.7)
Neutrophils Relative %: 54 %
Platelets: 221 10*3/uL (ref 150–400)
RBC: 3.51 MIL/uL — ABNORMAL LOW (ref 3.87–5.11)
RDW: 16.3 % — ABNORMAL HIGH (ref 11.5–15.5)
WBC: 4.6 10*3/uL (ref 4.0–10.5)
nRBC: 0 % (ref 0.0–0.2)

## 2020-07-26 LAB — APTT: aPTT: 35 seconds (ref 24–36)

## 2020-07-26 LAB — URINALYSIS, ROUTINE W REFLEX MICROSCOPIC
Bilirubin Urine: NEGATIVE
Glucose, UA: 50 mg/dL — AB
Hgb urine dipstick: NEGATIVE
Ketones, ur: NEGATIVE mg/dL
Nitrite: NEGATIVE
Protein, ur: NEGATIVE mg/dL
Specific Gravity, Urine: 1.014 (ref 1.005–1.030)
pH: 5 (ref 5.0–8.0)

## 2020-07-26 LAB — PROTIME-INR
INR: 1 (ref 0.8–1.2)
Prothrombin Time: 13 seconds (ref 11.4–15.2)

## 2020-07-26 LAB — SARS CORONAVIRUS 2 (TAT 6-24 HRS): SARS Coronavirus 2: NEGATIVE

## 2020-07-26 LAB — GLUCOSE, CAPILLARY: Glucose-Capillary: 184 mg/dL — ABNORMAL HIGH (ref 70–99)

## 2020-07-26 LAB — SURGICAL PCR SCREEN
MRSA, PCR: NEGATIVE
Staphylococcus aureus: NEGATIVE

## 2020-07-26 MED ORDER — ASPIRIN EC 81 MG PO TBEC: 81.0000 mg | DELAYED_RELEASE_TABLET | Freq: Two times a day (BID) | ORAL | 0 refills | Status: DC

## 2020-07-26 MED ORDER — METHOCARBAMOL 500 MG PO TABS: 500.0000 mg | ORAL_TABLET | Freq: Two times a day (BID) | ORAL | 0 refills | Status: DC | PRN

## 2020-07-26 MED ORDER — DOCUSATE SODIUM 100 MG PO CAPS: 100.0000 mg | ORAL_CAPSULE | Freq: Every day | ORAL | 2 refills | Status: DC | PRN

## 2020-07-26 MED ORDER — ONDANSETRON HCL 4 MG PO TABS: 4.0000 mg | ORAL_TABLET | Freq: Three times a day (TID) | ORAL | 0 refills | Status: DC | PRN

## 2020-07-26 MED ORDER — OXYCODONE-ACETAMINOPHEN 5-325 MG PO TABS: 1.0000 | ORAL_TABLET | Freq: Four times a day (QID) | ORAL | 0 refills | Status: DC | PRN

## 2020-07-26 MED ORDER — SULFAMETHOXAZOLE-TRIMETHOPRIM 800-160 MG PO TABS: 1.0000 | ORAL_TABLET | Freq: Two times a day (BID) | ORAL | 0 refills | Status: DC

## 2020-07-26 MED ORDER — POTASSIUM CHLORIDE ER 10 MEQ PO TBCR: 10.0000 meq | EXTENDED_RELEASE_TABLET | Freq: Four times a day (QID) | ORAL | 0 refills | Status: DC

## 2020-07-26 NOTE — Telephone Encounter (Signed)
[facility] called stating that patient has an allergy to sulfa and wanted to clarify Rx for Bactrim DS.  Cb# [phone].  Please advise.  Thank you.

## 2020-07-26 NOTE — Progress Notes (Signed)
Notified Dr. Erlinda Hong and Mendel Ryder regarding abnormal labs and UA.

## 2020-07-26 NOTE — Progress Notes (Signed)
CRITICAL VALUE STICKER  CRITICAL VALUE: K+ 2.7  RECEIVER (on-site recipient of call): Geanie Berlin, RN  DATE & TIME NOTIFIED: 07/26/20 @1517   MESSENGER (representative from lab): Liliane Bade  MD NOTIFIED: Dr. Erlinda Hong and [place]: 1522  RESPONSE:

## 2020-07-26 NOTE — Progress Notes (Signed)
PCP - Geographical information systems officer - patient denies  PPM/ICD - n/a Device Orders -  Rep Notified -   Chest x-ray - 07/26/20 EKG - 08/11/19 Stress Test - patient denies ECHO - 2013 Cardiac Cath - patient denies  Sleep Study - patient denies CPAP -   Fasting Blood Sugar - patient unsure Checks Blood Sugar - checks only when feeling bad  Blood Thinner Instructions: n/a Aspirin Instructions: n/a  ERAS Protcol - clears until 0415 PRE-SURGERY Ensure or G2- water given to patient at PAT  COVID TEST- at PAT appointment 07/30/20   Anesthesia review: yes, history of echo,   Patient denies shortness of breath, fever, cough and chest pain at PAT appointment   All instructions explained to the patient, with a verbal understanding of the material. Patient agrees to go over the instructions while at home for a better understanding. Patient also instructed to self quarantine after being tested for COVID-19. The opportunity to ask questions was provided.

## 2020-07-27 ENCOUNTER — Other Ambulatory Visit: Payer: Self-pay | Admitting: Physician Assistant

## 2020-07-27 ENCOUNTER — Telehealth: Payer: Self-pay | Admitting: Orthopaedic Surgery

## 2020-07-27 MED ORDER — TRANEXAMIC ACID-NACL 1000-0.7 MG/100ML-% IV SOLN
1000.0000 mg | INTRAVENOUS | Status: AC
  Administered 2020-07-30: 1000 mg via INTRAVENOUS
  Filled 2020-07-27 (×2): qty 100

## 2020-07-27 MED ORDER — CIPROFLOXACIN HCL 500 MG PO TABS: 500.0000 mg | ORAL_TABLET | Freq: Two times a day (BID) | ORAL | 0 refills | Status: DC

## 2020-07-27 MED ORDER — TRANEXAMIC ACID 1000 MG/10ML IV SOLN
2000.0000 mg | INTRAVENOUS | Status: DC
  Filled 2020-07-27 (×2): qty 20

## 2020-07-27 MED ORDER — CEPHALEXIN 500 MG PO CAPS: 500.0000 mg | ORAL_CAPSULE | Freq: Four times a day (QID) | ORAL | 0 refills | Status: DC

## 2020-07-27 NOTE — Telephone Encounter (Signed)
Called pharm. Cx Bactrim. 2 other abx sent to pharm. Also, Potassium is low per Mendel Ryder. Rx sent to her pharm.

## 2020-07-27 NOTE — Telephone Encounter (Signed)
I have called patient twice, no answer and left VM.  I will try to call again. I only work half day today.  If she doesn't answer do you mind calling again this afternoon please. Thank you.

## 2020-07-27 NOTE — Telephone Encounter (Signed)
We are in surgery this afternoon.  Can one of you guys try calling this patient again?  Her potassium is extremely low and she needs to start meds that we have called in asap!

## 2020-07-27 NOTE — Telephone Encounter (Signed)
FYI I was able to talk to the pt and she was informed and stated would pick up the medications

## 2020-07-27 NOTE — Telephone Encounter (Signed)
Called patient again no answer. Left her another VM.

## 2020-07-27 NOTE — Telephone Encounter (Signed)
Talked to pt

## 2020-07-27 NOTE — Telephone Encounter (Signed)
LVM informing pt. Told her to call back to let us know she received this message

## 2020-07-27 NOTE — Progress Notes (Signed)
Anesthesia Chart Review:  Case: [id number] Date/Time: 07/30/20 0700   Procedure: LEFT TOTAL KNEE ARTHROPLASTY (Left Knee)   Anesthesia type: Spinal   Pre-op diagnosis: left knee osteoarthritis   Location: [facility] OR ROOM 04 / [place] OR   Surgeons: Leandrew Koyanagi, MD      DISCUSSION: Patient is a 75 year old female scheduled for the above procedure. Surgery was postponed from November 2021 to allow better DM control.  Surgery rescheduled after follow-up A1c 8.5%-->7.7% on 06/20/20 with PCP Riesa Pope, MD.  History includes never smoker, HTN, HLD, nonalcoholic fatty liver disease, DM2 (with retinopathy), anemia, breast cancer (s/p modified radical mastectomy, placement left SCV Port-a-cath 12/04/10-09/24/11), chemo-induced neuropathy, renal lesion (not noted on 09/27/16 CT), TKR (right 08/16/12, requiring I&D/revision 09/03/12 for infection).   Ortho has called antibiotics for abnormal UA and potassium for hypokalemia. ISTAT8 for day of surgery to recheck K+.  07/26/20 presurgical COVID-19 test negative. 07/26/20 CXR in process as of 07/28/19 4:30 PM. Anesthesia team to evaluate on the day of surgery.    VS: BP (!) 172/79   Pulse 81   Temp 36.7 C (Oral)   Resp 18   Ht 4\' 11"  (1.499 m)   Wt 69.1 kg   SpO2 100%   BMI 30.76 kg/m    PROVIDERS: Riesa Pope, MD is PCP who is aware of surgery plans. (Re-evaluated to get DM better controlled for surgery.)   LABS: Preoperative labs noted. SEE DISCUSSION. A1c 7.7% 06/20/20, down from 8.5% on 03/20/20.  (all labs ordered are listed, but only abnormal results are displayed)  Labs Reviewed  GLUCOSE, CAPILLARY - Abnormal; Notable for the following components:      Result Value   Glucose-Capillary 184 (*)    All other components within normal limits  CBC WITH DIFFERENTIAL/PLATELET - Abnormal; Notable for the following components:   RBC 3.51 (*)    Hemoglobin 11.2 (*)    HCT 33.2 (*)    RDW 16.3 (*)    All other components within normal  limits  COMPREHENSIVE METABOLIC PANEL - Abnormal; Notable for the following components:   Potassium 2.7 (*)    Glucose, Bld 174 (*)    All other components within normal limits  URINALYSIS, ROUTINE W REFLEX MICROSCOPIC - Abnormal; Notable for the following components:   APPearance HAZY (*)    Glucose, UA 50 (*)    Leukocytes,Ua MODERATE (*)    Bacteria, UA FEW (*)    Non Squamous Epithelial 0-5 (*)    All other components within normal limits  SARS CORONAVIRUS 2 (TAT 6-24 HRS)  SURGICAL PCR SCREEN  PROTIME-INR  APTT  TYPE AND SCREEN     IMAGES: CXR 07/06/20: In process.   EKG: 08/11/19: NSR   CV: Echo 03/30/12: Study Conclusions  - Left ventricle: The cavity size was normal. There was mild  concentric hypertrophy. Systolic function was normal. The  estimated ejection fraction was in the range of 60% to  65%. Wall motion was normal; there were no regional wall  motion abnormalities. Doppler parameters are consistent  with abnormal left ventricular relaxation (grade 1  diastolic dysfunction).  - Aortic valve: Trivial regurgitation.  - Atrial septum: No defect or patent foramen ovale was  identified.   Nuclear stress test 03/30/12: IMPRESSION:  1. Interval development of global hypokinesis (ejection fraction  40%), possibly reflecting cardiomyopathy. No focal wall motion  abnormalities identified.  2. Small focus of reversibility in the anterior apical and distal  segments of the septum and lateral  wall suspicious for myocardial  ischemia.  - Testing ordered by cardiologist Shelva Majestic, MD. 03/30/12 echo showed normal LVF with EF 60-65%, no regional wall motion abnormalities. She went on to undergo right TKA 08/16/12 and revision on 09/03/12.   Past Medical History:  Diagnosis Date  . Allergy   . Anemia   . Breast CA ([place])    (Rt) breast ca dx 4/12---  S/P RADICAL MASTECTOMY AND CHEMORADIATION  . Diabetes mellitus ORAL MED  . Fatty liver disease,  nonalcoholic   . History of hyperkalemia   . Hx antineoplastic chemo  10/12 - 06/24/11   PACLITAX EL COMPLETED 06/24/11  . Hyperlipidemia   . Hypertension   . Mild nonproliferative diabetic retinopathy of right eye ([place]) 03/19/11   Dr. Joseph Art  . Numbness and tingling    Hx: of in fingers and toes since chemotherapy  . OA (osteoarthritis) of knee    RIGHT LEG  . Obesity   . Renal lesion     Past Surgical History:  Procedure Laterality Date  . COLONOSCOPY W/ POLYPECTOMY     Hx; of  . I & D KNEE WITH POLY EXCHANGE Right 09/03/2012   Procedure: IRRIGATION AND DEBRIDEMENT RIGHT KNEE WITH POLY EXCHANGE;  Surgeon: Kerin Salen, MD;  Location: [place];  Service: Orthopedics;  Laterality: Right;  . JOINT REPLACEMENT    . knee arthroscopy  10-28-1999   RIGHT  . Laparoscopy with laparoscopic right salpingo oophorectomy and lysis of pelvic and abdominal adhesions  March 2013   Dr. Nori Riis   . MASTECTOMY MODIFIED RADICAL  12-04-2010   W/ LEFT PAC PLACEMENT (RIGHT BREAST W/ AXILLARY CONTENTS/ NODE BX'S  . PORT-A-CATH REMOVAL  09/24/2011   Procedure: REMOVAL PORT-A-CATH;  Surgeon: Joyice Faster. Cornett, MD;  Location: [facility];  Service: General;  Laterality: N/A;  . PORTACATH PLACEMENT     REPLACED PAC DUE TO MALFUNCTION (LEFT)  . TOTAL KNEE ARTHROPLASTY Right 08/16/2012   Procedure: TOTAL KNEE ARTHROPLASTY;  Surgeon: Kerin Salen, MD;  Location: [place];  Service: Orthopedics;  Laterality: Right;  . TRANSTHORACIC ECHOCARDIOGRAM  10-29-2010   NORMAL LVF/ EF 55-60%/ MILD MV REGURG  . TUBAL LIGATION  YRS AGO  . VAGINAL HYSTERECTOMY  AGE 77   W/ LSO    MEDICATIONS: . aspirin EC 81 MG tablet  . docusate sodium (COLACE) 100 MG capsule  . methocarbamol (ROBAXIN) 500 MG tablet  . ondansetron (ZOFRAN) 4 MG tablet  . oxyCODONE-acetaminophen (PERCOCET) 5-325 MG tablet  . Accu-Chek Softclix Lancets lancets  . acetaminophen (TYLENOL) 500 MG tablet  . atorvastatin (LIPITOR) 40 MG tablet  . cephALEXin  (KEFLEX) 500 MG capsule  . chlorthalidone (HYGROTON) 25 MG tablet  . ciprofloxacin (CIPRO) 500 MG tablet  . empagliflozin (JARDIANCE) 10 MG TABS tablet  . fluticasone (FLONASE) 50 MCG/ACT nasal spray  . glucose blood (ACCU-CHEK GUIDE) test strip  . lisinopril (ZESTRIL) 20 MG tablet  . meloxicam (MOBIC) 7.5 MG tablet  . metFORMIN (GLUCOPHAGE-XR) 500 MG 24 hr tablet  . Polyethyl Glycol-Propyl Glycol (SYSTANE) 0.4-0.3 % SOLN  . potassium chloride (KLOR-CON) 10 MEQ tablet   No current facility-administered medications for this encounter.    Myra Gianotti, PA-C Surgical Short Stay/Anesthesiology [facility] [place] Phone [phone] [facility] Phone [phone] 07/27/2020 4:30 PM

## 2020-07-27 NOTE — Telephone Encounter (Signed)
Call #2   No answer. LMOM to return call. Need to advise on message below.

## 2020-07-27 NOTE — Anesthesia Preprocedure Evaluation (Addendum)
Anesthesia Evaluation  Patient identified by MRN, date of birth, ID band Patient awake    Reviewed: Allergy & Precautions, NPO status , Patient's Chart, lab work & pertinent test results  History of Anesthesia Complications Negative for: history of anesthetic complications  Airway Mallampati: III  TM Distance: >3 FB Neck ROM: Full    Dental no notable dental hx.    Pulmonary neg pulmonary ROS,    Pulmonary exam normal        Cardiovascular hypertension, Pt. on medications Normal cardiovascular exam  Echo 03/30/12: EF 60-65%, mild LVH, grade 1 DD    Neuro/Psych negative neurological ROS  negative psych ROS   GI/Hepatic negative GI ROS, Neg liver ROS,   Endo/Other  diabetes, Type 2, Oral Hypoglycemic AgentsK 2.7 on preop labs  Renal/GU   negative genitourinary   Musculoskeletal negative musculoskeletal ROS (+)   Abdominal   Peds  Hematology  (+) anemia , Hgb 11.2    Anesthesia Other Findings Day of surgery medications reviewed with patient.  Reproductive/Obstetrics negative OB ROS                           Anesthesia Physical Anesthesia Plan  ASA: II  Anesthesia Plan: Spinal   Post-op Pain Management:  Regional for Post-op pain   Induction:   PONV Risk Score and Plan: 3 and Treatment may vary due to age or medical condition, Ondansetron, Propofol infusion and Dexamethasone  Airway Management Planned: Natural Airway and Simple Face Mask  Additional Equipment: None  Intra-op Plan:   Post-operative Plan:   Informed Consent: I have reviewed the patients History and Physical, chart, labs and discussed the procedure including the risks, benefits and alternatives for the proposed anesthesia with the patient or authorized representative who has indicated his/her understanding and acceptance.       Plan Discussed with: CRNA  Anesthesia Plan Comments: (PAT note written 07/27/2020  by Myra Gianotti, PA-C. )      Anesthesia Quick Evaluation

## 2020-07-27 NOTE — Telephone Encounter (Signed)
Cancel the bactrim. I have sent in keflex for after surgery

## 2020-07-27 NOTE — Telephone Encounter (Signed)
Cipro sent for UTI

## 2020-07-27 NOTE — Telephone Encounter (Signed)
Pt called and is wondering what medications she should be taking because she picked up 5 from pharmacy for before her surgery. She states she was talking with someone but I don't know who it was.If someone can call her back [phone].

## 2020-07-27 NOTE — Progress Notes (Signed)
Thanks for the heads up.  We have sent in potassium and abx for her to start asap!

## 2020-07-27 NOTE — Telephone Encounter (Signed)
Called patient no answer LMOM.

## 2020-07-30 ENCOUNTER — Ambulatory Visit (HOSPITAL_COMMUNITY): Payer: Medicare Other | Admitting: Vascular Surgery

## 2020-07-30 ENCOUNTER — Observation Stay (HOSPITAL_COMMUNITY): Payer: Medicare Other

## 2020-07-30 ENCOUNTER — Observation Stay (HOSPITAL_COMMUNITY)
Admission: RE | Admit: 2020-07-30 | Discharge: 2020-08-01 | Disposition: A | Discharge status: Home / Self Care | Payer: Medicare Other | Attending: Orthopaedic Surgery | Admitting: Orthopaedic Surgery

## 2020-07-30 ENCOUNTER — Encounter (HOSPITAL_COMMUNITY)
Admission: RE | Disposition: A | Discharge status: Home / Self Care | Payer: Self-pay | Source: Home / Self Care | Attending: Orthopaedic Surgery

## 2020-07-30 ENCOUNTER — Encounter (HOSPITAL_COMMUNITY): Payer: Self-pay | Admitting: Orthopaedic Surgery

## 2020-07-30 ENCOUNTER — Other Ambulatory Visit: Payer: Self-pay

## 2020-07-30 ENCOUNTER — Telehealth: Payer: Self-pay | Admitting: Orthopaedic Surgery

## 2020-07-30 DIAGNOSIS — Z853 Personal history of malignant neoplasm of breast: Secondary | ICD-10-CM | POA: Insufficient documentation

## 2020-07-30 DIAGNOSIS — Z20822 Contact with and (suspected) exposure to covid-19: Secondary | ICD-10-CM | POA: Diagnosis not present

## 2020-07-30 DIAGNOSIS — M1712 Unilateral primary osteoarthritis, left knee: Principal | ICD-10-CM | POA: Diagnosis present

## 2020-07-30 DIAGNOSIS — Z79899 Other long term (current) drug therapy: Secondary | ICD-10-CM | POA: Insufficient documentation

## 2020-07-30 DIAGNOSIS — I10 Essential (primary) hypertension: Secondary | ICD-10-CM | POA: Diagnosis not present

## 2020-07-30 DIAGNOSIS — E669 Obesity, unspecified: Secondary | ICD-10-CM | POA: Insufficient documentation

## 2020-07-30 DIAGNOSIS — R52 Pain, unspecified: Secondary | ICD-10-CM

## 2020-07-30 DIAGNOSIS — E119 Type 2 diabetes mellitus without complications: Secondary | ICD-10-CM | POA: Diagnosis not present

## 2020-07-30 DIAGNOSIS — Z791 Long term (current) use of non-steroidal anti-inflammatories (NSAID): Secondary | ICD-10-CM | POA: Diagnosis not present

## 2020-07-30 DIAGNOSIS — Z96652 Presence of left artificial knee joint: Secondary | ICD-10-CM

## 2020-07-30 DIAGNOSIS — E785 Hyperlipidemia, unspecified: Secondary | ICD-10-CM | POA: Insufficient documentation

## 2020-07-30 DIAGNOSIS — D649 Anemia, unspecified: Secondary | ICD-10-CM | POA: Diagnosis not present

## 2020-07-30 HISTORY — PX: TOTAL KNEE ARTHROPLASTY: SHX125

## 2020-07-30 HISTORY — PX: OTHER SURGICAL HISTORY: SHX169

## 2020-07-30 HISTORY — DX: Presence of left artificial knee joint: Z96.652

## 2020-07-30 LAB — GLUCOSE, CAPILLARY
Glucose-Capillary: 114 mg/dL — ABNORMAL HIGH (ref 70–99)
Glucose-Capillary: 130 mg/dL — ABNORMAL HIGH (ref 70–99)
Glucose-Capillary: 115 mg/dL — ABNORMAL HIGH (ref 70–99)
Glucose-Capillary: 118 mg/dL — ABNORMAL HIGH (ref 70–99)
Glucose-Capillary: 134 mg/dL — ABNORMAL HIGH (ref 70–99)

## 2020-07-30 LAB — POCT I-STAT, CHEM 8
BUN: 10 mg/dL (ref 8–23)
Calcium, Ion: 1.15 mmol/L (ref 1.15–1.40)
Chloride: 103 mmol/L (ref 98–111)
Creatinine, Ser: 0.7 mg/dL (ref 0.44–1.00)
Glucose, Bld: 127 mg/dL — ABNORMAL HIGH (ref 70–99)
HCT: 34 % — ABNORMAL LOW (ref 36.0–46.0)
Hemoglobin: 11.6 g/dL — ABNORMAL LOW (ref 12.0–15.0)
Potassium: 3.4 mmol/L — ABNORMAL LOW (ref 3.5–5.1)
Sodium: 143 mmol/L (ref 135–145)
TCO2: 26 mmol/L (ref 22–32)

## 2020-07-30 SURGERY — ARTHROPLASTY, KNEE, TOTAL
Anesthesia: Spinal | Site: Knee | Laterality: Left

## 2020-07-30 MED ORDER — CEFAZOLIN SODIUM-DEXTROSE 2-4 GM/100ML-% IV SOLN
2.0000 g | INTRAVENOUS | Status: AC
  Administered 2020-07-30: 2 g via INTRAVENOUS
  Filled 2020-07-30: qty 100

## 2020-07-30 MED ORDER — BUPIVACAINE-MELOXICAM ER 200-6 MG/7ML IJ SOLN
INTRAMUSCULAR | Status: AC
  Filled 2020-07-30: qty 2

## 2020-07-30 MED ORDER — CEFAZOLIN SODIUM-DEXTROSE 2-4 GM/100ML-% IV SOLN
2.0000 g | Freq: Four times a day (QID) | INTRAVENOUS | Status: AC
  Administered 2020-07-30 (×2): 2 g via INTRAVENOUS
  Filled 2020-07-30 (×2): qty 100

## 2020-07-30 MED ORDER — FENTANYL CITRATE (PF) 250 MCG/5ML IJ SOLN
INTRAMUSCULAR | Status: AC
  Filled 2020-07-30: qty 5

## 2020-07-30 MED ORDER — DOCUSATE SODIUM 100 MG PO CAPS
100.0000 mg | ORAL_CAPSULE | Freq: Two times a day (BID) | ORAL | Status: DC
  Administered 2020-07-30 – 2020-08-01 (×4): 100 mg via ORAL
  Filled 2020-07-30 (×4): qty 1

## 2020-07-30 MED ORDER — METHOCARBAMOL 500 MG PO TABS
ORAL_TABLET | ORAL | Status: AC
  Filled 2020-07-30: qty 1

## 2020-07-30 MED ORDER — SODIUM CHLORIDE 0.9 % IR SOLN
Status: DC | PRN
  Administered 2020-07-30: 3000 mL

## 2020-07-30 MED ORDER — METHOCARBAMOL 500 MG PO TABS
500.0000 mg | ORAL_TABLET | Freq: Four times a day (QID) | ORAL | Status: DC | PRN
  Administered 2020-07-30 (×2): 500 mg via ORAL
  Filled 2020-07-30: qty 1

## 2020-07-30 MED ORDER — METOCLOPRAMIDE HCL 5 MG/ML IJ SOLN: 5.0000 mg | Freq: Three times a day (TID) | INTRAMUSCULAR | Status: DC | PRN

## 2020-07-30 MED ORDER — CHLORTHALIDONE 25 MG PO TABS
25.0000 mg | ORAL_TABLET | Freq: Every day | ORAL | Status: DC
  Administered 2020-07-30 – 2020-07-31 (×2): 25 mg via ORAL
  Filled 2020-07-30 (×4): qty 1

## 2020-07-30 MED ORDER — HYDROMORPHONE HCL 1 MG/ML IJ SOLN
0.5000 mg | INTRAMUSCULAR | Status: DC | PRN
  Administered 2020-07-30: 1 mg via INTRAVENOUS
  Filled 2020-07-30: qty 1

## 2020-07-30 MED ORDER — OXYCODONE HCL 5 MG PO TABS
5.0000 mg | ORAL_TABLET | ORAL | Status: DC | PRN
  Administered 2020-07-30 – 2020-07-31 (×3): 10 mg via ORAL
  Administered 2020-08-01: 5 mg via ORAL
  Administered 2020-08-01: 10 mg via ORAL
  Filled 2020-07-30 (×3): qty 2
  Filled 2020-07-30: qty 1

## 2020-07-30 MED ORDER — ALUM & MAG HYDROXIDE-SIMETH 200-200-20 MG/5ML PO SUSP: 30.0000 mL | ORAL | Status: DC | PRN

## 2020-07-30 MED ORDER — LACTATED RINGERS IV SOLN: INTRAVENOUS | Status: DC | PRN

## 2020-07-30 MED ORDER — PHENOL 1.4 % MT LIQD: 1.0000 | OROMUCOSAL | Status: DC | PRN

## 2020-07-30 MED ORDER — SODIUM CHLORIDE 0.9 % IV SOLN: INTRAVENOUS | Status: DC

## 2020-07-30 MED ORDER — CLONIDINE HCL (ANALGESIA) 100 MCG/ML EP SOLN
EPIDURAL | Status: DC | PRN
  Administered 2020-07-30: 100 ug

## 2020-07-30 MED ORDER — ASPIRIN 81 MG PO CHEW
81.0000 mg | CHEWABLE_TABLET | Freq: Two times a day (BID) | ORAL | Status: DC
  Administered 2020-07-30 – 2020-08-01 (×4): 81 mg via ORAL
  Filled 2020-07-30 (×4): qty 1

## 2020-07-30 MED ORDER — VANCOMYCIN HCL 1000 MG IV SOLR
INTRAVENOUS | Status: AC
  Filled 2020-07-30: qty 1000

## 2020-07-30 MED ORDER — LISINOPRIL 20 MG PO TABS
20.0000 mg | ORAL_TABLET | Freq: Every day | ORAL | Status: DC
  Administered 2020-07-30 – 2020-08-01 (×3): 20 mg via ORAL
  Filled 2020-07-30 (×3): qty 1

## 2020-07-30 MED ORDER — MAGNESIUM CITRATE PO SOLN: 1.0000 | Freq: Once | ORAL | Status: DC | PRN

## 2020-07-30 MED ORDER — POTASSIUM CHLORIDE CRYS ER 10 MEQ PO TBCR
10.0000 meq | EXTENDED_RELEASE_TABLET | Freq: Three times a day (TID) | ORAL | Status: DC
  Administered 2020-07-30 – 2020-08-01 (×6): 10 meq via ORAL
  Filled 2020-07-30 (×9): qty 1

## 2020-07-30 MED ORDER — METOCLOPRAMIDE HCL 5 MG PO TABS: 5.0000 mg | ORAL_TABLET | Freq: Three times a day (TID) | ORAL | Status: DC | PRN

## 2020-07-30 MED ORDER — VANCOMYCIN HCL 1000 MG IV SOLR
INTRAVENOUS | Status: DC | PRN
  Administered 2020-07-30: 1000 mg via TOPICAL

## 2020-07-30 MED ORDER — LACTATED RINGERS IV SOLN: INTRAVENOUS | Status: DC

## 2020-07-30 MED ORDER — PHENYLEPHRINE HCL-NACL 10-0.9 MG/250ML-% IV SOLN
INTRAVENOUS | Status: DC | PRN
  Administered 2020-07-30: 25 ug/min via INTRAVENOUS

## 2020-07-30 MED ORDER — FENTANYL CITRATE (PF) 100 MCG/2ML IJ SOLN
INTRAMUSCULAR | Status: AC
  Filled 2020-07-30: qty 2

## 2020-07-30 MED ORDER — PROMETHAZINE HCL 25 MG/ML IJ SOLN: 6.2500 mg | INTRAMUSCULAR | Status: DC | PRN

## 2020-07-30 MED ORDER — ACETAMINOPHEN 325 MG PO TABS
325.0000 mg | ORAL_TABLET | Freq: Four times a day (QID) | ORAL | Status: DC | PRN
  Filled 2020-07-30: qty 2

## 2020-07-30 MED ORDER — PANTOPRAZOLE SODIUM 40 MG PO TBEC
40.0000 mg | DELAYED_RELEASE_TABLET | Freq: Every day | ORAL | Status: DC
  Administered 2020-07-30 – 2020-08-01 (×3): 40 mg via ORAL
  Filled 2020-07-30 (×3): qty 1

## 2020-07-30 MED ORDER — POLYETHYLENE GLYCOL 3350 17 G PO PACK
17.0000 g | PACK | Freq: Every day | ORAL | Status: DC
  Administered 2020-07-31 – 2020-08-01 (×2): 17 g via ORAL
  Filled 2020-07-30 (×2): qty 1

## 2020-07-30 MED ORDER — ACETAMINOPHEN 500 MG PO TABS
1000.0000 mg | ORAL_TABLET | Freq: Four times a day (QID) | ORAL | Status: AC
  Administered 2020-07-30 – 2020-07-31 (×3): 1000 mg via ORAL
  Filled 2020-07-30 (×3): qty 2

## 2020-07-30 MED ORDER — ACETAMINOPHEN 500 MG PO TABS
1000.0000 mg | ORAL_TABLET | Freq: Once | ORAL | Status: AC
  Administered 2020-07-30: 1000 mg via ORAL
  Filled 2020-07-30: qty 2

## 2020-07-30 MED ORDER — MIDAZOLAM HCL 2 MG/2ML IJ SOLN
INTRAMUSCULAR | Status: AC
  Filled 2020-07-30: qty 2

## 2020-07-30 MED ORDER — POVIDONE-IODINE 10 % EX SWAB: 2.0000 "application " | Freq: Once | CUTANEOUS | Status: DC

## 2020-07-30 MED ORDER — IRRISEPT - 450ML BOTTLE WITH 0.05% CHG IN STERILE WATER, USP 99.95% OPTIME
TOPICAL | Status: DC | PRN
  Administered 2020-07-30: 450 mL via TOPICAL

## 2020-07-30 MED ORDER — TRANEXAMIC ACID-NACL 1000-0.7 MG/100ML-% IV SOLN
1000.0000 mg | Freq: Once | INTRAVENOUS | Status: AC
  Administered 2020-07-30: 1000 mg via INTRAVENOUS
  Filled 2020-07-30: qty 100

## 2020-07-30 MED ORDER — FENTANYL CITRATE (PF) 100 MCG/2ML IJ SOLN
25.0000 ug | INTRAMUSCULAR | Status: DC | PRN
  Administered 2020-07-30: 50 ug via INTRAVENOUS

## 2020-07-30 MED ORDER — ONDANSETRON HCL 4 MG PO TABS: 4.0000 mg | ORAL_TABLET | Freq: Four times a day (QID) | ORAL | Status: DC | PRN

## 2020-07-30 MED ORDER — OXYCODONE HCL 5 MG/5ML PO SOLN: 5.0000 mg | Freq: Once | ORAL | Status: DC | PRN

## 2020-07-30 MED ORDER — FENTANYL CITRATE (PF) 250 MCG/5ML IJ SOLN
INTRAMUSCULAR | Status: DC | PRN
  Administered 2020-07-30 (×3): 50 ug via INTRAVENOUS

## 2020-07-30 MED ORDER — DIPHENHYDRAMINE HCL 12.5 MG/5ML PO ELIX
25.0000 mg | ORAL_SOLUTION | ORAL | Status: DC | PRN
  Filled 2020-07-30: qty 10

## 2020-07-30 MED ORDER — CIPROFLOXACIN HCL 500 MG PO TABS
500.0000 mg | ORAL_TABLET | Freq: Two times a day (BID) | ORAL | Status: AC
  Administered 2020-07-30 (×2): 500 mg via ORAL
  Filled 2020-07-30 (×3): qty 1

## 2020-07-30 MED ORDER — ONDANSETRON HCL 4 MG/2ML IJ SOLN
4.0000 mg | Freq: Four times a day (QID) | INTRAMUSCULAR | Status: DC | PRN
  Administered 2020-07-30 – 2020-07-31 (×3): 4 mg via INTRAVENOUS
  Filled 2020-07-30 (×3): qty 2

## 2020-07-30 MED ORDER — BUPIVACAINE-MELOXICAM ER 400-12 MG/14ML IJ SOLN
INTRAMUSCULAR | Status: AC
  Filled 2020-07-30: qty 1

## 2020-07-30 MED ORDER — ONDANSETRON HCL 4 MG/2ML IJ SOLN
INTRAMUSCULAR | Status: DC | PRN
  Administered 2020-07-30: 4 mg via INTRAVENOUS

## 2020-07-30 MED ORDER — BUPIVACAINE-EPINEPHRINE (PF) 0.5% -1:200000 IJ SOLN
INTRAMUSCULAR | Status: DC | PRN
Start: 2020-07-30 — End: 2020-07-30
  Administered 2020-07-30: 15 mL via PERINEURAL

## 2020-07-30 MED ORDER — INSULIN ASPART 100 UNIT/ML ~~LOC~~ SOLN
0.0000 [IU] | Freq: Every day | SUBCUTANEOUS | Status: DC
  Administered 2020-08-01: 1 [IU] via SUBCUTANEOUS

## 2020-07-30 MED ORDER — TRANEXAMIC ACID 1000 MG/10ML IV SOLN
INTRAVENOUS | Status: DC | PRN
  Administered 2020-07-30: 2000 mg via TOPICAL

## 2020-07-30 MED ORDER — METHOCARBAMOL 1000 MG/10ML IJ SOLN
500.0000 mg | Freq: Four times a day (QID) | INTRAVENOUS | Status: DC | PRN
  Filled 2020-07-30: qty 5

## 2020-07-30 MED ORDER — MIDAZOLAM HCL 5 MG/5ML IJ SOLN
INTRAMUSCULAR | Status: DC | PRN
  Administered 2020-07-30 (×2): .5 mg via INTRAVENOUS

## 2020-07-30 MED ORDER — BUPIVACAINE-MELOXICAM ER 400-12 MG/14ML IJ SOLN
INTRAMUSCULAR | Status: DC | PRN
  Administered 2020-07-30 (×2): 200 mg

## 2020-07-30 MED ORDER — CHLORHEXIDINE GLUCONATE 0.12 % MT SOLN
15.0000 mL | Freq: Once | OROMUCOSAL | Status: AC
  Administered 2020-07-30: 15 mL via OROMUCOSAL
  Filled 2020-07-30: qty 15

## 2020-07-30 MED ORDER — INSULIN ASPART 100 UNIT/ML ~~LOC~~ SOLN
0.0000 [IU] | Freq: Three times a day (TID) | SUBCUTANEOUS | Status: DC
  Administered 2020-07-31: 3 [IU] via SUBCUTANEOUS
  Administered 2020-07-31 – 2020-08-01 (×3): 2 [IU] via SUBCUTANEOUS

## 2020-07-30 MED ORDER — OXYCODONE HCL 5 MG PO TABS
5.0000 mg | ORAL_TABLET | Freq: Once | ORAL | Status: DC | PRN
Start: 2020-07-30 — End: 2020-07-30

## 2020-07-30 MED ORDER — 0.9 % SODIUM CHLORIDE (POUR BTL) OPTIME
TOPICAL | Status: DC | PRN
  Administered 2020-07-30: 1000 mL

## 2020-07-30 MED ORDER — PROPOFOL 10 MG/ML IV BOLUS
INTRAVENOUS | Status: AC
  Filled 2020-07-30: qty 40

## 2020-07-30 MED ORDER — BUPIVACAINE IN DEXTROSE 0.75-8.25 % IT SOLN
INTRATHECAL | Status: DC | PRN
  Administered 2020-07-30: 1.4 mL via INTRATHECAL

## 2020-07-30 MED ORDER — PROPOFOL 500 MG/50ML IV EMUL
INTRAVENOUS | Status: DC | PRN
  Administered 2020-07-30: 50 ug/kg/min via INTRAVENOUS

## 2020-07-30 MED ORDER — OXYCODONE HCL 5 MG PO TABS: 10.0000 mg | ORAL_TABLET | ORAL | Status: DC | PRN

## 2020-07-30 MED ORDER — MENTHOL 3 MG MT LOZG
1.0000 | LOZENGE | OROMUCOSAL | Status: DC | PRN
Start: 2020-07-30 — End: 2020-08-01

## 2020-07-30 MED ORDER — SORBITOL 70 % SOLN
30.0000 mL | Freq: Every day | Status: DC | PRN
  Filled 2020-07-30: qty 30

## 2020-07-30 MED ORDER — OXYCODONE HCL 5 MG PO TABS
ORAL_TABLET | ORAL | Status: AC
  Filled 2020-07-30: qty 2

## 2020-07-30 MED ORDER — BUPIVACAINE HCL (PF) 0.25 % IJ SOLN
INTRAMUSCULAR | Status: AC
  Filled 2020-07-30: qty 30

## 2020-07-30 MED ORDER — ORAL CARE MOUTH RINSE: 15.0000 mL | Freq: Once | OROMUCOSAL | Status: AC

## 2020-07-30 SURGICAL SUPPLY — 81 items
ADH SKN CLS APL DERMABOND .7 (GAUZE/BANDAGES/DRESSINGS) ×1
ALCOHOL 70% 16 OZ (MISCELLANEOUS) ×2 IMPLANT
BAG DECANTER FOR FLEXI CONT (MISCELLANEOUS) ×2 IMPLANT
BANDAGE ESMARK 6X9 LF (GAUZE/BANDAGES/DRESSINGS) IMPLANT
BLADE SAG 18X100X1.27 (BLADE) ×2 IMPLANT
BNDG CMPR 9X6 STRL LF SNTH (GAUZE/BANDAGES/DRESSINGS)
BNDG ESMARK 6X9 LF (GAUZE/BANDAGES/DRESSINGS)
BOWL SMART MIX CTS (DISPOSABLE) ×2 IMPLANT
BSPLAT TIB 5D D CMNT STM LT (Knees) ×1 IMPLANT
CEMENT BONE REFOBACIN R1X40 US (Cement) ×2 IMPLANT
CLSR STERI-STRIP ANTIMIC 1/2X4 (GAUZE/BANDAGES/DRESSINGS) ×4 IMPLANT
COOLER ICEMAN CLASSIC (MISCELLANEOUS) ×2 IMPLANT
COVER SURGICAL LIGHT HANDLE (MISCELLANEOUS) ×2 IMPLANT
COVER WAND RF STERILE (DRAPES) IMPLANT
CUFF TOURN SGL QUICK 34 (TOURNIQUET CUFF) ×2
CUFF TOURN SGL QUICK 42 (TOURNIQUET CUFF) IMPLANT
CUFF TRNQT CYL 34X4.125X (TOURNIQUET CUFF) ×1 IMPLANT
DERMABOND ADVANCED (GAUZE/BANDAGES/DRESSINGS) ×1
DERMABOND ADVANCED .7 DNX12 (GAUZE/BANDAGES/DRESSINGS) ×1 IMPLANT
DRAPE EXTREMITY T 121X128X90 (DISPOSABLE) ×2 IMPLANT
DRAPE HALF SHEET 40X57 (DRAPES) ×2 IMPLANT
DRAPE INCISE IOBAN 66X45 STRL (DRAPES) IMPLANT
DRAPE ORTHO SPLIT 77X108 STRL (DRAPES) ×4
DRAPE POUCH INSTRU U-SHP 10X18 (DRAPES) ×2 IMPLANT
DRAPE SURG ORHT 6 SPLT 77X108 (DRAPES) ×2 IMPLANT
DRAPE U-SHAPE 47X51 STRL (DRAPES) ×4 IMPLANT
DRSG AQUACEL AG ADV 3.5X10 (GAUZE/BANDAGES/DRESSINGS) ×2 IMPLANT
DURAPREP 26ML APPLICATOR (WOUND CARE) ×6 IMPLANT
ELECT CAUTERY BLADE 6.4 (BLADE) ×2 IMPLANT
ELECT REM PT RETURN 9FT ADLT (ELECTROSURGICAL) ×2
ELECTRODE REM PT RTRN 9FT ADLT (ELECTROSURGICAL) ×1 IMPLANT
GLOVE ECLIPSE 7.0 STRL STRAW (GLOVE) ×6 IMPLANT
GLOVE SKINSENSE NS SZ7.5 (GLOVE) ×3
GLOVE SKINSENSE STRL SZ7.5 (GLOVE) ×3 IMPLANT
GLOVE SURG SYN 7.5  E (GLOVE) ×8
GLOVE SURG SYN 7.5 E (GLOVE) ×4 IMPLANT
GLOVE SURG SYN 7.5 PF PI (GLOVE) ×4 IMPLANT
GLOVE SURG UNDER POLY LF SZ7 (GLOVE) ×2 IMPLANT
GOWN STRL REIN XL XLG (GOWN DISPOSABLE) ×2 IMPLANT
GOWN STRL REUS W/ TWL LRG LVL3 (GOWN DISPOSABLE) ×1 IMPLANT
GOWN STRL REUS W/TWL LRG LVL3 (GOWN DISPOSABLE) ×2
HANDPIECE INTERPULSE COAX TIP (DISPOSABLE) ×2
HDLS TROCR DRIL PIN KNEE 75 (PIN) ×2
HOOD PEEL AWAY FLYTE STAYCOOL (MISCELLANEOUS) ×4 IMPLANT
INSERT ARTISURF SZ 6-7 LT (Insert) ×1 IMPLANT
JET LAVAGE IRRISEPT WOUND (IRRIGATION / IRRIGATOR) ×2
KIT BASIN OR (CUSTOM PROCEDURE TRAY) ×2 IMPLANT
KIT TURNOVER KIT B (KITS) ×2 IMPLANT
KNEE SYSTEM FEMUR SZ 6 LT (Knees) ×1 IMPLANT
LAVAGE JET IRRISEPT WOUND (IRRIGATION / IRRIGATOR) ×1 IMPLANT
MANIFOLD NEPTUNE II (INSTRUMENTS) ×2 IMPLANT
MARKER SKIN DUAL TIP RULER LAB (MISCELLANEOUS) ×2 IMPLANT
NDL SPNL 18GX3.5 QUINCKE PK (NEEDLE) ×2 IMPLANT
NEEDLE SPNL 18GX3.5 QUINCKE PK (NEEDLE) ×4 IMPLANT
NS IRRIG 1000ML POUR BTL (IV SOLUTION) ×2 IMPLANT
PACK TOTAL JOINT (CUSTOM PROCEDURE TRAY) ×2 IMPLANT
PAD ARMBOARD 7.5X6 YLW CONV (MISCELLANEOUS) ×4 IMPLANT
PAD COLD SHLDR WRAP-ON (PAD) ×2 IMPLANT
PIN DRILL HDLS TROCAR 75 4PK (PIN) IMPLANT
SAW OSC TIP CART 19.5X105X1.3 (SAW) ×3 IMPLANT
SCREW FEMALE HEX FIX 25X2.5 (ORTHOPEDIC DISPOSABLE SUPPLIES) ×1 IMPLANT
SET HNDPC FAN SPRY TIP SCT (DISPOSABLE) ×1 IMPLANT
STAPLER VISISTAT 35W (STAPLE) IMPLANT
STEM POLY PAT PLY 29M KNEE (Knees) ×1 IMPLANT
STEM TIBIA 5 DEG SZ D L KNEE (Knees) IMPLANT
SUCTION FRAZIER HANDLE 10FR (MISCELLANEOUS) ×2
SUCTION TUBE FRAZIER 10FR DISP (MISCELLANEOUS) ×1 IMPLANT
SUT ETHILON 2 0 FS 18 (SUTURE) IMPLANT
SUT MNCRL AB 4-0 PS2 18 (SUTURE) IMPLANT
SUT VIC AB 0 CT1 27 (SUTURE) ×4
SUT VIC AB 0 CT1 27XBRD ANBCTR (SUTURE) ×2 IMPLANT
SUT VIC AB 1 CTX 27 (SUTURE) ×6 IMPLANT
SUT VIC AB 2-0 CT1 27 (SUTURE) ×8
SUT VIC AB 2-0 CT1 TAPERPNT 27 (SUTURE) ×4 IMPLANT
SYR 50ML LL SCALE MARK (SYRINGE) ×4 IMPLANT
TIBIA STEM 5 DEG SZ D L KNEE (Knees) ×2 IMPLANT
TOWEL GREEN STERILE (TOWEL DISPOSABLE) ×2 IMPLANT
TOWEL GREEN STERILE FF (TOWEL DISPOSABLE) ×2 IMPLANT
TRAY CATH 16FR W/PLASTIC CATH (SET/KITS/TRAYS/PACK) IMPLANT
UNDERPAD 30X36 HEAVY ABSORB (UNDERPADS AND DIAPERS) ×2 IMPLANT
WRAP KNEE MAXI GEL POST OP (GAUZE/BANDAGES/DRESSINGS) ×2 IMPLANT

## 2020-07-30 NOTE — Op Note (Signed)
Total Knee Arthroplasty Procedure Note  Preoperative diagnosis: Left knee osteoarthritis  Postoperative diagnosis:same  Operative procedure: Left total knee arthroplasty. CPT 419 [id number]  Surgeon: N. Eduard Roux, MD  Assist: None  Anesthesia: Spinal, regional, local  Tourniquet time: see anesthesia record  Implants used: Zimmer persona Femur: CR 6 Tibia: D Patella: 29 mm Polyethylene: 10 mm, MC  Indication: [name] is a 75 y.o. year old female with a history of knee pain. Having failed conservative management, the patient elected to proceed with a total knee arthroplasty.  We have reviewed the risk and benefits of the surgery and they elected to proceed after voicing understanding.  Procedure:  After informed consent was obtained and understanding of the risk were voiced including but not limited to bleeding, infection, damage to surrounding structures including nerves and vessels, blood clots, leg length inequality and the failure to achieve desired results, the operative extremity was marked with verbal confirmation of the patient in the holding area.   The patient was then brought to the operating room and transported to the operating room table in the supine position.  A tourniquet was applied to the operative extremity around the upper thigh. The operative limb was then prepped and draped in the usual sterile fashion and preoperative antibiotics were administered.  A time out was performed prior to the start of surgery confirming the correct extremity, preoperative antibiotic administration, as well as team members, implants and instruments available for the case. Correct surgical site was also confirmed with preoperative radiographs. The limb was then elevated for exsanguination and the tourniquet was inflated. A midline incision was made and a standard medial parapatellar approach was performed.  The infrapatellar fat pad was removed.  Suprapatellar synovium was removed to  reveal the anterior distal femoral cortex.  A medial peel was performed to release the capsule of the medial tibial plateau.  The patella was then everted and was prepared and sized to a 29 mm.  A cover was placed on the patella for protection from retractors.  The knee was then brought into full flexion and we then turned our attention to the femur.  The cruciates were sacrificed.  Start site was drilled in the femur and the intramedullary distal femoral cutting guide was placed, set at 5 degrees valgus, taking 10 mm of distal resection. The distal cut was made. Osteophytes were then removed.  Next, the proximal tibial cutting guide was placed with appropriate slope, varus/valgus alignment and depth of resection. The proximal tibial cut was made. Gap blocks were then used to assess the extension gap and alignment, and appropriate soft tissue releases were performed. Attention was turned back to the femur, which was sized using the sizing guide to a size 6. Appropriate rotation of the femoral component was determined using epicondylar axis, Whiteside's line, and assessing the flexion gap under ligament tension. The appropriate size 4-in-1 cutting block was placed and checked with an angel wing and cuts were made. Posterior femoral osteophytes and uncapped bone were then removed with the curved osteotome.  Trial components were placed, and stability was checked in full extension, mid-flexion, and deep flexion. Proper tibial rotation was determined and marked.  The patella tracked well without a lateral release.  The femoral lugs were then drilled. Trial components were then removed and tibial preparation performed.  The tibia was sized for a size D component.   The bony surfaces were irrigated with a pulse lavage and then dried. Bone cement was vacuum mixed on  the back table, and the final components sized above were cemented into place.  Antibiotic irrigation was placed in the knee joint and soft tissues while  the cement cured.  After cement had finished curing, excess cement was removed. The stability of the construct was re-evaluated throughout a range of motion and found to be acceptable. The trial liner was removed, the knee was copiously irrigated, and the knee was re-evaluated for any excess bone debris. The real polyethylene liner, 10 mm thick, was inserted and checked to ensure the locking mechanism had engaged appropriately. The tourniquet was deflated and hemostasis was achieved. The wound was irrigated with normal saline.  One gram of vancomycin powder was placed in the surgical bed.  Capsular closure was performed with a #1 vicryl, subcutaneous fat closed with a 0 vicryl suture, then subcutaneous tissue closed with interrupted 2.0 vicryl suture. The skin was then closed with a 2.0 nylon and dermabond. A sterile dressing was applied.  The patient was awakened in the operating room and taken to recovery in stable condition. All sponge, needle, and instrument counts were correct at the end of the case.  Position: supine  Complications: none.  Time Out: performed   Drains/Packing: none  Estimated blood loss: minimal  Returned to Recovery Room: in good condition.   Antibiotics: yes   Mechanical VTE (DVT) Prophylaxis: sequential compression devices, TED thigh-high  Chemical VTE (DVT) Prophylaxis: aspirin  Fluid Replacement  Crystalloid: see anesthesia record Blood: none  FFP: none   Specimens Removed: 1 to pathology   Sponge and Instrument Count Correct? yes   PACU: portable radiograph - knee AP and Lateral   Plan/RTC: Return in 2 weeks for wound check.   Weight Bearing/Load Lower Extremity: full   Implant Name Type Inv. Item Serial No. Manufacturer Lot No. LRB No. Used Action  CEMENT BONE REFOBACIN [id number] [place] - [id number] Cement CEMENT BONE REFOBACIN [id number] [place]  ZIMMER RECON(ORTH,TRAU,BIO,SG)  Left 2 Implanted  INSERT ARTISURF SZ 6-7 LT - [id number] Insert INSERT ARTISURF SZ 6-7 LT   ZIMMER RECON(ORTH,TRAU,BIO,SG) [id number] Left 1 Implanted  STEM POLY PAT PLY 40M KNEE - [id number] Knees STEM POLY PAT PLY 40M KNEE  ZIMMER RECON(ORTH,TRAU,BIO,SG) [id number] Left 1 Implanted  KNEE SYSTEM FEMUR SZ 6 LT - [id number] Knees KNEE SYSTEM FEMUR SZ 6 LT  ZIMMER RECON(ORTH,TRAU,BIO,SG) [id number] Left 1 Implanted  TIBIA STEM 5 DEG SZ D L KNEE - [id number] Knees TIBIA STEM 5 DEG SZ D L KNEE  ZIMMER RECON(ORTH,TRAU,BIO,SG) [id number] Left 1 Implanted    N. Eduard Roux, MD [facility] 9:21 AM

## 2020-07-30 NOTE — Anesthesia Postprocedure Evaluation (Signed)
Anesthesia Post Note  Patient: [name]  Procedure(s) Performed: LEFT TOTAL KNEE ARTHROPLASTY (Left Knee)     Patient location during evaluation: PACU Anesthesia Type: Spinal Level of consciousness: awake and alert and oriented Pain management: pain level controlled Vital Signs Assessment: post-procedure vital signs reviewed and stable Respiratory status: spontaneous breathing, nonlabored ventilation and respiratory function stable Cardiovascular status: blood pressure returned to baseline Postop Assessment: no apparent nausea or vomiting, spinal receding, no headache and no backache Anesthetic complications: no   No complications documented.  Last Vitals:  Vitals:   07/30/20 1014 07/30/20 1039  BP:  (!) 157/77  Pulse: (!) 57 (!) 55  Resp: (!) 0 18  Temp:    SpO2: 100% 100%    Last Pain:  Vitals:   07/30/20 1014  TempSrc:   PainSc: 5     LLE Motor Response: No movement due to regional block (07/30/20 1108) LLE Sensation: Numbness;Decreased (07/30/20 1108) RLE Motor Response: Purposeful movement (07/30/20 1108) RLE Sensation: Full sensation (07/30/20 1108)      Brennan Bailey

## 2020-07-30 NOTE — Telephone Encounter (Signed)
Patient's daughter Sandrea Hammond called about mother's placement after being discharged to a facility of care. She states there is no one home to care for patient and needs to go to a home health facility to recover. Please call Ditra at [phone].

## 2020-07-30 NOTE — Telephone Encounter (Signed)
Please see below and advise.

## 2020-07-30 NOTE — Transfer of Care (Signed)
Immediate Anesthesia Transfer of Care Note  Patient: [name]  Procedure(s) Performed: LEFT TOTAL KNEE ARTHROPLASTY (Left Knee)  Patient Location: PACU  Anesthesia Type:MAC  Level of Consciousness: awake, alert , oriented, patient cooperative and responds to stimulation  Airway & Oxygen Therapy: Patient Spontanous Breathing and Patient connected to face mask oxygen  Post-op Assessment: Report given to RN and Post -op Vital signs reviewed and stable  Post vital signs: Reviewed and stable  Last Vitals:  Vitals Value Taken Time  BP 180/80 07/30/20 0939  Temp    Pulse 64 07/30/20 0941  Resp 10 07/30/20 0941  SpO2 100 % 07/30/20 0941  Vitals shown include unvalidated device data.  Last Pain:  Vitals:   07/30/20 0609  TempSrc:   PainSc: 0-No pain         Complications: No complications documented.

## 2020-07-30 NOTE — Anesthesia Procedure Notes (Signed)
Spinal  Patient location during procedure: OR Start time: 07/30/2020 7:20 AM End time: 07/30/2020 7:26 AM Reason for block: surgical anesthesia Staffing Performed: anesthesiologist  Anesthesiologist: Brennan Bailey, MD Preanesthetic Checklist Completed: patient identified, IV checked, risks and benefits discussed, surgical consent, monitors and equipment checked, pre-op evaluation and timeout performed Spinal Block Patient position: sitting Prep: DuraPrep and site prepped and draped Patient monitoring: continuous pulse ox, blood pressure and heart rate Approach: right paramedian Location: L3-4 Injection technique: single-shot Needle Needle type: Pencan  Needle gauge: 24 G Needle length: 9 cm Assessment Events: CSF return Additional Notes Risks, benefits, and alternative discussed. Patient gave consent to procedure. Prepped and draped in sitting position. Patient sedated but responsive to voice. 2 attempts required-first midline, second right paramedian. Clear CSF obtained. Positive terminal aspiration. No pain or paraesthesias with injection. Patient tolerated procedure well. Vital signs stable. Tawny Asal, MD

## 2020-07-30 NOTE — Evaluation (Signed)
Physical Therapy Evaluation Patient Details Name: [name] MRN: [id number] DOB: [birth year] Today's Date: 07/30/2020   History of Present Illness  Pt is a 75 y/o female s/p L TKA on 3/28. PMH includes DM, HTN, breast cancer, and R TKA.  Clinical Impression  Pt is s/p surgery above with deficits below. Pt requiring min A for bed mobility tasks. Upon sitting, pt with increased lightheadedness and reports she felt as if she were going to pass out. Returned to supine. Reports symptoms improved with transition to supine. Reviewed knee precautions with pt. Pt reports she lives with her husband, but he is not there all the time, so she may be interested in SNF. Will continue to follow acutely to maximize functional mobility independence and safety.     Follow Up Recommendations Follow surgeon's recommendation for DC plan and follow-up therapies (pt reports she would like to go to SNF)    Equipment Recommendations  Rolling walker with 5" wheels;3in1 (PT) (youth RW)    Recommendations for Other Services       Precautions / Restrictions Precautions Precautions: Knee Precaution Booklet Issued: No Precaution Comments: Verbally reviewed knee precautions. Restrictions Weight Bearing Restrictions: Yes LLE Weight Bearing: Weight bearing as tolerated      Mobility  Bed Mobility Overal bed mobility: Needs Assistance Bed Mobility: Supine to Sit;Sit to Supine     Supine to sit: Min assist Sit to supine: Min assist   General bed mobility comments: Min A for LLE assist. Upon sitting, pt with increased lightheadedness and wooziness and reported she felt like she was going to pass out, so returned to supine. Pt reporting symptoms improved upon return to supine.    Transfers                    Ambulation/Gait                Stairs            Wheelchair Mobility    Modified Rankin (Stroke Patients Only)       Balance Overall balance assessment: Needs  assistance Sitting-balance support: No upper extremity supported;Feet supported Sitting balance-Leahy Scale: Fair                                       Pertinent Vitals/Pain Pain Assessment: Faces Faces Pain Scale: Hurts whole lot Pain Location: L knee Pain Descriptors / Indicators: Operative site guarding;Aching Pain Intervention(s): Limited activity within patient's tolerance;Monitored during session;Repositioned    Home Living Family/patient expects to be discharged to:: Private residence Living Arrangements: Spouse/significant other;Other relatives Available Help at Discharge: Family;Available PRN/intermittently Type of Home: House Home Access: Stairs to enter Entrance Stairs-Rails: None Entrance Stairs-Number of Steps: 2 Home Layout: Two level Home Equipment: None      Prior Function Level of Independence: Independent               Hand Dominance        Extremity/Trunk Assessment   Upper Extremity Assessment Upper Extremity Assessment: Defer to OT evaluation    Lower Extremity Assessment Lower Extremity Assessment: LLE deficits/detail LLE Deficits / Details: Deficits consistent with post op pain and weakness.    Cervical / Trunk Assessment Cervical / Trunk Assessment: Normal  Communication   Communication: No difficulties  Cognition Arousal/Alertness: Awake/alert Behavior During Therapy: WFL for tasks assessed/performed Overall Cognitive Status: Within Functional Limits for tasks assessed  General Comments      Exercises     Assessment/Plan    PT Assessment Patient needs continued PT services  PT Problem List Decreased strength;Decreased range of motion;Decreased activity tolerance;Decreased balance;Decreased mobility;Decreased knowledge of use of DME;Decreased knowledge of precautions;Pain       PT Treatment Interventions DME instruction;Gait training;Stair  training;Functional mobility training;Therapeutic activities;Balance training;Therapeutic exercise;Patient/family education    PT Goals (Current goals can be found in the Care Plan section)  Acute Rehab PT Goals Patient Stated Goal: to go to SNF PT Goal Formulation: With patient Time For Goal Achievement: 08/13/20 Potential to Achieve Goals: Good    Frequency 7X/week   Barriers to discharge        Co-evaluation               AM-PAC PT "6 Clicks" Mobility  Outcome Measure Help needed turning from your back to your side while in a flat bed without using bedrails?: A Little Help needed moving from lying on your back to sitting on the side of a flat bed without using bedrails?: A Little Help needed moving to and from a bed to a chair (including a wheelchair)?: A Little Help needed standing up from a chair using your arms (e.g., wheelchair or bedside chair)?: A Little Help needed to walk in hospital room?: A Little Help needed climbing 3-5 steps with a railing? : A Lot 6 Click Score: 17    End of Session   Activity Tolerance: Treatment limited secondary to medical complications (Comment) (lightheadedness) Patient left: in bed;with call bell/phone within reach;with nursing/sitter in room Nurse Communication: Mobility status PT Visit Diagnosis: Other abnormalities of gait and mobility (R26.89);Pain Pain - Right/Left: Left Pain - part of body: Knee    Time: 7628-3151 PT Time Calculation (min) (ACUTE ONLY): 16 min   Charges:   PT Evaluation $PT Eval Low Complexity: 1 Low          Lou Miner, DPT  Acute Rehabilitation Services  Pager: [phone] Office: [phone]   Rudean Hitt 07/30/2020, 1:54 PM

## 2020-07-30 NOTE — H&P (Signed)
PREOPERATIVE H&P  Chief Complaint: left knee osteoarthritis  HPI: [name] is a 75 y.o. female who presents for surgical treatment of left knee osteoarthritis.  She denies any changes in medical history.  Past Medical History:  Diagnosis Date  . Allergy   . Anemia   . Breast CA ([place])    (Rt) breast ca dx 4/12---  S/P RADICAL MASTECTOMY AND CHEMORADIATION  . Diabetes mellitus ORAL MED  . Fatty liver disease, nonalcoholic   . History of hyperkalemia   . Hx antineoplastic chemo  10/12 - 06/24/11   PACLITAX EL COMPLETED 06/24/11  . Hyperlipidemia   . Hypertension   . Mild nonproliferative diabetic retinopathy of right eye ([place]) 03/19/11   Dr. Joseph Art  . Numbness and tingling    Hx: of in fingers and toes since chemotherapy  . OA (osteoarthritis) of knee    RIGHT LEG  . Obesity   . Renal lesion    Past Surgical History:  Procedure Laterality Date  . COLONOSCOPY W/ POLYPECTOMY     Hx; of  . I & D KNEE WITH POLY EXCHANGE Right 09/03/2012   Procedure: IRRIGATION AND DEBRIDEMENT RIGHT KNEE WITH POLY EXCHANGE;  Surgeon: Kerin Salen, MD;  Location: [facility];  Service: Orthopedics;  Laterality: Right;  . JOINT REPLACEMENT    . knee arthroscopy  10-28-1999   RIGHT  . Laparoscopy with laparoscopic right salpingo oophorectomy and lysis of pelvic and abdominal adhesions  March 2013   Dr. Nori Riis   . MASTECTOMY MODIFIED RADICAL  12-04-2010   W/ LEFT PAC PLACEMENT (RIGHT BREAST W/ AXILLARY CONTENTS/ NODE BX'S  . PORT-A-CATH REMOVAL  09/24/2011   Procedure: REMOVAL PORT-A-CATH;  Surgeon: Joyice Faster. Cornett, MD;  Location: [facility];  Service: General;  Laterality: N/A;  . PORTACATH PLACEMENT     REPLACED PAC DUE TO MALFUNCTION (LEFT)  . TOTAL KNEE ARTHROPLASTY Right 08/16/2012   Procedure: TOTAL KNEE ARTHROPLASTY;  Surgeon: Kerin Salen, MD;  Location: [facility];  Service: Orthopedics;  Laterality: Right;  . TRANSTHORACIC ECHOCARDIOGRAM  10-29-2010   NORMAL LVF/ EF 55-60%/ MILD MV  REGURG  . TUBAL LIGATION  YRS AGO  . VAGINAL HYSTERECTOMY  AGE 59   W/ LSO   Social History   Socioeconomic History  . Marital status: Married    Spouse name: Not on file  . Number of children: Not on file  . Years of education: 71  . Highest education level: Not on file  Occupational History  . Not on file  Tobacco Use  . Smoking status: Never Smoker  . Smokeless tobacco: Never Used  Vaping Use  . Vaping Use: Never used  Substance and Sexual Activity  . Alcohol use: No    Alcohol/week: 0.0 standard drinks  . Drug use: No    Types: Flunitrazepam  . Sexual activity: Not on file    Comment: MENARCHE AGE 73, G4, P4, PARITY AGE 65, HRT 20+ YEARS    Other Topics Concern  . 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  Patient needs to submit further paperwork to complete- Bonna Gains 08/14/09.   Financial assistance application completed.  Patient does not qualify for [place] 09/12/09.            Social  Determinants of Health   Financial Resource Strain: Not on file  Food Insecurity: Not on file  Transportation Needs: Not on file  Physical Activity: Not on file  Stress: Not on file  Social Connections: Not on file   Family History  Problem Relation Age of Onset  . Stroke Father   . Heart disease Sister   . Cancer Sister        breast  . Heart disease Brother   . Cancer Sister        COLON CANCER   Allergies  Allergen Reactions  . Hctz [Hydrochlorothiazide] Other (See Comments)    Pancreatitis   Prior to Admission medications   Medication Sig Start Date End Date Taking? Authorizing Provider  acetaminophen (TYLENOL) 500 MG tablet Take 1 tablet (500 mg total) by mouth every 6 (six) hours as needed. 06/08/20  Yes Augusto Gamble B, NP  aspirin EC 81 MG tablet Take 1 tablet (81  mg total) by mouth 2 (two) times daily. To be taken after surgery 07/26/20   Aundra Dubin, PA-C  atorvastatin (LIPITOR) 40 MG tablet TAKE 1 TABLET BY MOUTH EVERYDAY AT BEDTIME Patient taking differently: Take 40 mg by mouth at bedtime. 05/24/20  Yes Aslam, Loralyn Freshwater, MD  chlorthalidone (HYGROTON) 25 MG tablet TAKE 1 TABLET BY MOUTH EVERY DAY Patient taking differently: Take 25 mg by mouth daily. 06/04/20  Yes Jeralyn Bennett, MD  docusate sodium (COLACE) 100 MG capsule Take 1 capsule (100 mg total) by mouth daily as needed. 07/26/20 07/26/21  Aundra Dubin, PA-C  fluticasone (FLONASE) 50 MCG/ACT nasal spray Place 2 sprays into both nostrils daily. Patient taking differently: Place 2 sprays into both nostrils daily as needed for allergies or rhinitis (allergies.). 06/21/18 08/07/21 Yes Santos-Sanchez, Merlene Morse, MD  lisinopril (ZESTRIL) 20 MG tablet TAKE 1 TABLET BY MOUTH EVERY DAY Patient taking differently: Take 20 mg by mouth daily. 05/10/20  Yes Jeralyn Bennett, MD  methocarbamol (ROBAXIN) 500 MG tablet Take 1 tablet (500 mg total) by mouth 2 (two) times daily as needed. To be taken after surgery 07/26/20   Aundra Dubin, PA-C  ondansetron (ZOFRAN) 4 MG tablet Take 1 tablet (4 mg total) by mouth every 8 (eight) hours as needed for nausea or vomiting. 07/26/20   Aundra Dubin, PA-C  oxyCODONE-acetaminophen (PERCOCET) 5-325 MG tablet Take 1-2 tablets by mouth every 6 (six) hours as needed. To be taken after surgery 07/26/20   Aundra Dubin, PA-C  Polyethyl Glycol-Propyl Glycol (SYSTANE) 0.4-0.3 % SOLN Place 1 drop into both eyes daily as needed (Dry eyes).   Yes [provider]  Accu-Chek Softclix Lancets lancets Check blood sugar up to three times daily. 05/12/19   Ina Homes, MD  cephALEXin (KEFLEX) 500 MG capsule Take 1 capsule (500 mg total) by mouth 4 (four) times daily. 07/27/20   Aundra Dubin, PA-C  ciprofloxacin (CIPRO) 500 MG tablet Take 1 tablet (500 mg total) by mouth 2 (two)  times daily for 3 days. 07/27/20 07/30/20  Aundra Dubin, PA-C  empagliflozin (JARDIANCE) 10 MG TABS tablet Take 1 tablet (10 mg total) by mouth daily. Patient not taking: No sig reported 03/23/20   Riesa Pope, MD  glucose blood (ACCU-CHEK GUIDE) test strip Check blood sugar up to three times daily. 05/12/19   Ina Homes, MD  meloxicam (MOBIC) 7.5 MG tablet Take 1 tablet (7.5 mg total) by mouth 2 (two) times daily as needed for pain. Patient not taking: No sig reported 06/19/20  Leandrew Koyanagi, MD  metFORMIN (GLUCOPHAGE-XR) 500 MG 24 hr tablet Take 2 tablets (1,000 mg total) by mouth daily with breakfast. Patient not taking: Reported on 07/23/2020 03/20/20   Riesa Pope, MD  potassium chloride (KLOR-CON) 10 MEQ tablet Take 1 tablet (10 mEq total) by mouth in the morning, at noon, in the evening, and at bedtime for 5 days. 07/26/20 07/31/20  Leandrew Koyanagi, MD     Positive ROS: All other systems have been reviewed and were otherwise negative with the exception of those mentioned in the HPI and as above.  Physical Exam: General: Alert, no acute distress Cardiovascular: No pedal edema Respiratory: No cyanosis, no use of accessory musculature GI: abdomen soft Skin: No lesions in the area of chief complaint Neurologic: Sensation intact distally Psychiatric: Patient is competent for consent with normal mood and affect Lymphatic: no lymphedema  MUSCULOSKELETAL: exam stable  Assessment: left knee osteoarthritis  Plan: Plan for Procedure(s): LEFT TOTAL KNEE ARTHROPLASTY  The risks benefits and alternatives were discussed with the patient including but not limited to the risks of nonoperative treatment, versus surgical intervention including infection, bleeding, nerve injury,  blood clots, cardiopulmonary complications, morbidity, mortality, among others, and they were willing to proceed.   Preoperative templating of the joint replacement has been completed, documented,  and submitted to the Operating Room personnel in order to optimize intra-operative equipment management.   Eduard Roux, MD 07/30/2020 5:54 AM

## 2020-07-30 NOTE — Anesthesia Procedure Notes (Signed)
Anesthesia Regional Block: Adductor canal block   Pre-Anesthetic Checklist: ,, timeout performed, Correct Patient, Correct Site, Correct Laterality, Correct Procedure, Correct Position, site marked, Risks and benefits discussed, pre-op evaluation,  At surgeon's request and post-op pain management  Laterality: Left  Prep: Maximum Sterile Barrier Precautions used, chloraprep       Needles:  Injection technique: Single-shot  Needle Type: Echogenic Stimulator Needle     Needle Length: 9cm  Needle Gauge: 22     Additional Needles:   Procedures:,,,, ultrasound used (permanent image in chart),,,,  Narrative:  Start time: 07/30/2020 7:02 AM End time: 07/30/2020 7:05 AM Injection made incrementally with aspirations every 5 mL.  Performed by: Personally  Anesthesiologist: Brennan Bailey, MD  Additional Notes: Risks, benefits, and alternative discussed. Patient gave consent for procedure. Patient prepped and draped in sterile fashion. Sedation administered, patient remains easily responsive to voice. Relevant anatomy identified with ultrasound guidance. Local anesthetic given in 5cc increments with no signs or symptoms of intravascular injection. No pain or paraesthesias with injection. Patient monitored throughout procedure with signs of LAST or immediate complications. Tolerated well. Ultrasound image placed in chart.  Tawny Asal, MD

## 2020-07-31 ENCOUNTER — Encounter (HOSPITAL_COMMUNITY): Payer: Self-pay | Admitting: Orthopaedic Surgery

## 2020-07-31 ENCOUNTER — Other Ambulatory Visit: Payer: Self-pay | Admitting: Physician Assistant

## 2020-07-31 DIAGNOSIS — M1712 Unilateral primary osteoarthritis, left knee: Secondary | ICD-10-CM | POA: Diagnosis not present

## 2020-07-31 LAB — BASIC METABOLIC PANEL
Anion gap: 10 (ref 5–15)
BUN: 7 mg/dL — ABNORMAL LOW (ref 8–23)
CO2: 26 mmol/L (ref 22–32)
Calcium: 8.5 mg/dL — ABNORMAL LOW (ref 8.9–10.3)
Chloride: 103 mmol/L (ref 98–111)
Creatinine, Ser: 0.87 mg/dL (ref 0.44–1.00)
GFR, Estimated: >60 mL/min (ref >60)
Glucose, Bld: 124 mg/dL — ABNORMAL HIGH (ref 70–99)
Potassium: 3.4 mmol/L — ABNORMAL LOW (ref 3.5–5.1)
Sodium: 139 mmol/L (ref 135–145)

## 2020-07-31 LAB — CBC
HCT: 32.2 % — ABNORMAL LOW (ref 36.0–46.0)
Hemoglobin: 10.6 g/dL — ABNORMAL LOW (ref 12.0–15.0)
MCH: 31.2 pg (ref 26.0–34.0)
MCHC: 32.9 g/dL (ref 30.0–36.0)
MCV: 94.7 fL (ref 80.0–100.0)
Platelets: 213 10*3/uL (ref 150–400)
RBC: 3.4 MIL/uL — ABNORMAL LOW (ref 3.87–5.11)
RDW: 16.4 % — ABNORMAL HIGH (ref 11.5–15.5)
WBC: 6.8 10*3/uL (ref 4.0–10.5)
nRBC: 0 % (ref 0.0–0.2)

## 2020-07-31 LAB — GLUCOSE, CAPILLARY
Glucose-Capillary: 159 mg/dL — ABNORMAL HIGH (ref 70–99)
Glucose-Capillary: 154 mg/dL — ABNORMAL HIGH (ref 70–99)
Glucose-Capillary: 149 mg/dL — ABNORMAL HIGH (ref 70–99)
Glucose-Capillary: 141 mg/dL — ABNORMAL HIGH (ref 70–99)

## 2020-07-31 MED ORDER — OXYCODONE-ACETAMINOPHEN 5-325 MG PO TABS
1.0000 | ORAL_TABLET | Freq: Four times a day (QID) | ORAL | 0 refills | Status: DC | PRN
Start: 2020-07-31 — End: 2021-01-14

## 2020-07-31 NOTE — Progress Notes (Signed)
Subjective: 1 Day Post-Op Procedure(s) (LRB): LEFT TOTAL KNEE ARTHROPLASTY (Left) Patient reports pain as mild.    Objective: Vital signs in last 24 hours: Temp:  [97.7 F (36.5 C)-98.3 F (36.8 C)] 97.7 F (36.5 C) (03/29 0437) Pulse Rate:  [55-83] 79 (03/29 0437) Resp:  [0-24] 16 (03/29 0437) BP: (138-189)/(67-126) 138/67 (03/29 0437) SpO2:  [90 %-100 %] 100 % (03/29 0437)  Intake/Output from previous day: 03/28 0701 - 03/29 0700 In: 2948.9 [I.V.:2548.9; IV Piggyback:400] Out: 4492 [Urine:3650; Blood:25] Intake/Output this shift: No intake/output data recorded.  Recent Labs    07/30/20 0646 07/31/20 0655  HGB 11.6* 10.6*   Recent Labs    07/30/20 0646 07/31/20 0655  WBC  --  6.8  RBC  --  3.40*  HCT 34.0* 32.2*  PLT  --  213   Recent Labs    07/30/20 0646  NA 143  K 3.4*  CL 103  BUN 10  CREATININE 0.70  GLUCOSE 127*   No results for input(s): LABPT, INR in the last 72 hours.  Neurologically intact Neurovascular intact Sensation intact distally Intact pulses distally Dorsiflexion/Plantar flexion intact Incision: dressing C/D/I No cellulitis present Compartment soft   Assessment/Plan: 1 Day Post-Op Procedure(s) (LRB): LEFT TOTAL KNEE ARTHROPLASTY (Left) Advance diet Up with therapy D/C IV fluids Discharge to SNF once insurance approves and bed available WBAT LLE ABLA- mild and stable   Anticipated LOS equal to or greater than 2 midnights due to - Age 1 and older with one or more of the following:  - Obesity  - Expected need for hospital services (PT, OT, Nursing) required for safe  discharge  - Anticipated need for postoperative skilled nursing care or inpatient rehab  - Active co-morbidities: Diabetes OR   - Unanticipated findings during/Post Surgery: Slow post-op progression: GI, pain control, mobility  - Patient is a high risk of re-admission due to: Barriers to post-acute care (logistical, no family support in home)    Aundra Dubin 07/31/2020, 7:45 AM

## 2020-07-31 NOTE — NC FL2 (Signed)
[place] LEVEL OF CARE SCREENING TOOL     IDENTIFICATION  Patient Name: [name] Birthdate: 1945-10-06 Sex: female Admission Date (Current Location): 07/30/2020  [facility] and [place] Number:  Herbalist and Address:  [facility], [place] [address] [place], [place] [address]      Provider Number: [id number]  Attending Physician Name and Address:  Leandrew Koyanagi, MD  Relative Name and Phone Number:       Current Level of Care: Hospital Recommended Level of Care: [place] Prior Approval Number:    Date Approved/Denied:   PASRR Number: [id number]  Discharge Plan: SNF    Current Diagnoses: Patient Active Problem List   Diagnosis Date Noted  . Status post total left knee replacement 07/30/2020  . Fatigue 02/10/2019  . Primary osteoarthritis of left knee 01/25/2019  . Left carpal tunnel syndrome 01/25/2019  . Cervical neuropathy 08/20/2018  . Bilateral carpal tunnel syndrome 06/29/2018  . Depressed mood 01/21/2018  . Need for immunization against influenza 01/17/2018  . Osteopenia 12/13/2013  . Vaginal dryness 12/13/2013  . Peripheral neuropathy 11/02/2013  . Health care maintenance 11/17/2012  . Arthritis of knee, right 08/16/2012  . Breast cancer, right breast ([place]) 09/25/2010  . COLONIC POLYPS, HX OF 07/19/2007  . OBESITY NOS 07/09/2006  . Type 2 diabetes mellitus with renal manifestations ([place]) 03/10/2006  . Hyperlipidemia 03/10/2006  . Hypertension associated with diabetes ([place]) 03/10/2006  . ALLERGIC RHINITIS 03/10/2006  . Fatty liver 03/10/2006    Orientation RESPIRATION BLADDER Height & Weight     Self,Time,Situation,Place  Normal Continent Weight: 150 lb (68 kg) Height:  4\' 11"  (149.9 cm)  BEHAVIORAL SYMPTOMS/MOOD NEUROLOGICAL BOWEL NUTRITION STATUS      Continent Diet (See dc summary)  AMBULATORY STATUS COMMUNICATION OF NEEDS Skin   Limited Assist Verbally Surgical wounds                        Personal Care Assistance Level of Assistance  Bathing,Feeding,Dressing Bathing Assistance: Limited assistance Feeding assistance: Independent Dressing Assistance: Limited assistance     Functional Limitations Info  Sight,Hearing,Speech Sight Info: Adequate Hearing Info: Adequate Speech Info: Adequate    SPECIAL CARE FACTORS FREQUENCY  PT (By licensed PT),OT (By licensed OT)     PT Frequency: 5x a week OT Frequency: 5x a week            Contractures Contractures Info: Not present    Additional Factors Info  Code Status,Allergies Code Status Info: Full Allergies Info: Hctz           Current Medications (07/31/2020):  This is the current hospital active medication list Current Facility-Administered Medications  Medication Dose Route Frequency Provider Last Rate Last Admin  . 0.9 %  sodium chloride infusion   Intravenous Continuous Leandrew Koyanagi, MD   Stopped at 07/31/20 [phone]  . acetaminophen (TYLENOL) tablet 1,000 mg  1,000 mg Oral Q6H Leandrew Koyanagi, MD   1,000 mg at 07/31/20 3267  . acetaminophen (TYLENOL) tablet 325-650 mg  325-650 mg Oral Q6H PRN Leandrew Koyanagi, MD      . alum & mag hydroxide-simeth (MAALOX/MYLANTA) 200-200-20 MG/5ML suspension 30 mL  30 mL Oral Q4H PRN Leandrew Koyanagi, MD      . aspirin chewable tablet 81 mg  81 mg Oral BID Leandrew Koyanagi, MD   81 mg at 07/30/20 2124  . chlorthalidone (HYGROTON) tablet 25 mg  25 mg Oral Daily Leandrew Koyanagi, MD   25 mg at 07/30/20 1427  . diphenhydrAMINE (BENADRYL) 12.5 MG/5ML elixir 25 mg  25 mg Oral Q4H PRN Leandrew Koyanagi, MD      . docusate sodium (COLACE) capsule 100 mg  100 mg Oral BID Leandrew Koyanagi, MD   100 mg at 07/30/20 2124  . HYDROmorphone (DILAUDID) injection 0.5-1 mg  0.5-1 mg Intravenous Q4H PRN Leandrew Koyanagi, MD   1 mg at 07/30/20 1054  . insulin aspart (novoLOG) injection 0-15 Units  0-15 Units Subcutaneous TID WC Leandrew Koyanagi, MD      . insulin aspart (novoLOG) injection 0-5 Units  0-5  Units Subcutaneous QHS Leandrew Koyanagi, MD      . lisinopril (ZESTRIL) tablet 20 mg  20 mg Oral Daily Leandrew Koyanagi, MD   20 mg at 07/30/20 1427  . magnesium citrate solution 1 Bottle  1 Bottle Oral Once PRN Leandrew Koyanagi, MD      . menthol-cetylpyridinium (CEPACOL) lozenge 3 mg  1 lozenge Oral PRN Leandrew Koyanagi, MD       Or  . phenol (CHLORASEPTIC) mouth spray 1 spray  1 spray Mouth/Throat PRN Leandrew Koyanagi, MD      . methocarbamol (ROBAXIN) tablet 500 mg  500 mg Oral Q6H PRN Leandrew Koyanagi, MD   500 mg at 07/30/20 1929   Or  . methocarbamol (ROBAXIN) 500 mg in dextrose 5 % 50 mL IVPB  500 mg Intravenous Q6H PRN Leandrew Koyanagi, MD      . metoCLOPramide (REGLAN) tablet 5-10 mg  5-10 mg Oral Q8H PRN Leandrew Koyanagi, MD       Or  . metoCLOPramide (REGLAN) injection 5-10 mg  5-10 mg Intravenous Q8H PRN Leandrew Koyanagi, MD      . ondansetron [facility]) tablet 4 mg  4 mg Oral Q6H PRN Leandrew Koyanagi, MD       Or  . ondansetron [facility]) injection 4 mg  4 mg Intravenous Q6H PRN Leandrew Koyanagi, MD   4 mg at 07/30/20 1926  . oxyCODONE (Oxy IR/ROXICODONE) immediate release tablet 10-15 mg  10-15 mg Oral Q4H PRN Leandrew Koyanagi, MD      . oxyCODONE (Oxy IR/ROXICODONE) immediate release tablet 5-10 mg  5-10 mg Oral Q4H PRN Leandrew Koyanagi, MD   10 mg at 07/30/20 1930  . pantoprazole (PROTONIX) EC tablet 40 mg  40 mg Oral Daily Leandrew Koyanagi, MD   40 mg at 07/30/20 1428  . polyethylene glycol (MIRALAX / GLYCOLAX) packet 17 g  17 g Oral Daily Leandrew Koyanagi, MD      . potassium chloride (KLOR-CON) CR tablet 10 mEq  10 mEq Oral TID Leandrew Koyanagi, MD   10 mEq at 07/30/20 2124  . sorbitol 70 % solution 30 mL  30 mL Oral Daily PRN Leandrew Koyanagi, MD         Discharge Medications: Please see discharge summary for a list of discharge medications.  Relevant Imaging Results:  Relevant Lab Results:   Additional Information SSN: [id number], covid vaccinated  Emeterio Reeve, [place]

## 2020-07-31 NOTE — Progress Notes (Signed)
Orthopedic Tech Progress Note Patient Details:  [name] 05-21-1945 [id number]  CPM Left Knee CPM Left Knee: On Left Knee Flexion (Degrees): 0 Left Knee Extension (Degrees): 40 Additional Comments: Patient couldnt tolerate 60  Post Interventions Patient Tolerated: Fair Instructions Provided: Care of device,Poper ambulation with device  Ellouise Newer 07/31/2020, 6:33 PM

## 2020-07-31 NOTE — Social Work (Signed)
CSW met with pt to get bed choice. Pt stated she had not looked at list yet and is waiting until her husband visits today and discuss it with him. CSW stressed that a decision must be made by tomorrow morning so CSW can complete SNF process.   Emeterio Reeve, Latanya Presser, [place] Social Worker [phone]

## 2020-07-31 NOTE — Discharge Instructions (Signed)

## 2020-07-31 NOTE — TOC Initial Note (Signed)
Transition of Care [facility]) - Initial/Assessment Note    Patient Details  Name: [name] MRN: [id number] Date of Birth: 12-19-1945  [facility] CM/SW Contact:    Emeterio Reeve, [place] Phone Number: 07/31/2020, 11:58 AM  Clinical Narrative:                  CSW met with pt at bedside. CSW introduced self and explained her role at the hospital.  Pt stated PTA she was living at home with her husband. Pt reports she was independent with mobility and ADL's. Pt reports she has the covid vaccine but not the booster.   CSW and pt discussed pt/ot reccs for SNF. Pt reports she has been to [facility] in the past. Pt has multiple bed offers. Csw informed pt she wil come back in a few hours fr decision about SNF offers.   Expected Discharge Plan: Skilled Nursing Facility Barriers to Discharge: Continued Medical Work up   Patient Goals and CMS Choice Patient states their goals for this hospitalization and ongoing recovery are:: to get better CMS Medicare.gov Compare Post Acute Care list provided to:: Patient Choice offered to / list presented to : Patient  Expected Discharge Plan and Services Expected Discharge Plan: [place]       Living arrangements for the past 2 months: Single Family Home Expected Discharge Date: 08/01/20                                    Prior Living Arrangements/Services Living arrangements for the past 2 months: Single Family Home Lives with:: Spouse Patient language and need for interpreter reviewed:: Yes Do you feel safe going back to the place where you live?: Yes      Need for Family Participation in Patient Care: Yes (Comment) Care giver support system in place?: Yes (comment)   Criminal Activity/Legal Involvement Pertinent to Current Situation/Hospitalization: No - Comment as needed  Activities of Daily Living Home Assistive Devices/Equipment: Eyeglasses ADL Screening (condition at time of  admission) Patient's cognitive ability adequate to safely complete daily activities?: Yes Is the patient deaf or have difficulty hearing?: No Does the patient have difficulty seeing, even when wearing glasses/contacts?: No Does the patient have difficulty concentrating, remembering, or making decisions?: No Patient able to express need for assistance with ADLs?: Yes Does the patient have difficulty dressing or bathing?: No Independently performs ADLs?: Yes (appropriate for developmental age) Does the patient have difficulty walking or climbing stairs?: Yes Weakness of Legs: Left Weakness of Arms/Hands: None  Permission Sought/Granted Permission sought to share information with : Facility Art therapist granted to share information with : No     Permission granted to share info w AGENCY: SNF        Emotional Assessment Appearance:: Appears stated age Attitude/Demeanor/Rapport: Engaged Affect (typically observed): Appropriate Orientation: : Oriented to Self,Oriented to Place,Oriented to  Time,Oriented to Situation   Psych Involvement: No (comment)  Admission diagnosis:  Status post total left knee replacement [Z96.652] Patient Active Problem List   Diagnosis Date Noted  . Status post total left knee replacement 07/30/2020  . Fatigue 02/10/2019  . Primary osteoarthritis of left knee 01/25/2019  . Left carpal tunnel syndrome 01/25/2019  . Cervical neuropathy 08/20/2018  . Bilateral carpal tunnel syndrome 06/29/2018  . Depressed mood 01/21/2018  . Need for immunization against influenza 01/17/2018  . Osteopenia 12/13/2013  . Vaginal  dryness 12/13/2013  . Peripheral neuropathy 11/02/2013  . Health care maintenance 11/17/2012  . Arthritis of knee, right 08/16/2012  . Breast cancer, right breast ([place]) 09/25/2010  . COLONIC POLYPS, HX OF 07/19/2007  . OBESITY NOS 07/09/2006  . Type 2 diabetes mellitus with renal manifestations ([place]) 03/10/2006  .  Hyperlipidemia 03/10/2006  . Hypertension associated with diabetes ([place]) 03/10/2006  . ALLERGIC RHINITIS 03/10/2006  . Fatty liver 03/10/2006   PCP:  Riesa Pope, MD Pharmacy:   [id number] [place]Phone: [phone]: [phone]    Social Determinants of Health (SDOH) Interventions    Readmission Risk Interventions No flowsheet data found.  MEmeterio Reeve LLatanya Presser [place]onSocial Worker [phone]

## 2020-07-31 NOTE — Evaluation (Signed)
Occupational Therapy Evaluation Patient Details Name: [name] MRN: [id number] DOB: 11-06-1945 Today's Date: 07/31/2020    History of Present Illness Pt is a 75 y/o female s/p L TKA on 3/28. PMH includes DM, HTN, breast cancer, and R TKA.   Clinical Impression   Pt presents with decline in function and safety with ADLs and ADL mobility with impaired balance and endurance. PTA, pt lived at home with her husband and was Ind with ADLs/selfcare, mobility and home mgt. Pt currently requires mod A with LB ADLs/toileting and min guard A with mobility using RW. Pt would benefit from acute OT services to address impairments to maximize level of function and safety    Follow Up Recommendations  Follow surgeon's recommendation for DC plan and follow-up therapies;Other (comment) (Per pt, plan is [facility])    Equipment Recommendations  3 in 1 bedside commode;Other (comment) (RW)    Recommendations for Other Services       Precautions / Restrictions Precautions Precautions: Knee Precaution Booklet Issued: No Precaution Comments: Verbally reviewed knee precautions. Restrictions Weight Bearing Restrictions: Yes LLE Weight Bearing: Non weight bearing      Mobility Bed Mobility Overal bed mobility: Needs Assistance Bed Mobility: Supine to Sit;Sit to Supine     Supine to sit: Min guard Sit to supine: Min guard   General bed mobility comments: Pt required increased time and effort    Transfers Overall transfer level: Needs assistance Equipment used: Rolling walker (2 wheeled) Transfers: Sit to/from Stand Sit to Stand: Min guard         General transfer comment: Cues for hand placement    Balance Overall balance assessment: Needs assistance Sitting-balance support: No upper extremity supported;Feet supported Sitting balance-Leahy Scale: Fair     Standing balance support: Bilateral upper extremity supported;During functional activity Standing balance-Leahy  Scale: Poor Standing balance comment: B UE support and external assistance.                           ADL either performed or assessed with clinical judgement   ADL Overall ADL's : Needs assistance/impaired Eating/Feeding: Modified independent;Independent;Sitting   Grooming: Wash/dry hands;Wash/dry face;Min guard;Standing   Upper Body Bathing: Set up;Sitting   Lower Body Bathing: Moderate assistance   Upper Body Dressing : Set up;Sitting   Lower Body Dressing: Moderate assistance   Toilet Transfer: Min guard;Ambulation;RW;BSC;Cueing for safety   Toileting- Clothing Manipulation and Hygiene: Moderate assistance;Sit to/from stand       Functional mobility during ADLs: Min guard       Vision Patient Visual Report: No change from baseline       Perception     Praxis      Pertinent Vitals/Pain Pain Assessment: 0-10 Pain Score: 4  Pain Location: L knee Pain Descriptors / Indicators: Operative site guarding;Aching Pain Intervention(s): Monitored during session;Repositioned;Ice applied     Hand Dominance Right   Extremity/Trunk Assessment Upper Extremity Assessment Upper Extremity Assessment: Overall WFL for tasks assessed   Lower Extremity Assessment Lower Extremity Assessment: Defer to PT evaluation   Cervical / Trunk Assessment Cervical / Trunk Assessment: Normal   Communication Communication Communication: No difficulties   Cognition Arousal/Alertness: Awake/alert Behavior During Therapy: WFL for tasks assessed/performed Overall Cognitive Status: Within Functional Limits for tasks assessed  General Comments       Exercises Total Joint Exercises Ankle Circles/Pumps: AROM;Both;20 reps;Supine Quad Sets: AROM;10 reps;Supine;Left Heel Slides: AAROM;Left;10 reps;Supine Hip ABduction/ADduction: AAROM;Left;10 reps;Supine Straight Leg Raises: AAROM;Left;10 reps;Supine Goniometric ROM: 5-60 L  knee.   Shoulder Instructions      Home Living Family/patient expects to be discharged to:: Private residence Living Arrangements: Spouse/significant other Available Help at Discharge: Family;Available PRN/intermittently Type of Home: House Home Access: Stairs to enter [employer] of Steps: 2 Entrance Stairs-Rails: None Home Layout: Two level Alternate Level Stairs-Number of Steps: flight Alternate Level Stairs-Rails: Right Bathroom Shower/Tub: Teacher, early years/pre: Standard     Home Equipment: None          Prior Functioning/Environment Level of Independence: Independent                 OT Problem List: Impaired balance (sitting and/or standing);Pain;Decreased coordination;Decreased activity tolerance;Decreased knowledge of use of DME or AE      OT Treatment/Interventions: Self-care/ADL training;DME and/or AE instruction;Therapeutic activities;Patient/family education    OT Goals(Current goals can be found in the care plan section) Acute Rehab OT Goals Patient Stated Goal: go to rehab OT Goal Formulation: With patient Time For Goal Achievement: 08/14/20 ADL Goals Pt Will Perform Grooming: with supervision;with set-up;standing Pt Will Perform Lower Body Bathing: with min assist;sitting/lateral leans;sit to/from stand Pt Will Perform Lower Body Dressing: with min assist;sitting/lateral leans;sit to/from stand Pt Will Transfer to Toilet: with supervision;ambulating;regular height toilet;bedside commode;grab bars Pt Will Perform Toileting - Clothing Manipulation and hygiene: with min assist;with min guard assist;sit to/from stand  OT Frequency: Min 2X/week   Barriers to D/C:            Co-evaluation              AM-PAC OT "6 Clicks" Daily Activity     Outcome Measure Help from another person eating meals?: None Help from another person taking care of personal grooming?: A Little Help from another person toileting, which includes  using toliet, bedpan, or urinal?: A Lot Help from another person bathing (including washing, rinsing, drying)?: A Lot Help from another person to put on and taking off regular upper body clothing?: A Little Help from another person to put on and taking off regular lower body clothing?: A Lot 6 Click Score: 16   End of Session Equipment Utilized During Treatment: Gait belt;Rolling walker;Other (comment) (BSC)  Activity Tolerance: Patient tolerated treatment well Patient left: in bed;with call bell/phone within reach  OT Visit Diagnosis: Unsteadiness on feet (R26.81);Other abnormalities of gait and mobility (R26.89);Pain Pain - Right/Left: Left Pain - part of body: Knee                Time: 8182-9937 OT Time Calculation (min): 27 min Charges:  OT General Charges $OT Visit: 1 Visit OT Evaluation $OT Eval Low Complexity: 1 Low OT Treatments $Self Care/Home Management : 8-22 mins    Britt Bottom 07/31/2020, 2:54 PM

## 2020-07-31 NOTE — Progress Notes (Addendum)
Physical Therapy Treatment Patient Details Name: [name] MRN: [id number] DOB: 12/16/1945 Today's Date: 07/31/2020    History of Present Illness Pt is a 75 y/o female s/p L TKA on 3/28. PMH includes DM, HTN, breast cancer, and R TKA.    PT Comments    Pt supine in bed this session.  Pt continues to improve slowly.  She was able to ambulate this session.  Pt tolerated LLE exercises.  Continue to follow during acute stay.     Follow Up Recommendations  Follow surgeon's recommendation for DC plan and follow-up therapies (Pt reports plan is for SNF)     Equipment Recommendations  Rolling walker with 5" wheels;3in1 (PT) (youth height RW)    Recommendations for Other Services       Precautions / Restrictions Precautions Precautions: Knee Precaution Booklet Issued: No Precaution Comments: Verbally reviewed knee precautions. Restrictions Weight Bearing Restrictions: Yes LLE Weight Bearing:weight bearing as tolerated   Mobility  Bed Mobility Overal bed mobility: Needs Assistance Bed Mobility: Supine to Sit     Supine to sit: Min guard     General bed mobility comments: Pt able to move to edge of bed this session.  Pt required increased time and effort due to pain in LLE.    Transfers Overall transfer level: Needs assistance Equipment used: Rolling walker (2 wheeled) Transfers: Sit to/from Stand Sit to Stand: Min guard         General transfer comment: Cues for hand placement to and from seated surface.  Ambulation/Gait Ambulation/Gait assistance: Min assist Gait Distance (Feet): 8 Feet Assistive device: Rolling walker (2 wheeled) Gait Pattern/deviations: Step-to pattern;Trunk flexed;Antalgic;Decreased stride length     General Gait Details: Cues for sequencing and RW management.  Performed short bout of gt training in room.   Stairs             Wheelchair Mobility    Modified Rankin (Stroke Patients Only)       Balance Overall balance  assessment: Needs assistance Sitting-balance support: No upper extremity supported;Feet supported Sitting balance-Leahy Scale: Fair       Standing balance-Leahy Scale: Poor Standing balance comment: B UE support and external assistance.                            Cognition Arousal/Alertness: Awake/alert Behavior During Therapy: WFL for tasks assessed/performed Overall Cognitive Status: Within Functional Limits for tasks assessed                                        Exercises Total Joint Exercises Ankle Circles/Pumps: AROM;Both;20 reps;Supine Quad Sets: AROM;10 reps;Supine;Left Heel Slides: AAROM;Left;10 reps;Supine Hip ABduction/ADduction: AAROM;Left;10 reps;Supine Straight Leg Raises: AAROM;Left;10 reps;Supine Goniometric ROM: 5-60 L knee.    General Comments        Pertinent Vitals/Pain Pain Assessment: 0-10 Pain Score: 3  Pain Location: L knee Pain Descriptors / Indicators: Operative site guarding;Aching Pain Intervention(s): Monitored during session;Repositioned;Ice applied    Home Living Family/patient expects to be discharged to:: Unsure Living Arrangements: Spouse/significant other                  Prior Function            PT Goals (current goals can now be found in the care plan section) Acute Rehab PT Goals Patient Stated Goal: to go to SNF Potential to  Achieve Goals: Good Progress towards PT goals: Progressing toward goals    Frequency    7X/week      PT Plan Current plan remains appropriate    Co-evaluation              AM-PAC PT "6 Clicks" Mobility   Outcome Measure  Help needed turning from your back to your side while in a flat bed without using bedrails?: A Little Help needed moving from lying on your back to sitting on the side of a flat bed without using bedrails?: A Little Help needed moving to and from a bed to a chair (including a wheelchair)?: A Little Help needed standing up from a  chair using your arms (e.g., wheelchair or bedside chair)?: A Little Help needed to walk in hospital room?: A Little Help needed climbing 3-5 steps with a railing? : A Little 6 Click Score: 18    End of Session Equipment Utilized During Treatment: Gait belt Activity Tolerance: Treatment limited secondary to medical complications (Comment) Patient left: in bed;with call bell/phone within reach;with nursing/sitter in room Nurse Communication: Mobility status PT Visit Diagnosis: Other abnormalities of gait and mobility (R26.89);Pain Pain - Right/Left: Left Pain - part of body: Knee     Time: 1016-1040 PT Time Calculation (min) (ACUTE ONLY): 24 min  Charges:  $Gait Training: 8-22 mins $Therapeutic Exercise: 8-22 mins                     Bonney Leitz , PTA Acute Rehabilitation Services Pager [phone] Office [phone]     Stefany Starace Artis Delay 07/31/2020, 12:57 PM

## 2020-07-31 NOTE — Progress Notes (Addendum)
Physical Therapy Treatment Patient Details Name: [name] MRN: [id number] DOB: 1945/05/20 Today's Date: 07/31/2020    History of Present Illness Pt is a 75 y/o female s/p L TKA on 3/28. PMH includes DM, HTN, breast cancer, and R TKA.    PT Comments    Pt supine in bed on arrival this session.  Pt reports feeling tired and painful.  Requested pain meds from nurse during session.  When patient returned back to room she was in seated position edge of bed and began to vomit.  Pt returned to bed with min assistance.  RN aware.  HEP issued this session.      Follow Up Recommendations  Follow surgeon's recommendation for DC plan and follow-up therapies     Equipment Recommendations  Rolling walker with 5" wheels;3in1 (PT) (youth Height RW)    Recommendations for Other Services       Precautions / Restrictions Precautions Precautions: Knee Precaution Booklet Issued: Yes (comment) Precaution Comments: Verbally reviewed knee precautions. Restrictions Weight Bearing Restrictions: Yes LLE Weight Bearing: Weight bearing as tolerated    Mobility  Bed Mobility Overal bed mobility: Needs Assistance Bed Mobility: Supine to Sit;Sit to Supine     Supine to sit: Supervision Sit to supine: Min assist   General bed mobility comments: Pt required increased time and effort to move to edge of bed.  Increased assistance to return back to bed after bouts of vomitting.    Transfers Overall transfer level: Needs assistance Equipment used: Rolling walker (2 wheeled) Transfers: Sit to/from Stand Sit to Stand: Min guard         General transfer comment: Cues for hand placement  Ambulation/Gait Ambulation/Gait assistance: Min guard Gait Distance (Feet): 40 Feet Assistive device: Rolling walker (2 wheeled) Gait Pattern/deviations: Step-to pattern;Trunk flexed;Antalgic;Decreased stride length;Step-through pattern     General Gait Details: Cues for sequencing and RW management.   Performed increased distance of gt training in halls.  Able to progress to step through pattern.   Stairs             Wheelchair Mobility    Modified Rankin (Stroke Patients Only)       Balance Overall balance assessment: Needs assistance Sitting-balance support: No upper extremity supported;Feet supported Sitting balance-Leahy Scale: Fair     Standing balance support: Bilateral upper extremity supported;During functional activity Standing balance-Leahy Scale: Poor                              Cognition Arousal/Alertness: Awake/alert Behavior During Therapy: WFL for tasks assessed/performed Overall Cognitive Status: Within Functional Limits for tasks assessed                                        Exercises Total Joint Exercises Ankle Circles/Pumps: AROM;Both;20 reps;Supine Quad Sets: AROM;10 reps;Supine;Left Heel Slides: AAROM;Left;10 reps;Supine Hip ABduction/ADduction: AAROM;Left;10 reps;Supine Straight Leg Raises: AAROM;Left;10 reps;Supine Goniometric ROM: 5-66 L knee ROM.    General Comments        Pertinent Vitals/Pain Pain Assessment: 0-10 Pain Score: 4  Pain Location: L knee Pain Descriptors / Indicators: Operative site guarding;Aching Pain Intervention(s): Monitored during session;Repositioned    Home Living Family/patient expects to be discharged to:: Private residence Living Arrangements: Spouse/significant other Available Help at Discharge: Family;Available PRN/intermittently Type of Home: House Home Access: Stairs to enter Entrance Stairs-Rails: None Home Layout: Two  level Home Equipment: None      Prior Function Level of Independence: Independent          PT Goals (current goals can now be found in the care plan section) Acute Rehab PT Goals Patient Stated Goal: go to rehab Potential to Achieve Goals: Good Progress towards PT goals: Progressing toward goals    Frequency    7X/week      PT  Plan Current plan remains appropriate    Co-evaluation              AM-PAC PT "6 Clicks" Mobility   Outcome Measure  Help needed turning from your back to your side while in a flat bed without using bedrails?: A Little Help needed moving from lying on your back to sitting on the side of a flat bed without using bedrails?: A Little Help needed moving to and from a bed to a chair (including a wheelchair)?: A Little Help needed standing up from a chair using your arms (e.g., wheelchair or bedside chair)?: A Little Help needed to walk in hospital room?: A Little Help needed climbing 3-5 steps with a railing? : A Little 6 Click Score: 18    End of Session Equipment Utilized During Treatment: Gait belt Activity Tolerance: Treatment limited secondary to medical complications (Comment) Patient left: in bed;with call bell/phone within reach;with nursing/sitter in room Nurse Communication: Mobility status (need for nausea meds as she began to vomit sitting edge of bed.) PT Visit Diagnosis: Other abnormalities of gait and mobility (R26.89);Pain Pain - Right/Left: Left Pain - part of body: Knee     Time: 1411-1444 PT Time Calculation (min) (ACUTE ONLY): 33 min  Charges:  $Gait Training: 8-22 mins $Therapeutic Exercise: 8-22 mins                     Bonney Leitz , PTA Acute Rehabilitation Services Pager [phone] Office [phone]     Corryn Madewell Artis Delay 07/31/2020, 5:23 PM

## 2020-07-31 NOTE — Progress Notes (Signed)
Had discussion with patient and husband about going home vs SNF.  I have recommended going home and associated benefits vs going to a SNF.  They will talk about it tonight and let us know in the morning.

## 2020-07-31 NOTE — Progress Notes (Signed)
Catheter removed 07/31/2020 at 6:45am.

## 2020-07-31 NOTE — Progress Notes (Signed)
Patient eating, unable to complete admission documentation at this time.

## 2020-08-01 DIAGNOSIS — M1712 Unilateral primary osteoarthritis, left knee: Secondary | ICD-10-CM | POA: Diagnosis not present

## 2020-08-01 LAB — SARS CORONAVIRUS 2 (TAT 6-24 HRS): SARS Coronavirus 2: NEGATIVE

## 2020-08-01 LAB — GLUCOSE, CAPILLARY
Glucose-Capillary: 136 mg/dL — ABNORMAL HIGH (ref 70–99)
Glucose-Capillary: 180 mg/dL — ABNORMAL HIGH (ref 70–99)

## 2020-08-01 NOTE — Progress Notes (Signed)
Physical Therapy Treatment Patient Details Name: [name] MRN: [id number] DOB: [birth year] Today's Date: 08/01/2020    History of Present Illness Pt is a 75 y/o female s/p L TKA on 3/28. PMH includes DM, HTN, breast cancer, and R TKA.    PT Comments    Pt seated in recliner on arrival this session reports pain 4/10 pre tx and 5/10 post tx.  Pt continues to benefit from skilled rehab during acute stay.  Will f/u this pm for stair training before d/c home.    Follow Up Recommendations  Follow surgeon's recommendation for DC plan and follow-up therapies     Equipment Recommendations  Rolling walker with 5" wheels;3in1 (PT) (youth RW)    Recommendations for Other Services       Precautions / Restrictions Precautions Precautions: Knee Precaution Booklet Issued: Yes (comment) Precaution Comments: Verbally reviewed knee precautions. Restrictions Weight Bearing Restrictions: Yes LLE Weight Bearing: Weight bearing as tolerated    Mobility  Bed Mobility Overal bed mobility: Needs Assistance Bed Mobility: Sit to Supine       Sit to supine: Min assist   General bed mobility comments: Min assistance to lift LLE back to bed.    Transfers Overall transfer level: Needs assistance Equipment used: Rolling walker (2 wheeled) Transfers: Sit to/from Stand Sit to Stand: Min guard         General transfer comment: Cues for hand placement and forward weight shifting.  Posterior translation noted with cues to correct.  Ambulation/Gait Ambulation/Gait assistance: Min guard Gait Distance (Feet): 55 Feet Assistive device: Rolling walker (2 wheeled) Gait Pattern/deviations: Step-to pattern;Trunk flexed;Antalgic;Decreased stride length     General Gait Details: Pt regressed to step through pattern due to pain this session .  Cues for forward gaze and upper trunk control.   Stairs             Wheelchair Mobility    Modified Rankin (Stroke Patients Only)        Balance Overall balance assessment: Needs assistance Sitting-balance support: No upper extremity supported;Feet supported Sitting balance-Leahy Scale: Fair       Standing balance-Leahy Scale: Poor Standing balance comment: B UE support and external assistance.                            Cognition Arousal/Alertness: Awake/alert Behavior During Therapy: WFL for tasks assessed/performed Overall Cognitive Status: Within Functional Limits for tasks assessed                                        Exercises Total Joint Exercises Ankle Circles/Pumps: AROM;Both;20 reps;Supine Quad Sets: AROM;10 reps;Supine;Left Heel Slides: AAROM;Left;10 reps;Supine Hip ABduction/ADduction: AAROM;Left;10 reps;Supine Straight Leg Raises: AAROM;Left;10 reps;Supine Goniometric ROM: will measure this pm.    General Comments        Pertinent Vitals/Pain Pain Assessment: 0-10 Faces Pain Scale: Hurts whole lot Pain Location: L knee Pain Descriptors / Indicators: Operative site guarding;Aching Pain Intervention(s): Monitored during session;Repositioned    Home Living                      Prior Function            PT Goals (current goals can now be found in the care plan section) Acute Rehab PT Goals Patient Stated Goal: go to rehab Potential to Achieve Goals: Good Progress towards  PT goals: Progressing toward goals    Frequency    7X/week      PT Plan Current plan remains appropriate    Co-evaluation              AM-PAC PT "6 Clicks" Mobility   Outcome Measure  Help needed turning from your back to your side while in a flat bed without using bedrails?: A Little Help needed moving from lying on your back to sitting on the side of a flat bed without using bedrails?: A Little Help needed moving to and from a bed to a chair (including a wheelchair)?: A Little Help needed standing up from a chair using your arms (e.g., wheelchair or bedside  chair)?: A Little Help needed to walk in hospital room?: A Little Help needed climbing 3-5 steps with a railing? : A Little 6 Click Score: 18    End of Session Equipment Utilized During Treatment: Gait belt Activity Tolerance: Treatment limited secondary to medical complications (Comment) Patient left: in bed;with call bell/phone within reach;with nursing/sitter in room Nurse Communication: Mobility status (reports she is going home instead of snf) PT Visit Diagnosis: Other abnormalities of gait and mobility (R26.89);Pain Pain - Right/Left: Left Pain - part of body: Knee     Time: 3664-4034 PT Time Calculation (min) (ACUTE ONLY): 29 min  Charges:  $Gait Training: 8-22 mins $Therapeutic Exercise: 8-22 mins                     Bonney Leitz , PTA Acute Rehabilitation Services Pager [phone] Office [phone]     Gennell How Artis Delay 08/01/2020, 10:47 AM

## 2020-08-01 NOTE — Discharge Summary (Signed)
Patient ID: [name] MRN: [id number] DOB/AGE: 05-14-1945 75 y.o.  Admit date: 07/30/2020 Discharge date: 08/01/2020  Admission Diagnoses:  Principal Problem:   Primary osteoarthritis of left knee Active Problems:   Status post total left knee replacement   Discharge Diagnoses:  Same  Past Medical History:  Diagnosis Date  . Allergy   . Anemia   . Breast CA ([place])    (Rt) breast ca dx 4/12---  S/P RADICAL MASTECTOMY AND CHEMORADIATION  . Diabetes mellitus ORAL MED  . Fatty liver disease, nonalcoholic   . History of hyperkalemia   . Hx antineoplastic chemo  10/12 - 06/24/11   PACLITAX EL COMPLETED 06/24/11  . Hyperlipidemia   . Hypertension   . Mild nonproliferative diabetic retinopathy of right eye ([place]) 03/19/11   Dr. Joseph Art  . Numbness and tingling    Hx: of in fingers and toes since chemotherapy  . OA (osteoarthritis) of knee    RIGHT LEG  . Obesity   . Renal lesion     Surgeries: Procedure(s): LEFT TOTAL KNEE ARTHROPLASTY on 07/30/2020   Consultants:   Discharged Condition: Improved  Hospital Course: [name] is an 75 y.o. female who was admitted 07/30/2020 for operative treatment ofPrimary osteoarthritis of left knee. Patient has severe unremitting pain that affects sleep, daily activities, and work/hobbies. After pre-op clearance the patient was taken to the operating room on 07/30/2020 and underwent  Procedure(s): LEFT TOTAL KNEE ARTHROPLASTY.    Patient was given perioperative antibiotics:  Anti-infectives (From admission, onward)   Start     Dose/Rate Route Frequency Ordered Stop   07/30/20 1330  ceFAZolin (ANCEF) IVPB 2g/100 mL premix        2 g 200 mL/hr over 30 Minutes Intravenous Every 6 hours 07/30/20 1034 07/30/20 2025   07/30/20 1200  ciprofloxacin (CIPRO) tablet 500 mg        500 mg Oral 2 times daily 07/30/20 1034 07/30/20 2124   07/30/20 0719  vancomycin (VANCOCIN) powder  Status:  Discontinued          As needed 07/30/20  0719 07/30/20 0934   07/30/20 0600  ceFAZolin (ANCEF) IVPB 2g/100 mL premix        2 g 200 mL/hr over 30 Minutes Intravenous On call to O.R. 07/30/20 0544 07/30/20 0741       Patient was given sequential compression devices, early ambulation, and chemoprophylaxis to prevent DVT.  Patient benefited maximally from hospital stay and there were no complications.    Recent vital signs:  Patient Vitals for the past 24 hrs:  BP Temp Temp src Pulse Resp SpO2  08/01/20 0428 (!) 168/82 98.2 F (36.8 C) Oral 90 16 100 %  07/31/20 2007 (!) 164/76 99.5 F (37.5 C) Oral 83 16 100 %  07/31/20 1538 (!) 157/70 98.2 F (36.8 C) Oral 81 20 100 %     Recent laboratory studies:  Recent Labs    07/30/20 0646 07/31/20 0655  WBC  --  6.8  HGB 11.6* 10.6*  HCT 34.0* 32.2*  PLT  --  213  NA 143 139  K 3.4* 3.4*  CL 103 103  CO2  --  26  BUN 10 7*  CREATININE 0.70 0.87  GLUCOSE 127* 124*  CALCIUM  --  8.5*     Discharge Medications:   Allergies as of 08/01/2020      Reactions   Hctz [hydrochlorothiazide] Other (See Comments)   Pancreatitis      Medication List  STOP taking these medications   acetaminophen 500 MG tablet Commonly known as: TYLENOL   ciprofloxacin 500 MG tablet Commonly known as: Cipro   meloxicam 7.5 MG tablet Commonly known as: Mobic   potassium chloride 10 MEQ tablet Commonly known as: KLOR-CON     TAKE these medications   Accu-Chek Guide test strip Generic drug: glucose blood Check blood sugar up to three times daily.   Accu-Chek Softclix Lancets lancets Check blood sugar up to three times daily.   aspirin EC 81 MG tablet Take 1 tablet (81 mg total) by mouth 2 (two) times daily. To be taken after surgery   atorvastatin 40 MG tablet Commonly known as: LIPITOR TAKE 1 TABLET BY MOUTH EVERYDAY AT BEDTIME What changed: See the new instructions.   cephALEXin 500 MG capsule Commonly known as: Keflex Take 1 capsule (500 mg total) by mouth 4 (four)  times daily.   chlorthalidone 25 MG tablet Commonly known as: HYGROTON TAKE 1 TABLET BY MOUTH EVERY DAY   docusate sodium 100 MG capsule Commonly known as: Colace Take 1 capsule (100 mg total) by mouth daily as needed.   empagliflozin 10 MG Tabs tablet Commonly known as: JARDIANCE Take 1 tablet (10 mg total) by mouth daily.   fluticasone 50 MCG/ACT nasal spray Commonly known as: Flonase Place 2 sprays into both nostrils daily. What changed:   when to take this  reasons to take this   lisinopril 20 MG tablet Commonly known as: ZESTRIL TAKE 1 TABLET BY MOUTH EVERY DAY   metFORMIN 500 MG 24 hr tablet Commonly known as: GLUCOPHAGE-XR Take 2 tablets (1,000 mg total) by mouth daily with breakfast.   methocarbamol 500 MG tablet Commonly known as: Robaxin Take 1 tablet (500 mg total) by mouth 2 (two) times daily as needed. To be taken after surgery   ondansetron 4 MG tablet Commonly known as: Zofran Take 1 tablet (4 mg total) by mouth every 8 (eight) hours as needed for nausea or vomiting.   oxyCODONE-acetaminophen 5-325 MG tablet Commonly known as: Percocet Take 1-2 tablets by mouth every 6 (six) hours as needed. To be taken after surgery   Systane 0.4-0.3 % Soln Generic drug: Polyethyl Glycol-Propyl Glycol Place 1 drop into both eyes daily as needed (Dry eyes).            Durable Medical Equipment  (From admission, onward)         Start     Ordered   07/30/20 1035  DME Walker rolling  Once       Question:  Patient needs a walker to treat with the following condition  Answer:  Total knee replacement status   07/30/20 1034   07/30/20 1035  DME 3 n 1  Once        07/30/20 1034   07/30/20 1035  DME Bedside commode  Once       Question:  Patient needs a bedside commode to treat with the following condition  Answer:  Total knee replacement status   07/30/20 1034          Diagnostic Studies: DG Chest 2 View  Result Date: 07/28/2020 CLINICAL DATA:   Preoperative exam. EXAM: CHEST - 2 VIEW COMPARISON:  August 11, 2019 FINDINGS: The heart, hila, mediastinum, and pleura are normal. Nodular density carbon denser than adjacent bone, over the lateral left lung base is similar since 2013 and nonacute. No suspicious nodules or masses. No other acute abnormalities. IMPRESSION: No active cardiopulmonary disease. Electronically Signed  By: Dorise Bullion III M.D   On: 07/28/2020 13:00   DG Knee Left Port  Result Date: 07/30/2020 CLINICAL DATA:  Postop from left knee arthroplasty. EXAM: PORTABLE LEFT KNEE - 1-2 VIEW COMPARISON:  06/19/2020 FINDINGS: Total knee prosthesis is seen with all 3 components in expected position. No evidence of fracture or dislocation. No other osseous abnormality identified. IMPRESSION: Expected postop appearance of left knee prosthesis. No acute findings. Electronically Signed   By: Marlaine Hind M.D.   On: 07/30/2020 10:56    Disposition: Discharge disposition: 03-Skilled [place]    Leandrew Koyanagi, MD In 2 weeks.   Specialty: Orthopedic Surgery Why: For suture removal, For wound re-check Contact information: [place] [address] [phone]        Macarthur Critchley .   Contact information: [address] [place] [address] [phone]                Signed: Aundra Dubin 08/01/2020, 8:23 AM

## 2020-08-01 NOTE — Progress Notes (Signed)
Physical Therapy Treatment Patient Details Name: [name] MRN: [id number] DOB: [birth year] Today's Date: 08/01/2020    History of Present Illness Pt is a 75 y/o female s/p L TKA on 3/28. PMH includes DM, HTN, breast cancer, and R TKA.    PT Comments    Followed up for pm session.  Focused on stair training.  Handout issued for technique as no family in room during stair training.  Pt is ready for d/c home.  She will require assistance from spouse as she continues to have balance issues.  Informed nursing all information from a PT stand point has been covered.      Follow Up Recommendations  Follow surgeon's recommendation for DC plan and follow-up therapies     Equipment Recommendations  Rolling walker with 5" wheels;3in1 (PT) (youth height RW)    Recommendations for Other Services       Precautions / Restrictions Precautions Precautions: Knee Precaution Booklet Issued: Yes (comment) Precaution Comments: Verbally reviewed knee precautions. Restrictions Weight Bearing Restrictions: Yes LLE Weight Bearing: Weight bearing as tolerated    Mobility  Bed Mobility Overal bed mobility: Needs Assistance Bed Mobility: Sit to Supine       Sit to supine: Min assist   General bed mobility comments: Min assistance to lift LLE back to bed.    Transfers Overall transfer level: Needs assistance Equipment used: Rolling walker (2 wheeled) Transfers: Sit to/from Stand Sit to Stand: Min guard;Min assist         General transfer comment: Cues for hand placement and forward weight shifting.  Posterior translation noted with cues to correct. LOB this session moving from recliner require min assistance to correct.  Ambulation/Gait Ambulation/Gait assistance: Min guard Gait Distance (Feet): 60 Feet Assistive device: Rolling walker (2 wheeled) Gait Pattern/deviations: Step-to pattern;Trunk flexed;Antalgic;Decreased stride length;Step-through pattern     General Gait  Details: Pt able to return to step through pattern this session with improvement in gt speed.  She remains to improve slowly.   Stairs Stairs: Yes Stairs assistance: Min assist Stair Management: No rails;Backwards;Forwards Number of Stairs: 2 General stair comments: Cues for sequencing and RW position.  Issued hand out for home use.   Wheelchair Mobility    Modified Rankin (Stroke Patients Only)       Balance Overall balance assessment: Needs assistance Sitting-balance support: No upper extremity supported;Feet supported Sitting balance-Leahy Scale: Fair       Standing balance-Leahy Scale: Poor Standing balance comment: B UE support and external assistance.                            Cognition Arousal/Alertness: Awake/alert Behavior During Therapy: WFL for tasks assessed/performed Overall Cognitive Status: Within Functional Limits for tasks assessed                                        Exercises Total Joint Exercises Goniometric ROM: 10-66 L knee.  No improvement from measurement yesterday.  However she is maintaining.    General Comments        Pertinent Vitals/Pain Pain Assessment: 0-10 Pain Score: 4  Faces Pain Scale: Hurts whole lot Pain Location: L knee Pain Descriptors / Indicators: Operative site guarding;Aching Pain Intervention(s): Monitored during session;Repositioned    Home Living  Prior Function            PT Goals (current goals can now be found in the care plan section) Acute Rehab PT Goals Patient Stated Goal: go to rehab Potential to Achieve Goals: Good Progress towards PT goals: Progressing toward goals    Frequency    7X/week      PT Plan Current plan remains appropriate    Co-evaluation              AM-PAC PT "6 Clicks" Mobility   Outcome Measure  Help needed turning from your back to your side while in a flat bed without using bedrails?: A Little Help  needed moving from lying on your back to sitting on the side of a flat bed without using bedrails?: A Little Help needed moving to and from a bed to a chair (including a wheelchair)?: A Little Help needed standing up from a chair using your arms (e.g., wheelchair or bedside chair)?: A Little Help needed to walk in hospital room?: A Little Help needed climbing 3-5 steps with a railing? : A Little 6 Click Score: 18    End of Session Equipment Utilized During Treatment: Gait belt Activity Tolerance: Treatment limited secondary to medical complications (Comment) Patient left: in bed;with call bell/phone within reach;with nursing/sitter in room Nurse Communication: Mobility status PT Visit Diagnosis: Other abnormalities of gait and mobility (R26.89);Pain Pain - Right/Left: Left Pain - part of body: Knee     Time: 1191-4782 PT Time Calculation (min) (ACUTE ONLY): 24 min  Charges:  $Gait Training: 8-22 mins  $Therapeutic Activity: 8-22 mins                     Bonney Leitz , PTA Acute Rehabilitation Services Pager [phone] Office [phone]     [name] Delay 08/01/2020, 2:18 PM

## 2020-08-01 NOTE — Progress Notes (Signed)
Subjective: 2 Days Post-Op Procedure(s) (LRB): LEFT TOTAL KNEE ARTHROPLASTY (Left) Patient reports pain as mild.    Objective: Vital signs in last 24 hours: Temp:  [98.2 F (36.8 C)-99.5 F (37.5 C)] 98.2 F (36.8 C) (03/30 0428) Pulse Rate:  [81-90] 90 (03/30 0428) Resp:  [16-20] 16 (03/30 0428) BP: (157-168)/(70-82) 168/82 (03/30 0428) SpO2:  [100 %] 100 % (03/30 0428)  Intake/Output from previous day: 03/29 0701 - 03/30 0700 In: 240 [P.O.:240] Out: 200 [Urine:200] Intake/Output this shift: No intake/output data recorded.  Recent Labs    07/30/20 0646 07/31/20 0655  HGB 11.6* 10.6*   Recent Labs    07/30/20 0646 07/31/20 0655  WBC  --  6.8  RBC  --  3.40*  HCT 34.0* 32.2*  PLT  --  213   Recent Labs    07/30/20 0646 07/31/20 0655  NA 143 139  K 3.4* 3.4*  CL 103 103  CO2  --  26  BUN 10 7*  CREATININE 0.70 0.87  GLUCOSE 127* 124*  CALCIUM  --  8.5*   No results for input(s): LABPT, INR in the last 72 hours.  Neurologically intact Neurovascular intact Sensation intact distally Intact pulses distally Dorsiflexion/Plantar flexion intact Incision: dressing C/D/I No cellulitis present Compartment soft   Assessment/Plan: 2 Days Post-Op Procedure(s) (LRB): LEFT TOTAL KNEE ARTHROPLASTY (Left) Advance diet Up with therapy Discharge to SNF vs home (patient thinking snf this am), but will see how she mobilizes with PT WBAT LLE    Anticipated LOS equal to or greater than 2 midnights due to - Age 73 and older with one or more of the following:  - Obesity  - Expected need for hospital services (PT, OT, Nursing) required for safe  discharge  - Anticipated need for postoperative skilled nursing care or inpatient rehab  - Active co-morbidities: Diabetes OR   - Unanticipated findings during/Post Surgery: Slow post-op progression: GI, pain control, mobility  - Patient is a high risk of re-admission due to: Barriers to post-acute care (logistical, no  family support in home)    Aundra Dubin 08/01/2020, 8:21 AM

## 2020-08-01 NOTE — Progress Notes (Signed)
Patient refusing cpm anymore this evening, did allow staff to place cold therapy to left knee.

## 2020-08-01 NOTE — TOC Initial Note (Signed)
Transition of Care [facility]) - Initial/Assessment Note    Patient Details  Name: [name] MRN: [id number] Date of Birth: 12/24/1945  Transition of Care [facility]) CM/SW Contact:    Marilu Favre, RN Phone Number: 08/01/2020, 9:50 AM  Clinical Narrative:                 Patient from home with husband, per patient husband has taken some time off from work and she prefers to go home home with home health.   Patient was prearranged with [facility]. [place] with [facility] aware.   Patient needing youth walker and 3 in1 , ordered through [place] with [employer], he will deliver DME to her home today . Patient aware.  Expected Discharge Plan: [place] Barriers to Discharge: No Barriers Identified   Patient Goals and CMS Choice Patient states their goals for this hospitalization and ongoing recovery are:: to return to home CMS Medicare.gov Compare Post Acute Care list provided to:: Patient Choice offered to / list presented to : Patient  Expected Discharge Plan and Services Expected Discharge Plan: [place]   Discharge Planning Services: CM Consult Post Acute Care Choice: Home Health,Durable Medical Equipment Living arrangements for the past 2 months: Single Family Home Expected Discharge Date: 08/01/20               DME Arranged: 3-N-1,Walker rolling DME Agency: Other - Comment Date DME Agency Contacted: 08/01/20 Time DME Agency Contacted: [phone] Representative spoke with at DME Agency: [place] Arranged: PT [place]: [facility] (now [facility]) Date [place]: 08/01/20 Time [place]: [phone] Representative spoke with at [place]: [place]  Prior Living Arrangements/Services Living arrangements for the past 2 months: [place] with:: Spouse Patient language and need for interpreter reviewed:: Yes Do you feel safe going back to the place where you live?: Yes      Need for  Family Participation in Patient Care: Yes (Comment) Care giver support system in place?: Yes (comment)   Criminal Activity/Legal Involvement Pertinent to Current Situation/Hospitalization: No - Comment as needed  Activities of Daily Living Home Assistive Devices/Equipment: Eyeglasses ADL Screening (condition at time of admission) Patient's cognitive ability adequate to safely complete daily activities?: Yes Is the patient deaf or have difficulty hearing?: No Does the patient have difficulty seeing, even when wearing glasses/contacts?: No Does the patient have difficulty concentrating, remembering, or making decisions?: No Patient able to express need for assistance with ADLs?: Yes Does the patient have difficulty dressing or bathing?: No Independently performs ADLs?: Yes (appropriate for developmental age) Does the patient have difficulty walking or climbing stairs?: Yes Weakness of Legs: Left Weakness of Arms/Hands: None  Permission Sought/Granted Permission sought to share information with : Facility Art therapist granted to share information with : No     Permission granted to share info w AGENCY: SNF        Emotional Assessment Appearance:: Appears stated age Attitude/Demeanor/Rapport: Engaged Affect (typically observed): Accepting Orientation: : Oriented to Situation,Oriented to  Time,Oriented to Place,Oriented to Self Alcohol / Substance Use: Not Applicable Psych Involvement: No (comment)  Admission diagnosis:  Status post total left knee replacement [Z96.652] Patient Active Problem List   Diagnosis Date Noted  . Status post total left knee replacement 07/30/2020  . Fatigue 02/10/2019  . Primary osteoarthritis of left knee 01/25/2019  . Left carpal tunnel syndrome 01/25/2019  . Cervical neuropathy 08/20/2018  .  Bilateral carpal tunnel syndrome 06/29/2018  . Depressed mood 01/21/2018  . Need for immunization against influenza 01/17/2018  .  Osteopenia 12/13/2013  . Vaginal dryness 12/13/2013  . Peripheral neuropathy 11/02/2013  . Health care maintenance 11/17/2012  . Arthritis of knee, right 08/16/2012  . Breast cancer, right breast ([place]) 09/25/2010  . COLONIC POLYPS, HX OF 07/19/2007  . OBESITY NOS 07/09/2006  . Type 2 diabetes mellitus with renal manifestations ([place]) 03/10/2006  . Hyperlipidemia 03/10/2006  . Hypertension associated with diabetes ([place]) 03/10/2006  . ALLERGIC RHINITIS 03/10/2006  . Fatty liver 03/10/2006   PCP:  Riesa Pope, MD Pharmacy:   [id number] - [place], [address] [place] [address] Phone: [phone] Fax: [phone]     Social Determinants of Health (SDOH) Interventions    Readmission Risk Interventions No flowsheet data found.

## 2020-08-06 ENCOUNTER — Telehealth: Payer: Self-pay

## 2020-08-06 ENCOUNTER — Other Ambulatory Visit: Payer: Self-pay | Admitting: Physician Assistant

## 2020-08-06 NOTE — Telephone Encounter (Signed)
Pt called asking if she can have some other medication sent in. She went to pick up her medication and it was $100 for 30 she said she can't afford that at this time.  Please advise

## 2020-08-06 NOTE — Telephone Encounter (Signed)
Pls advise.  

## 2020-08-06 NOTE — Telephone Encounter (Signed)
Looks like colace was the only thing I sent in that was #30. Is this the medication that she was referring to?

## 2020-08-07 ENCOUNTER — Telehealth: Payer: Self-pay

## 2020-08-07 NOTE — Telephone Encounter (Signed)
IC LMVM for patient to call us back to clarify and let us know specifically the name of the medication she is referring to.

## 2020-08-07 NOTE — Telephone Encounter (Signed)
I tried calling.  No answer.  This medication was not prescribed by [place].

## 2020-08-07 NOTE — Telephone Encounter (Signed)
patient called regarding vm that was left for her she stated the medication she is referring to is empagliflozin (JARDIANCE) 10 MG TABS tablet [[id number]] call back:[phone]

## 2020-08-14 ENCOUNTER — Other Ambulatory Visit: Payer: Self-pay

## 2020-08-14 ENCOUNTER — Ambulatory Visit (INDEPENDENT_AMBULATORY_CARE_PROVIDER_SITE_OTHER): Payer: Medicare Other | Admitting: Physician Assistant

## 2020-08-14 ENCOUNTER — Encounter: Payer: Self-pay | Admitting: Orthopaedic Surgery

## 2020-08-14 DIAGNOSIS — Z96652 Presence of left artificial knee joint: Secondary | ICD-10-CM

## 2020-08-14 NOTE — Progress Notes (Signed)
Post-Op Visit Note   Patient: [name]           Date of Birth: 1946-04-04           MRN: [id number] Visit Date: 08/14/2020 PCP: Riesa Pope, MD   Assessment & Plan:  Chief Complaint:  Chief Complaint  Patient presents with  . Left Knee - Pain   Visit Diagnoses:  1. Hx of total knee replacement, left     Plan: Patient is a pleasant 75 year old female who comes in today 2 weeks out left total knee replacement.  She has been doing well.  She has been getting home health physical therapy and is ambulating with a walker.  Examination of her left knee reveals a well-healing surgical incision with nylon sutures in place.  No evidence of infection or cellulitis.  Calf is soft nontender.  She is neurovascularly intact distally.  Today, sutures were removed and Steri-Strips applied.  We have provided her with a single-point cane to transition to.  I have also sent in a referral for physical therapy.  Follow-up with [place] in 4 weeks time for repeat evaluation and 2 view x-rays of left knee.  Follow-Up Instructions: Return in about 4 weeks (around 09/11/2020).   Orders:  Orders Placed This Encounter  Procedures  . Ambulatory referral to Physical Therapy   No orders of the defined types were placed in this encounter.   Imaging: No new imaging  PMFS History: Patient Active Problem List   Diagnosis Date Noted  . Status post total left knee replacement 07/30/2020  . Fatigue 02/10/2019  . Primary osteoarthritis of left knee 01/25/2019  . Left carpal tunnel syndrome 01/25/2019  . Cervical neuropathy 08/20/2018  . Bilateral carpal tunnel syndrome 06/29/2018  . Depressed mood 01/21/2018  . Need for immunization against influenza 01/17/2018  . Osteopenia 12/13/2013  . Vaginal dryness 12/13/2013  . Peripheral neuropathy 11/02/2013  . Health care maintenance 11/17/2012  . Arthritis of knee, right 08/16/2012  . Breast cancer, right breast ([place]) 09/25/2010  . COLONIC  POLYPS, HX OF 07/19/2007  . OBESITY NOS 07/09/2006  . Type 2 diabetes mellitus with renal manifestations ([place]) 03/10/2006  . Hyperlipidemia 03/10/2006  . Hypertension associated with diabetes ([place]) 03/10/2006  . ALLERGIC RHINITIS 03/10/2006  . Fatty liver 03/10/2006   Past Medical History:  Diagnosis Date  . Allergy   . Anemia   . Breast CA ([place])    (Rt) breast ca dx 4/12---  S/P RADICAL MASTECTOMY AND CHEMORADIATION  . Diabetes mellitus ORAL MED  . Fatty liver disease, nonalcoholic   . History of hyperkalemia   . Hx antineoplastic chemo  10/12 - 06/24/11   PACLITAX EL COMPLETED 06/24/11  . Hyperlipidemia   . Hypertension   . Mild nonproliferative diabetic retinopathy of right eye ([place]) 03/19/11   Dr. Joseph Art  . Numbness and tingling    Hx: of in fingers and toes since chemotherapy  . OA (osteoarthritis) of knee    RIGHT LEG  . Obesity   . Renal lesion     Family History  Problem Relation Age of Onset  . Stroke Father   . Heart disease Sister   . Cancer Sister        breast  . Heart disease Brother   . Cancer Sister        COLON CANCER    Past Surgical History:  Procedure Laterality Date  . COLONOSCOPY W/ POLYPECTOMY     Hx; of  . I &  D KNEE WITH POLY EXCHANGE Right 09/03/2012   Procedure: IRRIGATION AND DEBRIDEMENT RIGHT KNEE WITH POLY EXCHANGE;  Surgeon: Kerin Salen, MD;  Location: [place];  Service: Orthopedics;  Laterality: Right;  . JOINT REPLACEMENT    . knee arthroscopy  10-28-1999   RIGHT  . Laparoscopy with laparoscopic right salpingo oophorectomy and lysis of pelvic and abdominal adhesions  March 2013   Dr. Nori Riis   . LEFT TOTAL KNEE ARTHROPLASTY (Left Knee)  07/30/2020  . MASTECTOMY MODIFIED RADICAL  12-04-2010   W/ LEFT PAC PLACEMENT (RIGHT BREAST W/ AXILLARY CONTENTS/ NODE BX'S  . PORT-A-CATH REMOVAL  09/24/2011   Procedure: REMOVAL PORT-A-CATH;  Surgeon: Joyice Faster. Cornett, MD;  Location: [facility];  Service: General;  Laterality: N/A;  . PORTACATH  PLACEMENT     REPLACED PAC DUE TO MALFUNCTION (LEFT)  . TOTAL KNEE ARTHROPLASTY Right 08/16/2012   Procedure: TOTAL KNEE ARTHROPLASTY;  Surgeon: Kerin Salen, MD;  Location: [place];  Service: Orthopedics;  Laterality: Right;  . TOTAL KNEE ARTHROPLASTY Left 07/30/2020   Procedure: LEFT TOTAL KNEE ARTHROPLASTY;  Surgeon: Leandrew Koyanagi, MD;  Location: [place];  Service: Orthopedics;  Laterality: Left;  . TRANSTHORACIC ECHOCARDIOGRAM  10-29-2010   NORMAL LVF/ EF 55-60%/ MILD MV REGURG  . TUBAL LIGATION  YRS AGO  . VAGINAL HYSTERECTOMY  AGE 29   W/ LSO   Social History   Occupational History  . Not on file  Tobacco Use  . Smoking status: Never Smoker  . Smokeless tobacco: Never Used  Vaping Use  . Vaping Use: Never used  Substance and Sexual Activity  . Alcohol use: No    Alcohol/week: 0.0 standard drinks  . Drug use: No    Types: Flunitrazepam  . Sexual activity: Not on file    Comment: MENARC[place] AGE 43, G4, P4, [place] AGE 64, HRT 20+ YEARS

## 2020-08-16 ENCOUNTER — Ambulatory Visit (INDEPENDENT_AMBULATORY_CARE_PROVIDER_SITE_OTHER): Payer: Medicare Other | Admitting: Physical Therapy

## 2020-08-16 ENCOUNTER — Other Ambulatory Visit: Payer: Self-pay

## 2020-08-16 ENCOUNTER — Encounter: Payer: Self-pay | Admitting: Physical Therapy

## 2020-08-16 DIAGNOSIS — M25662 Stiffness of left knee, not elsewhere classified: Secondary | ICD-10-CM

## 2020-08-16 DIAGNOSIS — M25562 Pain in left knee: Secondary | ICD-10-CM

## 2020-08-16 DIAGNOSIS — R2689 Other abnormalities of gait and mobility: Secondary | ICD-10-CM

## 2020-08-16 DIAGNOSIS — R600 Localized edema: Secondary | ICD-10-CM | POA: Diagnosis not present

## 2020-08-16 DIAGNOSIS — M6281 Muscle weakness (generalized): Secondary | ICD-10-CM

## 2020-08-16 NOTE — Patient Instructions (Signed)
Access Code: [email or web address] URL: [email or web address] Date: 08/16/2020 Prepared by: Faustino Congress  Exercises Supine Quad Set - 4-5 x daily - 7 x weekly - 1-2 sets - 10 reps Supine Heel Slide with Strap - 4-5 x daily - 7 x weekly - 1-2 sets - 10 reps Seated Long Arc Quad - 4-5 x daily - 7 x weekly - 1-2 sets - 10 reps Seated Knee Flexion AAROM - 4-5 x daily - 7 x weekly - 1-2 sets - 10 reps - 10 sec hold Supine Knee Extension Mobilization with Weight - 4-5 x daily - 7 x weekly - 1 sets - 1 reps - 3-5 min hold

## 2020-08-16 NOTE — Therapy (Signed)
[name] [address] [place], [place], [address] Phone: [phone]   Fax:  [phone]  Physical Therapy Evaluation  Patient Details  Name: [name] MRN: [id number] Date of Birth: 19-Sep-1945 Referring Provider (PT): Aundra Dubin, [place]   Encounter Date: 08/16/2020   PT End of Session - 08/16/20 0926    Visit Number 1    Number of Visits 16    Date for PT Re-Evaluation 10/11/20    Authorization Type UHC Medicare $30 copay    PT Start Time 0848    PT Stop Time 0922    PT Time Calculation (min) 34 min    Activity Tolerance Patient tolerated treatment well    Behavior During Therapy [facility] for tasks assessed/performed           Past Medical History:  Diagnosis Date  . Allergy   . Anemia   . Breast CA ([place])    (Rt) breast ca dx 4/12---  S/P RADICAL MASTECTOMY AND CHEMORADIATION  . Diabetes mellitus ORAL MED  . Fatty liver disease, nonalcoholic   . History of hyperkalemia   . Hx antineoplastic chemo  10/12 - 06/24/11   PACLITAX EL COMPLETED 06/24/11  . Hyperlipidemia   . Hypertension   . Mild nonproliferative diabetic retinopathy of right eye ([place]) 03/19/11   Dr. Joseph Art  . Numbness and tingling    Hx: of in fingers and toes since chemotherapy  . OA (osteoarthritis) of knee    RIGHT LEG  . Obesity   . Renal lesion     Past Surgical History:  Procedure Laterality Date  . COLONOSCOPY W/ POLYPECTOMY     Hx; of  . I & D KNEE WITH POLY EXCHANGE Right 09/03/2012   Procedure: IRRIGATION AND DEBRIDEMENT RIGHT KNEE WITH POLY EXCHANGE;  Surgeon: Kerin Salen, MD;  Location: [place];  Service: Orthopedics;  Laterality: Right;  . JOINT REPLACEMENT    . knee arthroscopy  10-28-1999   RIGHT  . Laparoscopy with laparoscopic right salpingo oophorectomy and lysis of pelvic and abdominal adhesions  March 2013   Dr. Nori Riis   . LEFT TOTAL KNEE ARTHROPLASTY (Left Knee)  07/30/2020  . MASTECTOMY MODIFIED RADICAL  12-04-2010   W/  LEFT PAC PLACEMENT (RIGHT BREAST W/ AXILLARY CONTENTS/ NODE BX'S  . PORT-A-CATH REMOVAL  09/24/2011   Procedure: REMOVAL PORT-A-CATH;  Surgeon: Joyice Faster. [place], MD;  [facility];  Service: General;  Laterality: N/A;  . PORTACATH PLACEMENT     REPLACED PAC DUE TO MALFUNCTION (LEFT)  . TOTAL KNEE ARTHROPLASTY Right 08/16/2012   Procedure: TOTAL KNEE ARTHROPLASTY;  Surgeon: Kerin Salen, MD;  Location: [place];  Service: Orthopedics;  Laterality: Right;  . TOTAL KNEE ARTHROPLASTY Left 07/30/2020   Procedure: LEFT TOTAL KNEE ARTHROPLASTY;  Surgeon: Leandrew Koyanagi, MD;  Location: [place];  Service: Orthopedics;  Laterality: Left;  . TRANSTHORACIC ECHOCARDIOGRAM  10-29-2010   NORMAL LVF/ EF 55-60%/ MILD MV REGURG  . TUBAL LIGATION  YRS AGO  . VAGINAL HYSTERECTOMY  AGE 71   W/ LSO    There were no vitals filed for this visit.    Subjective Assessment - 08/16/20 0852    Subjective Pt is a 75 y/o female who presents to [place] s/p Lt TKA on 07/30/20.  She had HHPT and is amb with RW.  She reports Lt knee stiffness and trying to walk without the walker some.  She has a cane but isn't comfortable with a cane, will  furniture walk occasionally at home.    Pertinent History Rt breast cancer 2012, DM, HTN, diabetic retinopathy, OA, obesity, Rt TKA 2014    Limitations Standing;Walking    Patient Stated Goals improve pain, mobility of Lt knee, walk without device    Currently in Pain? Yes    Pain Score 2    up to 5/10; at best 0/10   Pain Location Knee    Pain Orientation Left    Pain Descriptors / Indicators Tightness;Aching;Dull    Pain Type Acute pain;Surgical pain    Pain Onset 1 to 4 weeks ago    Pain Frequency Constant    Aggravating Factors  end ranges, sitting/walking for too long    Pain Relieving Factors medication              [facility] PT Assessment - 08/16/20 0855      Assessment   Medical Diagnosis Z96.652 (ICD-10-CM) - Hx of total knee replacement, left    Referring Provider (PT)  Aundra Dubin, PA-C    Onset Date/Surgical Date 07/30/20    Hand Dominance Right    Next MD Visit not scheduled - around 09/11/20    Prior Therapy HHPT - 5 visits      Precautions   Precautions None      Restrictions   Weight Bearing Restrictions No      Balance Screen   Has the patient fallen in the past 6 months No    Has the patient had a decrease in activity level because of a fear of falling?  No    Is the patient reluctant to leave their home because of a fear of falling?  No      Home Environment   Living Environment Private residence    Living Arrangements Spouse/significant other    Type of Tangent to enter    Entrance Stairs-Number of Steps 2    Entrance Stairs-Rails None    Home Layout Two level;Able to live on main level with bedroom/bathroom   2 steps into main living space, no rails   [place] - single point;Walker - 2 wheels      Prior Function   Level of Independence Independent    Vocation Retired    Biomedical scientist retried from office work    Leisure none currently, Furniture conservator/restorer, some walking for exercise but unable prior to surgery, was doing water exercise at [employer] [facility])      Cognition   Overall Cognitive Status Within Functional Limits for tasks assessed      Observation/Other Assessments   Focus on Therapeutic Outcomes (FOTO)  43 (predicted 58)      ROM / Strength   AROM / PROM / Strength AROM;PROM;Strength      AROM   AROM Assessment Site Knee    Right/Left Knee Right;Left    Right Knee Extension 0    Right Knee Flexion 90    Left Knee Extension -43   seated LAQ, -11 supine   Left Knee Flexion 80      PROM   PROM Assessment Site Knee    Right/Left Knee Left    Left Knee Extension -6    Left Knee Flexion 93      Strength   Overall Strength Comments Lt knee 3-/5      Palpation   Palpation comment increased edema noted in Lt knee      Ambulation/Gait   Gait Pattern Decreased stance  time - left;Decreased step length - right;Decreased hip/knee flexion - left;Antalgic    Gait Comments amb mod I with RW                      Objective measurements completed on examination: See above findings.       [facility] Adult PT Treatment/Exercise - 08/16/20 0855      Exercises   Exercises Other Exercises    Other Exercises  see pt instructions - reviewed with pt and pt verbalized understanding                  PT Education - 08/16/20 0926    Education Details HEP    Person(s) Educated Patient    Methods Explanation;Demonstration;Handout    Comprehension Verbalized understanding;Returned demonstration            PT Short Term Goals - 08/16/20 1114      PT SHORT TERM GOAL #1   Title independent with initial HEP    Time 3    Period Weeks    Status New    Target Date 09/06/20             PT Long Term Goals - 08/16/20 1114      PT LONG TERM GOAL #1   Title independent with advanced HEP    Time 8    Period Weeks    Status New    Target Date 10/11/20      PT LONG TERM GOAL #2   Title improve Lt knee AROM 0-110 for irmproved function and mobility    Time 8    Period Weeks    Status New    Target Date 10/11/20      PT LONG TERM GOAL #3   Title amb without AD without difficulty or increase in pain for improved function    Time 8    Period Weeks    Status New    Target Date 10/11/20      PT LONG TERM GOAL #4   Title report pain < 3/10 with activity for improved mobility and function    Time 8    Period Weeks    Status New    Target Date 10/11/20      PT LONG TERM GOAL #5   Title FOTO score improved to 54 for improved function    Time 8    Period Weeks    Status New    Target Date 10/11/20                  Plan - 08/16/20 0927    Clinical Impression Statement Pt is a 75 y/o female who presents to [place] s/p Lt TKA on 07/30/20.   She demonstrates decreased strength and ROM as well as gait abnormalities and decreased  balance affecting functional mobility.  Pt will benefit from PT to address deficits listed.    Personal Factors and Comorbidities Comorbidity 3+    Comorbidities Rt breast cancer 2012, DM, HTN, diabetic retinopathy, OA, obesity, Rt TKA with complications - only able to get 90 deg flexion    Examination-Activity Limitations Bathing;Bed Mobility;Squat;Stairs;Carry;Stand;Locomotion Level;Lift    Examination-Participation Restrictions Community Activity;Shop;Meal Prep;Laundry    Stability/Clinical Decision Making Evolving/Moderate complexity    Clinical Decision Making Moderate    Rehab Potential Good    PT Frequency 2x / week    PT Duration 8 weeks    PT Treatment/Interventions ADLs/Self Care Home Management;Cryotherapy;Electrical Stimulation;Moist Heat;Balance training;Therapeutic exercise;Therapeutic activities;Functional  mobility training;Stair training;Gait training;Neuromuscular re-education;Patient/family education;Manual techniques;Vasopneumatic Device;Taping;Dry needling;Passive range of motion;Scar mobilization    PT Next Visit Plan review HEP and continued focus on ROM exercises; strengthening and gait training with [facility]    PT Home Exercise Plan Access Code: [email or web address]    Consulted and Agree with Plan of Care Patient           Patient will benefit from skilled therapeutic intervention in order to improve the following deficits and impairments:  Abnormal gait,Increased edema,Increased fascial restricitons,Pain,Decreased strength,Decreased mobility,Decreased balance,Difficulty walking,Decreased range of motion,Impaired flexibility  Visit Diagnosis: Acute pain of left knee - Plan: PT plan of care cert/re-cert  Stiffness of left knee, not elsewhere classified - Plan: PT plan of care cert/re-cert  Other abnormalities of gait and mobility - Plan: PT plan of care cert/re-cert  Localized edema - Plan: PT plan of care cert/re-cert  Muscle weakness (generalized) - Plan: PT plan of care  cert/re-cert     Problem List Patient Active Problem List   Diagnosis Date Noted  . Status post total left knee replacement 07/30/2020  . Fatigue 02/10/2019  . Primary osteoarthritis of left knee 01/25/2019  . Left carpal tunnel syndrome 01/25/2019  . Cervical neuropathy 08/20/2018  . Bilateral carpal tunnel syndrome 06/29/2018  . Depressed mood 01/21/2018  . Need for immunization against influenza 01/17/2018  . Osteopenia 12/13/2013  . Vaginal dryness 12/13/2013  . Peripheral neuropathy 11/02/2013  . Health care maintenance 11/17/2012  . Arthritis of knee, right 08/16/2012  . Breast cancer, right breast ([place]) 09/25/2010  . COLONIC POLYPS, HX OF 07/19/2007  . OBESITY NOS 07/09/2006  . Type 2 diabetes mellitus with renal manifestations ([place]) 03/10/2006  . Hyperlipidemia 03/10/2006  . Hypertension associated with diabetes ([place]) 03/10/2006  . ALLERGIC RHINITIS 03/10/2006  . Fatty liver 03/10/2006     Laureen Abrahams, PT, DPT 08/16/20 11:18 AM    [facility] Physical Therapy [address] [place], [place], [address] Phone: [phone]   Fax:  [phone]  Name: ARIYANNA OIEN MRN: [id number] Date of Birth: [birth year]

## 2020-08-21 ENCOUNTER — Other Ambulatory Visit: Payer: Self-pay

## 2020-08-21 ENCOUNTER — Ambulatory Visit: Payer: Medicare Other | Admitting: Physical Therapy

## 2020-08-21 ENCOUNTER — Encounter: Payer: Self-pay | Admitting: Physical Therapy

## 2020-08-21 DIAGNOSIS — M25662 Stiffness of left knee, not elsewhere classified: Secondary | ICD-10-CM

## 2020-08-21 DIAGNOSIS — R2689 Other abnormalities of gait and mobility: Secondary | ICD-10-CM

## 2020-08-21 DIAGNOSIS — M25562 Pain in left knee: Secondary | ICD-10-CM

## 2020-08-21 DIAGNOSIS — R600 Localized edema: Secondary | ICD-10-CM

## 2020-08-21 DIAGNOSIS — M6281 Muscle weakness (generalized): Secondary | ICD-10-CM

## 2020-08-21 NOTE — Therapy (Signed)
[facility] Physical Therapy [address] [place], [place], [address] Phone: [phone]   Fax:  [phone]  Physical Therapy Treatment  Patient Details  Name: [name] MRN: [id number] Date of Birth: 10-Jul-1945 Referring Provider (PT): Aundra Dubin, [place]   Encounter Date: 08/21/2020   PT End of Session - 08/21/20 1205    Visit Number 2    Number of Visits 16    Date for PT Re-Evaluation 10/11/20    Authorization Type UHC Medicare $30 copay    PT Start Time 1145    PT Stop Time 1233    PT Time Calculation (min) 48 min    Activity Tolerance Patient tolerated treatment well    Behavior During Therapy [facility] for tasks assessed/performed           Past Medical History:  Diagnosis Date  . Allergy   . Anemia   . Breast CA ([place])    (Rt) breast ca dx 4/12---  S/P RADICAL MASTECTOMY AND CHEMORADIATION  . Diabetes mellitus ORAL MED  . Fatty liver disease, nonalcoholic   . History of hyperkalemia   . Hx antineoplastic chemo  10/12 - 06/24/11   PACLITAX EL COMPLETED 06/24/11  . Hyperlipidemia   . Hypertension   . Mild nonproliferative diabetic retinopathy of right eye ([place]) 03/19/11   Dr. Joseph Art  . Numbness and tingling    Hx: of in fingers and toes since chemotherapy  . OA (osteoarthritis) of knee    RIGHT LEG  . Obesity   . Renal lesion     Past Surgical History:  Procedure Laterality Date  . COLONOSCOPY W/ POLYPECTOMY     Hx; of  . I & D KNEE WITH POLY EXCHANGE Right 09/03/2012   Procedure: IRRIGATION AND DEBRIDEMENT RIGHT KNEE WITH POLY EXCHANGE;  Surgeon: Kerin Salen, MD;  Location: [place];  Service: Orthopedics;  Laterality: Right;  . JOINT REPLACEMENT    . knee arthroscopy  10-28-1999   RIGHT  . Laparoscopy with laparoscopic right salpingo oophorectomy and lysis of pelvic and abdominal adhesions  March 2013   Dr. Nori Riis   . LEFT TOTAL KNEE ARTHROPLASTY (Left Knee)  07/30/2020  . MASTECTOMY MODIFIED RADICAL  12-04-2010   W/  LEFT PAC PLACEMENT (RIGHT BREAST W/ AXILLARY CONTENTS/ NODE BX'S  . PORT-A-CATH REMOVAL  09/24/2011   Procedure: REMOVAL PORT-A-CATH;  Surgeon: Joyice Faster. [place], MD;  [facility];  Service: General;  Laterality: N/A;  . PORTACATH PLACEMENT     REPLACED PAC DUE TO MALFUNCTION (LEFT)  . TOTAL KNEE ARTHROPLASTY Right 08/16/2012   Procedure: TOTAL KNEE ARTHROPLASTY;  Surgeon: Kerin Salen, MD;  Location: [place];  Service: Orthopedics;  Laterality: Right;  . TOTAL KNEE ARTHROPLASTY Left 07/30/2020   Procedure: LEFT TOTAL KNEE ARTHROPLASTY;  Surgeon: Leandrew Koyanagi, MD;  Location: [place];  Service: Orthopedics;  Laterality: Left;  . TRANSTHORACIC ECHOCARDIOGRAM  10-29-2010   NORMAL LVF/ EF 55-60%/ MILD MV REGURG  . TUBAL LIGATION  YRS AGO  . VAGINAL HYSTERECTOMY  AGE 89   W/ LSO    There were no vitals filed for this visit.   Subjective Assessment - 08/21/20 1202    Subjective Pt arriving to therapy today reporting 2/10 pain in left knee. Pt reproting they are going to come and pick up her CPM tomorrow. Pt amb today with straight cane.    Pertinent History Rt breast cancer 2012, DM, HTN, diabetic retinopathy, OA, obesity, Rt TKA 2014  Patient Stated Goals improve pain, mobility of Lt knee, walk without device    Currently in Pain? Yes    Pain Score 2     Pain Location Knee    Pain Orientation Left    Pain Descriptors / Indicators Aching;Sore    Pain Type Surgical pain;Acute pain    Pain Onset 1 to 4 weeks ago                             [facility] Adult PT Treatment/Exercise - 08/21/20 0001      Exercises   Exercises Knee/Hip      Knee/Hip Exercises: Aerobic   Recumbent Bike rocking back and forth partial revolutions x 5 minutes      Knee/Hip Exercises: Seated   Long Arc Quad Strengthening;Left;2 sets;10 reps    Long Arc Quad Weight 2 lbs.    Hamstring Curl Strengthening;Left;2 sets;10 reps    Hamstring Limitations red theraband    Sit to Sand with UE support;2  sets;10 reps      Knee/Hip Exercises: Supine   Short Arc Quad Sets Strengthening;Left;2 sets    [employer] Strengthening;Both;2 sets;10 reps    Straight Leg Raises Strengthening;Left;2 sets;10 reps      Modalities   Modalities Vasopneumatic      Vasopneumatic   Number Minutes Vasopneumatic  10 minutes    Vasopnuematic Location  Knee    Vasopneumatic Pressure Low    Vasopneumatic Temperature  34      Manual Therapy   Manual Therapy Passive ROM    Passive ROM left knee fleion and extension                    PT Short Term Goals - 08/21/20 1210      PT SHORT TERM GOAL #1   Title independent with initial HEP    Status On-going             PT Long Term Goals - 08/21/20 1211      PT LONG TERM GOAL #1   Title independent with advanced HEP    Status On-going      PT LONG TERM GOAL #2   Title improve Lt knee AROM 0-110 for irmproved function and mobility    Status On-going      PT LONG TERM GOAL #3   Title amb without AD without difficulty or increase in pain for improved function    Status On-going      PT LONG TERM GOAL #4   Title report pain < 3/10 with activity for improved mobility and function    Status On-going      PT LONG TERM GOAL #5   Title FOTO score improved to 58 for improved function    Status On-going                 Plan - 08/21/20 1206    Clinical Impression Statement Pt tolerating exercises well today. Pt reporting 2/10 pain upon arrival in her left knee. HEP reviewed.  Mild increase in pain with PROM flexion and extension overpressure. PROM 8-95 degrees. Continue skilled PT.    Personal Factors and Comorbidities Comorbidity 3+    Comorbidities Rt breast cancer 2012, DM, HTN, diabetic retinopathy, OA, obesity, Rt TKA with complications - only able to get 90 deg flexion    Examination-Activity Limitations Bathing;Bed Mobility;Squat;Stairs;Carry;Stand;Locomotion Level;Lift    Examination-Participation Restrictions Community  Activity;Shop;Meal Prep;Laundry    Stability/Clinical Decision  Making Evolving/Moderate complexity    Rehab Potential Good    PT Frequency 2x / week    PT Duration 8 weeks    PT Treatment/Interventions ADLs/Self Care Home Management;Cryotherapy;Electrical Stimulation;Moist Heat;Balance training;Therapeutic exercise;Therapeutic activities;Functional mobility training;Stair training;Gait training;Neuromuscular re-education;Patient/family education;Manual techniques;Vasopneumatic Device;Taping;Dry needling;Passive range of motion;Scar mobilization    PT Next Visit Plan continued focus on ROM exercises; strengthening and gait training with [facility]    PT Home Exercise Plan Access Code: [email or web address]    Consulted and Agree with Plan of Care Patient           Patient will benefit from skilled therapeutic intervention in order to improve the following deficits and impairments:  Abnormal gait,Increased edema,Increased fascial restricitons,Pain,Decreased strength,Decreased mobility,Decreased balance,Difficulty walking,Decreased range of motion,Impaired flexibility  Visit Diagnosis: Acute pain of left knee  Stiffness of left knee, not elsewhere classified  Other abnormalities of gait and mobility  Localized edema  Muscle weakness (generalized)     Problem List Patient Active Problem List   Diagnosis Date Noted  . Status post total left knee replacement 07/30/2020  . Fatigue 02/10/2019  . Primary osteoarthritis of left knee 01/25/2019  . Left carpal tunnel syndrome 01/25/2019  . Cervical neuropathy 08/20/2018  . Bilateral carpal tunnel syndrome 06/29/2018  . Depressed mood 01/21/2018  . Need for immunization against influenza 01/17/2018  . Osteopenia 12/13/2013  . Vaginal dryness 12/13/2013  . Peripheral neuropathy 11/02/2013  . Health care maintenance 11/17/2012  . Arthritis of knee, right 08/16/2012  . Breast cancer, right breast ([place]) 09/25/2010  . COLONIC POLYPS, HX OF 07/19/2007  .  OBESITY NOS 07/09/2006  . Type 2 diabetes mellitus with renal manifestations ([place]) 03/10/2006  . Hyperlipidemia 03/10/2006  . Hypertension associated with diabetes ([place]) 03/10/2006  . ALLERGIC RHINITIS 03/10/2006  . Fatty liver 03/10/2006    Oretha Caprice, PT, MPT 08/21/2020, 12:13 PM  [facility] Physical Therapy [address] [place], [place], [address] Phone: [phone]   Fax:  [phone]  Name: [name] MRN: [id number] Date of Birth: [birth year]

## 2020-08-22 ENCOUNTER — Encounter: Payer: Self-pay | Admitting: Rehabilitative and Restorative Service Providers"

## 2020-08-22 ENCOUNTER — Ambulatory Visit (INDEPENDENT_AMBULATORY_CARE_PROVIDER_SITE_OTHER): Payer: Medicare Other | Admitting: Rehabilitative and Restorative Service Providers"

## 2020-08-22 DIAGNOSIS — M25662 Stiffness of left knee, not elsewhere classified: Secondary | ICD-10-CM | POA: Diagnosis not present

## 2020-08-22 DIAGNOSIS — M6281 Muscle weakness (generalized): Secondary | ICD-10-CM | POA: Diagnosis not present

## 2020-08-22 DIAGNOSIS — R262 Difficulty in walking, not elsewhere classified: Secondary | ICD-10-CM

## 2020-08-22 DIAGNOSIS — R600 Localized edema: Secondary | ICD-10-CM | POA: Diagnosis not present

## 2020-08-22 DIAGNOSIS — M25562 Pain in left knee: Secondary | ICD-10-CM

## 2020-08-22 NOTE — Patient Instructions (Signed)
Access Code: [email or web address] URL: [email or web address] Date: 08/22/2020 Prepared by: Vista Mink  Exercises Supine Quad Set - 4-5 x daily - 7 x weekly - 1-2 sets - 10 reps Supine Heel Slide with Strap - 4-5 x daily - 7 x weekly - 1-2 sets - 10 reps Seated Long Arc Quad - 4-5 x daily - 7 x weekly - 1-2 sets - 10 reps Seated Knee Flexion AAROM - 4-5 x daily - 7 x weekly - 1-2 sets - 10 reps - 10 sec hold Supine Knee Extension Mobilization with Weight - 4-5 x daily - 7 x weekly - 1 sets - 1 reps - 3-5 min hold Supine Quadricep Sets - 3-5 x daily - 7 x weekly - 2-3 sets - 10 reps - 5 seconds hold

## 2020-08-22 NOTE — Therapy (Signed)
[facility] [address] [place], [place], [address] Phone: [phone]   Fax:  [phone]  Physical Therapy Treatment  Patient Details  Name: [name] MRN: [id number] Date of Birth: [birth year] Referring Provider (PT): Aundra Dubin, [place]   Encounter Date: 08/22/2020   PT End of Session - 08/22/20 1709    Visit Number 3    Number of Visits 16    Date for PT Re-Evaluation 10/11/20    Authorization Type U[employer] Medicare $30 copay    PT Start Time 1515    PT Stop Time 1603    PT Time Calculation (min) 48 min    Activity Tolerance Patient tolerated treatment well;Patient limited by pain    Behavior During Therapy [facility] for tasks assessed/performed           Past Medical History:  Diagnosis Date  . Allergy   . Anemia   . Breast CA ([place])    (Rt) breast ca dx 4/12---  S/P RADICAL MASTECTOMY AND CHEMORADIATION  . Diabetes mellitus ORAL MED  . Fatty liver disease, nonalcoholic   . History of hyperkalemia   . Hx antineoplastic chemo  10/12 - 06/24/11   PACLITAX EL COMPLETED 06/24/11  . Hyperlipidemia   . Hypertension   . Mild nonproliferative diabetic retinopathy of right eye ([place]) 03/19/11   Dr. Joseph Art  . Numbness and tingling    Hx: of in fingers and toes since chemotherapy  . OA (osteoarthritis) of knee    RIGHT LEG  . Obesity   . Renal lesion     Past Surgical History:  Procedure Laterality Date  . COLONOSCOPY W/ POLYPECTOMY     Hx; of  . I & D KNEE WITH POLY EXCHANGE Right 09/03/2012   Procedure: IRRIGATION AND DEBRIDEMENT RIGHT KNEE WITH POLY EXCHANGE;  Surgeon: Kerin Salen, MD;  Location: [place];  Service: Orthopedics;  Laterality: Right;  . JOINT REPLACEMENT    . knee arthroscopy  10-28-1999   RIGHT  . Laparoscopy with laparoscopic right salpingo oophorectomy and lysis of pelvic and abdominal adhesions  March 2013   Dr. Nori Riis   . LEFT TOTAL KNEE ARTHROPLASTY (Left Knee)  07/30/2020  . MASTECTOMY MODIFIED  RADICAL  12-04-2010   W/ LEFT PAC PLACEMENT (RIGHT BREAST W/ AXILLARY CONTENTS/ NODE BX'S  . PORT-A-CATH REMOVAL  09/24/2011   Procedure: REMOVAL PORT-A-CATH;  Surgeon: Joyice Faster. Cornett, MD;  Location: [facility];  Service: General;  Laterality: N/A;  . PORTACATH PLACEMENT     REPLACED PAC DUE TO MALFUNCTION (LEFT)  . TOTAL KNEE ARTHROPLASTY Right 08/16/2012   Procedure: TOTAL KNEE ARTHROPLASTY;  Surgeon: Kerin Salen, MD;  Location: [place];  Service: Orthopedics;  Laterality: Right;  . TOTAL KNEE ARTHROPLASTY Left 07/30/2020   Procedure: LEFT TOTAL KNEE ARTHROPLASTY;  Surgeon: Leandrew Koyanagi, MD;  Location: [place];  Service: Orthopedics;  Laterality: Left;  . TRANSTHORACIC ECHOCARDIOGRAM  10-29-2010   NORMAL LVF/ EF 55-60%/ MILD MV REGURG  . TUBAL LIGATION  YRS AGO  . VAGINAL HYSTERECTOMY  AGE 4   W/ LSO    There were no vitals filed for this visit.   Subjective Assessment - 08/22/20 1558    Subjective [name] reports "fair" HEP compliance.  She is able to sleep normally with pain medication.    Pertinent History Rt breast cancer 2012, DM, HTN, diabetic retinopathy, OA, obesity, Rt TKA 2014    Limitations Standing;Walking;Sitting    How long can you sit comfortably? Stiffness  when she gets up    How long can you stand comfortably? No more than 15    How long can you walk comfortably? 5 minutes    Patient Stated Goals Improve pain, mobility of Lt knee, walk without device    Currently in Pain? Yes    Pain Score 6     Pain Location Knee    Pain Orientation Left    Pain Descriptors / Indicators Aching;Sore;Tightness    Pain Type Chronic pain;Surgical pain    Pain Onset 1 to 4 weeks ago    Pain Frequency Constant    Aggravating Factors  Too much WB and end range pain    Pain Relieving Factors Pain medication    Effect of Pain on Daily Activities Limits WB function    Multiple Pain Sites No              [facility] PT Assessment - 08/22/20 0001      AROM   Left Knee Extension -12     Left Knee Flexion 84                         [facility] Adult PT Treatment/Exercise - 08/22/20 0001      Exercises   Exercises Knee/Hip      Knee/Hip Exercises: Aerobic   Recumbent Bike Seat 2 for 8 minutes for AAROM (not able to do a full revolution)      Knee/Hip Exercises: Seated   Hamstring Curl AAROM;Left;Limitations    Hamstring Limitations Tailgate knee flexion 2 minutes      Knee/Hip Exercises: Supine   Quad Sets Strengthening;Both;3 sets;10 reps;Limitations    Quad Sets Limitations 5 seconds    Heel Slides AAROM;Left;10 reps;Limitations    Heel Slides Limitations With belt 10 seconds      Modalities   Modalities Vasopneumatic      Vasopneumatic   Number Minutes Vasopneumatic  10 minutes    Vasopnuematic Location  Knee    Vasopneumatic Pressure Low    Vasopneumatic Temperature  34                  PT Education - 08/22/20 1602    Education Details Reviewed HEP with emphasis on quadriceps sets and knee flexion AROM activities    Person(s) Educated Patient    Methods Explanation;Demonstration;Tactile cues;Verbal cues;Handout    Comprehension Verbal cues required;Need further instruction;Returned demonstration;Verbalized understanding;Tactile cues required            PT Short Term Goals - 08/22/20 1603      PT SHORT TERM GOAL #1   Title independent with initial HEP    Status On-going             PT Long Term Goals - 08/22/20 1709      PT LONG TERM GOAL #1   Title independent with advanced HEP    Status On-going      PT LONG TERM GOAL #2   Title improve Lt knee AROM 0-110 for irmproved function and mobility    Status On-going      PT LONG TERM GOAL #3   Title amb without AD without difficulty or increase in pain for improved function    Status On-going      PT LONG TERM GOAL #4   Title report pain < 3/10 with activity for improved mobility and function    Status On-going      PT LONG TERM GOAL #5   Title  FOTO score improved  to 58 for improved function    Status On-going                 Plan - 08/22/20 1710    Clinical Impression Statement [name] reports "fair" HEP compliance.  We discussed pain being present with or without exercises and the advantages with consistent HEP compliance.  Good effort with PT today with objective gains in AROM seen.  Added quadriceps sets to her HEP for knee extension AROM and strength.  Emphasis remains AROM and strength to reduce edema and improve WB function.    Personal Factors and Comorbidities Comorbidity 3+    Comorbidities Rt breast cancer 2012, DM, HTN, diabetic retinopathy, OA, obesity, Rt TKA with complications - only able to get 90 deg flexion    Examination-Activity Limitations Bathing;Bed Mobility;Squat;Stairs;Carry;Stand;Locomotion Level;Lift    Examination-Participation Restrictions Community Activity;Shop;Meal Prep;Laundry    Stability/Clinical Decision Making Evolving/Moderate complexity    Rehab Potential Good    PT Frequency 2x / week    PT Duration 8 weeks    PT Treatment/Interventions ADLs/Self Care Home Management;Cryotherapy;Electrical Stimulation;Moist Heat;Balance training;Therapeutic exercise;Therapeutic activities;Functional mobility training;Stair training;Gait training;Neuromuscular re-education;Patient/family education;Manual techniques;Vasopneumatic Device;Taping;Dry needling;Passive range of motion;Scar mobilization    PT Next Visit Plan continued focus on ROM exercises; strengthening and gait training with [facility]    PT Home Exercise Plan Access Code: [email or web address]    Consulted and Agree with Plan of Care Patient           Patient will benefit from skilled therapeutic intervention in order to improve the following deficits and impairments:  Abnormal gait,Increased edema,Increased fascial restricitons,Pain,Decreased strength,Decreased mobility,Decreased balance,Difficulty walking,Decreased range of motion,Impaired flexibility  Visit  Diagnosis: Difficulty walking  Muscle weakness (generalized)  Localized edema  Stiffness of left knee, not elsewhere classified  Acute pain of left knee     Problem List Patient Active Problem List   Diagnosis Date Noted  . Status post total left knee replacement 07/30/2020  . Fatigue 02/10/2019  . Primary osteoarthritis of left knee 01/25/2019  . Left carpal tunnel syndrome 01/25/2019  . Cervical neuropathy 08/20/2018  . Bilateral carpal tunnel syndrome 06/29/2018  . Depressed mood 01/21/2018  . Need for immunization against influenza 01/17/2018  . Osteopenia 12/13/2013  . Vaginal dryness 12/13/2013  . Peripheral neuropathy 11/02/2013  . Health care maintenance 11/17/2012  . Arthritis of knee, right 08/16/2012  . Breast cancer, right breast ([place]) 09/25/2010  . COLONIC POLYPS, HX OF 07/19/2007  . OBESITY NOS 07/09/2006  . Type 2 diabetes mellitus with renal manifestations ([place]) 03/10/2006  . Hyperlipidemia 03/10/2006  . Hypertension associated with diabetes ([place]) 03/10/2006  . ALLERGIC RHINITIS 03/10/2006  . Fatty liver 03/10/2006    Farley Ly PT, MPT 08/22/2020, 5:13 PM  [facility] [address] [place], [place], [address] Phone: [phone]   Fax:  [phone]  Name: [name] MRN: [id number] Date of Birth: Aug 12, 1945

## 2020-09-03 ENCOUNTER — Other Ambulatory Visit: Payer: Self-pay | Admitting: Physician Assistant

## 2020-09-03 ENCOUNTER — Other Ambulatory Visit: Payer: Self-pay | Admitting: Student

## 2020-09-03 DIAGNOSIS — E1159 Type 2 diabetes mellitus with other circulatory complications: Secondary | ICD-10-CM

## 2020-09-03 DIAGNOSIS — I152 Hypertension secondary to endocrine disorders: Secondary | ICD-10-CM

## 2020-09-04 ENCOUNTER — Other Ambulatory Visit: Payer: Self-pay

## 2020-09-04 ENCOUNTER — Ambulatory Visit (INDEPENDENT_AMBULATORY_CARE_PROVIDER_SITE_OTHER): Payer: Medicare Other | Admitting: Physical Therapy

## 2020-09-04 ENCOUNTER — Encounter: Payer: Self-pay | Admitting: Physical Therapy

## 2020-09-04 DIAGNOSIS — M25662 Stiffness of left knee, not elsewhere classified: Secondary | ICD-10-CM | POA: Diagnosis not present

## 2020-09-04 DIAGNOSIS — R262 Difficulty in walking, not elsewhere classified: Secondary | ICD-10-CM | POA: Diagnosis not present

## 2020-09-04 DIAGNOSIS — M25562 Pain in left knee: Secondary | ICD-10-CM

## 2020-09-04 DIAGNOSIS — R600 Localized edema: Secondary | ICD-10-CM

## 2020-09-04 DIAGNOSIS — M6281 Muscle weakness (generalized): Secondary | ICD-10-CM | POA: Diagnosis not present

## 2020-09-04 DIAGNOSIS — R2689 Other abnormalities of gait and mobility: Secondary | ICD-10-CM

## 2020-09-04 NOTE — Therapy (Signed)
[facility] [address] [place], [place], [address] Phone: [phone]   Fax:  [phone]  Physical Therapy Treatment  Patient Details  Name: [name] MRN: [id number] Date of Birth: Oct 05, 1945 Referring Provider (PT): Aundra Dubin, [place]   Encounter Date: 09/04/2020   PT End of Session - 09/04/20 1129    Visit Number 4    Number of Visits 16    Date for PT Re-Evaluation 10/11/20    Authorization Type UHC Medicare $30 copay    PT Start Time 1050    PT Stop Time 1129    PT Time Calculation (min) 39 min    Activity Tolerance Patient tolerated treatment well;Patient limited by pain    Behavior During Therapy [facility] for tasks assessed/performed           Past Medical History:  Diagnosis Date  . Allergy   . Anemia   . Breast CA ([place])    (Rt) breast ca dx 4/12---  S/P RADICAL MASTECTOMY AND CHEMORADIATION  . Diabetes mellitus ORAL MED  . Fatty liver disease, nonalcoholic   . History of hyperkalemia   . Hx antineoplastic chemo  10/12 - 06/24/11   PACLITAX EL COMPLETED 06/24/11  . Hyperlipidemia   . Hypertension   . Mild nonproliferative diabetic retinopathy of right eye ([place]) 03/19/11   Dr. Joseph Art  . Numbness and tingling    Hx: of in fingers and toes since chemotherapy  . OA (osteoarthritis) of knee    RIGHT LEG  . Obesity   . Renal lesion     Past Surgical History:  Procedure Laterality Date  . COLONOSCOPY W/ POLYPECTOMY     Hx; of  . I & D KNEE WITH POLY EXCHANGE Right 09/03/2012   Procedure: IRRIGATION AND DEBRIDEMENT RIGHT KNEE WITH POLY EXCHANGE;  Surgeon: Kerin Salen, MD;  Location: [place];  Service: Orthopedics;  Laterality: Right;  . JOINT REPLACEMENT    . knee arthroscopy  10-28-1999   RIGHT  . Laparoscopy with laparoscopic right salpingo oophorectomy and lysis of pelvic and abdominal adhesions  March 2013   Dr. Nori Riis   . LEFT TOTAL KNEE ARTHROPLASTY (Left Knee)  07/30/2020  . MASTECTOMY MODIFIED  RADICAL  12-04-2010   W/ LEFT PAC PLACEMENT (RIGHT BREAST W/ AXILLARY CONTENTS/ NODE BX'S  . PORT-A-CATH REMOVAL  09/24/2011   Procedure: REMOVAL PORT-A-CATH;  Surgeon: Joyice Faster. Cornett, MD;  [facility];  Service: General;  Laterality: N/A;  . PORTACATH PLACEMENT     REPLACED PAC DUE TO MALFUNCTION (LEFT)  . TOTAL KNEE ARTHROPLASTY Right 08/16/2012   Procedure: TOTAL KNEE ARTHROPLASTY;  Surgeon: Kerin Salen, MD;  Location: [place];  Service: Orthopedics;  Laterality: Right;  . TOTAL KNEE ARTHROPLASTY Left 07/30/2020   Procedure: LEFT TOTAL KNEE ARTHROPLASTY;  Surgeon: Leandrew Koyanagi, MD;  Location: [place];  Service: Orthopedics;  Laterality: Left;  . TRANSTHORACIC ECHOCARDIOGRAM  10-29-2010   NORMAL LVF/ EF 55-60%/ MILD MV REGURG  . TUBAL LIGATION  YRS AGO  . VAGINAL HYSTERECTOMY  AGE 67   W/ LSO    There were no vitals filed for this visit.   Subjective Assessment - 09/04/20 1048    Subjective knee is stiff but pain is minimal    Pertinent History Rt breast cancer 2012, DM, HTN, diabetic retinopathy, OA, obesity, Rt TKA 2014    Limitations Standing;Walking;Sitting    How long can you sit comfortably? Stiffness when she gets up    How  long can you stand comfortably? No more than 15    How long can you walk comfortably? 5 minutes    Patient Stated Goals Improve pain, mobility of Lt knee, walk without device    Currently in Pain? No/denies              [facility] PT Assessment - 09/04/20 1111      Assessment   Medical Diagnosis Z96.652 (ICD-10-CM) - Hx of total knee replacement, left    Referring Provider (PT) Aundra Dubin, PA-C    Onset Date/Surgical Date 07/30/20                         [facility] Adult PT Treatment/Exercise - 09/04/20 1052      Knee/Hip Exercises: Stretches   Passive Hamstring Stretch Left;3 reps;30 seconds    Passive Hamstring Stretch Limitations seated; overpressure at distal thigh    Knee: Self-Stretch to increase Flexion Left   10 x 10  sec with RLE overpressure   Other Knee/Hip Stretches seated tailgate flexion x 3 min      Knee/Hip Exercises: Aerobic   Recumbent Bike partial revolutions at seat 1; x 8 min      Knee/Hip Exercises: Machines for Strengthening   Cybex Leg Press bil push 62# 3x10; LLE only 37# 3x10      Knee/Hip Exercises: Seated   Long Arc Quad Strengthening;Left;10 reps;3 sets    Long Arc Quad Weight 3 lbs.      Manual Therapy   Passive ROM Lt knee flexion in sitting                    PT Short Term Goals - 08/22/20 1603      PT SHORT TERM GOAL #1   Title independent with initial HEP    Status On-going             PT Long Term Goals - 08/22/20 1709      PT LONG TERM GOAL #1   Title independent with advanced HEP    Status On-going      PT LONG TERM GOAL #2   Title improve Lt knee AROM 0-110 for irmproved function and mobility    Status On-going      PT LONG TERM GOAL #3   Title amb without AD without difficulty or increase in pain for improved function    Status On-going      PT LONG TERM GOAL #4   Title report pain < 3/10 with activity for improved mobility and function    Status On-going      PT LONG TERM GOAL #5   Title FOTO score improved to 58 for improved function    Status On-going                 Plan - 09/04/20 1130    Clinical Impression Statement Pt arriving today without AD and walking with mild gait deviations and no significant instability noted.  She will see the MD next week and will continue to benefit from PT to maximize function.    Personal Factors and Comorbidities Comorbidity 3+    Comorbidities Rt breast cancer 2012, DM, HTN, diabetic retinopathy, OA, obesity, Rt TKA with complications - only able to get 90 deg flexion    Examination-Activity Limitations Bathing;Bed Mobility;Squat;Stairs;Carry;Stand;Locomotion Level;Lift    Examination-Participation Restrictions Community Activity;Shop;Meal Prep;Laundry    Stability/Clinical Decision  Making Evolving/Moderate complexity    Rehab Potential Good  PT Frequency 2x / week    PT Duration 8 weeks    PT Treatment/Interventions ADLs/Self Care Home Management;Cryotherapy;Electrical Stimulation;Moist Heat;Balance training;Therapeutic exercise;Therapeutic activities;Functional mobility training;Stair training;Gait training;Neuromuscular re-education;Patient/family education;Manual techniques;Vasopneumatic Device;Taping;Dry needling;Passive range of motion;Scar mobilization    PT Next Visit Plan check HEP, measure, needs MD note, FOTO    PT Home Exercise Plan Access Code: [email or web address]    Consulted and Agree with Plan of Care Patient           Patient will benefit from skilled therapeutic intervention in order to improve the following deficits and impairments:  Abnormal gait,Increased edema,Increased fascial restricitons,Pain,Decreased strength,Decreased mobility,Decreased balance,Difficulty walking,Decreased range of motion,Impaired flexibility  Visit Diagnosis: Difficulty walking  Muscle weakness (generalized)  Localized edema  Stiffness of left knee, not elsewhere classified  Acute pain of left knee  Other abnormalities of gait and mobility     Problem List Patient Active Problem List   Diagnosis Date Noted  . Status post total left knee replacement 07/30/2020  . Fatigue 02/10/2019  . Primary osteoarthritis of left knee 01/25/2019  . Left carpal tunnel syndrome 01/25/2019  . Cervical neuropathy 08/20/2018  . Bilateral carpal tunnel syndrome 06/29/2018  . Depressed mood 01/21/2018  . Need for immunization against influenza 01/17/2018  . Osteopenia 12/13/2013  . Vaginal dryness 12/13/2013  . Peripheral neuropathy 11/02/2013  . Health care maintenance 11/17/2012  . Arthritis of knee, right 08/16/2012  . Breast cancer, right breast ([place]) 09/25/2010  . COLONIC POLYPS, HX OF 07/19/2007  . OBESITY NOS 07/09/2006  . Type 2 diabetes mellitus with renal manifestations  ([place]) 03/10/2006  . Hyperlipidemia 03/10/2006  . Hypertension associated with diabetes ([place]) 03/10/2006  . ALLERGIC RHINITIS 03/10/2006  . Fatty liver 03/10/2006      Laureen Abrahams, PT, DPT 09/04/20 11:31 AM     [facility] [address] [place], [place], [address] Phone: [phone]   Fax:  [phone]  Name: [name] MRN: [id number] Date of Birth: Jul 07, 1945

## 2020-09-07 ENCOUNTER — Other Ambulatory Visit: Payer: Self-pay

## 2020-09-07 ENCOUNTER — Ambulatory Visit (INDEPENDENT_AMBULATORY_CARE_PROVIDER_SITE_OTHER): Payer: Medicare Other | Admitting: Rehabilitative and Restorative Service Providers"

## 2020-09-07 ENCOUNTER — Encounter: Payer: Self-pay | Admitting: Rehabilitative and Restorative Service Providers"

## 2020-09-07 DIAGNOSIS — M25662 Stiffness of left knee, not elsewhere classified: Secondary | ICD-10-CM

## 2020-09-07 DIAGNOSIS — R262 Difficulty in walking, not elsewhere classified: Secondary | ICD-10-CM

## 2020-09-07 DIAGNOSIS — M6281 Muscle weakness (generalized): Secondary | ICD-10-CM

## 2020-09-07 DIAGNOSIS — R600 Localized edema: Secondary | ICD-10-CM

## 2020-09-07 DIAGNOSIS — M25562 Pain in left knee: Secondary | ICD-10-CM

## 2020-09-07 NOTE — Patient Instructions (Signed)
Focus on 100 or more/day of quadriceps sets and knee flexion with belt 30-50/day

## 2020-09-07 NOTE — Therapy (Signed)
[facility] Physical Therapy [address] [place], [place], [address] Phone: [phone]   Fax:  [phone]  Physical Therapy Treatment  Patient Details  Name: [name] MRN: [id number] Date of Birth: Mar 24, 1946 Referring Provider (PT): Aundra Dubin, [place]   Encounter Date: 09/07/2020   PT End of Session - 09/07/20 1233    Visit Number 5    Number of Visits 16    Date for PT Re-Evaluation 10/11/20    Authorization Type UHC Medicare $30 copay    PT Start Time 1145    PT Stop Time 1235    PT Time Calculation (min) 50 min    Activity Tolerance Patient tolerated treatment well    Behavior During Therapy [facility] for tasks assessed/performed           Past Medical History:  Diagnosis Date  . Allergy   . Anemia   . Breast CA ([place])    (Rt) breast ca dx 4/12---  S/P RADICAL MASTECTOMY AND CHEMORADIATION  . Diabetes mellitus ORAL MED  . Fatty liver disease, nonalcoholic   . History of hyperkalemia   . Hx antineoplastic chemo  10/12 - 06/24/11   PACLITAX EL COMPLETED 06/24/11  . Hyperlipidemia   . Hypertension   . Mild nonproliferative diabetic retinopathy of right eye ([place]) 03/19/11   Dr. Joseph Art  . Numbness and tingling    Hx: of in fingers and toes since chemotherapy  . OA (osteoarthritis) of knee    RIGHT LEG  . Obesity   . Renal lesion     Past Surgical History:  Procedure Laterality Date  . COLONOSCOPY W/ POLYPECTOMY     Hx; of  . I & D KNEE WITH POLY EXCHANGE Right 09/03/2012   Procedure: IRRIGATION AND DEBRIDEMENT RIGHT KNEE WITH POLY EXCHANGE;  Surgeon: Kerin Salen, MD;  Location: [place];  Service: Orthopedics;  Laterality: Right;  . JOINT REPLACEMENT    . knee arthroscopy  10-28-1999   RIGHT  . Laparoscopy with laparoscopic right salpingo oophorectomy and lysis of pelvic and abdominal adhesions  March 2013   Dr. Nori Riis   . LEFT TOTAL KNEE ARTHROPLASTY (Left Knee)  07/30/2020  . MASTECTOMY MODIFIED RADICAL  12-04-2010   W/  LEFT PAC PLACEMENT (RIGHT BREAST W/ AXILLARY CONTENTS/ NODE BX'S  . PORT-A-CATH REMOVAL  09/24/2011   Procedure: REMOVAL PORT-A-CATH;  Surgeon: Joyice Faster. Cornett, MD;  [facility];  Service: General;  Laterality: N/A;  . PORTACATH PLACEMENT     REPLACED PAC DUE TO MALFUNCTION (LEFT)  . TOTAL KNEE ARTHROPLASTY Right 08/16/2012   Procedure: TOTAL KNEE ARTHROPLASTY;  Surgeon: Kerin Salen, MD;  Location: [place];  Service: Orthopedics;  Laterality: Right;  . TOTAL KNEE ARTHROPLASTY Left 07/30/2020   Procedure: LEFT TOTAL KNEE ARTHROPLASTY;  Surgeon: Leandrew Koyanagi, MD;  Location: [place];  Service: Orthopedics;  Laterality: Left;  . TRANSTHORACIC ECHOCARDIOGRAM  10-29-2010   NORMAL LVF/ EF 55-60%/ MILD MV REGURG  . TUBAL LIGATION  YRS AGO  . VAGINAL HYSTERECTOMY  AGE 60   W/ LSO    There were no vitals filed for this visit.   Subjective Assessment - 09/07/20 1226    Subjective Modelle reports inconsistent HEP compliance.  Sleeping is inconsistent.  Oxycodone is as needed.    Pertinent History Rt breast cancer 2012, DM, HTN, diabetic retinopathy, OA, obesity, Rt TKA 2014    Limitations Standing;Walking;Sitting    How long can you sit comfortably? Stiffness when she gets up  How long can you stand comfortably? No more than 15 minutes    How long can you walk comfortably? 5 minutes    Patient Stated Goals Improve pain, mobility of Lt knee, walk without device    Currently in Pain? Yes    Pain Score 2     Pain Location Knee    Pain Orientation Left    Pain Descriptors / Indicators Tightness;Aching    Pain Type Chronic pain;Surgical pain    Pain Onset More than a month ago    Pain Frequency Constant    Aggravating Factors  Too much WB, prolonged sitting and end range pain    Pain Relieving Factors Pain meds and ice    Effect of Pain on Daily Activities Limits prolonged postures    Multiple Pain Sites No              [facility] PT Assessment - 09/07/20 0001      AROM   Left Knee  Extension -12    Left Knee Flexion 94                         [facility] Adult PT Treatment/Exercise - 09/07/20 0001      Exercises   Exercises Knee/Hip      Knee/Hip Exercises: Aerobic   Recumbent Bike Seat 2 for 8 minutes for AAROM (not able to do a full revolution)      Knee/Hip Exercises: Machines for Strengthening   Cybex Leg Press Bilateral push 75# 15X 2 sets      Knee/Hip Exercises: Seated   Hamstring Curl AAROM;Left;Limitations    Hamstring Limitations Tailgate knee flexion 2 minutes      Knee/Hip Exercises: Supine   Quad Sets Strengthening;Both;3 sets;10 reps;Limitations    Quad Sets Limitations 5 seconds    Heel Slides AAROM;Left;10 reps;Limitations    Heel Slides Limitations With belt 10 seconds      Modalities   Modalities Vasopneumatic      Vasopneumatic   Number Minutes Vasopneumatic  10 minutes    Vasopnuematic Location  Knee    Vasopneumatic Pressure Medium    Vasopneumatic Temperature  34                  PT Education - 09/07/20 1230    Education Details Reviewed HEP with focus on quadriceps sets (extension AROM and strength) and knee flexion AROM.    Person(s) Educated Patient    Methods Explanation;Demonstration;Tactile cues;Verbal cues    Comprehension Returned demonstration;Need further instruction;Verbal cues required;Verbalized understanding;Tactile cues required            PT Short Term Goals - 09/07/20 1231      PT SHORT TERM GOAL #1   Title independent with initial HEP    Status Achieved             PT Long Term Goals - 09/07/20 1231      PT LONG TERM GOAL #1   Title independent with advanced HEP    Status On-going      PT LONG TERM GOAL #2   Title improve Lt knee AROM 0-110 for irmproved function and mobility    Baseline -10 to 94 on 09/07/2020    Status On-going      PT LONG TERM GOAL #3   Title amb without AD without difficulty or increase in pain for improved function    Baseline Improving but  deviations present    Status On-going  PT LONG TERM GOAL #4   Title report pain < 3/10 with activity for improved mobility and function    Baseline Can be 5+/10 at times    Time 8    Period Weeks    Status On-going      PT LONG TERM GOAL #5   Title FOTO score improved to 58 for improved function    Baseline 63 (was 43 at evaluation)    Status Achieved                 Plan - 09/07/20 1304    Clinical Impression Statement Tylah is making good objective progress towards established goals.  Extension AROM remains priority #1 as she has a 10 degree deficit from full extension.    Personal Factors and Comorbidities Comorbidity 3+    Comorbidities Rt breast cancer 2012, DM, HTN, diabetic retinopathy, OA, obesity, Rt TKA with complications - only able to get 90 deg flexion    Examination-Activity Limitations Bathing;Bed Mobility;Squat;Stairs;Carry;Stand;Locomotion Level;Lift    Examination-Participation Restrictions Community Activity;Shop;Meal Prep;Laundry    Stability/Clinical Decision Making Evolving/Moderate complexity    Rehab Potential Good    PT Frequency 2x / week    PT Duration 8 weeks    PT Treatment/Interventions ADLs/Self Care Home Management;Cryotherapy;Electrical Stimulation;Moist Heat;Balance training;Therapeutic exercise;Therapeutic activities;Functional mobility training;Stair training;Gait training;Neuromuscular re-education;Patient/family education;Manual techniques;Vasopneumatic Device;Taping;Dry needling;Passive range of motion;Scar mobilization    PT Next Visit Plan check HEP, measure, needs MD note, FOTO    PT Home Exercise Plan Access Code: [email or web address]    Consulted and Agree with Plan of Care Patient           Patient will benefit from skilled therapeutic intervention in order to improve the following deficits and impairments:  Abnormal gait,Increased edema,Increased fascial restricitons,Pain,Decreased strength,Decreased mobility,Decreased  balance,Difficulty walking,Decreased range of motion,Impaired flexibility  Visit Diagnosis: Difficulty walking  Muscle weakness (generalized)  Localized edema  Stiffness of left knee, not elsewhere classified  Acute pain of left knee     Problem List Patient Active Problem List   Diagnosis Date Noted  . Status post total left knee replacement 07/30/2020  . Fatigue 02/10/2019  . Primary osteoarthritis of left knee 01/25/2019  . Left carpal tunnel syndrome 01/25/2019  . Cervical neuropathy 08/20/2018  . Bilateral carpal tunnel syndrome 06/29/2018  . Depressed mood 01/21/2018  . Need for immunization against influenza 01/17/2018  . Osteopenia 12/13/2013  . Vaginal dryness 12/13/2013  . Peripheral neuropathy 11/02/2013  . Health care maintenance 11/17/2012  . Arthritis of knee, right 08/16/2012  . Breast cancer, right breast ([place]) 09/25/2010  . COLONIC POLYPS, HX OF 07/19/2007  . OBESITY NOS 07/09/2006  . Type 2 diabetes mellitus with renal manifestations ([place]) 03/10/2006  . Hyperlipidemia 03/10/2006  . Hypertension associated with diabetes ([place]) 03/10/2006  . ALLERGIC RHINITIS 03/10/2006  . Fatty liver 03/10/2006    Farley Ly PT, MPT 09/07/2020, 2:55 PM  [facility] Physical Therapy [address] [place], [place], [address] Phone: [phone]   Fax:  [phone]  Name: SHARISA TOVES MRN: [id number] Date of Birth: December 25, 1945

## 2020-09-08 ENCOUNTER — Other Ambulatory Visit: Payer: Self-pay | Admitting: Student

## 2020-09-08 DIAGNOSIS — E1121 Type 2 diabetes mellitus with diabetic nephropathy: Secondary | ICD-10-CM

## 2020-09-11 ENCOUNTER — Other Ambulatory Visit: Payer: Self-pay

## 2020-09-11 ENCOUNTER — Ambulatory Visit (INDEPENDENT_AMBULATORY_CARE_PROVIDER_SITE_OTHER): Payer: Medicare Other

## 2020-09-11 ENCOUNTER — Ambulatory Visit (INDEPENDENT_AMBULATORY_CARE_PROVIDER_SITE_OTHER): Payer: Medicare Other | Admitting: Orthopaedic Surgery

## 2020-09-11 ENCOUNTER — Ambulatory Visit (INDEPENDENT_AMBULATORY_CARE_PROVIDER_SITE_OTHER): Payer: Medicare Other | Admitting: Rehabilitative and Restorative Service Providers"

## 2020-09-11 ENCOUNTER — Encounter: Payer: Self-pay | Admitting: Orthopaedic Surgery

## 2020-09-11 ENCOUNTER — Encounter: Payer: Self-pay | Admitting: Rehabilitative and Restorative Service Providers"

## 2020-09-11 VITALS — Ht 59.0 in | Wt 150.0 lb

## 2020-09-11 DIAGNOSIS — M25662 Stiffness of left knee, not elsewhere classified: Secondary | ICD-10-CM

## 2020-09-11 DIAGNOSIS — R262 Difficulty in walking, not elsewhere classified: Secondary | ICD-10-CM

## 2020-09-11 DIAGNOSIS — M6281 Muscle weakness (generalized): Secondary | ICD-10-CM | POA: Diagnosis not present

## 2020-09-11 DIAGNOSIS — Z96652 Presence of left artificial knee joint: Secondary | ICD-10-CM

## 2020-09-11 DIAGNOSIS — R600 Localized edema: Secondary | ICD-10-CM | POA: Diagnosis not present

## 2020-09-11 DIAGNOSIS — M25562 Pain in left knee: Secondary | ICD-10-CM

## 2020-09-11 NOTE — Progress Notes (Signed)
Post-Op Visit Note   Patient: [name]           Date of Birth: Nov 15, 1945           MRN: [id number] Visit Date: 09/11/2020 PCP: Jillian Pope, MD   Assessment & Plan:  Chief Complaint:  Chief Complaint  Patient presents with  . Left Knee - Follow-up    Left TKA 07/30/2020   Visit Diagnoses:  1. Hx of total knee replacement, left     Plan:   [name] is 6 weeks status post left total knee replacement.  She is overall doing okay and progressing with physical therapy.  She still lacks about 10 to 15 degrees of full extension.  She takes occasional oxycodone at bedtime.  Left knee shows a fully healed surgical scar.  Range of motion 10-95.  Stable to varus valgus.  Mild swelling.  [name] is making progress although she does have a fair bit of work to do on the extension and knee flexion side.  I have heard continued compression and ice.  I have recommended weaning the oxycodone to over-the-counter medications at nighttime.  She may discontinue aspirin at this time.  Dental prophylaxis was reinforced.  Recheck in 6 weeks.  Follow-Up Instructions: Return in about 6 weeks (around 10/23/2020).   Orders:  Orders Placed This Encounter  Procedures  . XR KNEE 3 VIEW LEFT   No orders of the defined types were placed in this encounter.   Imaging: XR KNEE 3 VIEW LEFT  Result Date: 09/11/2020 Stable total knee replacement without complication   PMFS History: Patient Active Problem List   Diagnosis Date Noted  . Status post total left knee replacement 07/30/2020  . Fatigue 02/10/2019  . Primary osteoarthritis of left knee 01/25/2019  . Left carpal tunnel syndrome 01/25/2019  . Cervical neuropathy 08/20/2018  . Bilateral carpal tunnel syndrome 06/29/2018  . Depressed mood 01/21/2018  . Need for immunization against influenza 01/17/2018  . Osteopenia 12/13/2013  . Vaginal dryness 12/13/2013  . Peripheral neuropathy 11/02/2013  . Health care maintenance  11/17/2012  . Arthritis of knee, right 08/16/2012  . Breast cancer, right breast ([place]) 09/25/2010  . COLONIC POLYPS, HX OF 07/19/2007  . OBESITY NOS 07/09/2006  . Type 2 diabetes mellitus with renal manifestations ([place]) 03/10/2006  . Hyperlipidemia 03/10/2006  . Hypertension associated with diabetes ([place]) 03/10/2006  . ALLERGIC RHINITIS 03/10/2006  . Fatty liver 03/10/2006   Past Medical History:  Diagnosis Date  . Allergy   . Anemia   . Breast CA ([place])    (Rt) breast ca dx 4/12---  S/P RADICAL MASTECTOMY AND CHEMORADIATION  . Diabetes mellitus ORAL MED  . Fatty liver disease, nonalcoholic   . History of hyperkalemia   . Hx antineoplastic chemo  10/12 - 06/24/11   PACLITAX EL COMPLETED 06/24/11  . Hyperlipidemia   . Hypertension   . Mild nonproliferative diabetic retinopathy of right eye ([place]) 03/19/11   Dr. Joseph Art  . Numbness and tingling    Hx: of in fingers and toes since chemotherapy  . OA (osteoarthritis) of knee    RIGHT LEG  . Obesity   . Renal lesion     Family History  Problem Relation Age of Onset  . Stroke Father   . Heart disease Sister   . Cancer Sister        breast  . Heart disease Brother   . Cancer Sister        COLON CANCER  Past Surgical History:  Procedure Laterality Date  . COLONOSCOPY W/ POLYPECTOMY     Hx; of  . I & D KNEE WITH POLY EXCHANGE Right 09/03/2012   Procedure: IRRIGATION AND DEBRIDEMENT RIGHT KNEE WITH POLY EXCHANGE;  Surgeon: Kerin Salen, MD;  Location: [place];  Service: Orthopedics;  Laterality: Right;  . JOINT REPLACEMENT    . knee arthroscopy  10-28-1999   RIGHT  . Laparoscopy with laparoscopic right salpingo oophorectomy and lysis of pelvic and abdominal adhesions  March 2013   Dr. Nori Riis   . LEFT TOTAL KNEE ARTHROPLASTY (Left Knee)  07/30/2020  . MASTECTOMY MODIFIED RADICAL  12-04-2010   W/ LEFT PAC PLACEMENT (RIGHT BREAST W/ AXILLARY CONTENTS/ NODE BX'S  . PORT-A-CATH REMOVAL  09/24/2011   Procedure: REMOVAL  PORT-A-CATH;  Surgeon: Joyice Faster. Cornett, MD;  [facility];  Service: General;  Laterality: N/A;  . PORTACATH PLACEMENT     REPLACED PAC DUE TO MALFUNCTION (LEFT)  . TOTAL KNEE ARTHROPLASTY Right 08/16/2012   Procedure: TOTAL KNEE ARTHROPLASTY;  Surgeon: Kerin Salen, MD;  Location: [place];  Service: Orthopedics;  Laterality: Right;  . TOTAL KNEE ARTHROPLASTY Left 07/30/2020   Procedure: LEFT TOTAL KNEE ARTHROPLASTY;  Surgeon: Leandrew Koyanagi, MD;  Location: [place];  Service: Orthopedics;  Laterality: Left;  . TRANSTHORACIC ECHOCARDIOGRAM  10-29-2010   NORMAL LVF/ EF 55-60%/ MILD MV REGURG  . TUBAL LIGATION  YRS AGO  . VAGINAL HYSTERECTOMY  AGE 25   W/ LSO   Social History   Occupational History  . Not on file  Tobacco Use  . Smoking status: Never Smoker  . Smokeless tobacco: Never Used  Vaping Use  . Vaping Use: Never used  Substance and Sexual Activity  . Alcohol use: No    Alcohol/week: 0.0 standard drinks  . Drug use: No    Types: Flunitrazepam  . Sexual activity: Not on file    Comment: MENARCHE AGE 68, G4, P4, PARITY AGE 31, HRT 20+ YEARS

## 2020-09-11 NOTE — Therapy (Signed)
[facility] Physical Therapy [address] [place], [place], [address] Phone: [phone]   Fax:  [phone]  Physical Therapy Treatment  Patient Details  Name: [name] MRN: [id number] Date of Birth: Oct 23, 1945 Referring Provider (PT): Aundra Dubin, [place]   Encounter Date: 09/11/2020   PT End of Session - 09/11/20 1052    Visit Number 6    Number of Visits 16    Date for PT Re-Evaluation 10/11/20    Authorization Type UHC Medicare $30 copay    PT Start Time 1015    PT Stop Time 1104    PT Time Calculation (min) 49 min    Activity Tolerance Patient tolerated treatment well    Behavior During Therapy [facility] for tasks assessed/performed           Past Medical History:  Diagnosis Date  . Allergy   . Anemia   . Breast CA ([place])    (Rt) breast ca dx 4/12---  S/P RADICAL MASTECTOMY AND CHEMORADIATION  . Diabetes mellitus ORAL MED  . Fatty liver disease, nonalcoholic   . History of hyperkalemia   . Hx antineoplastic chemo  10/12 - 06/24/11   PACLITAX EL COMPLETED 06/24/11  . Hyperlipidemia   . Hypertension   . Mild nonproliferative diabetic retinopathy of right eye ([place]) 03/19/11   Dr. Joseph Art  . Numbness and tingling    Hx: of in fingers and toes since chemotherapy  . OA (osteoarthritis) of knee    RIGHT LEG  . Obesity   . Renal lesion     Past Surgical History:  Procedure Laterality Date  . COLONOSCOPY W/ POLYPECTOMY     Hx; of  . I & D KNEE WITH POLY EXCHANGE Right 09/03/2012   Procedure: IRRIGATION AND DEBRIDEMENT RIGHT KNEE WITH POLY EXCHANGE;  Surgeon: Kerin Salen, MD;  Location: [place];  Service: Orthopedics;  Laterality: Right;  . JOINT REPLACEMENT    . knee arthroscopy  10-28-1999   RIGHT  . Laparoscopy with laparoscopic right salpingo oophorectomy and lysis of pelvic and abdominal adhesions  March 2013   Dr. Nori Riis   . LEFT TOTAL KNEE ARTHROPLASTY (Left Knee)  07/30/2020  . MASTECTOMY MODIFIED RADICAL  12-04-2010   W/  LEFT PAC PLACEMENT (RIGHT BREAST W/ AXILLARY CONTENTS/ NODE BX'S  . PORT-A-CATH REMOVAL  09/24/2011   Procedure: REMOVAL PORT-A-CATH;  Surgeon: Joyice Faster. Cornett, MD;  [facility];  Service: General;  Laterality: N/A;  . PORTACATH PLACEMENT     REPLACED PAC DUE TO MALFUNCTION (LEFT)  . TOTAL KNEE ARTHROPLASTY Right 08/16/2012   Procedure: TOTAL KNEE ARTHROPLASTY;  Surgeon: Kerin Salen, MD;  Location: [place];  Service: Orthopedics;  Laterality: Right;  . TOTAL KNEE ARTHROPLASTY Left 07/30/2020   Procedure: LEFT TOTAL KNEE ARTHROPLASTY;  Surgeon: Leandrew Koyanagi, MD;  Location: [place];  Service: Orthopedics;  Laterality: Left;  . TRANSTHORACIC ECHOCARDIOGRAM  10-29-2010   NORMAL LVF/ EF 55-60%/ MILD MV REGURG  . TUBAL LIGATION  YRS AGO  . VAGINAL HYSTERECTOMY  AGE 26   W/ LSO    There were no vitals filed for this visit.   Subjective Assessment - 09/11/20 1021    Subjective [name] reports better HEP compliance over the weekend.  Sleeping was better with medications.  Oxycodone is still as needed.    Pertinent History Rt breast cancer 2012, DM, HTN, diabetic retinopathy, OA, obesity, Rt TKA 2014    Limitations Standing;Walking;Sitting    How long can you sit  comfortably? Stiffness when she gets up    How long can you stand comfortably? No more than 15 minutes    How long can you walk comfortably? 5 minutes    Patient Stated Goals Improve pain, mobility of Lt knee, walk without device    Currently in Pain? Yes    Pain Score 3     Pain Location Knee    Pain Orientation Left    Pain Descriptors / Indicators Tightness;Aching    Pain Type Chronic pain;Surgical pain    Pain Onset More than a month ago    Pain Frequency Constant    Aggravating Factors  Too much WB, prolonged sitting and end range pain    Pain Relieving Factors Pain meds, exercises and ice    Effect of Pain on Daily Activities Limits prolonged postures and WB function    Multiple Pain Sites No              [facility] PT  Assessment - 09/11/20 0001      Assessment   Next MD Visit 10/23/20                         [facility] Adult PT Treatment/Exercise - 09/11/20 0001      Exercises   Exercises Knee/Hip      Knee/Hip Exercises: Aerobic   Recumbent Bike Seat 2 for 8 minutes for AAROM (not able to do a full revolution)      Knee/Hip Exercises: Machines for Strengthening   Cybex Leg Press Bilateral push 100# 15X 2 sets slow eccentrics and focus on full extension at the top      Knee/Hip Exercises: Seated   Hamstring Curl AAROM;Left;Limitations    Hamstring Limitations Tailgate knee flexion 2 minutes      Knee/Hip Exercises: Supine   Quad Sets Strengthening;Both;3 sets;10 reps;Limitations    Quad Sets Limitations 5 seconds    Heel Slides AAROM;Left;10 reps;Limitations;2 sets    Heel Slides Limitations With belt 10 seconds      Modalities   Modalities Vasopneumatic      Vasopneumatic   Number Minutes Vasopneumatic  10 minutes    Vasopnuematic Location  Knee    Vasopneumatic Pressure Medium    Vasopneumatic Temperature  34                  PT Education - 09/11/20 1024    Education Details Reviewed HEP with emphasis on quadriceps sets and knee flexion AROM.    Person(s) Educated Patient    Methods Explanation;Demonstration;Tactile cues;Verbal cues    Comprehension Verbalized understanding;Tactile cues required;Need further instruction;Returned demonstration;Verbal cues required            PT Short Term Goals - 09/11/20 1024      PT SHORT TERM GOAL #1   Title independent with initial HEP    Status Achieved             PT Long Term Goals - 09/11/20 1051      PT LONG TERM GOAL #1   Title independent with advanced HEP    Status On-going      PT LONG TERM GOAL #2   Title improve Lt knee AROM 0-110 for irmproved function and mobility    Baseline -10 to 94 on 09/07/2020    Status On-going      PT LONG TERM GOAL #3   Title amb without AD without difficulty or  increase in pain for improved function  Baseline Improving but deviations present    Status On-going      PT LONG TERM GOAL #4   Title report pain < 3/10 with activity for improved mobility and function    Baseline Can be 5+/10 at times    Time 8    Period Weeks    Status On-going      PT LONG TERM GOAL #5   Title FOTO score improved to 58 for improved function    Baseline 63 (was 43 at evaluation)    Status Achieved                 Plan - 09/11/20 1052    Clinical Impression Statement [name] continues to give great effort in the clinic.  I encouraged her to continue to target 100 quadriceps sets and 50 knee flexion exercises/day with her HEP for knee extension AROM, strength and knee flexion AROM.  Continue current POC.    Personal Factors and Comorbidities Comorbidity 3+    Comorbidities Rt breast cancer 2012, DM, HTN, diabetic retinopathy, OA, obesity, Rt TKA with complications - only able to get 90 deg flexion    Examination-Activity Limitations Bathing;Bed Mobility;Squat;Stairs;Carry;Stand;Locomotion Level;Lift    Examination-Participation Restrictions Community Activity;Shop;Meal Prep;Laundry    Stability/Clinical Decision Making Evolving/Moderate complexity    Rehab Potential Good    PT Frequency 2x / week    PT Duration 8 weeks    PT Treatment/Interventions ADLs/Self Care Home Management;Cryotherapy;Electrical Stimulation;Moist Heat;Balance training;Therapeutic exercise;Therapeutic activities;Functional mobility training;Stair training;Gait training;Neuromuscular re-education;Patient/family education;Manual techniques;Vasopneumatic Device;Taping;Dry needling;Passive range of motion;Scar mobilization    PT Next Visit Plan check HEP, measure, needs MD note, FOTO    PT Home Exercise Plan Access Code: [email or web address]    Consulted and Agree with Plan of Care Patient           Patient will benefit from skilled therapeutic intervention in order to improve the following  deficits and impairments:  Abnormal gait,Increased edema,Increased fascial restricitons,Pain,Decreased strength,Decreased mobility,Decreased balance,Difficulty walking,Decreased range of motion,Impaired flexibility  Visit Diagnosis: Difficulty walking  Muscle weakness (generalized)  Localized edema  Stiffness of left knee, not elsewhere classified  Acute pain of left knee     Problem List Patient Active Problem List   Diagnosis Date Noted  . Status post total left knee replacement 07/30/2020  . Fatigue 02/10/2019  . Primary osteoarthritis of left knee 01/25/2019  . Left carpal tunnel syndrome 01/25/2019  . Cervical neuropathy 08/20/2018  . Bilateral carpal tunnel syndrome 06/29/2018  . Depressed mood 01/21/2018  . Need for immunization against influenza 01/17/2018  . Osteopenia 12/13/2013  . Vaginal dryness 12/13/2013  . Peripheral neuropathy 11/02/2013  . Health care maintenance 11/17/2012  . Arthritis of knee, right 08/16/2012  . Breast cancer, right breast ([place]) 09/25/2010  . COLONIC POLYPS, HX OF 07/19/2007  . OBESITY NOS 07/09/2006  . Type 2 diabetes mellitus with renal manifestations ([place]) 03/10/2006  . Hyperlipidemia 03/10/2006  . Hypertension associated with diabetes ([place]) 03/10/2006  . ALLERGIC RHINITIS 03/10/2006  . Fatty liver 03/10/2006    Farley Ly PT, MPT 09/11/2020, 10:57 AM  [facility] Physical Therapy [address] [place], [place], [address] Phone: [phone]   Fax:  [phone]  Name: [name] MRN: [id number] Date of Birth: 1945-08-09

## 2020-09-14 ENCOUNTER — Ambulatory Visit (INDEPENDENT_AMBULATORY_CARE_PROVIDER_SITE_OTHER): Payer: Medicare Other | Admitting: Rehabilitative and Restorative Service Providers"

## 2020-09-14 ENCOUNTER — Other Ambulatory Visit: Payer: Self-pay

## 2020-09-14 ENCOUNTER — Encounter: Payer: Self-pay | Admitting: Rehabilitative and Restorative Service Providers"

## 2020-09-14 DIAGNOSIS — M6281 Muscle weakness (generalized): Secondary | ICD-10-CM

## 2020-09-14 DIAGNOSIS — R262 Difficulty in walking, not elsewhere classified: Secondary | ICD-10-CM

## 2020-09-14 DIAGNOSIS — M25662 Stiffness of left knee, not elsewhere classified: Secondary | ICD-10-CM | POA: Diagnosis not present

## 2020-09-14 DIAGNOSIS — R600 Localized edema: Secondary | ICD-10-CM | POA: Diagnosis not present

## 2020-09-14 DIAGNOSIS — M25562 Pain in left knee: Secondary | ICD-10-CM

## 2020-09-14 NOTE — Therapy (Signed)
[facility] [address] [place], [place], [address] Phone: [phone]   Fax:  [phone]  Physical Therapy Treatment  Patient Details  Name: [name] MRN: [id number] Date of Birth: 1945/11/10 Referring Provider (PT): Aundra Dubin, [place]   Encounter Date: 09/14/2020   PT End of Session - 09/14/20 1028    Visit Number 7    Number of Visits 16    Date for PT Re-Evaluation 10/11/20    Authorization Type [employer] Medicare $30 copay    PT Start Time 1012    PT Stop Time 1107    PT Time Calculation (min) 55 min    Activity Tolerance Patient tolerated treatment well;No increased pain    Behavior During Therapy WFL for tasks assessed/performed           Past Medical History:  Diagnosis Date  . Allergy   . Anemia   . Breast CA ([place])    (Rt) breast ca dx 4/12---  S/P RADICAL MASTECTOMY AND CHEMORADIATION  . Diabetes mellitus [facility]AL [facility]  . Fatty liver disease, nonalcoholic   . History of hyperkalemia   . Hx antineoplastic chemo  10/12 - 06/24/11   PACLITAX EL COMPLETED 06/24/11  . Hyperlipidemia   . Hypertension   . Mild nonproliferative diabetic retinopathy of right eye ([place]) 03/19/11   Dr. Joseph Art  . Numbness and tingling    Hx: of in fingers and toes since chemotherapy  . OA (osteoarthritis) of knee    RIGHT LEG  . Obesity   . Renal lesion     Past Surgical History:  Procedure Laterality Date  . COLONOSCOPY W/ POLYPECTOMY     Hx; of  . I & D KNEE WITH POLY EXCHANGE Right 09/03/2012   Procedure: IRRIGATION AND DEBRIDEMENT RIGHT KNEE WITH POLY EXCHANGE;  Surgeon: Kerin Salen, MD;  Location: [place];  Service: Orthopedics;  Laterality: Right;  . JOINT REPLACEMENT    . knee arthroscopy  10-28-1999   RIGHT  . Laparoscopy with laparoscopic right salpingo oophorectomy and lysis of pelvic and abdominal adhesions  March 2013   Dr. Nori Riis   . LEFT TOTAL KNEE ARTHROPLASTY (Left Knee)  07/30/2020  . MASTECTOMY MODIFIED RADICAL   12-04-2010   W/ LEFT PAC PLACEMENT (RIGHT BREAST W/ AXILLARY CONTENTS/ NODE BX'S  . PORT-A-CATH REMOVAL  09/24/2011   Procedure: REMOVAL PORT-A-CATH;  Surgeon: Joyice Faster. Cornett, MD;  Location: [facility];  Service: General;  Laterality: N/A;  . PORTACATH PLACEMENT     REPLACED PAC DUE TO MALFUNCTION (LEFT)  . TOTAL KNEE ARTHROPLASTY Right 08/16/2012   Procedure: TOTAL KNEE ARTHROPLASTY;  Surgeon: Kerin Salen, MD;  Location: [place];  Service: Orthopedics;  Laterality: Right;  . TOTAL KNEE ARTHROPLASTY Left 07/30/2020   Procedure: LEFT TOTAL KNEE ARTHROPLASTY;  Surgeon: Leandrew Koyanagi, MD;  Location: [facility];  Service: Orthopedics;  Laterality: Left;  . TRANSTHORACIC ECHOCARDIOGRAM  10-29-2010   NORMAL LVF/ EF 55-60%/ MILD MV REGURG  . TUBAL LIGATION  YRS AGO  . VAGINAL HYSTERECTOMY  AGE 10   W/ LSO    There were no vitals filed for this visit.   Subjective Assessment - 09/14/20 1017    Subjective Jozalyn reports fair HEP compliance over the week.  Sleeping was better without medications the last few nights.    Pertinent History Rt breast cancer 2012, DM, HTN, diabetic retinopathy, OA, obesity, Rt TKA 2014    Limitations Standing;Walking;Sitting    How long can you sit  comfortably? Stiffness when she gets up    How long can you stand comfortably? No more than 15 minutes    How long can you walk comfortably? 5 minutes    Patient Stated Goals Improve pain, mobility of Lt knee, walk without device    Currently in Pain? Yes    Pain Score 3     Pain Location Knee    Pain Orientation Left    Pain Descriptors / Indicators Aching;Tightness    Pain Type Chronic pain;Surgical pain    Pain Onset More than a month ago    Pain Frequency Intermittent    Aggravating Factors  Too much WB, prolonged sitting and end range pain    Pain Relieving Factors Tylenol, exercises and ice    Effect of Pain on Daily Activities Limits prolonged postures and WB function    Multiple Pain Sites No               [facility] PT Assessment - 09/14/20 0001      AROM   Left Knee Extension -8    Left Knee Flexion 101                         [facility] Adult PT Treatment/Exercise - 09/14/20 0001      Therapeutic Activites    Therapeutic Activities ADL's    ADL's Step-up and over with emphasis on full L knee extension on step up, knee flexion on the way up and slow eccentrics on the way down 4" step      Exercises   Exercises Knee/Hip      Knee/Hip Exercises: Aerobic   Recumbent Bike Seat 2 for 8 minutes for AAROM (not able to do a full revolution)      Knee/Hip Exercises: Machines for Strengthening   Cybex Leg Press Bilateral push 100# 15X 2 sets slow eccentrics and focus on full extension at the top      Knee/Hip Exercises: Seated   Hamstring Curl AAROM;Left;Limitations    Hamstring Limitations Tailgate knee flexion 2 minutes      Knee/Hip Exercises: Supine   Quad Sets Strengthening;Both;2 sets;10 reps;Limitations    Quad Sets Limitations 5 seconds    Heel Slides AAROM;Left;1 set;10 reps;Limitations    Heel Slides Limitations With belt 10 seconds      Modalities   Modalities Vasopneumatic      Vasopneumatic   Number Minutes Vasopneumatic  10 minutes    Vasopnuematic Location  Knee    Vasopneumatic Pressure Medium    Vasopneumatic Temperature  34                  PT Education - 09/14/20 1021    Education Details Reviewed HEP with emphasis on quadriceps sets (100/Day Goal) and knee flexion AROM with strap (50/Day Goal).    Person(s) Educated Patient    Methods Explanation;Demonstration;Tactile cues;Verbal cues    Comprehension Verbal cues required;Need further instruction;Returned demonstration;Verbalized understanding;Tactile cues required            PT Short Term Goals - 09/14/20 1026      PT SHORT TERM GOAL #1   Title independent with initial HEP    Status Achieved             PT Long Term Goals - 09/14/20 1027      PT LONG TERM GOAL #1   Title  independent with advanced HEP    Status On-going      PT LONG  TERM GOAL #2   Title improve Lt knee AROM 0-110 for irmproved function and mobility    Baseline -10 to 94 on 09/07/2020    Status On-going      PT LONG TERM GOAL #3   Title amb without AD without difficulty or increase in pain for improved function    Baseline Uses with long distances, otherwise AD free    Status On-going      PT LONG TERM GOAL #4   Title report pain < 3/10 with activity for improved mobility and function    Baseline Can be 4/10 at times (was 5+/10)    Time 8    Period Weeks    Status On-going      PT LONG TERM GOAL #5   Title FOTO score improved to 58 for improved function    Baseline 63 (was 43 at evaluation)    Status Achieved                 Plan - 09/14/20 1057    Clinical Impression Statement AROM at -8 to 101 today which is improved from -12 to 95 a week ago.  Continued emphasis on AROM, quadriceps strength and edema control at home.  Added functional curb, step training today.  Continue current POC to meet all LTGs.    Personal Factors and Comorbidities Comorbidity 3+    Comorbidities Rt breast cancer 2012, DM, HTN, diabetic retinopathy, OA, obesity, Rt TKA with complications - only able to get 90 deg flexion    Examination-Activity Limitations Bathing;Bed Mobility;Squat;Stairs;Carry;Stand;Locomotion Level;Lift    Examination-Participation Restrictions Community Activity;Shop;Meal Prep;Laundry    Stability/Clinical Decision Making Evolving/Moderate complexity    Rehab Potential Good    PT Frequency 2x / week    PT Duration 8 weeks    PT Treatment/Interventions ADLs/Self Care Home Management;Cryotherapy;Electrical Stimulation;Moist Heat;Balance training;Therapeutic exercise;Therapeutic activities;Functional mobility training;Stair training;Gait training;Neuromuscular re-education;Patient/family education;Manual techniques;Vasopneumatic Device;Taping;Dry needling;Passive range of motion;Scar  mobilization    PT Next Visit Plan check HEP, measure, needs MD note, FOTO    PT Home Exercise Plan Access Code: [email or web address]    Consulted and Agree with Plan of Care Patient           Patient will benefit from skilled therapeutic intervention in order to improve the following deficits and impairments:  Abnormal gait,Increased edema,Increased fascial restricitons,Pain,Decreased strength,Decreased mobility,Decreased balance,Difficulty walking,Decreased range of motion,Impaired flexibility  Visit Diagnosis: Difficulty walking  Muscle weakness (generalized)  Localized edema  Stiffness of left knee, not elsewhere classified  Acute pain of left knee     Problem List Patient Active Problem List   Diagnosis Date Noted  . Status post total left knee replacement 07/30/2020  . Fatigue 02/10/2019  . Primary osteoarthritis of left knee 01/25/2019  . Left carpal tunnel syndrome 01/25/2019  . Cervical neuropathy 08/20/2018  . Bilateral carpal tunnel syndrome 06/29/2018  . Depressed mood 01/21/2018  . Need for immunization against influenza 01/17/2018  . Osteopenia 12/13/2013  . Vaginal dryness 12/13/2013  . Peripheral neuropathy 11/02/2013  . Health care maintenance 11/17/2012  . Arthritis of knee, right 08/16/2012  . Breast cancer, right breast ([place]) 09/25/2010  . COLONIC POLYPS, HX OF 07/19/2007  . OBESITY NOS 07/09/2006  . Type 2 diabetes mellitus with renal manifestations ([place]) 03/10/2006  . Hyperlipidemia 03/10/2006  . Hypertension associated with diabetes ([place]) 03/10/2006  . ALLERGIC RHINITIS 03/10/2006  . Fatty liver 03/10/2006    Farley Ly PT, MPT 09/14/2020, 10:59 AM  [facility] [phone]  [place], [place], [address] Phone: [phone]   Fax:  [phone]  Name: JOURNIE KROTZER MRN: [id number] Date of Birth: [birth year]

## 2020-09-18 ENCOUNTER — Ambulatory Visit (INDEPENDENT_AMBULATORY_CARE_PROVIDER_SITE_OTHER): Payer: Medicare Other | Admitting: Physical Therapy

## 2020-09-18 ENCOUNTER — Encounter: Payer: Self-pay | Admitting: Physical Therapy

## 2020-09-18 ENCOUNTER — Other Ambulatory Visit: Payer: Self-pay

## 2020-09-18 DIAGNOSIS — M25662 Stiffness of left knee, not elsewhere classified: Secondary | ICD-10-CM

## 2020-09-18 DIAGNOSIS — R262 Difficulty in walking, not elsewhere classified: Secondary | ICD-10-CM

## 2020-09-18 DIAGNOSIS — M6281 Muscle weakness (generalized): Secondary | ICD-10-CM | POA: Diagnosis not present

## 2020-09-18 DIAGNOSIS — R600 Localized edema: Secondary | ICD-10-CM

## 2020-09-18 DIAGNOSIS — R2689 Other abnormalities of gait and mobility: Secondary | ICD-10-CM

## 2020-09-18 DIAGNOSIS — M25562 Pain in left knee: Secondary | ICD-10-CM

## 2020-09-18 NOTE — Therapy (Signed)
[facility] [address] [place], [place], [address] Phone: [phone]   Fax:  [phone]  Physical Therapy Treatment  Patient Details  Name: [name] MRN: [id number] Date of Birth: 1945/12/16 Referring Provider (PT): Aundra Dubin, [place]   Encounter Date: 09/18/2020   PT End of Session - 09/18/20 1049    Visit Number 8    Number of Visits 16    Date for PT Re-Evaluation 10/11/20    Authorization Type UHC Medicare $30 copay    PT Start Time 1005    PT Stop Time 1045    PT Time Calculation (min) 40 min    Activity Tolerance Patient tolerated treatment well;No increased pain    Behavior During Therapy WFL for tasks assessed/performed           Past Medical History:  Diagnosis Date  . Allergy   . Anemia   . Breast CA ([place])    (Rt) breast ca dx 4/12---  S/P RADICAL MASTECTOMY AND CHEMORADIATION  . Diabetes mellitus ORAL MED  . Fatty liver disease, nonalcoholic   . History of hyperkalemia   . Hx antineoplastic chemo  10/12 - 06/24/11   PACLITAX EL COMPLETED 06/24/11  . Hyperlipidemia   . Hypertension   . Mild nonproliferative diabetic retinopathy of right eye ([place]) 03/19/11   Dr. Joseph Art  . Numbness and tingling    Hx: of in fingers and toes since chemotherapy  . OA (osteoarthritis) of knee    RIGHT LEG  . Obesity   . Renal lesion     Past Surgical History:  Procedure Laterality Date  . COLONOSCOPY W/ POLYPECTOMY     Hx; of  . I & D KNEE WITH POLY EXCHANGE Right 09/03/2012   Procedure: IRRIGATION AND DEBRIDEMENT RIGHT KNEE WITH POLY EXCHANGE;  Surgeon: Kerin Salen, MD;  Location: [facility];  Service: Orthopedics;  Laterality: Right;  . JOINT REPLACEMENT    . knee arthroscopy  10-28-1999   RIGHT  . Laparoscopy with laparoscopic right salpingo oophorectomy and lysis of pelvic and abdominal adhesions  March 2013   Dr. Nori Riis   . LEFT TOTAL KNEE ARTHROPLASTY (Left Knee)  07/30/2020  . MASTECTOMY MODIFIED RADICAL   12-04-2010   W/ LEFT PAC PLACEMENT (RIGHT BREAST W/ AXILLARY CONTENTS/ NODE BX'S  . PORT-A-CATH REMOVAL  09/24/2011   Procedure: REMOVAL PORT-A-CATH;  Surgeon: Joyice Faster. Cornett, MD;  Location: [facility];  Service: General;  Laterality: N/A;  . PORTACATH PLACEMENT     REPLACED PAC DUE TO MALFUNCTION (LEFT)  . TOTAL KNEE ARTHROPLASTY Right 08/16/2012   Procedure: TOTAL KNEE ARTHROPLASTY;  Surgeon: Kerin Salen, MD;  Location: [facility];  Service: Orthopedics;  Laterality: Right;  . TOTAL KNEE ARTHROPLASTY Left 07/30/2020   Procedure: LEFT TOTAL KNEE ARTHROPLASTY;  Surgeon: Leandrew Koyanagi, MD;  Location: [facility];  Service: Orthopedics;  Laterality: Left;  . TRANSTHORACIC ECHOCARDIOGRAM  10-29-2010   NORMAL LVF/ EF 55-60%/ MILD MV REGURG  . TUBAL LIGATION  YRS AGO  . VAGINAL HYSTERECTOMY  AGE 32   W/ LSO    There were no vitals filed for this visit.   Subjective Assessment - 09/18/20 1007    Subjective doing well, knee is stiff; [facility] [place] [facility]    Pertinent History Rt breast cancer 2012, DM, HTN, diabetic retinopathy, OA, obesity, Rt TKA 2014    Limitations Standing;Walking;Sitting    How long can you sit comfortably? Stiffness when  she gets up    How long can you stand comfortably? No more than 15 minutes    How long can you walk comfortably? 5 minutes    Patient Stated Goals Improve pain, mobility of Lt knee, walk without device    Currently in Pain? Yes    Pain Score 2     Pain Location Knee    Pain Orientation Left    Pain Descriptors / Indicators Aching;Tightness    Pain Type Chronic pain;Surgical pain    Pain Onset More than a month ago    Pain Frequency Intermittent    Aggravating Factors  weight bearing, prolonged witting and end range pain    Pain Relieving Factors tylenol, exercises, ice                             [facility] Adult PT Treatment/Exercise - 09/18/20 1007      Knee/Hip Exercises: Stretches   Passive Hamstring Stretch Left;3  reps;30 seconds    Passive Hamstring Stretch Limitations seated; overpressure at distal thigh    Knee: Self-Stretch to increase Flexion Left   10 x 10 sec with RLE overpressure     Knee/Hip Exercises: Aerobic   Recumbent Bike Seat 2 for 8 minutes for AAROM (not able to do a full revolution)      Knee/Hip Exercises: Machines for Strengthening   Cybex Leg Press bil 100# 3x10; then LLE only 50# 3x10      Knee/Hip Exercises: Seated   Long Arc Quad Strengthening;Left;10 reps;3 sets    [employer] Weight 3 lbs.    Hamstring Limitations Tailgate knee flexion 2 minutes    Sit to Sand 10 reps;without UE support      Knee/Hip Exercises: Supine   Straight Leg Raises Strengthening;Left;2 sets;10 reps    Straight Leg Raises Limitations cues to decrease extensor lag      Manual Therapy   Passive ROM Lt knee flexion in sitting with RLE LAQ, supine Lt knee extension to tolerance                    PT Short Term Goals - 09/14/20 1026      PT SHORT TERM GOAL #1   Title independent with initial HEP    Status Achieved             PT Long Term Goals - 09/14/20 1027      PT LONG TERM GOAL #1   Title independent with advanced HEP    Status On-going      PT LONG TERM GOAL #2   Title improve Lt knee AROM 0-110 for irmproved function and mobility    Baseline -10 to 94 on 09/07/2020    Status On-going      PT LONG TERM GOAL #3   Title amb without AD without difficulty or increase in pain for improved function    Baseline Uses with long distances, otherwise AD free    Status On-going      PT LONG TERM GOAL #4   Title report pain < 3/10 with activity for improved mobility and function    Baseline Can be 4/10 at times (was 5+/10)    Time 8    Period Weeks    Status On-going      PT LONG TERM GOAL #5   Title FOTO score improved to 58 for improved function    Baseline 63 (was 43 at evaluation)  Status Achieved                 Plan - 09/18/20 1050    Clinical  Impression Statement Pt tolerated session well today with focus on ROM and strengthening with pt.  Emphasized the importance of passive extension work at home.  Will continue to benefit from PT to maximize function.    Personal Factors and Comorbidities Comorbidity 3+    Comorbidities Rt breast cancer 2012, DM, HTN, diabetic retinopathy, OA, obesity, Rt TKA with complications - only able to get 90 deg flexion    Examination-Activity Limitations Bathing;Bed Mobility;Squat;Stairs;Carry;Stand;Locomotion Level;Lift    Examination-Participation Restrictions Community Activity;Shop;Meal Prep;Laundry    Stability/Clinical Decision Making Evolving/Moderate complexity    Rehab Potential Good    PT Frequency 2x / week    PT Duration 8 weeks    PT Treatment/Interventions ADLs/Self Care Home Management;Cryotherapy;Electrical Stimulation;Moist Heat;Balance training;Therapeutic exercise;Therapeutic activities;Functional mobility training;Stair training;Gait training;Neuromuscular re-education;Patient/family education;Manual techniques;Vasopneumatic Device;Taping;Dry needling;Passive range of motion;Scar mobilization    PT Next Visit Plan continue with strengthening, ROM, standing balance    PT Home Exercise Plan Access Code: [email or web address]    Consulted and Agree with Plan of Care Patient           Patient will benefit from skilled therapeutic intervention in order to improve the following deficits and impairments:  Abnormal gait,Increased edema,Increased fascial restricitons,Pain,Decreased strength,Decreased mobility,Decreased balance,Difficulty walking,Decreased range of motion,Impaired flexibility  Visit Diagnosis: Difficulty walking  Muscle weakness (generalized)  Localized edema  Stiffness of left knee, not elsewhere classified  Acute pain of left knee  Other abnormalities of gait and mobility     Problem List Patient Active Problem List   Diagnosis Date Noted  . Status post total left knee  replacement 07/30/2020  . Fatigue 02/10/2019  . Primary osteoarthritis of left knee 01/25/2019  . Left carpal tunnel syndrome 01/25/2019  . Cervical neuropathy 08/20/2018  . Bilateral carpal tunnel syndrome 06/29/2018  . Depressed mood 01/21/2018  . Need for immunization against influenza 01/17/2018  . Osteopenia 12/13/2013  . Vaginal dryness 12/13/2013  . Peripheral neuropathy 11/02/2013  . Health care maintenance 11/17/2012  . Arthritis of knee, right 08/16/2012  . Breast cancer, right breast ([place]) 09/25/2010  . COLONIC POLYPS, HX OF 07/19/2007  . OBESITY NOS 07/09/2006  . Type 2 diabetes mellitus with renal manifestations ([place]) 03/10/2006  . Hyperlipidemia 03/10/2006  . Hypertension associated with diabetes ([place]) 03/10/2006  . ALLERGIC RHINITIS 03/10/2006  . Fatty liver 03/10/2006     Laureen Abrahams, PT, DPT 09/18/20 10:51 AM    [facility] [address] [place], [place], [address] Phone: [phone]   Fax:  [phone]  Name: [name] MRN: [id number] Date of Birth: 1945-06-13

## 2020-09-20 ENCOUNTER — Ambulatory Visit (INDEPENDENT_AMBULATORY_CARE_PROVIDER_SITE_OTHER): Payer: Medicare Other | Admitting: Rehabilitative and Restorative Service Providers"

## 2020-09-20 ENCOUNTER — Other Ambulatory Visit: Payer: Self-pay

## 2020-09-20 ENCOUNTER — Encounter: Payer: Self-pay | Admitting: Rehabilitative and Restorative Service Providers"

## 2020-09-20 DIAGNOSIS — R600 Localized edema: Secondary | ICD-10-CM

## 2020-09-20 DIAGNOSIS — M25662 Stiffness of left knee, not elsewhere classified: Secondary | ICD-10-CM | POA: Diagnosis not present

## 2020-09-20 DIAGNOSIS — M25562 Pain in left knee: Secondary | ICD-10-CM

## 2020-09-20 DIAGNOSIS — R262 Difficulty in walking, not elsewhere classified: Secondary | ICD-10-CM | POA: Diagnosis not present

## 2020-09-20 DIAGNOSIS — M6281 Muscle weakness (generalized): Secondary | ICD-10-CM

## 2020-09-20 NOTE — Therapy (Signed)
[facility] [address] [place], [place], [address] Phone: [phone]   Fax:  [phone]  Physical Therapy Treatment  Patient Details  Name: [name] MRN: [id number] Date of Birth: Jul 16, 1945 Referring Provider (PT): [name], [place]   Encounter Date: 09/20/2020   PT End of Session - 09/20/20 1038    Visit Number 9    Number of Visits 16    Date for PT Re-Evaluation 10/11/20    Authorization Type [employer] Medicare $30 copay    PT Start Time 1015    PT Stop Time 1055    PT Time Calculation (min) 40 min    Activity Tolerance Patient tolerated treatment well;No increased pain    Behavior During Therapy WFL for tasks assessed/performed           Past Medical History:  Diagnosis Date  . Allergy   . Anemia   . Breast CA ([place])    (Rt) breast ca dx 4/12---  S/P RADICAL MASTECTOMY AND CHEMORADIATION  . Diabetes mellitus ORAL MED  . Fatty liver disease, nonalcoholic   . History of hyperkalemia   . Hx antineoplastic chemo  10/12 - 06/24/11   PACLITAX EL COMPLETED 06/24/11  . Hyperlipidemia   . Hypertension   . Mild nonproliferative diabetic retinopathy of right eye ([place]) 03/19/11   Dr. Joseph Ryan  . Numbness and tingling    Hx: of in fingers and toes since chemotherapy  . OA (osteoarthritis) of knee    RIGHT LEG  . Obesity   . Renal lesion     Past Surgical History:  Procedure Laterality Date  . COLONOSCOPY W/ POLYPECTOMY     Hx; of  . I & D KNEE WITH POLY EXCHANGE Right 09/03/2012   Procedure: IRRIGATION AND DEBRIDEMENT RIGHT KNEE WITH POLY EXCHANGE;  Surgeon: Jillian Salen, MD;  Location: [facility];  Service: Orthopedics;  Laterality: Right;  . JOINT REPLACEMENT    . knee arthroscopy  10-28-1999   RIGHT  . Laparoscopy with laparoscopic right salpingo oophorectomy and lysis of pelvic and abdominal adhesions  March 2013   Dr. Nori Ryan   . LEFT TOTAL KNEE ARTHROPLASTY (Left Knee)  07/30/2020  . MASTECTOMY MODIFIED RADICAL   12-04-2010   W/ LEFT PAC PLACEMENT (RIGHT BREAST W/ AXILLARY CONTENTS/ NODE BX'S  . PORT-A-CATH REMOVAL  09/24/2011   Procedure: REMOVAL PORT-A-CATH;  Surgeon: Jillian Faster. Cornett, MD;  Location: [facility];  Service: General;  Laterality: N/A;  . PORTACATH PLACEMENT     REPLACED PAC DUE TO MALFUNCTION (LEFT)  . TOTAL KNEE ARTHROPLASTY Right 08/16/2012   Procedure: TOTAL KNEE ARTHROPLASTY;  Surgeon: Jillian Salen, MD;  Location: [facility];  Service: Orthopedics;  Laterality: Right;  . TOTAL KNEE ARTHROPLASTY Left 07/30/2020   Procedure: LEFT TOTAL KNEE ARTHROPLASTY;  Surgeon: Jillian Koyanagi, MD;  Location: [facility];  Service: Orthopedics;  Laterality: Left;  . TRANSTHORACIC ECHOCARDIOGRAM  10-29-2010   NORMAL LVF/ EF 55-60%/ MILD MV REGURG  . TUBAL LIGATION  YRS AGO  . VAGINAL HYSTERECTOMY  AGE 33   W/ LSO    There were no vitals filed for this visit.   Subjective Assessment - 09/20/20 1017    Subjective [name] reports "some" HEP compliance along with 1 trip to the "[facility] pool.    Pertinent History Rt breast cancer 2012, DM, HTN, diabetic retinopathy, OA, obesity, Rt TKA 2014    Limitations Standing;Walking;Sitting    How long can you sit comfortably? Stiffness when she gets  up    How long can you stand comfortably? No more than 15 minutes    How long can you walk comfortably? 5 minutes    Patient Stated Goals Improve pain, mobility of Lt knee, walk without device    Currently in Pain? Yes    Pain Score 1     Pain Location Knee    Pain Orientation Left    Pain Descriptors / Indicators Aching;Tightness    Pain Type Chronic pain;Surgical pain    Pain Onset More than a month ago    Pain Frequency Intermittent    Aggravating Factors  Start-up stiffness, prolonged WB and late day fatigue    Pain Relieving Factors Tylenol (rarely), exercises and ice    Effect of Pain on Daily Activities Limits prolonged postures and WB function    Multiple Pain Sites No              [facility] PT Assessment -  09/20/20 0001      AROM   Left Knee Extension -7    Left Knee Flexion 101                         [facility] Adult PT Treatment/Exercise - 09/20/20 0001      Exercises   Exercises Knee/Hip      Knee/Hip Exercises: Aerobic   Recumbent Bike Seat 2 for 8 minutes for AAROM (not able to do a full revolution)      Knee/Hip Exercises: Machines for Strengthening   Cybex Leg Press Bilateral push 100# 15X 2 sets slow eccentrics and focus on full extension at the top.  Also 50# single leg (L only) 2 sets of 15 slow eccentrics with self-overpressure into extension.      Knee/Hip Exercises: Supine   Quad Sets Strengthening;Both;3 sets;10 reps;Limitations    Quad Sets Limitations 5 seconds    Heel Slides AAROM;Left;2 sets;10 reps;Limitations    Heel Slides Limitations With belt 10 seconds                  PT Education - 09/20/20 1020    Education Details Reviewed HEP with emphasis on quadriceps sets (100/Day Goal) and knee flexion AROM with strap (50/Day Goal).    Person(s) Educated Patient    Methods Explanation;Demonstration;Tactile cues;Verbal cues    Comprehension Verbal cues required;Tactile cues required;Returned demonstration;Verbalized understanding;Need further instruction            PT Short Term Goals - 09/20/20 1021      PT SHORT TERM GOAL #1   Title independent with initial HEP    Status Achieved             PT Long Term Goals - 09/20/20 1021      PT LONG TERM GOAL #1   Title independent with advanced HEP    Time 8    Period Weeks    Status On-going      PT LONG TERM GOAL #2   Title improve Lt knee AROM 0-110 for irmproved function and mobility    Baseline -7 to 101 on 09/20/2020    Time 8    Status On-going      PT LONG TERM GOAL #3   Title amb without AD without difficulty or increase in pain for improved function    Baseline Uses with long distances, otherwise AD free    Time 8    Period Weeks    Status On-going  PT LONG TERM  GOAL #4   Title report pain < 3/10 with activity for improved mobility and function    Baseline Can be 4/10 at times (was 5+/10)    Time 8    Period Weeks    Status Achieved      PT LONG TERM GOAL #5   Title FOTO score improved to 58 for improved function    Baseline 63 (was 43 at evaluation)    Time 8    Period Weeks    Status Achieved                 Plan - 09/20/20 1100    Clinical Impression Statement Brunella continues her excellent effort with her supervised PT visits.  She gets some but not all of her HEP exercises in.  I encouraged her to work as hard at home as she does in the office to return full knee extension AROM and strength and improve flexion AROM to 110 degrees or better.    Personal Factors and Comorbidities Comorbidity 3+    Comorbidities Rt breast cancer 2012, DM, HTN, diabetic retinopathy, OA, obesity, Rt TKA with complications - only able to get 90 deg flexion    Examination-Activity Limitations Bathing;Bed Mobility;Squat;Stairs;Carry;Stand;Locomotion Level;Lift    Examination-Participation Restrictions Community Activity;Shop;Meal Prep;Laundry    Stability/Clinical Decision Making Evolving/Moderate complexity    Rehab Potential Good    PT Frequency 2x / week    PT Duration 8 weeks    PT Treatment/Interventions ADLs/Self Care Home Management;Cryotherapy;Electrical Stimulation;Moist Heat;Balance training;Therapeutic exercise;Therapeutic activities;Functional mobility training;Stair training;Gait training;Neuromuscular re-education;Patient/family education;Manual techniques;Vasopneumatic Device;Taping;Dry needling;Passive range of motion;Scar mobilization    PT Next Visit Plan continue with strengthening, ROM, standing balance    PT Home Exercise Plan Access Code: [email or web address]    Consulted and Agree with Plan of Care Patient           Patient will benefit from skilled therapeutic intervention in order to improve the following deficits and impairments:   Abnormal gait,Increased edema,Increased fascial restricitons,Pain,Decreased strength,Decreased mobility,Decreased balance,Difficulty walking,Decreased range of motion,Impaired flexibility  Visit Diagnosis: Difficulty walking  Muscle weakness (generalized)  Localized edema  Stiffness of left knee, not elsewhere classified  Acute pain of left knee     Problem List Patient Active Problem List   Diagnosis Date Noted  . Status post total left knee replacement 07/30/2020  . Fatigue 02/10/2019  . Primary osteoarthritis of left knee 01/25/2019  . Left carpal tunnel syndrome 01/25/2019  . Cervical neuropathy 08/20/2018  . Bilateral carpal tunnel syndrome 06/29/2018  . Depressed mood 01/21/2018  . Need for immunization against influenza 01/17/2018  . Osteopenia 12/13/2013  . Vaginal dryness 12/13/2013  . Peripheral neuropathy 11/02/2013  . Health care maintenance 11/17/2012  . Arthritis of knee, right 08/16/2012  . Breast cancer, right breast ([place]) 09/25/2010  . COLONIC POLYPS, HX OF 07/19/2007  . OBESITY NOS 07/09/2006  . Type 2 diabetes mellitus with renal manifestations ([place]) 03/10/2006  . Hyperlipidemia 03/10/2006  . Hypertension associated with diabetes ([place]) 03/10/2006  . ALLERGIC RHINITIS 03/10/2006  . Fatty liver 03/10/2006    Farley Ly PT, MPT 09/20/2020, 11:01 AM  [facility] [address] [place], [place], [address] Phone: [phone]   Fax:  [phone]  Name: [name] MRN: [id number] Date of Birth: 08-11-1945

## 2020-09-24 ENCOUNTER — Other Ambulatory Visit: Payer: Self-pay

## 2020-09-24 ENCOUNTER — Ambulatory Visit (INDEPENDENT_AMBULATORY_CARE_PROVIDER_SITE_OTHER): Payer: Medicare Other | Admitting: Student

## 2020-09-24 ENCOUNTER — Encounter: Payer: Self-pay | Admitting: Student

## 2020-09-24 VITALS — BP 128/101 | HR 100 | Temp 98.2°F | Ht 59.0 in | Wt 143.0 lb

## 2020-09-24 DIAGNOSIS — I152 Hypertension secondary to endocrine disorders: Secondary | ICD-10-CM | POA: Diagnosis not present

## 2020-09-24 DIAGNOSIS — R110 Nausea: Secondary | ICD-10-CM | POA: Diagnosis not present

## 2020-09-24 DIAGNOSIS — E1159 Type 2 diabetes mellitus with other circulatory complications: Secondary | ICD-10-CM | POA: Diagnosis not present

## 2020-09-24 DIAGNOSIS — E1121 Type 2 diabetes mellitus with diabetic nephropathy: Secondary | ICD-10-CM

## 2020-09-24 LAB — GLUCOSE, CAPILLARY: Glucose-Capillary: 246 mg/dL — ABNORMAL HIGH (ref 70–99)

## 2020-09-24 LAB — POCT GLYCOSYLATED HEMOGLOBIN (HGB A1C): Hemoglobin A1C: 7.4 % — AB (ref 4.0–5.6)

## 2020-09-24 MED ORDER — DAPAGLIFLOZIN PROPANEDIOL 5 MG PO TABS: 5.0000 mg | ORAL_TABLET | Freq: Every day | ORAL | 5 refills | Status: DC

## 2020-09-24 MED ORDER — ONDANSETRON 4 MG PO TBDP: 4.0000 mg | ORAL_TABLET | Freq: Three times a day (TID) | ORAL | 0 refills | Status: DC | PRN

## 2020-09-24 MED ORDER — ONDANSETRON HCL 4 MG PO TABS: 4.0000 mg | ORAL_TABLET | Freq: Three times a day (TID) | ORAL | 0 refills | Status: DC | PRN

## 2020-09-24 NOTE — Progress Notes (Signed)
CC: Hyperglycemia, hypertension  HPI:  [name] is a 75 y.o. female with a past medical history stated below and presents today for management of chronic diseases of hypertension, diabetes, postchemotherapy nausea. Please see problem based assessment and plan for additional details.  Past Medical History:  Diagnosis Date  . Allergy   . Anemia   . Breast CA ([place])    (Rt) breast ca dx 4/12---  S/P RADICAL MASTECTOMY AND CHEMORADIATION  . Diabetes mellitus ORAL MED  . Fatty liver disease, nonalcoholic   . History of hyperkalemia   . Hx antineoplastic chemo  10/12 - 06/24/11   PACLITAX EL COMPLETED 06/24/11  . Hyperlipidemia   . Hypertension   . Mild nonproliferative diabetic retinopathy of right eye ([place]) 03/19/11   Dr. Joseph Art  . Numbness and tingling    Hx: of in fingers and toes since chemotherapy  . OA (osteoarthritis) of knee    RIGHT LEG  . Obesity   . Renal lesion     Current Outpatient Medications on File Prior to Visit  Medication Sig Dispense Refill  . Accu-Chek Softclix Lancets lancets Check blood sugar up to three times daily. 300 each 3  . aspirin EC 81 MG tablet Take 1 tablet (81 mg total) by mouth 2 (two) times daily. To be taken after surgery 84 tablet 0  . atorvastatin (LIPITOR) 40 MG tablet TAKE 1 TABLET BY MOUTH EVERYDAY AT BEDTIME (Patient taking differently: Take 40 mg by mouth at bedtime.) 90 tablet 1  . cephALEXin (KEFLEX) 500 MG capsule Take 1 capsule (500 mg total) by mouth 4 (four) times daily. 40 capsule 0  . chlorthalidone (HYGROTON) 25 MG tablet TAKE 1 TABLET BY MOUTH EVERY DAY 90 tablet 0  . docusate sodium (COLACE) 100 MG capsule Take 1 capsule (100 mg total) by mouth daily as needed. 30 capsule 2  . fluticasone (FLONASE) 50 MCG/ACT nasal spray Place 2 sprays into both nostrils daily. (Patient taking differently: Place 2 sprays into both nostrils daily as needed for allergies or rhinitis (allergies.).) 16 g 0  . glucose blood  (ACCU-CHEK GUIDE) test strip Check blood sugar up to three times daily. 300 each 3  . lisinopril (ZESTRIL) 20 MG tablet TAKE 1 TABLET BY MOUTH EVERY DAY (Patient taking differently: Take 20 mg by mouth daily.) 90 tablet 1  . methocarbamol (ROBAXIN) 500 MG tablet Take 1 tablet (500 mg total) by mouth 2 (two) times daily as needed. To be taken after surgery 20 tablet 0  . oxyCODONE-acetaminophen (PERCOCET) 5-325 MG tablet Take 1-2 tablets by mouth every 6 (six) hours as needed. To be taken after surgery 40 tablet 0  . Polyethyl Glycol-Propyl Glycol (SYSTANE) 0.4-0.3 % SOLN Place 1 drop into both eyes daily as needed (Dry eyes).     No current facility-administered medications on file prior to visit.    Family History  Problem Relation Age of Onset  . Stroke Father   . Heart disease Sister   . Cancer Sister        breast  . Heart disease Brother   . Cancer Sister        COLON CANCER    Social History   Socioeconomic History  . Marital status: Married    Spouse name: Not on file  . Number of children: Not on file  . Years of education: 52  . Highest education level: Not on file  Occupational History  . Not on file  Tobacco Use  .  Smoking status: Never Smoker  . Smokeless tobacco: Never Used  Vaping Use  . Vaping Use: Never used  Substance and Sexual Activity  . Alcohol use: No    Alcohol/week: 0.0 standard drinks  . Drug use: No    Types: Flunitrazepam  . Sexual activity: Not on file    Comment: MENARCHE AGE 55, G4, P4, PARITY AGE 75, HRT 20+ YEARS    Other Topics Concern  . 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  Patient needs to submit further paperwork to complete- Bonna Gains 08/14/09.   Financial assistance application completed.  Patient does not qualify for [place] 09/12/09.             Social Determinants of Health   Financial Resource Strain: Not on file  Food Insecurity: Not on file  Transportation Needs: Not on file  Physical Activity: Not on file  Stress: Not on file  Social Connections: Not on file  Intimate Partner Violence: Not on file    Review of Systems: ROS negative except for what is noted on the assessment and plan.  Vitals:   09/24/20 1018  BP: (!) 128/101  Pulse: 100  Temp: 98.2 F (36.8 C)  TempSrc: Oral  SpO2: 100%  Weight: 143 lb (64.9 kg)  Height: 4\' 11"  (1.499 m)     Physical Exam: Constitutional: Well-appearing female, no acute distress HENT: normocephalic atraumatic Eyes: conjunctiva non-erythematous Neck: supple Cardiovascular: regular rate and rhythm, no m/r/g Pulmonary/Chest: normal work of breathing on room air Abdominal: soft, non-tender, non-distended MSK: normal bulk and tone Neurological: alert & oriented x 3 Skin: warm and dry Psych: Normal mood and thought process  Assessment & Plan:   See Encounters Tab for problem based charting.  Patient discussed with Dr. Delano Metz, D.O. [facility], PGY-1 Pager: [phone], Phone: [phone] Date 09/24/2020 Time 6:51 PM

## 2020-09-24 NOTE — Assessment & Plan Note (Signed)
Assessment: Patient with persistent nausea since chemotherapy from breast cancer.  States that at times she will be hungry and the smell of foods will cause her to be nauseated and no longer want to eat.  This occurs frequently every few days or so.  Suspect that this is secondary to chemotherapy and/or may have a psychological component from her breast cancer and the treatment associated with it.  Plan: -Zofran as needed for episodes of nausea

## 2020-09-24 NOTE — Assessment & Plan Note (Addendum)
Assessment: Hemoglobin A1c today of 7.4 from 7.7.  Patient has not been taking metformin as both regular and extended release have caused diarrhea.  Past few months have been working with patient to prescribe Jardiance/Farxiga to make it more affordable.  Patient states she never received call back from financial counseling services.  Reached out to [facility] patient's co-pay would be $47 for [place].  Patient states that she would be vital for this.  We also discussed changing her diet.  She states that her diet is high in carbohydrates.  Recommended patient start consuming multigrain or sprout bread.  Plan: -Start Farxiga 5 mg daily, plan to uptitrate at next visit if patient is tolerating well. -Discontinue metformin -A1c improved, repeat in 3 months.  Microalbumin to creatinine ratio pending. -Continue to work on low carbohydrate diet  Addendum: Wilder Glade was $100 a month which patient is unable to afford. Will switch to Jardiance and have pharmacy team assist patient.

## 2020-09-24 NOTE — Assessment & Plan Note (Signed)
Assessment: Blood pressure of 128/101 today, well-controlled on current regimen of chlorthalidone 25 mg and lisinopril 20 mg daily.  Tolerating medications well.  BMP ordered today   Plan: -Continue with chlorthalidone 25 mg daily, lisinopril 20 mg daily -BMP today to assess electrolytes

## 2020-09-24 NOTE — Patient Instructions (Addendum)
Thank you, Ms.Elvie A Yoshida for allowing [place] to provide your care today. Today we discussed  Diabetes We will be starting on a medication Farxiga.  You will take 5 mg daily before breakfast. If you feel like you are unable to afford this, please call us and we will change her medication.  We will be checking your A1c today, and number we used to measure your diabetes.  We also be checking your urine.  We will see you in 3 months to recheck your A1c as well as adjust your medication.  You have any side effects please stop taking the medication call our office.  High Blood Pressure Please continue to take your blood pressure medications.  Your blood pressure looks good today.  Nausea Please take the Zofran as needed for any nausea.  I have ordered the following labs for you:   Lab Orders     BMP8+Anion Gap     Microalbumin / Creatinine Urine Ratio     POC Hbg A1C    Referrals ordered today:   Referral Orders  No referral(s) requested today     I have ordered the following medication/changed the following medications:   Stop the following medications: Medications Discontinued During This Encounter  Medication Reason  . metFORMIN (GLUCOPHAGE-XR) 500 MG 24 hr tablet   . empagliflozin (JARDIANCE) 10 MG TABS tablet   . ondansetron (ZOFRAN ODT) 4 MG disintegrating tablet   . ondansetron (ZOFRAN) 4 MG tablet Reorder     Start the following medications: Meds ordered this encounter  Medications  . dapagliflozin propanediol (FARXIGA) 5 MG TABS tablet    Sig: Take 1 tablet (5 mg total) by mouth daily before breakfast.    Dispense:  30 tablet    Refill:  5  . DISCONTD: ondansetron (ZOFRAN ODT) 4 MG disintegrating tablet    Sig: Take 1 tablet (4 mg total) by mouth every 8 (eight) hours as needed for nausea or vomiting.    Dispense:  20 tablet    Refill:  0  . ondansetron (ZOFRAN) 4 MG tablet    Sig: Take 1 tablet (4 mg total) by mouth every 8 (eight) hours as needed for nausea  or vomiting.    Dispense:  40 tablet    Refill:  0     Follow up: 3 months    Should you have any questions or concerns please call the internal medicine clinic at [phone].     Sanjuana Letters, D.O. [place]

## 2020-09-25 ENCOUNTER — Encounter: Payer: Self-pay | Admitting: Physical Therapy

## 2020-09-25 ENCOUNTER — Other Ambulatory Visit: Payer: Self-pay

## 2020-09-25 ENCOUNTER — Ambulatory Visit (INDEPENDENT_AMBULATORY_CARE_PROVIDER_SITE_OTHER): Payer: Medicare Other | Admitting: Physical Therapy

## 2020-09-25 DIAGNOSIS — M25662 Stiffness of left knee, not elsewhere classified: Secondary | ICD-10-CM

## 2020-09-25 DIAGNOSIS — R2689 Other abnormalities of gait and mobility: Secondary | ICD-10-CM

## 2020-09-25 DIAGNOSIS — R600 Localized edema: Secondary | ICD-10-CM | POA: Diagnosis not present

## 2020-09-25 DIAGNOSIS — M6281 Muscle weakness (generalized): Secondary | ICD-10-CM

## 2020-09-25 DIAGNOSIS — R262 Difficulty in walking, not elsewhere classified: Secondary | ICD-10-CM | POA: Diagnosis not present

## 2020-09-25 DIAGNOSIS — M25562 Pain in left knee: Secondary | ICD-10-CM

## 2020-09-25 LAB — BMP8+ANION GAP
Anion Gap: 19 mmol/L — ABNORMAL HIGH (ref 10.0–18.0)
BUN/Creatinine Ratio: 21 (ref 12–28)
BUN: 20 mg/dL (ref 8–27)
CO2: 25 mmol/L (ref 20–29)
Calcium: 10.3 mg/dL (ref 8.7–10.3)
Chloride: 96 mmol/L (ref 96–106)
Creatinine, Ser: 0.96 mg/dL (ref 0.57–1.00)
Glucose: 261 mg/dL — ABNORMAL HIGH (ref 65–99)
Potassium: 3.5 mmol/L (ref 3.5–5.2)
Sodium: 140 mmol/L (ref 134–144)
eGFR: 62 mL/min/{1.73_m2} (ref >59)

## 2020-09-25 LAB — MICROALBUMIN / CREATININE URINE RATIO
Creatinine, Urine: 137 mg/dL
Microalb/Creat Ratio: 8 mg/g creat (ref 0–29)
Microalbumin, Urine: 11.5 ug/mL

## 2020-09-25 NOTE — Progress Notes (Signed)
Internal Medicine Clinic Attending  Case discussed with Dr. [name]  At the time of the visit.  We reviewed the resident's history and exam and pertinent patient test results.  I agree with the assessment, diagnosis, and plan of care documented in the resident's note.  

## 2020-09-25 NOTE — Therapy (Signed)
[facility] Physical Therapy [address] [place], [place], [address] Phone: [phone]   Fax:  [phone]  Physical Therapy Treatment/Progress Note Progress Note Reporting Period 08/16/20 to 09/25/20  See note below for Objective Data and Assessment of Progress/Goals.       Patient Details  Name: [name] MRN: [id number] Date of Birth: 31-Aug-1945 Referring Provider (PT): Aundra Dubin, [place]   Encounter Date: 09/25/2020   PT End of Session - 09/25/20 1137    Visit Number 10    Number of Visits 16    Date for PT Re-Evaluation 10/11/20    Authorization Type UHC Medicare $30 copay    PT Start Time 1058    PT Stop Time 1138    PT Time Calculation (min) 40 min    Activity Tolerance Patient tolerated treatment well;No increased pain    Behavior During Therapy WFL for tasks assessed/performed           Past Medical History:  Diagnosis Date  . Allergy   . Anemia   . Breast CA ([place])    (Rt) breast ca dx 4/12---  S/P RADICAL MASTECTOMY AND CHEMORADIATION  . Diabetes mellitus ORAL MED  . Fatty liver disease, nonalcoholic   . History of hyperkalemia   . Hx antineoplastic chemo  10/12 - 06/24/11   PACLITAX EL COMPLETED 06/24/11  . Hyperlipidemia   . Hypertension   . Mild nonproliferative diabetic retinopathy of right eye ([place]) 03/19/11   Dr. Joseph Art  . Numbness and tingling    Hx: of in fingers and toes since chemotherapy  . OA (osteoarthritis) of knee    RIGHT LEG  . Obesity   . Renal lesion     Past Surgical History:  Procedure Laterality Date  . COLONOSCOPY W/ POLYPECTOMY     Hx; of  . I & D KNEE WITH POLY EXCHANGE Right 09/03/2012   Procedure: IRRIGATION AND DEBRIDEMENT RIGHT KNEE WITH POLY EXCHANGE;  Surgeon: Kerin Salen, MD;  Location: [place];  Service: Orthopedics;  Laterality: Right;  . JOINT REPLACEMENT    . knee arthroscopy  10-28-1999   RIGHT  . Laparoscopy with laparoscopic right salpingo oophorectomy and lysis of  pelvic and abdominal adhesions  March 2013   Dr. Nori Riis   . LEFT TOTAL KNEE ARTHROPLASTY (Left Knee)  07/30/2020  . MASTECTOMY MODIFIED RADICAL  12-04-2010   W/ LEFT PAC PLACEMENT (RIGHT BREAST W/ AXILLARY CONTENTS/ NODE BX'S  . PORT-A-CATH REMOVAL  09/24/2011   Procedure: REMOVAL PORT-A-CATH;  Surgeon: Joyice Faster. Cornett, MD;  [facility];  Service: General;  Laterality: N/A;  . PORTACATH PLACEMENT     REPLACED PAC DUE TO MALFUNCTION (LEFT)  . TOTAL KNEE ARTHROPLASTY Right 08/16/2012   Procedure: TOTAL KNEE ARTHROPLASTY;  Surgeon: Kerin Salen, MD;  Location: [place];  Service: Orthopedics;  Laterality: Right;  . TOTAL KNEE ARTHROPLASTY Left 07/30/2020   Procedure: LEFT TOTAL KNEE ARTHROPLASTY;  Surgeon: Leandrew Koyanagi, MD;  Location: [place];  Service: Orthopedics;  Laterality: Left;  . TRANSTHORACIC ECHOCARDIOGRAM  10-29-2010   NORMAL LVF/ EF 55-60%/ MILD MV REGURG  . TUBAL LIGATION  YRS AGO  . VAGINAL HYSTERECTOMY  AGE 46   W/ LSO    There were no vitals filed for this visit.   Subjective Assessment - 09/25/20 1058    Subjective knee is doing well; no pain just stiffness    Pertinent History Rt breast cancer 2012, DM, HTN, diabetic retinopathy, OA, obesity, Rt TKA  2014    Limitations Standing;Walking;Sitting    How long can you sit comfortably? Stiffness when she gets up    How long can you stand comfortably? No more than 15 minutes    How long can you walk comfortably? 5 minutes    Patient Stated Goals Improve pain, mobility of Lt knee, walk without device    Currently in Pain? No/denies              [facility] PT Assessment - 09/25/20 1110      Assessment   Medical Diagnosis Z96.652 (ICD-10-CM) - Hx of total knee replacement, left    Referring Provider (PT) Aundra Dubin, PA-C    Onset Date/Surgical Date 07/30/20    Next MD Visit 10/23/20      Observation/Other Assessments   Focus on Therapeutic Outcomes (FOTO)  61      AROM   Left Knee Extension -7    Left Knee  Flexion 93      PROM   Left Knee Flexion 110                         [facility] Adult PT Treatment/Exercise - 09/25/20 1101      Knee/Hip Exercises: Stretches   Passive Hamstring Stretch Left;3 reps;30 seconds    Passive Hamstring Stretch Limitations seated; overpressure at distal thigh    Gastroc Stretch Left;3 reps;30 seconds      Knee/Hip Exercises: Aerobic   Recumbent Bike Seat 2 for 8 minutes for AAROM (not able to do a full revolution)      Knee/Hip Exercises: Machines for Strengthening   Cybex Leg Press bil 100# 3x10; then LLE only 50# 3x10      Knee/Hip Exercises: Seated   Sit to Sand 2 sets;10 reps;without UE support      Knee/Hip Exercises: Supine   Heel Slides AAROM;Left;2 sets;10 reps;Limitations    Heel Slides Limitations With belt 10 seconds                    PT Short Term Goals - 09/20/20 1021      PT SHORT TERM GOAL #1   Title independent with initial HEP    Status Achieved             PT Long Term Goals - 09/25/20 1137      PT LONG TERM GOAL #1   Title independent with advanced HEP    Time 8    Period Weeks    Status On-going    Target Date 10/11/20      PT LONG TERM GOAL #2   Title improve Lt knee AROM 0-110 for irmproved function and mobility    Baseline -7 to 101 on 09/20/2020    Time 8    Status On-going    Target Date 10/11/20      PT LONG TERM GOAL #3   Title amb without AD without difficulty or increase in pain for improved function    Baseline Uses with long distances, otherwise AD free    Time 8    Period Weeks    Status Achieved      PT LONG TERM GOAL #4   Title report pain < 3/10 with activity for improved mobility and function    Baseline Can be 4/10 at times (was 5+/10)    Time 8    Period Weeks    Status Achieved      PT LONG TERM GOAL #5  Title FOTO score improved to 58 for improved function    Baseline 63 (was 43 at evaluation)    Time 8    Period Weeks    Status Achieved                  Plan - 09/25/20 1138    Clinical Impression Statement Pt has met 3 LTGs to date and is demonstrating steady progress towards other goals.  Overall slowly improving with ROM and able to get PROM to 110 deg flexion today.  Will continue to benefit from PT to maximize function.    Personal Factors and Comorbidities Comorbidity 3+    Comorbidities Rt breast cancer 2012, DM, HTN, diabetic retinopathy, OA, obesity, Rt TKA with complications - only able to get 90 deg flexion    Examination-Activity Limitations Bathing;Bed Mobility;Squat;Stairs;Carry;Stand;Locomotion Level;Lift    Examination-Participation Restrictions Community Activity;Shop;Meal Prep;Laundry    Stability/Clinical Decision Making Evolving/Moderate complexity    Rehab Potential Good    PT Frequency 2x / week    PT Duration 8 weeks    PT Treatment/Interventions ADLs/Self Care Home Management;Cryotherapy;Electrical Stimulation;Moist Heat;Balance training;Therapeutic exercise;Therapeutic activities;Functional mobility training;Stair training;Gait training;Neuromuscular re-education;Patient/family education;Manual techniques;Vasopneumatic Device;Taping;Dry needling;Passive range of motion;Scar mobilization    PT Next Visit Plan continue with strengthening, ROM, standing balance    PT Home Exercise Plan Access Code: [email or web address]    Consulted and Agree with Plan of Care Patient           Patient will benefit from skilled therapeutic intervention in order to improve the following deficits and impairments:  Abnormal gait,Increased edema,Increased fascial restricitons,Pain,Decreased strength,Decreased mobility,Decreased balance,Difficulty walking,Decreased range of motion,Impaired flexibility  Visit Diagnosis: Difficulty walking  Muscle weakness (generalized)  Localized edema  Stiffness of left knee, not elsewhere classified  Acute pain of left knee  Other abnormalities of gait and mobility     Problem List Patient  Active Problem List   Diagnosis Date Noted  . Nausea 09/24/2020  . Status post total left knee replacement 07/30/2020  . Fatigue 02/10/2019  . Primary osteoarthritis of left knee 01/25/2019  . Left carpal tunnel syndrome 01/25/2019  . Cervical neuropathy 08/20/2018  . Bilateral carpal tunnel syndrome 06/29/2018  . Depressed mood 01/21/2018  . Need for immunization against influenza 01/17/2018  . Osteopenia 12/13/2013  . Vaginal dryness 12/13/2013  . Peripheral neuropathy 11/02/2013  . Health care maintenance 11/17/2012  . Arthritis of knee, right 08/16/2012  . Breast cancer, right breast ([place]) 09/25/2010  . COLONIC POLYPS, HX OF 07/19/2007  . OBESITY NOS 07/09/2006  . Type 2 diabetes mellitus with renal manifestations ([place]) 03/10/2006  . Hyperlipidemia 03/10/2006  . Hypertension associated with diabetes ([place]) 03/10/2006  . ALLERGIC RHINITIS 03/10/2006  . Fatty liver 03/10/2006     Laureen Abrahams, PT, DPT 09/25/20 11:40 AM    [facility] Physical Therapy [address] [place], [place], [address] Phone: [phone]   Fax:  [phone]  Name: [name] MRN: [id number] Date of Birth: August 18, 1945

## 2020-09-26 ENCOUNTER — Telehealth: Payer: Self-pay

## 2020-09-26 ENCOUNTER — Telehealth: Payer: Self-pay | Admitting: Student

## 2020-09-26 NOTE — Telephone Encounter (Signed)
Pt calling to report the following medication is over $100.00 and she can not afford the medication.  Please call back.   dapagliflozin propanediol (FARXIGA) 5 MG TABS tablet  [id number] [place], [place] - [address] (Ph: [phone]

## 2020-09-26 NOTE — Telephone Encounter (Signed)
Requesting to speak with Dr. Johnney Ou, please call back.

## 2020-09-26 NOTE — Telephone Encounter (Signed)
Returned call to patient. She only wanted to let PCP know that [name] will cost $128 out of pocked. Explained to patient that a message has been forwarded to our Pharmacy staff to assist with either finding a way to make this med more affordable, or switch to alternative med that may be less expensive. Explained that PCP has been made aware as well. She was very Patent attorney. There is nothing else patient wanted to discuss with PCP.

## 2020-09-26 NOTE — Telephone Encounter (Signed)
I believe insurance prefers [employer]. It appears Vania Rea is Tier 2 and Wilder Glade is 3. If switched to [employer] please let me know and I will follow-up with cost at the pharmacy.  Thanks!

## 2020-09-27 ENCOUNTER — Ambulatory Visit (INDEPENDENT_AMBULATORY_CARE_PROVIDER_SITE_OTHER): Payer: Medicare Other | Admitting: Rehabilitative and Restorative Service Providers"

## 2020-09-27 ENCOUNTER — Other Ambulatory Visit: Payer: Self-pay

## 2020-09-27 ENCOUNTER — Encounter: Payer: Self-pay | Admitting: Rehabilitative and Restorative Service Providers"

## 2020-09-27 DIAGNOSIS — R600 Localized edema: Secondary | ICD-10-CM

## 2020-09-27 DIAGNOSIS — R262 Difficulty in walking, not elsewhere classified: Secondary | ICD-10-CM

## 2020-09-27 DIAGNOSIS — M25662 Stiffness of left knee, not elsewhere classified: Secondary | ICD-10-CM

## 2020-09-27 DIAGNOSIS — M6281 Muscle weakness (generalized): Secondary | ICD-10-CM

## 2020-09-27 MED ORDER — EMPAGLIFLOZIN 10 MG PO TABS: 10.0000 mg | ORAL_TABLET | Freq: Every day | ORAL | 3 refills | Status: DC

## 2020-09-27 NOTE — Telephone Encounter (Signed)
Attempted to contact patient to let her know Vania Rea was sent in to pharmacy and has a co-pay of $47. Wanted to discuss if this was feasible for the patient. If patient returns call and she cannot afford medication please let Rosendo Gros know so she may begin process towards patient assistance for either [place] or [place].

## 2020-09-27 NOTE — Addendum Note (Signed)
Addended by: Riesa Pope on: 09/27/2020 09:30 AM   Modules accepted: Orders

## 2020-09-27 NOTE — Therapy (Signed)
[facility] Physical Therapy [address] [place], [place], [address] Phone: [phone]   Fax:  [phone]  Physical Therapy Treatment  Patient Details  Name: [name] MRN: [id number] Date of Birth: 30-Apr-1946 Referring Provider (PT): Aundra Dubin, [place]   Encounter Date: 09/27/2020   PT End of Session - 09/27/20 1054    Visit Number 11    Number of Visits 16    Date for PT Re-Evaluation 10/11/20    Authorization Type UHC Medicare $30 copay    PT Start Time 1015    PT Stop Time 1056    PT Time Calculation (min) 41 min    Activity Tolerance Patient tolerated treatment well    Behavior During Therapy [facility] for tasks assessed/performed           Past Medical History:  Diagnosis Date  . Allergy   . Anemia   . Breast CA ([place])    (Rt) breast ca dx 4/12---  S/P RADICAL MASTECTOMY AND CHEMORADIATION  . Diabetes mellitus ORAL MED  . Fatty liver disease, nonalcoholic   . History of hyperkalemia   . Hx antineoplastic chemo  10/12 - 06/24/11   PACLITAX EL COMPLETED 06/24/11  . Hyperlipidemia   . Hypertension   . Mild nonproliferative diabetic retinopathy of right eye ([place]) 03/19/11   Dr. Joseph Art  . Numbness and tingling    Hx: of in fingers and toes since chemotherapy  . OA (osteoarthritis) of knee    RIGHT LEG  . Obesity   . Renal lesion     Past Surgical History:  Procedure Laterality Date  . COLONOSCOPY W/ POLYPECTOMY     Hx; of  . I & D KNEE WITH POLY EXCHANGE Right 09/03/2012   Procedure: IRRIGATION AND DEBRIDEMENT RIGHT KNEE WITH POLY EXCHANGE;  Surgeon: Kerin Salen, MD;  Location: [place];  Service: Orthopedics;  Laterality: Right;  . JOINT REPLACEMENT    . knee arthroscopy  10-28-1999   RIGHT  . Laparoscopy with laparoscopic right salpingo oophorectomy and lysis of pelvic and abdominal adhesions  March 2013   Dr. Nori Riis   . LEFT TOTAL KNEE ARTHROPLASTY (Left Knee)  07/30/2020  . MASTECTOMY MODIFIED RADICAL  12-04-2010   W/  LEFT PAC PLACEMENT (RIGHT BREAST W/ AXILLARY CONTENTS/ NODE BX'S  . PORT-A-CATH REMOVAL  09/24/2011   Procedure: REMOVAL PORT-A-CATH;  Surgeon: Joyice Faster. Cornett, MD;  Location[facility];  Service: General;  Laterality: N/A;  . PORTACATH PLACEMENT     REPLACED PAC DUE TO MALFUNCTION (LEFT)  . TOTAL KNEE ARTHROPLASTY Right 08/16/2012   Procedure: TOTAL KNEE ARTHROPLASTY;  Surgeon: Kerin Salen, MD;  Location: [place];  Service: Orthopedics;  Laterality: Right;  . TOTAL KNEE ARTHROPLASTY Left 07/30/2020   Procedure: LEFT TOTAL KNEE ARTHROPLASTY;  Surgeon: Leandrew Koyanagi, MD;  Location: [place];  Service: Orthopedics;  Laterality: Left;  . TRANSTHORACIC ECHOCARDIOGRAM  10-29-2010   NORMAL LVF/ EF 55-60%/ MILD MV REGURG  . TUBAL LIGATION  YRS AGO  . VAGINAL HYSTERECTOMY  AGE 75   W/ LSO    There were no vitals filed for this visit.   Subjective Assessment - 09/27/20 1020    Subjective Juvia reports she is not taking any pain medication.  AROM is her biggest concern.    Pertinent History Rt breast cancer 2012, DM, HTN, diabetic retinopathy, OA, obesity, Rt TKA 2014    Limitations Standing;Walking;Sitting    How long can you sit comfortably? Sits as long as  she wants but she has stiffness when she gets up    How long can you stand comfortably? No more than 15 minutes    How long can you walk comfortably? 5 minutes    Patient Stated Goals Improve pain, mobility of Lt knee, walk without device    Currently in Pain? No/denies    Pain Score 0-No pain    Pain Location Knee    Pain Orientation Left    Pain Descriptors / Indicators Aching;Tightness    Pain Type Chronic pain;Surgical pain    Pain Onset More than a month ago    Pain Frequency Intermittent    Aggravating Factors  End range.  Zyanna reports she does like pushing through the pain with her AROM.    Pain Relieving Factors Exercises and change of position    Effect of Pain on Daily Activities Limited AROM affects gait, squatting, any  activities requiring full AROM    Multiple Pain Sites No              [facility] PT Assessment - 09/27/20 0001      AROM   Left Knee Extension -6      PROM   Left Knee Flexion 102                         [facility] Adult PT Treatment/Exercise - 09/27/20 0001      Exercises   Exercises Knee/Hip      Knee/Hip Exercises: Aerobic   Recumbent Bike Seat 2 for 8 minutes for AAROM (not able to do a full revolution)      Knee/Hip Exercises: Machines for Strengthening   Cybex Leg Press Bilateral push 112# 15X 2 sets slow eccentrics and focus on full extension at the top.  Also 56# single leg (L only) 2 sets of 15 slow eccentrics with self-overpressure into extension.      Knee/Hip Exercises: Supine   Quad Sets Strengthening;Both;3 sets;10 reps;Limitations    Quad Sets Limitations 5 seconds    Heel Slides AAROM;Left;2 sets;10 reps;Limitations    Heel Slides Limitations With belt 10 seconds                  PT Education - 09/27/20 1030    Education Details Emphasis remains on current HEP.  Minimal cues needed with HEP.    Person(s) Educated Patient    Methods Explanation;Demonstration;Tactile cues;Verbal cues    Comprehension Verbalized understanding;Tactile cues required;Need further instruction;Returned demonstration;Verbal cues required            PT Short Term Goals - 09/27/20 1031      PT SHORT TERM GOAL #1   Title independent with initial HEP    Status Achieved             PT Long Term Goals - 09/27/20 1031      PT LONG TERM GOAL #1   Title independent with advanced HEP    Time 8    Period Weeks    Status On-going      PT LONG TERM GOAL #2   Title improve Lt knee AROM 0-110 for irmproved function and mobility    Baseline -6 to 104 on 09/20/2020    Time 8    Status On-going      PT LONG TERM GOAL #3   Title amb without AD without difficulty or increase in pain for improved function    Baseline Uses with long distances, otherwise AD free  Time 8    Period Weeks    Status Achieved      PT LONG TERM GOAL #4   Title report pain < 3/10 with activity for improved mobility and function    Baseline Can be 4/10 at times (was 5+/10)    Time 8    Period Weeks    Status Achieved      PT LONG TERM GOAL #5   Title FOTO score improved to 58 for improved function    Baseline 63 (was 43 at evaluation)    Time 8    Period Weeks    Status Achieved                 Plan - 09/27/20 1055    Clinical Impression Statement Kelina continues to give execellent effort in the clinic.  She has inconsistent HEP compliance where she is very good with her HEP one day but will do no exercises on another.  I reinforced the importance of consistency with her HEP for full knee extension and continued flexion AROM gains.  Continue current plan to meet AROM, strength and functional goals.    Personal Factors and Comorbidities Comorbidity 3+    Comorbidities Rt breast cancer 2012, DM, HTN, diabetic retinopathy, OA, obesity, Rt TKA with complications - only able to get 90 deg flexion    Examination-Activity Limitations Bathing;Bed Mobility;Squat;Stairs;Carry;Stand;Locomotion Level;Lift    Examination-Participation Restrictions Community Activity;Shop;Meal Prep;Laundry    Stability/Clinical Decision Making Evolving/Moderate complexity    Rehab Potential Good    PT Frequency 2x / week    PT Duration 8 weeks    PT Treatment/Interventions ADLs/Self Care Home Management;Cryotherapy;Electrical Stimulation;Moist Heat;Balance training;Therapeutic exercise;Therapeutic activities;Functional mobility training;Stair training;Gait training;Neuromuscular re-education;Patient/family education;Manual techniques;Vasopneumatic Device;Taping;Dry needling;Passive range of motion;Scar mobilization    PT Next Visit Plan Extension AROM #1 prority, flexion AROM #2, quadriceps strength #3    PT Home Exercise Plan Access Code: [email or web address]    Consulted and Agree with Plan of  Care Patient           Patient will benefit from skilled therapeutic intervention in order to improve the following deficits and impairments:  Abnormal gait,Increased edema,Increased fascial restricitons,Pain,Decreased strength,Decreased mobility,Decreased balance,Difficulty walking,Decreased range of motion,Impaired flexibility  Visit Diagnosis: Difficulty walking  Muscle weakness (generalized)  Localized edema  Stiffness of left knee, not elsewhere classified     Problem List Patient Active Problem List   Diagnosis Date Noted  . Nausea 09/24/2020  . Status post total left knee replacement 07/30/2020  . Fatigue 02/10/2019  . Primary osteoarthritis of left knee 01/25/2019  . Left carpal tunnel syndrome 01/25/2019  . Cervical neuropathy 08/20/2018  . Bilateral carpal tunnel syndrome 06/29/2018  . Depressed mood 01/21/2018  . Need for immunization against influenza 01/17/2018  . Osteopenia 12/13/2013  . Vaginal dryness 12/13/2013  . Peripheral neuropathy 11/02/2013  . Health care maintenance 11/17/2012  . Arthritis of knee, right 08/16/2012  . Breast cancer, right breast ([place]) 09/25/2010  . COLONIC POLYPS, HX OF 07/19/2007  . OBESITY NOS 07/09/2006  . Type 2 diabetes mellitus with renal manifestations ([place]) 03/10/2006  . Hyperlipidemia 03/10/2006  . Hypertension associated with diabetes ([place]) 03/10/2006  . ALLERGIC RHINITIS 03/10/2006  . Fatty liver 03/10/2006    Farley Ly PT, MPT 09/27/2020, 10:58 AM  [facility] Physical Therapy [address] [place], [place], [address] Phone: [phone]   Fax:  [phone]  Name: SADEEN WIEGEL MRN: [id number] Date of Birth: Sep 11, 1945

## 2020-10-02 ENCOUNTER — Other Ambulatory Visit: Payer: Self-pay

## 2020-10-02 ENCOUNTER — Ambulatory Visit (INDEPENDENT_AMBULATORY_CARE_PROVIDER_SITE_OTHER): Payer: Medicare Other | Admitting: Physical Therapy

## 2020-10-02 ENCOUNTER — Encounter: Payer: Self-pay | Admitting: Physical Therapy

## 2020-10-02 DIAGNOSIS — R262 Difficulty in walking, not elsewhere classified: Secondary | ICD-10-CM

## 2020-10-02 DIAGNOSIS — M25562 Pain in left knee: Secondary | ICD-10-CM

## 2020-10-02 DIAGNOSIS — M25662 Stiffness of left knee, not elsewhere classified: Secondary | ICD-10-CM | POA: Diagnosis not present

## 2020-10-02 DIAGNOSIS — R2689 Other abnormalities of gait and mobility: Secondary | ICD-10-CM

## 2020-10-02 DIAGNOSIS — R600 Localized edema: Secondary | ICD-10-CM

## 2020-10-02 DIAGNOSIS — M6281 Muscle weakness (generalized): Secondary | ICD-10-CM

## 2020-10-02 NOTE — Therapy (Signed)
[facility] Physical Therapy [address] [place], [place], [address] Phone: [phone]   Fax:  [phone]  Physical Therapy Treatment  Patient Details  Name: [name] MRN: [id number] Date of Birth: 06/14/1945 Referring Provider (PT): Aundra Dubin, [place]   Encounter Date: 10/02/2020   PT End of Session - 10/02/20 1139    Visit Number 12    Number of Visits 16    Date for PT Re-Evaluation 10/11/20    Authorization Type UHC Medicare $30 copay    PT Start Time 1100    PT Stop Time 1142    PT Time Calculation (min) 42 min    Activity Tolerance Patient tolerated treatment well    Behavior During Therapy [facility] for tasks assessed/performed           Past Medical History:  Diagnosis Date  . Allergy   . Anemia   . Breast CA ([place])    (Rt) breast ca dx 4/12---  S/P RADICAL MASTECTOMY AND CHEMORADIATION  . Diabetes mellitus ORAL MED  . Fatty liver disease, nonalcoholic   . History of hyperkalemia   . Hx antineoplastic chemo  10/12 - 06/24/11   PACLITAX EL COMPLETED 06/24/11  . Hyperlipidemia   . Hypertension   . Mild nonproliferative diabetic retinopathy of right eye ([place]) 03/19/11   Dr. Joseph Art  . Numbness and tingling    Hx: of in fingers and toes since chemotherapy  . OA (osteoarthritis) of knee    RIGHT LEG  . Obesity   . Renal lesion     Past Surgical History:  Procedure Laterality Date  . COLONOSCOPY W/ POLYPECTOMY     Hx; of  . I & D KNEE WITH POLY EXCHANGE Right 09/03/2012   Procedure: IRRIGATION AND DEBRIDEMENT RIGHT KNEE WITH POLY EXCHANGE;  Surgeon: Kerin Salen, MD;  Location: [place];  Service: Orthopedics;  Laterality: Right;  . JOINT REPLACEMENT    . knee arthroscopy  10-28-1999   RIGHT  . Laparoscopy with laparoscopic right salpingo oophorectomy and lysis of pelvic and abdominal adhesions  March 2013   Dr. Nori Riis   . LEFT TOTAL KNEE ARTHROPLASTY (Left Knee)  07/30/2020  . MASTECTOMY MODIFIED RADICAL  12-04-2010   W/  LEFT PAC PLACEMENT (RIGHT BREAST W/ AXILLARY CONTENTS/ NODE BX'S  . PORT-A-CATH REMOVAL  09/24/2011   Procedure: REMOVAL PORT-A-CATH;  Surgeon: Joyice Faster. Cornett, MD;  [facility];  Service: General;  Laterality: N/A;  . PORTACATH PLACEMENT     REPLACED PAC DUE TO MALFUNCTION (LEFT)  . TOTAL KNEE ARTHROPLASTY Right 08/16/2012   Procedure: TOTAL KNEE ARTHROPLASTY;  Surgeon: Kerin Salen, MD;  Location: [place];  Service: Orthopedics;  Laterality: Right;  . TOTAL KNEE ARTHROPLASTY Left 07/30/2020   Procedure: LEFT TOTAL KNEE ARTHROPLASTY;  Surgeon: Leandrew Koyanagi, MD;  Location: [place];  Service: Orthopedics;  Laterality: Left;  . TRANSTHORACIC ECHOCARDIOGRAM  10-29-2010   NORMAL LVF/ EF 55-60%/ MILD MV REGURG  . TUBAL LIGATION  YRS AGO  . VAGINAL HYSTERECTOMY  AGE 88   W/ LSO    There were no vitals filed for this visit.   Subjective Assessment - 10/02/20 1102    Subjective doing well; worked on exercises over the weekend    Pertinent History Rt breast cancer 2012, DM, HTN, diabetic retinopathy, OA, obesity, Rt TKA 2014    Limitations Standing;Walking;Sitting    How long can you sit comfortably? Sits as long as she wants but she has stiffness when  she gets up    How long can you stand comfortably? No more than 15 minutes    How long can you walk comfortably? 5 minutes    Patient Stated Goals Improve pain, mobility of Lt knee, walk without device    Currently in Pain? No/denies    Pain Onset More than a month ago                             [facility] Adult PT Treatment/Exercise - 10/02/20 1103      Knee/Hip Exercises: Stretches   Passive Hamstring Stretch Left;3 reps;30 seconds    Passive Hamstring Stretch Limitations seated; overpressure at distal thigh      Knee/Hip Exercises: Aerobic   Recumbent Bike Seat 2 for 8 minutes for AAROM (not able to do a full revolution)      Knee/Hip Exercises: Machines for Strengthening   Cybex Knee Extension 5# LLE with min A from  RLE 3x10    Cybex Knee Flexion 15# LLE with min A from RLE 3x10 (pillow behind back for better positioning)    Cybex Leg Press Bilateral push 112# 15X 2 sets slow eccentrics and focus on full extension at the top.  Also 56# single leg (L only) 2 sets of 15 slow eccentrics with self-overpressure into extension.      Knee/Hip Exercises: Seated   Long Arc Quad Strengthening;Left;10 reps;3 sets    [employer] Weight 3 lbs.    Hamstring Limitations Tailgate knee flexion 2 minutes      Manual Therapy   Passive ROM Lt knee flexion in sitting with contract/relax - good response to contract/relax today; supine knee extension with grade 2-3 ext mobs                    PT Short Term Goals - 09/27/20 1031      PT SHORT TERM GOAL #1   Title independent with initial HEP    Status Achieved             PT Long Term Goals - 09/27/20 1031      PT LONG TERM GOAL #1   Title independent with advanced HEP    Time 8    Period Weeks    Status On-going      PT LONG TERM GOAL #2   Title improve Lt knee AROM 0-110 for irmproved function and mobility    Baseline -6 to 104 on 09/20/2020    Time 8    Status On-going      PT LONG TERM GOAL #3   Title amb without AD without difficulty or increase in pain for improved function    Baseline Uses with long distances, otherwise AD free    Time 8    Period Weeks    Status Achieved      PT LONG TERM GOAL #4   Title report pain < 3/10 with activity for improved mobility and function    Baseline Can be 4/10 at times (was 5+/10)    Time 8    Period Weeks    Status Achieved      PT LONG TERM GOAL #5   Title FOTO score improved to 58 for improved function    Baseline 63 (was 43 at evaluation)    Time 8    Period Weeks    Status Achieved  Plan - 10/02/20 1139    Clinical Impression Statement Pt continues to progress well with PT, and reports she is working harder at home to achieve full extension.  Will continue to  benefit from PT to maximize function.    Personal Factors and Comorbidities Comorbidity 3+    Comorbidities Rt breast cancer 2012, DM, HTN, diabetic retinopathy, OA, obesity, Rt TKA with complications - only able to get 90 deg flexion    Examination-Activity Limitations Bathing;Bed Mobility;Squat;Stairs;Carry;Stand;Locomotion Level;Lift    Examination-Participation Restrictions Community Activity;Shop;Meal Prep;Laundry    Stability/Clinical Decision Making Evolving/Moderate complexity    Rehab Potential Good    PT Frequency 2x / week    PT Duration 8 weeks    PT Treatment/Interventions ADLs/Self Care Home Management;Cryotherapy;Electrical Stimulation;Moist Heat;Balance training;Therapeutic exercise;Therapeutic activities;Functional mobility training;Stair training;Gait training;Neuromuscular re-education;Patient/family education;Manual techniques;Vasopneumatic Device;Taping;Dry needling;Passive range of motion;Scar mobilization    PT Next Visit Plan Extension AROM #1 prority, flexion AROM #2, quadriceps strength #3 - also work functional strength and balance    PT Home Exercise Plan Access Code: [email or web address]    Consulted and Agree with Plan of Care Patient           Patient will benefit from skilled therapeutic intervention in order to improve the following deficits and impairments:  Abnormal gait,Increased edema,Increased fascial restricitons,Pain,Decreased strength,Decreased mobility,Decreased balance,Difficulty walking,Decreased range of motion,Impaired flexibility  Visit Diagnosis: Difficulty walking  Muscle weakness (generalized)  Localized edema  Stiffness of left knee, not elsewhere classified  Acute pain of left knee  Other abnormalities of gait and mobility     Problem List Patient Active Problem List   Diagnosis Date Noted  . Nausea 09/24/2020  . Status post total left knee replacement 07/30/2020  . Fatigue 02/10/2019  . Primary osteoarthritis of left knee 01/25/2019   . Left carpal tunnel syndrome 01/25/2019  . Cervical neuropathy 08/20/2018  . Bilateral carpal tunnel syndrome 06/29/2018  . Depressed mood 01/21/2018  . Need for immunization against influenza 01/17/2018  . Osteopenia 12/13/2013  . Vaginal dryness 12/13/2013  . Peripheral neuropathy 11/02/2013  . Health care maintenance 11/17/2012  . Arthritis of knee, right 08/16/2012  . Breast cancer, right breast ([place]) 09/25/2010  . COLONIC POLYPS, HX OF 07/19/2007  . OBESITY NOS 07/09/2006  . Type 2 diabetes mellitus with renal manifestations ([place]) 03/10/2006  . Hyperlipidemia 03/10/2006  . Hypertension associated with diabetes ([place]) 03/10/2006  . ALLERGIC RHINITIS 03/10/2006  . Fatty liver 03/10/2006      Laureen Abrahams, PT, DPT 10/02/20 11:41 AM    [facility] Physical Therapy [address] [place], [place], [address] Phone: [phone]   Fax:  [phone]  Name: SARETTA DAHLEM MRN: [id number] Date of Birth: 1945/10/21

## 2020-10-04 ENCOUNTER — Other Ambulatory Visit: Payer: Self-pay

## 2020-10-04 ENCOUNTER — Encounter: Payer: Self-pay | Admitting: Rehabilitative and Restorative Service Providers"

## 2020-10-04 ENCOUNTER — Ambulatory Visit (INDEPENDENT_AMBULATORY_CARE_PROVIDER_SITE_OTHER): Payer: Medicare Other | Admitting: Rehabilitative and Restorative Service Providers"

## 2020-10-04 DIAGNOSIS — R600 Localized edema: Secondary | ICD-10-CM

## 2020-10-04 DIAGNOSIS — M25662 Stiffness of left knee, not elsewhere classified: Secondary | ICD-10-CM | POA: Diagnosis not present

## 2020-10-04 DIAGNOSIS — R262 Difficulty in walking, not elsewhere classified: Secondary | ICD-10-CM | POA: Diagnosis not present

## 2020-10-04 DIAGNOSIS — M6281 Muscle weakness (generalized): Secondary | ICD-10-CM | POA: Diagnosis not present

## 2020-10-04 NOTE — Therapy (Addendum)
[facility] Physical Therapy [address] [place], [place], [address] Phone: [phone]   Fax:  [phone]  Physical Therapy Treatment  Patient Details  Name: KIMIAH HIBNER MRN: [id number] Date of Birth: March 19, 1946 Referring Provider (PT): Aundra Dubin, PA-C  PHYSICAL THERAPY DISCHARGE SUMMARY  Visits from Start of Care: 13  Current functional level related to goals / functional outcomes: Very good-see note   Remaining deficits: See note   Education / Equipment: HEP   Patient agrees to discharge. Patient goals were met. Patient is being discharged due to being pleased with the current functional level.  Encounter Date: 10/04/2020   PT End of Session - 10/04/20 1704     Visit Number 13    Number of Visits 16    Date for PT Re-Evaluation 10/11/20    Authorization Type UHC Medicare $30 copay    PT Start Time 1100    PT Stop Time 1145    PT Time Calculation (min) 45 min    Activity Tolerance Patient tolerated treatment well    Behavior During Therapy WFL for tasks assessed/performed             Past Medical History:  Diagnosis Date   Allergy    Anemia    Breast CA ([place])    (Rt) breast ca dx 4/12---  S/P RADICAL MASTECTOMY AND CHEMORADIATION   Diabetes mellitus ORAL MED   Fatty liver disease, nonalcoholic    History of hyperkalemia    Hx antineoplastic chemo  10/12 - 06/24/11   PACLITAX EL COMPLETED 06/24/11   Hyperlipidemia    Hypertension    Mild nonproliferative diabetic retinopathy of right eye ([place]) 03/19/11   Dr. Benjaman Pott Poudyal   Numbness and tingling    Hx: of in fingers and toes since chemotherapy   OA (osteoarthritis) of knee    RIGHT LEG   Obesity    Renal lesion     Past Surgical History:  Procedure Laterality Date   COLONOSCOPY W/ POLYPECTOMY     Hx; of   I & D KNEE WITH POLY EXCHANGE Right 09/03/2012   Procedure: IRRIGATION AND DEBRIDEMENT RIGHT KNEE WITH POLY EXCHANGE;  Surgeon: Kerin Salen, MD;  Location: [place];  Service: Orthopedics;  Laterality: Right;   JOINT REPLACEMENT     knee arthroscopy  10-28-1999   RIGHT   Laparoscopy with laparoscopic right salpingo oophorectomy and lysis of pelvic and abdominal adhesions  March 2013   Dr. Nori Riis    LEFT TOTAL KNEE ARTHROPLASTY (Left Knee)  07/30/2020   MASTECTOMY MODIFIED RADICAL  12-04-2010   W/ LEFT PAC PLACEMENT (RIGHT BREAST W/ AXILLARY CONTENTS/ NODE BX'S   PORT-A-CATH REMOVAL  09/24/2011   Procedure: REMOVAL PORT-A-CATH;  Surgeon: Joyice Faster. Cornett, MD;  [facility];  Service: General;  Laterality: N/A;   PORTACATH PLACEMENT     REPLACED PAC DUE TO MALFUNCTION (LEFT)   TOTAL KNEE ARTHROPLASTY Right 08/16/2012   Procedure: TOTAL KNEE ARTHROPLASTY;  Surgeon: Kerin Salen, MD;  Location: [place];  Service: Orthopedics;  Laterality: Right;   TOTAL KNEE ARTHROPLASTY Left 07/30/2020   Procedure: LEFT TOTAL KNEE ARTHROPLASTY;  Surgeon: Leandrew Koyanagi, MD;  Location: [place];  Service: Orthopedics;  Laterality: Left;   TRANSTHORACIC ECHOCARDIOGRAM  10-29-2010   NORMAL LVF/ EF 55-60%/ MILD MV REGURG   TUBAL LIGATION  YRS AGO   VAGINAL HYSTERECTOMY  AGE 51   W/ LSO    There were no vitals filed for this visit.  Subjective Assessment - 10/04/20 1101     Subjective Sleep is no longer a problem.  Laundry day is a bit more painful due to increased time on the L knee.  She rates herself at "about 85%" functional.    Pertinent History Rt breast cancer 2012, DM, HTN, diabetic retinopathy, OA, obesity, Rt TKA 2014    Limitations Standing;Walking;Sitting    How long can you sit comfortably? Sits as long as she wants but she has stiffness when she gets up    How long can you stand comfortably? No more than 15 minutes    How long can you walk comfortably? 5 minutes    Patient Stated Goals Improve pain, mobility of Lt knee, walk without device    Currently in Pain? No/denies    Pain Score 0-No pain    Pain Location Knee    Pain Orientation Left    Pain  Descriptors / Indicators Aching;Tightness    Pain Type Chronic pain;Surgical pain    Pain Onset More than a month ago    Pain Frequency Intermittent    Aggravating Factors  End range AROM.  Mozetta admits having a tough time "pushing through the pain."    Pain Relieving Factors Ice and exercises    Effect of Pain on Daily Activities Laundry is challenging or too much time on her feet    Multiple Pain Sites No                [facility] PT Assessment - 10/04/20 0001       AROM   Left Knee Extension -6      PROM   Left Knee Flexion 108                           [facility] Adult PT Treatment/Exercise - 10/04/20 0001       Exercises   Exercises Knee/Hip      Knee/Hip Exercises: Aerobic   Recumbent Bike Seat 2 for 8 minutes for AAROM (not able to do a full revolution)      Knee/Hip Exercises: Machines for Strengthening   Cybex Leg Press Bilateral push 112# 15X 2 sets slow eccentrics and focus on full extension at the top.  Also 56# single leg (L only) 2 sets of 15 slow eccentrics with self-overpressure into extension.      Knee/Hip Exercises: Seated   Hamstring Limitations Tailgate knee flexion 2 minutes      Knee/Hip Exercises: Supine   Quad Sets Strengthening;Both;3 sets;10 reps;Limitations    Quad Sets Limitations 5 seconds    Heel Slides AAROM;Left;2 sets;10 reps;Limitations    Heel Slides Limitations With belt 10 seconds                    PT Education - 10/04/20 1111     Education Details Reviewed HEP.  Emphasis on AROM, strength and WB function/endurance.    Person(s) Educated Patient    Methods Explanation;Demonstration;Tactile cues;Verbal cues    Comprehension Verbal cues required;Need further instruction;Returned demonstration;Verbalized understanding;Tactile cues required              PT Short Term Goals - 10/04/20 1112       PT SHORT TERM GOAL #1   Title independent with initial HEP    Status Achieved               PT  Long Term Goals - 10/04/20 1112       PT  LONG TERM GOAL #1   Title independent with advanced HEP    Time 8    Period Weeks    Status On-going      PT LONG TERM GOAL #2   Title improve Lt knee AROM 0-110 for irmproved function and mobility    Baseline -6 to 108 on 10/04/2020    Time 8    Status On-going      PT LONG TERM GOAL #3   Title amb without AD without difficulty or increase in pain for improved function    Baseline No longer uses cane    Time 8    Period Weeks    Status Achieved      PT LONG TERM GOAL #4   Title report pain < 3/10 with activity for improved mobility and function    Baseline Can be 3/10 at times (was 5+/10)    Time 8    Period Weeks    Status Achieved      PT LONG TERM GOAL #5   Title FOTO score improved to 58 for improved function    Baseline 63 (was 43 at evaluation)    Time 8    Period Weeks    Status Achieved                   Plan - 10/04/20 1705     Clinical Impression Statement Anyae is doing very well with her post-surgical functional progress.  Additional work on knee exetnsion AROM and quadriceps strength will help with remaining WB endurance impairments.  I expect Nozomi should be ready for transfer into more independent physical therapy in 2-3 visits.    Personal Factors and Comorbidities Comorbidity 3+    Comorbidities Rt breast cancer 2012, DM, HTN, diabetic retinopathy, OA, obesity, Rt TKA with complications - only able to get 90 deg flexion    Examination-Activity Limitations Bathing;Bed Mobility;Squat;Stairs;Carry;Stand;Locomotion Level;Lift    Examination-Participation Restrictions Community Activity;Shop;Meal Prep;Laundry    Stability/Clinical Decision Making Evolving/Moderate complexity    Rehab Potential Good    PT Frequency 2x / week    PT Duration 8 weeks    PT Treatment/Interventions ADLs/Self Care Home Management;Cryotherapy;Electrical Stimulation;Moist Heat;Balance training;Therapeutic exercise;Therapeutic  activities;Functional mobility training;Stair training;Gait training;Neuromuscular re-education;Patient/family education;Manual techniques;Vasopneumatic Device;Taping;Dry needling;Passive range of motion;Scar mobilization    PT Next Visit Plan Extension AROM #1 prority, flexion AROM #2, quadriceps strength #3 - also work functional strength and balance    PT Home Exercise Plan Access Code: [email or web address]    Consulted and Agree with Plan of Care Patient             Patient will benefit from skilled therapeutic intervention in order to improve the following deficits and impairments:  Abnormal gait,Increased edema,Increased fascial restricitons,Pain,Decreased strength,Decreased mobility,Decreased balance,Difficulty walking,Decreased range of motion,Impaired flexibility  Visit Diagnosis: Difficulty walking  Muscle weakness (generalized)  Localized edema  Stiffness of left knee, not elsewhere classified     Problem List Patient Active Problem List   Diagnosis Date Noted   Nausea 09/24/2020   Status post total left knee replacement 07/30/2020   Fatigue 02/10/2019   Primary osteoarthritis of left knee 01/25/2019   Left carpal tunnel syndrome 01/25/2019   Cervical neuropathy 08/20/2018   Bilateral carpal tunnel syndrome 06/29/2018   Depressed mood 01/21/2018   Need for immunization against influenza 01/17/2018   Osteopenia 12/13/2013   Vaginal dryness 12/13/2013   Peripheral neuropathy 11/02/2013   Health care maintenance 11/17/2012   Arthritis of knee, right 08/16/2012  Breast cancer, right breast ([place]) 09/25/2010   COLONIC POLYPS, HX OF 07/19/2007   OBESITY NOS 07/09/2006   Type 2 diabetes mellitus with renal manifestations ([place]) 03/10/2006   Hyperlipidemia 03/10/2006   Hypertension associated with diabetes ([place]) 03/10/2006   ALLERGIC RHINITIS 03/10/2006   Fatty liver 03/10/2006    Farley Ly PT, MPT 10/04/2020, 5:07 PM  [facility] Physical Therapy [address] [place], [place], [address] Phone: [phone]   Fax:  [phone]  Name: JANAY CANAN MRN: [id number] Date of Birth: August 11, 1945

## 2020-10-23 ENCOUNTER — Ambulatory Visit (INDEPENDENT_AMBULATORY_CARE_PROVIDER_SITE_OTHER): Payer: Medicare Other | Admitting: Orthopaedic Surgery

## 2020-10-23 ENCOUNTER — Encounter: Payer: Self-pay | Admitting: Orthopaedic Surgery

## 2020-10-23 ENCOUNTER — Other Ambulatory Visit: Payer: Self-pay

## 2020-10-23 VITALS — Ht 59.0 in | Wt 143.0 lb

## 2020-10-23 DIAGNOSIS — Z96652 Presence of left artificial knee joint: Secondary | ICD-10-CM

## 2020-10-23 MED ORDER — AMOXICILLIN 500 MG PO CAPS: 2000.0000 mg | ORAL_CAPSULE | Freq: Once | ORAL | 6 refills | Status: AC

## 2020-10-23 NOTE — Progress Notes (Signed)
Post-Op Visit Note   Patient: [name]           Date of Birth: [birth year]           MRN: [id number] Visit Date: 10/23/2020 PCP: Riesa Pope, MD   Assessment & Plan:  Chief Complaint:  Chief Complaint  Patient presents with   Left Knee - Follow-up    Left TKA 07/30/2020   Visit Diagnoses:  1. Status post total left knee replacement     Plan: [name] is a 75 y.o. female following up 3 months status post left total knee arthroplasty, DOS 07/30/20. She is doing very well today. She notes occasional discomfort in the knee but overall markedly improved compared to before surgery. She is not taking anything for pain. She completed physical therapy. Overall she is able to do everything she needs to do. Her goal is to get back to walking for exercise.  Exam demonstrates a well healed surgical incision. ROM 0 - 100 degrees without pain. Stable varus and valgus stress. Distal neurovascular exam intact.   At this point [name] can return to all her regular activities as tolerable. Dental prophylaxis was discussed and prescription sent for Amoxicillin to take 30 minutes before her next dental appointment in July. We provided her with a 6 month handicap pass. We'll see her back in 3 months with repeat x-rays of the left knee.   Follow-Up Instructions: Return in about 3 months (around 01/23/2021).   Orders:  No orders of the defined types were placed in this encounter.  Meds ordered this encounter  Medications   amoxicillin (AMOXIL) 500 MG capsule    Sig: Take 4 capsules (2,000 mg total) by mouth once for 1 dose.    Dispense:  4 capsule    Refill:  6   Imaging: No new imaging.  PMFS History: Patient Active Problem List   Diagnosis Date Noted   Nausea 09/24/2020   Status post total left knee replacement 07/30/2020   Fatigue 02/10/2019   Primary osteoarthritis of left knee 01/25/2019   Left carpal tunnel syndrome 01/25/2019   Cervical neuropathy  08/20/2018   Bilateral carpal tunnel syndrome 06/29/2018   Depressed mood 01/21/2018   Need for immunization against influenza 01/17/2018   Osteopenia 12/13/2013   Vaginal dryness 12/13/2013   Peripheral neuropathy 11/02/2013   Health care maintenance 11/17/2012   Arthritis of knee, right 08/16/2012   Breast cancer, right breast ([place]) 09/25/2010   COLONIC POLYPS, HX OF 07/19/2007   OBESITY NOS 07/09/2006   Type 2 diabetes mellitus with renal manifestations ([place]) 03/10/2006   Hyperlipidemia 03/10/2006   Hypertension associated with diabetes ([place]) 03/10/2006   ALLERGIC RHINITIS 03/10/2006   Fatty liver 03/10/2006   Past Medical History:  Diagnosis Date   Allergy    Anemia    Breast CA ([place])    (Rt) breast ca dx 4/12---  S/P RADICAL MASTECTOMY AND CHEMORADIATION   Diabetes mellitus ORAL MED   Fatty liver disease, nonalcoholic    History of hyperkalemia    Hx antineoplastic chemo  10/12 - 06/24/11   PACLITAX EL COMPLETED 06/24/11   Hyperlipidemia    Hypertension    Mild nonproliferative diabetic retinopathy of right eye ([place]) 03/19/11   Dr. Joseph Art   Numbness and tingling    Hx: of in fingers and toes since chemotherapy   OA (osteoarthritis) of knee    RIGHT LEG   Obesity    Renal lesion     Family  History  Problem Relation Age of Onset   Stroke Father    Heart disease Sister    Cancer Sister        breast   Heart disease Brother    Cancer Sister        COLON CANCER    Past Surgical History:  Procedure Laterality Date   COLONOSCOPY W/ POLYPECTOMY     Hx; of   I & D KNEE WITH POLY EXCHANGE Right 09/03/2012   Procedure: IRRIGATION AND DEBRIDEMENT RIGHT KNEE WITH POLY EXCHANGE;  Surgeon: Kerin Salen, MD;  Location: [place];  Service: Orthopedics;  Laterality: Right;   JOINT REPLACEMENT     knee arthroscopy  10-28-1999   RIGHT   Laparoscopy with laparoscopic right salpingo oophorectomy and lysis of pelvic and abdominal adhesions  March 2013   Dr. Nori Riis    LEFT  TOTAL KNEE ARTHROPLASTY (Left Knee)  07/30/2020   MASTECTOMY MODIFIED RADICAL  12-04-2010   W/ LEFT PAC PLACEMENT (RIGHT BREAST W/ AXILLARY CONTENTS/ NODE BX'S   PORT-A-CATH REMOVAL  09/24/2011   Procedure: REMOVAL PORT-A-CATH;  Surgeon: Joyice Faster. Cornett, MD;  [facility];  Service: General;  Laterality: N/A;   PORTACATH PLACEMENT     REPLACED PAC DUE TO MALFUNCTION (LEFT)   TOTAL KNEE ARTHROPLASTY Right 08/16/2012   Procedure: TOTAL KNEE ARTHROPLASTY;  Surgeon: Kerin Salen, MD;  Location: [place];  Service: Orthopedics;  Laterality: Right;   TOTAL KNEE ARTHROPLASTY Left 07/30/2020   Procedure: LEFT TOTAL KNEE ARTHROPLASTY;  Surgeon: Leandrew Koyanagi, MD;  Location: [place];  Service: Orthopedics;  Laterality: Left;   TRANSTHORACIC ECHOCARDIOGRAM  10-29-2010   NORMAL LVF/ EF 55-60%/ MILD MV REGURG   TUBAL LIGATION  YRS AGO   VAGINAL HYSTERECTOMY  AGE 66   W/ LSO   Social History   Occupational History   Not on file  Tobacco Use   Smoking status: Never   Smokeless tobacco: Never  Vaping Use   Vaping Use: Never used  Substance and Sexual Activity   Alcohol use: No    Alcohol/week: 0.0 standard drinks   Drug use: No    Types: Flunitrazepam   Sexual activity: Not on file    Comment: MENARCHE AGE 65, G4, P4, PARITY AGE 30, HRT 20+ YEARS

## 2020-11-06 ENCOUNTER — Encounter: Payer: Self-pay | Admitting: *Deleted

## 2020-11-12 DIAGNOSIS — C50911 Malignant neoplasm of unspecified site of right female breast: Secondary | ICD-10-CM | POA: Diagnosis not present

## 2020-11-21 ENCOUNTER — Other Ambulatory Visit: Payer: Self-pay | Admitting: Internal Medicine

## 2020-11-21 DIAGNOSIS — E785 Hyperlipidemia, unspecified: Secondary | ICD-10-CM

## 2020-11-28 ENCOUNTER — Ambulatory Visit (HOSPITAL_COMMUNITY)
Admission: EM | Admit: 2020-11-28 | Discharge: 2020-11-28 | Disposition: A | Discharge status: Home / Self Care | Payer: Medicare Other | Attending: Emergency Medicine | Admitting: Emergency Medicine

## 2020-11-28 ENCOUNTER — Encounter (HOSPITAL_COMMUNITY): Payer: Self-pay

## 2020-11-28 ENCOUNTER — Other Ambulatory Visit: Payer: Self-pay

## 2020-11-28 DIAGNOSIS — N3001 Acute cystitis with hematuria: Secondary | ICD-10-CM | POA: Diagnosis not present

## 2020-11-28 LAB — POCT URINALYSIS DIPSTICK, ED / UC
Bilirubin Urine: NEGATIVE
Glucose, UA: >=1000 mg/dL — AB
Ketones, ur: NEGATIVE mg/dL
Nitrite: NEGATIVE
Protein, ur: NEGATIVE mg/dL
Specific Gravity, Urine: 1.02 (ref 1.005–1.030)
Urobilinogen, UA: 0.2 mg/dL (ref 0.0–1.0)
pH: 5.5 (ref 5.0–8.0)

## 2020-11-28 LAB — CBG MONITORING, ED: Glucose-Capillary: 252 mg/dL — ABNORMAL HIGH (ref 70–99)

## 2020-11-28 MED ORDER — CEPHALEXIN 500 MG PO CAPS: 500.0000 mg | ORAL_CAPSULE | Freq: Two times a day (BID) | ORAL | 0 refills | Status: AC

## 2020-11-28 MED ORDER — FLUCONAZOLE 150 MG PO TABS: ORAL_TABLET | ORAL | 0 refills | Status: DC

## 2020-11-28 NOTE — ED Triage Notes (Signed)
Pt presents with burning during urination and blood in urine since yesterday.

## 2020-11-28 NOTE — Discharge Instructions (Addendum)
Take the Keflex twice a day for the next 7 days.  Take the Diflucan one pill today.  You can repeat in 3 days if you are still having symptoms.   You can take Tylenol and/or Ibuprofen as needed for pain relief and fever reduction.   Make sure you are drinking plenty of fluids, especially water.  You can drink cranberry juice to help with symptom relief, but make sure it is cranberry juice and not cranberry cocktail.  You can also try AZO, cranberry pills, or pyridium as needed.    Return or go to the Emergency Department if symptoms worsen or do not improve in the next few days.

## 2020-11-28 NOTE — ED Provider Notes (Signed)
[facility]    CSN: [id number] Arrival date & time: 11/28/20  1040      History   Chief Complaint Chief Complaint  Patient presents with   UTI    HPI [name] is a 75 y.o. female.   Patient here for evaluation of dysuria and hematuria that started yesterday.  Reports drinking cranberry juice with some relief.  Denies any flank or back pain.  Denies any trauma, injury, or other precipitating event.  Denies any specific alleviating or aggravating factors.  Denies any fevers, chest pain, shortness of breath, N/V/D, numbness, tingling, weakness, abdominal pain, or headaches.     The history is provided by the patient.   Past Medical History:  Diagnosis Date   Allergy    Anemia    Breast CA ([place])    (Rt) breast ca dx 4/12---  S/P RADICAL MASTECTOMY AND CHEMORADIATION   Diabetes mellitus [facility]   Fatty liver disease, nonalcoholic    History of hyperkalemia    Hx antineoplastic chemo  10/12 - 06/24/11   PACLITAX EL COMPLETED 06/24/11   Hyperlipidemia    Hypertension    Mild nonproliferative diabetic retinopathy of right eye ([place]) 03/19/11   Dr. Benjaman Pott Poudyal   Numbness and tingling    Hx: of in fingers and toes since chemotherapy   OA (osteoarthritis) of knee    RIGHT LEG   Obesity    Renal lesion     Patient Active Problem List   Diagnosis Date Noted   Nausea 09/24/2020   Status post total left knee replacement 07/30/2020   Fatigue 02/10/2019   Primary osteoarthritis of left knee 01/25/2019   Left carpal tunnel syndrome 01/25/2019   Cervical neuropathy 08/20/2018   Bilateral carpal tunnel syndrome 06/29/2018   Depressed mood 01/21/2018   Need for immunization against influenza 01/17/2018   Osteopenia 12/13/2013   Vaginal dryness 12/13/2013   Peripheral neuropathy 11/02/2013   Health care maintenance 11/17/2012   Arthritis of knee, right 08/16/2012   Breast cancer, right breast ([place]) 09/25/2010   COLONIC POLYPS, HX OF 07/19/2007   OBESITY  NOS 07/09/2006   Type 2 diabetes mellitus with renal manifestations ([place]) 03/10/2006   Hyperlipidemia 03/10/2006   Hypertension associated with diabetes ([place]) 03/10/2006   ALLERGIC RHINITIS 03/10/2006   Fatty liver 03/10/2006    Past Surgical History:  Procedure Laterality Date   COLONOSCOPY W/ POLYPECTOMY     Hx; of   I & D KNEE WITH POLY EXCHANGE Right 09/03/2012   Procedure: IRRIGATION AND DEBRIDEMENT RIGHT KNEE WITH POLY EXCHANGE;  Surgeon: Kerin Salen, MD;  Location: [facility];  Service: Orthopedics;  Laterality: Right;   JOINT REPLACEMENT     knee arthroscopy  10-28-1999   RIGHT   Laparoscopy with laparoscopic right salpingo oophorectomy and lysis of pelvic and abdominal adhesions  March 2013   Dr. Nori Riis    LEFT TOTAL KNEE ARTHROPLASTY (Left Knee)  07/30/2020   MASTECTOMY MODIFIED RADICAL  12-04-2010   W/ LEFT PAC PLACEMENT (RIGHT BREAST W/ AXILLARY CONTENTS/ NODE BX'S   PORT-A-CATH REMOVAL  09/24/2011   Procedure: REMOVAL PORT-A-CATH;  Surgeon: Joyice Faster. Cornett, MD;  Location: [facility];  Service: General;  Laterality: N/A;   PORTACATH PLACEMENT     REPLACED PAC DUE TO MALFUNCTION (LEFT)   TOTAL KNEE ARTHROPLASTY Right 08/16/2012   Procedure: TOTAL KNEE ARTHROPLASTY;  Surgeon: Kerin Salen, MD;  Location: [place];  Service: Orthopedics;  Laterality: Right;   TOTAL KNEE ARTHROPLASTY  Left 07/30/2020   Procedure: LEFT TOTAL KNEE ARTHROPLASTY;  Surgeon: Leandrew Koyanagi, MD;  Location: [facility];  Service: Orthopedics;  Laterality: Left;   TRANSTHORACIC ECHOCARDIOGRAM  10-29-2010   NORMAL LVF/ EF 55-60%/ MILD MV REGURG   TUBAL LIGATION  YRS AGO   VAGINAL HYSTERECTOMY  AGE 4   W/ LSO    OB History     Gravida  4   Para  4   Term  4   Preterm      AB      Living  4      SAB      IAB      Ectopic      Multiple      Live Births               Home Medications    Prior to Admission medications   Medication Sig Start Date End Date Taking? Authorizing Provider   cephALEXin (KEFLEX) 500 MG capsule Take 1 capsule (500 mg total) by mouth 2 (two) times daily for 7 days. 11/28/20 12/05/20 Yes Pearson Forster, NP  fluconazole (DIFLUCAN) 150 MG tablet Take one pill today.  Take the second pill in 3 days if you are are still having symptoms. 11/28/20  Yes Pearson Forster, NP  Accu-Chek Softclix Lancets lancets Check blood sugar up to three times daily. 05/12/19   Ina Homes, MD  aspirin EC 81 MG tablet Take 1 tablet (81 mg total) by mouth 2 (two) times daily. To be taken after surgery 07/26/20   Aundra Dubin, PA-C  atorvastatin (LIPITOR) 40 MG tablet TAKE 1 TABLET BY MOUTH EVERYDAY AT BEDTIME 11/22/20   [name], Vasilios, MD  chlorthalidone (HYGROTON) 25 MG tablet TAKE 1 TABLET BY MOUTH EVERY DAY 09/03/20   Gaylan Gerold, DO  docusate sodium (COLACE) 100 MG capsule Take 1 capsule (100 mg total) by mouth daily as needed. 07/26/20 07/26/21  Aundra Dubin, PA-C  empagliflozin (JARDIANCE) 10 MG TABS tablet Take 1 tablet (10 mg total) by mouth daily. 09/27/20   [name], Vasilios, MD  fluticasone (FLONASE) 50 MCG/ACT nasal spray Place 2 sprays into both nostrils daily. Patient taking differently: Place 2 sprays into both nostrils daily as needed for allergies or rhinitis (allergies.). 06/21/18 08/07/21  Santos-Sanchez, Merlene Morse, MD  glucose blood (ACCU-CHEK GUIDE) test strip Check blood sugar up to three times daily. 05/12/19   Helberg, Larkin Ina, MD  lisinopril (ZESTRIL) 20 MG tablet TAKE 1 TABLET BY MOUTH EVERY DAY Patient taking differently: Take 20 mg by mouth daily. 05/10/20   Jeralyn Bennett, MD  methocarbamol (ROBAXIN) 500 MG tablet Take 1 tablet (500 mg total) by mouth 2 (two) times daily as needed. To be taken after surgery 07/26/20   Aundra Dubin, PA-C  ondansetron (ZOFRAN) 4 MG tablet Take 1 tablet (4 mg total) by mouth every 8 (eight) hours as needed for nausea or vomiting. 09/24/20   Riesa Pope, MD  oxyCODONE-acetaminophen (PERCOCET) 5-325 MG tablet  Take 1-2 tablets by mouth every 6 (six) hours as needed. To be taken after surgery 07/31/20   Aundra Dubin, PA-C  Polyethyl Glycol-Propyl Glycol (SYSTANE) 0.4-0.3 % SOLN Place 1 drop into both eyes daily as needed (Dry eyes).    [provider]    Family History Family History  Problem Relation Age of Onset   Stroke Father    Heart disease Sister    Cancer Sister        breast   Heart  disease Brother    Cancer Sister        COLON CANCER    Social History Social History   Tobacco Use   Smoking status: Never   Smokeless tobacco: Never  Vaping Use   Vaping Use: Never used  Substance Use Topics   Alcohol use: No    Alcohol/week: 0.0 standard drinks   Drug use: No    Types: Flunitrazepam     Allergies   Hctz [hydrochlorothiazide]   Review of Systems Review of Systems  Genitourinary:  Positive for dysuria, frequency and urgency. Negative for vaginal discharge.  All other systems reviewed and are negative.   Physical Exam Triage Vital Signs ED Triage Vitals  Enc Vitals Group     BP 11/28/20 1150 139/77     Pulse Rate 11/28/20 1150 84     Resp 11/28/20 1150 17     Temp 11/28/20 1150 98.6 F (37 C)     Temp Source 11/28/20 1150 Oral     SpO2 11/28/20 1150 100 %     Weight --      Height --      Head Circumference --      Peak Flow --      Pain Score 11/28/20 1147 5     Pain Loc --      Pain Edu? --      Excl. in [place]? --    No data found.  Updated Vital Signs BP 139/77 (BP Location: Left Arm)   Pulse 84   Temp 98.6 F (37 C) (Oral)   Resp 17   SpO2 100%   Visual Acuity Right Eye Distance:   Left Eye Distance:   Bilateral Distance:    Right Eye Near:   Left Eye Near:    Bilateral Near:     Physical Exam Vitals and nursing note reviewed.  Constitutional:      General: She is not in acute distress.    Appearance: Normal appearance. She is not ill-appearing, toxic-appearing or diaphoretic.  HENT:     Head: Normocephalic and  atraumatic.  Eyes:     Conjunctiva/sclera: Conjunctivae normal.  Cardiovascular:     Rate and Rhythm: Normal rate.     Pulses: Normal pulses.  Pulmonary:     Effort: Pulmonary effort is normal.  Abdominal:     General: Abdomen is flat.     Tenderness: There is no right CVA tenderness or left CVA tenderness.  Musculoskeletal:        General: Normal range of motion.     Cervical back: Normal range of motion.  Skin:    General: Skin is warm and dry.  Neurological:     General: No focal deficit present.     Mental Status: She is alert and oriented to person, place, and time.  Psychiatric:        Mood and Affect: Mood normal.     UC Treatments / Results  Labs (all labs ordered are listed, but only abnormal results are displayed) Labs Reviewed  POCT URINALYSIS DIPSTICK, ED / UC - Abnormal; Notable for the following components:      Result Value   Glucose, UA >=1000 (*)    Hgb urine dipstick TRACE (*)    Leukocytes,Ua TRACE (*)    All other components within normal limits  CBG MONITORING, ED - Abnormal; Notable for the following components:   Glucose-Capillary 252 (*)    All other components within normal limits    EKG  Radiology No results found.  Procedures Procedures (including critical care time)  Medications Ordered in UC Medications - No data to display  Initial Impression / Assessment and Plan / UC Course  I have reviewed the triage vital signs and the nursing notes.  Pertinent labs & imaging results that were available during my care of the patient were reviewed by me and considered in my medical decision making (see chart for details).    Assessment negative for red flags or concerns.  Urinalysis positive for glucose, hgb, and leukocytes.  Urine culture pending.  CBG 252.  Based on symptoms and urinalysis, will treat with keflex for UTI.  If culture is negative, may stop antibiotics.  Discussed conservative symptom management as described in discharge  instructions.  Encouraged fluids.  Patient reports history of yeast infection with antibiotic use and diflucan prescribed.  Follow up with primary care.   Final Clinical Impressions(s) / UC Diagnoses   Final diagnoses:  Acute cystitis with hematuria     Discharge Instructions      Take the Keflex twice a day for the next 7 days.  Take the Diflucan one pill today.  You can repeat in 3 days if you are still having symptoms.   You can take Tylenol and/or Ibuprofen as needed for pain relief and fever reduction.   Make sure you are drinking plenty of fluids, especially water.  You can drink cranberry juice to help with symptom relief, but make sure it is cranberry juice and not cranberry cocktail.  You can also try AZO, cranberry pills, or pyridium as needed.    Return or go to the Emergency Department if symptoms worsen or do not improve in the next few days.     ED Prescriptions     Medication Sig Dispense Auth. Provider   cephALEXin (KEFLEX) 500 MG capsule Take 1 capsule (500 mg total) by mouth 2 (two) times daily for 7 days. 14 capsule Pearson Forster, NP   fluconazole (DIFLUCAN) 150 MG tablet Take one pill today.  Take the second pill in 3 days if you are are still having symptoms. 2 tablet Pearson Forster, NP      PDMP not reviewed this encounter.   Pearson Forster, NP 11/28/20 1253

## 2020-11-30 LAB — URINE CULTURE: Culture: >=100000 — AB

## 2020-12-11 ENCOUNTER — Emergency Department (HOSPITAL_COMMUNITY): Payer: Medicare Other

## 2020-12-11 ENCOUNTER — Other Ambulatory Visit: Payer: Self-pay

## 2020-12-11 ENCOUNTER — Encounter (HOSPITAL_COMMUNITY): Payer: Self-pay | Admitting: Emergency Medicine

## 2020-12-11 ENCOUNTER — Emergency Department (HOSPITAL_COMMUNITY)
Admission: EM | Admit: 2020-12-11 | Discharge: 2020-12-11 | Disposition: A | Discharge status: Home / Self Care | Payer: Medicare Other | Attending: Emergency Medicine | Admitting: Emergency Medicine

## 2020-12-11 DIAGNOSIS — Z79899 Other long term (current) drug therapy: Secondary | ICD-10-CM | POA: Insufficient documentation

## 2020-12-11 DIAGNOSIS — R109 Unspecified abdominal pain: Secondary | ICD-10-CM

## 2020-12-11 DIAGNOSIS — Z96653 Presence of artificial knee joint, bilateral: Secondary | ICD-10-CM | POA: Diagnosis not present

## 2020-12-11 DIAGNOSIS — I700 Atherosclerosis of aorta: Secondary | ICD-10-CM | POA: Diagnosis not present

## 2020-12-11 DIAGNOSIS — K5730 Diverticulosis of large intestine without perforation or abscess without bleeding: Secondary | ICD-10-CM | POA: Diagnosis not present

## 2020-12-11 DIAGNOSIS — Z7982 Long term (current) use of aspirin: Secondary | ICD-10-CM | POA: Diagnosis not present

## 2020-12-11 DIAGNOSIS — E785 Hyperlipidemia, unspecified: Secondary | ICD-10-CM | POA: Insufficient documentation

## 2020-12-11 DIAGNOSIS — N3000 Acute cystitis without hematuria: Secondary | ICD-10-CM | POA: Diagnosis not present

## 2020-12-11 DIAGNOSIS — E1169 Type 2 diabetes mellitus with other specified complication: Secondary | ICD-10-CM | POA: Insufficient documentation

## 2020-12-11 DIAGNOSIS — I10 Essential (primary) hypertension: Secondary | ICD-10-CM | POA: Insufficient documentation

## 2020-12-11 DIAGNOSIS — K760 Fatty (change of) liver, not elsewhere classified: Secondary | ICD-10-CM | POA: Diagnosis not present

## 2020-12-11 DIAGNOSIS — N390 Urinary tract infection, site not specified: Secondary | ICD-10-CM | POA: Diagnosis not present

## 2020-12-11 DIAGNOSIS — Z853 Personal history of malignant neoplasm of breast: Secondary | ICD-10-CM | POA: Insufficient documentation

## 2020-12-11 LAB — COMPREHENSIVE METABOLIC PANEL
ALT: 28 U/L (ref 0–44)
AST: 23 U/L (ref 15–41)
Albumin: 4 g/dL (ref 3.5–5.0)
Alkaline Phosphatase: 55 U/L (ref 38–126)
Anion gap: 7 (ref 5–15)
BUN: 14 mg/dL (ref 8–23)
CO2: 30 mmol/L (ref 22–32)
Calcium: 9.3 mg/dL (ref 8.9–10.3)
Chloride: 105 mmol/L (ref 98–111)
Creatinine, Ser: 0.87 mg/dL (ref 0.44–1.00)
GFR, Estimated: >60 mL/min (ref >60)
Glucose, Bld: 161 mg/dL — ABNORMAL HIGH (ref 70–99)
Potassium: 3.6 mmol/L (ref 3.5–5.1)
Sodium: 142 mmol/L (ref 135–145)
Total Bilirubin: 0.6 mg/dL (ref 0.3–1.2)
Total Protein: 6.4 g/dL — ABNORMAL LOW (ref 6.5–8.1)

## 2020-12-11 LAB — CBC WITH DIFFERENTIAL/PLATELET
Abs Immature Granulocytes: 0.03 10*3/uL (ref 0.00–0.07)
Basophils Absolute: 0 10*3/uL (ref 0.0–0.1)
Basophils Relative: 1 %
Eosinophils Absolute: 0.1 10*3/uL (ref 0.0–0.5)
Eosinophils Relative: 1 %
HCT: 32.5 % — ABNORMAL LOW (ref 36.0–46.0)
Hemoglobin: 10.4 g/dL — ABNORMAL LOW (ref 12.0–15.0)
Immature Granulocytes: 1 %
Lymphocytes Relative: 35 %
Lymphs Abs: 1.9 10*3/uL (ref 0.7–4.0)
MCH: 30.1 pg (ref 26.0–34.0)
MCHC: 32 g/dL (ref 30.0–36.0)
MCV: 93.9 fL (ref 80.0–100.0)
Monocytes Absolute: 0.4 10*3/uL (ref 0.1–1.0)
Monocytes Relative: 7 %
Neutro Abs: 3 10*3/uL (ref 1.7–7.7)
Neutrophils Relative %: 55 %
Platelets: 183 10*3/uL (ref 150–400)
RBC: 3.46 MIL/uL — ABNORMAL LOW (ref 3.87–5.11)
RDW: 17.6 % — ABNORMAL HIGH (ref 11.5–15.5)
WBC: 5.3 10*3/uL (ref 4.0–10.5)
nRBC: 0 % (ref 0.0–0.2)

## 2020-12-11 LAB — URINALYSIS, ROUTINE W REFLEX MICROSCOPIC
Bacteria, UA: NONE SEEN
Bilirubin Urine: NEGATIVE
Glucose, UA: NEGATIVE mg/dL
Hgb urine dipstick: NEGATIVE
Ketones, ur: NEGATIVE mg/dL
Nitrite: NEGATIVE
Protein, ur: NEGATIVE mg/dL
Specific Gravity, Urine: 1.002 — ABNORMAL LOW (ref 1.005–1.030)
pH: 7 (ref 5.0–8.0)

## 2020-12-11 LAB — LIPASE, BLOOD: Lipase: 46 U/L (ref 11–51)

## 2020-12-11 MED ORDER — SULFAMETHOXAZOLE-TRIMETHOPRIM 800-160 MG PO TABS: 1.0000 | ORAL_TABLET | Freq: Two times a day (BID) | ORAL | 0 refills | Status: AC

## 2020-12-11 NOTE — Discharge Instructions (Addendum)
Your CT scanning looks good.  Take the antibiotic twice a day for 3 days to treat your UTI.  Follow-up with your primary doctor and your OB/GYN.

## 2020-12-11 NOTE — ED Provider Notes (Signed)
Emergency Medicine Provider Triage Evaluation Note  [name] , a 75 y.o. female  was evaluated in triage.  Pt complains of abdominal "tightness" for past 2 days. Was treated for UTI with keflex 2 weeks ago. Finished abx but feels like it did not get better.  Review of Systems  Positive: Abd pain, dysuria, urinary frequency Negative: Hematochezia/melena, vomitting  Physical Exam  BP (!) 170/76 (BP Location: Left Arm)   Pulse 64   Temp 98.5 F (36.9 C) (Oral)   Resp 16   Ht '4\' 11"'$  (1.499 m)   Wt 70.3 kg   SpO2 100%   BMI 31.31 kg/m  Gen:   Awake, no distress   Resp:  Normal effort  MSK:   Moves extremities without difficulty  Abd:  Tender RLQ and epigastric. No CVA tenderness bilat Other:    Medical Decision Making  Medically screening exam initiated at 6:25 AM.  Appropriate orders placed.  [name] was informed that the remainder of the evaluation will be completed by another provider, this initial triage assessment does not replace that evaluation, and the importance of remaining in the ED until their evaluation is complete.     Rhae Hammock, PA-C 12/11/20 0630    Merryl Hacker, MD 12/11/20 [phone]

## 2020-12-11 NOTE — ED Provider Notes (Signed)
[facility] EMERGENCY DEPARTMENT Provider Note   CSN: [id number] Arrival date & time: 12/11/20  [id number]     History Chief Complaint  Patient presents with   Urinary Symptoms    [name] is a 75 y.o. female.  Patient presenting with persistent urinary frequency and dysuria with abdominal pain.  Patient states she woke up this morning with pain in the right side of her abdomen which is constant and dull, overall improving and currently rated 4 out of 10 in severity.  She was recently seen at urgent care about 2 weeks ago on 7/27 with symptoms of urinary frequency, hematuria, and dysuria and was treated for UTI with cephalexin 500 mg twice daily for 7 days.  Patient states that she has had some improvement in her dysuria with antibiotics but did not completely resolve.  Took some OTC AZO yesterday.  Denies fever, chills, back pain.  No further episodes of hematuria.  The history is provided by the patient. No language interpreter was used.      Past Medical History:  Diagnosis Date   Allergy    Anemia    Breast CA ([place])    (Rt) breast ca dx 4/12---  S/P RADICAL MASTECTOMY AND CHEMORADIATION   Diabetes mellitus ORAL MED   Fatty liver disease, nonalcoholic    History of hyperkalemia    Hx antineoplastic chemo  10/12 - 06/24/11   PACLITAX EL COMPLETED 06/24/11   Hyperlipidemia    Hypertension    Mild nonproliferative diabetic retinopathy of right eye ([place]) 03/19/11   Dr. Benjaman Pott Poudyal   Numbness and tingling    Hx: of in fingers and toes since chemotherapy   OA (osteoarthritis) of knee    RIGHT LEG   Obesity    Renal lesion     Patient Active Problem List   Diagnosis Date Noted   Nausea 09/24/2020   Status post total left knee replacement 07/30/2020   Fatigue 02/10/2019   Primary osteoarthritis of left knee 01/25/2019   Left carpal tunnel syndrome 01/25/2019   Cervical neuropathy 08/20/2018   Bilateral carpal tunnel syndrome 06/29/2018   Depressed  mood 01/21/2018   Need for immunization against influenza 01/17/2018   Osteopenia 12/13/2013   Vaginal dryness 12/13/2013   Peripheral neuropathy 11/02/2013   Health care maintenance 11/17/2012   Arthritis of knee, right 08/16/2012   Breast cancer, right breast ([place]) 09/25/2010   COLONIC POLYPS, HX OF 07/19/2007   OBESITY NOS 07/09/2006   Type 2 diabetes mellitus with renal manifestations ([place]) 03/10/2006   Hyperlipidemia 03/10/2006   Hypertension associated with diabetes ([place]) 03/10/2006   ALLERGIC RHINITIS 03/10/2006   Fatty liver 03/10/2006    Past Surgical History:  Procedure Laterality Date   COLONOSCOPY W/ POLYPECTOMY     Hx; of   I & D KNEE WITH POLY EXCHANGE Right 09/03/2012   Procedure: IRRIGATION AND DEBRIDEMENT RIGHT KNEE WITH POLY EXCHANGE;  Surgeon: Kerin Salen, MD;  Location: [facility];  Service: Orthopedics;  Laterality: Right;   JOINT REPLACEMENT     knee arthroscopy  10-28-1999   RIGHT   Laparoscopy with laparoscopic right salpingo oophorectomy and lysis of pelvic and abdominal adhesions  March 2013   Dr. Nori Riis    LEFT TOTAL KNEE ARTHROPLASTY (Left Knee)  07/30/2020   MASTECTOMY MODIFIED RADICAL  12-04-2010   W/ LEFT PAC PLACEMENT (RIGHT BREAST W/ AXILLARY CONTENTS/ NODE BX'S   PORT-A-CATH REMOVAL  09/24/2011   Procedure: REMOVAL PORT-A-CATH;  Surgeon: Joyice Faster.  Cornett, MD;  Location: [facility];  Service: General;  Laterality: N/A;   PORTACATH PLACEMENT     REPLACED PAC DUE TO MALFUNCTION (LEFT)   TOTAL KNEE ARTHROPLASTY Right 08/16/2012   Procedure: TOTAL KNEE ARTHROPLASTY;  Surgeon: Kerin Salen, MD;  Location: [facility];  Service: Orthopedics;  Laterality: Right;   TOTAL KNEE ARTHROPLASTY Left 07/30/2020   Procedure: LEFT TOTAL KNEE ARTHROPLASTY;  Surgeon: Leandrew Koyanagi, MD;  Location: [place];  Service: Orthopedics;  Laterality: Left;   TRANSTHORACIC ECHOCARDIOGRAM  10-29-2010   NORMAL LVF/ EF 55-60%/ MILD MV REGURG   TUBAL LIGATION  YRS AGO   VAGINAL HYSTERECTOMY  AGE  47   W/ LSO     OB History     Gravida  4   Para  4   Term  4   Preterm      AB      Living  4      SAB      IAB      Ectopic      Multiple      Live Births              Family History  Problem Relation Age of Onset   Stroke Father    Heart disease Sister    Cancer Sister        breast   Heart disease Brother    Cancer Sister        COLON CANCER    Social History   Tobacco Use   Smoking status: Never   Smokeless tobacco: Never  Vaping Use   Vaping Use: Never used  Substance Use Topics   Alcohol use: No    Alcohol/week: 0.0 standard drinks   Drug use: No    Types: Flunitrazepam    Home Medications Prior to Admission medications   Medication Sig Start Date End Date Taking? Authorizing Provider  sulfamethoxazole-trimethoprim (BACTRIM DS) 800-160 MG tablet Take 1 tablet by mouth 2 (two) times daily for 3 days. 12/11/20 12/14/20 Yes Zola Button, MD  Accu-Chek Softclix Lancets lancets Check blood sugar up to three times daily. 05/12/19   Ina Homes, MD  aspirin EC 81 MG tablet Take 1 tablet (81 mg total) by mouth 2 (two) times daily. To be taken after surgery 07/26/20   Aundra Dubin, PA-C  atorvastatin (LIPITOR) 40 MG tablet TAKE 1 TABLET BY MOUTH EVERYDAY AT BEDTIME 11/22/20   [name], Vasilios, MD  chlorthalidone (HYGROTON) 25 MG tablet TAKE 1 TABLET BY MOUTH EVERY DAY 09/03/20   Gaylan Gerold, DO  docusate sodium (COLACE) 100 MG capsule Take 1 capsule (100 mg total) by mouth daily as needed. 07/26/20 07/26/21  Aundra Dubin, PA-C  empagliflozin (JARDIANCE) 10 MG TABS tablet Take 1 tablet (10 mg total) by mouth daily. 09/27/20   [name], Vasilios, MD  fluconazole (DIFLUCAN) 150 MG tablet Take one pill today.  Take the second pill in 3 days if you are are still having symptoms. 11/28/20   Pearson Forster, NP  fluticasone (FLONASE) 50 MCG/ACT nasal spray Place 2 sprays into both nostrils daily. Patient taking differently: Place 2 sprays into  both nostrils daily as needed for allergies or rhinitis (allergies.). 06/21/18 08/07/21  Santos-Sanchez, Merlene Morse, MD  glucose blood (ACCU-CHEK GUIDE) test strip Check blood sugar up to three times daily. 05/12/19   Helberg, Larkin Ina, MD  lisinopril (ZESTRIL) 20 MG tablet TAKE 1 TABLET BY MOUTH EVERY DAY Patient taking differently: Take 20 mg by mouth daily.  05/10/20   Jeralyn Bennett, MD  methocarbamol (ROBAXIN) 500 MG tablet Take 1 tablet (500 mg total) by mouth 2 (two) times daily as needed. To be taken after surgery 07/26/20   Aundra Dubin, PA-C  ondansetron (ZOFRAN) 4 MG tablet Take 1 tablet (4 mg total) by mouth every 8 (eight) hours as needed for nausea or vomiting. 09/24/20   Riesa Pope, MD  oxyCODONE-acetaminophen (PERCOCET) 5-325 MG tablet Take 1-2 tablets by mouth every 6 (six) hours as needed. To be taken after surgery 07/31/20   Aundra Dubin, PA-C  Polyethyl Glycol-Propyl Glycol (SYSTANE) 0.4-0.3 % SOLN Place 1 drop into both eyes daily as needed (Dry eyes).    [provider]    Allergies    Hctz [hydrochlorothiazide]  Review of Systems   Review of Systems  Constitutional:  Negative for fever.  Respiratory:  Negative for shortness of breath.   Cardiovascular:  Negative for chest pain.  Gastrointestinal:  Positive for abdominal pain. Negative for constipation, diarrhea and vomiting.  Genitourinary:  Positive for dysuria and frequency. Negative for flank pain and hematuria.  Musculoskeletal:  Negative for back pain.   Physical Exam Updated Vital Signs BP (!) 180/78   Pulse (!) 58   Temp (!) 97.5 F (36.4 C) (Oral)   Resp 14   Ht '4\' 11"'$  (1.499 m)   Wt 70.3 kg   SpO2 100%   BMI 31.31 kg/m   Physical Exam Vitals and nursing note reviewed.  Constitutional:      General: She is not in acute distress.    Appearance: She is well-developed.  HENT:     Head: Normocephalic and atraumatic.     Mouth/Throat:     Mouth: Mucous membranes are moist.      Pharynx: Oropharynx is clear.  Eyes:     Extraocular Movements: Extraocular movements intact.     Conjunctiva/sclera: Conjunctivae normal.  Cardiovascular:     Rate and Rhythm: Normal rate and regular rhythm.     Heart sounds: No murmur heard. Pulmonary:     Effort: Pulmonary effort is normal. No respiratory distress.     Breath sounds: Normal breath sounds.  Abdominal:     Palpations: Abdomen is soft.     Tenderness: There is no abdominal tenderness. There is no right CVA tenderness or left CVA tenderness.  Musculoskeletal:     Cervical back: Neck supple.  Skin:    General: Skin is warm and dry.  Neurological:     Mental Status: She is alert.    ED Results / Procedures / Treatments   Labs (all labs ordered are listed, but only abnormal results are displayed) Labs Reviewed  URINALYSIS, ROUTINE W REFLEX MICROSCOPIC - Abnormal; Notable for the following components:      Result Value   Color, Urine COLORLESS (*)    Specific Gravity, Urine 1.002 (*)    Leukocytes,Ua TRACE (*)    All other components within normal limits  CBC WITH DIFFERENTIAL/PLATELET - Abnormal; Notable for the following components:   RBC 3.46 (*)    Hemoglobin 10.4 (*)    HCT 32.5 (*)    RDW 17.6 (*)    All other components within normal limits  COMPREHENSIVE METABOLIC PANEL - Abnormal; Notable for the following components:   Glucose, Bld 161 (*)    Total Protein 6.4 (*)    All other components within normal limits  LIPASE, BLOOD    EKG None  Radiology CT Renal Stone Study  Result Date:  12/11/2020 CLINICAL DATA:  Acute flank pain. EXAM: CT ABDOMEN AND PELVIS WITHOUT CONTRAST TECHNIQUE: Multidetector CT imaging of the abdomen and pelvis was performed following the standard protocol without IV contrast. COMPARISON:  Sep 27, 2016. FINDINGS: Lower chest: No acute abnormality. Hepatobiliary: No gallstones or biliary dilatation is noted. Hepatic steatosis is noted. Pancreas: Unremarkable. No pancreatic ductal  dilatation or surrounding inflammatory changes. Spleen: Normal in size without focal abnormality. Adrenals/Urinary Tract: Adrenal glands are unremarkable. Kidneys are normal, without renal calculi, focal lesion, or hydronephrosis. Bladder is unremarkable. Stomach/Bowel: Stomach is within normal limits. Appendix appears normal. No evidence of bowel wall thickening, distention, or inflammatory changes. Diverticulosis of descending and sigmoid colon is noted without inflammation. Vascular/Lymphatic: Aortic atherosclerosis. No enlarged abdominal or pelvic lymph nodes. Reproductive: Status post hysterectomy. No adnexal masses. Other: No abdominal wall hernia or abnormality. No abdominopelvic ascites. Musculoskeletal: No acute or significant osseous findings. IMPRESSION: Hepatic steatosis. Diverticulosis of descending and sigmoid colon without inflammation. No acute abnormality seen in the abdomen or pelvis. Aortic Atherosclerosis (ICD10-I70.0). Electronically Signed   By: Marijo Conception M.D.   On: 12/11/2020 10:26    Procedures Procedures   Medications Ordered in ED Medications - No data to display  ED Course  I have reviewed the triage vital signs and the nursing notes.  Pertinent labs & imaging results that were available during my care of the patient were reviewed by me and considered in my medical decision making (see chart for details).    MDM Rules/Calculators/A&P                         75 year old female with history of T2DM, HTN, breast cancer in remission presenting with persistent urinary symptoms s/p UTI treatment with cephalexin and abdominal pain.  VSS, abdominal exam benign.  Urine culture from recent urgent care visit revealing 10,000 colonies of E. coli sensitive to cefazolin though with high MIC of 16.  Repeat UA here with trace leukocytes, negative nitrates which is overall unremarkable though similar to most recent UA collected at urgent care.  Given persistence of symptoms, will  treat with different antibiotic.  Discussed with the ED pharmacist, recommending high-dose TMP-SMX for 3 days.  CT renal stone protocol obtained, unremarkable.  Stable for discharge at this time, patient prescribed antibiotics and to follow-up with PCP and OB/GYN.  Final Clinical Impression(s) / ED Diagnoses Final diagnoses:  Acute cystitis without hematuria  Abdominal pain, unspecified abdominal location    Rx / DC Orders ED Discharge Orders          Ordered    sulfamethoxazole-trimethoprim (BACTRIM DS) 800-160 MG tablet  2 times daily        12/11/20 1039             Zola Button, MD 12/11/20 1045    Lucrezia Starch, MD 12/12/20 [phone]

## 2020-12-11 NOTE — ED Triage Notes (Signed)
Patient from home with burning sensation.  She states that she was seen two weeks ago, was given antibiotics and finished them.  She states that she also had a yeast infection and was diagnosed with a UTI.  She states that she doesn't feel any better, having burning and frequency.

## 2020-12-11 NOTE — ED Notes (Signed)
Patient transported to CT 

## 2021-01-03 ENCOUNTER — Other Ambulatory Visit: Payer: Self-pay | Admitting: Student

## 2021-01-03 DIAGNOSIS — I152 Hypertension secondary to endocrine disorders: Secondary | ICD-10-CM

## 2021-01-03 DIAGNOSIS — E1159 Type 2 diabetes mellitus with other circulatory complications: Secondary | ICD-10-CM

## 2021-01-03 DIAGNOSIS — E1121 Type 2 diabetes mellitus with diabetic nephropathy: Secondary | ICD-10-CM

## 2021-01-07 ENCOUNTER — Other Ambulatory Visit: Payer: Self-pay | Admitting: Student

## 2021-01-07 DIAGNOSIS — I152 Hypertension secondary to endocrine disorders: Secondary | ICD-10-CM

## 2021-01-07 DIAGNOSIS — E1159 Type 2 diabetes mellitus with other circulatory complications: Secondary | ICD-10-CM

## 2021-01-14 ENCOUNTER — Encounter: Payer: Self-pay | Admitting: Pharmacist

## 2021-01-14 ENCOUNTER — Ambulatory Visit (INDEPENDENT_AMBULATORY_CARE_PROVIDER_SITE_OTHER): Payer: Medicare Other | Admitting: Pharmacist

## 2021-01-14 ENCOUNTER — Telehealth: Payer: Medicare Other | Admitting: Pharmacist

## 2021-01-14 DIAGNOSIS — Z0000 Encounter for general adult medical examination without abnormal findings: Secondary | ICD-10-CM | POA: Diagnosis not present

## 2021-01-14 NOTE — Patient Instructions (Addendum)
Things That May Be Affecting Your Health:   Alcohol   Hearing loss   Pain   X Depression   Home Safety   Sexual Health  X Diabetes X Lack of physical activity   Stress    Difficulty with daily activities   Loneliness   Tiredness    Drug use   Medicines   Tobacco use    Falls   Motor Vehicle Safety   Weight    Food choices   Oral Health   Other      YOUR PERSONALIZED HEALTH PLAN : 1. Schedule your next subsequent Medicare Wellness visit in one year 2. Attend all of your regular appointments to address your medical issues 3. Complete the preventative screenings and services 4. I recommend you obtain your Shingles and flu vaccines. 5. We scheduled an appointment with you for next week for a 3 month follow-up. You are due for a foot exam and to have you A1C checked.     Annual Wellness Visit                       Medicare Covered Preventative Screenings and Services   Services & Screenings Men and Women Who How Often Need? Date of Last Service Action  Abdominal Aortic Aneurysm Adults with AAA risk factors Once        Alcohol Misuse and Counseling All Adults Screening once a year if no alcohol misuse. Counseling up to 4 face to face sessions.        Bone Density Measurement  Adults at risk for osteoporosis Once every 2 yrs        Lipid Panel Z13.6 All adults without CV disease Once every 5 yrs        Colorectal Cancer  Stool sample or Colonoscopy All adults 42 and older   Once every year Every 10 years        Depression All Adults Once a year   Today    Diabetes Screening Blood glucose, post glucose load, or GTT Z13.1 All adults at risk Pre-diabetics Once per year Twice per year        Diabetes  Self-Management Training All adults Diabetics 10 hrs first year; 2 hours subsequent years. Requires Copay        Glaucoma Diabetics Family history of glaucoma African Americans 80 yrs + Hispanic Americans 50 yrs + Annually - requires coppay        Hepatitis C Z72.89 or F19.20 High Risk  for HCV Born between 1945 and 1965 Annually Once        HIV Z11.4 All adults based on risk Annually btw ages 41 & 17 regardless of risk Annually > 65 yrs if at increased risk        Lung Cancer Screening Asymptomatic adults aged 28-77 with 30 pack yr history and current smoker OR quit within the last 15 yrs Annually Must have counseling and shared decision making documentation before first screen        Medical Nutrition Therapy Adults with  Diabetes Renal disease Kidney transplant within past 3 yrs 3 hours first year; 2 hours subsequent years        Obesity and Counseling All adults Screening once a year Counseling if BMI 30 or higher   Today    Tobacco Use Counseling Adults who use tobacco  Up to 8 visits in one year        Vaccines Z23 Hepatitis B Influenza  Pneumonia  Adults  Once Once every flu season Two different vaccines separated by one year X Flu, COVID      Next Annual Wellness Visit People with Medicare Every year   Today        [place] Women Who How Often Need  Date of Last Service Action  Mammogram  Z12.31 Women over 57 One baseline ages 95-39. Annually ager 40 yrs+        Pap tests All women Annually if high risk. Every 2 yrs for normal risk women        Screening for cervical cancer with  Pap (Z01.419 nl or Z01.411abnl) & HPV Z11.51 Women aged 25 to 67 Once every 5 yrs        Screening pelvic and breast exams All women Annually if high risk. Every 2 yrs for normal risk women        Sexually Transmitted Diseases Chlamydia Gonorrhea Syphilis All at risk adults Annually for non pregnant females at increased risk                [place] Men Who How Ofter Need  Date of Last Service Action  Prostate Cancer - DRE & PSA Men over 50 Annually.  DRE might require a copay.        Sexually Transmitted Diseases Syphilis All at risk adults Annually for men at increased risk          Diabetes Mellitus and [place] care is an  important part of your health, especially when you have diabetes. Diabetes may cause you to have problems because of poor blood flow (circulation) to your feet and legs, which can cause your skin to: Become thinner and drier. Break more easily. Heal more slowly. Peel and crack. You may also have nerve damage (neuropathy) in your legs and feet, causing decreased feeling in them. This means that you may not notice minor injuries to your feet that could lead to more serious problems. Noticing and addressing any potential problems early is the best way to prevent future foot problems. How to care for your feet Foot hygiene  Wash your feet daily with warm water and mild soap. Do not use hot water. Then, pat your feet and the areas between your toes until they are completely dry. Do not soak your feet as this can dry your skin. Trim your toenails straight across. Do not dig under them or around the cuticle. File the edges of your nails with an emery board or nail file. Apply a moisturizing lotion or petroleum jelly to the skin on your feet and to dry, brittle toenails. Use lotion that does not contain alcohol and is unscented. Do not apply lotion between your toes. Shoes and socks Wear clean socks or stockings every day. Make sure they are not too tight. Do not wear knee-high stockings since they may decrease blood flow to your legs. Wear shoes that fit properly and have enough cushioning. Always look in your shoes before you put them on to be sure there are no objects inside. To break in new shoes, wear them for just a few hours a day. This prevents injuries on your feet. Wounds, scrapes, corns, and calluses  Check your feet daily for blisters, cuts, bruises, sores, and redness. If you cannot see the bottom of your feet, use a mirror or ask someone for help. Do not cut corns or calluses or try to remove them with medicine. If you find a minor scrape, cut, or  break in the skin on your feet, keep it and  the skin around it clean and dry. You may clean these areas with mild soap and water. Do not clean the area with peroxide, alcohol, or iodine. If you have a wound, scrape, corn, or callus on your foot, look at it several times a day to make sure it is healing and not infected. Check for: Redness, swelling, or pain. Fluid or blood. Warmth. Pus or a bad smell. General tips Do not cross your legs. This may decrease blood flow to your feet. Do not use heating pads or hot water bottles on your feet. They may burn your skin. If you have lost feeling in your feet or legs, you may not know this is happening until it is too late. Protect your feet from hot and cold by wearing shoes, such as at the beach or on hot pavement. Schedule a complete foot exam at least once a year (annually) or more often if you have foot problems. Report any cuts, sores, or bruises to your health care provider immediately. Where to find more information American Diabetes Association: www.diabetes.org Association of Diabetes Care & Education Specialists: www.diabeteseducator.org Contact a health care provider if: You have a medical condition that increases your risk of infection and you have any cuts, sores, or bruises on your feet. You have an injury that is not healing. You have redness on your legs or feet. You feel burning or tingling in your legs or feet. You have pain or cramps in your legs and feet. Your legs or feet are numb. Your feet always feel cold. You have pain around any toenails. Get help right away if: You have a wound, scrape, corn, or callus on your foot and: You have pain, swelling, or redness that gets worse. You have fluid or blood coming from the wound, scrape, corn, or callus. Your wound, scrape, corn, or callus feels warm to the touch. You have pus or a bad smell coming from the wound, scrape, corn, or callus. You have a fever. You have a red line going up your leg. Summary Check your feet  every day for blisters, cuts, bruises, sores, and redness. Apply a moisturizing lotion or petroleum jelly to the skin on your feet and to dry, brittle toenails. Wear shoes that fit properly and have enough cushioning. If you have foot problems, report any cuts, sores, or bruises to your health care provider immediately. Schedule a complete foot exam at least once a year (annually) or more often if you have foot problems. This information is not intended to replace advice given to you by your health care provider. Make sure you discuss any questions you have with your health care provider. Document Revised: 11/10/2019 Document Reviewed: 11/10/2019 Elsevier Patient Education  2022 [place] for Type 2 Diabetes A screening test for type 2 diabetes (type 2 diabetes mellitus) is a blood test to measure your blood sugar (glucose) level. This test is done to check for early signs of diabetes, before you develop symptoms.  Type 2 diabetes is a long-term (chronic) disease. In type 2 diabetes, one or both of these problems may be present: The pancreas does not make enough of a hormone called insulin. Cells in the body do not respond properly to insulin that the body makes (insulin resistance). Normally, insulin allows blood sugar (glucose) to enter cells in the body. The cells use glucose for energy. Insulin resistance or lack of insulin causes excess glucose to  build up in the blood instead of going into cells. This results in high blood glucose levels (hyperglycemia), which can cause many complications. You may be screened for type 2 diabetes as part of your regular health care, especially if you have a high risk for diabetes. Screening can help to identify type 2 diabetes at its early stage (prediabetes). Identifying and treating prediabetes may delay or prevent the development of type 2 diabetes. Tell a health care provider about: All medicines you are taking, including vitamins, herbs,  eye drops, creams, and over-the-counter medicines. Any bleeding problems you have. Any medical conditions you have. Whether you are pregnant or may be pregnant. Who should be screened for type 2 diabetes? Adults Adults age 74 and older. These adults should be screened once every three years. Adults who are any age, are overweight, and have one other risk factor. These adults should be screened once every three years. Adults who have normal blood glucose levels and two or more risk factors. These adults may be screened once every year (annually). Women who have had gestational diabetes in the past. These women should be screened once every three years. Pregnant women who have risk factors. These women should be screened at their first prenatal visit and again between weeks 24 and 28 of pregnancy. Children and adolescents Children and adolescents should be screened for type 2 diabetes if they are overweight and have any of the following risk factors: A family history of type 2 diabetes. Being a member of a high-risk ethnic group. Signs of insulin resistance or conditions that are associated with insulin resistance. A mother who had gestational diabetes while pregnant. Screening should be done at least once every three years, starting at age 5 or at the onset of puberty, whichever comes first. Your health care provider or your child's health care provider may recommend having a screening more or less often. What are the risk factors for type 2 diabetes? The following are factors that may make you more likely to develop type 2 diabetes and can be modified: Not getting enough exercise. Having high blood pressure. Having low levels of good cholesterol (HDL-C) or high levels of blood fats (triglycerides). Having high blood glucose in a previous blood test. Being overweight or obese. The following are factors that may make you more likely to develop type 2 diabetes and can not be modified: Having  a parent or sibling (first-degree relative) who has diabetes. Being of American-Indian, African-American, Hispanic/Latino, Asian, or [place] descent. Being older than age 41. Having a history of diabetes during pregnancy (gestational diabetes). Having certain diseases or conditions that may be caused by insulin resistance, including: Acanthosis nigricans. This is a condition that causes dark skin on the neck, armpits, and groin. Polycystic ovary syndrome (PCOS). Cardiovascular heart disease. What happens during screening? During screening, your health care provider may ask questions about: Your health and your risk factors, including your activity level and any medical conditions that you have. The health of your first-degree relatives. Past pregnancies, if this applies. Your health care provider will also do a physical exam, including a blood pressure measurement and blood tests. There are four blood tests that can be used to screen for type 2 diabetes. You may have one or more of the following: A fasting blood glucose (FBG) test. You will not be allowed to eat (you will fast) for 8 hours or more before a blood sample is taken. A random blood glucose test. This test checks your blood  glucose at any time of the day regardless of when you ate. An oral glucose tolerance test (OGTT). This test measures your blood glucose at two times: After you have not eaten (have fasted) overnight. This is your baseline glucose level. Two hours after you drink a glucose-containing beverage. An A1C (hemoglobin A1C) blood test. This test provides information about blood glucose control over the previous 2-3 months. What do the results mean? Your test results are a measurement of how much glucose is in your blood. Normal blood glucose levels mean that you do not have diabetes or prediabetes. High blood glucose levels may mean that you have prediabetes or diabetes. Depending on the results, other tests may  be needed to confirm the diagnosis. You may be diagnosed with type 2 diabetes if: Your FBG level is 126 mg/dL (7.0 mmol/L) or higher. Your random blood glucose level is 200 mg/dL (11.1 mmol/L) or higher. Your A1C level is 6.5% or higher. Your OGTT result is higher than 200 mg/dL (11.1 mmol/L). These blood tests may be repeated to confirm your diagnosis. Talk with your health care provider about what your results mean. Summary A screening test for type 2 diabetes (type 2 diabetes mellitus) is a blood test to measure your blood sugar (glucose) level. Know what your risk factors are for developing type 2 diabetes. If you are at risk, get screening tests as often as told by your health care provider. Screening may help you identify type 2 diabetes at its early stage (prediabetes). Identifying and treating prediabetes may delay or prevent the development of type 2 diabetes. This information is not intended to replace advice given to you by your health care provider. Make sure you discuss any questions you have with your health care provider. Document Revised: 07/16/2020 Document Reviewed: 07/16/2020 Elsevier Patient Education  2022 [place] Prevention in the Home, Adult Falls can cause injuries and can happen to people of all ages. There are many things you can do to make your home safe and to help prevent falls. Ask for help when making these changes. What actions can I take to prevent falls? General Instructions Use good lighting in all rooms. Replace any light bulbs that burn out. Turn on the lights in dark areas. Use night-lights. Keep items that you use often in easy-to-reach places. Lower the shelves around your home if needed. Set up your furniture so you have a clear path. Avoid moving your furniture around. Do not have throw rugs or other things on the floor that can make you trip. Avoid walking on wet floors. If any of your floors are uneven, fix them. Add color or  contrast paint or tape to clearly mark and help you see: Grab bars or handrails. First and last steps of staircases. Where the edge of each step is. If you use a stepladder: Make sure that it is fully opened. Do not climb a closed stepladder. Make sure the sides of the stepladder are locked in place. Ask someone to hold the stepladder while you use it. Know where your pets are when moving through your home. What can I do in the bathroom?   Keep the floor dry. Clean up any water on the floor right away. Remove soap buildup in the tub or shower. Use nonskid mats or decals on the floor of the tub or shower. Attach bath mats securely with double-sided, nonslip rug tape. If you need to sit down in the shower, use a plastic, nonslip stool. Install  grab bars by the toilet and in the tub and shower. Do not use towel bars as grab bars. What can I do in the bedroom? Make sure that you have a light by your bed that is easy to reach. Do not use any sheets or blankets for your bed that hang to the floor. Have a firm chair with side arms that you can use for support when you get dressed. What can I do in the kitchen? Clean up any spills right away. If you need to reach something above you, use a step stool with a grab bar. Keep electrical cords out of the way. Do not use floor polish or wax that makes floors slippery. What can I do with my stairs? Do not leave any items on the stairs. Make sure that you have a light switch at the top and the bottom of the stairs. Make sure that there are handrails on both sides of the stairs. Fix handrails that are broken or loose. Install nonslip stair treads on all your stairs. Avoid having throw rugs at the top or bottom of the stairs. Choose a carpet that does not hide the edge of the steps on the stairs. Check carpeting to make sure that it is firmly attached to the stairs. Fix carpet that is loose or worn. What can I do on the outside of my home? Use  bright outdoor lighting. Fix the edges of walkways and driveways and fix any cracks. Remove anything that might make you trip as you walk through a door, such as a raised step or threshold. Trim any bushes or trees on paths to your home. Check to see if handrails are loose or broken and that both sides of all steps have handrails. Install guardrails along the edges of any raised decks and porches. Clear paths of anything that can make you trip, such as tools or rocks. Have leaves, snow, or ice cleared regularly. Use sand or salt on paths during winter. Clean up any spills in your garage right away. This includes grease or oil spills. What other actions can I take? Wear shoes that: Have a low heel. Do not wear high heels. Have rubber bottoms. Feel good on your feet and fit well. Are closed at the toe. Do not wear open-toe sandals. Use tools that help you move around if needed. These include: Canes. Walkers. Scooters. Crutches. Review your medicines with your doctor. Some medicines can make you feel dizzy. This can increase your chance of falling. Ask your doctor what else you can do to help prevent falls. Where to find more information Centers for Disease Control and Prevention, [employer]: http://www.wolf.info/ National Institute on Aging: http://kim-miller.com/ Contact a doctor if: You are afraid of falling at home. You feel weak, drowsy, or dizzy at home. You fall at home. Summary There are many simple things that you can do to make your home safe and to help prevent falls. Ways to make your home safe include removing things that can make you trip and installing grab bars in the bathroom. Ask for help when making these changes in your home. This information is not intended to replace advice given to you by your health care provider. Make sure you discuss any questions you have with your health care provider. Document Revised: 11/23/2019 Document Reviewed: 11/23/2019 Elsevier Patient Education  [place] Maintenance, Female Adopting a healthy lifestyle and getting preventive care are important in promoting health and wellness. Ask your health care provider  about: The right schedule for you to have regular tests and exams. Things you can do on your own to prevent diseases and keep yourself healthy. What should I know about diet, weight, and exercise? Eat a healthy diet  Eat a diet that includes plenty of vegetables, fruits, low-fat dairy products, and lean protein. Do not eat a lot of foods that are high in solid fats, added sugars, or sodium. Maintain a healthy weight Body mass index (BMI) is used to identify weight problems. It estimates body fat based on height and weight. Your health care provider can help determine your BMI and help you achieve or maintain a healthy weight. Get regular exercise Get regular exercise. This is one of the most important things you can do for your health. Most adults should: Exercise for at least 150 minutes each week. The exercise should increase your heart rate and make you sweat (moderate-intensity exercise). Do strengthening exercises at least twice a week. This is in addition to the moderate-intensity exercise. Spend less time sitting. Even light physical activity can be beneficial. Watch cholesterol and blood lipids Have your blood tested for lipids and cholesterol at 73 years of age, then have this test every 5 years. Have your cholesterol levels checked more often if: Your lipid or cholesterol levels are high. You are older than 73 years of age. You are at high risk for heart disease. What should I know about cancer screening? Depending on your health history and family history, you may need to have cancer screening at various ages. This may include screening for: Breast cancer. Cervical cancer. Colorectal cancer. Skin cancer. Lung cancer. What should I know about heart disease, diabetes, and high blood pressure? Blood  pressure and heart disease High blood pressure causes heart disease and increases the risk of stroke. This is more likely to develop in people who have high blood pressure readings, are of African descent, or are overweight. Have your blood pressure checked: Every 3-5 years if you are 14-27 years of age. Every year if you are 70 years old or older. Diabetes Have regular diabetes screenings. This checks your fasting blood sugar level. Have the screening done: Once every three years after age 55 if you are at a normal weight and have a low risk for diabetes. More often and at a younger age if you are overweight or have a high risk for diabetes. What should I know about preventing infection? Hepatitis B If you have a higher risk for hepatitis B, you should be screened for this virus. Talk with your health care provider to find out if you are at risk for hepatitis B infection. Hepatitis C Testing is recommended for: Everyone born from [birth year] through 1965. Anyone with known risk factors for hepatitis C. Sexually transmitted infections (STIs) Get screened for STIs, including gonorrhea and chlamydia, if: You are sexually active and are younger than 73 years of age. You are older than 73 years of age and your health care provider tells you that you are at risk for this type of infection. Your sexual activity has changed since you were last screened, and you are at increased risk for chlamydia or gonorrhea. Ask your health care provider if you are at risk. Ask your health care provider about whether you are at high risk for HIV. Your health care provider may recommend a prescription medicine to help prevent HIV infection. If you choose to take medicine to prevent HIV, you should first get tested for HIV. You should  then be tested every 3 months for as long as you are taking the medicine. Pregnancy If you are about to stop having your period (premenopausal) and you may become pregnant, seek counseling  before you get pregnant. Take 400 to 800 micrograms (mcg) of folic acid every day if you become pregnant. Ask for birth control (contraception) if you want to prevent pregnancy. Osteoporosis and menopause Osteoporosis is a disease in which the bones lose minerals and strength with aging. This can result in bone fractures. If you are 24 years old or older, or if you are at risk for osteoporosis and fractures, ask your health care provider if you should: Be screened for bone loss. Take a calcium or vitamin D supplement to lower your risk of fractures. Be given hormone replacement therapy (HRT) to treat symptoms of menopause. Follow these instructions at home: Lifestyle Do not use any products that contain nicotine or tobacco, such as cigarettes, e-cigarettes, and chewing tobacco. If you need help quitting, ask your health care provider. Do not use street drugs. Do not share needles. Ask your health care provider for help if you need support or information about quitting drugs. Alcohol use Do not drink alcohol if: Your health care provider tells you not to drink. You are pregnant, may be pregnant, or are planning to become pregnant. If you drink alcohol: Limit how much you use to 0-1 drink a day. Limit intake if you are breastfeeding. Be aware of how much alcohol is in your drink. In the [place] one drink equals one 12 oz bottle of beer (355 mL), one 5 oz glass of wine (148 mL), or one 1 oz glass of hard liquor (44 mL). General instructions Schedule regular health, dental, and eye exams. Stay current with your vaccines. Tell your health care provider if: You often feel depressed. You have ever been abused or do not feel safe at home. Summary Adopting a healthy lifestyle and getting preventive care are important in promoting health and wellness. Follow your health care provider's instructions about healthy diet, exercising, and getting tested or screened for diseases. Follow your health  care provider's instructions on monitoring your cholesterol and blood pressure. This information is not intended to replace advice given to you by your health care provider. Make sure you discuss any questions you have with your health care provider. Document Revised: 06/29/2020 Document Reviewed: 04/14/2018 Elsevier Patient Education  2022 [employer].

## 2021-01-14 NOTE — Progress Notes (Signed)
This AWV is being conducted by [place] only. The patient was located at home and I was located in [facility] The patient's identity was confirmed using their DOB and current address. The patient or his/her legal guardian has consented to being evaluated through a telephone encounter and understands the associated risks (an examination cannot be done and the patient may need to come in for an appointment) / benefits (allows the patient to remain at home, decreasing exposure to coronavirus). I personally spent 32 minutes conducting the AWV.  Subjective:   [name] is a 75 y.o. female who presents for a Medicare Annual Wellness Visit.  The following items have been reviewed and updated today in the appropriate area in the EMR.   Health Risk Assessment  Height, weight, BMI, and BP Visual acuity if needed Depression screen Fall risk / safety level Advance directive discussion Medical and family history were reviewed and updated Updating list of other providers & suppliers Medication reconciliation, including over the counter medicines Cognitive screen Written screening schedule Risk Factor list Personalized health advice, risky behaviors, and treatment advice  Social History   Social History Narrative   Current Social History 01/14/2021        Patient lives with spouse in a home is 2 stories. There are 2 steps up to the entrance the patient uses with railings.      Patient's method of transportation is personal car.      The highest level of education was high school diploma.      The patient currently retired.      Identified important Relationships are "my family"      Pets : 1       Interests / Fun: "[employer]"       Current Stressors: "bills"      Religious / Personal Beliefs: Baptist        Cardiac Risk Factors include: advanced age (>59mn, >>80women);diabetes mellitus;hypertension;sedentary lifestyle;obesity (BMI >30kg/m2)    Objective:    Vitals:  There were no vitals taken for this visit. Vitals are unable to obtained due to [id number] health emergency  Activities of Daily Living In your present state of health, do you have any difficulty performing the following activities: 01/14/2021 09/24/2020  Hearing? N N  Vision? Y N  Difficulty concentrating or making decisions? N N  Walking or climbing stairs? N Y  Comment - AT TIMES DUE TO LEFT KNEE SURGERY.  Dressing or bathing? N N  Doing errands, shopping? N N  Preparing Food and eating ? N -  Using the Toilet? N -  In the past six months, have you accidently leaked urine? N -  Do you have problems with loss of bowel control? N -  Managing your Medications? N -  Managing your Finances? N -  Housekeeping or managing your Housekeeping? N -  Some recent data might be hidden    Goals  Goals      Blood Pressure < 140/80     Exercise 3x per week (30 min per time)     HEMOGLOBIN A1C < 7.0     LDL CALC < 100        Fall Risk Fall Risk  01/14/2021 09/24/2020 03/20/2020 12/12/2019 10/27/2019  Falls in the past year? 0 0 0 0 0  Comment - - - - -  Number falls in past yr: 0 - - - 0  Injury with Fall? 0 - - - 0  Risk  Factor Category  - - - - -  Risk for fall due to : No Fall Risks - No Fall Risks No Fall Risks -  Risk for fall due to: Comment - - - - -  Follow up Falls evaluation completed;Falls prevention discussed - Falls prevention discussed Falls prevention discussed -    Depression Screen PHQ 2/9 Scores 09/24/2020 06/20/2020 03/20/2020 12/12/2019  PHQ - 2 Score 0 0 0 0  PHQ- 9 Score - 0 0 2     Cognitive Testing Six-Item Cognitive Screener   "I would like to ask you some questions that ask you to use your memory. I am going to name three objects. Please wait until I say all three words, then repeat them. Remember what they are  because I am going to ask you to name them again in a few minutes. Please repeat these words for me: APPLE--TABLE--PENNY." (Interviewer may repeat  names 3 times if necessary but repetition not scored.)  Did patient correctly repeat all three words? Yes - may proceed with screen  What year is this? Correct What month is this? Correct What day of the week is this? Correct  What were the three objects I asked you to remember? Apple Correct Table Correct Penny Incorrect  Score one point for each incorrect answer.  A score of 2 or more points warrants additional investigation.  Patient's score 1     Assessment and Plan:    During the course of the visit the patient was educated and counseled about appropriate screening and preventive services as documented in the assessment and plan.  Recommended patient receive Shingles and flu vaccine.  Patient due for foot exam and A1C check. Scheduled with provider team for appointment next week.  The printed AVS was given to the patient and included an updated screening schedule, a list of risk factors, and personalized health advice.      Hughes Better, RPH-CPP  01/14/2021

## 2021-01-15 NOTE — Progress Notes (Signed)
I discussed the AWV findings with the provider who conducted the visit. I was present in the office suite and immediately available to provide assistance and direction throughout the time the service was provided.  ?

## 2021-01-16 NOTE — Progress Notes (Signed)
Internal Medicine Clinic Attending  Case discussed with Dr. Lisabeth Devoid soon after the resident saw the patient.  I reviewed the AWV findings.  I agree with the assessment, diagnosis, and plan of care documented in the AWV note.

## 2021-01-21 DIAGNOSIS — H524 Presbyopia: Secondary | ICD-10-CM | POA: Diagnosis not present

## 2021-01-21 DIAGNOSIS — E113293 Type 2 diabetes mellitus with mild nonproliferative diabetic retinopathy without macular edema, bilateral: Secondary | ICD-10-CM | POA: Diagnosis not present

## 2021-01-21 DIAGNOSIS — H25813 Combined forms of age-related cataract, bilateral: Secondary | ICD-10-CM | POA: Diagnosis not present

## 2021-01-21 DIAGNOSIS — H5203 Hypermetropia, bilateral: Secondary | ICD-10-CM | POA: Diagnosis not present

## 2021-01-24 ENCOUNTER — Ambulatory Visit (INDEPENDENT_AMBULATORY_CARE_PROVIDER_SITE_OTHER): Payer: Medicare Other | Admitting: Internal Medicine

## 2021-01-24 ENCOUNTER — Encounter: Payer: Self-pay | Admitting: Internal Medicine

## 2021-01-24 ENCOUNTER — Other Ambulatory Visit: Payer: Self-pay

## 2021-01-24 ENCOUNTER — Other Ambulatory Visit: Payer: Self-pay | Admitting: Internal Medicine

## 2021-01-24 VITALS — BP 187/80 | HR 85 | Temp 98.5°F | Resp 24 | Ht 59.0 in | Wt 150.6 lb

## 2021-01-24 DIAGNOSIS — Z0000 Encounter for general adult medical examination without abnormal findings: Secondary | ICD-10-CM

## 2021-01-24 DIAGNOSIS — E1159 Type 2 diabetes mellitus with other circulatory complications: Secondary | ICD-10-CM

## 2021-01-24 DIAGNOSIS — I152 Hypertension secondary to endocrine disorders: Secondary | ICD-10-CM

## 2021-01-24 DIAGNOSIS — E1121 Type 2 diabetes mellitus with diabetic nephropathy: Secondary | ICD-10-CM | POA: Diagnosis not present

## 2021-01-24 DIAGNOSIS — Z23 Encounter for immunization: Secondary | ICD-10-CM | POA: Diagnosis not present

## 2021-01-24 LAB — POCT GLYCOSYLATED HEMOGLOBIN (HGB A1C): Hemoglobin A1C: 7.9 % — AB (ref 4.0–5.6)

## 2021-01-24 LAB — GLUCOSE, CAPILLARY: Glucose-Capillary: 153 mg/dL — ABNORMAL HIGH (ref 70–99)

## 2021-01-24 MED ORDER — CHLORTHALIDONE 25 MG PO TABS: 25.0000 mg | ORAL_TABLET | Freq: Every day | ORAL | 1 refills | Status: DC

## 2021-01-24 MED ORDER — CANAGLIFLOZIN 100 MG PO TABS: 100.0000 mg | ORAL_TABLET | Freq: Every day | ORAL | 3 refills | Status: DC

## 2021-01-24 NOTE — Progress Notes (Signed)
   CC: diabetes follow up   HPI:  [name] is a 75 y.o. person with HTN, T2DM, NAFLD, and HLD who presents to the [facility] for a 3 month follow up of her diabetes. Please see problem-based list for further details, assessments, and plans.   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 Joseph Art   Numbness and tingling    Hx: of in fingers and toes since chemotherapy   OA (osteoarthritis) of knee    RIGHT LEG   Obesity    Renal lesion    Review of Systems:  Review of Systems  Constitutional:  Negative for chills and fever.  HENT:  Negative for congestion and sore throat.   Eyes:  Negative for blurred vision and double vision.  Respiratory:  Negative for cough and shortness of breath.   Cardiovascular:  Negative for chest pain, palpitations and leg swelling.  Gastrointestinal:  Negative for diarrhea, nausea and vomiting.  Genitourinary:  Negative for dysuria and frequency.  Musculoskeletal: Negative.   Neurological:  Negative for dizziness and headaches.    Physical Exam:  Vitals:   01/24/21 0949  BP: (!) 174/96  Pulse: 82  Resp: (!) 24  Temp: 98.5 F (36.9 C)  TempSrc: Oral  SpO2: 100%  Weight: 150 lb 9.6 oz (68.3 kg)  Height: 4\' 11"  (1.499 m)   General: No acute distress. Head: Normocephalic. Atraumatic. CV: RRR. No murmurs, rubs, or gallops. No LE edema Pulmonary: Lungs CTAB. Normal effort. No wheezing or rales. Abdominal: Soft, nontender, nondistended. Normal bowel sounds. Extremities: Palpable radial and DP pulses. Normal ROM. Skin: Warm and dry. No obvious rash  or lesions. Neuro: A&Ox3. Moves all extremities. Normal sensation. No focal deficit. Psych: Normal mood and affect   Assessment & Plan:   See Encounters Tab for problem based charting.  Patient seen with Dr. Evette Doffing

## 2021-01-24 NOTE — Assessment & Plan Note (Signed)
Diabetic foot exam performed today Flu shot given today Shingrix vaccine sent to pharmacy

## 2021-01-24 NOTE — Patient Instructions (Signed)
Thank you, Ms.Yvonna A Cegielski for allowing [place] to provide your care today. Today we discussed: High blood pressure: Please restart your chlorthalidone and lisinopril. Be sure to take these medicines everyday and we will recheck your BP in 1 month. If you develop any headaches, chest pain, or palpitations, please go to the nearest emergency department Diabetes: Sent in [place] for you to take every day. If this medicine is too expensive, please call us and we will try another medication.  Cholesterol: Continue atorvastatin everyday Healthcare maintenance: Flu shot given today  I have ordered the following labs for you:   Lab Orders         Glucose, capillary         POC Hbg A1C       Referrals ordered today:   Referral Orders  No referral(s) requested today     I have ordered the following medication/changed the following medications:   Stop the following medications: Medications Discontinued During This Encounter  Medication Reason   ondansetron (ZOFRAN) 4 MG tablet Completed Course   empagliflozin (JARDIANCE) 10 MG TABS tablet Cost of medication   chlorthalidone (HYGROTON) 25 MG tablet Reorder     Start the following medications: Meds ordered this encounter  Medications   canagliflozin (INVOKANA) 100 MG TABS tablet    Sig: Take 1 tablet (100 mg total) by mouth daily before breakfast.    Dispense:  30 tablet    Refill:  3   chlorthalidone (HYGROTON) 25 MG tablet    Sig: Take 1 tablet (25 mg total) by mouth daily.    Dispense:  90 tablet    Refill:  1     Follow up:  1 month for blood pressure recheck     Remember: To call us if you cannot get the Invokana so we can start a different diabetes medication  Should you have any questions or concerns please call the internal medicine clinic at [phone].     Buddy Duty, D.O. [place]

## 2021-01-24 NOTE — Assessment & Plan Note (Signed)
BP Readings from Last 3 Encounters:  01/24/21 (!) 187/80  12/11/20 (!) 166/85  11/28/20 139/77   Patient states that she has been off of her chlorthalidone 25 mg and her lisinopril 20 mg, as she was having a lot of diarrhea and was unsure what medication could be causing it. BP is significantly elevated today, although the patient denies any headache, chest pain, palpitations, shortness of breath, or any other sxs at this time. Advised the patient that it was likely the metformin causing her GI sxs, and that she should go back on her chlorthalidone and lisinopril, as her BP is uncontrolled.  Plan: - Restart chlorthalidone 25 mg and lisinopril 20 mg - Follow up BP check in 1 month

## 2021-01-24 NOTE — Assessment & Plan Note (Signed)
Vaccinated against influenza today

## 2021-01-24 NOTE — Assessment & Plan Note (Addendum)
HbA1c 7.9 today from 7.4. The patient states that she has been off of her metformin since April, since it was causing her to have a lot of diarrhea (she was previously on a 500 mg once daily dose). The patient was supposed to start Farxia 5mg  after her last visit, however, this was > $100 and the patient was unable to afford this. Jardiance 10 mg was then sent in, however, this was also unaffordable to the patient, and thus, she has been off of all diabetes medications since her last visit in May. Will try to start a different SGLT-2i today to see if it is more affordable, but if not, I advised the patient to call us back immediately so we can try a different option for her. May need to try a GLP-1 agonist to see if this would be any less expensive than the SGLT-2i or may just need to retry metformin again (metformin XR formulation to try to reduce GI symptoms)   Plan: - Invokana 100 mg sent to pharmacy; if unaffordable, patient is advised to call us asap  - Recheck A1c 3 months, but follow up with meds in 1 month at BP recheck  - Diabetic foot exam performed today

## 2021-01-24 NOTE — Progress Notes (Signed)
Internal Medicine Clinic Attending ° °I saw and evaluated the patient.  I personally confirmed the key portions of the history and exam documented by Dr. Atway and I reviewed pertinent patient test results.  The assessment, diagnosis, and plan were formulated together and I agree with the documentation in the resident’s note.  °

## 2021-02-19 ENCOUNTER — Other Ambulatory Visit: Payer: Self-pay | Admitting: Oncology

## 2021-02-19 DIAGNOSIS — Z1231 Encounter for screening mammogram for malignant neoplasm of breast: Secondary | ICD-10-CM

## 2021-02-19 DIAGNOSIS — Z20822 Contact with and (suspected) exposure to covid-19: Secondary | ICD-10-CM | POA: Diagnosis not present

## 2021-02-21 ENCOUNTER — Ambulatory Visit (INDEPENDENT_AMBULATORY_CARE_PROVIDER_SITE_OTHER): Payer: Medicare Other | Admitting: Internal Medicine

## 2021-02-21 ENCOUNTER — Encounter: Payer: Self-pay | Admitting: Internal Medicine

## 2021-02-21 VITALS — BP 124/66 | HR 99 | Wt 150.5 lb

## 2021-02-21 DIAGNOSIS — E1159 Type 2 diabetes mellitus with other circulatory complications: Secondary | ICD-10-CM

## 2021-02-21 DIAGNOSIS — E785 Hyperlipidemia, unspecified: Secondary | ICD-10-CM | POA: Diagnosis not present

## 2021-02-21 DIAGNOSIS — I152 Hypertension secondary to endocrine disorders: Secondary | ICD-10-CM

## 2021-02-21 DIAGNOSIS — E1122 Type 2 diabetes mellitus with diabetic chronic kidney disease: Secondary | ICD-10-CM

## 2021-02-21 DIAGNOSIS — E1121 Type 2 diabetes mellitus with diabetic nephropathy: Secondary | ICD-10-CM

## 2021-02-21 MED ORDER — GLIPIZIDE 5 MG PO TABS: 2.5000 mg | ORAL_TABLET | Freq: Two times a day (BID) | ORAL | 0 refills | Status: DC

## 2021-02-21 MED ORDER — GLUCOSE BLOOD VI STRP: ORAL_STRIP | 12 refills | Status: DC

## 2021-02-21 MED ORDER — ACCU-CHEK SOFTCLIX LANCETS MISC: 3 refills | Status: DC

## 2021-02-21 NOTE — Assessment & Plan Note (Signed)
Patient currently taking atorvastatin 40 mg daily.  Patient does not have any symptoms at this time.  Last lipid profile was obtained in 2018.    PLAN: Check lipid profile Continue atorvastatin 40mg  daily

## 2021-02-21 NOTE — Patient Instructions (Signed)
Started you on glipizide 2.5 mg; take daily. Check you blood sugars in the morning. This medication has the tendency to decrease blood sugar levels.   Symptoms of hypoglycemia: feeling tired; lightheaded; dizzy; changes in vision; fainting; overall ill feeling  If you ever feel like this, please check your glucose level; if it is low, try to eat a piece of candy or something sweet  If symptoms worsen call 911 or go to the Emergency department  2. I refilled your test strips and lancets

## 2021-02-21 NOTE — Progress Notes (Signed)
CC: BP check; diabetic medication  HPI:  [name] is a 75 y.o. female with a past medical history stated below and presents today for cc listed above. Please see problem based assessment and plan for additional details.  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 Joseph Art   Numbness and tingling    Hx: of in fingers and toes since chemotherapy   OA (osteoarthritis) of knee    RIGHT LEG   Obesity    Renal lesion     Current Outpatient Medications on File Prior to Visit  Medication Sig Dispense Refill   atorvastatin (LIPITOR) 40 MG tablet TAKE 1 TABLET BY MOUTH EVERYDAY AT BEDTIME 90 tablet 1   chlorthalidone (HYGROTON) 25 MG tablet Take 1 tablet (25 mg total) by mouth daily. 90 tablet 1   fluticasone (FLONASE) 50 MCG/ACT nasal spray Place 2 sprays into both nostrils daily. (Patient taking differently: Place 2 sprays into both nostrils daily as needed for allergies or rhinitis (allergies.).) 16 g 0   lisinopril (ZESTRIL) 20 MG tablet Take 1 tablet (20 mg total) by mouth daily. (Patient not taking: Reported on 01/14/2021) 90 tablet 2   Polyethyl Glycol-Propyl Glycol (SYSTANE) 0.4-0.3 % SOLN Place 1 drop into both eyes daily as needed (Dry eyes).     No current facility-administered medications on file prior to visit.    Family History  Problem Relation Age of Onset   Stroke Father    Cancer Sister        breast   Cancer Sister        COLON CANCER   Stroke Sister    Heart disease Brother     Social History   Socioeconomic History   Marital status: Married    Spouse  name: Not on file   Number of children: 4   Years of education: 16   Highest education level: Not on file  Occupational History   Not on file  Tobacco Use   Smoking status: Never   Smokeless tobacco: Never  Vaping Use   Vaping Use: Never used  Substance and Sexual Activity   Alcohol use: No    Alcohol/week: 0.0 standard drinks   Drug use: No    Types: Flunitrazepam   Sexual activity: Not on file    Comment: MENARCHE AGE 36, G4, P4, PARITY AGE 48, HRT 20+ YEARS    Other Topics Concern   Not on file  Social History Narrative   Current Social History 01/14/2021        Patient lives with spouse in a home is 2 stories. There are 2 steps up to the entrance the patient uses with railings.      Patient's method of transportation is personal car.      The highest level of education was high school diploma.      The patient currently retired.      Identified important Relationships are "my family"      Pets : 1  Interests / Fun: "[employer]"       Current Stressors: "bills"      Religious / Personal Beliefs: Baptist       Social Determinants of Health   Financial Resource Strain: Not on file  Food Insecurity: Not on file  Transportation Needs: Not on file  Physical Activity: Not on file  Stress: Not on file  Social Connections: Not on file  Intimate Partner Violence: Not on file    Review of Systems: ROS negative except for what is noted on the assessment and plan.  Vitals:   02/21/21 1027  BP: 124/66  Pulse: 99  SpO2: 100%  Weight: 150 lb 8 oz (68.3 kg)     Physical Exam: Constitutional: well-appearing elderly woman sitting in the chair, in no acute distress HENT: normocephalic atraumatic, mucous membranes moist Eyes: conjunctiva non-erythematous Neck: supple Cardiovascular: regular rate and rhythm, no m/r/g Pulmonary/Chest: normal work of breathing on room air, lungs clear to auscultation bilaterally Abdominal: soft, non-tender, non-distended MSK:  normal bulk and tone Neurological: alert & oriented x 3, 5/5 strength in bilateral upper and lower extremities, normal gait Skin: warm and dry Psych: normal mood   Assessment & Plan:   See Encounters Tab for problem based charting.  Patient seen with Dr. Pecolia Ades, M.D. [facility], PGY-1 Pager: [phone], Phone: [phone] Date 02/21/2021 Time 11:42 AM

## 2021-02-21 NOTE — Assessment & Plan Note (Signed)
Patient's current A1c 7.9% (September 2022).  Patient is currently not on any diabetic medication due to insurance coverage.  Patient was previously prescribed Farxiga, Jardiance, and Invokana which unfortunately were not covered by her insurance.  Patient previously tried metformin and metformin extended release which she did not tolerate well due to experiencing diarrhea.  Upon review of insurance coverage for diabetes medications, patient qualifies for sulfonylureas.  Patient states that she has  taken sulfonylureas the past and tolerated well.  Common side effect of sulfonylureas hyperglycemia, patient was made aware of the side effects and symptoms to watch out for; Patient acknowledges and agrees.  PLAN: Started on glipizide 2.5 daily Refilled lancets and glucose strips Patient was advised to check glucose levels once daily in the morning; if experiencing symptoms of hypoglycemia recheck glucose, if low take something sweet and if symptoms persist call or go to the emergency department.

## 2021-02-21 NOTE — Assessment & Plan Note (Signed)
On prior visit, patient was restarted on chlorthalidone 25 mg daily and lisinopril 20 mg daily for blood pressure control.  Today blood pressure was at goal at 124/66.  Patient states she is compliant with the medication and tolerating it well.  Patient does not have any symptoms at this time.   PLAN: Continue chlorthalidone 25 lisinopril 20 mg daily Recheck BMP

## 2021-02-26 NOTE — Progress Notes (Signed)
Internal Medicine Clinic Attending  I saw and evaluated the patient.  I personally confirmed the key portions of the history and exam documented by Dr. Ariwodo and I reviewed pertinent patient test results.  The assessment, diagnosis, and plan were formulated together and I agree with the documentation in the resident's note.   

## 2021-03-01 LAB — BMP8+ANION GAP
Anion Gap: 18 mmol/L (ref 10.0–18.0)
BUN/Creatinine Ratio: 18 (ref 12–28)
BUN: 15 mg/dL (ref 8–27)
CO2: 24 mmol/L (ref 20–29)
Chloride: 96 mmol/L (ref 96–106)
Creatinine, Ser: 0.85 mg/dL (ref 0.57–1.00)
Glucose: 264 mg/dL — ABNORMAL HIGH (ref 70–99)
Potassium: 3.6 mmol/L (ref 3.5–5.2)
Sodium: 138 mmol/L (ref 134–144)
eGFR: 72 mL/min/{1.73_m2} (ref >59)

## 2021-03-01 LAB — LIPID PANEL
Chol/HDL Ratio: 3.7 ratio (ref 0.0–4.4)
Cholesterol, Total: 132 mg/dL (ref 100–199)
HDL: 36 mg/dL — ABNORMAL LOW (ref >39)
LDL Chol Calc (NIH): 65 mg/dL (ref 0–99)
Triglycerides: 186 mg/dL — ABNORMAL HIGH (ref 0–149)
VLDL Cholesterol Cal: 31 mg/dL (ref 5–40)

## 2021-03-23 ENCOUNTER — Other Ambulatory Visit: Payer: Self-pay | Admitting: Internal Medicine

## 2021-03-26 ENCOUNTER — Ambulatory Visit: Payer: Medicare Other

## 2021-04-01 ENCOUNTER — Ambulatory Visit
Admission: RE | Admit: 2021-04-01 | Discharge: 2021-04-01 | Disposition: A | Discharge status: Home / Self Care | Payer: Medicare Other | Source: Ambulatory Visit | Attending: Oncology | Admitting: Oncology

## 2021-04-01 DIAGNOSIS — Z1231 Encounter for screening mammogram for malignant neoplasm of breast: Secondary | ICD-10-CM

## 2021-04-03 ENCOUNTER — Other Ambulatory Visit: Payer: Self-pay | Admitting: Oncology

## 2021-04-03 DIAGNOSIS — R928 Other abnormal and inconclusive findings on diagnostic imaging of breast: Secondary | ICD-10-CM

## 2021-04-19 ENCOUNTER — Other Ambulatory Visit: Payer: Self-pay | Admitting: Internal Medicine

## 2021-04-19 ENCOUNTER — Other Ambulatory Visit: Payer: Self-pay | Admitting: Student

## 2021-04-19 DIAGNOSIS — E785 Hyperlipidemia, unspecified: Secondary | ICD-10-CM

## 2021-05-08 ENCOUNTER — Other Ambulatory Visit: Payer: Medicare Other

## 2021-06-05 ENCOUNTER — Ambulatory Visit: Payer: Medicare Other

## 2021-06-05 ENCOUNTER — Ambulatory Visit
Admission: RE | Admit: 2021-06-05 | Discharge: 2021-06-05 | Disposition: A | Discharge status: Home / Self Care | Payer: Medicare Other | Source: Ambulatory Visit | Attending: Oncology | Admitting: Oncology

## 2021-06-05 DIAGNOSIS — R922 Inconclusive mammogram: Secondary | ICD-10-CM | POA: Diagnosis not present

## 2021-06-05 DIAGNOSIS — R928 Other abnormal and inconclusive findings on diagnostic imaging of breast: Secondary | ICD-10-CM

## 2021-06-12 ENCOUNTER — Other Ambulatory Visit: Payer: Medicare Other

## 2021-06-18 DIAGNOSIS — I10 Essential (primary) hypertension: Secondary | ICD-10-CM | POA: Diagnosis not present

## 2021-06-18 DIAGNOSIS — J0140 Acute pansinusitis, unspecified: Secondary | ICD-10-CM | POA: Diagnosis not present

## 2021-07-15 DIAGNOSIS — J329 Chronic sinusitis, unspecified: Secondary | ICD-10-CM | POA: Diagnosis not present

## 2021-08-08 ENCOUNTER — Ambulatory Visit: Payer: Self-pay | Admitting: Licensed Clinical Social Worker

## 2021-08-08 ENCOUNTER — Ambulatory Visit (INDEPENDENT_AMBULATORY_CARE_PROVIDER_SITE_OTHER): Payer: Medicare Other | Admitting: Internal Medicine

## 2021-08-08 ENCOUNTER — Other Ambulatory Visit (HOSPITAL_COMMUNITY): Payer: Self-pay

## 2021-08-08 ENCOUNTER — Other Ambulatory Visit: Payer: Self-pay

## 2021-08-08 ENCOUNTER — Encounter: Payer: Self-pay | Admitting: Internal Medicine

## 2021-08-08 VITALS — BP 119/63 | HR 84 | Temp 98.4°F | Ht 59.0 in | Wt 151.5 lb

## 2021-08-08 DIAGNOSIS — E1159 Type 2 diabetes mellitus with other circulatory complications: Secondary | ICD-10-CM | POA: Diagnosis not present

## 2021-08-08 DIAGNOSIS — E1121 Type 2 diabetes mellitus with diabetic nephropathy: Secondary | ICD-10-CM | POA: Diagnosis not present

## 2021-08-08 DIAGNOSIS — I152 Hypertension secondary to endocrine disorders: Secondary | ICD-10-CM

## 2021-08-08 LAB — POCT GLYCOSYLATED HEMOGLOBIN (HGB A1C): Hemoglobin A1C: 11.4 % — AB (ref 4.0–5.6)

## 2021-08-08 LAB — GLUCOSE, CAPILLARY: Glucose-Capillary: 338 mg/dL — ABNORMAL HIGH (ref 70–99)

## 2021-08-08 MED ORDER — EMPAGLIFLOZIN 10 MG PO TABS
10.0000 mg | ORAL_TABLET | Freq: Every day | ORAL | 2 refills | Status: DC
  Filled 2021-08-08: qty 90, 90d supply, fill #0

## 2021-08-08 MED ORDER — GLIPIZIDE 5 MG PO TABS: ORAL_TABLET | ORAL | 2 refills | Status: DC

## 2021-08-08 MED ORDER — EMPAGLIFLOZIN 10 MG PO TABS
10.0000 mg | ORAL_TABLET | Freq: Every day | ORAL | 2 refills | Status: DC
  Filled 2021-08-08: qty 30, 30d supply, fill #0

## 2021-08-08 MED ORDER — GLIPIZIDE 5 MG PO TABS
2.5000 mg | ORAL_TABLET | Freq: Two times a day (BID) | ORAL | 2 refills | Status: DC
  Filled 2021-08-08: qty 90, 90d supply, fill #0

## 2021-08-08 MED ORDER — LIRAGLUTIDE 18 MG/3ML ~~LOC~~ SOPN
PEN_INJECTOR | SUBCUTANEOUS | 0 refills | Status: DC
  Filled 2021-08-08: qty 6.7, 37d supply, fill #0

## 2021-08-08 MED ORDER — LIRAGLUTIDE 18 MG/3ML ~~LOC~~ SOPN
PEN_INJECTOR | SUBCUTANEOUS | 0 refills | Status: DC
  Filled 2021-08-08: qty 6, 30d supply, fill #0

## 2021-08-08 NOTE — Assessment & Plan Note (Signed)
BP Readings from Last 3 Encounters:  ?08/08/21 119/63  ?02/21/21 124/66  ?01/24/21 (!) 187/80  ? ?Patient is currently on chlorthalidone '25mg'$  daily and lisinopril '20mg'$  daily. She reports compliance with medications and denies any side effects at this time. Most recent BMP wnl without renal or electrolyte dysfunction. ? ?Plan: ?Continue current regimen ?Follow up in  3 months; can consider combination pill if BP remains well controlled  ?

## 2021-08-08 NOTE — Assessment & Plan Note (Addendum)
Patient noted to have worsening A1c at this visit 7.9>11.4. She is not currently on any diabetic medication due to insurance coverage and financial constraints. Patient was previously on glipizide '5mg'$  daily; however, has not been taking this either.  ?She was previously prescribed farxiga, jardiance and invokana; however, these were not covered by her insurance. She has not been able to tolerate metformin due to significant GI side effects in the past. ?Discussed starting GLP-1 agonist with patient for which she is agreeable. However, she is unable to afford co-pay at her pharmacy.  ? ?Plan: ?Started Victoza 0.'6mg'$  daily, uptitrate to 1.'2mg'$  daily if tolerating after 7 days ?Start Jardiance '10mg'$  daily; uptitrate to '25mg'$  daily at next visit if tolerating well ?Resume glipizide 2.'5mg'$  bid ?Prescriptions sent to [facility] OP pharmacy under IM program as she does not have Medicare Part D ?Advised to check blood sugars twice daily at home and follow up in 4 weeks for medication adjustments  ?Referral to social work to help with finances ?Referral to ophthalmology for eye exam ? ?

## 2021-08-08 NOTE — Chronic Care Management (AMB) (Signed)
?  Care Management  ? ?Social Work Visit Note ? ?08/08/2021 ?Name: [name] MRN: [id number] DOB: Jul 21, 1945 ? ?[name] is a 76 y.o. year old female who sees Riesa Pope, MD for primary care. The care management team was consulted for assistance with care management and care coordination needs related to [place]  ? ?Patient was given the following information about care management and care coordination services today, agreed to services, and gave verbal consent: 1.care management/care coordination services include personalized support from designated clinical staff supervised by their physician, including individualized plan of care and coordination with other care providers 2. 24/7 contact phone numbers for assistance for urgent and routine care needs. 3. The patient may stop care management/care coordination services at any time by phone call to the office staff. ? ?Engaged with patient face to face for initial visit in response to provider referral for social work chronic care management and care coordination services. ? ?Assessment: Review of patient history, allergies, and health status during evaluation of patient need for care management/care coordination services.   ? ?Interventions:  ?Patient interviewed and appropriate assessments performed ?Collaborated with clinical team regarding patient needs  ?SW seen patient in office with Dr. [name]. Patient in need of Ensures and Groceries. Patient has a limited income of $720. SW gave patient a supply of Ensures and food from pantry.  ?Patient may be eligible for Medicaid. SW informed patient of Medicaid and recommended patient apply for Medicaid and Food Stamps.  ?Patient is experiencing bankruptcy and required to pay $1500 monthly.  ? ? ?SDOH (Social Determinants of Health) assessments performed: Yes ?   ? ?Plan:  ?SW will follow up with patient within 30 days.  ? ?Milus Height, BSW  ?Social Worker ?[facility] Care Management   ?[phone] ?  ? ? ? ? ? ? ? ? ? ? ? ? ? ? ?

## 2021-08-08 NOTE — Patient Instructions (Signed)
Visit Information ? ?Instructions: patient will work with SW to address concerns related to food insecurity and financials. ? ?Patient was given the following information about care management and care coordination services today, agreed to services, and gave verbal consent: 1.care management/care coordination services include personalized support from designated clinical staff supervised by their physician, including individualized plan of care and coordination with other care providers 2. 24/7 contact phone numbers for assistance for urgent and routine care needs. 3. The patient may stop care management/care coordination services at any time by phone call to the office staff. ? ?Patient verbalizes understanding of instructions and care plan provided today and agrees to view in [place]. Active MyChart status confirmed with patient.   ? ?The care management team will reach out to the patient again over the next 30 days.  ? ?Milus Height, BSW  ?Social Worker ?[facility] Care Management  ?[phone] ?  ? ?  ?

## 2021-08-08 NOTE — Progress Notes (Signed)
? ?  CC: diabetes follow up  ? ?HPI: ? ?[name] is a 76 y.o. female with PMHx as stated below presenting for follow up of her diabetes.  Please see problem based charting for complete assessment and plan. ?Patient endorses recent sinus infection treated with antibioticsx2; followed by ENT. Scheduled for CT sinus ? ?Past Medical History:  ?Diagnosis Date  ? Allergy   ? Anemia   ? Breast CA ([place])   ? (Rt) breast ca dx 4/12---  S/P RADICAL MASTECTOMY AND CHEMORADIATION  ? Diabetes mellitus ORAL MED  ? Fatty liver disease, nonalcoholic   ? History of hyperkalemia   ? Hx antineoplastic chemo  10/12 - 06/24/11  ? [place] COMPLETED 06/24/11  ? Hyperlipidemia   ? Hypertension   ? Mild nonproliferative diabetic retinopathy of right eye ([place]) 03/19/11  ? Dr. Joseph Art  ? Numbness and tingling   ? Hx: of in fingers and toes since chemotherapy  ? OA (osteoarthritis) of knee   ? RIGHT LEG  ? Obesity   ? Renal lesion   ? ?Review of Systems:  Negativ except as stated in HPI. ? ?Physical Exam: ? ?Vitals:  ? 08/08/21 0940  ?BP: 119/63  ?Pulse: 84  ?Temp: 98.4 ?F (36.9 ?C)  ?TempSrc: Oral  ?SpO2: 98%  ?Weight: 151 lb 8 oz (68.7 kg)  ?Height: '4\' 11"'$  (1.499 m)  ? ?Physical Exam  ?Constitutional: Appears well-developed and well-nourished. No distress.  ?HENT: Normocephalic and atraumatic, EOMI, conjunctiva normal, moist mucous membranes ?Cardiovascular: Normal rate, regular rhythm, Distal pulses intact ?Respiratory:  Effort is normal on room air ?Musculoskeletal: Normal bulk and tone.  No peripheral edema noted. ?Neurological: Is alert and oriented x4, no apparent focal deficits noted. ?Skin: Warm and dry.   ?Psychiatric: Normal mood and affect.  ? ? ?Assessment & Plan:  ? ?See Encounters Tab for problem based charting. ? ?Patient discussed with Dr.  Cain Sieve ? ?

## 2021-08-08 NOTE — Patient Instructions (Addendum)
Ms [name], ? ?It was a pleasure seeing you in clinic. Today we discussed:  ? ?Diabetes:  ?At this time, please START Victoza 0.'6mg'$  daily for 7 days; if tolerating well - increase to 1.'2mg'$  daily  ?START Jardiance '10mg'$  daily  ?RESUME Glipizide 2.'5mg'$  twice daily ?Continue to check your blood sugars at home twice daily. Follow up in 4 weeks, or sooner if needed.  ? ? ?If you have any questions or concerns, please call our clinic at [phone] between 9am-5pm and after hours call [phone] and ask for the internal medicine resident on call. If you feel you are having a medical emergency please call 911.  ? ?Thank you, we look forward to helping you remain healthy! ? ? ?

## 2021-08-09 ENCOUNTER — Other Ambulatory Visit (HOSPITAL_COMMUNITY): Payer: Self-pay

## 2021-08-09 NOTE — Progress Notes (Signed)
Internal Medicine Clinic Attending ? ?Case discussed with Dr. [name]  At the time of the visit.  We reviewed the resident?s history and exam and pertinent patient test results.  I agree with the assessment, diagnosis, and plan of care documented in the resident?s note.  ? ?Agree that the primary barrier to patient controlling her T2DM has been inability to afford medications. She does not have prescription coverage, so we sent meds to [facility] today. Quick follow-up scheduled in 4 weeks. At that visit, please make sure she has been able to fill all meds, and uptitrate Victoza & Jardiance if not done already. I'd be hesitant to go up much on the glipizide  ?

## 2021-08-13 ENCOUNTER — Other Ambulatory Visit (HOSPITAL_COMMUNITY): Payer: Self-pay

## 2021-08-20 ENCOUNTER — Telehealth: Payer: Self-pay | Admitting: *Deleted

## 2021-08-20 NOTE — Chronic Care Management (AMB) (Signed)
?  Care Management  ? ?Note ? ?08/20/2021 ?Name: [name] MRN: [id number] DOB: 01-25-1946 ? ?[name] is a 76 y.o. year old female who is a primary care patient of [name], Vasilios, MD and is actively engaged with the care management team. I reached out to Tonita Cong by phone today to assist with scheduling an initial visit with the BSW ? ?Follow up plan: ?Telephone appointment with care management team member scheduled for:08/23/21 ? ?Laverda Sorenson  ?Care Guide, Embedded Care Coordination ?[place]  Care Management  ?Direct Dial: [phone] ? ?

## 2021-08-20 NOTE — Chronic Care Management (AMB) (Signed)
?  Care Management  ? ?Note ? ?08/20/2021 ?Name: [name] MRN: [id number] DOB: [birth year] ? ?[name] is a 76 y.o. year old female who is a primary care patient of [name], Vasilios, MD and is actively engaged with the care management team. I reached out to Tonita Cong by phone today to assist with scheduling an initial visit with the RN Case Manager ? ?Follow up plan: ?Unsuccessful telephone outreach attempt made. A HIPAA compliant phone message was left for the patient providing contact information and requesting a return call.  ?The care management team will reach out to the patient again over the next 7 days.  ?If patient returns call to provider office, please advise to call [place] at [phone]. ? ?Laverda Sorenson  ?Care Guide, Embedded Care Coordination ?[place]  Care Management  ?Direct Dial: [phone] ? ?

## 2021-08-21 DIAGNOSIS — H2513 Age-related nuclear cataract, bilateral: Secondary | ICD-10-CM | POA: Diagnosis not present

## 2021-08-21 DIAGNOSIS — H524 Presbyopia: Secondary | ICD-10-CM | POA: Diagnosis not present

## 2021-08-21 DIAGNOSIS — E113293 Type 2 diabetes mellitus with mild nonproliferative diabetic retinopathy without macular edema, bilateral: Secondary | ICD-10-CM | POA: Diagnosis not present

## 2021-08-23 ENCOUNTER — Telehealth: Payer: Self-pay

## 2021-08-23 ENCOUNTER — Telehealth: Payer: Medicare Other

## 2021-08-23 NOTE — Telephone Encounter (Signed)
?  Care Management  ? ?Outreach Note ? ?08/23/2021 ?Name: [name] MRN: [id number] DOB: 1945-09-28 ? ?Referred by: Riesa Pope, MD ?Reason for referral : No chief complaint on file. ? ? ?An unsuccessful telephone outreach was attempted today. The patient was referred to the case management team for assistance with care management and care coordination.  ? ?Follow Up Plan: If patient returns call to provider office, please advise to call [place] at [phone]. ? ?[name], RN, BSN, CCM ?Care Management Coordinator ?[facility] ?Phone: [phone]/TCC: [phone]  ?

## 2021-08-27 LAB — HM DIABETES EYE EXAM

## 2021-08-28 ENCOUNTER — Telehealth: Payer: Self-pay | Admitting: *Deleted

## 2021-08-28 DIAGNOSIS — J3489 Other specified disorders of nose and nasal sinuses: Secondary | ICD-10-CM | POA: Diagnosis not present

## 2021-08-28 DIAGNOSIS — J329 Chronic sinusitis, unspecified: Secondary | ICD-10-CM | POA: Diagnosis not present

## 2021-08-29 DIAGNOSIS — R448 Other symptoms and signs involving general sensations and perceptions: Secondary | ICD-10-CM | POA: Diagnosis not present

## 2021-08-29 DIAGNOSIS — R519 Headache, unspecified: Secondary | ICD-10-CM | POA: Diagnosis not present

## 2021-08-29 NOTE — Chronic Care Management (AMB) (Signed)
?  Care Management  ? ?Note ? ?08/29/2021 ?Name: [name] MRN: [id number] DOB: 1946/03/17 ? ?[name] is a 76 y.o. year old female who is a primary care patient of [name], Vasilios, MD and is actively engaged with the care management team. I reached out to Tonita Cong by phone today to assist with re-scheduling an initial visit with the RN Case Manager ? ?Follow up plan: ?Unsuccessful telephone outreach attempt made. A HIPAA compliant phone message was left for the patient providing contact information and requesting a return call.  ?The care management team will reach out to the patient again over the next 7 days.  ?If patient returns call to provider office, please advise to call [place]  at [phone]. ? ?Laverda Sorenson  ?Care Guide, Embedded Care Coordination ?[place]  Care Management  ?Direct Dial: [phone] ? ?

## 2021-08-29 NOTE — Chronic Care Management (AMB) (Signed)
?  Care Management  ? ?Note ? ?08/29/2021 ?Name: [name] MRN: [id number] DOB: 1946/01/29 ? ?[name] is a 76 y.o. year old female who is a primary care patient of [name], Vasilios, MD and is actively engaged with the care management team. I reached out to Tonita Cong by phone today to assist with re-scheduling an initial visit with the RN Case Manager ? ?Follow up plan: ?Telephone appointment with care management team member scheduled for:09/02/21 ? ?Laverda Sorenson  ?Care Guide, Embedded Care Coordination ?[employer]  Care Management  ?Direct Dial: [phone] ? ?

## 2021-08-29 NOTE — Chronic Care Management (AMB) (Signed)
?  Care Management  ? ?Note ? ?08/28/2021 ?Name: [name] MRN: [id number] DOB: [birth year] ? ?[name] is a 76 y.o. year old female who is a primary care patient of [name], Vasilios, MD and is actively engaged with the care management team. I reached out to Tonita Cong by phone today to assist with re-scheduling an initial visit with the RN Case Manager ? ?Follow up plan: ?Unsuccessful telephone outreach attempt made. A HIPAA compliant phone message was left for the patient providing contact information and requesting a return call.  ?The care management team will reach out to the patient again over the next 7 days.  ?If patient returns call to provider office, please advise to call [place] at [phone]. ? ?Laverda Sorenson  ?Care Guide, Embedded Care Coordination ?[place]  Care Management  ?Direct Dial: [phone] ? ?

## 2021-09-02 ENCOUNTER — Ambulatory Visit: Payer: Medicare Other

## 2021-09-02 NOTE — Patient Instructions (Signed)
Visit Information ? ?Thank you for taking time to visit with me today. Please don't hesitate to contact me if I can be of assistance to you before our next scheduled telephone appointment. ? ?Our next appointment is by telephone on 10/01/21 at 1:00. ? ?Please call the care guide team at [phone] if you need to cancel or reschedule your appointment.  ? ?If you are experiencing a Mental Health or [place] or need someone to talk to, please call the [place] National Suicide Prevention Lifeline: 435 490 1894 or TTY: (224)511-0915 TTY (571) [phone]) to talk to a trained counselor  ? ?The patient verbalized understanding of instructions, educational materials, and care plan provided today and declined offer to receive copy of patient instructions, educational materials, and care plan.  ? ?The patient has been provided with contact information for the care management team and has been advised to call with any health related questions or concerns.  ? ?[name], RN, BSN, CCM ?Care Management Coordinator ?[facility] ?Phone: [phone]/YFR: [phone]  ?

## 2021-09-02 NOTE — Chronic Care Management (AMB) (Signed)
Care Management    RN Visit Note  09/02/2021 Name: [name] MRN: [id number] DOB: 11-Jan-1946  Subjective: [name] is a 76 y.o. year old female who is a primary care patient of [name], Jillian Dach, MD. The care management team was consulted for assistance with disease management and care coordination needs.    Engaged with patient by telephone for follow up visit in response to provider referral for case management and/or care coordination services.   Consent to Services:   [name] was given information about Care Management services today including:  Care Management services includes personalized support from designated clinical staff supervised by her physician, including individualized plan of care and coordination with other care providers 24/7 contact phone numbers for assistance for urgent and routine care needs. The patient may stop case management services at any time by phone call to the office staff.  Patient agreed to services and consent obtained.   Assessment: Review of patient past medical history, allergies, medications, health status, including review of consultants reports, laboratory and other test data, was performed as part of comprehensive evaluation and provision of chronic care management services.   SDOH (Social Determinants of Health) assessments and interventions performed:    Care Plan  Allergies  Allergen Reactions   Hctz [Hydrochlorothiazide] Other (See Comments)    Pancreatitis    Outpatient Encounter Medications as of 09/02/2021  Medication Sig   Accu-Chek Softclix Lancets lancets Check blood sugar up to three times daily.   atorvastatin (LIPITOR) 40 MG tablet TAKE 1 TABLET BY MOUTH EVERYDAY AT BEDTIME   chlorthalidone (HYGROTON) 25 MG tablet Take 1 tablet (25 mg total) by mouth daily.   glipiZIDE (GLUCOTROL) 5 MG tablet Take 0.5 tablets (2.5 mg total) by mouth 2 (two) times daily.   glucose blood test strip Use as instructed    lisinopril (ZESTRIL) 20 MG tablet Take 1 tablet (20 mg total) by mouth daily.   empagliflozin (JARDIANCE) 10 MG TABS tablet Take 1 tablet (10 mg total) by mouth daily before breakfast. (Patient not taking: Reported on 09/02/2021)   fluticasone (FLONASE) 50 MCG/ACT nasal spray Place 2 sprays into both nostrils daily. (Patient taking differently: Place 2 sprays into both nostrils daily as needed for allergies or rhinitis (allergies.).)   liraglutide (VICTOZA) 18 MG/3ML SOPN Inject 0.6 mg into the skin daily for 7 days, THEN 1.2 mg daily.   Polyethyl Glycol-Propyl Glycol (SYSTANE) 0.4-0.3 % SOLN Place 1 drop into both eyes daily as needed (Dry eyes). (Patient not taking: Reported on 09/02/2021)   No facility-administered encounter medications on file as of 09/02/2021.    Patient Active Problem List   Diagnosis Date Noted   Nausea 09/24/2020   Status post total left knee replacement 07/30/2020   Fatigue 02/10/2019   Primary osteoarthritis of left knee 01/25/2019   Left carpal tunnel syndrome 01/25/2019   Cervical neuropathy 08/20/2018   Bilateral carpal tunnel syndrome 06/29/2018   Depressed mood 01/21/2018   Need for immunization against influenza 01/17/2018   Osteopenia 12/13/2013   Vaginal dryness 12/13/2013   Peripheral neuropathy 11/02/2013   Health care maintenance 11/17/2012   Arthritis of knee, right 08/16/2012   Breast cancer, right breast (HCC) 09/25/2010   COLONIC POLYPS, HX OF 07/19/2007   OBESITY NOS 07/09/2006   Type 2 diabetes mellitus with renal manifestations (HCC) 03/10/2006   Hyperlipidemia 03/10/2006   Hypertension associated with diabetes (HCC) 03/10/2006   ALLERGIC RHINITIS 03/10/2006   Fatty liver 03/10/2006    Conditions  to be addressed/monitored: HTN, HLD, and DMII  Care Plan : RN Care Manager Plan of Care  Updates made by Jodelle Gross, RN since 09/02/2021 12:00 AM     Problem: Health Promotion or Disease Self-Management (General Plan of Care)      Goal:  Establish Plan of Care for Chronic Disease Management (DM2, HTN, HLD)   Start Date: 09/02/2021  This Visit's Progress: Not on track  Priority: High  Note:   Current Barriers: Successful outreach to patient this afternoon.  We discussed her current medications and what she is taking.  Patient has been unable to pick up her Jardiance as her copay is $140.00.  Collaborated with Jannette Spanner, Pharmacy Patient Advocate Specialist to see if she can assist getting patient Jardiace through the company.  Patient needs to have applied for low income subsidy for her to qualify.  This RNCM is unclear if she has applied, Camille to reach out to patient and will follow up with patient on Thursday while she is at the clinic. Patient shared that she does not take her CBG readings every day, she forgets to take them.  Discussed the importance of her checking her readings and lowering her A1C (recent draw 11.4). Patient shared that she continues to have some food insecurity but she can obtain food, if needed, through the food bank at her church.  This RNCM is going to prepare some Glucerna for her to take when she is at [facility] for her appointment.   Chronic Disease Management support and education needs related to HTN, HLD, and DMII  Financial Constraints  Non-adherence to prescribed medication regimen Difficulty obtaining medicationsd RNCM Clinical Goal(s):  Patient will verbalize understanding of plan for management of HTN, HLD, and DMII as evidenced by discussions with provider and RNCM. take all medications exactly as prescribed and will call provider for medication related questions as evidenced by medication reviews with provider/RNCM. demonstrate Improved adherence to prescribed treatment plan for HTN, HLD, and DMII as evidenced by decrease in A1C. continue to work with RN Care Manager to address care management and care coordination needs related to  HTN, HLD, and DMII as evidenced by adherence to CM Team  Scheduled appointments work with Child psychotherapist to address  related to the management of Limited access to food and Medication procurement related to the management of HTN, HLD, and DMII as evidenced by review of EMR and patient or Child psychotherapist report through collaboration with Medical illustrator, provider, and care team.  Interventions: 1:1 collaboration with primary care provider regarding development and update of comprehensive plan of care as evidenced by provider attestation and co-signature Inter-disciplinary care team collaboration (see longitudinal plan of care) Evaluation of current treatment plan related to  self management and patient's adherence to plan as established by provider Diabetes Interventions:  (Status:  Goal on track:  NO.) Long Term Goal Assessed patient's understanding of A1c goal: <7% Provided education to patient about basic DM disease process Reviewed medications with patient and discussed importance of medication adherence Counseled on importance of regular laboratory monitoring as prescribed Discussed plans with patient for ongoing care management follow up and provided patient with direct contact information for care management team Referral made to pharmacy team for assistance with obtaining Jardiance. Review of patient status, including review of consultants reports, relevant laboratory and other test results, and medications completed Lab Results  Component Value Date   HGBA1C 11.4 (A) 08/08/2021  Hypertension Interventions:  (Status:  Goal on track:  Yes.)  Long Term Goal Last practice recorded BP readings:  BP Readings from Last 3 Encounters:  08/08/21 119/63  02/21/21 124/66  01/24/21 (!) 187/80  Most recent eGFR/CrCl:  Lab Results  Component Value Date   EGFR 72 02/21/2021    No components found for: CRCL  Evaluation of current treatment plan related to hypertension self management and patient's adherence to plan as established by provider Reviewed  medications with patient and discussed importance of compliance Discussed plans with patient for ongoing care management follow up and provided patient with direct contact information for care management team Reviewed scheduled/upcoming provider appointments including: Appointment at clinic on Thursday, 09/05/21@0945 .  Patient Goals/Self-Care Activities: Take all medications as prescribed Attend all scheduled provider appointments Call pharmacy for medication refills 3-7 days in advance of running out of medications Attend church or other social activities Perform all self care activities independently  Call provider office for new concerns or questions  Work with the social worker to address care coordination needs and will continue to work with the clinical team to address health care and disease management related needs  Follow Up Plan:  The patient has been provided with contact information for the care management team and has been advised to call with any health related questions or concerns.      Plan: The patient has been provided with contact information for the care management team and has been advised to call with any health related questions or concerns.   Jodelle Gross, RN, BSN, CCM Care Management Coordinator [facility] Phone: [phone]/Fax: [phone]

## 2021-09-04 ENCOUNTER — Telehealth: Payer: Self-pay

## 2021-09-04 NOTE — Telephone Encounter (Signed)
Left pt voicemail requesting call back regarding patient assistance options. ? ? ?Will discuss LIS & PAP for jardiance. ?

## 2021-09-04 NOTE — Telephone Encounter (Signed)
Pt returned phone call. ? ?Informed she may qualify for patient assistance with [employer] for jardiance medication. Pt says she was previously denied for [place] and would look for her denial letter & get it to me as soon as possible.  ? ?Pt would like for me to leave the [facility] Cares application here at the office for her to sign at her appt 09/05/21. Will route to provider as well. ?

## 2021-09-04 NOTE — Telephone Encounter (Signed)
Pt has appt 09/05/21 and would like to sign application. It will be in my "med assistance" box at the front office highlighted & ready for pt to sign.  ? ?Thanks! ?

## 2021-09-05 ENCOUNTER — Ambulatory Visit (INDEPENDENT_AMBULATORY_CARE_PROVIDER_SITE_OTHER): Payer: Medicare Other | Admitting: Internal Medicine

## 2021-09-05 ENCOUNTER — Encounter: Payer: Self-pay | Admitting: Internal Medicine

## 2021-09-05 ENCOUNTER — Other Ambulatory Visit: Payer: Self-pay

## 2021-09-05 ENCOUNTER — Other Ambulatory Visit (HOSPITAL_COMMUNITY): Payer: Self-pay

## 2021-09-05 DIAGNOSIS — E785 Hyperlipidemia, unspecified: Secondary | ICD-10-CM | POA: Diagnosis not present

## 2021-09-05 DIAGNOSIS — E1121 Type 2 diabetes mellitus with diabetic nephropathy: Secondary | ICD-10-CM

## 2021-09-05 DIAGNOSIS — E1159 Type 2 diabetes mellitus with other circulatory complications: Secondary | ICD-10-CM

## 2021-09-05 DIAGNOSIS — I152 Hypertension secondary to endocrine disorders: Secondary | ICD-10-CM

## 2021-09-05 DIAGNOSIS — K5900 Constipation, unspecified: Secondary | ICD-10-CM

## 2021-09-05 MED ORDER — ATORVASTATIN CALCIUM 40 MG PO TABS
40.0000 mg | ORAL_TABLET | Freq: Every day | ORAL | 1 refills | Status: DC
  Filled 2021-09-05: qty 90, 90d supply, fill #0

## 2021-09-05 MED ORDER — EMPAGLIFLOZIN 10 MG PO TABS
10.0000 mg | ORAL_TABLET | Freq: Every day | ORAL | 2 refills | Status: DC
  Filled 2021-09-05: qty 90, 90d supply, fill #0

## 2021-09-05 MED ORDER — POLYETHYLENE GLYCOL 3350 17 G PO PACK
17.0000 g | PACK | Freq: Every day | ORAL | 0 refills | Status: AC
Start: 2021-09-05 — End: ?
  Filled 2021-09-05: qty 14, 14d supply, fill #0

## 2021-09-05 MED ORDER — GLIPIZIDE 5 MG PO TABS
2.5000 mg | ORAL_TABLET | Freq: Two times a day (BID) | ORAL | 2 refills | Status: DC
  Filled 2021-09-05: qty 90, 90d supply, fill #0

## 2021-09-05 MED ORDER — CHLORTHALIDONE 25 MG PO TABS
25.0000 mg | ORAL_TABLET | Freq: Every day | ORAL | 1 refills | Status: DC
  Filled 2021-09-05: qty 90, 90d supply, fill #0

## 2021-09-05 MED ORDER — LIRAGLUTIDE 18 MG/3ML ~~LOC~~ SOPN
PEN_INJECTOR | SUBCUTANEOUS | 0 refills | Status: DC
  Filled 2021-09-05: qty 6, 33d supply, fill #0

## 2021-09-05 NOTE — Assessment & Plan Note (Signed)
Patient states her last several weeks she has been constipated.  Having a bowel movement every few days.  She has not tried fiber supplements.  However she tried an over-the-counter supplement that relieved her constipation however, when going to the store to buy a new one the prices were higher and she cannot afford them.  She denies any abdominal pain or cramping.  No nausea or vomiting.  No hematochezia. ? ? ?P: ?-MiraLAX prescribed ?

## 2021-09-05 NOTE — Progress Notes (Signed)
? ?CC: Diabetes management, medication refill, constipation ? ?HPI: ? ?[name] is a 76 y.o. female with a past medical history stated below and presents today for CC listed above. Please see problem based assessment and plan for additional details. ? ?Past Medical History:  ?Diagnosis Date  ? Allergy   ? Anemia   ? Breast CA ([place])   ? (Rt) breast ca dx 4/12---  S/P RADICAL MASTECTOMY AND CHEMORADIATION  ? Diabetes mellitus ORAL MED  ? Fatty liver disease, nonalcoholic   ? History of hyperkalemia   ? Hx antineoplastic chemo  10/12 - 06/24/11  ? [place] COMPLETED 06/24/11  ? Hyperlipidemia   ? Hypertension   ? Mild nonproliferative diabetic retinopathy of right eye ([place]) 03/19/11  ? Dr. Joseph Art  ? Numbness and tingling   ? Hx: of in fingers and toes since chemotherapy  ? OA (osteoarthritis) of knee   ? RIGHT LEG  ? Obesity   ? Renal lesion   ? ? ?Current Outpatient Medications on File Prior to Visit  ?Medication Sig Dispense Refill  ? Accu-Chek Softclix Lancets lancets Check blood sugar up to three times daily. 300 each 3  ? fluticasone (FLONASE) 50 MCG/ACT nasal spray Place 2 sprays into both nostrils daily. (Patient taking differently: Place 2 sprays into both nostrils daily as needed for allergies or rhinitis (allergies.).) 16 g 0  ? glucose blood test strip Use as instructed 100 each 12  ? lisinopril (ZESTRIL) 20 MG tablet Take 1 tablet (20 mg total) by mouth daily. 90 tablet 2  ? Polyethyl Glycol-Propyl Glycol (SYSTANE) 0.4-0.3 % SOLN Place 1 drop into both eyes daily as needed (Dry eyes). (Patient not taking: Reported on 09/02/2021)    ? ?No current facility-administered medications on file prior to visit.  ? ? ?Family History  ?Problem Relation Age of Onset  ? Stroke Father   ? Cancer Sister   ?     breast  ? Cancer Sister   ?     COLON CANCER  ? Stroke Sister   ? Heart disease Brother   ? ? ?Social History  ? ?Socioeconomic History  ? Marital status: Married  ?  Spouse name: Not on file  ?  Number of children: 4  ? Years of education: 43  ? Highest education level: Not on file  ?Occupational History  ? Not on file  ?Tobacco Use  ? Smoking status: Never  ? Smokeless tobacco: Never  ?Vaping Use  ? Vaping Use: Never used  ?Substance and Sexual Activity  ? Alcohol use: No  ?  Alcohol/week: 0.0 standard drinks  ? Drug use: No  ?  Types: Flunitrazepam  ? Sexual activity: Not on file  ?  Comment: MENARCHE AGE 76, G4, P4, PARITY AGE 40, HRT 20+ YEARS    ?Other Topics Concern  ? Not on file  ?Social History Narrative  ? Current Social History 01/14/2021    ?   ? Patient lives with spouse in a home is 2 stories. There are 2 steps up to the entrance the patient uses with railings.  ?   ? Patient's method of transportation is personal car.  ?   ? The highest level of education was high school diploma.  ?   ? The patient currently retired.  ?   ? Identified important Relationships are "my family"  ?   ? Pets : 1  ?    ? Interests / Fun: "[employer]"   ?   ?  Current Stressors: "bills"  ?   ? Religious / Personal Beliefs: Baptist   ?   ? ?Social Determinants of Health  ? ?Financial Resource Strain: Not on file  ?Food Insecurity: Food Insecurity Present  ? Worried About Charity fundraiser in the Last Year: Sometimes true  ? Ran Out of Food in the Last Year: Sometimes true  ?Transportation Needs: No Transportation Needs  ? Lack of Transportation (Medical): No  ? Lack of Transportation (Non-Medical): No  ?Physical Activity: Not on file  ?Stress: Not on file  ?Social Connections: Not on file  ?Intimate Partner Violence: Not on file  ? ? ?Review of Systems: ?ROS negative except for what is noted on the assessment and plan. ? ?Vitals:  ? 09/05/21 1009  ?BP: 121/65  ?Pulse: 81  ?Temp: 98 ?F (36.7 ?C)  ?TempSrc: Oral  ?SpO2: 100%  ?Weight: 152 lb 3.2 oz (69 kg)  ?Height: '4\' 11"'$  (1.499 m)  ? ? ? ?Physical Exam: ?Constitutional: well-appearing elderly woman sitting in the chair, in no acute distress ?HENT: normocephalic atraumatic,  mucous membranes moist ?Eyes: conjunctiva non-erythematous ?Neck: supple ?Cardiovascular: regular rate and rhythm, no m/r/g ?Pulmonary/Chest: normal work of breathing on room air, lungs clear to auscultation bilaterally ?Abdominal: soft, non-tender, non-distended ?MSK: normal bulk and tone ?Neurological: alert & oriented x 3, 5/5 strength in bilateral upper and lower extremities, normal gait ?Skin: warm and dry ?Psych: Normal mood and behavior ? ? ?Assessment & Plan:  ? ?See Encounters Tab for problem based charting. ? ?Patient discussed with Dr. Cain Sieve ?Timothy Lasso, M.D. ?[facility] Internal Medicine, PGY-1 ?Pager: [phone], Phone: [phone] ?Date 09/05/2021 Time 12:40 PM ? ?

## 2021-09-05 NOTE — Patient Instructions (Signed)
Thank you, [name] for allowing [place] to provide your care today. Today we discussed diabetes management, diet and exercise and medication refill.   ? ?I have ordered the following labs for you: ? ?Lab Orders    ?     BMP8+Anion Gap    ?     Microalbumin / Creatinine Urine Ratio     ? ?Tests ordered today: ? ?None ? ?Referrals ordered today:  ? ?Referral Orders  ?No referral(s) requested today  ?  ? ?I have ordered the following medication/changed the following medications:  ? ?Stop the following medications: ?Medications Discontinued During This Encounter  ?Medication Reason  ? chlorthalidone (HYGROTON) 25 MG tablet Reorder  ? atorvastatin (LIPITOR) 40 MG tablet Reorder  ? glipiZIDE (GLUCOTROL) 5 MG tablet Reorder  ? empagliflozin (JARDIANCE) 10 MG TABS tablet Reorder  ? liraglutide (VICTOZA) 18 MG/3ML SOPN Reorder  ?  ? ?Start the following medications: ?Meds ordered this encounter  ?Medications  ? polyethylene glycol (MIRALAX) 17 g packet  ?  Sig: Take 17 g by mouth daily.  ?  Dispense:  14 each  ?  Refill:  0  ?  IM program  ? atorvastatin (LIPITOR) 40 MG tablet  ?  Sig: TAKE 1 TABLET BY MOUTH EVERYDAY AT BEDTIME  ?  Dispense:  90 tablet  ?  Refill:  1  ?  IM program  ? chlorthalidone (HYGROTON) 25 MG tablet  ?  Sig: Take 1 tablet (25 mg total) by mouth daily.  ?  Dispense:  90 tablet  ?  Refill:  1  ?  IM program  ? empagliflozin (JARDIANCE) 10 MG TABS tablet  ?  Sig: Take 1 tablet (10 mg total) by mouth daily before breakfast.  ?  Dispense:  90 tablet  ?  Refill:  2  ?  IM PROGRAM  ? glipiZIDE (GLUCOTROL) 5 MG tablet  ?  Sig: Take 0.5 tablets (2.5 mg total) by mouth 2 (two) times daily.  ?  Dispense:  90 tablet  ?  Refill:  2  ?  IM program  ? liraglutide (VICTOZA) 18 MG/3ML SOPN  ?  Sig: Inject 0.6 mg into the skin daily for 7 days, THEN 1.2 mg daily.  ?  Dispense:  6.7 mL  ?  Refill:  0  ?  IM PROGRAM  ?  ? ?Follow up: 2 weeks ? ?Remember: Please pick up your medications at the [facility].   Please write down your morning and evening sugar levels and bring them with you at our 2-week follow-up.  Continue taking all your medications as prescribed.  Hopefully the MiraLAX will help with your constipation as well. ? ?Should you have any questions or concerns please call the internal medicine clinic at [phone].   ? ?Timothy Lasso, MD ?[place] ?  ?

## 2021-09-05 NOTE — Assessment & Plan Note (Addendum)
Home medications include chlorthalidone 25 mg daily and lisinopril 20 mg daily.  BMP last obtained in October 2022 which shows no electrolyte derangement and kidney function within normal limits.  Blood pressure well controlled at 121/65.  Patient is tolerating medication well.  Denies any complaints at this time. ? ?P: ?-Continue medication regimen ?-BMP ordered ?

## 2021-09-05 NOTE — Assessment & Plan Note (Addendum)
Lipid profile obtained October 2022 revealed total cholesterol within reference range at 132 and LDL at 62; triglycerides mildly elevated at 186.  Home medications include atorvastatin 40 mg daily.  Tolerating well. ? ?P: ?-Continue current regimen ?

## 2021-09-05 NOTE — Assessment & Plan Note (Addendum)
Most recent A1c obtained April 2023 was elevated at 11.4%.  At that time she was started on Victoza 0.6 mg daily, Jardiance 10 mg daily and was to resume glipizide 2.5 twice daily. Glucose readings in the 200s; she feels tingling in her right are and headache when she feels her glucose is high. Fasting morning glucose is 181-300s. Diet- orange juice, pepsi sodas, breads, biscuits, sausages, and candy. Exercise, she plans to start walking when the weather warms up.  She did not pick up the new prescription for victoza and jardiance.  She has only been taking glipizide.  Due to large co-pay amounts and coverage with patient's insurance, Victoza and Vania Rea may be difficult to obtain. ? ? ?P: ?-Diabetic foot exam completed today ?-Urine microalbumin ordered ?-Follow-up with patient regarding affordability for medication ?-Seek alternative if unable ?

## 2021-09-06 LAB — BMP8+ANION GAP
Anion Gap: 20 mmol/L — ABNORMAL HIGH (ref 10.0–18.0)
BUN/Creatinine Ratio: 18 (ref 12–28)
BUN: 20 mg/dL (ref 8–27)
CO2: 25 mmol/L (ref 20–29)
Calcium: 9.7 mg/dL (ref 8.7–10.3)
Chloride: 93 mmol/L — ABNORMAL LOW (ref 96–106)
Creatinine, Ser: 1.14 mg/dL — ABNORMAL HIGH (ref 0.57–1.00)
Glucose: 206 mg/dL — ABNORMAL HIGH (ref 70–99)
Potassium: 3.4 mmol/L — ABNORMAL LOW (ref 3.5–5.2)
Sodium: 138 mmol/L (ref 134–144)
eGFR: 51 mL/min/{1.73_m2} — ABNORMAL LOW (ref >59)

## 2021-09-06 LAB — MICROALBUMIN / CREATININE URINE RATIO
Creatinine, Urine: 125.2 mg/dL
Microalb/Creat Ratio: 8 mg/g creat (ref 0–29)
Microalbumin, Urine: 10.5 ug/mL

## 2021-09-12 ENCOUNTER — Telehealth: Payer: Self-pay

## 2021-09-12 NOTE — Telephone Encounter (Signed)
liraglutide (VICTOZA) 18 MG/3ML SOPN  ? ?empagliflozin (JARDIANCE) 10 MG TABS tablet  ? ?Patient is requesting alternative medication to be sent due to not being able to afford to pay for both medications OOP. ?

## 2021-09-16 ENCOUNTER — Telehealth: Payer: Self-pay

## 2021-09-16 NOTE — Telephone Encounter (Signed)
Left message requesting call back for patient assistance.  ? ?Need more paperwork for Jardiance application. ?Can mail application for victoza if shes interested. ?

## 2021-09-16 NOTE — Telephone Encounter (Signed)
Pt returned phone call.  ? ?Pt waiting on response from [facility] for the LIS application. Pt would also like me to mail the novo nordisk application to her home. We will follow up with each other on where each application stands.  ?

## 2021-09-18 NOTE — Telephone Encounter (Signed)
Left message informing pt the application I was going to mail to her (novo nordisk) will now be here at the office ready for signature since she has an appt 09/19/21 Dr Jeanice Lim. Wanted to remind her she would need to provide proof of income as well ? ?Gave call back number [phone] ?

## 2021-09-19 ENCOUNTER — Telehealth: Payer: Medicare Other

## 2021-09-19 ENCOUNTER — Other Ambulatory Visit: Payer: Self-pay

## 2021-09-19 ENCOUNTER — Telehealth: Payer: Self-pay | Admitting: Licensed Clinical Social Worker

## 2021-09-19 ENCOUNTER — Ambulatory Visit (INDEPENDENT_AMBULATORY_CARE_PROVIDER_SITE_OTHER): Payer: Medicare Other | Admitting: Internal Medicine

## 2021-09-19 DIAGNOSIS — Z7984 Long term (current) use of oral hypoglycemic drugs: Secondary | ICD-10-CM | POA: Diagnosis not present

## 2021-09-19 DIAGNOSIS — E1121 Type 2 diabetes mellitus with diabetic nephropathy: Secondary | ICD-10-CM | POA: Diagnosis not present

## 2021-09-19 MED ORDER — LORATADINE 10 MG PO TABS
10.0000 mg | ORAL_TABLET | Freq: Every day | ORAL | 0 refills | Status: DC
  Filled 2021-09-19: qty 90, 90d supply, fill #0

## 2021-09-19 MED ORDER — EMPAGLIFLOZIN 10 MG PO TABS
10.0000 mg | ORAL_TABLET | Freq: Every day | ORAL | 2 refills | Status: DC
  Filled 2021-09-19: qty 30, 30d supply, fill #0
  Filled 2021-10-23: qty 30, 30d supply, fill #1
  Filled 2021-11-25: qty 30, 30d supply, fill #2
  Filled 2021-12-20: qty 30, 30d supply, fill #3

## 2021-09-19 MED ORDER — INSULIN GLARGINE 100 UNIT/ML ~~LOC~~ SOLN: 10.0000 [IU] | Freq: Every day | SUBCUTANEOUS | 0 refills | Status: DC

## 2021-09-19 MED ORDER — LIRAGLUTIDE 18 MG/3ML ~~LOC~~ SOPN
PEN_INJECTOR | SUBCUTANEOUS | 0 refills | Status: DC
  Filled 2021-09-19: qty 9, 30d supply, fill #0

## 2021-09-19 NOTE — Assessment & Plan Note (Addendum)
On prior office visit patient was started on Victoza 0.6 mg daily and Jardiance 10 mg daily and was to continue glipizide 2.5 twice daily.  Due to financial constraints patient was unable to obtain Victoza and Jardiance.   She presents today with her glucose meter and her glucose range was above goal, ranging from 200s to 300s. I worked closely with [name] to find alternative ways for patient to obtain her medications.  She is currently submitting documents for the patient assistance program.  Also, prescriptions for Victoza and Jardiance was sent to [facility].  Per [name] the pharmacist she stated that medications can be sent there and patients can create a charge account.  [name] will be able to obtain her medications and pay as she is able.  Hopeful that patient is able to obtain her medications.  P: -Follow-up in 4 weeks -Continue glipizide 2.5 mg daily -Started on Victoza 0.6 mg daily -Started on Jardiance 10 mg daily

## 2021-09-19 NOTE — Telephone Encounter (Signed)
  Care Management   Follow Up Note   09/19/2021 Name: [name] MRN: [id number] DOB: 03-04-1946   Referred by: Riesa Pope, MD Reason for referral : No chief complaint on file.   An unsuccessful telephone outreach was attempted today. The patient was referred to the case management team for assistance with care management and care coordination.   Follow Up Plan: Patient has SW contact information and will contact contact SW if needed.  Lenor Derrick, MSW  Social Worker [facility] Care Management  [phone]

## 2021-09-19 NOTE — Patient Instructions (Signed)
Thank you, [name] for allowing [place] to provide your care today. Today we discussed diabetes control.    I have ordered the following labs for you:  Lab Orders  No laboratory test(s) ordered today     Tests ordered today:  None  Referrals ordered today:   Referral Orders  No referral(s) requested today     I have ordered the following medication/changed the following medications:   Start the following medications: Meds ordered this encounter  Medications   liraglutide (VICTOZA) 18 MG/3ML SOPN    Sig: Inject 0.6 mg into the skin daily for 7 days, THEN 1.2 mg daily.    Dispense:  9 mL    Refill:  0   empagliflozin (JARDIANCE) 10 MG TABS tablet    Sig: Take 1 tablet (10 mg total) by mouth daily before breakfast.    Dispense:  90 tablet    Refill:  2   loratadine (CLARITIN) 10 MG tablet    Sig: Take 1 tablet (10 mg total) by mouth daily.    Dispense:  90 tablet    Refill:  0   DISCONTD: insulin glargine (LANTUS) 100 UNIT/ML injection    Sig: Inject 0.1 mLs (10 Units total) into the skin daily. Take 10 units daily    Dispense:  9 mL    Refill:  0     Follow up: 4 weeks   Remember: Please go pick up your Jardiance, Claritin and Victoza from [facility].  Address:[address]. [place], [place]   Should you have any questions or concerns please call the internal medicine clinic at [phone].    Timothy Lasso, MD [name]

## 2021-09-19 NOTE — Progress Notes (Signed)
CC: Diabetes follow-up  HPI:  [name] is a 76 y.o. female with a past medical history stated below and presents today for CC listed above. Please see problem based assessment and plan for additional details.  Past Medical History:  Diagnosis Date   Allergy    Anemia    Breast CA ([place])    (Rt) breast ca dx 4/12---  S/P RADICAL MASTECTOMY AND CHEMORADIATION   Diabetes mellitus ORAL MED   Fatty liver disease, nonalcoholic    History of hyperkalemia    Hx antineoplastic chemo  10/12 - 06/24/11   [place] COMPLETED 06/24/11   Hyperlipidemia    Hypertension    Mild nonproliferative diabetic retinopathy of right eye ([place]) 03/19/11   Dr. Joseph Art   Numbness and tingling    Hx: of in fingers and toes since chemotherapy   OA (osteoarthritis) of knee    RIGHT LEG   Obesity    Renal lesion     Current Outpatient Medications on File Prior to Visit  Medication Sig Dispense Refill   Accu-Chek Softclix Lancets lancets Check blood sugar up to three times daily. 300 each 3   atorvastatin (LIPITOR) 40 MG tablet Take 1 tablet (40 mg total) by mouth daily at bedtime. 90 tablet 1   chlorthalidone (HYGROTON) 25 MG tablet Take 1 tablet (25 mg total) by mouth daily. 90 tablet 1   fluticasone (FLONASE) 50 MCG/ACT nasal spray Place 2 sprays into both nostrils daily. (Patient taking differently: Place 2 sprays into both nostrils daily as needed for allergies or rhinitis (allergies.).) 16 g 0   glipiZIDE (GLUCOTROL) 5 MG tablet Take 0.5 tablets (2.5 mg total) by mouth 2 (two) times daily. 90 tablet 2   glucose blood test strip Use as instructed 100 each 12   lisinopril (ZESTRIL) 20 MG tablet Take 1 tablet (20 mg total) by mouth daily. 90 tablet 2   Polyethyl Glycol-Propyl Glycol (SYSTANE) 0.4-0.3 % SOLN Place 1 drop into both eyes daily as needed (Dry eyes). (Patient not taking: Reported on 09/02/2021)     polyethylene glycol (MIRALAX) 17 g packet Take 17 g by mouth daily. 14 each 0    No current facility-administered medications on file prior to visit.    Family History  Problem Relation Age of Onset   Stroke Father    Cancer Sister        breast   Cancer Sister        COLON CANCER   Stroke Sister    Heart disease Brother     Social History   Socioeconomic History   Marital status: Married    Spouse name: Not on file   Number of children: 4   Years of education: 16   Highest education level: Not on file  Occupational History   Not on file  Tobacco Use   Smoking status: Never   Smokeless tobacco: Never  Vaping Use   Vaping Use: Never used  Substance and Sexual Activity   Alcohol use: No    Alcohol/week: 0.0 standard drinks   Drug use: No    Types: Flunitrazepam   Sexual activity: Not on file    Comment: MENARCHE AGE 56, G4, P4, PARITY AGE 34, HRT 20+ YEARS    Other Topics Concern   Not on file  Social History Narrative   Current Social History 01/14/2021        Patient lives with spouse in a home is 2 stories. There are 2 steps  up to the entrance the patient uses with railings.      Patient's method of transportation is personal car.      The highest level of education was high school diploma.      The patient currently retired.      Identified important Relationships are "my family"      Pets : 1       Interests / Fun: "[employer]"       Current Stressors: "bills"      Religious / Personal Beliefs: Baptist       Social Determinants of Health   Financial Resource Strain: Not on file  Food Insecurity: Food Insecurity Present   Worried About Charity fundraiser in the Last Year: Sometimes true   Arboriculturist in the Last Year: Sometimes true  Transportation Needs: No Transportation Needs   Lack of Transportation (Medical): No   Lack of Transportation (Non-Medical): No  Physical Activity: Not on file  Stress: Not on file  Social Connections: Not on file  Intimate Partner Violence: Not on file    Review of Systems: ROS  negative except for what is noted on the assessment and plan.  Vitals:   09/19/21 1344  BP: 121/60  Pulse: 99  Temp: 99.1 F (37.3 C)  TempSrc: Oral  SpO2: 100%  Weight: 153 lb 8 oz (69.6 kg)     Physical Exam: Constitutional: well-appearing elderly woman sitting in the chair, in no acute distress HENT: normocephalic atraumatic, mucous membranes moist Eyes: conjunctiva non-erythematous Neck: supple Cardiovascular: regular rate and rhythm, no m/r/g Pulmonary/Chest: normal work of breathing on room air, lungs clear to auscultation bilaterally Abdominal: soft, non-tender, non-distended MSK: normal bulk and tone Neurological: alert & oriented x 3, 5/5 strength in bilateral upper and lower extremities, normal gait Skin: warm and dry Psych: Normal mood   Assessment & Plan:   See Encounters Tab for problem based charting.  Patient discussed with Dr. Pecolia Ades, M.D. [place] Internal Medicine, PGY-1 Pager: [phone], Phone: [phone] Date 09/19/2021 Time 4:29 PM

## 2021-09-20 ENCOUNTER — Other Ambulatory Visit: Payer: Self-pay

## 2021-09-20 ENCOUNTER — Telehealth: Payer: Self-pay | Admitting: *Deleted

## 2021-09-20 ENCOUNTER — Other Ambulatory Visit (HOSPITAL_COMMUNITY): Payer: Self-pay

## 2021-09-20 NOTE — Telephone Encounter (Signed)
Patient called stated that she did not get her prescription for the Claritin yesterday from the pharmacy.  Call to Pharmacy Claritin is OTC and patient should have been informed when she picked up the other prescriptions.  RTC to patient informed her that the Claritin is OTC and that she can return to the pharmacy or can pick up at any pharmacy over the counter.  Patient voiced understanding of the plan and will pick up.

## 2021-09-23 NOTE — Progress Notes (Signed)
Internal Medicine Clinic Attending  Case discussed with Dr. Jeanice Lim  At the time of the visit.  We reviewed the resident's history and exam and pertinent patient test results.  I agree with the assessment, diagnosis, and plan of care documented in the resident's note.    I reached out to pharmacy and Dr. Jeanice Lim today to check if patient was able to fill her [place]. Will follow up with them re alternative regimen if patient can't access these meds

## 2021-09-25 ENCOUNTER — Telehealth: Payer: Self-pay

## 2021-09-25 NOTE — Telephone Encounter (Signed)
Attempted to call pt at both phone numbers on file, no vm available.  Giving reminder call for patient assistance documents needed (proof of income, house household size, LIS denial letter).

## 2021-10-01 ENCOUNTER — Encounter: Payer: Self-pay | Admitting: Dietician

## 2021-10-01 ENCOUNTER — Other Ambulatory Visit: Payer: Self-pay

## 2021-10-01 ENCOUNTER — Ambulatory Visit: Payer: Medicare Other

## 2021-10-01 ENCOUNTER — Other Ambulatory Visit: Payer: Self-pay | Admitting: Internal Medicine

## 2021-10-01 MED ORDER — INSULIN PEN NEEDLE 32G X 4 MM MISC
3 refills | Status: DC
  Filled 2021-10-01: qty 100, 33d supply, fill #0
  Filled 2021-10-03 (×2): qty 100, 25d supply, fill #0

## 2021-10-01 MED ORDER — PEN NEEDLES 32G X 4 MM MISC: 3 refills | Status: DC

## 2021-10-01 NOTE — Chronic Care Management (AMB) (Signed)
Care Management    RN Visit Note  10/01/2021 Name: [name] MRN: [id number] DOB: 1946/03/04  Subjective: [name] is a 76 y.o. year old female who is a primary care patient of [name], Candace Gallus, MD. The care management team was consulted for assistance with disease management and care coordination needs.    Engaged with patient by telephone for follow up visit in response to provider referral for case management and/or care coordination services.   Consent to Services:   [name] was given information about Care Management services today including:  Care Management services includes personalized support from designated clinical staff supervised by her physician, including individualized plan of care and coordination with other care providers 24/7 contact phone numbers for assistance for urgent and routine care needs. The patient may stop case management services at any time by phone call to the office staff.  Patient agreed to services and consent obtained.   Assessment: Review of patient past medical history, allergies, medications, health status, including review of consultants reports, laboratory and other test data, was performed as part of comprehensive evaluation and provision of chronic care management services.   SDOH (Social Determinants of Health) assessments and interventions performed:    Care Plan  Allergies  Allergen Reactions   Hctz [Hydrochlorothiazide] Other (See Comments)    Pancreatitis    Outpatient Encounter Medications as of 10/01/2021  Medication Sig   Accu-Chek Softclix Lancets lancets Check blood sugar up to three times daily.   atorvastatin (LIPITOR) 40 MG tablet Take 1 tablet (40 mg total) by mouth daily at bedtime.   chlorthalidone (HYGROTON) 25 MG tablet Take 1 tablet (25 mg total) by mouth daily.   empagliflozin (JARDIANCE) 10 MG TABS tablet Take 1 tablet (10 mg total) by mouth daily before breakfast.   fluticasone (FLONASE)  50 MCG/ACT nasal spray Place 2 sprays into both nostrils daily. (Patient taking differently: Place 2 sprays into both nostrils daily as needed for allergies or rhinitis (allergies.).)   glipiZIDE (GLUCOTROL) 5 MG tablet Take 0.5 tablets (2.5 mg total) by mouth 2 (two) times daily.   glucose blood test strip Use as instructed   liraglutide (VICTOZA) 18 MG/3ML SOPN Inject 0.6 mg into the skin daily for 7 days, THEN 1.2 mg daily.   lisinopril (ZESTRIL) 20 MG tablet Take 1 tablet (20 mg total) by mouth daily.   loratadine (CLARITIN) 10 MG tablet Take 1 tablet (10 mg total) by mouth daily.   Polyethyl Glycol-Propyl Glycol (SYSTANE) 0.4-0.3 % SOLN Place 1 drop into both eyes daily as needed (Dry eyes). (Patient not taking: Reported on 09/02/2021)   polyethylene glycol (MIRALAX) 17 g packet Take 17 g by mouth daily.   No facility-administered encounter medications on file as of 10/01/2021.    Patient Active Problem List   Diagnosis Date Noted   Nausea 09/24/2020   Status post total left knee replacement 07/30/2020   Fatigue 02/10/2019   Primary osteoarthritis of left knee 01/25/2019   Left carpal tunnel syndrome 01/25/2019   Cervical neuropathy 08/20/2018   Bilateral carpal tunnel syndrome 06/29/2018   Depressed mood 01/21/2018   Need for immunization against influenza 01/17/2018   Constipation 01/15/2016   Osteopenia 12/13/2013   Vaginal dryness 12/13/2013   Peripheral neuropathy 11/02/2013   Health care maintenance 11/17/2012   Arthritis of knee, right 08/16/2012   Breast cancer, right breast ([place]) 09/25/2010   COLONIC POLYPS, HX OF 07/19/2007   OBESITY NOS 07/09/2006   Type 2 diabetes mellitus  with renal manifestations ([place]) 03/10/2006   Hyperlipidemia 03/10/2006   Hypertension associated with diabetes ([place]) 03/10/2006   ALLERGIC RHINITIS 03/10/2006   Fatty liver 03/10/2006    Conditions to be addressed/monitored: HTN, HLD, and DMII  Care Plan : RN Care Manager Plan of Care   Updates made by [name], RN since 10/01/2021 12:00 AM     Problem: Health Promotion or Disease Self-Management (General Plan of Care)      Goal: Establish Plan of Care for Chronic Disease Management (DM2, HTN, HLD)   Start Date: 09/02/2021  Recent Progress: Not on track  Priority: High  Note:   Current Barriers: Successful outreach to patient this afternoon.  She shared she was able to pick up her Jardiance and Victoza.  Patient did note that the Voctoza did not have any needles with it and she needs a prescription for them.  Discussed with Dr. Jeanice Lim at the clinic and she will send to [facility].  Patient notes that her CBG readings for yesterday were 109 and in the evening it was 270.  She explained that she is aware why her evening meal was high, she ate potato chips and had a regular Pepsi. Patient to call community pharmacy to verify the prescription for the Victoza pen/needles are available.  Plan to follow up with patient in 4-6 weeks.  Chronic Disease Management support and education needs related to HTN, HLD, and DMII  Financial Constraints  Non-adherence to prescribed medication regimen Difficulty obtaining medicationsd RNCM Clinical Goal(s):  Patient will verbalize understanding of plan for management of HTN, HLD, and DMII as evidenced by discussions with provider and RNCM. take all medications exactly as prescribed and will call provider for medication related questions as evidenced by medication reviews with provider/RNCM. demonstrate Improved adherence to prescribed treatment plan for HTN, HLD, and DMII as evidenced by decrease in A1C. continue to work with RN Care Manager to address care management and care coordination needs related to  HTN, HLD, and DMII as evidenced by adherence to CM Team Scheduled appointments work with Education officer, museum to address  related to the management of Limited access to food and Medication procurement related to the management of HTN,  HLD, and DMII as evidenced by review of EMR and patient or Education officer, museum report through collaboration with Consulting civil engineer, provider, and care team.  Interventions: 1:1 collaboration with primary care provider regarding development and update of comprehensive plan of care as evidenced by provider attestation and co-signature Inter-disciplinary care team collaboration (see longitudinal plan of care) Evaluation of current treatment plan related to  self management and patient's adherence to plan as established by provider Diabetes Interventions:  (Status:  Goal on track:  NO.) Long Term Goal Assessed patient's understanding of A1c goal: <7% Provided education to patient about basic DM disease process Reviewed medications with patient and discussed importance of medication adherence Counseled on importance of regular laboratory monitoring as prescribed Discussed plans with patient for ongoing care management follow up and provided patient with direct contact information for care management team Referral made to pharmacy team for assistance with obtaining Jardiance. Review of patient status, including review of consultants reports, relevant laboratory and other test results, and medications completed Lab Results  Component Value Date   HGBA1C 11.4 (A) 08/08/2021  Hypertension Interventions:  (Status:  Goal on track:  Yes.) Long Term Goal Last practice recorded BP readings:  BP Readings from Last 3 Encounters:  09/19/21 121/60  09/05/21 121/65  08/08/21 119/63  Most recent eGFR/CrCl:  Lab Results  Component Value Date   EGFR 51 (L) 09/05/2021    No components found for: CRCL  Evaluation of current treatment plan related to hypertension self management and patient's adherence to plan as established by provider Reviewed medications with patient and discussed importance of compliance Discussed plans with patient for ongoing care management follow up and provided patient with direct contact  information for care management team Reviewed scheduled/upcoming provider appointments including:   Patient Goals/Self-Care Activities: Take all medications as prescribed Attend all scheduled provider appointments Call pharmacy for medication refills 3-7 days in advance of running out of medications Attend church or other social activities Perform all self care activities independently  Call provider office for new concerns or questions  Work with the social worker to address care coordination needs and will continue to work with the clinical team to address health care and disease management related needs  Follow Up Plan:  The patient has been provided with contact information for the care management team and has been advised to call with any health related questions or concerns.       Plan: The patient has been provided with contact information for the care management team and has been advised to call with any health related questions or concerns.   [name], RN, BSN, CCM Care Management Coordinator [facility] Internal Medicine Phone: [phone]: [phone]

## 2021-10-01 NOTE — Patient Instructions (Signed)
Visit Information  Thank you for taking time to visit with me today. Please don't hesitate to contact me if I can be of assistance to you before our next scheduled telephone appointment.  Our next appointment is by telephone on 11/11/21 at 1:00.  Please call the care guide team at [phone] if you need to cancel or reschedule your appointment.   If you are experiencing a Mental Health or [place] or need someone to talk to, please call the [place] National Suicide Prevention Lifeline: 785-717-8999 or TTY: 805-862-6717 TTY 715-380-9555) to talk to a trained counselor   The patient verbalized understanding of instructions, educational materials, and care plan provided today and agreed to receive a mailed copy of patient instructions, educational materials, and care plan.   The patient has been provided with contact information for the care management team and has been advised to call with any health related questions or concerns.   [name], RN, BSN, CCM Care Management Coordinator [facility] Internal Medicine Phone: [phone]: [phone]

## 2021-10-03 ENCOUNTER — Other Ambulatory Visit: Payer: Self-pay

## 2021-10-06 NOTE — Progress Notes (Signed)
Internal Medicine Clinic Attending ? ?Case discussed with Dr. Ariwodo  At the time of the visit.  We reviewed the resident?s history and exam and pertinent patient test results.  I agree with the assessment, diagnosis, and plan of care documented in the resident?s note.  ?

## 2021-10-17 ENCOUNTER — Ambulatory Visit (INDEPENDENT_AMBULATORY_CARE_PROVIDER_SITE_OTHER): Payer: Medicare Other | Admitting: Internal Medicine

## 2021-10-17 DIAGNOSIS — E1129 Type 2 diabetes mellitus with other diabetic kidney complication: Secondary | ICD-10-CM | POA: Diagnosis not present

## 2021-10-17 DIAGNOSIS — Z7984 Long term (current) use of oral hypoglycemic drugs: Secondary | ICD-10-CM | POA: Diagnosis not present

## 2021-10-17 DIAGNOSIS — E1159 Type 2 diabetes mellitus with other circulatory complications: Secondary | ICD-10-CM | POA: Diagnosis not present

## 2021-10-17 DIAGNOSIS — I152 Hypertension secondary to endocrine disorders: Secondary | ICD-10-CM

## 2021-10-17 DIAGNOSIS — E1121 Type 2 diabetes mellitus with diabetic nephropathy: Secondary | ICD-10-CM

## 2021-10-17 DIAGNOSIS — Z794 Long term (current) use of insulin: Secondary | ICD-10-CM | POA: Diagnosis not present

## 2021-10-17 NOTE — Progress Notes (Signed)
CC: routine clinic visit for DM2  HPI:  [name] is a 76 y.o. female with a PMHx stated below and presents today for stated above. Please see the Encounters tab for problem-based Assessment & Plan for additional details.   Past Medical History:  Diagnosis Date   Allergy    Anemia    Breast CA ([place])    (Rt) breast ca dx 4/12---  S/P RADICAL MASTECTOMY AND CHEMORADIATION   Diabetes mellitus ORAL MED   Fatty liver disease, nonalcoholic    History of hyperkalemia    Hx antineoplastic chemo  10/12 - 06/24/11   PACLITAX EL COMPLETED 06/24/11   Hyperlipidemia    Hypertension    Mild nonproliferative diabetic retinopathy of right eye ([place]) 03/19/11   Dr. Joseph Art   Numbness and tingling    Hx: of in fingers and toes since chemotherapy   OA (osteoarthritis) of knee    RIGHT LEG   Obesity    Renal lesion     Current Outpatient Medications on File Prior to Visit  Medication Sig Dispense Refill   Accu-Chek Softclix Lancets lancets Check blood sugar up to three times daily. 300 each 3   atorvastatin (LIPITOR) 40 MG tablet Take 1 tablet (40 mg total) by mouth daily at bedtime. 90 tablet 1   chlorthalidone (HYGROTON) 25 MG tablet Take 1 tablet (25 mg total) by mouth daily. 90 tablet 1   empagliflozin (JARDIANCE) 10 MG TABS tablet Take 1 tablet (10 mg total) by mouth daily before breakfast. 90 tablet 2   fluticasone (FLONASE) 50 MCG/ACT nasal spray Place 2 sprays into both nostrils daily. (Patient taking differently: Place 2 sprays into both nostrils daily as needed for allergies or rhinitis (allergies.).) 16 g 0   glipiZIDE (GLUCOTROL) 5 MG tablet Take 0.5 tablets (2.5 mg total) by mouth 2 (two) times daily. 90 tablet 2   glucose blood test strip Use as instructed 100 each 12   Insulin Pen Needle 32G X 4 MM MISC Use as directed with victoza 100 each 3   liraglutide (VICTOZA) 18 MG/3ML SOPN Inject 0.6 mg into the skin daily for 7 days, THEN 1.2 mg daily. 9 mL 0    lisinopril (ZESTRIL) 20 MG tablet Take 1 tablet (20 mg total) by mouth daily. 90 tablet 2   loratadine (CLARITIN) 10 MG tablet Take 1 tablet (10 mg total) by mouth daily. 90 tablet 0   Polyethyl Glycol-Propyl Glycol (SYSTANE) 0.4-0.3 % SOLN Place 1 drop into both eyes daily as needed (Dry eyes). (Patient not taking: Reported on 09/02/2021)     polyethylene glycol (MIRALAX) 17 g packet Take 17 g by mouth daily. 14 each 0   No current facility-administered medications on file prior to visit.    Family History  Problem Relation Age of Onset   Stroke Father    Cancer Sister        breast   Cancer Sister        COLON CANCER   Stroke Sister    Heart disease Brother     Social History   Socioeconomic History   Marital status: Married    Spouse name: Not on file   Number of children: 4   Years of education: 16   Highest education level: Not on file  Occupational History   Not on file  Tobacco Use   Smoking status: Never   Smokeless tobacco: Never  Vaping Use   Vaping Use: Never used  Substance and Sexual  Activity   Alcohol use: No    Alcohol/week: 0.0 standard drinks of alcohol   Drug use: No    Types: Flunitrazepam   Sexual activity: Not on file    Comment: MENARCHE AGE 72, G4, P4, PARITY AGE 76, HRT 20+ YEARS    Other Topics Concern   Not on file  Social History Narrative   Current Social History 01/14/2021        Patient lives with spouse in a home is 2 stories. There are 2 steps up to the entrance the patient uses with railings.      Patient's method of transportation is personal car.      The highest level of education was high school diploma.      The patient currently retired.      Identified important Relationships are "my family"      Pets : 1       Interests / Fun: "[employer]"       Current Stressors: "bills"      Religious / Personal Beliefs: [employer]       Social Determinants of Health   Financial Resource Strain: Not on file  Food Insecurity: Food  Insecurity Present (08/08/2021)   Hunger Vital Sign    Worried About Running Out of Food in the Last Year: Sometimes true    Ran Out of Food in the Last Year: Sometimes true  Transportation Needs: No Transportation Needs (08/08/2021)   PRAPARE - Hydrologist (Medical): No    Lack of Transportation (Non-Medical): No  Physical Activity: Not on file  Stress: Not on file  Social Connections: Not on file  Intimate Partner Violence: Not on file    Review of Systems: ROS negative except for what is noted on the assessment and plan.  Vitals:   10/17/21 1011  BP: (!) 105/54  Pulse: 92  Temp: 98.5 F (36.9 C)  TempSrc: Oral  SpO2: 99%  Weight: 153 lb 1.6 oz (69.4 kg)    Physical Exam: Constitutional: alert, well-appearing, in NAD Cardiovascular: RRR, no m/r/g, non-edematous bilateral LE Pulmonary/Chest: normal work of breathing on RA, LCTAB MSK: normal bulk and tone  Neurological: A&O x 3 and follows commands  Skin: warm and dry   Psych: normal behavior, normal affect    Assessment & Plan:   See Encounters Tab for problem based charting.  Patient discussed with Dr. Marzetta Board, MD  Internal Medicine Resident, PGY-1 [facility]

## 2021-10-17 NOTE — Assessment & Plan Note (Signed)
Well controlled with chlorthalidone 25 mg and Lisinopril 20 mg daily.

## 2021-10-17 NOTE — Assessment & Plan Note (Addendum)
1 month f/u for DM after home glucose readings were above range, ranging from 200-300's. She was started on Victoza 0.6 mg daily for 1 week and instructed to self-titrate up to 1.2 mg daily after 1 week. Glipizide 2.5 mg BID and Jardiance 10 mg daily were continued.   Reports good med compliance and is tolerating regimen well. She reports that she forgot to self-titrate Victoza up to 1.2 mg daily, and has been taking 0.6 mg daily since last visit. Home sugars much better controlled, ranging from 100-200's, with lowest being 98 and highest 224. She reports feeling much better compared to prior visit. She also reports increasing physical activity and cutting down on carbs.   -- Instructed to increase Victoza from 0.6 mg to 1.2 mg daily  -- Continue Glipizide 2.5 mg BID and Jardiance 10 mg daily -- Continue with home monitoring  -- Continue with lifestyle modifications  -- Follow up in 4 weeks for A1c

## 2021-10-17 NOTE — Patient Instructions (Signed)
Continue checking your sugars at home   Increase Victoza to 1.2 mg daily Please continue with Glipizide and Jardiance  We will see you in 1 month for A1c!

## 2021-10-18 NOTE — Progress Notes (Signed)
Internal Medicine Clinic Attending  Case discussed with Dr. Patel  At the time of the visit.  We reviewed the resident's history and exam and pertinent patient test results.  I agree with the assessment, diagnosis, and plan of care documented in the resident's note.  

## 2021-10-23 ENCOUNTER — Other Ambulatory Visit: Payer: Self-pay

## 2021-11-08 ENCOUNTER — Other Ambulatory Visit: Payer: Self-pay | Admitting: Student

## 2021-11-08 DIAGNOSIS — E785 Hyperlipidemia, unspecified: Secondary | ICD-10-CM

## 2021-11-11 ENCOUNTER — Telehealth: Payer: Medicare Other

## 2021-11-11 ENCOUNTER — Telehealth: Payer: Self-pay | Admitting: *Deleted

## 2021-11-11 NOTE — Chronic Care Management (AMB) (Signed)
  Care Coordination Note  11/11/2021 Name: JEANE CASHATT MRN: [id number] DOB: [birth year]  Tonita Cong is a 76 y.o. year old female who is a primary care patient of [name], Candace Gallus, MD and is actively engaged with the care management team. I reached out to Tonita Cong by phone today to assist with re-scheduling a follow up visit with the RN Case Manager  Follow up plan: Unsuccessful telephone outreach attempt made. A HIPAA compliant phone message was left for the patient providing contact information and requesting a return call.  The care management team will reach out to the patient again over the next 7 days.  If patient returns call to provider office, please advise to call [place] at [phone].  [place] Management  Direct Dial: [phone]

## 2021-11-14 ENCOUNTER — Other Ambulatory Visit: Payer: Self-pay

## 2021-11-14 ENCOUNTER — Ambulatory Visit (INDEPENDENT_AMBULATORY_CARE_PROVIDER_SITE_OTHER): Payer: Medicare Other | Admitting: Student

## 2021-11-14 ENCOUNTER — Encounter: Payer: Self-pay | Admitting: Student

## 2021-11-14 ENCOUNTER — Other Ambulatory Visit (HOSPITAL_COMMUNITY): Payer: Self-pay

## 2021-11-14 VITALS — BP 112/72 | HR 101 | Temp 98.2°F | Ht 59.0 in | Wt 154.4 lb

## 2021-11-14 DIAGNOSIS — Z794 Long term (current) use of insulin: Secondary | ICD-10-CM

## 2021-11-14 DIAGNOSIS — I152 Hypertension secondary to endocrine disorders: Secondary | ICD-10-CM

## 2021-11-14 DIAGNOSIS — Z7984 Long term (current) use of oral hypoglycemic drugs: Secondary | ICD-10-CM | POA: Diagnosis not present

## 2021-11-14 DIAGNOSIS — E1159 Type 2 diabetes mellitus with other circulatory complications: Secondary | ICD-10-CM | POA: Diagnosis not present

## 2021-11-14 DIAGNOSIS — Z0000 Encounter for general adult medical examination without abnormal findings: Secondary | ICD-10-CM

## 2021-11-14 DIAGNOSIS — E1121 Type 2 diabetes mellitus with diabetic nephropathy: Secondary | ICD-10-CM

## 2021-11-14 LAB — POCT GLYCOSYLATED HEMOGLOBIN (HGB A1C): Hemoglobin A1C: 8.2 % — AB (ref 4.0–5.6)

## 2021-11-14 LAB — GLUCOSE, CAPILLARY: Glucose-Capillary: 206 mg/dL — ABNORMAL HIGH (ref 70–99)

## 2021-11-14 MED ORDER — ZOSTER VAC RECOMB ADJUVANTED 50 MCG/0.5ML IM SUSR: 0.5000 mL | Freq: Once | INTRAMUSCULAR | 0 refills | Status: AC

## 2021-11-14 MED ORDER — LIRAGLUTIDE 18 MG/3ML ~~LOC~~ SOPN
1.2000 mg | PEN_INJECTOR | Freq: Every day | SUBCUTANEOUS | 3 refills | Status: DC
  Filled 2021-11-14: qty 6, 30d supply, fill #0
  Filled 2021-12-20: qty 6, 30d supply, fill #1

## 2021-11-14 NOTE — Patient Instructions (Signed)
Thank you, [name] for allowing [place] to provide your care today. Today we discussed .  Diabetes A1c has greatly improved great job! Keep up the good work! We will continue your victoza, jardiance, and glipizide.   Healthcare Maintenance    I will send in a prescription for shingles shot, please follow up with your pharmacy  I have ordered the following labs for you:   Lab Orders         Glucose, capillary         POC Hbg A1C      Referrals ordered today:   Referral Orders  No referral(s) requested today     I have ordered the following medication/changed the following medications:   Stop the following medications: There are no discontinued medications.   Start the following medications: No orders of the defined types were placed in this encounter.    Follow up: 3 months diabetes follow up    Should you have any questions or concerns please call the internal medicine clinic at [phone].    Sanjuana Letters, D.O. [place]

## 2021-11-14 NOTE — Assessment & Plan Note (Signed)
Assessment: Presents for follow-up of her type 2 diabetes.  Current regimen of Jardiance 10 mg daily, glipizide 5 mg daily and Victoza 1.2 mg daily.  Repeat A1c today of 8.3% from 11.4%.  Congratulated the patient on this and encouraged her to continue lifestyle modifications.  She is tolerating her medications well and brought in her glucometer which did not show any hypoglycemic episodes.  We will continue her on her current regimen.  Goal A1c of 7 to 8%.  Plan: -Continue Jardiance 10 mg daily, glipizide 5 mg daily and Victoza 1.2 mg daily -Follow-up A1c in 3 months

## 2021-11-14 NOTE — Assessment & Plan Note (Signed)
Blood pressure currently well controlled on chlorthalidone 25 mg daily and lisinopril 20 mg daily.  Continue this regimen.

## 2021-11-14 NOTE — Assessment & Plan Note (Addendum)
Discussed discussed shingles vaccine, patient is interested.  Wrote prescription for Shingrix and will have her follow-up with pharmacy.  We prescribe second dose in few months.

## 2021-11-14 NOTE — Progress Notes (Signed)
CC: Follow-up hypertension and diabetes  HPI:  [name] is a 76 y.o. female living with a history stated below and presents today for follow-up concerning her chronic medical conditions of type 2 diabetes and hypertension. Please see problem based assessment and plan for additional details.  Past Medical History:  Diagnosis Date   Allergy    Anemia    Breast CA ([place])    (Rt) breast ca dx 4/12---  S/P RADICAL MASTECTOMY AND CHEMORADIATION   Diabetes mellitus ORAL MED   Fatty liver disease, nonalcoholic    History of hyperkalemia    Hx antineoplastic chemo  10/12 - 06/24/11   PACLITAX EL COMPLETED 06/24/11   Hyperlipidemia    Hypertension    Mild nonproliferative diabetic retinopathy of right eye ([place]) 03/19/11   Dr. Joseph Art   Numbness and tingling    Hx: of in fingers and toes since chemotherapy   OA (osteoarthritis) of knee    RIGHT LEG   Obesity    Renal lesion     Current Outpatient Medications on File Prior to Visit  Medication Sig Dispense Refill   Accu-Chek Softclix Lancets lancets Check blood sugar up to three times daily. 300 each 3   atorvastatin (LIPITOR) 40 MG tablet Take 1 tablet (40 mg total) by mouth daily at bedtime. 90 tablet 1   chlorthalidone (HYGROTON) 25 MG tablet Take 1 tablet (25 mg total) by mouth daily. 90 tablet 1   empagliflozin (JARDIANCE) 10 MG TABS tablet Take 1 tablet (10 mg total) by mouth daily before breakfast. 90 tablet 2   fluticasone (FLONASE) 50 MCG/ACT nasal spray Place 2 sprays into both nostrils daily. (Patient taking differently: Place 2 sprays into both nostrils daily as needed for allergies or rhinitis (allergies.).) 16 g 0   glipiZIDE (GLUCOTROL) 5 MG tablet Take 0.5 tablets (2.5 mg total) by mouth 2 (two) times daily. 90 tablet 2   glucose blood test strip Use as instructed 100 each 12   Insulin Pen Needle 32G X 4 MM MISC Use as directed with victoza 100 each 3   lisinopril (ZESTRIL) 20 MG tablet Take 1 tablet (20  mg total) by mouth daily. 90 tablet 2   loratadine (CLARITIN) 10 MG tablet Take 1 tablet (10 mg total) by mouth daily. 90 tablet 0   Polyethyl Glycol-Propyl Glycol (SYSTANE) 0.4-0.3 % SOLN Place 1 drop into both eyes daily as needed (Dry eyes). (Patient not taking: Reported on 09/02/2021)     polyethylene glycol (MIRALAX) 17 g packet Take 17 g by mouth daily. 14 each 0   No current facility-administered medications on file prior to visit.    Family History  Problem Relation Age of Onset   Stroke Father    Cancer Sister        breast   Cancer Sister        COLON CANCER   Stroke Sister    Heart disease Brother     Social History   Socioeconomic History   Marital status: Married    Spouse name: Not on file   Number of children: 4   Years of education: 16   Highest education level: Not on file  Occupational History   Not on file  Tobacco Use   Smoking status: Never   Smokeless tobacco: Never  Vaping Use   Vaping Use: Never used  Substance and Sexual Activity   Alcohol use: No    Alcohol/week: 0.0 standard drinks of alcohol   Drug  use: No    Types: Flunitrazepam   Sexual activity: Not on file    Comment: MENARCHE AGE 82, G4, P4, PARITY AGE 24, HRT 20+ YEARS    Other Topics Concern   Not on file  Social History Narrative   Current Social History 01/14/2021        Patient lives with spouse in a home is 2 stories. There are 2 steps up to the entrance the patient uses with railings.      Patient's method of transportation is personal car.      The highest level of education was high school diploma.      The patient currently retired.      Identified important Relationships are "my family"      Pets : 1       Interests / Fun: "[employer]"       Current Stressors: "bills"      Religious / Personal Beliefs: Baptist       Social Determinants of Health   Financial Resource Strain: Not on file  Food Insecurity: Food Insecurity Present (08/08/2021)   Hunger Vital Sign     Worried About Running Out of Food in the Last Year: Sometimes true    Ran Out of Food in the Last Year: Sometimes true  Transportation Needs: No Transportation Needs (08/08/2021)   PRAPARE - Hydrologist (Medical): No    Lack of Transportation (Non-Medical): No  Physical Activity: Not on file  Stress: Not on file  Social Connections: Not on file  Intimate Partner Violence: Not on file    Review of Systems: ROS negative except for what is noted on the assessment and plan.  Vitals:   11/14/21 1024  BP: 112/72  Pulse: (!) 101  Temp: 98.2 F (36.8 C)  TempSrc: Oral  SpO2: 99%  Weight: 154 lb 6.4 oz (70 kg)  Height: '4\' 11"'$  (1.499 m)    Physical Exam: Constitutional:  in no acute distress HENT: normocephalic atraumatic Eyes: conjunctiva non-erythematous Neck: supple Cardiovascular: regular rate and rhythm, no m/r/g Pulmonary/Chest: normal work of breathing on room air, lungs clear to auscultation bilaterally MSK: normal bulk and tone Neurological: alert & oriented x 3 Skin: warm and dry Psych: Normal mood and thought process  Assessment & Plan:   Hypertension associated with diabetes ([place]) Blood pressure currently well controlled on chlorthalidone 25 mg daily and lisinopril 20 mg daily.  Continue this regimen.  Type 2 diabetes mellitus with renal manifestations (H[facility]) Assessment: Presents for follow-up of her type 2 diabetes.  Current regimen of Jardiance 10 mg daily, glipizide 5 mg daily and Victoza 1.2 mg daily.  Repeat A1c today of 8.3% from 11.4%.  Congratulated the patient on this and encouraged her to continue lifestyle modifications.  She is tolerating her medications well and brought in her glucometer which did not show any hypoglycemic episodes.  We will continue her on her current regimen.  Goal A1c of 7 to 8%.  Plan: -Continue Jardiance 10 mg daily, glipizide 5 mg daily and Victoza 1.2 mg daily -Follow-up A1c in 3 months  Health care  maintenance Discussed discussed shingles vaccine, patient is interested.  Wrote prescription for Shingrix and will have her follow-up with pharmacy.  We prescribe second dose in few months.  Patient discussed with Dr. Laurena Slimmer, D.O. [facility], PGY-3 Phone: [phone] Date 11/14/2021 Time 6:11 PM

## 2021-11-15 NOTE — Addendum Note (Signed)
Addended by: Lalla Brothers T on: 11/15/2021 10:27 AM   Modules accepted: Level of Service

## 2021-11-15 NOTE — Progress Notes (Signed)
Internal Medicine Clinic Attending  Case discussed with Dr. [name]  At the time of the visit.  We reviewed the resident's history and exam and pertinent patient test results.  I agree with the assessment, diagnosis, and plan of care documented in the resident's note.  

## 2021-11-19 NOTE — Chronic Care Management (AMB) (Signed)
  Care Coordination Note  11/19/2021 Name: [name] MRN: [id number] DOB: 01-20-1946  [name] is a 77 y.o. year old female who is a primary care patient of [name], Candace Gallus, MD and is actively engaged with the care management team. I reached out to [name] by phone today to assist with re-scheduling a follow up visit with the RN Case Manager  Follow up plan: Telephone appointment with care management team member scheduled for:11/20/21  [place]  Direct Dial: [phone]

## 2021-11-20 ENCOUNTER — Other Ambulatory Visit: Payer: Self-pay

## 2021-11-20 ENCOUNTER — Other Ambulatory Visit (HOSPITAL_COMMUNITY): Payer: Self-pay

## 2021-11-20 ENCOUNTER — Ambulatory Visit: Payer: Medicare Other

## 2021-11-20 NOTE — Chronic Care Management (AMB) (Signed)
Care Management    RN Visit Note  11/20/2021 Name: [name] MRN: [id number] DOB: September 01, 1945  Subjective: [name] is a 76 y.o. year old female who is a primary care patient of [name], Candace Gallus, MD. The care management team was consulted for assistance with disease management and care coordination needs.    Engaged with patient by telephone for follow up visit in response to provider referral for case management and/or care coordination services.   Consent to Services:   [name] was given information about Care Management services today including:  Care Management services includes personalized support from designated clinical staff supervised by her physician, including individualized plan of care and coordination with other care providers 24/7 contact phone numbers for assistance for urgent and routine care needs. The patient may stop case management services at any time by phone call to the office staff.  Patient agreed to services and consent obtained.   Assessment: Review of patient past medical history, allergies, medications, health status, including review of consultants reports, laboratory and other test data, was performed as part of comprehensive evaluation and provision of chronic care management services.   SDOH (Social Determinants of Health) assessments and interventions performed:  SDOH Interventions    Flowsheet Row Most Recent Value  SDOH Interventions   Financial Strain Interventions Intervention Not Indicated        Care Plan  Allergies  Allergen Reactions   Hctz [Hydrochlorothiazide] Other (See Comments)    Pancreatitis    Outpatient Encounter Medications as of 11/20/2021  Medication Sig   Accu-Chek Softclix Lancets lancets Check blood sugar up to three times daily.   atorvastatin (LIPITOR) 40 MG tablet Take 1 tablet (40 mg total) by mouth daily at bedtime.   chlorthalidone (HYGROTON) 25 MG tablet Take 1 tablet (25 mg total) by  mouth daily.   empagliflozin (JARDIANCE) 10 MG TABS tablet Take 1 tablet (10 mg total) by mouth daily before breakfast.   fluticasone (FLONASE) 50 MCG/ACT nasal spray Place 2 sprays into both nostrils daily. (Patient taking differently: Place 2 sprays into both nostrils daily as needed for allergies or rhinitis (allergies.).)   glipiZIDE (GLUCOTROL) 5 MG tablet Take 0.5 tablets (2.5 mg total) by mouth 2 (two) times daily.   glucose blood test strip Use as instructed   Insulin Pen Needle 32G X 4 MM MISC Use as directed with victoza   liraglutide (VICTOZA) 18 MG/3ML SOPN Inject 1.2 mg into the skin daily.   lisinopril (ZESTRIL) 20 MG tablet Take 1 tablet (20 mg total) by mouth daily.   loratadine (CLARITIN) 10 MG tablet Take 1 tablet (10 mg total) by mouth daily.   Polyethyl Glycol-Propyl Glycol (SYSTANE) 0.4-0.3 % SOLN Place 1 drop into both eyes daily as needed (Dry eyes). (Patient not taking: Reported on 09/02/2021)   polyethylene glycol (MIRALAX) 17 g packet Take 17 g by mouth daily.   No facility-administered encounter medications on file as of 11/20/2021.    Patient Active Problem List   Diagnosis Date Noted   Nausea 09/24/2020   Status post total left knee replacement 07/30/2020   Fatigue 02/10/2019   Primary osteoarthritis of left knee 01/25/2019   Left carpal tunnel syndrome 01/25/2019   Cervical neuropathy 08/20/2018   Bilateral carpal tunnel syndrome 06/29/2018   Depressed mood 01/21/2018   Need for immunization against influenza 01/17/2018   Constipation 01/15/2016   Osteopenia 12/13/2013   Vaginal dryness 12/13/2013   Peripheral neuropathy 11/02/2013   Health care maintenance  11/17/2012   Arthritis of knee, right 08/16/2012   Breast cancer, right breast ([place]) 09/25/2010   COLONIC POLYPS, HX OF 07/19/2007   OBESITY NOS 07/09/2006   Type 2 diabetes mellitus with renal manifestations ([place]) 03/10/2006   Hyperlipidemia 03/10/2006   Hypertension associated with diabetes ([place])  03/10/2006   ALLERGIC RHINITIS 03/10/2006   Fatty liver 03/10/2006    Conditions to be addressed/monitored: HTN, HLD, and DMII  Care Plan : RN Care Manager Plan of Care  Updates made by [name], RN since 11/20/2021 12:00 AM     Problem: Health Promotion or Disease Self-Management (General Plan of Care)      Goal: Establish Plan of Care for Chronic Disease Management (DM2, HTN, HLD) Completed 11/20/2021  Start Date: 09/02/2021  Recent Progress: Not on track  Priority: High  Note:   Current Barriers: Successful outreach to patient this afternoon.   Her A1C at last office visit a few weeks ago is 8.2 with the goal of having it 8 or below based on PCP's notes.   Patient has difficulty getting to the [facility] so provided her with the phone number to call them to set up delivery via [place].  Patient stable and care coordination closed.  Chronic Disease Management support and education needs related to HTN, HLD, and DMII  Financial Constraints  Non-adherence to prescribed medication regimen Difficulty obtaining medicationsd RNCM Clinical Goal(s):  Patient will verbalize understanding of plan for management of HTN, HLD, and DMII as evidenced by discussions with provider and RNCM. take all medications exactly as prescribed and will call provider for medication related questions as evidenced by medication reviews with provider/RNCM. demonstrate Improved adherence to prescribed treatment plan for HTN, HLD, and DMII as evidenced by decrease in A1C. continue to work with RN Care Manager to address care management and care coordination needs related to  HTN, HLD, and DMII as evidenced by adherence to CM Team Scheduled appointments work with Education officer, museum to address  related to the management of Limited access to food and Medication procurement related to the management of HTN, HLD, and DMII as evidenced by review of EMR and patient or Education officer, museum report through collaboration  with Consulting civil engineer, provider, and care team.  Interventions: 1:1 collaboration with primary care provider regarding development and update of comprehensive plan of care as evidenced by provider attestation and co-signature Inter-disciplinary care team collaboration (see longitudinal plan of care) Evaluation of current treatment plan related to  self management and patient's adherence to plan as established by provider Diabetes Interventions:  (Status:  Goal on track:  Yes.) Long Term Goal Assessed patient's understanding of A1c goal: <8% Provided education to patient about basic DM disease process Reviewed medications with patient and discussed importance of medication adherence Counseled on importance of regular laboratory monitoring as prescribed Discussed plans with patient for ongoing care management follow up and provided patient with direct contact information for care management team Referral made to pharmacy team for assistance with obtaining Jardiance. Review of patient status, including review of consultants reports, relevant laboratory and other test results, and medications completed Lab Results  Component Value Date   HGBA1C 8.2 (A) 11/14/2021  Hypertension Interventions:  (Status:  Goal on track:  Yes.) Long Term Goal Last practice recorded BP readings:  BP Readings from Last 3 Encounters:  11/14/21 112/72  10/17/21 (!) 105/54  09/19/21 121/60  Most recent eGFR/CrCl:  Lab Results  Component Value Date   EGFR 51 (L) 09/05/2021  No components found for: "CRCL"  Evaluation of current treatment plan related to hypertension self management and patient's adherence to plan as established by provider Reviewed medications with patient and discussed importance of compliance Discussed plans with patient for ongoing care management follow up and provided patient with direct contact information for care management team Reviewed scheduled/upcoming provider appointments including:    Patient Goals/Self-Care Activities: Take all medications as prescribed Attend all scheduled provider appointments Call pharmacy for medication refills 3-7 days in advance of running out of medications Attend church or other social activities Perform all self care activities independently  Call provider office for new concerns or questions  Work with the social worker to address care coordination needs and will continue to work with the clinical team to address health care and disease management related needs  Follow Up Plan:  The patient has been provided with contact information for the care management team and has been advised to call with any health related questions or concerns.       Plan: The patient has been provided with contact information for the care management team and has been advised to call with any health related questions or concerns.   [name], RN, BSN, CCM Care Management Coordinator [facility] Phone: [phone]: [phone]

## 2021-11-24 ENCOUNTER — Other Ambulatory Visit: Payer: Self-pay | Admitting: Student

## 2021-11-24 DIAGNOSIS — E785 Hyperlipidemia, unspecified: Secondary | ICD-10-CM

## 2021-11-25 ENCOUNTER — Other Ambulatory Visit (HOSPITAL_COMMUNITY): Payer: Self-pay

## 2021-12-02 ENCOUNTER — Other Ambulatory Visit: Payer: Self-pay | Admitting: Internal Medicine

## 2021-12-02 DIAGNOSIS — I152 Hypertension secondary to endocrine disorders: Secondary | ICD-10-CM

## 2021-12-11 ENCOUNTER — Other Ambulatory Visit: Payer: Self-pay

## 2021-12-11 ENCOUNTER — Encounter: Payer: Self-pay | Admitting: Internal Medicine

## 2021-12-11 ENCOUNTER — Ambulatory Visit (INDEPENDENT_AMBULATORY_CARE_PROVIDER_SITE_OTHER): Payer: Medicare Other | Admitting: Internal Medicine

## 2021-12-11 DIAGNOSIS — J069 Acute upper respiratory infection, unspecified: Secondary | ICD-10-CM

## 2021-12-11 MED ORDER — FLUTICASONE PROPIONATE 50 MCG/ACT NA SUSP: 1.0000 | Freq: Every day | NASAL | 2 refills | Status: AC

## 2021-12-11 NOTE — Progress Notes (Signed)
CC: cough  HPI:  [name] is a 76 y.o. female living with a history stated below and presents today for a cough. Please see problem based assessment and plan for additional details.  Past Medical History:  Diagnosis Date   Allergy    Anemia    Breast CA ([place])    (Rt) breast ca dx 4/12---  S/P RADICAL MASTECTOMY AND CHEMORADIATION   Diabetes mellitus ORAL MED   Fatty liver disease, nonalcoholic    History of hyperkalemia    Hx antineoplastic chemo  10/12 - 06/24/11   PACLITAX EL COMPLETED 06/24/11   Hyperlipidemia    Hypertension    Mild nonproliferative diabetic retinopathy of right eye ([place]) 03/19/11   Dr. Joseph Art   Numbness and tingling    Hx: of in fingers and toes since chemotherapy   OA (osteoarthritis) of knee    RIGHT LEG   Obesity    Renal lesion     Current Outpatient Medications on File Prior to Visit  Medication Sig Dispense Refill   Accu-Chek Softclix Lancets lancets Check blood sugar up to three times daily. 300 each 3   atorvastatin (LIPITOR) 40 MG tablet TAKE 1 TABLET BY MOUTH EVERYDAY AT BEDTIME 90 tablet 1   chlorthalidone (HYGROTON) 25 MG tablet TAKE 1 TABLET (25 MG TOTAL) BY MOUTH DAILY. 90 tablet 1   empagliflozin (JARDIANCE) 10 MG TABS tablet Take 1 tablet (10 mg total) by mouth daily before breakfast. 90 tablet 2   glipiZIDE (GLUCOTROL) 5 MG tablet Take 0.5 tablets (2.5 mg total) by mouth 2 (two) times daily. 90 tablet 2   glucose blood test strip Use as instructed 100 each 12   Insulin Pen Needle 32G X 4 MM MISC Use as directed with victoza 100 each 3   liraglutide (VICTOZA) 18 MG/3ML SOPN Inject 1.2 mg into the skin daily. 9 mL 3   lisinopril (ZESTRIL) 20 MG tablet Take 1 tablet (20 mg total) by mouth daily. 90 tablet 2   loratadine (CLARITIN) 10 MG tablet Take 1 tablet (10 mg total) by mouth daily. 90 tablet 0   Polyethyl Glycol-Propyl Glycol (SYSTANE) 0.4-0.3 % SOLN Place 1 drop into both eyes daily as needed (Dry eyes). (Patient  not taking: Reported on 09/02/2021)     polyethylene glycol (MIRALAX) 17 g packet Take 17 g by mouth daily. 14 each 0   No current facility-administered medications on file prior to visit.    Family History  Problem Relation Age of Onset   Stroke Father    Cancer Sister        breast   Cancer Sister        COLON CANCER   Stroke Sister    Heart disease Brother     Social History   Socioeconomic History   Marital status: Married    Spouse name: Not on file   Number of children: 4   Years of education: 16   Highest education level: Not on file  Occupational History   Not on file  Tobacco Use   Smoking status: Never   Smokeless tobacco: Never  Vaping Use   Vaping Use: Never used  Substance and Sexual Activity   Alcohol use: No    Alcohol/week: 0.0 standard drinks of alcohol   Drug use: No    Types: Flunitrazepam   Sexual activity: Not on file    Comment: MENARCHE AGE 51, G4, P4, PARITY AGE 71, HRT 20+ YEARS    Other Topics  Concern   Not on file  Social History Narrative   Current Social History 01/14/2021        Patient lives with spouse in a home is 2 stories. There are 2 steps up to the entrance the patient uses with railings.      Patient's method of transportation is personal car.      The highest level of education was high school diploma.      The patient currently retired.      Identified important Relationships are "my family"      Pets : 1       Interests / Fun: "[employer]"       Current Stressors: "bills"      Religious / Personal Beliefs: Baptist       Social Determinants of Health   Financial Resource Strain: Medium Risk (11/20/2021)   Overall Financial Resource Strain (CARDIA)    Difficulty of Paying Living Expenses: Somewhat hard  Food Insecurity: Food Insecurity Present (08/08/2021)   Hunger Vital Sign    Worried About Running Out of Food in the Last Year: Sometimes true    Ran Out of Food in the Last Year: Sometimes true  Transportation Needs:  No Transportation Needs (08/08/2021)   PRAPARE - Hydrologist (Medical): No    Lack of Transportation (Non-Medical): No  Physical Activity: Not on file  Stress: Not on file  Social Connections: Not on file  Intimate Partner Violence: Not on file    Review of Systems: ROS negative except for what is noted on the assessment and plan.  Vitals:   12/11/21 1314  BP: 120/73  Pulse: 98  Temp: 98.5 F (36.9 C)  TempSrc: Oral  SpO2: 100%  Weight: 152 lb 4.8 oz (69.1 kg)  Height: '4\' 11"'$  (1.499 m)    Physical Exam: Constitutional: well-appearing elderly female sitting in chair, in no acute distress HENT: normocephalic atraumatic, mucous membranes moist, TM normal bilaterally, cerumen impaction Eyes: conjunctiva non-erythematous, EOMI, PERRL Cardiovascular: regular rate and rhythm, no m/r/g Pulmonary/Chest: normal work of breathing on room air, lungs clear to auscultation bilaterally Neurological: alert & oriented x 3, no focal deficit Skin: warm and dry Psych: normal mood and behavior  Assessment & Plan:     Patient discussed with Dr. Angelia Mould  Viral URI The patient presents to the [facility] to discuss a dry cough and nasal drainage that has been going on for 1 week.  She was seen at her dentist's office earlier this week and was told to come see her PCP as she may need antibiotics. As of today, her cough is already improving, however, she states that it sometimes wakes her up from her sleep. She is not coughing anything up. Her husband has similar symptoms at home, however his symptoms are more mild.  The patient denies any fevers, chills, headache, sinus pain, chest pain, shortness of breath, or wheezing.  She has been using over-the-counter throat lozenges and a vaporizer at home, but has not tried any other home remedies.  Her symptoms are consistent with a viral upper respiratory infection and I counseled the patient that antibiotics would not be appropriate  treatment for this.  The patient did not test herself for COVID.  Plan: - Counseled patient on supportive care measures including using mucinex, Neti pot and Flonase spray, Tylenol as needed, throat lozenges, and her vaporizer - Provided patient with information on upper respiratory infections - Advised patient to call if her  symptoms worsen or do not improve in 1 week     Paysen Goza, D.O. [facility], PGY-2 Phone: [phone] Date 12/11/2021 Time 1:49 PM

## 2021-12-11 NOTE — Assessment & Plan Note (Addendum)
The patient presents to the [facility] to discuss a dry cough and nasal drainage that has been going on for 1 week.  She was seen at her dentist's office earlier this week and was told to come see her PCP as she may need antibiotics. As of today, her cough is already improving, however, she states that it sometimes wakes her up from her sleep. She is not coughing anything up. Her husband has similar symptoms at home, however his symptoms are more mild.  The patient denies any fevers, chills, headache, sinus pain, chest pain, shortness of breath, or wheezing.  She has been using over-the-counter throat lozenges and a vaporizer at home, but has not tried any other home remedies.  Her symptoms are consistent with a viral upper respiratory infection and I counseled the patient that antibiotics would not be appropriate treatment for this.  The patient did not test herself for COVID.  Plan: - Counseled patient on supportive care measures including using mucinex, Neti pot and Flonase spray, Tylenol as needed, throat lozenges, and her vaporizer - Provided patient with information on upper respiratory infections - Advised patient to call if her symptoms worsen or do not improve in 1 week

## 2021-12-11 NOTE — Patient Instructions (Signed)
Thank you, [name] for allowing [place] to provide your care today. Today we discussed:  Viral upper respiratory infection:  Rest and drink plenty of water/fluids Use a humidifier or run a hot shower to create steam 3-4 times a day for approximately 10 minutes at a time (This helps lessen congestion)  Use salt water sprays (saline sprays) as directed Avoid tobacco smoke and other environmental irritants Use throat lozenges as needed (any over the counter brand) Use Neti Pot to clean your sinuses out and then flonase spray Use tylenol/ibuprofen as needed for fevers or body aches  I have ordered the following labs for you:  Lab Orders  No laboratory test(s) ordered today      Referrals ordered today:   Referral Orders  No referral(s) requested today     I have ordered the following medication/changed the following medications:   Stop the following medications: There are no discontinued medications.   Start the following medications: No orders of the defined types were placed in this encounter.    Follow up:  if symptoms fail to improve      Should you have any questions or concerns please call the internal medicine clinic at [phone].     Buddy Duty, D.O. [place]

## 2021-12-13 NOTE — Progress Notes (Signed)
Internal Medicine Clinic Attending  Case discussed with the resident at the time of the visit.  We reviewed the resident's history and exam and pertinent patient test results.  I agree with the assessment, diagnosis, and plan of care documented in the resident's note.  

## 2021-12-20 ENCOUNTER — Other Ambulatory Visit: Payer: Self-pay

## 2021-12-20 ENCOUNTER — Other Ambulatory Visit: Payer: Self-pay | Admitting: Student

## 2021-12-20 ENCOUNTER — Other Ambulatory Visit (HOSPITAL_COMMUNITY): Payer: Self-pay

## 2021-12-20 DIAGNOSIS — E1121 Type 2 diabetes mellitus with diabetic nephropathy: Secondary | ICD-10-CM

## 2021-12-20 DIAGNOSIS — E1159 Type 2 diabetes mellitus with other circulatory complications: Secondary | ICD-10-CM

## 2021-12-27 ENCOUNTER — Other Ambulatory Visit: Payer: Self-pay

## 2021-12-31 ENCOUNTER — Other Ambulatory Visit (HOSPITAL_COMMUNITY): Payer: Self-pay

## 2022-01-14 ENCOUNTER — Other Ambulatory Visit (HOSPITAL_COMMUNITY): Payer: Self-pay

## 2022-01-22 DIAGNOSIS — C50911 Malignant neoplasm of unspecified site of right female breast: Secondary | ICD-10-CM | POA: Diagnosis not present

## 2022-01-28 ENCOUNTER — Other Ambulatory Visit (HOSPITAL_COMMUNITY): Payer: Self-pay

## 2022-02-05 DIAGNOSIS — C50911 Malignant neoplasm of unspecified site of right female breast: Secondary | ICD-10-CM | POA: Diagnosis not present

## 2022-02-14 ENCOUNTER — Ambulatory Visit: Payer: Self-pay

## 2022-02-14 NOTE — Patient Outreach (Signed)
  Care Coordination   02/14/2022 Name: [name] MRN: [id number] DOB: 07/08/1945   Care Coordination Outreach Attempts:  An unsuccessful telephone outreach was attempted today to offer the patient information about available care coordination services as a benefit of their health plan.   Follow Up Plan:  Additional outreach attempts will be made to offer the patient care coordination information and services.   Encounter Outcome:  No Answer  Care Coordination Interventions Activated:  No   Care Coordination Interventions:  No, not indicated    [name], RN, BSN, CCM Care Management Coordinator [facility] Phone: [phone]: [phone]

## 2022-02-19 ENCOUNTER — Ambulatory Visit (INDEPENDENT_AMBULATORY_CARE_PROVIDER_SITE_OTHER): Payer: Medicare Other | Admitting: Student

## 2022-02-19 ENCOUNTER — Other Ambulatory Visit: Payer: Self-pay

## 2022-02-19 ENCOUNTER — Other Ambulatory Visit (HOSPITAL_COMMUNITY): Payer: Self-pay

## 2022-02-19 ENCOUNTER — Encounter: Payer: Self-pay | Admitting: Student

## 2022-02-19 VITALS — BP 127/68 | HR 89 | Temp 98.6°F | Ht 59.0 in | Wt 153.4 lb

## 2022-02-19 DIAGNOSIS — E1121 Type 2 diabetes mellitus with diabetic nephropathy: Secondary | ICD-10-CM

## 2022-02-19 DIAGNOSIS — I152 Hypertension secondary to endocrine disorders: Secondary | ICD-10-CM

## 2022-02-19 DIAGNOSIS — Z0000 Encounter for general adult medical examination without abnormal findings: Secondary | ICD-10-CM

## 2022-02-19 DIAGNOSIS — E1159 Type 2 diabetes mellitus with other circulatory complications: Secondary | ICD-10-CM

## 2022-02-19 DIAGNOSIS — Z7984 Long term (current) use of oral hypoglycemic drugs: Secondary | ICD-10-CM | POA: Diagnosis not present

## 2022-02-19 DIAGNOSIS — Z23 Encounter for immunization: Secondary | ICD-10-CM | POA: Diagnosis not present

## 2022-02-19 DIAGNOSIS — E785 Hyperlipidemia, unspecified: Secondary | ICD-10-CM

## 2022-02-19 LAB — POCT GLYCOSYLATED HEMOGLOBIN (HGB A1C): Hemoglobin A1C: 9.4 % — AB (ref 4.0–5.6)

## 2022-02-19 LAB — GLUCOSE, CAPILLARY: Glucose-Capillary: 310 mg/dL — ABNORMAL HIGH (ref 70–99)

## 2022-02-19 MED ORDER — LIRAGLUTIDE 18 MG/3ML ~~LOC~~ SOPN: 0.6000 mg | PEN_INJECTOR | Freq: Every day | SUBCUTANEOUS | 3 refills | Status: DC

## 2022-02-19 MED ORDER — METFORMIN HCL ER 500 MG PO TB24: 500.0000 mg | ORAL_TABLET | Freq: Every day | ORAL | 1 refills | Status: DC

## 2022-02-19 MED ORDER — INSULIN PEN NEEDLE 32G X 4 MM MISC: 3 refills | Status: DC

## 2022-02-19 NOTE — Patient Instructions (Signed)
Thank you, [name] for allowing [place] to provide your care today. Today we discussed .  High blood pressure Your blood pressure is doing great today, please continue your medicines  Diabetes We will be starting you on victoza, please give yourself .'6mg'$  for 4 weeks and  then increase to 1.2 mg daily. Please continue metformin daily, jardiance, and glipizide. Please continue to work on your diet and start going back to the [facility]  I have ordered the following labs for you:   Lab Orders         Glucose, capillary         POC Hbg A1C       Referrals ordered today:   Referral Orders  No referral(s) requested today     I have ordered the following medication/changed the following medications:   Stop the following medications: Medications Discontinued During This Encounter  Medication Reason   Insulin Pen Needle 32G X 4 MM MISC Reorder   liraglutide (VICTOZA) 18 MG/3ML SOPN Reorder     Start the following medications: Meds ordered this encounter  Medications   Insulin Pen Needle 32G X 4 MM MISC    Sig: Use as directed with victoza    Dispense:  100 each    Refill:  3   liraglutide (VICTOZA) 18 MG/3ML SOPN    Sig: Inject 0.6 mg into the skin daily. Please inject .'6mg'$  daily for 4 weeks and increase to 1.2 mg daily    Dispense:  9 mL    Refill:  3   metFORMIN (GLUCOPHAGE-XR) 500 MG 24 hr tablet    Sig: Take 1 tablet (500 mg total) by mouth daily with breakfast.    Dispense:  90 tablet    Refill:  1     Follow up: 3 months    Should you have any questions or concerns please call the internal medicine clinic at [phone].    Sanjuana Letters, D.O. [employer]

## 2022-02-19 NOTE — Progress Notes (Signed)
CC: Follow up T2DM  HPI:  [name] is a 76 y.o. female living with a history stated below and presents today for follow up for her type 2 diabetes. Please see problem based assessment and plan for additional details.  Past Medical History:  Diagnosis Date   Allergy    Anemia    Breast CA ([place])    (Rt) breast ca dx 4/12---  S/P RADICAL MASTECTOMY AND CHEMORADIATION   Diabetes mellitus ORAL MED   Fatty liver disease, nonalcoholic    History of hyperkalemia    Hx antineoplastic chemo  10/12 - 06/24/11   PACLITAX EL COMPLETED 06/24/11   Hyperlipidemia    Hypertension    Mild nonproliferative diabetic retinopathy of right eye ([place]) 03/19/11   Dr. Joseph Art   Numbness and tingling    Hx: of in fingers and toes since chemotherapy   OA (osteoarthritis) of knee    RIGHT LEG   Obesity    Renal lesion     Current Outpatient Medications on File Prior to Visit  Medication Sig Dispense Refill   Accu-Chek Softclix Lancets lancets Check blood sugar up to three times daily. 300 each 3   atorvastatin (LIPITOR) 40 MG tablet TAKE 1 TABLET BY MOUTH EVERYDAY AT BEDTIME 90 tablet 1   chlorthalidone (HYGROTON) 25 MG tablet TAKE 1 TABLET (25 MG TOTAL) BY MOUTH DAILY. 90 tablet 1   empagliflozin (JARDIANCE) 10 MG TABS tablet Take 1 tablet (10 mg total) by mouth daily before breakfast. 90 tablet 2   fluticasone (FLONASE) 50 MCG/ACT nasal spray Place 1 spray into both nostrils daily. 18.2 mL 2   glipiZIDE (GLUCOTROL) 5 MG tablet Take 0.5 tablets (2.5 mg total) by mouth 2 (two) times daily. 90 tablet 2   glucose blood test strip Use as instructed 100 each 12   lisinopril (ZESTRIL) 20 MG tablet TAKE 1 TABLET BY MOUTH EVERY DAY 90 tablet 2   loratadine (CLARITIN) 10 MG tablet Take 1 tablet (10 mg total) by mouth daily. 90 tablet 0   Polyethyl Glycol-Propyl Glycol (SYSTANE) 0.4-0.3 % SOLN Place 1 drop into both eyes daily as needed (Dry eyes). (Patient not taking: Reported on 09/02/2021)      polyethylene glycol (MIRALAX) 17 g packet Take 17 g by mouth daily. 14 each 0   No current facility-administered medications on file prior to visit.    Family History  Problem Relation Age of Onset   Stroke Father    Cancer Sister        breast   Cancer Sister        COLON CANCER   Stroke Sister    Heart disease Brother     Social History   Socioeconomic History   Marital status: Married    Spouse name: Not on file   Number of children: 4   Years of education: 16   Highest education level: Not on file  Occupational History   Not on file  Tobacco Use   Smoking status: Never   Smokeless tobacco: Never  Vaping Use   Vaping Use: Never used  Substance and Sexual Activity   Alcohol use: No    Alcohol/week: 0.0 standard drinks of alcohol   Drug use: No    Types: Flunitrazepam   Sexual activity: Not on file    Comment: MENARCHE AGE 41, G4, P4, PARITY AGE 38, HRT 20+ YEARS    Other Topics Concern   Not on file  Social History Narrative  Current Social History 01/14/2021        Patient lives with spouse in a home is 2 stories. There are 2 steps up to the entrance the patient uses with railings.      Patient's method of transportation is personal car.      The highest level of education was high school diploma.      The patient currently retired.      Identified important Relationships are "my family"      Pets : 1       Interests / Fun: "[employer]"       Current Stressors: "bills"      Religious / Personal Beliefs: [employer]       Social Determinants of Health   Financial Resource Strain: Medium Risk (11/20/2021)   Overall Financial Resource Strain (CARDIA)    Difficulty of Paying Living Expenses: Somewhat hard  Food Insecurity: Food Insecurity Present (08/08/2021)   Hunger Vital Sign    Worried About Running Out of Food in the Last Year: Sometimes true    Ran Out of Food in the Last Year: Sometimes true  Transportation Needs: No Transportation Needs (08/08/2021)    PRAPARE - Hydrologist (Medical): No    Lack of Transportation (Non-Medical): No  Physical Activity: Not on file  Stress: Not on file  Social Connections: Not on file  Intimate Partner Violence: Not on file    Review of Systems: ROS negative except for what is noted on the assessment and plan.  Vitals:   02/19/22 1416  BP: 127/68  Pulse: 89  Temp: 98.6 F (37 C)  TempSrc: Oral  SpO2: 100%  Weight: 153 lb 6.4 oz (69.6 kg)  Height: '4\' 11"'$  (1.499 m)    Physical Exam: Constitutional: no acute distress HENT: normocephalic atraumatic Cardiovascular: regular rate and rhythm, no m/r/g Pulmonary/Chest: normal work of breathing on room air, lungs clear to auscultation bilaterally MSK: normal bulk and tone Neurological: alert & oriented x 3 Skin: warm and dry Psych: normal mood  Assessment & Plan:   Hypertension associated with diabetes (HCC) Assessment: BP well controlled on regimen of chlorthalidone 25 mg daily and lisinopril 20 mg daily  Plan: -continue lisinopril and chlorthalidone as per above  Type 2 diabetes mellitus with renal manifestations (HCC) Assessment: Currently prescribed jardiance 10 mg daily, victoza, and glipizide 5 mg BID. However on further conversation patient has been taking insulin rather than victoza.   Will start back victoza and metformin with increase in A1c today from 8.2% to 9.4%. Discussed a healthy diet of complex carbs, decreasing soda's, and decreasing sweets. She will try to join the [facility] again for continued exercise.  Plan: -continue jardiance 10 mg daily, and glipizide 5 mg BID -start metformin 500 mg xr daily and victoza .6 -start victoza .6 mg and up-titrate -follow up A1c in 3 months  Need for immunization against influenza Flu vaccine given today  Hyperlipidemia Continue atorvastatin 40 mg daily  Health care maintenance Last visit patient given prescription for 1st shingles vaccine, after she  felt poorly and does not want to follow up with subsequent shot.   Patient discussed with Dr. Laurena Slimmer, D.O. [facility], PGY-3 Phone: [phone] Date 02/19/2022 Time 7:24 PM

## 2022-02-19 NOTE — Assessment & Plan Note (Signed)
Assessment: Currently prescribed jardiance 10 mg daily, victoza, and glipizide 5 mg BID. However on further conversation patient has been taking insulin rather than victoza.   Will start back victoza and metformin with increase in A1c today from 8.2% to 9.4%. Discussed a healthy diet of complex carbs, decreasing soda's, and decreasing sweets. She will try to join the [facility] again for continued exercise.  Plan: -continue jardiance 10 mg daily, and glipizide 5 mg BID -start metformin 500 mg xr daily and victoza .6 -start victoza .6 mg and up-titrate -follow up A1c in 3 months

## 2022-02-19 NOTE — Assessment & Plan Note (Signed)
Flu vaccine given today. 

## 2022-02-19 NOTE — Assessment & Plan Note (Signed)
Continue atorvastatin 40 mg daily.  

## 2022-02-19 NOTE — Assessment & Plan Note (Signed)
Assessment: BP well controlled on regimen of chlorthalidone 25 mg daily and lisinopril 20 mg daily  Plan: -continue lisinopril and chlorthalidone as per above

## 2022-02-19 NOTE — Assessment & Plan Note (Signed)
Last visit patient given prescription for 1st shingles vaccine, after she felt poorly and does not want to follow up with subsequent shot.

## 2022-02-20 ENCOUNTER — Telehealth: Payer: Self-pay

## 2022-02-20 NOTE — Patient Outreach (Signed)
  Care Coordination   02/20/2022 Name: [name] MRN: [id number] DOB: January 09, 1946   Care Coordination Outreach Attempts:  A second unsuccessful outreach was attempted today to offer the patient with information about available care coordination services as a benefit of their health plan.     Follow Up Plan:  Additional outreach attempts will be made to offer the patient care coordination information and services.   Encounter Outcome:  No Answer  Care Coordination Interventions Activated:  No   Care Coordination Interventions:  No, not indicated    [name], RN, BSN, CCM Care Management Coordinator [facility] Phone: [phone]: [phone]

## 2022-02-20 NOTE — Progress Notes (Signed)
Internal Medicine Clinic Attending  Case discussed with Dr. [name]  At the time of the visit.  We reviewed the resident's history and exam and pertinent patient test results.  I agree with the assessment, diagnosis, and plan of care documented in the resident's note.  

## 2022-02-24 ENCOUNTER — Other Ambulatory Visit: Payer: Self-pay

## 2022-02-24 DIAGNOSIS — E1121 Type 2 diabetes mellitus with diabetic nephropathy: Secondary | ICD-10-CM

## 2022-02-24 MED ORDER — LIRAGLUTIDE 18 MG/3ML ~~LOC~~ SOPN
0.6000 mg | PEN_INJECTOR | Freq: Every day | SUBCUTANEOUS | 3 refills | Status: DC
  Filled 2022-02-24: qty 9, 60d supply, fill #0

## 2022-02-24 NOTE — Telephone Encounter (Signed)
RTC to patient was restarted on Metformin is still having the Diarrhea. Did not take again.   Patient stated only took the additional 1 time.  Would like to get a refill on her Victoza sent to [facility].

## 2022-02-24 NOTE — Telephone Encounter (Signed)
Requesting to speak with a nurse about  metFORMIN (GLUCOPHAGE-XR) 500 MG 24 hr tablet and liraglutide (VICTOZA) 18 MG/3ML SOPN. Please call pt back.

## 2022-02-25 ENCOUNTER — Encounter: Payer: Self-pay | Admitting: Student

## 2022-02-25 ENCOUNTER — Other Ambulatory Visit (HOSPITAL_COMMUNITY): Payer: Self-pay

## 2022-02-26 ENCOUNTER — Emergency Department (HOSPITAL_COMMUNITY): Payer: Medicare Other

## 2022-02-26 ENCOUNTER — Encounter (HOSPITAL_COMMUNITY): Payer: Self-pay | Admitting: Emergency Medicine

## 2022-02-26 ENCOUNTER — Emergency Department (HOSPITAL_COMMUNITY)
Admission: EM | Admit: 2022-02-26 | Discharge: 2022-02-26 | Disposition: A | Discharge status: Home / Self Care | Payer: Medicare Other | Attending: Emergency Medicine | Admitting: Emergency Medicine

## 2022-02-26 ENCOUNTER — Other Ambulatory Visit: Payer: Self-pay

## 2022-02-26 DIAGNOSIS — R109 Unspecified abdominal pain: Secondary | ICD-10-CM | POA: Insufficient documentation

## 2022-02-26 DIAGNOSIS — R911 Solitary pulmonary nodule: Secondary | ICD-10-CM

## 2022-02-26 DIAGNOSIS — I82890 Acute embolism and thrombosis of other specified veins: Secondary | ICD-10-CM

## 2022-02-26 DIAGNOSIS — E876 Hypokalemia: Secondary | ICD-10-CM | POA: Diagnosis not present

## 2022-02-26 DIAGNOSIS — K5730 Diverticulosis of large intestine without perforation or abscess without bleeding: Secondary | ICD-10-CM | POA: Diagnosis not present

## 2022-02-26 DIAGNOSIS — N3000 Acute cystitis without hematuria: Secondary | ICD-10-CM

## 2022-02-26 LAB — CBC WITH DIFFERENTIAL/PLATELET
Abs Immature Granulocytes: 0.01 10*3/uL (ref 0.00–0.07)
Basophils Absolute: 0 10*3/uL (ref 0.0–0.1)
Basophils Relative: 1 %
Eosinophils Absolute: 0.1 10*3/uL (ref 0.0–0.5)
Eosinophils Relative: 1 %
HCT: 34.5 % — ABNORMAL LOW (ref 36.0–46.0)
Hemoglobin: 12 g/dL (ref 12.0–15.0)
Immature Granulocytes: 0 %
Lymphocytes Relative: 47 %
Lymphs Abs: 2.4 10*3/uL (ref 0.7–4.0)
MCH: 32.9 pg (ref 26.0–34.0)
MCHC: 34.8 g/dL (ref 30.0–36.0)
MCV: 94.5 fL (ref 80.0–100.0)
Monocytes Absolute: 0.3 10*3/uL (ref 0.1–1.0)
Monocytes Relative: 6 %
Neutro Abs: 2.3 10*3/uL (ref 1.7–7.7)
Neutrophils Relative %: 45 %
Platelets: 193 10*3/uL (ref 150–400)
RBC: 3.65 MIL/uL — ABNORMAL LOW (ref 3.87–5.11)
RDW: 15.3 % (ref 11.5–15.5)
WBC: 5.1 10*3/uL (ref 4.0–10.5)
nRBC: 0 % (ref 0.0–0.2)

## 2022-02-26 LAB — URINALYSIS, ROUTINE W REFLEX MICROSCOPIC
Bilirubin Urine: NEGATIVE
Glucose, UA: NEGATIVE mg/dL
Hgb urine dipstick: NEGATIVE
Ketones, ur: NEGATIVE mg/dL
Nitrite: NEGATIVE
Protein, ur: NEGATIVE mg/dL
Specific Gravity, Urine: 1.006 (ref 1.005–1.030)
pH: 6 (ref 5.0–8.0)

## 2022-02-26 LAB — COMPREHENSIVE METABOLIC PANEL
ALT: 28 U/L (ref 0–44)
AST: 24 U/L (ref 15–41)
Albumin: 4.2 g/dL (ref 3.5–5.0)
Alkaline Phosphatase: 84 U/L (ref 38–126)
Anion gap: 11 (ref 5–15)
BUN: 16 mg/dL (ref 8–23)
CO2: 26 mmol/L (ref 22–32)
Calcium: 10 mg/dL (ref 8.9–10.3)
Chloride: 101 mmol/L (ref 98–111)
Creatinine, Ser: 1.07 mg/dL — ABNORMAL HIGH (ref 0.44–1.00)
GFR, Estimated: 55 mL/min — ABNORMAL LOW (ref >60)
Glucose, Bld: 214 mg/dL — ABNORMAL HIGH (ref 70–99)
Potassium: 3.1 mmol/L — ABNORMAL LOW (ref 3.5–5.1)
Sodium: 138 mmol/L (ref 135–145)
Total Bilirubin: 0.3 mg/dL (ref 0.3–1.2)
Total Protein: 6.8 g/dL (ref 6.5–8.1)

## 2022-02-26 LAB — LIPASE, BLOOD: Lipase: 70 U/L — ABNORMAL HIGH (ref 11–51)

## 2022-02-26 MED ORDER — CEPHALEXIN 500 MG PO CAPS: 500.0000 mg | ORAL_CAPSULE | Freq: Two times a day (BID) | ORAL | 0 refills | Status: AC

## 2022-02-26 MED ORDER — IOHEXOL 350 MG/ML SOLN
75.0000 mL | Freq: Once | INTRAVENOUS | Status: AC | PRN
  Administered 2022-02-26: 75 mL via INTRAVENOUS

## 2022-02-26 MED ORDER — SODIUM CHLORIDE 0.9 % IV SOLN
1.0000 g | Freq: Once | INTRAVENOUS | Status: AC
  Administered 2022-02-26: 1 g via INTRAVENOUS
  Filled 2022-02-26: qty 10

## 2022-02-26 MED ORDER — FENTANYL CITRATE PF 50 MCG/ML IJ SOSY
25.0000 ug | PREFILLED_SYRINGE | Freq: Once | INTRAMUSCULAR | Status: AC
  Administered 2022-02-26: 25 ug via INTRAVENOUS
  Filled 2022-02-26: qty 1

## 2022-02-26 MED ORDER — POTASSIUM CHLORIDE CRYS ER 20 MEQ PO TBCR
40.0000 meq | EXTENDED_RELEASE_TABLET | Freq: Once | ORAL | Status: AC
  Administered 2022-02-26: 40 meq via ORAL
  Filled 2022-02-26: qty 2

## 2022-02-26 MED ORDER — POTASSIUM CHLORIDE 10 MEQ/100ML IV SOLN
10.0000 meq | INTRAVENOUS | Status: DC
  Administered 2022-02-26: 10 meq via INTRAVENOUS
  Filled 2022-02-26: qty 100

## 2022-02-26 NOTE — ED Provider Triage Note (Signed)
Emergency Medicine Provider Triage Evaluation Note  [name] , a 76 y.o. female  was evaluated in triage.  Pt complains of right sided abdominal/flank pain for the past week.  She denies urinary symptoms, nausea, vomiting, or diarrhea.  No fever/chills.  Hx tubal ligation and hysterectomy in the past.  Review of Systems  Positive: Abdominal pain/flank pain Negative: Fever, vomiting  Physical Exam  BP 133/68 (BP Location: Left Arm)   Pulse 77   Temp 98.1 F (36.7 C) (Oral)   Resp 16   SpO2 99%  Gen:   Awake, no distress   Resp:  Normal effort  MSK:   Moves extremities without difficulty  Other:    Medical Decision Making  Medically screening exam initiated at 6:02 AM.  Appropriate orders placed.  [name] was informed that the remainder of the evaluation will be completed by another provider, this initial triage assessment does not replace that evaluation, and the importance of remaining in the ED until their evaluation is complete.  Right sided abdominal/flank pain x 1 week.  Labs, CT.   Larene Pickett, PA-C 02/26/22 [phone]

## 2022-02-26 NOTE — ED Provider Notes (Signed)
[facility] EMERGENCY DEPARTMENT Provider Note   CSN: [id number] Arrival date & time: 02/26/22  0555     History  Chief Complaint  Patient presents with   Flank Pain    [name] is a 76 y.o. female.  HPI 76 year old female presents presenting with right flank pain x1 week.  Pain is persistent, sharp and stabbing.  No aggravating or alleviating factors.  No known fevers or chills.  She reports she has been having ongoing issues with diarrhea but has actually been having normal bowel movements over the last week or so.  Denies any blood in her stool.  Denies any dysuria or hematuria.  Denies any history of kidney stones.  No nausea or vomiting.  She states that she will have pain in the back that will radiate to her abdomen and sometimes she will feel it in her hips.  Denies any falls or injuries.  She has a history of a hysterectomy and bilateral oophorectomy. Home Medications Prior to Admission medications   Medication Sig Start Date End Date Taking? Authorizing Provider  cephALEXin (KEFLEX) 500 MG capsule Take 1 capsule (500 mg total) by mouth 2 (two) times daily for 7 days. 02/26/22 03/05/22 Yes Garald Balding, PA-C  Accu-Chek Softclix Lancets lancets Check blood sugar up to three times daily. 02/21/21   Timothy Lasso, MD  atorvastatin (LIPITOR) 40 MG tablet TAKE 1 TABLET BY MOUTH EVERYDAY AT BEDTIME 11/25/21   [name], Vasilios, MD  chlorthalidone (HYGROTON) 25 MG tablet TAKE 1 TABLET (25 MG TOTAL) BY MOUTH DAILY. 12/02/21   Riesa Pope, MD  empagliflozin (JARDIANCE) 10 MG TABS tablet Take 1 tablet (10 mg total) by mouth daily before breakfast. 09/19/21 06/16/22  Timothy Lasso, MD  fluticasone (FLONASE) 50 MCG/ACT nasal spray Place 1 spray into both nostrils daily. 12/11/21   Atway, Rayann N, DO  glipiZIDE (GLUCOTROL) 5 MG tablet Take 0.5 tablets (2.5 mg total) by mouth 2 (two) times daily. 09/05/21   Timothy Lasso, MD  glucose blood test  strip Use as instructed 02/21/21   Timothy Lasso, MD  Insulin Pen Needle 32G X 4 MM MISC Use as directed with victoza 02/19/22   Riesa Pope, MD  liraglutide (VICTOZA) 18 MG/3ML SOPN Please inject into the skin 0.'6mg'$  daily for 4 weeks and increase to 1.2 mg daily 02/24/22 02/19/23  [name], Candace Gallus, MD  lisinopril (ZESTRIL) 20 MG tablet TAKE 1 TABLET BY MOUTH EVERY DAY 12/20/21   [name], Vasilios, MD  loratadine (CLARITIN) 10 MG tablet Take 1 tablet (10 mg total) by mouth daily. 09/19/21 12/18/21  Timothy Lasso, MD  metFORMIN (GLUCOPHAGE-XR) 500 MG 24 hr tablet Take 1 tablet (500 mg total) by mouth daily with breakfast. 02/19/22   [name], Vasilios, MD  Polyethyl Glycol-Propyl Glycol (SYSTANE) 0.4-0.3 % SOLN Place 1 drop into both eyes daily as needed (Dry eyes). Patient not taking: Reported on 09/02/2021    [provider]  polyethylene glycol (MIRALAX) 17 g packet Take 17 g by mouth daily. 09/05/21   Timothy Lasso, MD      Allergies    Hydrochlorothiazide    Review of Systems   Review of Systems Ten systems reviewed and are negative for acute change, except as noted in the HPI.   Physical Exam Updated Vital Signs BP 129/76 (BP Location: Left Arm)   Pulse 71   Temp 97.8 F (36.6 C) (Oral)   Resp 18   SpO2 100%  Physical Exam Vitals and nursing note reviewed.  Constitutional:      General: She is not in acute distress.    Appearance: She is well-developed.  HENT:     Head: Normocephalic and atraumatic.  Eyes:     Conjunctiva/sclera: Conjunctivae normal.  Cardiovascular:     Rate and Rhythm: Normal rate and regular rhythm.     Heart sounds: No murmur heard. Pulmonary:     Effort: Pulmonary effort is normal. No respiratory distress.     Breath sounds: Normal breath sounds.  Abdominal:     Palpations: Abdomen is soft.     Tenderness: There is no abdominal tenderness. There is right CVA tenderness.     Comments: Right sided CVA tenderness and  RLQ tenderness.    Musculoskeletal:        General: Tenderness present. No swelling.     Cervical back: Neck supple.     Comments: No midline tenderness to C, T, or Lspine. 5/5 strength in upper and lower extremities bilaterally   Skin:    General: Skin is warm and dry.     Capillary Refill: Capillary refill takes less than 2 seconds.  Neurological:     Mental Status: She is alert.  Psychiatric:        Mood and Affect: Mood normal.     ED Results / Procedures / Treatments   Labs (all labs ordered are listed, but only abnormal results are displayed) Labs Reviewed  CBC WITH DIFFERENTIAL/PLATELET - Abnormal; Notable for the following components:      Result Value   RBC 3.65 (*)    HCT 34.5 (*)    All other components within normal limits  COMPREHENSIVE METABOLIC PANEL - Abnormal; Notable for the following components:   Potassium 3.1 (*)    Glucose, Bld 214 (*)    Creatinine, Ser 1.07 (*)    GFR, Estimated 55 (*)    All other components within normal limits  LIPASE, BLOOD - Abnormal; Notable for the following components:   Lipase 70 (*)    All other components within normal limits  URINALYSIS, ROUTINE W REFLEX MICROSCOPIC - Abnormal; Notable for the following components:   Color, Urine STRAW (*)    Leukocytes,Ua MODERATE (*)    Bacteria, UA RARE (*)    All other components within normal limits  URINE CULTURE    EKG None  Radiology CT ABDOMEN PELVIS W CONTRAST  Result Date: 02/26/2022 CLINICAL DATA:  Right flank pain EXAM: CT ABDOMEN AND PELVIS WITH CONTRAST TECHNIQUE: Multidetector CT imaging of the abdomen and pelvis was performed using the standard protocol following bolus administration of intravenous contrast. RADIATION DOSE REDUCTION: This exam was performed according to the departmental dose-optimization program which includes automated exposure control, adjustment of the mA and/or kV according to patient size and/or use of iterative reconstruction technique.  CONTRAST:  52m OMNIPAQUE IOHEXOL 350 MG/ML SOLN COMPARISON:  CT AP 07/01/2016, FINDINGS: Lower chest: There is a 6 x 5 mm cavitary lung nodule at the left lung base, unchanged from prior exam. There is an additional partly cystic nodule of the left lung base measuring 7 x 7 mm (series 5, image 12), unchanged from prior exam. These nodules are unchanged compared to 2018 and likely benign. No imaging follow up is needed given 2 year stability. Hepatobiliary: Liver has a normal contour. No focal liver lesions are visualized. No evidence of perihepatic fluid. No evidence of intra or extrahepatic biliary ductal dilatation. The portal veins are contrast opacified. The gallbladder is normal in appearance without evidence  of cholelithiasis. Pancreas: There is no evidence of pancreatic ductal dilatation. The common bile duct not dilated. Spleen: Normal in size without focal abnormality. Adrenals/Urinary Tract: Adrenal glands are unremarkable. Kidneys are normal, without renal calculi, focal lesion, or hydronephrosis. Bladder is unremarkable. Stomach/Bowel: Stomach is within normal limits. Appendix appears normal. No evidence of bowel wall thickening, distention, or inflammatory changes. Diverticulosis without diverticulitis. Vascular/Lymphatic: There is thrombus in the right ovarian vein (series 3, image 33-35). There is a 2.5 x 1.2 cm hypodense lesion in the left adnexa (series 3, image 51), unchanged compared to 2018. Reproductive: Status post bilateral hysterectomy and oophorectomy. Other: No abdominal wall hernia or abnormality. No abdominopelvic ascites. Musculoskeletal: No acute or significant osseous findings. IMPRESSION: 1. Right ovarian vein thrombus. 2. No evidence of nephrolithiasis or hydronephrosis. Electronically Signed   By: Marin Roberts M.D.   On: 02/26/2022 13:05    Procedures Procedures    Medications Ordered in ED Medications  potassium chloride 10 mEq in 100 mL IVPB (10 mEq Intravenous New  Bag/Given 02/26/22 1355)  cefTRIAXone (ROCEPHIN) 1 g in sodium chloride 0.9 % 100 mL IVPB (1 g Intravenous New Bag/Given 02/26/22 1417)  iohexol (OMNIPAQUE) 350 MG/ML injection 75 mL (75 mLs Intravenous Contrast Given 02/26/22 1242)  potassium chloride SA (KLOR-CON M) CR tablet 40 mEq (40 mEq Oral Given 02/26/22 1353)  fentaNYL (SUBLIMAZE) injection 25 mcg (25 mcg Intravenous Given 02/26/22 1418)    ED Course/ Medical Decision Making/ A&P                           Medical Decision Making Amount and/or Complexity of Data Reviewed Labs: ordered.  Risk Prescription drug management.   INITIAL IMPRESSION / ASSESSMENT AND PLAN / ED COURSE   Pertinent labs & imaging results that were available during my care of the patient were reviewed by me and considered in my medical decision making (see chart for details).   This patient is Presenting for Evaluation of right flank pain which does require a range of treatment options, and is a complaint that involves a high risk of morbidity and mortality.   The Differential Diagnoses includes but is not exclusive to musculoskeletal strain, pyelonephritis, renal stone, less likely aortic dissection, appendicitis     I did obtain Additional Historical Information from chart review    Clinical Laboratory Tests ordered, reviewed, and interpreted by me   CBC leukocytosis, hemoglobin stable.    CMP with hypokalemia 3.1, mildly elevated creatinine 1.07 however 5 months ago this was higher at 1.14, lipase elevated at 70 but not consistent with pancreatitis, UA with moderate leukocytes and rare bacteria and 6-10 WBCs.   Radiologic Tests Ordered,  I independently interpreted the images and agree with radiology interpretation.    Noted right ovarian vein thrombus.  She also has a unchanged cavitary lung nodule in the left lung, unchanged from prior imaging.  Per radiology, benign and does not need any further follow-up given no changes.    Social  Determinants of Health Risk patient lives at home with her husband   Consult complete with Dr. Harolyn Rutherford  w/ GYN.  Per review, no indication for treatment of ovarian thrombus for treatment as this is likely chronic after her hysterectomy.   Medical Decision Making: Summary:  76 year old female presenting with flank pain x1 week.  On arrival she is well-appearing, no acute distress, resting comfortably in ER bed.  Vitals are reassuring.  Lab work overall unremarkable, no  leukocytosis, hemoglobin stable, creatinine slightly elevated but proved from prior not consistent with AKI.  Her CT of the abdomen shows a right ovarian thrombus, which per discussion with OB/GYN does not indicate any treatment and is likely been chronic.  No indication for anticoagulation or further work-up for other coagulopathies.  Her UA shows moderate leukocytes and 6-10 WBCs, with rare bacteria.  Question possible UTI versus pyelonephritis versus musculoskeletal strain.  The patient's potassium was repleted as her K was 3.1 here today.  She was also given a dose of Rocephin for UTI and was given fentanyl for pain as she reports a rash with Tylenol.  Pain improved.  Urine sent for culture. Will send home with Keflex.    Reevaluation with update and discussion with patient, pain improved.   Considered admission however no indication given discussion with GYN and no evidence of sepsis from UTI Disposition: Discharge with antibiotics, PCP followup  and return precautions   Case discussed with Dr. Doren Custard who is agreeable to the above plan and disposition   Final Clinical Impression(s) / ED Diagnoses Final diagnoses:  None    Rx / DC Orders ED Discharge Orders          Ordered    cephALEXin (KEFLEX) 500 MG capsule  2 times daily        02/26/22 1437              Garald Balding, PA-C 02/26/22 1441    Godfrey Pick, MD 03/01/22 2122

## 2022-02-26 NOTE — ED Triage Notes (Signed)
Pt c/o right sided flank pain x 1 week. Denies injury, no nausea/vomiting or urinary symptoms.

## 2022-02-26 NOTE — Discharge Instructions (Addendum)
You were evaluated in the Emergency Department and after careful evaluation, we did not find any emergent condition requiring admission or further testing in the hospital.  CT scan shows a clot in your ovarian vein, however as discussed there is no indication for treatment of this as this is likely chronic.  Your urine shows signs of a possible urinary tract infection, please continue to take antibiotics as directed until finished.  We have sent this off for culture and if we need to change the antibiotic our pharmacy will call you for this.  Please make sure to follow-up with your primary care doctor.  Your CT scan also showed a pulmonary nodule in the left lung however this is unchanged from your prior scans and there is no indication for further follow-up as this is likely benign.  Please return to the Emergency Department if you experience any worsening of your condition.  Thank you for allowing [place] to be a part of your care.

## 2022-02-28 LAB — URINE CULTURE

## 2022-03-10 ENCOUNTER — Other Ambulatory Visit (HOSPITAL_COMMUNITY): Payer: Self-pay

## 2022-03-27 ENCOUNTER — Other Ambulatory Visit: Payer: Self-pay | Admitting: Internal Medicine

## 2022-04-21 ENCOUNTER — Telehealth: Payer: Self-pay

## 2022-04-21 NOTE — Patient Outreach (Signed)
  Care Coordination   04/21/2022 Name: [name] MRN: [id number] DOB: December 01, 1945   Care Coordination Outreach Attempts:  An unsuccessful telephone outreach was attempted today to offer the patient information about available care coordination services as a benefit of their health plan.   Follow Up Plan:  Additional outreach attempts will be made to offer the patient care coordination information and services.   Encounter Outcome:  No Answer   Care Coordination Interventions:  No, not indicated    Jone Baseman, RN, MSN [place] [facility] Care Management Coordinator Direct Line [phone]

## 2022-05-06 ENCOUNTER — Telehealth: Payer: Self-pay

## 2022-05-06 NOTE — Patient Outreach (Signed)
  Care Coordination   05/06/2022 Name: [name] MRN: [id number] DOB: 03/19/1946   Care Coordination Outreach Attempts:  A second unsuccessful outreach was attempted today to offer the patient with information about available care coordination services as a benefit of their health plan.     Follow Up Plan:  Additional outreach attempts will be made to offer the patient care coordination information and services.   Encounter Outcome:  No Answer   Care Coordination Interventions:  No, not indicated    Jone Baseman, RN, MSN [place] [facility] Care Management Coordinator Direct Line [phone]

## 2022-05-12 ENCOUNTER — Other Ambulatory Visit: Payer: Self-pay | Admitting: Student

## 2022-05-12 DIAGNOSIS — Z1231 Encounter for screening mammogram for malignant neoplasm of breast: Secondary | ICD-10-CM

## 2022-05-19 ENCOUNTER — Other Ambulatory Visit: Payer: Self-pay | Admitting: Student

## 2022-05-19 DIAGNOSIS — E785 Hyperlipidemia, unspecified: Secondary | ICD-10-CM

## 2022-05-20 ENCOUNTER — Telehealth: Payer: Self-pay

## 2022-05-20 NOTE — Patient Outreach (Signed)
  Care Coordination   05/20/2022 Name: [name] MRN: [id number] DOB: [birth year]   Care Coordination Outreach Attempts:  A third unsuccessful outreach was attempted today to offer the patient with information about available care coordination services as a benefit of their health plan.   Follow Up Plan:  No further outreach attempts will be made at this time. We have been unable to contact the patient to offer or enroll patient in care coordination services  Encounter Outcome:  No Answer   Care Coordination Interventions:  No, not indicated    Jone Baseman, RN, MSN [place] [facility] Care Management Coordinator Direct Line [phone]

## 2022-05-27 ENCOUNTER — Other Ambulatory Visit (HOSPITAL_COMMUNITY): Payer: Self-pay

## 2022-05-27 ENCOUNTER — Encounter: Payer: Self-pay | Admitting: Student

## 2022-05-27 ENCOUNTER — Ambulatory Visit (INDEPENDENT_AMBULATORY_CARE_PROVIDER_SITE_OTHER): Payer: Medicare Other | Admitting: Student

## 2022-05-27 VITALS — BP 123/56 | HR 77 | Temp 98.1°F | Ht 59.0 in | Wt 156.6 lb

## 2022-05-27 DIAGNOSIS — E1121 Type 2 diabetes mellitus with diabetic nephropathy: Secondary | ICD-10-CM

## 2022-05-27 DIAGNOSIS — I152 Hypertension secondary to endocrine disorders: Secondary | ICD-10-CM | POA: Diagnosis not present

## 2022-05-27 DIAGNOSIS — Z7984 Long term (current) use of oral hypoglycemic drugs: Secondary | ICD-10-CM

## 2022-05-27 DIAGNOSIS — E1159 Type 2 diabetes mellitus with other circulatory complications: Secondary | ICD-10-CM | POA: Diagnosis not present

## 2022-05-27 LAB — POCT GLYCOSYLATED HEMOGLOBIN (HGB A1C): Hemoglobin A1C: 11.8 % — AB (ref 4.0–5.6)

## 2022-05-27 LAB — GLUCOSE, CAPILLARY: Glucose-Capillary: 351 mg/dL — ABNORMAL HIGH (ref 70–99)

## 2022-05-27 MED ORDER — TRULICITY 0.75 MG/0.5ML ~~LOC~~ SOAJ
0.7500 mg | SUBCUTANEOUS | 3 refills | Status: DC
  Filled 2022-05-27 – 2022-06-23 (×2): qty 2, 28d supply, fill #0
  Filled 2022-07-22: qty 2, 28d supply, fill #1
  Filled 2022-08-20: qty 2, 28d supply, fill #2

## 2022-05-27 MED ORDER — EMPAGLIFLOZIN 10 MG PO TABS
10.0000 mg | ORAL_TABLET | Freq: Every day | ORAL | 2 refills | Status: DC
  Filled 2022-05-27 – 2022-06-23 (×2): qty 90, 90d supply, fill #0
  Filled 2022-09-16: qty 90, 90d supply, fill #1
  Filled 2022-12-15: qty 90, 90d supply, fill #2

## 2022-05-27 MED ORDER — INSULIN PEN NEEDLE 32G X 4 MM MISC
3 refills | Status: DC
  Filled 2022-05-27: qty 100, 90d supply, fill #0

## 2022-05-27 NOTE — Assessment & Plan Note (Signed)
Assessment: BP regimen of lisinopril 20 mg daily and chlorthalidone 25 mg daily. Repeat BP well controlled and at goal 123/56.   Plan: - continue lisinopril 20 mg and chlorthalidone 25 mg daily

## 2022-05-27 NOTE — Progress Notes (Signed)
CC: follow up diabetes  HPI:  [name] is a 77 y.o. female living with a history stated below and presents today for follow up of her diabetes. Please see problem based assessment and plan for additional details.  Past Medical History:  Diagnosis Date   Allergy    Anemia    Breast CA ([place])    (Rt) breast ca dx 4/12---  S/P RADICAL MASTECTOMY AND CHEMORADIATION   Diabetes mellitus ORAL MED   Fatty liver disease, nonalcoholic    History of hyperkalemia    Hx antineoplastic chemo  10/12 - 06/24/11   PACLITAX EL COMPLETED 06/24/11   Hyperlipidemia    Hypertension    Mild nonproliferative diabetic retinopathy of right eye ([place]) 03/19/11   Dr. Joseph Art   Numbness and tingling    Hx: of in fingers and toes since chemotherapy   OA (osteoarthritis) of knee    RIGHT LEG   Obesity    Renal lesion     Current Outpatient Medications on File Prior to Visit  Medication Sig Dispense Refill   Accu-Chek Softclix Lancets lancets Check blood sugar up to three times daily. 300 each 3   atorvastatin (LIPITOR) 40 MG tablet TAKE 1 TABLET BY MOUTH EVERYDAY AT BEDTIME 90 tablet 1   chlorthalidone (HYGROTON) 25 MG tablet TAKE 1 TABLET (25 MG TOTAL) BY MOUTH DAILY. 90 tablet 1   fluticasone (FLONASE) 50 MCG/ACT nasal spray Place 1 spray into both nostrils daily. 18.2 mL 2   glipiZIDE (GLUCOTROL) 5 MG tablet Take 0.5 tablets (2.5 mg total) by mouth 2 (two) times daily. 90 tablet 2   glucose blood test strip Use as instructed 100 each 12   lisinopril (ZESTRIL) 20 MG tablet TAKE 1 TABLET BY MOUTH EVERY DAY 90 tablet 2   loratadine (CLARITIN) 10 MG tablet Take 1 tablet (10 mg total) by mouth daily. 90 tablet 0   metFORMIN (GLUCOPHAGE-XR) 500 MG 24 hr tablet Take 1 tablet (500 mg total) by mouth daily with breakfast. 90 tablet 1   Polyethyl Glycol-Propyl Glycol (SYSTANE) 0.4-0.3 % SOLN Place 1 drop into both eyes daily as needed (Dry eyes). (Patient not taking: Reported on 09/02/2021)      polyethylene glycol (MIRALAX) 17 g packet Take 17 g by mouth daily. 14 each 0   No current facility-administered medications on file prior to visit.    Family History  Problem Relation Age of Onset   Stroke Father    Cancer Sister        breast   Cancer Sister        COLON CANCER   Stroke Sister    Heart disease Brother     Social History   Socioeconomic History   Marital status: Married    Spouse name: Not on file   Number of children: 4   Years of education: 16   Highest education level: Not on file  Occupational History   Not on file  Tobacco Use   Smoking status: Never   Smokeless tobacco: Never  Vaping Use   Vaping Use: Never used  Substance and Sexual Activity   Alcohol use: No    Alcohol/week: 0.0 standard drinks of alcohol   Drug use: No    Types: Flunitrazepam   Sexual activity: Not on file    Comment: MENARCHE AGE 74, G4, P4, PARITY AGE 30, HRT 20+ YEARS    Other Topics Concern   Not on file  Social History Narrative   Current  Social History 01/14/2021        Patient lives with spouse in a home is 2 stories. There are 2 steps up to the entrance the patient uses with railings.      Patient's method of transportation is personal car.      The highest level of education was high school diploma.      The patient currently retired.      Identified important Relationships are "my family"      Pets : 1       Interests / Fun: "[employer]"       Current Stressors: "bills"      Religious / Personal Beliefs: [employer]       Social Determinants of Health   Financial Resource Strain: Medium Risk (11/20/2021)   Overall Financial Resource Strain (CARDIA)    Difficulty of Paying Living Expenses: Somewhat hard  Food Insecurity: Food Insecurity Present (08/08/2021)   Hunger Vital Sign    Worried About Running Out of Food in the Last Year: Sometimes true    Ran Out of Food in the Last Year: Sometimes true  Transportation Needs: No Transportation Needs (08/08/2021)    PRAPARE - Hydrologist (Medical): No    Lack of Transportation (Non-Medical): No  Physical Activity: Not on file  Stress: Not on file  Social Connections: Not on file  Intimate Partner Violence: Not on file    Review of Systems: ROS negative except for what is noted on the assessment and plan.  Vitals:   05/27/22 1432 05/27/22 1502  BP: (!) 170/74 (!) 123/56  Pulse: 83 77  Temp: 98.1 F (36.7 C)   TempSrc: Oral   SpO2: 100%   Weight: 156 lb 9.6 oz (71 kg)   Height: '4\' 11"'$  (1.499 m)     Physical Exam: Constitutional: well-appearing, in no acute distress HENT: normocephalic atraumatic Eyes: conjunctiva non-erythematous Neck: supple Cardiovascular: regular rate and rhythm, no m/r/g Pulmonary/Chest: normal work of breathing on room air, lungs clear to auscultation bilaterally MSK: normal bulk and tone Neurological: alert & oriented x 3 Skin: warm and dry Psych: normal mood  Assessment & Plan:   Hypertension associated with diabetes (HCC) Assessment: BP regimen of lisinopril 20 mg daily and chlorthalidone 25 mg daily. Repeat BP well controlled and at goal 123/56.   Plan: - continue lisinopril 20 mg and chlorthalidone 25 mg daily  Type 2 diabetes mellitus with renal manifestations (HCC) Assessment: A1c of 9.4% to 11.8%. Prescribed regimen jardiance 10 mg, victoza .'6mg'$ , metformin 500 mg daily, and glipizide 2.5 mg BID. However, she was unable to afford her jardiance and victoza. She is only taking metformin a few days a week as she notices GI side effects if she takes it daily.   Will reflil jardiance and change victoza to trulicity as on formulary. Discussed healthy diet and getting back into exercising regularly  Plan: - refill jardiance 10 mg daily and trulicity .75 mg weekly. Instructed to call if unable to fill due to affordability  - continue metformin 500 mg daily and glipizide 2.5 mg BID - A1c in 3 months - follow up with Butch Penny,  diabetes coordinator  Patient discussed with Dr. Delano Metz, D.O. [facility], PGY-3 Phone: [phone] Date 05/27/2022 Time 10:54 PM

## 2022-05-27 NOTE — Assessment & Plan Note (Addendum)
Assessment: A1c of 9.4% to 11.8%. Prescribed regimen jardiance 10 mg, victoza .'6mg'$ , metformin 500 mg daily, and glipizide 2.5 mg BID. However, she was unable to afford her jardiance and victoza. She is only taking metformin a few days a week as she notices GI side effects if she takes it daily.   Will reflil jardiance and change victoza to trulicity as on formulary. Discussed healthy diet and getting back into exercising regularly  Plan: - refill jardiance 10 mg daily and trulicity .75 mg weekly. Instructed to call if unable to fill due to affordability  - continue metformin 500 mg daily and glipizide 2.5 mg BID - A1c in 3 months - follow up with Butch Penny, diabetes coordinator

## 2022-05-27 NOTE — Patient Instructions (Addendum)
Thank you, Ms.Vianca A Kleinert for allowing [place] to provide your care today. Today we discussed .  Diabetes Your medications are Jardiance, Trulicity, Metformin and Glipizide. If you have problems taking these medications please call us. You will take the trulicity once a week. Please check your feet daily for wounds. I will refer you to meet with Butch Penny our diabetes coordinator.   I have ordered the following labs for you:  Lab Orders         Glucose, capillary         POC Hbg A1C     Referrals ordered today:   Referral Orders         Referral to Nutrition and Diabetes Services       I have ordered the following medication/changed the following medications:   Stop the following medications: Medications Discontinued During This Encounter  Medication Reason   liraglutide (VICTOZA) 18 MG/3ML SOPN Change in therapy   empagliflozin (JARDIANCE) 10 MG TABS tablet Reorder   Insulin Pen Needle 32G X 4 MM MISC Reorder     Start the following medications: Meds ordered this encounter  Medications   empagliflozin (JARDIANCE) 10 MG TABS tablet    Sig: Take 1 tablet (10 mg total) by mouth daily before breakfast.    Dispense:  90 tablet    Refill:  2    Please epic message if unaffordable and better option   Dulaglutide (TRULICITY) 4.40 NU/2.7OZ SOPN    Sig: Inject 0.75 mg into the skin once a week.    Dispense:  0.5 mL    Refill:  3    Please epic message if unaffordable and better alternative   Insulin Pen Needle 32G X 4 MM MISC    Sig: Use as directed with victoza    Dispense:  100 each    Refill:  3     Follow up: 3 months    Should you have any questions or concerns please call the internal medicine clinic at [phone].    Sanjuana Letters, D.O. [place]

## 2022-05-28 ENCOUNTER — Ambulatory Visit (INDEPENDENT_AMBULATORY_CARE_PROVIDER_SITE_OTHER): Payer: Medicare Other

## 2022-05-28 VITALS — BP 170/74 | HR 83 | Temp 98.1°F | Ht 59.0 in | Wt 156.6 lb

## 2022-05-28 DIAGNOSIS — Z0000 Encounter for general adult medical examination without abnormal findings: Secondary | ICD-10-CM

## 2022-05-28 NOTE — Progress Notes (Signed)
Subjective:   [name] is a 77 y.o. female who presents for Medicare Annual (Subsequent) preventive examination. I connected with  Tonita Cong on 06/06/22 by a  Face-To-Face  enabled telemedicine application and verified that I am speaking with the correct person using two identifiers.  Patient Location: Other:  Office/Clinic  Provider Location: Office/Clinic  I discussed the limitations of evaluation and management by telemedicine. The patient expressed understanding and agreed to proceed.  Review of Systems    Defer to PCP       Objective:    Today's Vitals   05/28/22 0956  BP: (!) 170/74  Pulse: 83  Temp: 98.1 F (36.7 C)  TempSrc: Oral  SpO2: 100%  Weight: 156 lb 9.6 oz (71 kg)  Height: '4\' 11"'$  (1.499 m)   Body mass index is 31.63 kg/m.     05/28/2022    9:57 AM 05/27/2022    2:30 PM 02/19/2022    2:11 PM 12/11/2021    1:17 PM 10/17/2021   10:13 AM 09/19/2021    1:51 PM 09/05/2021   10:06 AM  Advanced Directives  Does Patient Have a Medical Advance Directive? Yes No;Yes Yes Yes Yes Yes Yes  Type of Advance Directive Living will Living will Living will Living will [place];Living will Living will Living will  Does patient want to make changes to medical advance directive?   No - Patient declined        Current Medications (verified) Outpatient Encounter Medications as of 05/28/2022  Medication Sig   Accu-Chek Softclix Lancets lancets Check blood sugar up to three times daily.   atorvastatin (LIPITOR) 40 MG tablet TAKE 1 TABLET BY MOUTH EVERYDAY AT BEDTIME   Dulaglutide (TRULICITY) 2.67 TI/4.5YK SOPN Inject 0.75 mg into the skin once a week.   empagliflozin (JARDIANCE) 10 MG TABS tablet Take 1 tablet (10 mg total) by mouth daily before breakfast.   fluticasone (FLONASE) 50 MCG/ACT nasal spray Place 1 spray into both nostrils daily.   glipiZIDE (GLUCOTROL) 5 MG tablet Take 0.5 tablets (2.5 mg total) by mouth 2 (two) times daily.    glucose blood test strip Use as instructed   Insulin Pen Needle 32G X 4 MM MISC Use as directed with victoza   lisinopril (ZESTRIL) 20 MG tablet TAKE 1 TABLET BY MOUTH EVERY DAY   loratadine (CLARITIN) 10 MG tablet Take 1 tablet (10 mg total) by mouth daily.   metFORMIN (GLUCOPHAGE-XR) 500 MG 24 hr tablet Take 1 tablet (500 mg total) by mouth daily with breakfast.   Polyethyl Glycol-Propyl Glycol (SYSTANE) 0.4-0.3 % SOLN Place 1 drop into both eyes daily as needed (Dry eyes). (Patient not taking: Reported on 09/02/2021)   polyethylene glycol (MIRALAX) 17 g packet Take 17 g by mouth daily.   [DISCONTINUED] chlorthalidone (HYGROTON) 25 MG tablet TAKE 1 TABLET (25 MG TOTAL) BY MOUTH DAILY.   No facility-administered encounter medications on file as of 05/28/2022.    Allergies (verified) Hydrochlorothiazide   History: Past Medical History:  Diagnosis Date   Allergy    Anemia    Breast CA ([place])    (Rt) breast ca dx 4/12---  S/P RADICAL MASTECTOMY AND CHEMORADIATION   Diabetes mellitus ORAL MED   Fatty liver disease, nonalcoholic    History of hyperkalemia    Hx antineoplastic chemo  10/12 - 06/24/11   PACLITAX EL COMPLETED 06/24/11   Hyperlipidemia    Hypertension    Mild nonproliferative diabetic retinopathy of right eye (  [place]) 03/19/11   Dr. Joseph Art   Numbness and tingling    Hx: of in fingers and toes since chemotherapy   OA (osteoarthritis) of knee    RIGHT LEG   Obesity    Renal lesion    Past Surgical History:  Procedure Laterality Date   COLONOSCOPY W/ POLYPECTOMY     Hx; of   I & D KNEE WITH POLY EXCHANGE Right 09/03/2012   Procedure: IRRIGATION AND DEBRIDEMENT RIGHT KNEE WITH POLY EXCHANGE;  Surgeon: Kerin Salen, MD;  Location: [facility];  Service: Orthopedics;  Laterality: Right;   JOINT REPLACEMENT     knee arthroscopy  10-28-1999   RIGHT   Laparoscopy with laparoscopic right salpingo oophorectomy and lysis of pelvic and abdominal adhesions  March 2013   Dr. Nori Riis     LEFT TOTAL KNEE ARTHROPLASTY (Left Knee)  07/30/2020   MASTECTOMY MODIFIED RADICAL  12-04-2010   W/ LEFT PAC PLACEMENT (RIGHT BREAST W/ AXILLARY CONTENTS/ NODE BX'S   PORT-A-CATH REMOVAL  09/24/2011   Procedure: REMOVAL PORT-A-CATH;  Surgeon: Joyice Faster. Cornett, MD;  Location: [facility];  Service: General;  Laterality: N/A;   PORTACATH PLACEMENT     REPLACED PAC DUE TO MALFUNCTION (LEFT)   TOTAL KNEE ARTHROPLASTY Right 08/16/2012   Procedure: TOTAL KNEE ARTHROPLASTY;  Surgeon: Kerin Salen, MD;  Location: [facility];  Service: Orthopedics;  Laterality: Right;   TOTAL KNEE ARTHROPLASTY Left 07/30/2020   Procedure: LEFT TOTAL KNEE ARTHROPLASTY;  Surgeon: Leandrew Koyanagi, MD;  Location: [facility];  Service: Orthopedics;  Laterality: Left;   TRANSTHORACIC ECHOCARDIOGRAM  10-29-2010   NORMAL LVF/ EF 55-60%/ MILD MV REGURG   TUBAL LIGATION  YRS AGO   VAGINAL HYSTERECTOMY  AGE 74   W/ LSO   Family History  Problem Relation Age of Onset   Stroke Father    Cancer Sister        breast   Cancer Sister        COLON CANCER   Stroke Sister    Heart disease Brother    Social History   Socioeconomic History   Marital status: Married    Spouse name: Not on file   Number of children: 4   Years of education: 16   Highest education level: Not on file  Occupational History   Not on file  Tobacco Use   Smoking status: Never   Smokeless tobacco: Never  Vaping Use   Vaping Use: Never used  Substance and Sexual Activity   Alcohol use: No    Alcohol/week: 0.0 standard drinks of alcohol   Drug use: No    Types: Flunitrazepam   Sexual activity: Not on file    Comment: MENARCHE AGE 44, G4, P4, PARITY AGE 51, HRT 20+ YEARS    Other Topics Concern   Not on file  Social History Narrative   Current Social History 01/14/2021        Patient lives with spouse in a home is 2 stories. There are 2 steps up to the entrance the patient uses with railings.      Patient's method of transportation is personal car.       The highest level of education was high school diploma.      The patient currently retired.      Identified important Relationships are "my family"      Pets : 1       Interests / Fun: [employer]"       Current  Stressors: "bills"      Religious / Personal Beliefs: [employer]       Social Determinants of Health   Financial Resource Strain: Low Risk  (05/28/2022)   Overall Financial Resource Strain (CARDIA)    Difficulty of Paying Living Expenses: Not very hard  Food Insecurity: No Food Insecurity (05/28/2022)   Hunger Vital Sign    Worried About Running Out of Food in the Last Year: Never true    Ran Out of Food in the Last Year: Never true  Transportation Needs: No Transportation Needs (05/28/2022)   PRAPARE - Hydrologist (Medical): No    Lack of Transportation (Non-Medical): No  Physical Activity: Insufficiently Active (05/28/2022)   Exercise Vital Sign    Days of Exercise per Week: 1 day    Minutes of Exercise per Session: 30 min  Stress: No Stress Concern Present (05/28/2022)   [place]    Feeling of Stress : Not at all  Social Connections: [place] (05/28/2022)   Social Connection and Isolation Panel [NHANES]    Frequency of Communication with Friends and Family: Twice a week    Frequency of Social Gatherings with Friends and Family: Twice a week    Attends Religious Services: More than 4 times per year    Active Member of [employer] or Organizations: Yes    Attends Music therapist: More than 4 times per year    Marital Status: Married    Tobacco Counseling Counseling given: Not Answered   Clinical Intake:  Pre-visit preparation completed: Yes  Pain : No/denies pain   Diabetes:Nutrition Risk Assessment:  Has the patient had any N/V/D within the last 2 months?  No  Does the patient have any non-healing wounds?  No  Has the patient had any  unintentional weight loss or weight gain?  No   Diabetes:  Is the patient diabetic?  Yes  If diabetic, was a CBG obtained today?  Yes  Did the patient bring in their glucometer from home?  No  How often do you monitor your CBG's? Every 3 months.   Financial Strains and Diabetes Management:  Are you having any financial strains with the device, your supplies or your medication? No .  Does the patient want to be seen by Chronic Care Management for management of their diabetes?  No  Would the patient like to be referred to a Nutritionist or for Diabetic Management?  No   Diabetic Exams:  Diabetic Eye Exam: Completed 08/27/2021 Diabetic Foot Exam: Completed 09/05/2021      How often do you need to have someone help you when you read instructions, pamphlets, or other written materials from your doctor or pharmacy?: 1 - Never What is the last grade level you completed in school?: college 4 years  Diabetic?Nutrition Risk Assessment:  Has the patient had any N/V/D within the last 2 months?  No  Does the patient have any non-healing wounds?  No  Has the patient had any unintentional weight loss or weight gain?  No   Diabetes:  Is the patient diabetic?  Yes  If diabetic, was a CBG obtained today?  Yes  Did the patient bring in their glucometer from home?  No  How often do you monitor your CBG's? Every 3 months.   Financial Strains and Diabetes Management:  Are you having any financial strains with the device, your supplies or your medication? No .  Does  the patient want to be seen by Chronic Care Management for management of their diabetes?  No  Would the patient like to be referred to a Nutritionist or for Diabetic Management?  No   Diabetic Exams:  Diabetic Eye Exam: Completed 08/27/2021 Diabetic Foot Exam: Completed 09/05/2021    Interpreter Needed?: No  Information entered by :: Kendrick Haapala,cma 05/28/22 9:57am   Activities of Daily Living    05/28/2022    9:57 AM  05/27/2022    2:30 PM  In your present state of health, do you have any difficulty performing the following activities:  Hearing? 0 0  Vision? 0 0  Difficulty concentrating or making decisions? 0 0  Walking or climbing stairs? 0 0  Dressing or bathing? 0 0  Doing errands, shopping? 0 0    Patient Care Team: Riesa Pope, MD as PCP - General  Indicate any recent Medical Services you may have received from other than [facility] providers in the past year (date may be approximate).     Assessment:   This is a routine wellness examination for [facility]  Hearing/Vision screen No results found.  Dietary issues and exercise activities discussed:     Goals Addressed   None   Depression Screen    05/28/2022    9:57 AM 05/27/2022    2:30 PM 02/19/2022    2:12 PM 12/11/2021    1:17 PM 09/05/2021   10:07 AM 08/08/2021    9:46 AM 02/21/2021   12:18 PM  PHQ 2/9 Scores  PHQ - 2 Score 0 0 0 0 0 0 0  PHQ- 9 Score   0   0 1    Fall Risk    05/28/2022    9:57 AM 05/27/2022    2:30 PM 02/19/2022    2:11 PM 12/11/2021    1:17 PM 09/19/2021    1:50 PM  Fall Risk   Falls in the past year? 0 0 0 0 0  Number falls in past yr: 0 0  0 0  Injury with Fall? 0 0  0 0  Risk for fall due to : No Fall Risks No Fall Risks No Fall Risks No Fall Risks No Fall Risks  Follow up Falls evaluation completed;Falls prevention discussed Falls evaluation completed;Falls prevention discussed Falls evaluation completed Falls evaluation completed;Falls prevention discussed Falls evaluation completed    FALL RISK PREVENTION PERTAINING TO THE HOME:  Any stairs in or around the home? Yes  If so, are there any without handrails? No  Home free of loose throw rugs in walkways, pet beds, electrical cords, etc? Yes  Adequate lighting in your home to reduce risk of falls? Yes   ASSISTIVE DEVICES UTILIZED TO PREVENT FALLS:  Life alert? No  Use of a cane, walker or w/c? No  Grab bars in the bathroom? Yes  Shower  chair or bench in shower? No  Elevated toilet seat or a handicapped toilet? No   TIMED UP AND GO:  Was the test performed? Yes .  Length of time to ambulate 10 feet: 1 min.   Gait steady and fast without use of assistive device  Cognitive Function:        06/06/2022   10:14 AM 05/28/2022    9:58 AM 01/14/2021   10:57 AM  6CIT Screen  What Year? 0 points 0 points 0 points  What month? 0 points 0 points 0 points  What time? 0 points 0 points 0 points  Count back from 20 0  points 0 points 0 points  Months in reverse 0 points 0 points 0 points  Repeat phrase 0 points 0 points 2 points  Total Score 0 points 0 points 2 points    Immunizations Immunization History  Administered Date(s) Administered   Fluad Quad(high Dose 65+) 01/24/2021, 02/19/2022   Influenza Whole 02/22/1998, 03/25/2007, 01/23/2010, 01/20/2011   Influenza,inj,Quad PF,6+ Mos 02/16/2013, 04/09/2016, 03/04/2017, 01/14/2018, 02/10/2019, 06/20/2020   Moderna Sars-Covid-2 Vaccination 06/22/2019, 07/20/2019   Pneumococcal Conjugate-13 07/10/2016   Pneumococcal Polysaccharide-23 01/23/2010, 11/17/2012   Td 12/29/2008   Tdap 12/12/2019    TDAP status: Up to date  Flu Vaccine status: Up to date  Pneumococcal vaccine status: Up to date  Covid-19 vaccine status: Completed vaccines  Qualifies for Shingles Vaccine? No   Zostavax completed No   Shingrix Completed?: No.    Education has been provided regarding the importance of this vaccine. Patient has been advised to call insurance company to determine out of pocket expense if they have not yet received this vaccine. Advised may also receive vaccine at local pharmacy or Health Dept. Verbalized acceptance and understanding.  Screening Tests Health Maintenance  Topic Date Due   Zoster Vaccines- Shingrix (1 of 2) Never done   COVID-19 Vaccine (3 - Moderna risk series) 08/17/2019   HEMOGLOBIN A1C  08/26/2022   OPHTHALMOLOGY EXAM  08/28/2022   Diabetic kidney  evaluation - Urine ACR  09/06/2022   FOOT EXAM  09/06/2022   Diabetic kidney evaluation - eGFR measurement  02/27/2023   MAMMOGRAM  04/02/2023   Medicare Annual Wellness (AWV)  05/29/2023   COLONOSCOPY (Pts 45-19yr Insurance coverage will need to be confirmed)  01/31/2025   DTaP/Tdap/Td (3 - Td or Tdap) 12/11/2029   Pneumonia Vaccine 75 Years old  Completed   INFLUENZA VACCINE  Completed   DEXA SCAN  Completed   Hepatitis C Screening  Completed   HPV VACCINES  Aged Out   LIPID PANEL  Discontinued    Health Maintenance  Health Maintenance Due  Topic Date Due   Zoster Vaccines- Shingrix (1 of 2) Never done   COVID-19 Vaccine (3 - Moderna risk series) 08/17/2019    Colorectal cancer screening: Type of screening: Colonoscopy. Completed 02/01/2020. Repeat every 5 years  Mammogram status: Ordered Scheduled for 07/02/2022. Pt provided with contact info and advised to call to schedule appt.   Bone Density status: Completed 12/25/2014. Results reflect: Bone density results: NORMAL. Repeat every 0 years.  Lung Cancer Screening: (Low Dose CT Chest recommended if Age 75-80years, 30 pack-year currently smoking OR have quit w/in 15years.) does not qualify.   Lung Cancer Screening Referral: N/A  Additional Screening:  Hepatitis C Screening: does not qualify; Completed 04/11/2015  Vision Screening: Recommended annual ophthalmology exams for early detection of glaucoma and other disorders of the eye. Is the patient up to date with their annual eye exam?  Yes  Who is the provider or what is the name of the office in which the patient attends annual eye exams? N/A If pt is not established with a provider, would they like to be referred to a provider to establish care? No .   Dental Screening: Recommended annual dental exams for proper oral hygiene  Community Resource Referral / Chronic Care Management: CRR required this visit?  No   CCM required this visit?  No      Plan:     I  have personally reviewed and noted the following in the patient's chart:   Medical and  social history Use of alcohol, tobacco or illicit drugs  Current medications and supplements including opioid prescriptions. Patient is not currently taking opioid prescriptions. Functional ability and status Nutritional status Physical activity Advanced directives List of other physicians Hospitalizations, surgeries, and ER visits in previous 12 months Vitals Screenings to include cognitive, depression, and falls Referrals and appointments  In addition, I have reviewed and discussed with patient certain preventive protocols, quality metrics, and best practice recommendations. A written personalized care plan for preventive services as well as general preventive health recommendations were provided to patient.     Kerin Perna, [facility]   06/06/2022   Nurse Notes: Face-To-Face visit  [name] , Thank you for taking time to come for your Medicare Wellness Visit. I appreciate your ongoing commitment to your health goals. Please review the following plan we discussed and let me know if I can assist you in the future.   These are the goals we discussed:  Goals   None     This is a list of the screening recommended for you and due dates:  Health Maintenance  Topic Date Due   Zoster (Shingles) Vaccine (1 of 2) Never done   COVID-19 Vaccine (3 - Moderna risk series) 08/17/2019   Hemoglobin A1C  08/26/2022   Eye exam for diabetics  08/28/2022   Yearly kidney health urinalysis for diabetes  09/06/2022   Complete foot exam   09/06/2022   Yearly kidney function blood test for diabetes  02/27/2023   Mammogram  04/02/2023   Medicare Annual Wellness Visit  05/29/2023   Colon Cancer Screening  01/31/2025   DTaP/Tdap/Td vaccine (3 - Td or Tdap) 12/11/2029   Pneumonia Vaccine  Completed   Flu Shot  Completed   DEXA scan (bone density measurement)  Completed   Hepatitis C Screening: USPSTF Recommendation  to screen - Ages 45-79 yo.  Completed   HPV Vaccine  Aged Out   Lipid (cholesterol) test  Discontinued

## 2022-05-30 ENCOUNTER — Other Ambulatory Visit: Payer: Self-pay | Admitting: Student

## 2022-05-30 DIAGNOSIS — E1159 Type 2 diabetes mellitus with other circulatory complications: Secondary | ICD-10-CM

## 2022-06-02 NOTE — Progress Notes (Signed)
Internal Medicine Clinic Attending  Case discussed with the resident at the time of the visit.  We reviewed the resident's history and exam and pertinent patient test results.  I agree with the assessment, diagnosis, and plan of care documented in the resident's note.  

## 2022-06-05 ENCOUNTER — Other Ambulatory Visit (HOSPITAL_COMMUNITY): Payer: Self-pay

## 2022-06-06 DIAGNOSIS — Z0000 Encounter for general adult medical examination without abnormal findings: Secondary | ICD-10-CM | POA: Diagnosis not present

## 2022-06-23 ENCOUNTER — Other Ambulatory Visit (HOSPITAL_COMMUNITY): Payer: Self-pay

## 2022-06-23 ENCOUNTER — Ambulatory Visit (INDEPENDENT_AMBULATORY_CARE_PROVIDER_SITE_OTHER): Payer: Medicare Other | Admitting: Dietician

## 2022-06-23 DIAGNOSIS — Z794 Long term (current) use of insulin: Secondary | ICD-10-CM | POA: Diagnosis not present

## 2022-06-23 DIAGNOSIS — Z7984 Long term (current) use of oral hypoglycemic drugs: Secondary | ICD-10-CM | POA: Diagnosis not present

## 2022-06-23 DIAGNOSIS — E1121 Type 2 diabetes mellitus with diabetic nephropathy: Secondary | ICD-10-CM | POA: Diagnosis not present

## 2022-06-23 NOTE — Progress Notes (Signed)
Medical Nutrition Therapy :  Appt start time: [id number] end time:  1410. Total time: 43 Visit # 1 Assessment:  Primary concerns today: glycemic control.  She did not bring her meter. She states she gets depressed and frustrated when it is always high no matter what she eats. She has not been taking her medications because she thought they were too expensive. Her description of her dietary intake  reveals several sources of saturated fats and large carbohydrate meals, however she seems reluctant to make changes to this at this time.  Preferred Learning Style: No preference indicated  Learning Readiness: Contemplating in some areas such as food choices and self monitoring and Ready for assistance with medications and exercising  ANTHROPOMETRICS:she deferred checking her weight today Estimated body mass index is 31.63 kg/m as calculated from the following:   Height as of 05/28/22: 4' 11"$  (1.499 m).   Weight as of 05/28/22: 156 lb 9.6 oz (71 kg).  WEIGHT HISTORY:  Wt Readings from Last 10 Encounters:  05/28/22 156 lb 9.6 oz (71 kg)  05/27/22 156 lb 9.6 oz (71 kg)  02/19/22 153 lb 6.4 oz (69.6 kg)  12/11/21 152 lb 4.8 oz (69.1 kg)  11/14/21 154 lb 6.4 oz (70 kg)  10/17/21 153 lb 1.6 oz (69.4 kg)  09/19/21 153 lb 8 oz (69.6 kg)  09/05/21 152 lb 3.2 oz (69 kg)  08/08/21 151 lb 8 oz (68.7 kg)  02/21/21 150 lb 8 oz (68.3 kg)    SLEEP:need to assess at future visit MEDICATIONS: Patient states that Vania Rea is too expensive and she has not heard about the Trulicity. She states she cannot take even the 500 mg metformin ER because it makes her sick on the stomach. Therefore she only takes glipizide and she missed that this morning because she was in a hurry. (Consider the er formulation) We called her pharmacy and both the 90 day supply of jardiance and her 30 days supply of Trulicity were-$ [id number] al together. She said that is affordable and agreed to pick them up and start them. She was educated about  how to use the Trulicity pen and to only take once a week.  Victoza/Trulcity too expensive BLOOD SUGAR: offered to check her blood sugar today but she deferred this until her next visit Lab Results  Component Value Date   HGBA1C 11.8 (A) 05/27/2022   HGBA1C 9.4 (A) 02/19/2022   HGBA1C 8.2 (A) 11/14/2021   HGBA1C 11.4 (A) 08/08/2021   HGBA1C 7.9 (A) 01/24/2021     DIETARY INTAKE: Usual eating pattern includes 2 meals and 0-2 snacks per day. Everyday foods include hamburger, bologna, sausage, cheese, biscuits, cornbread.  Avoided foods include milk  Dining Out (times/week): 5 24-hr recall:  B ( 10-11 AM): biscuits x3 or when out egg, cheese and sausage biscuit or biscuit and gravy with orange juice  Snk ( PM): grapes, grapefruit or banana D ( 4-7PM): cabbage and potatoes, biscuit, meatloaf or vegetable soup with meatballs, water Beverages: water. Orange juice, unsweet cranberry juice  Usual physical activity: renewed her gym membership and intends to start chair yoga next week. Has not been doing any formal exercise for the past few years   Progress Towards Goal(s):  In progress.   Nutritional Diagnosis:  NB-1.1 Food and nutrition-related knowledge deficit As related to diabetes self management .  As evidenced by her report and high A1C.    Intervention:  Nutrition education about options for loweing blood sugar, how carbohydrates and saturated fat  affect blood sugars. Action Goal: see patient instructions  Outcome goal: improved diabetes knowledge Coordination of care: none  Teaching Method Utilized: Visual, Auditory,Hands on Handouts given during visit include: After visit summary Barriers to learning/adherence to lifestyle change: competing values Demonstrated degree of understanding via:  Teach Back   Monitoring/Evaluation:  Dietary intake, exercise, meter, and body weight 3 weeks  [name], RD 06/23/2022 2:32 PM. .

## 2022-06-23 NOTE — Patient Instructions (Addendum)
Your plan to lower your blood sugars:   1- Pick up your medicines and start them- Jardiance and Trulicuty along with your glipizide  2- limit biscuits and cornbread and bread to 2 at one time  3- try to bring your meter to our next visit 4- Goal is to start going to [employer] at least 1-2 times a week- chair yoga  Butch Penny [phone]

## 2022-07-02 ENCOUNTER — Ambulatory Visit
Admission: RE | Admit: 2022-07-02 | Discharge: 2022-07-02 | Disposition: A | Discharge status: Home / Self Care | Payer: Medicare Other | Source: Ambulatory Visit | Attending: Student in an Organized Health Care Education/Training Program | Admitting: Student in an Organized Health Care Education/Training Program

## 2022-07-02 DIAGNOSIS — Z1231 Encounter for screening mammogram for malignant neoplasm of breast: Secondary | ICD-10-CM | POA: Diagnosis not present

## 2022-07-02 HISTORY — DX: Personal history of antineoplastic chemotherapy: Z92.21

## 2022-07-02 HISTORY — DX: Personal history of irradiation: Z92.3

## 2022-07-15 ENCOUNTER — Encounter: Payer: Self-pay | Admitting: Dietician

## 2022-07-15 ENCOUNTER — Telehealth: Payer: Self-pay | Admitting: Dietician

## 2022-07-15 NOTE — Telephone Encounter (Signed)
Patient rescheduled.

## 2022-07-17 ENCOUNTER — Encounter: Payer: Medicare Other | Admitting: Dietician

## 2022-07-21 ENCOUNTER — Telehealth: Payer: Self-pay | Admitting: Dietician

## 2022-07-21 ENCOUNTER — Ambulatory Visit (INDEPENDENT_AMBULATORY_CARE_PROVIDER_SITE_OTHER): Payer: Medicare Other | Admitting: Dietician

## 2022-07-21 DIAGNOSIS — E1121 Type 2 diabetes mellitus with diabetic nephropathy: Secondary | ICD-10-CM

## 2022-07-21 DIAGNOSIS — E1122 Type 2 diabetes mellitus with diabetic chronic kidney disease: Secondary | ICD-10-CM

## 2022-07-21 NOTE — Telephone Encounter (Signed)
Patient requests referral to PREP classes

## 2022-07-21 NOTE — Progress Notes (Signed)
  Medical Nutrition Therapy :  Appt start time: [id number] end time:  1350. Total time: 35 minutes Visit # 2 Assessment:  Primary concerns today: glycemic control.  She did  bring her meter. She is now taking her medications and notes a change in her appetite which she likes because she would like to decrease her weight.   Learning Readiness: Contemplating in some areas such as diet and Ready for assistance with medications and exercising ANTHROPOMETRICS:she deferred checking her weight again today SLEEP:not assessed today- will call to follow up MEDICATIONS: Patient states that she has been taking Jardiance and glipizide daily and Trulicity weekly without problems since her last visit.Marland Kitchen  BLOOD SUGAR: 143 toady about 6 hours after breakfast, has not been checking because she got new batteries and had put them in upside down so meter was not working until today when we corrected that.  A1c to be checked at next visit with doctor in April  DIETARY INTAKE: Usual eating pattern includes 2 meals and 0-2 snacks per day. Everyday foods include hamburger, bologna, sausage, american and sharp cheese, biscuits, cornbread, crackers.  Avoided foods include milk  B ( 10-11 AM): smaller portions of sausage biscuit or biscuit and gravy with orange juice  Snk ( PM): grapes, grapefruit or banana D ( 4-7PM): chicken and dumplings, sometimes bologna and cheese sandwich Beverages: water. Orange juice, unsweet cranberry juice  Usual physical activity: has not started and would like some help getting started going to [facility]   Progress Towards Goal(s):  Some progress.   Nutritional Diagnosis:  NB-1.1 Food and nutrition-related knowledge deficit As related to diabetes self management .  As evidenced by her report and high A1C.    Intervention:  Nutrition education about effects of her medciens, food choices and physical activity on her blood sugar. Action Goal: see patient instructions  Outcome goal: improved diabetes  knowledge Coordination of care: request PREP exercise classes, testing supplies  Teaching Method Utilized: Visual, Auditory,Hands on Handouts given during visit include: After visit summary Barriers to learning/adherence to lifestyle change: competing values Demonstrated degree of understanding via:  Teach Back   Monitoring/Evaluation:  Dietary intake, exercise, meter, and body weight 6 months per patient  Debera Lat, RD 07/21/2022 4:18 PM. .

## 2022-07-21 NOTE — Telephone Encounter (Signed)
Needs new prescriptions for strips and lancets. She also request PREP referral

## 2022-07-21 NOTE — Patient Instructions (Addendum)
Your plan to lower your blood sugars:    1- good job- picking up your medicines and starting them.   2- try to eat beans for protein once a week.   4-- Good Job- bringing your meter; check once daily like you were doing before breakfast and sometimes in the evening.  5- Goal is to start moving more- you should here from the PREP program soon.  6- Try to eat more unsaturated fats like peanut butter, chicken salad, make a sandwich with leftover chicken from your chicken and dumplings or have a small dish with a vegetable.      Butch Penny [phone]

## 2022-07-22 ENCOUNTER — Other Ambulatory Visit (HOSPITAL_COMMUNITY): Payer: Self-pay

## 2022-07-22 MED ORDER — ACCU-CHEK SOFTCLIX LANCETS MISC: 3 refills | Status: AC

## 2022-07-22 MED ORDER — ACCU-CHEK GUIDE VI STRP: ORAL_STRIP | 3 refills | Status: DC

## 2022-07-24 IMAGING — MG MM DIGITAL DIAGNOSTIC UNILAT*L* W/ TOMO W/ CAD
6 of 9 series · 6 of 25 positions shown · non-contrast
Comparison: Previous exam(s).

CLINICAL DATA: Screening recall for a possible left breast
asymmetry as well as questionable calcifications. Patient has a
history of a right mastectomy for breast carcinoma 7727.

EXAM:
DIGITAL DIAGNOSTIC UNILATERAL LEFT MAMMOGRAM WITH TOMOSYNTHESIS AND
CAD
TECHNIQUE: Left digital diagnostic mammography and breast tomosynthesis was
performed. The images were evaluated with computer-aided detection.

[L CC]
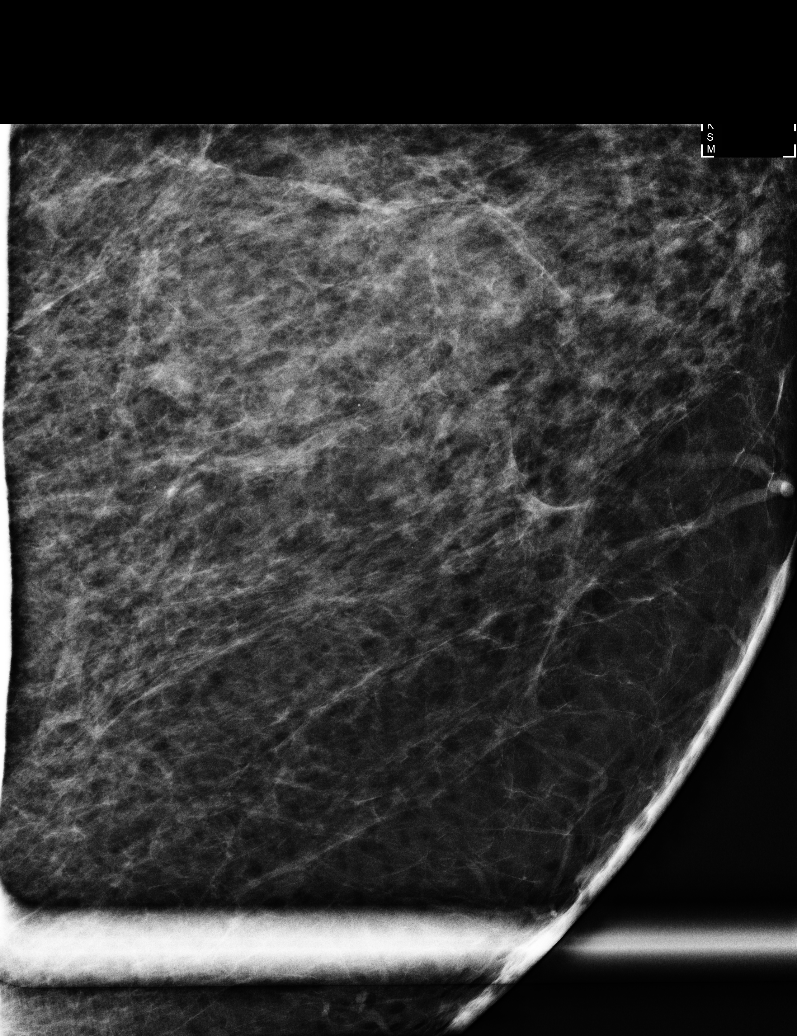

[L CC synth-2D]
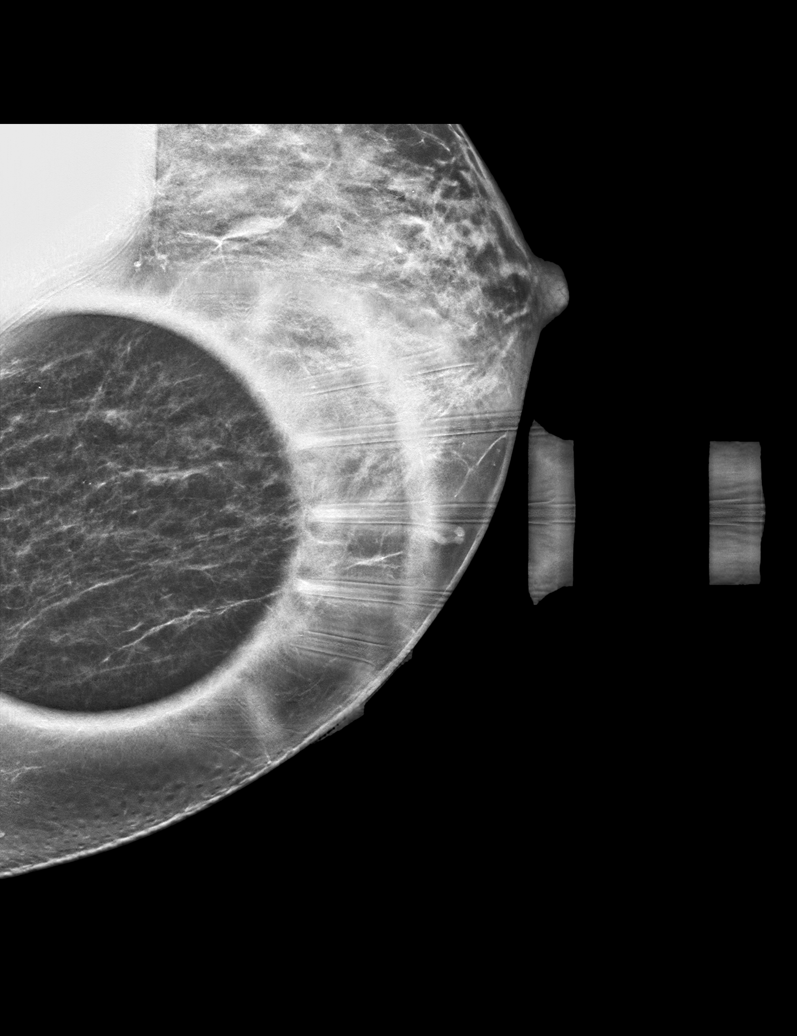

[L MLO synth-2D (1 of 2)]
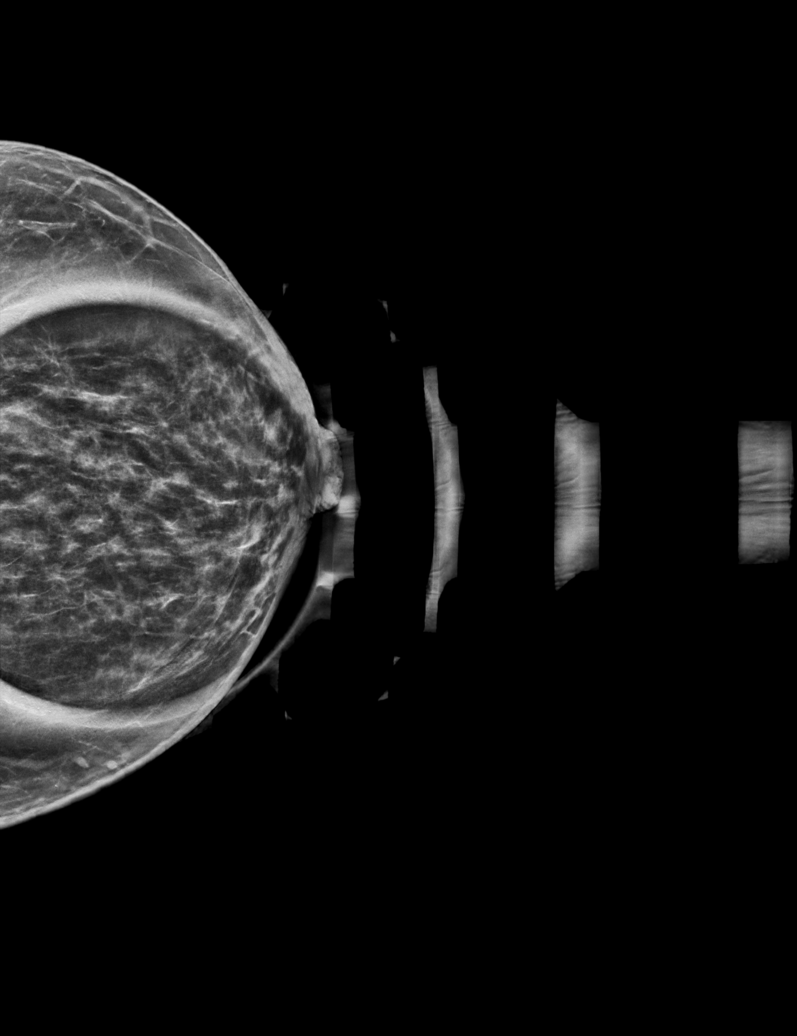

[L MLO synth-2D (2 of 2)]
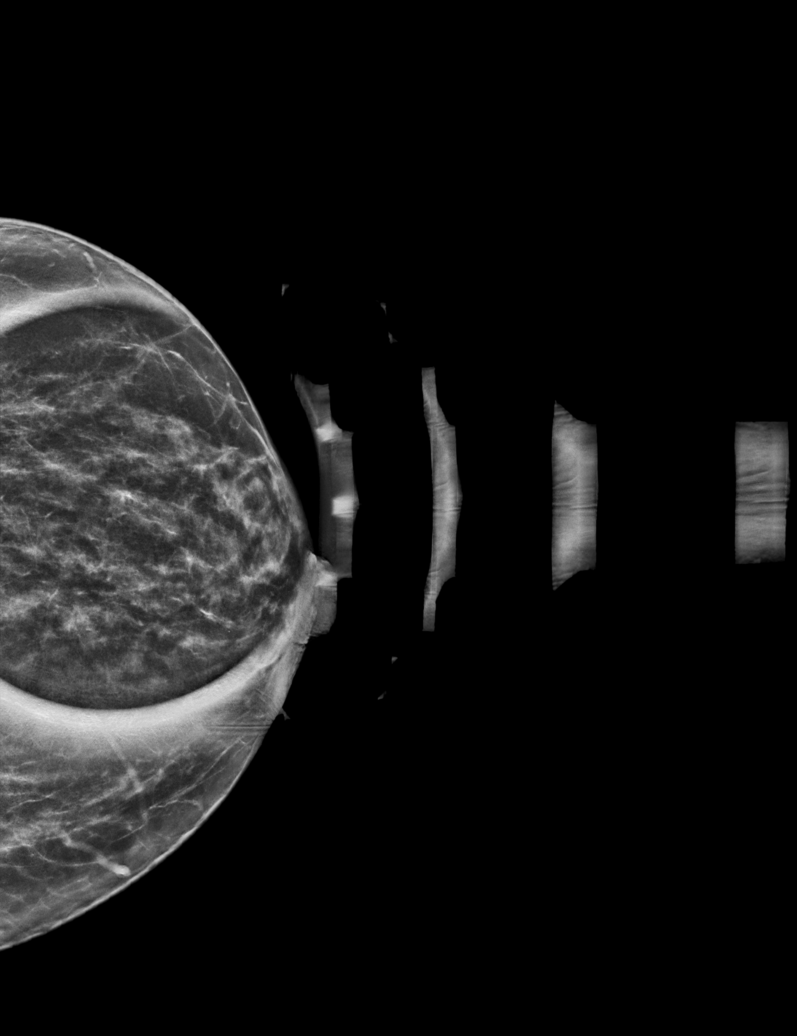

[L ML synth-2D]
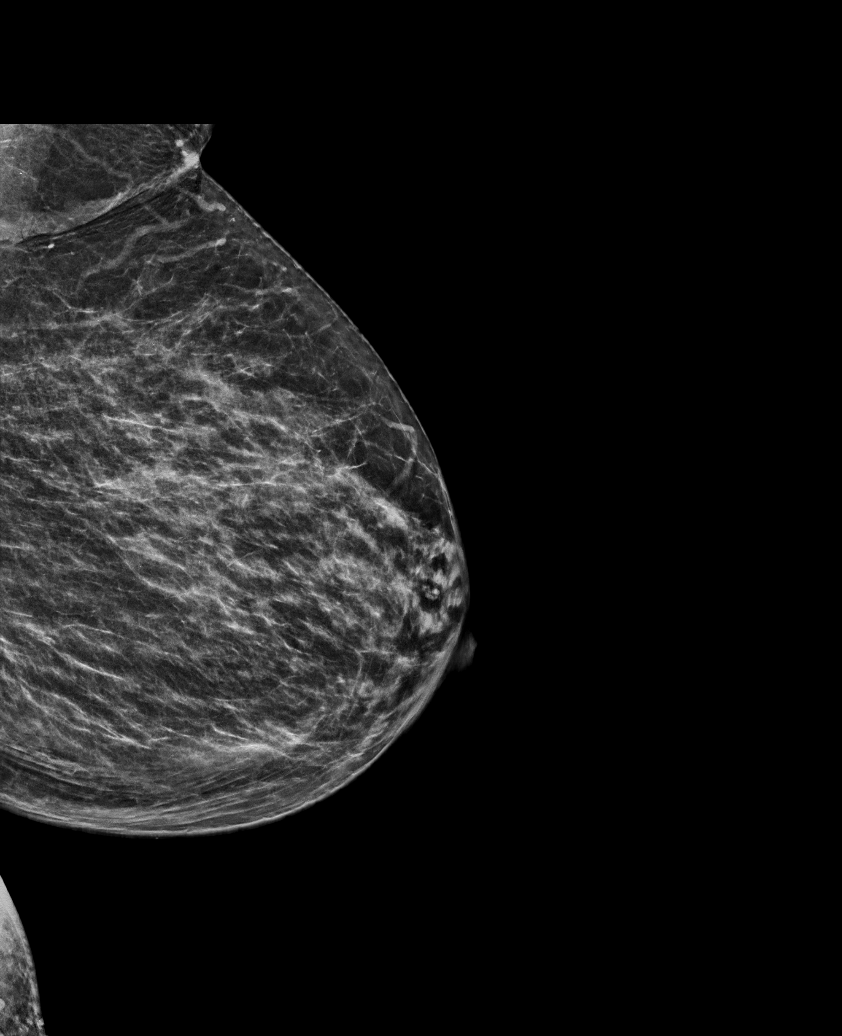

[L CC tomo · tomo slice 25/48.0]
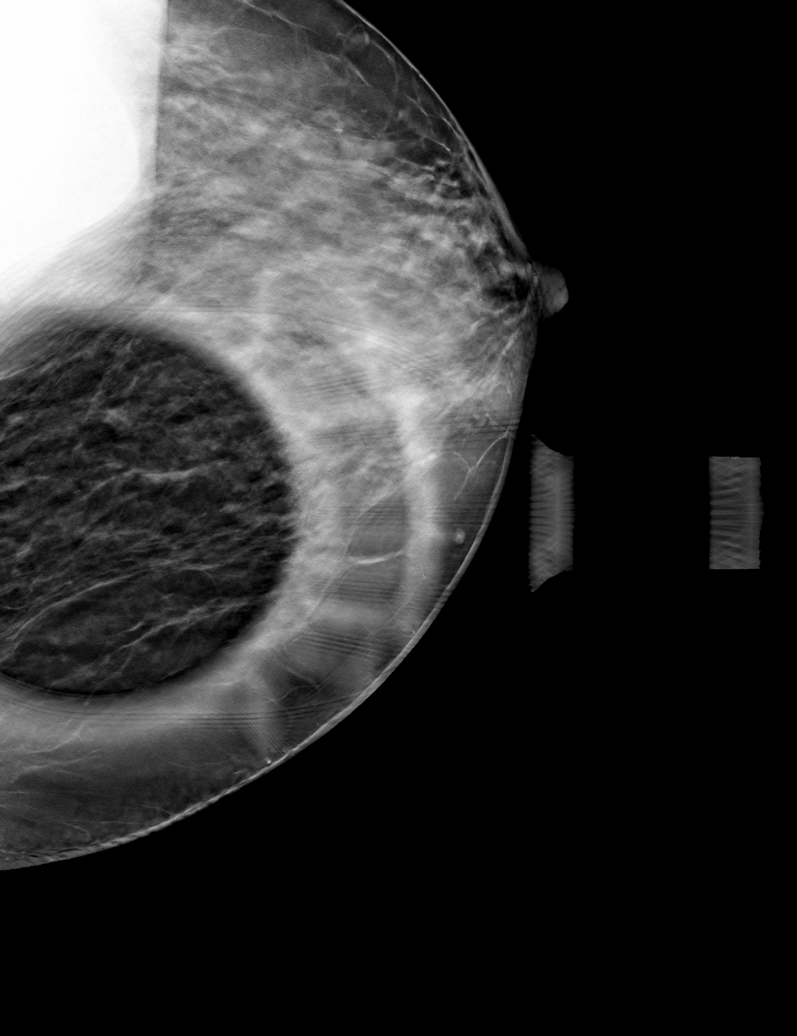

[6 of 25 positions shown; findings below may reference images not displayed]

ACR Breast Density Category c: The breast tissue is heterogeneously
dense, which may obscure small masses.
FINDINGS: On the magnification cc image of the left breast, the questionable
calcifications in the posterior slightly medial left breast are less
apparent. There are only a few tiny punctate calcifications that are
loosely approximated, with no linearity or branching and no
associated mass or distortion.

The possible asymmetry noted in the retroareolar left breast on the
MLO view disperses on spot compression imaging consistent with
superimposed normal tissue. There is no underlying mass or
significant residual asymmetry. There is no architectural
distortion.
IMPRESSION: 1. No evidence of breast malignancy.
2. Subtle benign calcifications in the left breast.

RECOMMENDATION:
Screening mammogram in one year.(Code:[id number])

I have discussed the findings and recommendations with the patient.
If applicable, a reminder letter will be sent to the patient
regarding the next appointment.

BI-RADS CATEGORY  2: Benign.

## 2022-07-29 ENCOUNTER — Encounter (HOSPITAL_COMMUNITY): Payer: Self-pay

## 2022-07-29 ENCOUNTER — Ambulatory Visit (HOSPITAL_COMMUNITY)
Admission: EM | Admit: 2022-07-29 | Discharge: 2022-07-29 | Disposition: A | Discharge status: Home / Self Care | Payer: Medicare Other | Attending: Internal Medicine | Admitting: Internal Medicine

## 2022-07-29 DIAGNOSIS — H6121 Impacted cerumen, right ear: Secondary | ICD-10-CM | POA: Diagnosis not present

## 2022-07-29 DIAGNOSIS — H6691 Otitis media, unspecified, right ear: Secondary | ICD-10-CM

## 2022-07-29 MED ORDER — AMOXICILLIN 500 MG PO CAPS: 500.0000 mg | ORAL_CAPSULE | Freq: Two times a day (BID) | ORAL | 0 refills | Status: AC

## 2022-07-29 NOTE — ED Triage Notes (Signed)
Pt states right ear pain and ringing in her right ear since yesterday.

## 2022-07-29 NOTE — Discharge Instructions (Signed)
After cleaning out your ears, it appears your right ear is infected.  Take amoxicillin twice a day for 7 days with food. You may take Tylenol as needed for pain.  If you develop any new or worsening symptoms or do not improve in the next 2 to 3 days, please return.  If your symptoms are severe, please go to the emergency room.  Follow-up with your primary care provider for further evaluation and management of your symptoms as well as ongoing wellness visits.  I hope you feel better!

## 2022-07-29 NOTE — ED Provider Notes (Signed)
[place]    CSN: [id number] Arrival date & time: 07/29/22  1055      History   Chief Complaint Chief Complaint  Patient presents with   Otalgia    HPI AAKRITI Ryan is a 77 y.o. female.   Patient presents to urgent care for evaluation of right-sided ear pain and pressure that started yesterday.  She denies recent trauma or injury to the ears and recent falls.  No dizziness, use of Q-tips to clean out earwax, recent ear infections, recent antibiotic/steroid use, nasal congestion, sore throat, fever/chills, vision changes, headache, or neck pain.  No cough, shortness of breath, or heart palpitations reported. Denies decreased hearing and tinnitus to bilateral ear canals.She has not used any over the counter medications for symptoms.    Otalgia   Past Medical History:  Diagnosis Date   Allergy    Anemia    Breast CA ([place])    (Rt) breast ca dx 4/12---  S/P RADICAL MASTECTOMY AND CHEMORADIATION   Diabetes mellitus ORAL MED   Fatty liver disease, nonalcoholic    History of hyperkalemia    Hx antineoplastic chemo  10/12 - 06/24/11   PACLITAX EL COMPLETED 06/24/11   Hyperlipidemia    Hypertension    Mild nonproliferative diabetic retinopathy of right eye ([place]) 03/19/2011   Dr. Benjaman Pott Poudyal   Numbness and tingling    Hx: of in fingers and toes since chemotherapy   OA (osteoarthritis) of knee    RIGHT LEG   Obesity    Personal history of chemotherapy    Personal history of radiation therapy    Renal lesion     Patient Active Problem List   Diagnosis Date Noted   Nausea 09/24/2020   Status post total left knee replacement 07/30/2020   Fatigue 02/10/2019   Primary osteoarthritis of left knee 01/25/2019   Cervical neuropathy 08/20/2018   Bilateral carpal tunnel syndrome 06/29/2018   Depressed mood 01/21/2018   Need for immunization against influenza 01/17/2018   Osteopenia 12/13/2013   Vaginal dryness 12/13/2013   Peripheral neuropathy 11/02/2013    Health care maintenance 11/17/2012   Arthritis of knee, right 08/16/2012   Breast cancer, right breast ([place]) 09/25/2010   COLONIC POLYPS, HX OF 07/19/2007   OBESITY NOS 07/09/2006   Type 2 diabetes mellitus with renal manifestations ([place]) 03/10/2006   Hyperlipidemia 03/10/2006   Hypertension associated with diabetes ([place]) 03/10/2006   Fatty liver 03/10/2006    Past Surgical History:  Procedure Laterality Date   COLONOSCOPY W/ POLYPECTOMY     Hx; of   I & D KNEE WITH POLY EXCHANGE Right 09/03/2012   Procedure: IRRIGATION AND DEBRIDEMENT RIGHT KNEE WITH POLY EXCHANGE;  Surgeon: Kerin Salen, MD;  Location: [place];  Service: Orthopedics;  Laterality: Right;   JOINT REPLACEMENT     knee arthroscopy  10/28/1999   RIGHT   Laparoscopy with laparoscopic right salpingo oophorectomy and lysis of pelvic and abdominal adhesions  07/2011   Dr. Nori Riis    LEFT TOTAL KNEE ARTHROPLASTY (Left Knee)  07/30/2020   MASTECTOMY MODIFIED RADICAL Left 12/04/2010   W/ LEFT PAC PLACEMENT (RIGHT BREAST W/ AXILLARY CONTENTS/ NODE BX'S   PORT-A-CATH REMOVAL  09/24/2011   Procedure: REMOVAL PORT-A-CATH;  Surgeon: Joyice Faster. Cornett, MD;  Location: [facility];  Service: General;  Laterality: N/A;   PORTACATH PLACEMENT     REPLACED PAC DUE TO MALFUNCTION (LEFT)   TOTAL KNEE ARTHROPLASTY Right 08/16/2012   Procedure: TOTAL KNEE ARTHROPLASTY;  Surgeon: Kerin Salen, MD;  Location: [place];  Service: Orthopedics;  Laterality: Right;   TOTAL KNEE ARTHROPLASTY Left 07/30/2020   Procedure: LEFT TOTAL KNEE ARTHROPLASTY;  Surgeon: Leandrew Koyanagi, MD;  Location: [place];  Service: Orthopedics;  Laterality: Left;   TRANSTHORACIC ECHOCARDIOGRAM  10/29/2010   NORMAL LVF/ EF 55-60%/ MILD MV REGURG   TUBAL LIGATION  YRS AGO   VAGINAL HYSTERECTOMY  AGE 67   W/ LSO    OB History     Gravida  4   Para  4   Term  4   Preterm      AB      Living  4      SAB      IAB      Ectopic      Multiple      Live Births                Home Medications    Prior to Admission medications   Medication Sig Start Date End Date Taking? Authorizing Provider  amoxicillin (AMOXIL) 500 MG capsule Take 1 capsule (500 mg total) by mouth 2 (two) times daily for 7 days. 07/29/22 08/05/22 Yes Talbot Grumbling, FNP  Accu-Chek Softclix Lancets lancets Check blood sugar up to 7 times per week 07/22/22   Riesa Pope, MD  atorvastatin (LIPITOR) 40 MG tablet TAKE 1 TABLET BY MOUTH EVERYDAY AT BEDTIME 05/21/22   [name], Vasilios, MD  chlorthalidone (HYGROTON) 25 MG tablet TAKE 1 TABLET (25 MG TOTAL) BY MOUTH DAILY. 05/30/22   [name], Vasilios, MD  Dulaglutide (TRULICITY) [id number] [id number] SOPN Inject 0.75 mg into the skin once a week. 05/27/22   Riesa Pope, MD  empagliflozin (JARDIANCE) 10 MG TABS tablet Take 1 tablet (10 mg total) by mouth daily before breakfast. 05/27/22 02/21/23  [name], Candace Gallus, MD  fluticasone (FLONASE) 50 MCG/ACT nasal spray Place 1 spray into both nostrils daily. 12/11/21   Atway, Rayann N, DO  glipiZIDE (GLUCOTROL) 5 MG tablet Take 0.5 tablets (2.5 mg total) by mouth 2 (two) times daily. 09/05/21   Timothy Lasso, MD  glucose blood (ACCU-CHEK GUIDE) test strip Check blood sugar up to 7 times per week 07/22/22   Riesa Pope, MD  Insulin Pen Needle 32G X 4 MM MISC Use as directed with victoza 05/27/22   [name], Vasilios, MD  lisinopril (ZESTRIL) 20 MG tablet TAKE 1 TABLET BY MOUTH EVERY DAY 12/20/21   [name], Vasilios, MD  loratadine (CLARITIN) 10 MG tablet Take 1 tablet (10 mg total) by mouth daily. 09/19/21 12/18/21  Timothy Lasso, MD  metFORMIN (GLUCOPHAGE-XR) 500 MG 24 hr tablet Take 1 tablet (500 mg total) by mouth daily with breakfast. 02/19/22   [name], Vasilios, MD  Polyethyl Glycol-Propyl Glycol (SYSTANE) 0.4-0.3 % SOLN Place 1 drop into both eyes daily as needed (Dry eyes). Patient not taking: Reported on 09/02/2021    [provider]   polyethylene glycol (MIRALAX) 17 g packet Take 17 g by mouth daily. 09/05/21   Timothy Lasso, MD    Family History Family History  Problem Relation Age of Onset   Stroke Father    Breast cancer Sister    Cancer Sister        breast   Cancer Sister        COLON CANCER   Stroke Sister    Heart disease Brother     Social History Social History   Tobacco Use   Smoking status: Never   Smokeless tobacco:  Never  Vaping Use   Vaping Use: Never used  Substance Use Topics   Alcohol use: No    Alcohol/week: 0.0 standard drinks of alcohol   Drug use: No    Types: Flunitrazepam     Allergies   Hydrochlorothiazide   Review of Systems Review of Systems  HENT:  Positive for ear pain.   Per HPI   Physical Exam Triage Vital Signs ED Triage Vitals  Enc Vitals Group     BP 07/29/22 1223 132/73     Pulse Rate 07/29/22 1223 82     Resp 07/29/22 1223 16     Temp 07/29/22 1223 97.9 F (36.6 C)     Temp Source 07/29/22 1223 Oral     SpO2 07/29/22 1223 93 %     Weight --      Height --      Head Circumference --      Peak Flow --      Pain Score 07/29/22 1225 1     Pain Loc --      Pain Edu? --      Excl. in [place]? --    No data found.  Updated Vital Signs BP 132/73 (BP Location: Left Arm)   Pulse 82   Temp 97.9 F (36.6 C) (Oral)   Resp 16   SpO2 93%   Visual Acuity Right Eye Distance:   Left Eye Distance:   Bilateral Distance:    Right Eye Near:   Left Eye Near:    Bilateral Near:     Physical Exam Vitals and nursing note reviewed.  Constitutional:      Appearance: She is not ill-appearing or toxic-appearing.  HENT:     Head: Normocephalic and atraumatic.     Right Ear: Hearing and external ear normal. There is impacted cerumen.     Left Ear: Hearing, tympanic membrane, ear canal and external ear normal.     Ears:     Comments: Copious cerumen to the right ear canal obstructing view of the tympanic membrane on initial assessment. On re-check, right  ear wax removed but shows bulging and erythematous tympanic membrane to the right ear. TM intact bilaterally.     Nose: Nose normal.     Mouth/Throat:     Lips: Pink.  Eyes:     General: Lids are normal. Vision grossly intact. Gaze aligned appropriately.     Extraocular Movements: Extraocular movements intact.     Conjunctiva/sclera: Conjunctivae normal.  Cardiovascular:     Rate and Rhythm: Normal rate and regular rhythm.     Heart sounds: Normal heart sounds, S1 normal and S2 normal.  Pulmonary:     Effort: Pulmonary effort is normal. No respiratory distress.     Breath sounds: Normal breath sounds and air entry.  Musculoskeletal:     Cervical back: Neck supple.  Skin:    General: Skin is warm and dry.     Capillary Refill: Capillary refill takes less than 2 seconds.     Findings: No rash.  Neurological:     General: No focal deficit present.     Mental Status: She is alert and oriented to person, place, and time. Mental status is at baseline.     Cranial Nerves: No dysarthria or facial asymmetry.  Psychiatric:        Mood and Affect: Mood normal.        Speech: Speech normal.        Behavior: Behavior normal.  Thought Content: Thought content normal.        Judgment: Judgment normal.      UC Treatments / Results  Labs (all labs ordered are listed, but only abnormal results are displayed) Labs Reviewed - No data to display  EKG   Radiology No results found.  Procedures Procedures (including critical care time)  Medications Ordered in UC Medications - No data to display  Initial Impression / Assessment and Plan / UC Course  I have reviewed the triage vital signs and the nursing notes.  Pertinent labs & imaging results that were available during my care of the patient were reviewed by me and considered in my medical decision making (see chart for details).   1. Right otitis media, impacted cerumen of right ear Right ear lavage completed by nursing staff.  On re-assessment, presentation consistent with otitis media. Amoxicillin sent to be taken as prescribed. The patient denies allergies to antibiotics. She is to take this twice daily for 7 days. Tylenol as needed for pain. May follow-up with PCP as needed or return to urgent care if symptoms fail to improve.   Discussed physical exam and available lab work findings in clinic with patient.  Counseled patient regarding appropriate use of medications and potential side effects for all medications recommended or prescribed today. Discussed red flag signs and symptoms of worsening condition,when to call the PCP office, return to urgent care, and when to seek higher level of care in the emergency department. Patient verbalizes understanding and agreement with plan. All questions answered. Patient discharged in stable condition.    Final Clinical Impressions(s) / UC Diagnoses   Final diagnoses:  Right otitis media, unspecified otitis media type  Impacted cerumen of right ear     Discharge Instructions      After cleaning out your ears, it appears your right ear is infected.  Take amoxicillin twice a day for 7 days with food. You may take Tylenol as needed for pain.  If you develop any new or worsening symptoms or do not improve in the next 2 to 3 days, please return.  If your symptoms are severe, please go to the emergency room.  Follow-up with your primary care provider for further evaluation and management of your symptoms as well as ongoing wellness visits.  I hope you feel better!     ED Prescriptions     Medication Sig Dispense Auth. Provider   amoxicillin (AMOXIL) 500 MG capsule Take 1 capsule (500 mg total) by mouth 2 (two) times daily for 7 days. 14 capsule Talbot Grumbling, FNP      PDMP not reviewed this encounter.   Talbot Grumbling,  07/29/22 1428

## 2022-08-06 NOTE — Progress Notes (Signed)
Internal Medicine Clinic Attending  Case and documentation of Dr. Johnney Ou  soon after the resident saw the patient reviewed.  I reviewed the AWV findings.  I agree with the assessment, diagnosis, and plan of care documented in the AWV note.

## 2022-08-13 ENCOUNTER — Telehealth: Payer: Self-pay | Admitting: Dietician

## 2022-08-22 ENCOUNTER — Other Ambulatory Visit (HOSPITAL_COMMUNITY): Payer: Self-pay

## 2022-08-24 ENCOUNTER — Other Ambulatory Visit: Payer: Self-pay | Admitting: Internal Medicine

## 2022-08-24 DIAGNOSIS — E1121 Type 2 diabetes mellitus with diabetic nephropathy: Secondary | ICD-10-CM

## 2022-08-26 ENCOUNTER — Encounter: Payer: Self-pay | Admitting: Student

## 2022-08-26 ENCOUNTER — Other Ambulatory Visit (HOSPITAL_COMMUNITY): Payer: Self-pay

## 2022-08-26 ENCOUNTER — Ambulatory Visit (INDEPENDENT_AMBULATORY_CARE_PROVIDER_SITE_OTHER): Payer: Medicare Other | Admitting: Student

## 2022-08-26 VITALS — BP 111/57 | HR 87 | Wt 155.2 lb

## 2022-08-26 DIAGNOSIS — I152 Hypertension secondary to endocrine disorders: Secondary | ICD-10-CM | POA: Diagnosis not present

## 2022-08-26 DIAGNOSIS — E1159 Type 2 diabetes mellitus with other circulatory complications: Secondary | ICD-10-CM | POA: Diagnosis not present

## 2022-08-26 DIAGNOSIS — E1122 Type 2 diabetes mellitus with diabetic chronic kidney disease: Secondary | ICD-10-CM

## 2022-08-26 DIAGNOSIS — E876 Hypokalemia: Secondary | ICD-10-CM | POA: Insufficient documentation

## 2022-08-26 DIAGNOSIS — N1831 Chronic kidney disease, stage 3a: Secondary | ICD-10-CM

## 2022-08-26 DIAGNOSIS — Z794 Long term (current) use of insulin: Secondary | ICD-10-CM | POA: Diagnosis not present

## 2022-08-26 DIAGNOSIS — Z7984 Long term (current) use of oral hypoglycemic drugs: Secondary | ICD-10-CM

## 2022-08-26 DIAGNOSIS — E1121 Type 2 diabetes mellitus with diabetic nephropathy: Secondary | ICD-10-CM | POA: Diagnosis not present

## 2022-08-26 LAB — POCT GLYCOSYLATED HEMOGLOBIN (HGB A1C): Hemoglobin A1C: 8.3 % — AB (ref 4.0–5.6)

## 2022-08-26 LAB — GLUCOSE, CAPILLARY: Glucose-Capillary: 153 mg/dL — ABNORMAL HIGH (ref 70–99)

## 2022-08-26 MED ORDER — TRULICITY 1.5 MG/0.5ML ~~LOC~~ SOAJ
1.5000 mg | SUBCUTANEOUS | 2 refills | Status: DC
  Filled 2022-08-26: qty 4, 56d supply, fill #0
  Filled 2022-09-16: qty 2, 28d supply, fill #0
  Filled 2022-10-15: qty 2, 28d supply, fill #1
  Filled 2022-11-17: qty 2, 28d supply, fill #2
  Filled 2022-12-15: qty 2, 28d supply, fill #3

## 2022-08-26 NOTE — Patient Instructions (Addendum)
Thank you, [name] for allowing [place] to provide your care today. Today we discussed your diabetes, blood pressure and hypokalemia.  Your A1c has improved to 8.2%.  Congratulations on improving A1c.  Continue making changes to your diet and taking all your medications as prescribed.  I have ordered the following labs for you:   Lab Orders         Glucose, capillary         BMP8+Anion Gap         POC Hbg A1C      I will call if any are abnormal. All of your labs can be accessed through "My Chart".  I have ordered the following medication/changed the following medications:  Increase Trulicity to 1.5 mg weekly  My Chart Access: [email or web address]  Please follow-up in 3 months  Please make sure to arrive 15 minutes prior to your next appointment. If you arrive late, you may be asked to reschedule.    We look forward to seeing you next time. Please call our clinic at [phone] if you have any questions or concerns. The best time to call is Monday-Friday from 9am-4pm, but there is someone available 24/7. If after hours or the weekend, call the main hospital number and ask for the Internal Medicine Resident On-Call. If you need medication refills, please notify your pharmacy one week in advance and they will send [place] a request.   Thank you for letting us take part in your care. Wishing you the best!  Steffanie Rainwater, MD 08/26/2022, 11:10 AM IM Resident, PGY-3 [place] Heck 41:10

## 2022-08-26 NOTE — Progress Notes (Addendum)
   CC: Diabetes follow-up  HPI:  [name] is a 77 y.o. female with PMH as below who presents to clinic to follow-up on her diabetes. Please see problem based charting for evaluation, assessment and plan.  Past Medical History:  Diagnosis Date   Allergy    Anemia    Breast CA ([facility])    (Rt) breast ca dx 4/12---  S/P RADICAL MASTECTOMY AND CHEMORADIATION   Diabetes mellitus ORAL MED   Fatty liver disease, nonalcoholic    History of hyperkalemia    Hx antineoplastic chemo  10/12 - 06/24/11   PACLITAX EL COMPLETED 06/24/11   Hyperlipidemia    Hypertension    Mild nonproliferative diabetic retinopathy of right eye ([facility]) 03/19/2011   Dr. Dede Query Poudyal   Numbness and tingling    Hx: of in fingers and toes since chemotherapy   OA (osteoarthritis) of knee    RIGHT LEG   Obesity    Personal history of chemotherapy    Personal history of radiation therapy    Renal lesion     Review of Systems:  Constitutional: Negative for fever or fatigue Eyes: Negative for visual changes Respiratory: Negative for shortness of breath Cardiac: Negative for chest pain MSK: Positive for occasional lower extremity muscle spasm Abdomen: Negative for abdominal pain, constipation or diarrhea Neuro: Negative for headache, dizziness or weakness  Physical Exam: General: Pleasant, well-appearing elderly woman.  No acute distress. Cardiac: RRR. No murmurs, rubs or gallops. No LE edema Respiratory: Lungs CTAB. No wheezing or crackles. Abdominal: Soft, symmetric and non tender. Normal BS. Skin: Warm, dry and intact without rashes or lesions Extremities: Atraumatic. Full ROM. Palpable radial and DP pulses. Neuro: A&O x 3. Moves all extremities.  Normal sensation to gross touch. Psych: Appropriate mood and affect.  Vitals:   08/26/22 1045  BP: (!) 111/57  Pulse: 87  SpO2: 100%  Weight: 155 lb 3.2 oz (70.4 kg)    Assessment & Plan:   Type 2 diabetes mellitus with renal manifestations  [facility]) Patient here to follow-up on her diabetes. She was able to pick up her Jardiance and Trulicity however she was not unable to tolerate the GI effects from her metformin so she stopped taking it a few months ago. She informs me that the battery in her meter was that show she was able to check his sugar about 6 times over the last month. Meter readings show lowest of 98, highest of 183 with an average of 149 but standard deviation of 35.2. A1c today has improved to 8.2%. I will increase her Trulicity for further glycemic control.  Plan: -Increase Trulicity from 0.75 to 1.5 mg weekly -Continue Jardiance 10 mg daily -Continue glipizide 2.5 mg twice daily -Follow-up in 3 months for repeat A1c  Hypokalemia She was found to have hypokalemia of 3.1 with a creatinine of 1.07 during an ED visit in October 2023 for flank pain. Potassium was repleted but she has not had any repeat BMP since then. She does report occasional lower extremity muscle spasms mostly at night. She is currently on chlorthalidone which can decrease her potassium. -Repeat BMP  Addendum: Hypokalemia has resolved with K+ now at 3.9.   CKD stage 3a, GFR 45-59 ml/min BMP shows stable kidney function with creatinine of 1.09 and GFR of 53 with no electrolytes abnormalities.     See Encounters Tab for problem based charting.  Patient discussed with Dr.  Avis Epley, MD, MPH

## 2022-08-26 NOTE — Assessment & Plan Note (Addendum)
She was found to have hypokalemia of 3.1 with a creatinine of 1.07 during an ED visit in October 2023 for flank pain. Potassium was repleted but she has not had any repeat BMP since then. She does report occasional lower extremity muscle spasms mostly at night. She is currently on chlorthalidone which can decrease her potassium. -Repeat BMP  Addendum: Hypokalemia has resolved with K+ now at 3.9.

## 2022-08-26 NOTE — Assessment & Plan Note (Addendum)
Patient here to follow-up on her diabetes. She was able to pick up her Jardiance and Trulicity however she was not unable to tolerate the GI effects from her metformin so she stopped taking it a few months ago. She informs me that the battery in her meter was that show she was able to check his sugar about 6 times over the last month. Meter readings show lowest of 98, highest of 183 with an average of 149 but standard deviation of 35.2. A1c today has improved to 8.2%. I will increase her Trulicity for further glycemic control.  Plan: -Increase Trulicity from 0.75 to 1.5 mg weekly -Continue Jardiance 10 mg daily -Continue glipizide 2.5 mg twice daily -Follow-up in 3 months for repeat A1c

## 2022-08-27 DIAGNOSIS — N1831 Chronic kidney disease, stage 3a: Secondary | ICD-10-CM | POA: Insufficient documentation

## 2022-08-27 LAB — BMP8+ANION GAP
Anion Gap: 18 mmol/L (ref 10.0–18.0)
BUN/Creatinine Ratio: 24 (ref 12–28)
BUN: 26 mg/dL (ref 8–27)
CO2: 26 mmol/L (ref 20–29)
Calcium: 9.9 mg/dL (ref 8.7–10.3)
Chloride: 98 mmol/L (ref 96–106)
Creatinine, Ser: 1.09 mg/dL — ABNORMAL HIGH (ref 0.57–1.00)
Glucose: 178 mg/dL — ABNORMAL HIGH (ref 70–99)
Potassium: 3.9 mmol/L (ref 3.5–5.2)
Sodium: 142 mmol/L (ref 134–144)
eGFR: 53 mL/min/{1.73_m2} — ABNORMAL LOW (ref >59)

## 2022-08-27 NOTE — Progress Notes (Signed)
BMP shows stable kidney function and resolution of her hypokalemia. Unable to reach patient for results after multiple attempts.

## 2022-08-27 NOTE — Progress Notes (Signed)
Internal Medicine Clinic Attending  Case discussed with Dr. [place]  At the time of the visit.  We reviewed the resident's history and exam and pertinent patient test results.  I agree with the assessment, diagnosis, and plan of care documented in the resident's note.  

## 2022-08-27 NOTE — Assessment & Plan Note (Addendum)
BMP shows stable kidney function with creatinine of 1.09 and GFR of 53 with no electrolytes abnormalities.

## 2022-08-28 NOTE — Progress Notes (Signed)
Internal Medicine Clinic Attending  Case discussed with Dr. [place]  At the time of the visit.  We reviewed the resident's history and exam and pertinent patient test results.  I agree with the assessment, diagnosis, and plan of care documented in the resident's note.  

## 2022-09-08 ENCOUNTER — Other Ambulatory Visit (HOSPITAL_COMMUNITY): Payer: Self-pay

## 2022-09-16 ENCOUNTER — Other Ambulatory Visit (HOSPITAL_COMMUNITY): Payer: Self-pay

## 2022-09-25 DIAGNOSIS — E113293 Type 2 diabetes mellitus with mild nonproliferative diabetic retinopathy without macular edema, bilateral: Secondary | ICD-10-CM | POA: Diagnosis not present

## 2022-09-25 DIAGNOSIS — H524 Presbyopia: Secondary | ICD-10-CM | POA: Diagnosis not present

## 2022-09-25 DIAGNOSIS — H2513 Age-related nuclear cataract, bilateral: Secondary | ICD-10-CM | POA: Diagnosis not present

## 2022-09-25 LAB — HM DIABETES EYE EXAM

## 2022-09-25 NOTE — Telephone Encounter (Signed)
Call to follow up on goals of Medical Nutrition Therapy. Left voicemail for return call

## 2022-09-26 ENCOUNTER — Other Ambulatory Visit: Payer: Self-pay | Admitting: Student

## 2022-09-26 DIAGNOSIS — E1121 Type 2 diabetes mellitus with diabetic nephropathy: Secondary | ICD-10-CM

## 2022-09-26 DIAGNOSIS — I152 Hypertension secondary to endocrine disorders: Secondary | ICD-10-CM

## 2022-09-26 NOTE — Telephone Encounter (Signed)
Reviewed recent clinic notes and medication prescription. BP low normal without documentation of side effects. Refill authorized and sent.

## 2022-10-06 ENCOUNTER — Encounter: Payer: Self-pay | Admitting: Student

## 2022-10-16 ENCOUNTER — Other Ambulatory Visit (HOSPITAL_COMMUNITY): Payer: Self-pay

## 2022-11-03 ENCOUNTER — Telehealth: Payer: Self-pay | Admitting: Dietician

## 2022-11-03 DIAGNOSIS — E1121 Type 2 diabetes mellitus with diabetic nephropathy: Secondary | ICD-10-CM

## 2022-11-03 NOTE — Telephone Encounter (Signed)
Would like a referral to PREP classes

## 2022-11-03 NOTE — Telephone Encounter (Signed)
Provided diabetes support. She likes the Trulicity because she is better able to control her appetite. Eats half a plate and then the other half later. Discussed being sure to eat her protein and veggies first. Is still interested in the PREP referral.

## 2022-11-04 ENCOUNTER — Other Ambulatory Visit: Payer: Self-pay | Admitting: Student

## 2022-11-07 ENCOUNTER — Other Ambulatory Visit: Payer: Self-pay | Admitting: Student

## 2022-11-07 DIAGNOSIS — E1121 Type 2 diabetes mellitus with diabetic nephropathy: Secondary | ICD-10-CM

## 2022-11-07 NOTE — Progress Notes (Signed)
Pt. Requesting referral to PREP course.

## 2022-11-14 ENCOUNTER — Telehealth: Payer: Self-pay

## 2022-11-14 NOTE — Telephone Encounter (Signed)
Returned her call, explained PREP, she is closer to [place]; will ask Bev RN [facility] to contact her about the scheduled classes there.

## 2022-11-14 NOTE — Telephone Encounter (Signed)
Returned her call, left voicemail requesting call back.

## 2022-11-14 NOTE — Telephone Encounter (Signed)
Called re: PREP program referral, left voicemail requesting return call.  

## 2022-11-17 ENCOUNTER — Other Ambulatory Visit (HOSPITAL_COMMUNITY): Payer: Self-pay

## 2022-11-17 ENCOUNTER — Telehealth: Payer: Self-pay | Admitting: *Deleted

## 2022-11-17 NOTE — Telephone Encounter (Signed)
Contacted regarding PREP Class referral. She is interested in participating at the [facility]. Class to begin 12/02/2022 T/TH 1330-1445 for 12 weeks. Intake assessment scheduled for 11/25/2022 @ 1:45pm.

## 2022-11-21 ENCOUNTER — Encounter: Payer: Self-pay | Admitting: Dietician

## 2022-11-24 ENCOUNTER — Other Ambulatory Visit: Payer: Self-pay

## 2022-11-25 ENCOUNTER — Encounter: Payer: Self-pay | Admitting: *Deleted

## 2022-11-26 NOTE — Progress Notes (Signed)
[facility] PREP Evaluation  Patient Details  Name: [name] MRN: [id number] Date of Birth: Aug 03, 1945 Age: 77 y.o. PCP: Morrie Sheldon, MD  Vitals:   11/25/22 1400  BP: 120/66  Pulse: 94  Resp: 20  SpO2: 99%  Weight: 150 lb 6.4 oz (68.2 kg)  Height: 4\' 11"  (1.499 m)     [facility] Eval - 11/25/22 1420       [facility] "PREP" Location   [facility] "PREP" Location [facility]      Referral    Referring Provider Hoffman    Reason for referral Diabetes;Hypertension;High Cholesterol;Inactivity;Obesitity/Overweight    Program Start Date 12/02/22    Program End Date 02/19/23      Measurement   Waist Circumference 39.25 inches    Hip Circumference 42.5 inches    Body fat 39.4 percent      Information for Trainer   Goals establish an exercise routine,increase flexability and strength, healthier diet    Current Exercise none    Orthopedic Concerns Rt and Lt knee replacements    Current Barriers reports frequent car trouble    Restrictions/Precautions Diabetic snack before exercise      Mobility and Daily Activities   I find it easy to walk up or down two or more flights of stairs. 4    I have no trouble taking out the trash. 4    I do housework such as vacuuming and dusting on my own without difficulty. 4    I can easily lift a gallon of milk (8lbs). 4    I can easily walk a mile. 1    I have no trouble reaching into high cupboards or reaching down to pick up something from the floor. 3    I do not have trouble doing out-door work such as Loss adjuster, chartered, raking leaves, or gardening. 4      Mobility and Daily Activities   I feel younger than my age. 2    I feel independent. 4    I feel energetic. 2    I live an active life.  2    I feel strong. 2    I feel healthy. 2    I feel active as other people my age. 2      How fit and strong are you.   Fit and Strong Total Score 40            Past Medical History:  Diagnosis Date   Allergy    Anemia    Breast CA (HCC)     (Rt) breast ca dx 4/12---  S/P RADICAL MASTECTOMY AND CHEMORADIATION   Diabetes mellitus ORAL MED   Fatty liver disease, nonalcoholic    History of hyperkalemia    Hx antineoplastic chemo  10/12 - 06/24/11   PACLITAX EL COMPLETED 06/24/11   Hyperlipidemia    Hypertension    Mild nonproliferative diabetic retinopathy of right eye (HCC) 03/19/2011   Dr. Dede Query Poudyal   Numbness and tingling    Hx: of in fingers and toes since chemotherapy   OA (osteoarthritis) of knee    RIGHT LEG   Obesity    Personal history of chemotherapy    Personal history of radiation therapy    Renal lesion    Past Surgical History:  Procedure Laterality Date   COLONOSCOPY W/ POLYPECTOMY     Hx; of   I & D KNEE WITH POLY EXCHANGE Right 09/03/2012   Procedure: IRRIGATION AND DEBRIDEMENT RIGHT KNEE WITH POLY EXCHANGE;  Surgeon: [name], MD;  Location: [facility] OR;  Service: Orthopedics;  Laterality: Right;   JOINT REPLACEMENT     knee arthroscopy  10/28/1999   RIGHT   Laparoscopy with laparoscopic right salpingo oophorectomy and lysis of pelvic and abdominal adhesions  07/2011   Dr. Jennette Kettle    LEFT TOTAL KNEE ARTHROPLASTY (Left Knee)  07/30/2020   MASTECTOMY MODIFIED RADICAL Left 12/04/2010   W/ LEFT PAC PLACEMENT (RIGHT BREAST W/ AXILLARY CONTENTS/ NODE BX'S   PORT-A-CATH REMOVAL  09/24/2011   Procedure: REMOVAL PORT-A-CATH;  Surgeon: Clovis Pu. Cornett, MD;  Location: [facility] ORS;  Service: General;  Laterality: N/A;   PORTACATH PLACEMENT     REPLACED PAC DUE TO MALFUNCTION (LEFT)   TOTAL KNEE ARTHROPLASTY Right 08/16/2012   Procedure: TOTAL KNEE ARTHROPLASTY;  Surgeon: [name], MD;  Location: [facility] OR;  Service: Orthopedics;  Laterality: Right;   TOTAL KNEE ARTHROPLASTY Left 07/30/2020   Procedure: LEFT TOTAL KNEE ARTHROPLASTY;  Surgeon: Tarry Kos, MD;  Location: [facility] OR;  Service: Orthopedics;  Laterality: Left;   TRANSTHORACIC ECHOCARDIOGRAM  10/29/2010   NORMAL LVF/ EF 55-60%/ MILD MV REGURG   TUBAL  LIGATION  YRS AGO   VAGINAL HYSTERECTOMY  AGE 26   W/ LSO   Social History   Tobacco Use  Smoking Status Never  Smokeless Tobacco Never    Remo Lipps 11/26/2022, 12:04 PM

## 2022-12-02 ENCOUNTER — Encounter: Payer: Self-pay | Admitting: *Deleted

## 2022-12-02 NOTE — Progress Notes (Signed)
[facility] PREP Weekly Session  Patient Details  Name: [name] MRN: [id number] Date of Birth: 08-01-1945 Age: 77 y.o. PCP: Morrie Sheldon, MD  There were no vitals filed for this visit.   [facility] Weekly seesion - 12/02/22 1500       [facility] "PREP" Location   [facility] "PREP" Location [facility]      Weekly Session   Topic Discussed Goal setting and welcome to the program   Introductions, book review, perceived exertion scale, tour of facility   Classes attended to date 1             Remo Lipps 12/02/2022, 9:43 PM

## 2022-12-09 ENCOUNTER — Other Ambulatory Visit: Payer: Self-pay

## 2022-12-09 DIAGNOSIS — E785 Hyperlipidemia, unspecified: Secondary | ICD-10-CM

## 2022-12-10 ENCOUNTER — Encounter: Payer: Self-pay | Admitting: *Deleted

## 2022-12-10 DIAGNOSIS — E113293 Type 2 diabetes mellitus with mild nonproliferative diabetic retinopathy without macular edema, bilateral: Secondary | ICD-10-CM | POA: Diagnosis not present

## 2022-12-10 DIAGNOSIS — T1511XA Foreign body in conjunctival sac, right eye, initial encounter: Secondary | ICD-10-CM | POA: Diagnosis not present

## 2022-12-10 MED ORDER — ATORVASTATIN CALCIUM 40 MG PO TABS: 40.0000 mg | ORAL_TABLET | Freq: Every day | ORAL | 1 refills | Status: DC

## 2022-12-10 NOTE — Progress Notes (Signed)
[facility] PREP Weekly Session  Patient Details  Name: [name] MRN: [id number] Date of Birth: 1945/12/09 Age: 77 y.o. PCP: Morrie Sheldon, MD  Vitals:   12/09/22 1330  Weight: 150 lb 3.2 oz (68.1 kg)     [facility] Weekly seesion - 12/09/22 1500       [facility] "PREP" Location   [facility] "PREP" Location [facility]      Weekly Session   Topic Discussed Importance of resistance training;Other ways to be active   Review national standards for cardiovascular exercise: 150 minutes/week. Strength training:2-4 days/week (20-40 min sessions). Balance work and cardio machines   Minutes exercised this week 40 minutes    Classes attended to date 3             Remo Lipps 12/10/2022, 8:29 AM

## 2022-12-15 ENCOUNTER — Other Ambulatory Visit (HOSPITAL_COMMUNITY): Payer: Self-pay

## 2022-12-23 ENCOUNTER — Encounter: Payer: Self-pay | Admitting: *Deleted

## 2022-12-23 NOTE — Progress Notes (Signed)
[facility] PREP Weekly Session  Patient Details  Name: [name] MRN: [id number] Date of Birth: February 25, 1946 Age: 77 y.o. PCP: Morrie Sheldon, MD  Vitals:   12/23/22 1330  Weight: 150 lb 3.2 oz (68.1 kg)     [facility] Weekly seesion - 12/23/22 1500       [facility] "PREP" Location   [facility] "PREP" Location [facility]      Weekly Session   Topic Discussed Health habits;Water   Review healthy eating, tips for maintaining healthy lifestyle, discuss effects of sugar, tips for reducing sugar and sugar demo.Daily added sugars: 24gm/day for women, 36gm/day for men   Minutes exercised this week 150 minutes    Classes attended to date 5             Remo Lipps 12/23/2022, 8:28 PM

## 2022-12-29 ENCOUNTER — Encounter: Payer: Self-pay | Admitting: Student

## 2022-12-29 ENCOUNTER — Other Ambulatory Visit (HOSPITAL_COMMUNITY): Payer: Self-pay

## 2022-12-29 ENCOUNTER — Ambulatory Visit (INDEPENDENT_AMBULATORY_CARE_PROVIDER_SITE_OTHER): Payer: Medicare Other | Admitting: Student

## 2022-12-29 VITALS — BP 110/66 | HR 100 | Temp 98.2°F | Ht 59.0 in | Wt 149.4 lb

## 2022-12-29 DIAGNOSIS — E1129 Type 2 diabetes mellitus with other diabetic kidney complication: Secondary | ICD-10-CM | POA: Diagnosis not present

## 2022-12-29 DIAGNOSIS — Z7984 Long term (current) use of oral hypoglycemic drugs: Secondary | ICD-10-CM

## 2022-12-29 DIAGNOSIS — E1159 Type 2 diabetes mellitus with other circulatory complications: Secondary | ICD-10-CM | POA: Diagnosis not present

## 2022-12-29 DIAGNOSIS — Z7985 Long-term (current) use of injectable non-insulin antidiabetic drugs: Secondary | ICD-10-CM

## 2022-12-29 DIAGNOSIS — E785 Hyperlipidemia, unspecified: Secondary | ICD-10-CM

## 2022-12-29 DIAGNOSIS — E1121 Type 2 diabetes mellitus with diabetic nephropathy: Secondary | ICD-10-CM | POA: Diagnosis not present

## 2022-12-29 DIAGNOSIS — I152 Hypertension secondary to endocrine disorders: Secondary | ICD-10-CM

## 2022-12-29 LAB — GLUCOSE, CAPILLARY: Glucose-Capillary: 127 mg/dL — ABNORMAL HIGH (ref 70–99)

## 2022-12-29 LAB — POCT GLYCOSYLATED HEMOGLOBIN (HGB A1C): Hemoglobin A1C: 7 % — AB (ref 4.0–5.6)

## 2022-12-29 MED ORDER — CHLORTHALIDONE 25 MG PO TABS
25.0000 mg | ORAL_TABLET | Freq: Every day | ORAL | 1 refills | Status: DC
Start: 2022-12-29 — End: 2023-06-28

## 2022-12-29 MED ORDER — LISINOPRIL 20 MG PO TABS
20.0000 mg | ORAL_TABLET | Freq: Every day | ORAL | 2 refills | Status: DC
Start: 2022-12-29 — End: 2023-12-21

## 2022-12-29 MED ORDER — ATORVASTATIN CALCIUM 40 MG PO TABS: 40.0000 mg | ORAL_TABLET | Freq: Every day | ORAL | 1 refills | Status: DC

## 2022-12-29 MED ORDER — TRULICITY 1.5 MG/0.5ML ~~LOC~~ SOAJ
1.5000 mg | SUBCUTANEOUS | 2 refills | Status: DC
Start: 2022-12-29 — End: 2023-07-07
  Filled 2022-12-29 – 2023-01-12 (×2): qty 4, 56d supply, fill #0
  Filled 2023-03-16: qty 4, 56d supply, fill #1
  Filled 2023-05-08: qty 4, 56d supply, fill #2

## 2022-12-29 MED ORDER — GLIPIZIDE 5 MG PO TABS
2.5000 mg | ORAL_TABLET | Freq: Two times a day (BID) | ORAL | 2 refills | Status: DC
Start: 2022-12-29 — End: 2023-07-03

## 2022-12-29 MED ORDER — EMPAGLIFLOZIN 10 MG PO TABS
10.0000 mg | ORAL_TABLET | Freq: Every day | ORAL | 2 refills | Status: DC
Start: 2022-12-29 — End: 2024-02-02
  Filled 2022-12-29 – 2023-04-07 (×2): qty 90, 90d supply, fill #0
  Filled 2023-07-20: qty 90, 90d supply, fill #1
  Filled 2023-10-28: qty 90, 90d supply, fill #2

## 2022-12-29 NOTE — Assessment & Plan Note (Signed)
A: Patient is here follow-up on her diabetes.  She notes that she checks her meter 2-3 times per week.  Last time she checked it was a couple days ago and it was 110.  She notes having a value in the 70s approximately once every 2 weeks.  She denies any other signs or symptoms at this time. Foot exam was normal. P: - F/U A1C and microalbumin F/U in 4 months  - Continue to monitor blood glucose values

## 2022-12-29 NOTE — Patient Instructions (Addendum)
Thank you so much for coming to the clinic today!   Today, we checked in on your diabetes.  Please continue to monitor your glucose at home.  We also refilled your medications.  If you have any questions please feel free to the call the clinic at anytime at [phone]. It was a pleasure seeing you!  Best, Dr. Rayvon Char

## 2022-12-29 NOTE — Progress Notes (Unsigned)
Established Patient Office Visit  Subjective   Patient ID: [name], female    DOB: 12/13/1945  Age: 77 y.o. MRN: [id number]  Chief Complaint  Patient presents with   Follow-up    ROUTINE OFFICE VISIT / TO MEET NEW DR / MEDICATION REFILL- ALL MEDS.   [name] is a 77 YO F with a past medical history as stated below who presents today for follow-up on diabetes.  Please see problem-based assessment and plan for additional details.    Review of Systems  Constitutional: Negative.   HENT: Negative.    Eyes: Negative.   Respiratory: Negative.    Cardiovascular: Negative.   Gastrointestinal: Negative.   Genitourinary: Negative.   Musculoskeletal: Negative.   Skin: Negative.   Neurological: Negative.   Endo/Heme/Allergies: Negative.   Psychiatric/Behavioral: Negative.       Objective:     BP 110/66 (BP Location: Left Arm, Patient Position: Sitting, Cuff Size: Normal)   Pulse 100   Temp 98.2 F (36.8 C) (Oral)   Ht 4\' 11"  (1.499 m)   Wt 149 lb 6.4 oz (67.8 kg)   SpO2 99%   BMI 30.18 kg/m   Physical Exam Constitutional:      Appearance: Normal appearance.  HENT:     Head: Normocephalic and atraumatic.  Cardiovascular:     Rate and Rhythm: Normal rate and regular rhythm.  Pulmonary:     Effort: Pulmonary effort is normal.     Breath sounds: Normal breath sounds.  Abdominal:     General: Abdomen is flat. Bowel sounds are normal.     Palpations: Abdomen is soft.  Neurological:     Mental Status: She is alert.    The 10-year ASCVD risk score (Arnett DK, et al., 2019) is: 16.5%    Assessment & Plan:   Problem List Items Addressed This Visit       Cardiovascular and Mediastinum   Hypertension associated with diabetes (HCC) (Chronic)   Relevant Medications   atorvastatin (LIPITOR) 40 MG tablet   chlorthalidone (HYGROTON) 25 MG tablet   Dulaglutide (TRULICITY) 1.5 MG/0.5ML SOPN   empagliflozin (JARDIANCE) 10 MG TABS tablet   glipiZIDE  (GLUCOTROL) 5 MG tablet   lisinopril (ZESTRIL) 20 MG tablet     Endocrine   Type 2 diabetes mellitus with renal manifestations (HCC) - Primary (Chronic)    A: Patient is here follow-up on her diabetes.  She notes that she checks her meter 2-3 times per week.  Last time she checked it was a couple days ago and it was 110.  She notes having a value in the 70s approximately once every 2 weeks.  She denies any other signs or symptoms at this time. Foot exam was normal. P: - F/U A1C and microalbumin F/U in 4 months  - Continue to monitor blood glucose values       Relevant Medications   atorvastatin (LIPITOR) 40 MG tablet   Dulaglutide (TRULICITY) 1.5 MG/0.5ML SOPN   empagliflozin (JARDIANCE) 10 MG TABS tablet   glipiZIDE (GLUCOTROL) 5 MG tablet   lisinopril (ZESTRIL) 20 MG tablet   Other Relevant Orders   POC Hbg A1C   Microalbumin / Creatinine Urine Ratio     Other   Hyperlipidemia (Chronic)   Relevant Medications   atorvastatin (LIPITOR) 40 MG tablet   chlorthalidone (HYGROTON) 25 MG tablet   lisinopril (ZESTRIL) 20 MG tablet    Return in about 4 months (around 04/30/2023) for Diabetes follow-up .  Morrie Sheldon, MD

## 2022-12-30 LAB — MICROALBUMIN / CREATININE URINE RATIO
Creatinine, Urine: 93 mg/dL
Microalb/Creat Ratio: 16 mg/g{creat} (ref 0–29)
Microalbumin, Urine: 14.7 ug/mL

## 2022-12-30 NOTE — Progress Notes (Signed)
Attempted to contact patient this morning regarding her lab results but did not get an answer. Second attempt this afternoon to contact patient regarding her lab results but was sent to voice mail. Will try again tomorrow.

## 2022-12-31 ENCOUNTER — Encounter: Payer: Self-pay | Admitting: *Deleted

## 2022-12-31 NOTE — Progress Notes (Signed)
[facility] PREP Weekly Session  Patient Details  Name: [name] MRN: [id number] Date of Birth: Mar 25, 1946 Age: 77 y.o. PCP: Morrie Sheldon, MD  There were no vitals filed for this visit.   [facility] Weekly seesion - 12/31/22 1500       [facility] "PREP" Location   [facility] "PREP" Location [facility]      Weekly Session   Topic Discussed Restaurant Eating;Eating for the season   Discussed limiting sodium intake to 1500-2300mg /day. Tips to reduce sodium intake, salt demo. Reading labels on food products and what to look for.   Minutes exercised this week 75 minutes    Classes attended to date 7             Remo Lipps 12/31/2022, 8:50 PM

## 2023-01-01 ENCOUNTER — Other Ambulatory Visit: Payer: Self-pay

## 2023-01-01 ENCOUNTER — Encounter (HOSPITAL_BASED_OUTPATIENT_CLINIC_OR_DEPARTMENT_OTHER): Payer: Self-pay | Admitting: Emergency Medicine

## 2023-01-01 ENCOUNTER — Emergency Department (HOSPITAL_BASED_OUTPATIENT_CLINIC_OR_DEPARTMENT_OTHER)
Admission: EM | Admit: 2023-01-01 | Discharge: 2023-01-01 | Disposition: A | Discharge status: Home / Self Care | Payer: Medicare Other | Attending: Emergency Medicine | Admitting: Emergency Medicine

## 2023-01-01 DIAGNOSIS — M5441 Lumbago with sciatica, right side: Secondary | ICD-10-CM | POA: Insufficient documentation

## 2023-01-01 DIAGNOSIS — M545 Low back pain, unspecified: Secondary | ICD-10-CM | POA: Diagnosis present

## 2023-01-01 DIAGNOSIS — G8929 Other chronic pain: Secondary | ICD-10-CM | POA: Diagnosis not present

## 2023-01-01 LAB — COMPREHENSIVE METABOLIC PANEL
ALT: 21 U/L (ref 0–44)
AST: 18 U/L (ref 15–41)
Albumin: 4.4 g/dL (ref 3.5–5.0)
Alkaline Phosphatase: 66 U/L (ref 38–126)
Anion gap: 10 (ref 5–15)
BUN: 18 mg/dL (ref 8–23)
CO2: 31 mmol/L (ref 22–32)
Calcium: 9.6 mg/dL (ref 8.9–10.3)
Chloride: 101 mmol/L (ref 98–111)
Creatinine, Ser: 1.01 mg/dL — ABNORMAL HIGH (ref 0.44–1.00)
GFR, Estimated: 58 mL/min — ABNORMAL LOW (ref >60)
Glucose, Bld: 117 mg/dL — ABNORMAL HIGH (ref 70–99)
Potassium: 3.1 mmol/L — ABNORMAL LOW (ref 3.5–5.1)
Sodium: 142 mmol/L (ref 135–145)
Total Bilirubin: 0.6 mg/dL (ref 0.3–1.2)
Total Protein: 7 g/dL (ref 6.5–8.1)

## 2023-01-01 LAB — URINALYSIS, W/ REFLEX TO CULTURE (INFECTION SUSPECTED)
Bacteria, UA: NONE SEEN
Bilirubin Urine: NEGATIVE
Glucose, UA: >1000 mg/dL — AB
Hgb urine dipstick: NEGATIVE
Ketones, ur: NEGATIVE mg/dL
Nitrite: NEGATIVE
Protein, ur: NEGATIVE mg/dL
Specific Gravity, Urine: 1.025 (ref 1.005–1.030)
pH: 6.5 (ref 5.0–8.0)

## 2023-01-01 LAB — CBC WITH DIFFERENTIAL/PLATELET
Abs Immature Granulocytes: 0.01 10*3/uL (ref 0.00–0.07)
Basophils Absolute: 0 10*3/uL (ref 0.0–0.1)
Basophils Relative: 1 %
Eosinophils Absolute: 0.1 10*3/uL (ref 0.0–0.5)
Eosinophils Relative: 2 %
HCT: 36.3 % (ref 36.0–46.0)
Hemoglobin: 12.3 g/dL (ref 12.0–15.0)
Immature Granulocytes: 0 %
Lymphocytes Relative: 36 %
Lymphs Abs: 2.2 10*3/uL (ref 0.7–4.0)
MCH: 32.1 pg (ref 26.0–34.0)
MCHC: 33.9 g/dL (ref 30.0–36.0)
MCV: 94.8 fL (ref 80.0–100.0)
Monocytes Absolute: 0.5 10*3/uL (ref 0.1–1.0)
Monocytes Relative: 8 %
Neutro Abs: 3.3 10*3/uL (ref 1.7–7.7)
Neutrophils Relative %: 53 %
Platelets: 186 10*3/uL (ref 150–400)
RBC: 3.83 MIL/uL — ABNORMAL LOW (ref 3.87–5.11)
RDW: 16.3 % — ABNORMAL HIGH (ref 11.5–15.5)
WBC: 6.1 10*3/uL (ref 4.0–10.5)
nRBC: 0 % (ref 0.0–0.2)

## 2023-01-01 LAB — LIPASE, BLOOD: Lipase: 79 U/L — ABNORMAL HIGH (ref 11–51)

## 2023-01-01 MED ORDER — ACETAMINOPHEN 325 MG PO TABS
650.0000 mg | ORAL_TABLET | Freq: Once | ORAL | Status: AC
  Administered 2023-01-01: 650 mg via ORAL
  Filled 2023-01-01: qty 2

## 2023-01-01 NOTE — ED Notes (Signed)
Reviewed AVS with patient, patient expressed understanding of directions, denies further questions at this time. 

## 2023-01-01 NOTE — Progress Notes (Signed)
 Internal Medicine Clinic Attending  I was physically present during the key portions of the resident provided service and participated in the medical decision making of patient's management care. I reviewed pertinent patient test results.  The assessment, diagnosis, and plan were formulated together and I agree with the documentation in the resident's note.  Mercie Eon, MD

## 2023-01-01 NOTE — ED Provider Notes (Signed)
[place] EMERGENCY DEPARTMENT AT [facility]  Provider Note  CSN: [id number] Arrival date & time: 01/01/23 0532  History Chief Complaint  Patient presents with   Back Pain    [name] is a 77 y.o. female brought to the ED by husband for evaluation of R lower back pain which radiates into her R hip and RLQ abdomen. She reports symptoms have been ongoing for months, worse in the morning. She feels stiff when she wakes up, has trouble getting around but improves as she gets moving through the day. She denies any fever, dysuria, or hematuria. She has been to see her doctor for other reasons, but did not mention this pain. She was seen in the ED for similar in Oct 2023 where labs showed a possible UTI and CT showed a, presumably, chronic ovarian vein thrombus.    Home Medications Prior to Admission medications   Medication Sig Start Date End Date Taking? Authorizing Provider  Accu-Chek Softclix Lancets lancets Check blood sugar up to 7 times per week 07/22/22   Belva Agee, MD  atorvastatin (LIPITOR) 40 MG tablet Take 1 tablet (40 mg total) by mouth daily. 12/29/22 03/29/23  Morrie Wiletta Bermingham, MD  chlorthalidone (HYGROTON) 25 MG tablet Take 1 tablet (25 mg total) by mouth daily. 12/29/22   Morrie Kie Calvin, MD  Dulaglutide (TRULICITY) 1.5 MG/0.5ML SOPN Inject 1.5 mg into the skin once a week. 12/29/22   Morrie Delita Chiquito, MD  empagliflozin (JARDIANCE) 10 MG TABS tablet Take 1 tablet (10 mg total) by mouth daily before breakfast. 12/29/22   Morrie Jaydence Arnesen, MD  fluticasone (FLONASE) 50 MCG/ACT nasal spray Place 1 spray into both nostrils daily. 12/11/21   Atway, Rayann N, DO  glipiZIDE (GLUCOTROL) 5 MG tablet Take 0.5 tablets (2.5 mg total) by mouth 2 (two) times daily. 12/29/22   Morrie Kersten Salmons, MD  glucose blood (ACCU-CHEK GUIDE) test strip Check blood sugar up to 7 times per week 07/22/22   Belva Agee, MD  Insulin Pen Needle 32G X 4 MM MISC Use as directed with victoza  05/27/22   [name], Vasilios, MD  lisinopril (ZESTRIL) 20 MG tablet Take 1 tablet (20 mg total) by mouth daily. 12/29/22   Morrie Dmonte Maher, MD  loratadine (CLARITIN) 10 MG tablet Take 1 tablet (10 mg total) by mouth daily. 09/19/21 12/18/21  Dellis Filbert, MD  polyethylene glycol (MIRALAX) 17 g packet Take 17 g by mouth daily. 09/05/21   Dellis Filbert, MD     Allergies    Hydrochlorothiazide   Review of Systems   Review of Systems Please see HPI for pertinent positives and negatives  Physical Exam BP 123/76   Pulse 75   Temp 98.1 F (36.7 C) (Oral)   Resp 18   Wt 67.6 kg   SpO2 96%   BMI 30.09 kg/m   Physical Exam Vitals and nursing note reviewed.  Constitutional:      Appearance: Normal appearance.  HENT:     Head: Normocephalic and atraumatic.     Nose: Nose normal.     Mouth/Throat:     Mouth: Mucous membranes are moist.  Eyes:     Extraocular Movements: Extraocular movements intact.     Conjunctiva/sclera: Conjunctivae normal.  Cardiovascular:     Rate and Rhythm: Normal rate.  Pulmonary:     Effort: Pulmonary effort is normal.     Breath sounds: Normal breath sounds.  Abdominal:     General: Abdomen is flat.     Palpations: Abdomen is  soft.     Tenderness: There is no abdominal tenderness. There is no guarding. Negative signs include Murphy's sign and McBurney's sign.  Musculoskeletal:        General: Tenderness (mild tenderness R lower back) present. No swelling. Normal range of motion.     Cervical back: Neck supple.  Skin:    General: Skin is warm and dry.  Neurological:     General: No focal deficit present.     Mental Status: She is alert.  Psychiatric:        Mood and Affect: Mood normal.     ED Results / Procedures / Treatments   EKG None  Procedures Procedures  Medications Ordered in the ED Medications  acetaminophen (TYLENOL) tablet 650 mg (650 mg Oral Given 01/01/23 0559)    Initial Impression and Plan  Patient here with  chronic R lower back pain, with some radiation into R hip and RLQ abdomen. Exam is benign. Pain seems to be worse first thing in the morning when she feels very stiff, but improved later in the day. Exam is benign. Suspect this is MSK pain, particularly given the duration. Will check labs to rule out infectious or other intra-abdominal etiology.   ED Course   Clinical Course as of 01/01/23 0647  Thu Jan 01, 2023  0624 CBC is normal. UA with glucosuria, but no convincing signs of infection.  [CS]  0641 CMP is unremarkable. Lipase is mildly elevated, similar to previous and not likely contributing to her symptoms.  [CS]  [phone] Patient reports pain is improved. Suspect this is MSK pain and/or some degree of sciatica. Recommend she continue APAP or Motrin for pain as needed. Follow up with PCP for long term management. RTED for any other concerns. [CS]    Clinical Course User Index [CS] Pollyann Savoy, MD     MDM Rules/Calculators/A&P Medical Decision Making Problems Addressed: Chronic right-sided low back pain with right-sided sciatica: acute illness or injury  Amount and/or Complexity of Data Reviewed Labs: ordered. Decision-making details documented in ED Course.  Risk OTC drugs.     Final Clinical Impression(s) / ED Diagnoses Final diagnoses:  Chronic right-sided low back pain with right-sided sciatica    Rx / DC Orders ED Discharge Orders     None        Pollyann Savoy, MD 01/01/23 [phone]

## 2023-01-01 NOTE — ED Triage Notes (Signed)
Pt in with low back, R hip and RLQ pain. Pt states all of these symptoms have been going on for months, and pain is always worse when she first wakes up.

## 2023-01-02 ENCOUNTER — Encounter (HOSPITAL_COMMUNITY): Payer: Self-pay

## 2023-01-02 ENCOUNTER — Ambulatory Visit (HOSPITAL_COMMUNITY)
Admission: EM | Admit: 2023-01-02 | Discharge: 2023-01-02 | Disposition: A | Discharge status: Home / Self Care | Payer: Medicare Other | Attending: Family Medicine | Admitting: Family Medicine

## 2023-01-02 DIAGNOSIS — M5441 Lumbago with sciatica, right side: Secondary | ICD-10-CM | POA: Diagnosis not present

## 2023-01-02 DIAGNOSIS — G8929 Other chronic pain: Secondary | ICD-10-CM | POA: Diagnosis not present

## 2023-01-02 MED ORDER — METHYLPREDNISOLONE 4 MG PO TBPK: ORAL_TABLET | ORAL | 0 refills | Status: DC

## 2023-01-02 NOTE — Discharge Instructions (Signed)
You were seen today for back pain.  I have sent out a steroid pack for you.  You may continue tylenol and an ice pack for pain as well.  Please follow up with your primary care provider if you have further pain/issues.

## 2023-01-02 NOTE — ED Triage Notes (Signed)
Pt presents to the office for lower back pain. Pt was seen in the ED on 01/01/2023. Pt states symptoms have been going on for months.

## 2023-01-02 NOTE — ED Provider Notes (Signed)
[facility]    CSN: [id number] Arrival date & time: 01/02/23  0801      History   Chief Complaint No chief complaint on file.   HPI [name] is a 77 y.o. female.   Patient is here for right sided back pain, that radiates into the right leg.  This has been going on for a while.  Pain comes and goes, worse in the morning.  It does improve the more she is on it.  She has neuropathy anyway, but no new numbness down the leg.  She did go to the ER yesterday, no medications given.  She did see her pcp several days ago, but did not discuss.  She has been taking tylenol.        Past Medical History:  Diagnosis Date   Allergy    Anemia    Breast CA (HCC)    (Rt) breast ca dx 4/12---  S/P RADICAL MASTECTOMY AND CHEMORADIATION   Diabetes mellitus ORAL MED   Fatty liver disease, nonalcoholic    History of hyperkalemia    Hx antineoplastic chemo  10/12 - 06/24/11   PACLITAX EL COMPLETED 06/24/11   Hyperlipidemia    Hypertension    Mild nonproliferative diabetic retinopathy of right eye (HCC) 03/19/2011   Dr. Dede Query Poudyal   Numbness and tingling    Hx: of in fingers and toes since chemotherapy   OA (osteoarthritis) of knee    RIGHT LEG   Obesity    Personal history of chemotherapy    Personal history of radiation therapy    Renal lesion     Patient Active Problem List   Diagnosis Date Noted   CKD stage 3a, GFR 45-59 ml/min (HCC) 08/27/2022   Hypokalemia 08/26/2022   Nausea 09/24/2020   Status post total left knee replacement 07/30/2020   Fatigue 02/10/2019   Primary osteoarthritis of left knee 01/25/2019   Cervical neuropathy 08/20/2018   Bilateral carpal tunnel syndrome 06/29/2018   Depressed mood 01/21/2018   Need for immunization against influenza 01/17/2018   Osteopenia 12/13/2013   Vaginal dryness 12/13/2013   Peripheral neuropathy 11/02/2013   Health care maintenance 11/17/2012   Arthritis of knee, right 08/16/2012   Breast cancer,  right breast (HCC) 09/25/2010   COLONIC POLYPS, HX OF 07/19/2007   OBESITY NOS 07/09/2006   Type 2 diabetes mellitus with renal manifestations (HCC) 03/10/2006   Hyperlipidemia 03/10/2006   Hypertension associated with diabetes (HCC) 03/10/2006   Fatty liver 03/10/2006    Past Surgical History:  Procedure Laterality Date   COLONOSCOPY W/ POLYPECTOMY     Hx; of   I & D KNEE WITH POLY EXCHANGE Right 09/03/2012   Procedure: IRRIGATION AND DEBRIDEMENT RIGHT KNEE WITH POLY EXCHANGE;  Surgeon: [name], MD;  Location: [facility];  Service: Orthopedics;  Laterality: Right;   JOINT REPLACEMENT     knee arthroscopy  10/28/1999   RIGHT   Laparoscopy with laparoscopic right salpingo oophorectomy and lysis of pelvic and abdominal adhesions  07/2011   Dr. Jennette Kettle    LEFT TOTAL KNEE ARTHROPLASTY (Left Knee)  07/30/2020   MASTECTOMY MODIFIED RADICAL Left 12/04/2010   W/ LEFT PAC PLACEMENT (RIGHT BREAST W/ AXILLARY CONTENTS/ NODE BX'S   PORT-A-CATH REMOVAL  09/24/2011   Procedure: REMOVAL PORT-A-CATH;  Surgeon: Clovis Pu. Cornett, MD;  Location: [facility];  Service: General;  Laterality: N/A;   PORTACATH PLACEMENT     REPLACED PAC DUE TO MALFUNCTION (LEFT)   TOTAL KNEE ARTHROPLASTY  Right 08/16/2012   Procedure: TOTAL KNEE ARTHROPLASTY;  Surgeon: [name], MD;  Location: [facility] OR;  Service: Orthopedics;  Laterality: Right;   TOTAL KNEE ARTHROPLASTY Left 07/30/2020   Procedure: LEFT TOTAL KNEE ARTHROPLASTY;  Surgeon: Tarry Kos, MD;  Location: [facility];  Service: Orthopedics;  Laterality: Left;   TRANSTHORACIC ECHOCARDIOGRAM  10/29/2010   NORMAL LVF/ EF 55-60%/ MILD MV REGURG   TUBAL LIGATION  YRS AGO   VAGINAL HYSTERECTOMY  AGE 54   W/ LSO    OB History     Gravida  4   Para  4   Term  4   Preterm      AB      Living  4      SAB      IAB      Ectopic      Multiple      Live Births               Home Medications    Prior to Admission medications   Medication Sig  Start Date End Date Taking? Authorizing Provider  Accu-Chek Softclix Lancets lancets Check blood sugar up to 7 times per week 07/22/22  Yes [name], Vasilios, MD  atorvastatin (LIPITOR) 40 MG tablet Take 1 tablet (40 mg total) by mouth daily. 12/29/22 03/29/23 Yes Morrie Sheldon, MD  chlorthalidone (HYGROTON) 25 MG tablet Take 1 tablet (25 mg total) by mouth daily. 12/29/22  Yes Morrie Sheldon, MD  Dulaglutide (TRULICITY) 1.5 MG/0.5ML SOPN Inject 1.5 mg into the skin once a week. 12/29/22  Yes Morrie Sheldon, MD  empagliflozin (JARDIANCE) 10 MG TABS tablet Take 1 tablet (10 mg total) by mouth daily before breakfast. 12/29/22  Yes Morrie Sheldon, MD  fluticasone (FLONASE) 50 MCG/ACT nasal spray Place 1 spray into both nostrils daily. 12/11/21  Yes Atway, Rayann N, DO  glipiZIDE (GLUCOTROL) 5 MG tablet Take 0.5 tablets (2.5 mg total) by mouth 2 (two) times daily. 12/29/22  Yes Morrie Sheldon, MD  glucose blood (ACCU-CHEK GUIDE) test strip Check blood sugar up to 7 times per week 07/22/22  Yes [name], Vasilios, MD  Insulin Pen Needle 32G X 4 MM MISC Use as directed with victoza 05/27/22  Yes [name], Vasilios, MD  lisinopril (ZESTRIL) 20 MG tablet Take 1 tablet (20 mg total) by mouth daily. 12/29/22  Yes Morrie Sheldon, MD  polyethylene glycol (MIRALAX) 17 g packet Take 17 g by mouth daily. 09/05/21  Yes Dellis Filbert, MD  loratadine (CLARITIN) 10 MG tablet Take 1 tablet (10 mg total) by mouth daily. 09/19/21 12/18/21  Dellis Filbert, MD    Family History Family History  Problem Relation Age of Onset   Stroke Father    Breast cancer Sister    Cancer Sister        breast   Cancer Sister        COLON CANCER   Stroke Sister    Heart disease Brother     Social History Social History   Tobacco Use   Smoking status: Never   Smokeless tobacco: Never  Vaping Use   Vaping status: Never Used  Substance Use Topics   Alcohol use: No    Alcohol/week: 0.0 standard drinks of alcohol   Drug  use: No    Types: Flunitrazepam     Allergies   Hydrochlorothiazide   Review of Systems Review of Systems  Constitutional: Negative.   HENT: Negative.    Respiratory: Negative.    Cardiovascular: Negative.  Gastrointestinal: Negative.   Musculoskeletal:  Positive for back pain.     Physical Exam Triage Vital Signs ED Triage Vitals [01/02/23 0816]  Encounter Vitals Group     BP 117/86     Systolic BP Percentile      Diastolic BP Percentile      Pulse Rate 75     Resp 16     Temp 98.5 F (36.9 C)     Temp Source Oral     SpO2 94 %     Weight      Height      Head Circumference      Peak Flow      Pain Score      Pain Loc      Pain Education      Exclude from Growth Chart    No data found.  Updated Vital Signs BP 117/86 (BP Location: Left Arm)   Pulse 75   Temp 98.5 F (36.9 C) (Oral)   Resp 16   SpO2 94%   Visual Acuity Right Eye Distance:   Left Eye Distance:   Bilateral Distance:    Right Eye Near:   Left Eye Near:    Bilateral Near:     Physical Exam Constitutional:      Appearance: Normal appearance.  Cardiovascular:     Rate and Rhythm: Normal rate and regular rhythm.  Pulmonary:     Effort: Pulmonary effort is normal.     Breath sounds: Normal breath sounds.  Musculoskeletal:        General: Tenderness present. No swelling or deformity. Normal range of motion.  Skin:    General: Skin is warm and dry.  Neurological:     General: No focal deficit present.     Mental Status: She is alert and oriented to person, place, and time.     Motor: No weakness.     Coordination: Coordination normal.  Psychiatric:        Mood and Affect: Mood normal.        Behavior: Behavior normal.      UC Treatments / Results  Labs (all labs ordered are listed, but only abnormal results are displayed) Labs Reviewed - No data to display  EKG   Radiology No results found.  Procedures Procedures (including critical care time)  Medications  Ordered in UC Medications - No data to display  Initial Impression / Assessment and Plan / UC Course  I have reviewed the triage vital signs and the nursing notes.  Pertinent labs & imaging results that were available during my care of the patient were reviewed by me and considered in my medical decision making (see chart for details).   Final Clinical Impressions(s) / UC Diagnoses   Final diagnoses:  Chronic right-sided low back pain with right-sided sciatica     Discharge Instructions      You were seen today for back pain.  I have sent out a steroid pack for you.  You may continue tylenol and an ice pack for pain as well.  Please follow up with your primary care provider if you have further pain/issues.     ED Prescriptions     Medication Sig Dispense Auth. Provider   methylPREDNISolone (MEDROL DOSEPAK) 4 MG TBPK tablet Take as directed 1 each Jannifer Franklin, MD      PDMP not reviewed this encounter.   Jannifer Franklin, MD 01/02/23 [phone]

## 2023-01-06 ENCOUNTER — Encounter: Payer: Self-pay | Admitting: *Deleted

## 2023-01-06 NOTE — Progress Notes (Signed)
[facility] PREP Weekly Session  Patient Details  Name: [name] MRN: [id number] Date of Birth: 1945/09/16 Age: 77 y.o. PCP: Morrie Sheldon, MD  Vitals:   01/06/23 2053  Weight: 150 lb (68 kg)     [facility] Weekly seesion - 01/06/23 1500       [facility] "PREP" Location   [facility] "PREP" Location [facility]      Weekly Session   Topic Discussed Stress management and problem solving   Importance of sleep, tips for improving sleep, introduction to meditation, assign revisit goals page in preparation for half way through the program.   Classes attended to date 8             Remo Lipps 01/06/2023, 8:55 PM

## 2023-01-12 ENCOUNTER — Other Ambulatory Visit (HOSPITAL_COMMUNITY): Payer: Self-pay

## 2023-01-13 ENCOUNTER — Encounter: Payer: Self-pay | Admitting: *Deleted

## 2023-01-13 NOTE — Progress Notes (Signed)
[facility] PREP Weekly Session  Patient Details  Name: [name] MRN: [id number] Date of Birth: 03/16/1946 Age: 77 y.o. PCP: Morrie Sheldon, MD  Vitals:   01/13/23 1330  Weight: 149 lb 9.6 oz (67.9 kg)     [facility] Weekly seesion - 01/13/23 1500       [facility] "PREP" Location   [facility] "PREP" Location [facility]      Weekly Session   Topic Discussed Expectations and non-scale victories   Staying motivated, assessing progress toward goals, staying positive. Discussed Blue Zones and their commonalities in healthy living.   Minutes exercised this week 75 minutes    Classes attended to date 39             Remo Lipps 01/13/2023, 9:04 PM

## 2023-01-16 ENCOUNTER — Emergency Department (HOSPITAL_COMMUNITY): Payer: Medicare Other

## 2023-01-16 ENCOUNTER — Emergency Department (HOSPITAL_COMMUNITY)
Admission: EM | Admit: 2023-01-16 | Discharge: 2023-01-16 | Disposition: A | Discharge status: Home / Self Care | Payer: Medicare Other | Attending: Emergency Medicine | Admitting: Emergency Medicine

## 2023-01-16 ENCOUNTER — Other Ambulatory Visit: Payer: Self-pay

## 2023-01-16 DIAGNOSIS — M5442 Lumbago with sciatica, left side: Secondary | ICD-10-CM | POA: Diagnosis not present

## 2023-01-16 DIAGNOSIS — E119 Type 2 diabetes mellitus without complications: Secondary | ICD-10-CM | POA: Insufficient documentation

## 2023-01-16 DIAGNOSIS — K5730 Diverticulosis of large intestine without perforation or abscess without bleeding: Secondary | ICD-10-CM | POA: Diagnosis not present

## 2023-01-16 DIAGNOSIS — M5441 Lumbago with sciatica, right side: Secondary | ICD-10-CM | POA: Diagnosis not present

## 2023-01-16 DIAGNOSIS — M4317 Spondylolisthesis, lumbosacral region: Secondary | ICD-10-CM | POA: Diagnosis not present

## 2023-01-16 DIAGNOSIS — R109 Unspecified abdominal pain: Secondary | ICD-10-CM | POA: Diagnosis not present

## 2023-01-16 DIAGNOSIS — Z7984 Long term (current) use of oral hypoglycemic drugs: Secondary | ICD-10-CM | POA: Insufficient documentation

## 2023-01-16 DIAGNOSIS — Z853 Personal history of malignant neoplasm of breast: Secondary | ICD-10-CM | POA: Diagnosis not present

## 2023-01-16 DIAGNOSIS — R748 Abnormal levels of other serum enzymes: Secondary | ICD-10-CM | POA: Diagnosis not present

## 2023-01-16 DIAGNOSIS — Z794 Long term (current) use of insulin: Secondary | ICD-10-CM | POA: Insufficient documentation

## 2023-01-16 DIAGNOSIS — M47816 Spondylosis without myelopathy or radiculopathy, lumbar region: Secondary | ICD-10-CM | POA: Diagnosis not present

## 2023-01-16 DIAGNOSIS — M5136 Other intervertebral disc degeneration, lumbar region: Secondary | ICD-10-CM | POA: Diagnosis not present

## 2023-01-16 DIAGNOSIS — M549 Dorsalgia, unspecified: Secondary | ICD-10-CM | POA: Diagnosis not present

## 2023-01-16 DIAGNOSIS — I82891 Chronic embolism and thrombosis of other specified veins: Secondary | ICD-10-CM | POA: Insufficient documentation

## 2023-01-16 LAB — COMPREHENSIVE METABOLIC PANEL
ALT: 39 U/L (ref 0–44)
AST: 31 U/L (ref 15–41)
Albumin: 4.4 g/dL (ref 3.5–5.0)
Alkaline Phosphatase: 69 U/L (ref 38–126)
Anion gap: 12 (ref 5–15)
BUN: 21 mg/dL (ref 8–23)
CO2: 28 mmol/L (ref 22–32)
Calcium: 9.7 mg/dL (ref 8.9–10.3)
Chloride: 99 mmol/L (ref 98–111)
Creatinine, Ser: 1.19 mg/dL — ABNORMAL HIGH (ref 0.44–1.00)
GFR, Estimated: 48 mL/min — ABNORMAL LOW (ref >60)
Glucose, Bld: 158 mg/dL — ABNORMAL HIGH (ref 70–99)
Potassium: 3.4 mmol/L — ABNORMAL LOW (ref 3.5–5.1)
Sodium: 139 mmol/L (ref 135–145)
Total Bilirubin: 0.5 mg/dL (ref 0.3–1.2)
Total Protein: 7 g/dL (ref 6.5–8.1)

## 2023-01-16 LAB — CBC
HCT: 37.8 % (ref 36.0–46.0)
Hemoglobin: 12.6 g/dL (ref 12.0–15.0)
MCH: 31.5 pg (ref 26.0–34.0)
MCHC: 33.3 g/dL (ref 30.0–36.0)
MCV: 94.5 fL (ref 80.0–100.0)
Platelets: 205 10*3/uL (ref 150–400)
RBC: 4 MIL/uL (ref 3.87–5.11)
RDW: 16.8 % — ABNORMAL HIGH (ref 11.5–15.5)
WBC: 6.1 10*3/uL (ref 4.0–10.5)
nRBC: 0 % (ref 0.0–0.2)

## 2023-01-16 LAB — URINALYSIS, ROUTINE W REFLEX MICROSCOPIC
Bacteria, UA: NONE SEEN
Bilirubin Urine: NEGATIVE
Glucose, UA: 50 mg/dL — AB
Hgb urine dipstick: NEGATIVE
Ketones, ur: NEGATIVE mg/dL
Nitrite: NEGATIVE
Protein, ur: NEGATIVE mg/dL
Specific Gravity, Urine: 1.012 (ref 1.005–1.030)
pH: 5 (ref 5.0–8.0)

## 2023-01-16 LAB — LIPASE, BLOOD: Lipase: 106 U/L — ABNORMAL HIGH (ref 11–51)

## 2023-01-16 MED ORDER — LIDOCAINE 5 % EX PTCH
1.0000 | MEDICATED_PATCH | CUTANEOUS | Status: DC
  Administered 2023-01-16: 1 via TRANSDERMAL
  Filled 2023-01-16: qty 1

## 2023-01-16 MED ORDER — HYDROCODONE-ACETAMINOPHEN 5-325 MG PO TABS
1.0000 | ORAL_TABLET | Freq: Once | ORAL | Status: AC
  Administered 2023-01-16: 0.5 via ORAL
  Filled 2023-01-16: qty 1

## 2023-01-16 MED ORDER — CYCLOBENZAPRINE HCL 10 MG PO TABS: 10.0000 mg | ORAL_TABLET | Freq: Two times a day (BID) | ORAL | 0 refills | Status: DC | PRN

## 2023-01-16 MED ORDER — IOHEXOL 350 MG/ML SOLN
75.0000 mL | Freq: Once | INTRAVENOUS | Status: AC | PRN
  Administered 2023-01-16: 75 mL via INTRAVENOUS

## 2023-01-16 NOTE — ED Provider Notes (Signed)
[place] [facility] AT [facility] Provider Note   CSN: [id number] Arrival date & time: 01/16/23  6213     History  Chief Complaint  Patient presents with   Abdominal Pain    [name] is a 77 y.o. female.  77 year old female with a history of diabetes and breast cancer in remission who presents to the emergency department with abdominal and back pain.  Patient reports that she was seen in the emergency department recently for back pain that radiates down her legs.  Says that it persisted and she went to urgent care and was given a methylprednisolone Dosepak but she stopped taking it recently due to elevated blood sugars.  Has been taking Tylenol for her back pain but reports that it is persisted so came into the emergency department for evaluation today.  Describes it as lower lumbar and sacral in nature and wrapping around the front of her abdomen.  It is a aching sensation that is worsened with movement and occasionally radiates down her legs.  No back trauma or injuries.  No bowel or bladder incontinence or lower extremity weakness or numbness.  No history of back surgeries.  Not on blood thinners.  No history of IV drug use.  Did have a port that was removed years ago.  No fevers, nausea, vomiting, or diarrhea.       Home Medications Prior to Admission medications   Medication Sig Start Date End Date Taking? Authorizing Provider  cyclobenzaprine (FLEXERIL) 10 MG tablet Take 1 tablet (10 mg total) by mouth 2 (two) times daily as needed for muscle spasms. 01/16/23  Yes Rondel Baton, MD  Accu-Chek Softclix Lancets lancets Check blood sugar up to 7 times per week 07/22/22   Belva Agee, MD  atorvastatin (LIPITOR) 40 MG tablet Take 1 tablet (40 mg total) by mouth daily. 12/29/22 03/29/23  Morrie Sheldon, MD  chlorthalidone (HYGROTON) 25 MG tablet Take 1 tablet (25 mg total) by mouth daily. 12/29/22   Morrie Sheldon, MD  Dulaglutide (TRULICITY)  1.5 MG/0.5ML SOPN Inject 1.5 mg into the skin once a week. 12/29/22   Morrie Sheldon, MD  empagliflozin (JARDIANCE) 10 MG TABS tablet Take 1 tablet (10 mg total) by mouth daily before breakfast. 12/29/22   Morrie Sheldon, MD  fluticasone (FLONASE) 50 MCG/ACT nasal spray Place 1 spray into both nostrils daily. 12/11/21   Atway, Rayann N, DO  glipiZIDE (GLUCOTROL) 5 MG tablet Take 0.5 tablets (2.5 mg total) by mouth 2 (two) times daily. 12/29/22   Morrie Sheldon, MD  glucose blood (ACCU-CHEK GUIDE) test strip Check blood sugar up to 7 times per week 07/22/22   Belva Agee, MD  Insulin Pen Needle 32G X 4 MM MISC Use as directed with victoza 05/27/22   [name], Vasilios, MD  lisinopril (ZESTRIL) 20 MG tablet Take 1 tablet (20 mg total) by mouth daily. 12/29/22   Morrie Sheldon, MD  loratadine (CLARITIN) 10 MG tablet Take 1 tablet (10 mg total) by mouth daily. 09/19/21 12/18/21  Dellis Filbert, MD  methylPREDNISolone (MEDROL DOSEPAK) 4 MG TBPK tablet Take as directed 01/02/23   Jannifer Franklin, MD  polyethylene glycol (MIRALAX) 17 g packet Take 17 g by mouth daily. 09/05/21   Dellis Filbert, MD      Allergies    Hydrochlorothiazide    Review of Systems   Review of Systems  Physical Exam Updated Vital Signs BP (!) 110/58 (BP Location: Left Arm)   Pulse 78   Temp 98  F (36.7 C) (Oral)   Resp 18   SpO2 100%  Physical Exam Vitals and nursing note reviewed.  Constitutional:      General: She is not in acute distress.    Appearance: She is well-developed.  HENT:     Head: Normocephalic and atraumatic.     Right Ear: External ear normal.     Left Ear: External ear normal.     Nose: Nose normal.  Eyes:     Extraocular Movements: Extraocular movements intact.     Conjunctiva/sclera: Conjunctivae normal.     Pupils: Pupils are equal, round, and reactive to light.  Pulmonary:     Effort: Pulmonary effort is normal. No respiratory distress.  Abdominal:     General: Abdomen is flat. There  is no distension.     Palpations: Abdomen is soft. There is no mass.     Tenderness: There is no abdominal tenderness. There is no guarding.  Musculoskeletal:     Cervical back: Normal range of motion and neck supple.     Right lower leg: No edema.     Left lower leg: No edema.     Comments: No spinal midline TTP in cervical, thoracic, or lumbar spine. No stepoffs noted.   Motor: Muscle bulk and tone are normal. Strength is 5/5 in hip flexion, knee flexion and extension, ankle dorsiflexion and plantar flexion bilaterally. Full strength of great toe dorsiflexion bilaterally.  Sensory: Intact sensation to light touch in L2 though S1 dermatomes bilaterally.   Skin:    General: Skin is warm and dry.  Neurological:     Mental Status: She is alert and oriented to person, place, and time. Mental status is at baseline.  Psychiatric:        Mood and Affect: Mood normal.     ED Results / Procedures / Treatments   Labs (all labs ordered are listed, but only abnormal results are displayed) Labs Reviewed  LIPASE, BLOOD - Abnormal; Notable for the following components:      Result Value   Lipase 106 (*)    All other components within normal limits  COMPREHENSIVE METABOLIC PANEL - Abnormal; Notable for the following components:   Potassium 3.4 (*)    Glucose, Bld 158 (*)    Creatinine, Ser 1.19 (*)    GFR, Estimated 48 (*)    All other components within normal limits  CBC - Abnormal; Notable for the following components:   RDW 16.8 (*)    All other components within normal limits  URINALYSIS, ROUTINE W REFLEX MICROSCOPIC - Abnormal; Notable for the following components:   Color, Urine STRAW (*)    Glucose, UA 50 (*)    Leukocytes,Ua MODERATE (*)    All other components within normal limits    EKG None  Radiology CT ABDOMEN PELVIS W CONTRAST  Result Date: 01/16/2023 CLINICAL DATA:  Abdominal and back pain.  Elevated lipase. EXAM: CT ABDOMEN AND PELVIS WITH CONTRAST TECHNIQUE:  Multidetector CT imaging of the abdomen and pelvis was performed using the standard protocol following bolus administration of intravenous contrast. RADIATION DOSE REDUCTION: This exam was performed according to the departmental dose-optimization program which includes automated exposure control, adjustment of the mA and/or kV according to patient size and/or use of iterative reconstruction technique. CONTRAST:  75mL OMNIPAQUE IOHEXOL 350 MG/ML SOLN COMPARISON:  CT abdomen/pelvis 02/26/2022 FINDINGS: Lower chest: The lung bases are clear. The imaged heart is unremarkable. Hepatobiliary: The liver and gallbladder are unremarkable. There is no biliary  ductal dilatation. Pancreas: Unremarkable. There is no peripancreatic inflammatory change. Spleen: Unremarkable. Adrenals/Urinary Tract: The adrenals are unremarkable. A few subcentimeter lesions in the kidneys are too small to characterize but likely reflect benign cysts requiring no specific imaging follow-up. There are no suspicious parenchymal lesions. There are no stones in either kidney or along the course of either ureter. There is symmetric excretion of contrast into the collecting systems on the delayed images. The bladder is unremarkable. Stomach/Bowel: The stomach is unremarkable. There is no evidence of bowel obstruction. There is no abnormal bowel wall thickening or inflammatory change. There is colonic diverticulosis without evidence of acute diverticulitis. The appendix is normal. Vascular/Lymphatic: There is mild calcified plaque in the nonaneurysmal abdominal aorta. The major branch vessels are patent. The main portal and splenic veins are patent. Thrombus is again noted in the right ovarian vein, unchanged (3-38). There is no surrounding inflammatory change. There is no abdominopelvic lymphadenopathy. Reproductive: The uterus is surgically absent. There is no adnexal mass. Other: There is no ascites or free air. Musculoskeletal: There is no acute  osseous abnormality or suspicious osseous lesion there is multilevel degenerative change throughout the lumbar spine with advanced disc space narrowing and vacuum disc phenomenon at L4-L5. There are flowing anterior osteophytes in the lower thoracic spine consistent with diffuse idiopathic skeletal hyperostosis. IMPRESSION: 1. No CT evidence of acute pancreatitis or other acute finding in the abdomen or pelvis. 2. Colonic diverticulosis without evidence of acute diverticulitis. 3. 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 No surrounding inflammatory change. Electronically Signed   By: Lesia Hausen M.D.   On: 01/16/2023 13:42   DG Lumbar Spine Complete  Result Date: 01/16/2023 CLINICAL DATA:  back pain EXAM: LUMBAR SPINE - COMPLETE 4 VIEW COMPARISON:  None Available. FINDINGS: There is lumbarization of S1 with the last well-formed disc space labeled S1-S2. Grade 1 anterolisthesis of L5 on S1. Vertebral body heights are preserved. Moderate disc space loss at L3-L4. there are mild-to-moderate degenerative facet changes in the lower lumbar spine. No pars defect. Assessment of the sacrum is slightly limited due to overlying bowel gas. Symmetric SI joints. IMPRESSION: 1. No acute osseous abnormality 2. Moderate degenerative disc disease at L3-L4. 3. Mild-to-moderate degenerative facet changes in the lower lumbar spine. Electronically Signed   By: Lorenza Cambridge M.D.   On: 01/16/2023 08:26    Procedures Procedures    Medications Ordered in ED Medications  lidocaine (LIDODERM) 5 % 1 patch (1 patch Transdermal Patch Applied 01/16/23 0813)  HYDROcodone-acetaminophen (NORCO/VICODIN) 5-325 MG per tablet 1 tablet (0.5 tablets Oral Given 01/16/23 0812)  iohexol (OMNIPAQUE) 350 MG/ML injection 75 mL (75 mLs Intravenous Contrast Given 01/16/23 1146)    ED Course/ Medical Decision Making/ A&P                                  Medical Decision Making Amount and/or Complexity of Data  Reviewed Labs: ordered. Radiology: ordered.  Risk Prescription drug management.   [name] is a 77 y.o. female with comorbidities that complicate the patient evaluation including with a history of diabetes and breast cancer in remission who presents to the emergency department with abdominal and back pain  Initial Ddx:  Lumbar radiculopathy, spinal cord compression, pathologic fracture, intra-abdominal infection  MDM/Course:  Patient presents emergency department with abdominal and back pain.  Reports that her pain radiates down her legs.  No signs of  spinal cord compression at this time.  Did have x-rays in the emergency department which did not show any sign of pathologic fracture.  Did have lab work that was drawn that shows a persistently elevated lipase.  Has not had any abdominal imaging recently given her pain did obtain a CT of the abdomen pelvis at this time which did not show any evidence of mass or other acute abnormality.  Do suspect that her pain is due to lumbar radiculopathy and will have her follow-up with the spine surgeons as an outpatient.  Also was informed of her ovarian vein thrombus which could potentially be related to some of the right lower quadrant pain that she has been having.  Will have her follow-up with OB/GYN about this.  Was given a muscle relaxer and told not to take the methylprednisolone because of her elevated blood sugars.  This patient presents to the ED for concern of complaints listed in HPI, this involves an extensive number of treatment options, and is a complaint that carries with it a high risk of complications and morbidity. Disposition including potential need for admission considered.   Dispo: DC Home. Return precautions discussed including, but not limited to, those listed in the AVS. Allowed pt time to ask questions which were answered fully prior to dc.  Records reviewed Outpatient Clinic Notes The following labs were independently  interpreted: Chemistry and show CKD I independently reviewed the following imaging with scope of interpretation limited to determining acute life threatening conditions related to emergency care: CT Abdomen/Pelvis and agree with the radiologist interpretation with the following exceptions: none I personally reviewed and interpreted cardiac monitoring: normal sinus rhythm  I personally reviewed and interpreted the pt's EKG: see above for interpretation  I have reviewed the patients home medications and made adjustments as needed Social Determinants of health:  Elderly         Final Clinical Impression(s) / ED Diagnoses Final diagnoses:  Acute bilateral low back pain with bilateral sciatica  Chronic thrombosis of ovarian vein    Rx / DC Orders ED Discharge Orders          Ordered    cyclobenzaprine (FLEXERIL) 10 MG tablet  2 times daily PRN        01/16/23 1436              Rondel Baton, MD 01/16/23 1739

## 2023-01-16 NOTE — Discharge Instructions (Addendum)
You were seen for your back pain in the emergency department.   At home, please use over-the-counter Tylenol and lidocaine patches.  You may also use the cyclobenzaprine we have prescribed you as needed for pain.  Do not take the cyclobenzaprine before driving or operating heavy machinery.  Follow-up with your primary doctor in 2-3 days regarding your visit.  Follow-up with the spine clinic as soon as possible regarding your symptoms.  Please follow-up with OB/GYN about the blood clot in your ovarian vein.  Return immediately to the emergency department if you experience any of the following: Numbness or weakness of your legs, bowel or bladder incontinence, numbness while wiping after pooping or urinating, or any other concerning symptoms.    Thank you for visiting our Emergency Department. It was a pleasure taking care of you today.

## 2023-01-16 NOTE — ED Triage Notes (Signed)
Patient reports pain across abdomen and lower back pain for several weeks worse this morning , denies emesis or diarrhea , no fever or chills .

## 2023-01-19 ENCOUNTER — Encounter: Payer: Self-pay | Admitting: Student

## 2023-01-19 ENCOUNTER — Ambulatory Visit: Payer: Medicare Other | Admitting: Student

## 2023-01-19 ENCOUNTER — Other Ambulatory Visit (HOSPITAL_COMMUNITY)
Admission: RE | Admit: 2023-01-19 | Discharge: 2023-01-19 | Disposition: A | Discharge status: Home / Self Care | Payer: Medicare Other | Source: Ambulatory Visit | Attending: Internal Medicine | Admitting: Internal Medicine

## 2023-01-19 VITALS — BP 126/63 | HR 100 | Temp 97.6°F | Ht 59.0 in | Wt 151.1 lb

## 2023-01-19 DIAGNOSIS — N898 Other specified noninflammatory disorders of vagina: Secondary | ICD-10-CM | POA: Diagnosis not present

## 2023-01-19 DIAGNOSIS — M545 Low back pain, unspecified: Secondary | ICD-10-CM | POA: Diagnosis not present

## 2023-01-19 DIAGNOSIS — I82890 Acute embolism and thrombosis of other specified veins: Secondary | ICD-10-CM

## 2023-01-19 NOTE — Assessment & Plan Note (Signed)
Patient with history of lowe back pain and sciatica who presented to ED twice, once treated with medrol dose pack, and second time after incomplete resolution of issue after suspending therapy 2/2 hyperglycemia. The last ED visit, patient was evaluated with lumbar XR and CT A/P significant for moderate degenerative disc disease at L3-L4 and mild-to-moderate degenerative facet changes in the lower lumbar spine. He breast cancer has been in remission, last mammogram 06/2022. No saddle anesthesia, compression, B symptoms. Patient reports that the back pain has been more present after starting the [facility] exercise program, but that after the last ED visit, she has done much better with almost complete resolution of it.  Today patient is at her baseline and reports no pain. Her MSK and neuro exams show know L sided radiculopathy with known DJD diseases.  -Continue stretching exercises after active exercise programing -Advised her to take muscle relaxant only when needed and to avoid when she anticipates needing to drive -No other changes at this time

## 2023-01-19 NOTE — Progress Notes (Signed)
Subjective:  CC: ED visit follow up  HPI:  [name] is a 77 y.o. female with a past medical history stated below and presents today for ED visit follow up. Please see problem based assessment and plan for additional details.  Past Medical History:  Diagnosis Date   Allergy    Anemia    Breast CA (HCC)    (Rt) breast ca dx 4/12---  S/P RADICAL MASTECTOMY AND CHEMORADIATION   Diabetes mellitus ORAL MED   Fatty liver disease, nonalcoholic    History of hyperkalemia    Hx antineoplastic chemo  10/12 - 06/24/11   PACLITAX EL COMPLETED 06/24/11   Hyperlipidemia    Hypertension    Mild nonproliferative diabetic retinopathy of right eye (HCC) 03/19/2011   Dr. Dede Query Poudyal   Numbness and tingling    Hx: of in fingers and toes since chemotherapy   OA (osteoarthritis) of knee    RIGHT LEG   Obesity    Personal history of chemotherapy    Personal history of radiation therapy    Renal lesion     Current Outpatient Medications on File Prior to Visit  Medication Sig Dispense Refill   Accu-Chek Softclix Lancets lancets Check blood sugar up to 7 times per week 100 each 3   atorvastatin (LIPITOR) 40 MG tablet Take 1 tablet (40 mg total) by mouth daily. 90 tablet 1   chlorthalidone (HYGROTON) 25 MG tablet Take 1 tablet (25 mg total) by mouth daily. 90 tablet 1   cyclobenzaprine (FLEXERIL) 10 MG tablet Take 1 tablet (10 mg total) by mouth 2 (two) times daily as needed for muscle spasms. 20 tablet 0   Dulaglutide (TRULICITY) 1.5 MG/0.5ML SOPN Inject 1.5 mg into the skin once a week. 4 mL 2   empagliflozin (JARDIANCE) 10 MG TABS tablet Take 1 tablet (10 mg total) by mouth daily before breakfast. 90 tablet 2   fluticasone (FLONASE) 50 MCG/ACT nasal spray Place 1 spray into both nostrils daily. 18.2 mL 2   glipiZIDE (GLUCOTROL) 5 MG tablet Take 0.5 tablets (2.5 mg total) by mouth 2 (two) times daily. 90 tablet 2   glucose blood (ACCU-CHEK GUIDE) test strip Check blood sugar up to  7 times per week 100 each 3   Insulin Pen Needle 32G X 4 MM MISC Use as directed with victoza 100 each 3   lisinopril (ZESTRIL) 20 MG tablet Take 1 tablet (20 mg total) by mouth daily. 90 tablet 2   loratadine (CLARITIN) 10 MG tablet Take 1 tablet (10 mg total) by mouth daily. 90 tablet 0   methylPREDNISolone (MEDROL DOSEPAK) 4 MG TBPK tablet Take as directed 1 each 0   polyethylene glycol (MIRALAX) 17 g packet Take 17 g by mouth daily. 14 each 0   No current facility-administered medications on file prior to visit.    Family History  Problem Relation Age of Onset   Stroke Father    Breast cancer Sister    Cancer Sister        breast   Cancer Sister        COLON CANCER   Stroke Sister    Heart disease Brother     Social History   Socioeconomic History   Marital status: Married    Spouse name: Not on file   Number of children: 4   Years of education: 16   Highest education level: Not on file  Occupational History   Not on file  Tobacco Use  Smoking status: Never   Smokeless tobacco: Never  Vaping Use   Vaping status: Never Used  Substance and Sexual Activity   Alcohol use: No    Alcohol/week: 0.0 standard drinks of alcohol   Drug use: No    Types: Flunitrazepam   Sexual activity: Not on file    Comment: MENARCHE AGE 67, G4, P4, PARITY AGE 63, HRT 20+ YEARS    Other Topics Concern   Not on file  Social History Narrative   Current Social History 01/14/2021        Patient lives with spouse in a home is 2 stories. There are 2 steps up to the entrance the patient uses with railings.      Patient's method of transportation is personal car.      The highest level of education was high school diploma.      The patient currently retired.      Identified important Relationships are "my family"      Pets : 1       Interests / Fun: "[employer]"       Current Stressors: "bills"      Religious / Personal Beliefs: Baptist       Social Determinants of Health    Financial Resource Strain: Low Risk  (05/28/2022)   Overall Financial Resource Strain (CARDIA)    Difficulty of Paying Living Expenses: Not very hard  Food Insecurity: No Food Insecurity (05/28/2022)   Hunger Vital Sign    Worried About Running Out of Food in the Last Year: Never true    Ran Out of Food in the Last Year: Never true  Transportation Needs: No Transportation Needs (05/28/2022)   PRAPARE - Administrator, Civil Service (Medical): No    Lack of Transportation (Non-Medical): No  Physical Activity: Insufficiently Active (05/28/2022)   Exercise Vital Sign    Days of Exercise per Week: 1 day    Minutes of Exercise per Session: 30 min  Stress: No Stress Concern Present (05/28/2022)   [employer] of Occupational Health - Occupational Stress Questionnaire    Feeling of Stress : Not at all  Social Connections: Socially Integrated (05/28/2022)   Social Connection and Isolation Panel [NHANES]    Frequency of Communication with Friends and Family: Twice a week    Frequency of Social Gatherings with Friends and Family: Twice a week    Attends Religious Services: More than 4 times per year    Active Member of [employer] or Organizations: Yes    Attends Engineer, structural: More than 4 times per year    Marital Status: Married  Catering manager Violence: Not At Risk (05/28/2022)   Humiliation, Afraid, Rape, and Kick questionnaire    Fear of Current or Ex-Partner: No    Emotionally Abused: No    Physically Abused: No    Sexually Abused: No    Review of Systems: ROS negative except for what is noted on the assessment and plan.  Objective:   Vitals:   01/19/23 1334  BP: 126/63  Pulse: 100  Temp: 97.6 F (36.4 C)  TempSrc: Oral  SpO2: 100%  Weight: 151 lb 1.6 oz (68.5 kg)  Height: 4\' 11"  (1.499 m)    Physical Exam: Constitutional: well-appearing elderly woman in NAD HENT:mucous membranes moist Eyes: conjunctiva non-erythematous Neck:  supple Cardiovascular: regular rate and rhythm, no m/r/g Pulmonary/Chest: normal work of breathing on room air, lungs clear to auscultation bilaterally Abdominal: soft, non-tender, non-distended MSK: normal bulk  and tone, no paraspinal tenderness, normal gait Neurological: alert & oriented x 3, 5/5 strength in bilateral upper and lower extremities, + SLR with reproduction of symptoms of the L, none on R Skin: warm and dry Psych: Pleasant mood and affect       12/29/2022    3:09 PM  Depression screen PHQ 2/9  Decreased Interest 0  Down, Depressed, Hopeless 0  PHQ - 2 Score 0  Altered sleeping 0  Tired, decreased energy 0  Change in appetite 0  Feeling bad or failure about yourself  0  Trouble concentrating 0  Moving slowly or fidgety/restless 0  Suicidal thoughts 0  PHQ-9 Score 0  Difficult doing work/chores Not difficult at all        No data to display           Assessment & Plan:   Vaginal discharge Patient with increased white vaginal discharge, at times malodorous. No pruritus or increased sensitivity. No dysuria, polyuria, or lower urinary tract symptoms. This has been going on for the past 1.5 weeks. She is currently on SGLT2i and wipes well, however, did notice increased in discharge after was put on Medrol dose pack for lower back pain and blood sugars were worse.  -Follow up cervicovaginal swab to guide treatment -No indication for U/A at this time as patient is not symptomatic  Low back pain Patient with history of lowe back pain and sciatica who presented to ED twice, once treated with medrol dose pack, and second time after incomplete resolution of issue after suspending therapy 2/2 hyperglycemia. The last ED visit, patient was evaluated with lumbar XR and CT A/P significant for moderate degenerative disc disease at L3-L4 and mild-to-moderate degenerative facet changes in the lower lumbar spine. He breast cancer has been in remission, last mammogram 06/2022. No  saddle anesthesia, compression, B symptoms. Patient reports that the back pain has been more present after starting the [facility] exercise program, but that after the last ED visit, she has done much better with almost complete resolution of it.  Today patient is at her baseline and reports no pain. Her MSK and neuro exams show know L sided radiculopathy with known DJD diseases.  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 No pain or abnormal abdominal exam today. Continue to monitor at this time   Follow up in 3 months for DM management  Patient discussed with Dr. Alfonse Alpers, MD [facility] Internal Medicine Program - PGY-2 01/19/2023, 4:58 PMe.

## 2023-01-19 NOTE — Assessment & Plan Note (Signed)
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 No pain or abnormal abdominal exam today. Continue to monitor at this time

## 2023-01-19 NOTE — Patient Instructions (Addendum)
Thank you, [name] for allowing [place] to provide your care today. Today we discussed  Your recent ED visit for lower back pain: I am glad you are feeling better! Please continue exercising and stretching after your sessions. I want you to be able to continue being active. Only use the muscle relaxant when necessary. Don't do it when you need to drive.  For the the clot in the right ovarian vein  - stable from before. Because it is not giving trouble at this time, we are going to monitor it for now. If you have sustained right lower pelvis pain or new symptoms, we would want to know.  For the vaginal discharge - we are checking for yeast infections that can happen in people with high blood sugars like you had recently. If positive, I will call a medication for it. It will not be necessary to stop the Jardiance unless you have recurrent infections.   I will call if any are abnormal. All of your labs can be accessed through "My Chart".   My Chart Access: [email or web address]  Please follow-up in: 3 months with Dr. Welton Flakes (already scheduled)    We look forward to seeing you next time. Please call our clinic at [phone] if you have any questions or concerns. The best time to call is Monday-Friday from 9am-4pm, but there is someone available 24/7. If after hours or the weekend, call the main hospital number and ask for the Internal Medicine Resident On-Call. If you need medication refills, please notify your pharmacy one week in advance and they will send [place] a request.   Thank you for letting us take part in your care. Wishing you the best!  Morene Crocker, MD 01/19/2023, 2:03 PM [facility] Internal Medicine Resident, PGY-2

## 2023-01-19 NOTE — Assessment & Plan Note (Signed)
Patient with increased white vaginal discharge, at times malodorous. No pruritus or increased sensitivity. No dysuria, polyuria, or lower urinary tract symptoms. This has been going on for the past 1.5 weeks. She is currently on SGLT2i and wipes well, however, did notice increased in discharge after was put on Medrol dose pack for lower back pain and blood sugars were worse.  -Follow up cervicovaginal swab to guide treatment -No indication for U/A at this time as patient is not symptomatic

## 2023-01-20 ENCOUNTER — Encounter: Payer: Self-pay | Admitting: *Deleted

## 2023-01-21 NOTE — Progress Notes (Signed)
[facility] PREP Weekly Session  Patient Details  Name: RAMYAH WITCHER MRN: [id number] Date of Birth: [birth year] Age: 77 y.o. PCP: Morrie Sheldon, MD  Vitals:   01/20/23 1330  Weight: 151 lb (68.5 kg)     [facility] Weekly seesion - 01/20/23 1500       [facility] "PREP" Location   [facility] "PREP" Location [facility]      Weekly Session   Topic Discussed Other   Portion Control. Healthy eating plate, stratagies for controlling portion size, visual aids.   Classes attended to date 67             Remo Lipps 01/21/2023, 11:27 AM

## 2023-01-22 NOTE — Progress Notes (Signed)
Patient called and aware. She will return to clinic if symptoms persist for speculum exam and further investigation.

## 2023-01-28 ENCOUNTER — Other Ambulatory Visit: Payer: Self-pay | Admitting: Neurosurgery

## 2023-01-28 DIAGNOSIS — M5416 Radiculopathy, lumbar region: Secondary | ICD-10-CM | POA: Diagnosis not present

## 2023-01-28 DIAGNOSIS — M47816 Spondylosis without myelopathy or radiculopathy, lumbar region: Secondary | ICD-10-CM

## 2023-01-28 NOTE — Progress Notes (Signed)
Internal Medicine Clinic Attending  Case discussed with the resident at the time of the visit.  We reviewed the resident's history and exam and pertinent patient test results.  I agree with the assessment, diagnosis, and plan of care documented in the resident's note.  Debe Coder, MD

## 2023-01-28 NOTE — Addendum Note (Signed)
Addended by: Debe Coder B on: 01/28/2023 11:22 AM   Modules accepted: Level of Service

## 2023-01-31 ENCOUNTER — Ambulatory Visit
Admission: RE | Admit: 2023-01-31 | Discharge: 2023-01-31 | Disposition: A | Discharge status: Home / Self Care | Payer: Medicare Other | Source: Ambulatory Visit | Attending: Neurosurgery | Admitting: Neurosurgery

## 2023-01-31 DIAGNOSIS — M47816 Spondylosis without myelopathy or radiculopathy, lumbar region: Secondary | ICD-10-CM

## 2023-01-31 DIAGNOSIS — M48061 Spinal stenosis, lumbar region without neurogenic claudication: Secondary | ICD-10-CM | POA: Diagnosis not present

## 2023-02-03 ENCOUNTER — Encounter: Payer: Self-pay | Admitting: *Deleted

## 2023-02-03 NOTE — Progress Notes (Signed)
[facility] PREP Weekly Session  Patient Details  Name: [name] MRN: [id number] Date of Birth: 09/19/1945 Age: 77 y.o. PCP: Morrie Sheldon, MD  Vitals:   02/03/23 1330  Weight: 148 lb (67.1 kg)     [facility] Weekly seesion - 02/03/23 1500       [facility] "PREP" Location   [facility] "PREP" Location [facility]      Weekly Session   Topic Discussed Calorie breakdown   Review macronutrients, carbohydrates, fats and proteins. Quality nutrition and cooking at home. All calories are not the same. Protein rich foods.   Minutes exercised this week 75 minutes    Classes attended to date 74             Remo Lipps 02/03/2023, 5:43 PM

## 2023-02-10 ENCOUNTER — Encounter: Payer: Self-pay | Admitting: *Deleted

## 2023-02-10 NOTE — Progress Notes (Signed)
[facility] PREP Weekly Session  Patient Details  Name: [name] MRN: [id number] Date of Birth: December 15, 1945 Age: 77 y.o. PCP: Morrie Sheldon, MD  Vitals:   02/10/23 1330  Weight: 149 lb (67.6 kg)     [facility] Weekly seesion - 02/10/23 1500       [facility] "PREP" Location   [facility] "PREP" Location [facility]      Weekly Session   Topic Discussed Hitting roadblocks   Review metrics. Discussed common obstacles to success and stratagies for moving forward. [facility] membership talk by [employer] conservator/restorer [place]).   Classes attended to date 40             Remo Lipps 02/10/2023, 6:13 PM

## 2023-02-17 ENCOUNTER — Telehealth: Payer: Self-pay

## 2023-02-17 NOTE — Telephone Encounter (Signed)
Transition Care Management Follow-up Telephone Call Date of discharge and from where: 01/16/2023 [facility] How have you been since you were released from the hospital? Patient is able to walk better, but still has some pain and stiffness.  She is taking Tylenol for pain. Any questions or concerns? No  Items Reviewed: Did the pt receive and understand the discharge instructions provided? Yes  Medications obtained and verified? Yes  Other? No  Any new allergies since your discharge? No  Dietary orders reviewed? Yes Do you have support at home? Yes   Follow up appointments reviewed:  PCP Hospital f/u appt confirmed? Yes  Scheduled to see Caraballo M. Lily Kocher, MD on 01/19/2023 @ [facility]. Specialist Hospital f/u appt confirmed? No  Scheduled to see  on  @ . Are transportation arrangements needed? No  If their condition worsens, is the pt aware to call PCP or go to the Emergency Dept.? Yes Was the patient provided with contact information for the PCP's office or ED? Yes Was to pt encouraged to call back with questions or concerns? Yes   [name] [facility]  [facility], [place] [facility] Guide Direct Dial: [phone]  Website: [email or web address]

## 2023-02-19 ENCOUNTER — Encounter: Payer: Self-pay | Admitting: *Deleted

## 2023-02-19 NOTE — Progress Notes (Signed)
[facility] PREP Evaluation  Patient Details  Name: [name] MRN: [id number] Date of Birth: Dec 22, 1945 Age: 77 y.o. PCP: Morrie Sheldon, MD  Vitals:   02/19/23 1200  BP: 110/64  Pulse: 88  Resp: 20  SpO2: 98%  Weight: 146 lb 3.2 oz (66.3 kg)     [facility] Eval - 02/19/23 1215       [facility] "PREP" Location   [facility] "PREP" Location [facility]      Referral    Referring Provider Hoffman    Reason for referral Obesitity/Overweight;Inactivity;Hypertension;Diabetes    Program Start Date 12/02/22    Program End Date 02/19/23      Measurement   Waist Circumference 39.25 inches    Waist Circumference End Program 38.7 inches    Hip Circumference 42.5 inches    Hip Circumference End Program 40.5 inches    Body fat 38.7 percent      Mobility and Daily Activities   I find it easy to walk up or down two or more flights of stairs. 4    I have no trouble taking out the trash. 4    I do housework such as vacuuming and dusting on my own without difficulty. 3    I can easily lift a gallon of milk (8lbs). 4    I can easily walk a mile. 1    I have no trouble reaching into high cupboards or reaching down to pick up something from the floor. 3    I do not have trouble doing out-door work such as Loss adjuster, chartered, raking leaves, or gardening. 3      Mobility and Daily Activities   I feel younger than my age. 3    I feel independent. 4    I feel energetic. 3    I live an active life.  3    I feel strong. 3    I feel healthy. 3    I feel active as other people my age. 3      How fit and strong are you.   Fit and Strong Total Score 44            Past Medical History:  Diagnosis Date   Allergy    Anemia    Breast CA (HCC)    (Rt) breast ca dx 4/12---  S/P RADICAL MASTECTOMY AND CHEMORADIATION   Diabetes mellitus ORAL MED   Fatty liver disease, nonalcoholic    History of hyperkalemia    Hx antineoplastic chemo  10/12 - 06/24/11   PACLITAX EL COMPLETED 06/24/11    Hyperlipidemia    Hypertension    Mild nonproliferative diabetic retinopathy of right eye (HCC) 03/19/2011   Dr. Dede Query Poudyal   Numbness and tingling    Hx: of in fingers and toes since chemotherapy   OA (osteoarthritis) of knee    RIGHT LEG   Obesity    Personal history of chemotherapy    Personal history of radiation therapy    Renal lesion    Past Surgical History:  Procedure Laterality Date   COLONOSCOPY W/ POLYPECTOMY     Hx; of   I & D KNEE WITH POLY EXCHANGE Right 09/03/2012   Procedure: IRRIGATION AND DEBRIDEMENT RIGHT KNEE WITH POLY EXCHANGE;  Surgeon: [name], MD;  Location: [facility] OR;  Service: Orthopedics;  Laterality: Right;   JOINT REPLACEMENT     knee arthroscopy  10/28/1999   RIGHT   Laparoscopy with laparoscopic right salpingo oophorectomy and lysis of  pelvic and abdominal adhesions  07/2011   Dr. Jennette Kettle    LEFT TOTAL KNEE ARTHROPLASTY (Left Knee)  07/30/2020   MASTECTOMY MODIFIED RADICAL Left 12/04/2010   W/ LEFT PAC PLACEMENT (RIGHT BREAST W/ AXILLARY CONTENTS/ NODE BX'S   PORT-A-CATH REMOVAL  09/24/2011   Procedure: REMOVAL PORT-A-CATH;  Surgeon: Clovis Pu. Cornett, MD;  Location: [facility] ORS;  Service: General;  Laterality: N/A;   PORTACATH PLACEMENT     REPLACED PAC DUE TO MALFUNCTION (LEFT)   TOTAL KNEE ARTHROPLASTY Right 08/16/2012   Procedure: TOTAL KNEE ARTHROPLASTY;  Surgeon: [name], MD;  Location: [facility] OR;  Service: Orthopedics;  Laterality: Right;   TOTAL KNEE ARTHROPLASTY Left 07/30/2020   Procedure: LEFT TOTAL KNEE ARTHROPLASTY;  Surgeon: Tarry Kos, MD;  Location: [facility] OR;  Service: Orthopedics;  Laterality: Left;   TRANSTHORACIC ECHOCARDIOGRAM  10/29/2010   NORMAL LVF/ EF 55-60%/ MILD MV REGURG   TUBAL LIGATION  YRS AGO   VAGINAL HYSTERECTOMY  AGE 23   W/ LSO   Social History   Tobacco Use  Smoking Status Never  Smokeless Tobacco Never    Remo Lipps 02/19/2023, 9:21 PM    PREP Complete. Attended 10 of 12 education classes  and 7 of 10 exercise sessions. [name] was a very engaged participant and contributed greatly to the positive camaraderie of the group. Improvement noted on FIT testing in all areas tested. Fit and Strong survey up by 4 points. Weight loss 4.2 lbs. Hip/waist ratio decrease 1.25"/2" in 12 weeks. [name] has an intentional plan for continuing to stay active. She has made some dietary changes but states this continues to be challenging. Keep up the good work [name]!

## 2023-03-04 DIAGNOSIS — M47816 Spondylosis without myelopathy or radiculopathy, lumbar region: Secondary | ICD-10-CM | POA: Diagnosis not present

## 2023-03-04 DIAGNOSIS — M5416 Radiculopathy, lumbar region: Secondary | ICD-10-CM | POA: Diagnosis not present

## 2023-03-13 DIAGNOSIS — M5451 Vertebrogenic low back pain: Secondary | ICD-10-CM | POA: Diagnosis not present

## 2023-03-16 ENCOUNTER — Other Ambulatory Visit (HOSPITAL_COMMUNITY): Payer: Self-pay

## 2023-03-18 ENCOUNTER — Ambulatory Visit (HOSPITAL_COMMUNITY)
Admission: EM | Admit: 2023-03-18 | Discharge: 2023-03-18 | Disposition: A | Discharge status: Home / Self Care | Payer: Medicare Other | Attending: Sports Medicine | Admitting: Sports Medicine

## 2023-03-18 ENCOUNTER — Encounter (HOSPITAL_COMMUNITY): Payer: Self-pay

## 2023-03-18 DIAGNOSIS — M546 Pain in thoracic spine: Secondary | ICD-10-CM

## 2023-03-18 DIAGNOSIS — M5451 Vertebrogenic low back pain: Secondary | ICD-10-CM | POA: Diagnosis not present

## 2023-03-18 NOTE — ED Triage Notes (Signed)
Patient reports chronic sciatica pain and reports that she is scheduled for PT today and an injection on 03/30/23. Patient states 3 days ago she began having mid back pain as well and radiates into her bilateral chest wall.  Patient states she takes Tylenol ES for her pain.

## 2023-03-18 NOTE — Discharge Instructions (Addendum)
Recommend you continue the Tylenol, but increase to 1000mg  (two extra strength tablets) every 8 hours as needed for pain.  I also recommend using topical Voltaren (diclofenac) or icy hot which can be picked up over the counter at your pharmacy.   Follow-up with your neurosurgery office as needed for this issue, especially if failing to improve with physical therapy.

## 2023-03-18 NOTE — ED Provider Notes (Signed)
[facility]    CSN: [id number] Arrival date & time: 03/18/23  4098      History   Chief Complaint No chief complaint on file.   HPI [name] is a 77 y.o. female.   HPI She presents with 1 weeks of bilateral thoracic back dull achy pain that wraps around her chest wall anteriorly. She is curious if this is related to her sciatica. She has known lumbar spondylosis w/ DDD followed by [facility]. She is currently in PT for this, though has only met with them once and didn't have this issue at the time. Regarding her presenting complaint she rates the discomfort between 2-5/10. Denies positional changes aggravating or alleviating it. She has tried tylenol with only mild improvement, no other therapies for it yet. Denies chest pain/pressure, shortness of breath, N/V, coughing, syncope/presyncope. No rashes.   Past Medical History:  Diagnosis Date   Allergy    Anemia    Breast CA (HCC)    (Rt) breast ca dx 4/12---  S/P RADICAL MASTECTOMY AND CHEMORADIATION   Diabetes mellitus ORAL MED   Fatty liver disease, nonalcoholic    History of hyperkalemia    Hx antineoplastic chemo  10/12 - 06/24/11   PACLITAX EL COMPLETED 06/24/11   Hyperlipidemia    Hypertension    Mild nonproliferative diabetic retinopathy of right eye (HCC) 03/19/2011   Dr. Dede Query Poudyal   Numbness and tingling    Hx: of in fingers and toes since chemotherapy   OA (osteoarthritis) of knee    RIGHT LEG   Obesity    Personal history of chemotherapy    Personal history of radiation therapy    Renal lesion     Patient Active Problem List   Diagnosis Date Noted   Thrombosis of ovarian vein 01/19/2023   CKD stage 3a, GFR 45-59 ml/min (HCC) 08/27/2022   Hypokalemia 08/26/2022   Nausea 09/24/2020   Status post total left knee replacement 07/30/2020   Fatigue 02/10/2019   Primary osteoarthritis of left knee 01/25/2019   Cervical neuropathy 08/20/2018   Bilateral carpal tunnel  syndrome 06/29/2018   Depressed mood 01/21/2018   Need for immunization against influenza 01/17/2018   Low back pain 12/20/2014   Osteopenia 12/13/2013   Vaginal discharge 12/13/2013   Peripheral neuropathy 11/02/2013   Health care maintenance 11/17/2012   Arthritis of knee, right 08/16/2012   Breast cancer, right breast (HCC) 09/25/2010   History of colonic polyps 07/19/2007   OBESITY NOS 07/09/2006   Type 2 diabetes mellitus with renal manifestations (HCC) 03/10/2006   Hyperlipidemia 03/10/2006   Hypertension associated with diabetes (HCC) 03/10/2006   Fatty liver 03/10/2006    Past Surgical History:  Procedure Laterality Date   COLONOSCOPY W/ POLYPECTOMY     Hx; of   I & D KNEE WITH POLY EXCHANGE Right 09/03/2012   Procedure: IRRIGATION AND DEBRIDEMENT RIGHT KNEE WITH POLY EXCHANGE;  Surgeon: [name], MD;  Location: [facility];  Service: Orthopedics;  Laterality: Right;   JOINT REPLACEMENT     knee arthroscopy  10/28/1999   RIGHT   Laparoscopy with laparoscopic right salpingo oophorectomy and lysis of pelvic and abdominal adhesions  07/2011   Dr. Jennette Kettle    LEFT TOTAL KNEE ARTHROPLASTY (Left Knee)  07/30/2020   MASTECTOMY MODIFIED RADICAL Left 12/04/2010   W/ LEFT PAC PLACEMENT (RIGHT BREAST W/ AXILLARY CONTENTS/ NODE BX'S   PORT-A-CATH REMOVAL  09/24/2011   Procedure: REMOVAL PORT-A-CATH;  Surgeon: Clovis Pu.  Cornett, MD;  Location: [facility];  Service: General;  Laterality: N/A;   PORTACATH PLACEMENT     REPLACED PAC DUE TO MALFUNCTION (LEFT)   TOTAL KNEE ARTHROPLASTY Right 08/16/2012   Procedure: TOTAL KNEE ARTHROPLASTY;  Surgeon: [name], MD;  Location: [facility];  Service: Orthopedics;  Laterality: Right;   TOTAL KNEE ARTHROPLASTY Left 07/30/2020   Procedure: LEFT TOTAL KNEE ARTHROPLASTY;  Surgeon: Tarry Kos, MD;  Location: [facility];  Service: Orthopedics;  Laterality: Left;   TRANSTHORACIC ECHOCARDIOGRAM  10/29/2010   NORMAL LVF/ EF 55-60%/ MILD MV REGURG   TUBAL  LIGATION  YRS AGO   VAGINAL HYSTERECTOMY  AGE 80   W/ LSO    OB History     Gravida  4   Para  4   Term  4   Preterm      AB      Living  4      SAB      IAB      Ectopic      Multiple      Live Births               Home Medications    Prior to Admission medications   Medication Sig Start Date End Date Taking? Authorizing Provider  Accu-Chek Softclix Lancets lancets Check blood sugar up to 7 times per week 07/22/22   Belva Agee, MD  atorvastatin (LIPITOR) 40 MG tablet Take 1 tablet (40 mg total) by mouth daily. 12/29/22 03/29/23  Morrie Sheldon, MD  chlorthalidone (HYGROTON) 25 MG tablet Take 1 tablet (25 mg total) by mouth daily. 12/29/22   Morrie Sheldon, MD  cyclobenzaprine (FLEXERIL) 10 MG tablet Take 1 tablet (10 mg total) by mouth 2 (two) times daily as needed for muscle spasms. 01/16/23   Rondel Baton, MD  Dulaglutide (TRULICITY) 1.5 MG/0.5ML SOAJ Inject 1.5 mg into the skin once a week. 12/29/22   Morrie Sheldon, MD  empagliflozin (JARDIANCE) 10 MG TABS tablet Take 1 tablet (10 mg total) by mouth daily before breakfast. 12/29/22   Morrie Sheldon, MD  fluticasone (FLONASE) 50 MCG/ACT nasal spray Place 1 spray into both nostrils daily. 12/11/21   Atway, Rayann N, DO  glipiZIDE (GLUCOTROL) 5 MG tablet Take 0.5 tablets (2.5 mg total) by mouth 2 (two) times daily. 12/29/22   Morrie Sheldon, MD  glucose blood (ACCU-CHEK GUIDE) test strip Check blood sugar up to 7 times per week 07/22/22   Belva Agee, MD  Insulin Pen Needle 32G X 4 MM MISC Use as directed with victoza 05/27/22   [name], Vasilios, MD  lisinopril (ZESTRIL) 20 MG tablet Take 1 tablet (20 mg total) by mouth daily. 12/29/22   Morrie Sheldon, MD  loratadine (CLARITIN) 10 MG tablet Take 1 tablet (10 mg total) by mouth daily. 09/19/21 12/18/21  Dellis Filbert, MD  methylPREDNISolone (MEDROL DOSEPAK) 4 MG TBPK tablet Take as directed 01/02/23   Jannifer Franklin, MD  polyethylene glycol  (MIRALAX) 17 g packet Take 17 g by mouth daily. 09/05/21   Dellis Filbert, MD    Family History Family History  Problem Relation Age of Onset   Stroke Father    Breast cancer Sister    Cancer Sister        breast   Cancer Sister        COLON CANCER   Stroke Sister    Heart disease Brother     Social History Social History   Tobacco Use   Smoking  status: Never   Smokeless tobacco: Never  Vaping Use   Vaping status: Never Used  Substance Use Topics   Alcohol use: No    Alcohol/week: 0.0 standard drinks of alcohol   Drug use: No     Allergies   Hydrochlorothiazide   Review of Systems Review of Systems   Physical Exam Triage Vital Signs ED Triage Vitals [03/18/23 1029]  Encounter Vitals Group     BP (!) 158/81     Systolic BP Percentile      Diastolic BP Percentile      Pulse Rate 75     Resp 16     Temp (!) 97.5 F (36.4 C)     Temp Source Oral     SpO2 96 %     Weight      Height      Head Circumference      Peak Flow      Pain Score 4     Pain Loc      Pain Education      Exclude from Growth Chart    No data found.  Updated Vital Signs BP (!) 158/81 (BP Location: Left Arm)   Pulse 75   Temp (!) 97.5 F (36.4 C) (Oral)   Resp 16   SpO2 96%   Visual Acuity Right Eye Distance:   Left Eye Distance:   Bilateral Distance:    Right Eye Near:   Left Eye Near:    Bilateral Near:     Physical Exam Constitutional:      General: She is not in acute distress.    Appearance: Normal appearance. She is not ill-appearing.  Musculoskeletal:        General: Tenderness present. No swelling or deformity. Normal range of motion.     Comments: Reproducible TTP to firm palpation across bilateral thoracic spine laterally across her mid axillary line to about the mid clavicular line. No midline thoracic spine TTP or step-off.  Neurological:     General: No focal deficit present.     Mental Status: She is alert and oriented to person, place, and time.  Mental status is at baseline.  Psychiatric:        Mood and Affect: Mood normal.        Behavior: Behavior normal.      UC Treatments / Results  Labs (all labs ordered are listed, but only abnormal results are displayed) Labs Reviewed - No data to display  EKG   Radiology No results found.  Procedures Procedures (including critical care time)  Medications Ordered in UC Medications - No data to display  Initial Impression / Assessment and Plan / UC Course  I have reviewed the triage vital signs and the nursing notes.  Pertinent labs & imaging results that were available during my care of the patient were reviewed by me and considered in my medical decision making (see chart for details).   Vitals and triage reviewed, patient is hemodynamically stable.  Suspect muscular etiology and low suspicion for nerve compression in thoracic spine. Recommended mentioning this with PT to incorporate some thoracic spine rehab as well as her lumbar spondylosis and sciatica. Advised increasing tylenol to 1000 TID and icyhot or voltaren topical use. Follow-up with NSGY as needed. Patient's questions were answered and they are in agreement with this plan.   Final Clinical Impressions(s) / UC Diagnoses   Final diagnoses:  Acute bilateral thoracic back pain     Discharge Instructions  Recommend you continue the Tylenol, but increase to 1000mg  (two extra strength tablets) every 8 hours as needed for pain.  I also recommend using topical Voltaren (diclofenac) or icy hot which can be picked up over the counter at your pharmacy.   Follow-up with your neurosurgery office as needed for this issue, especially if failing to improve with physical therapy.     ED Prescriptions   None    PDMP not reviewed this encounter.   Marisa Cyphers, MD 03/18/23 1100

## 2023-03-27 DIAGNOSIS — M5451 Vertebrogenic low back pain: Secondary | ICD-10-CM | POA: Diagnosis not present

## 2023-03-30 DIAGNOSIS — M5416 Radiculopathy, lumbar region: Secondary | ICD-10-CM | POA: Diagnosis not present

## 2023-03-31 DIAGNOSIS — C50911 Malignant neoplasm of unspecified site of right female breast: Secondary | ICD-10-CM | POA: Diagnosis not present

## 2023-04-01 DIAGNOSIS — C50911 Malignant neoplasm of unspecified site of right female breast: Secondary | ICD-10-CM | POA: Diagnosis not present

## 2023-04-01 DIAGNOSIS — M5451 Vertebrogenic low back pain: Secondary | ICD-10-CM | POA: Diagnosis not present

## 2023-04-07 ENCOUNTER — Other Ambulatory Visit (HOSPITAL_COMMUNITY): Payer: Self-pay

## 2023-04-07 DIAGNOSIS — R109 Unspecified abdominal pain: Secondary | ICD-10-CM | POA: Diagnosis not present

## 2023-04-07 DIAGNOSIS — E119 Type 2 diabetes mellitus without complications: Secondary | ICD-10-CM | POA: Diagnosis not present

## 2023-04-07 DIAGNOSIS — I10 Essential (primary) hypertension: Secondary | ICD-10-CM | POA: Diagnosis not present

## 2023-04-10 DIAGNOSIS — M5451 Vertebrogenic low back pain: Secondary | ICD-10-CM | POA: Diagnosis not present

## 2023-04-17 DIAGNOSIS — M5451 Vertebrogenic low back pain: Secondary | ICD-10-CM | POA: Diagnosis not present

## 2023-04-24 DIAGNOSIS — M5451 Vertebrogenic low back pain: Secondary | ICD-10-CM | POA: Diagnosis not present

## 2023-04-30 ENCOUNTER — Encounter: Payer: Self-pay | Admitting: Internal Medicine

## 2023-04-30 DIAGNOSIS — M5451 Vertebrogenic low back pain: Secondary | ICD-10-CM | POA: Diagnosis not present

## 2023-04-30 NOTE — Assessment & Plan Note (Signed)
Last colonoscopy in 2021. Repeat colonoscopy in 2026.

## 2023-04-30 NOTE — Progress Notes (Unsigned)
CC: HTN and DMII follow up  HPI:  [name] is a 77 y.o. with medical history of HTN, HLD, DMII, suspected CKDIIIa, chronic ovarian vein thrombosis presenting to [facility] for DMII and HTN follow up.   Please see problem-based list for further details, assessments, and plans.  Past Medical History:  Diagnosis Date   Allergy    Anemia    Bilateral carpal tunnel syndrome 06/29/2018   Breast CA (HCC)    (Rt) breast ca dx 4/12---  S/P RADICAL MASTECTOMY AND CHEMORADIATION   Diabetes mellitus ORAL MED   Fatty liver disease, nonalcoholic    History of hyperkalemia    Hx antineoplastic chemo  10/12 - 06/24/11   PACLITAX EL COMPLETED 06/24/11   Hyperlipidemia    Hypertension    Mild nonproliferative diabetic retinopathy of right eye (HCC) 03/19/2011   Dr. Dede Query Poudyal   Numbness and tingling    Hx: of in fingers and toes since chemotherapy   OA (osteoarthritis) of knee    RIGHT LEG   Obesity    Personal history of chemotherapy    Personal history of radiation therapy    Renal lesion    Status post total left knee replacement 07/30/2020    Current Outpatient Medications (Endocrine & Metabolic):    Dulaglutide (TRULICITY) 1.5 MG/0.5ML SOAJ, Inject 1.5 mg into the skin once a week.   empagliflozin (JARDIANCE) 10 MG TABS tablet, Take 1 tablet (10 mg total) by mouth daily before breakfast.   glipiZIDE (GLUCOTROL) 5 MG tablet, Take 0.5 tablets (2.5 mg total) by mouth 2 (two) times daily.  Current Outpatient Medications (Cardiovascular):    atorvastatin (LIPITOR) 40 MG tablet, Take 1 tablet (40 mg total) by mouth daily.   chlorthalidone (HYGROTON) 25 MG tablet, Take 1 tablet (25 mg total) by mouth daily.   lisinopril (ZESTRIL) 20 MG tablet, Take 1 tablet (20 mg total) by mouth daily.  Current Outpatient Medications (Respiratory):    fluticasone (FLONASE) 50 MCG/ACT nasal spray, Place 1 spray into both nostrils daily.   loratadine (CLARITIN) 10 MG tablet, Take 1 tablet (10 mg  total) by mouth daily.    Current Outpatient Medications (Other):    cholecalciferol (VITAMIN D3) 25 MCG (1000 UNIT) tablet, Take 2 tablets (2,000 Units total) by mouth daily.   senna-docusate (SENOKOT-S) 8.6-50 MG tablet, Take 2 tablets by mouth at bedtime.   Accu-Chek Softclix Lancets lancets, Check blood sugar up to 7 times per week   cyclobenzaprine (FLEXERIL) 10 MG tablet, Take 1 tablet (10 mg total) by mouth 2 (two) times daily as needed for muscle spasms.   glucose blood (ACCU-CHEK GUIDE) test strip, Check blood sugar up to 7 times per week   Insulin Pen Needle 32G X 4 MM MISC, Use as directed with victoza   polyethylene glycol (MIRALAX) 17 g packet, Take 17 g by mouth daily.  Review of Systems:  Review of system negative unless stated in the problem list or HPI.    Physical Exam:  Vitals:   05/01/23 1006  BP: 122/60  Pulse: 90  Temp: 98.4 F (36.9 C)  TempSrc: Oral  Weight: 151 lb 4.8 oz (68.6 kg)   Physical Exam General: NAD HENT: NCAT Lungs: CTAB, no wheeze, rhonchi or rales.  Cardiovascular: Normal heart sounds, no r/m/g, 2+ pulses in all extremities. No LE edema Abdomen: No TTP, normal bowel sounds MSK: No asymmetry or muscle atrophy.  Skin: no lesions noted on exposed skin Neuro: Alert and oriented x4. CN grossly  intact Psych: Normal mood and normal affect   Assessment & Plan:   History of colonic polyps Last colonoscopy in 2021. Repeat colonoscopy in 2026.   CKD stage 3a, GFR 45-59 ml/min (HCC) Creatinine and GFR normal on repeat lab work. Suspect some hypovolemia present previously secondary to the chlorthalidone. No CKD evident on lab work with normal ACR and normal creatinine and GFR.   Hypertension associated with diabetes (HCC) Pt's BP well controlled on Lisinopril and Chlorthalidone 25 mg daily. Repeat renal panel shows normal renal function. Pt may benefit from decreasing chlorthalidone dose and potential addition of CCB. This can be discussed with  the patient at next visit.   Type 2 diabetes mellitus with renal manifestations (HCC) Well controlled on current regime of Glipizide 5 mg every day, Jardiance 10 mg every day, and Trulicity 1.5 mg weekly. A1c this visit 7 %.   Hyperlipidemia Patient with hx of HLD. Pt is on lipitor 40 mg every day. LDL this visit is 86 and ASCVD is 22 %. Goal LDL for her would be <70 given pt had reversible ischemia on previous stress test. No current complaints. Pt will benefit from addition of zetia. Will discuss this with the patient.   Health care maintenance Flu shot given this visit. Dexa scan ordered.   Osteopenia Patient with hx of osteopenia noted on dexa scan. Last dexa scan showed normal results. Will repeat dexa scan and check vitamin D level. Vitamin D level was 29. Will order daily 2000 units repletion.   Thrombosis of ovarian vein Pt has hx of ovarian vein thrombosis that appears stable on imaging. Pt reports intermittent abdominal pain in her lower quadrants. She describes this as sharp pain. Unable to tell any inciting factors of the pain on number of times of times this occurs. She has known ovarian vein thrombosis that is stable. Her back has significantly improved after her epidural injection last month. Her pain can be explained by constipation so will treat this aggressively before committing her to [facility]. Will aim for daily bowel movement and have her keep a diary of her abdominal pain. If abdominal pain is secondary to her thrombosis, treating it should help with the pain and minimize risk such as VTE. Although her advanced age may predispose her to negative outcomes with [facility], she has very good functional status and if needed should tolerate AC well. MRV of the pelvis can be used to better visualize the thrombosis to see if it is improving or worsening as they can resolve spontaneously.   Constipation Patient with chronic constipation. Given her complaint of abdominal pain, will treat this  aggressively with Sennakot-s 2 tablets nightly and Miralax. Aim for daily bowel movement. Follow up in one month on her intermittent abdominal pain after treatment of her constipation.    See Encounters Tab for problem based charting.  Patient Discussed with Dr. Bryson Corona, MD Eligha Bridegroom. [facility] Internal Medicine Residency, PGY-3

## 2023-05-01 ENCOUNTER — Ambulatory Visit (INDEPENDENT_AMBULATORY_CARE_PROVIDER_SITE_OTHER): Payer: Medicare Other | Admitting: Internal Medicine

## 2023-05-01 VITALS — BP 122/60 | HR 90 | Temp 98.4°F | Wt 151.3 lb

## 2023-05-01 DIAGNOSIS — E1121 Type 2 diabetes mellitus with diabetic nephropathy: Secondary | ICD-10-CM

## 2023-05-01 DIAGNOSIS — E559 Vitamin D deficiency, unspecified: Secondary | ICD-10-CM | POA: Diagnosis not present

## 2023-05-01 DIAGNOSIS — N1831 Chronic kidney disease, stage 3a: Secondary | ICD-10-CM

## 2023-05-01 DIAGNOSIS — I82890 Acute embolism and thrombosis of other specified veins: Secondary | ICD-10-CM

## 2023-05-01 DIAGNOSIS — Z23 Encounter for immunization: Secondary | ICD-10-CM

## 2023-05-01 DIAGNOSIS — M8580 Other specified disorders of bone density and structure, unspecified site: Secondary | ICD-10-CM

## 2023-05-01 DIAGNOSIS — E1159 Type 2 diabetes mellitus with other circulatory complications: Secondary | ICD-10-CM

## 2023-05-01 DIAGNOSIS — K5909 Other constipation: Secondary | ICD-10-CM

## 2023-05-01 DIAGNOSIS — E785 Hyperlipidemia, unspecified: Secondary | ICD-10-CM

## 2023-05-01 DIAGNOSIS — Z7985 Long-term (current) use of injectable non-insulin antidiabetic drugs: Secondary | ICD-10-CM | POA: Diagnosis not present

## 2023-05-01 DIAGNOSIS — Z7984 Long term (current) use of oral hypoglycemic drugs: Secondary | ICD-10-CM | POA: Diagnosis not present

## 2023-05-01 DIAGNOSIS — Z860100 Personal history of colon polyps, unspecified: Secondary | ICD-10-CM | POA: Diagnosis not present

## 2023-05-01 DIAGNOSIS — K5900 Constipation, unspecified: Secondary | ICD-10-CM

## 2023-05-01 DIAGNOSIS — Z0000 Encounter for general adult medical examination without abnormal findings: Secondary | ICD-10-CM

## 2023-05-01 DIAGNOSIS — I152 Hypertension secondary to endocrine disorders: Secondary | ICD-10-CM | POA: Diagnosis not present

## 2023-05-01 LAB — POCT GLYCOSYLATED HEMOGLOBIN (HGB A1C): Hemoglobin A1C: 7 % — AB (ref 4.0–5.6)

## 2023-05-01 LAB — GLUCOSE, CAPILLARY: Glucose-Capillary: 168 mg/dL — ABNORMAL HIGH (ref 70–99)

## 2023-05-01 MED ORDER — SENNOSIDES-DOCUSATE SODIUM 8.6-50 MG PO TABS
2.0000 | ORAL_TABLET | Freq: Every day | ORAL | 0 refills | Status: AC
Start: 2023-05-01 — End: 2023-05-31

## 2023-05-01 NOTE — Patient Instructions (Signed)
[name], it was a pleasure seeing you today! You endorsed feeling well today. Below are some of the things we talked about this visit. We look forward to seeing you in the follow up appointment!  Today we discussed: You are doing a great job controlling your blood pressure and diabetes.   For your intermittent belly pain, I am going to start a medication to help you get regular bowel movement. We will follow up in one month to see if your belly pain improves. Keep a log of the belly pain in the mean time. If you get pain that does not go away go to urgent care or the emergency department.   I am going to get some lab work this visit.   I have ordered the following labs today:   Lab Orders         Glucose, capillary         BMP8+Anion Gap         Lipid Profile         Vitamin D (25 hydroxy)         POC Hbg A1C       Referrals ordered today:   Referral Orders  No referral(s) requested today     I have ordered the following medication/changed the following medications:   Stop the following medications: Medications Discontinued During This Encounter  Medication Reason   methylPREDNISolone (MEDROL DOSEPAK) 4 MG TBPK tablet Completed Course     Start the following medications: Meds ordered this encounter  Medications   senna-docusate (SENOKOT-S) 8.6-50 MG tablet    Sig: Take 2 tablets by mouth at bedtime.    Dispense:  60 tablet    Refill:  0     Follow-up: 1 month follow up   Please make sure to arrive 15 minutes prior to your next appointment. If you arrive late, you may be asked to reschedule.   We look forward to seeing you next time. Please call our clinic at [phone] if you have any questions or concerns. The best time to call is Monday-Friday from 9am-4pm, but there is someone available 24/7. If after hours or the weekend, call the main hospital number and ask for the Internal Medicine Resident On-Call. If you need medication refills, please notify  your pharmacy one week in advance and they will send [place] a request.  Thank you for letting us take part in your care. Wishing you the best!  Thank you, Gwenevere Abbot, MD

## 2023-05-02 LAB — LIPID PANEL
Chol/HDL Ratio: 3.7 {ratio} (ref 0.0–4.4)
Cholesterol, Total: 152 mg/dL (ref 100–199)
HDL: 41 mg/dL (ref >39)
LDL Chol Calc (NIH): 86 mg/dL (ref 0–99)
Triglycerides: 143 mg/dL (ref 0–149)
VLDL Cholesterol Cal: 25 mg/dL (ref 5–40)

## 2023-05-02 LAB — BMP8+ANION GAP
Anion Gap: 16 mmol/L (ref 10.0–18.0)
BUN/Creatinine Ratio: 18 (ref 12–28)
BUN: 17 mg/dL (ref 8–27)
CO2: 27 mmol/L (ref 20–29)
Calcium: 10.3 mg/dL (ref 8.7–10.3)
Chloride: 99 mmol/L (ref 96–106)
Creatinine, Ser: 0.96 mg/dL (ref 0.57–1.00)
Glucose: 111 mg/dL — ABNORMAL HIGH (ref 70–99)
Potassium: 3.7 mmol/L (ref 3.5–5.2)
Sodium: 142 mmol/L (ref 134–144)
eGFR: 62 mL/min/{1.73_m2} (ref >59)

## 2023-05-02 LAB — VITAMIN D 25 HYDROXY (VIT D DEFICIENCY, FRACTURES): Vit D, 25-Hydroxy: 29.4 ng/mL — ABNORMAL LOW (ref 30.0–100.0)

## 2023-05-02 MED ORDER — VITAMIN D 25 MCG (1000 UNIT) PO TABS: 2000.0000 [IU] | ORAL_TABLET | Freq: Every day | ORAL | 2 refills | Status: DC

## 2023-05-02 MED ORDER — EZETIMIBE 10 MG PO TABS
10.0000 mg | ORAL_TABLET | Freq: Every day | ORAL | 3 refills | Status: DC
Start: 2023-05-02 — End: 2024-03-25

## 2023-05-02 NOTE — Assessment & Plan Note (Signed)
Patient with chronic constipation. Given her complaint of abdominal pain, will treat this aggressively with Sennakot-s 2 tablets nightly and Miralax. Aim for daily bowel movement. Follow up in one month on her intermittent abdominal pain after treatment of her constipation.

## 2023-05-02 NOTE — Assessment & Plan Note (Signed)
Flu shot given this visit. Dexa scan ordered.

## 2023-05-02 NOTE — Assessment & Plan Note (Signed)
Patient with hx of HLD. Pt is on lipitor 40 mg every day. LDL this visit is 86 and ASCVD is 22 %. Goal LDL for her would be <70 given pt had reversible ischemia on previous stress test. No current complaints. Pt will benefit from addition of zetia. Will discuss this with the patient.

## 2023-05-02 NOTE — Assessment & Plan Note (Signed)
Well controlled on current regime of Glipizide 5 mg every day, Jardiance 10 mg every day, and Trulicity 1.5 mg weekly. A1c this visit 7 %.

## 2023-05-02 NOTE — Assessment & Plan Note (Addendum)
Pt has hx of ovarian vein thrombosis that appears stable on imaging. Pt reports intermittent abdominal pain in her lower quadrants. She describes this as sharp pain. Unable to tell any inciting factors of the pain on number of times of times this occurs. She has known ovarian vein thrombosis that is stable. Her back has significantly improved after her epidural injection last month. Her pain can be explained by constipation so will treat this aggressively before committing her to [facility]. Will aim for daily bowel movement and have her keep a diary of her abdominal pain. If abdominal pain is secondary to her thrombosis, treating it should help with the pain and minimize risk such as VTE. Although her advanced age may predispose her to negative outcomes with [facility], she has very good functional status and if needed should tolerate AC well. MRV of the pelvis can be used to better visualize the thrombosis to see if it is improving or worsening as they can resolve spontaneously.

## 2023-05-02 NOTE — Assessment & Plan Note (Addendum)
Creatinine and GFR normal on repeat lab work. Suspect some hypovolemia present previously secondary to the chlorthalidone. No CKD evident on lab work with normal ACR and normal creatinine and GFR.

## 2023-05-02 NOTE — Assessment & Plan Note (Signed)
Patient with hx of osteopenia noted on dexa scan. Last dexa scan showed normal results. Will repeat dexa scan and check vitamin D level. Vitamin D level was 29. Will order daily 2000 units repletion.

## 2023-05-02 NOTE — Assessment & Plan Note (Signed)
Pt's BP well controlled on Lisinopril and Chlorthalidone 25 mg daily. Repeat renal panel shows normal renal function. Pt may benefit from decreasing chlorthalidone dose and potential addition of CCB. This can be discussed with the patient at next visit.

## 2023-05-04 NOTE — Progress Notes (Signed)
Internal Medicine Clinic Attending  Case discussed with the resident at the time of the visit.  We reviewed the resident's history and exam and pertinent patient test results.  I agree with the assessment, diagnosis, and plan of care documented in the resident's note.    At follow up visit, please assess if patient's abdominal discomfort has improved with treating her constipation.

## 2023-05-05 DIAGNOSIS — M5451 Vertebrogenic low back pain: Secondary | ICD-10-CM | POA: Diagnosis not present

## 2023-05-08 ENCOUNTER — Other Ambulatory Visit (HOSPITAL_COMMUNITY): Payer: Self-pay

## 2023-05-08 DIAGNOSIS — M5451 Vertebrogenic low back pain: Secondary | ICD-10-CM | POA: Diagnosis not present

## 2023-05-12 DIAGNOSIS — M5451 Vertebrogenic low back pain: Secondary | ICD-10-CM | POA: Diagnosis not present

## 2023-05-15 DIAGNOSIS — M5451 Vertebrogenic low back pain: Secondary | ICD-10-CM | POA: Diagnosis not present

## 2023-05-19 DIAGNOSIS — M5451 Vertebrogenic low back pain: Secondary | ICD-10-CM | POA: Diagnosis not present

## 2023-05-20 DIAGNOSIS — M5416 Radiculopathy, lumbar region: Secondary | ICD-10-CM | POA: Diagnosis not present

## 2023-05-22 ENCOUNTER — Encounter: Payer: Self-pay | Admitting: Orthopaedic Surgery

## 2023-05-22 ENCOUNTER — Ambulatory Visit (INDEPENDENT_AMBULATORY_CARE_PROVIDER_SITE_OTHER): Payer: Medicare Other | Admitting: Orthopaedic Surgery

## 2023-05-22 DIAGNOSIS — G5601 Carpal tunnel syndrome, right upper limb: Secondary | ICD-10-CM

## 2023-05-22 NOTE — Progress Notes (Signed)
Office Visit Note   Patient: [name]           Date of Birth: 10/11/1945           MRN: [id number] Visit Date: 05/22/2023              Requested by: Morrie Sheldon, MD [address] [place],  [place] [address] PCP: Morrie Sheldon, MD   Assessment & Plan: Visit Diagnoses:  1. Right carpal tunnel syndrome     Plan: [name] is a very pleasant 78 year old female with moderate to severe right carpal tunnel syndrome.  She had a nerve conduction study 4 years ago.  She is status post left carpal tunnel release 2022.  She has done well from this.  At this time she would like to move forward with a right carpal tunnel release due to significant worsening in her symptoms.  [name] will call her to confirm surgery date.  Follow-Up Instructions: No follow-ups on file.   Orders:  No orders of the defined types were placed in this encounter.  No orders of the defined types were placed in this encounter.     Procedures: No procedures performed   Clinical Data: No additional findings.   Subjective: Chief Complaint  Patient presents with   Right Hand - Pain    HPI [name] is a very pleasant 78 year old female comes in for evaluation of worsening right carpal tunnel symptoms.  She had nerve conduction studies about 5 years ago which showed moderate to severe disease.  She underwent a left carpal tunnel release and 2022 and has done well from this.  She feels that she has waited too long to have surgery on her right hand. Review of Systems  Constitutional: Negative.   HENT: Negative.    Eyes: Negative.   Respiratory: Negative.    Cardiovascular: Negative.   Endocrine: Negative.   Musculoskeletal: Negative.   Neurological: Negative.   Hematological: Negative.   Psychiatric/Behavioral: Negative.    All other systems reviewed and are negative.    Objective: Vital Signs: There were no vitals taken for this visit.  Physical Exam Vitals and nursing note reviewed.   Constitutional:      Appearance: She is well-developed.  HENT:     Head: Normocephalic and atraumatic.  Pulmonary:     Effort: Pulmonary effort is normal.  Abdominal:     Palpations: Abdomen is soft.  Musculoskeletal:     Cervical back: Neck supple.  Skin:    General: Skin is warm.     Capillary Refill: Capillary refill takes less than 2 seconds.  Neurological:     Mental Status: She is alert and oriented to person, place, and time.  Psychiatric:        Behavior: Behavior normal.        Thought Content: Thought content normal.        Judgment: Judgment normal.     Ortho Exam Examination right hand shows positive carpal tunnel compressive signs.  Mild thenar flattening.  No neurovascular compromise. Specialty Comments:  No specialty comments available.  Imaging: No results found.   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   Primary osteoarthritis of left knee 01/25/2019   Cervical neuropathy 08/20/2018   Constipation 01/15/2016   Low back pain 12/20/2014   Osteopenia 12/13/2013   Vaginal discharge 12/13/2013   Diabetic autonomic neuropathy associated with type 2 diabetes mellitus (HCC) 11/02/2013  Health care maintenance 11/17/2012   Breast cancer, right breast (HCC) 09/25/2010   History of colonic polyps 07/19/2007   OBESITY NOS 07/09/2006   Type 2 diabetes mellitus with renal manifestations (HCC) 03/10/2006   Hyperlipidemia 03/10/2006   Hypertension associated with diabetes (HCC) 03/10/2006   Past Medical History:  Diagnosis Date   Allergy    Anemia    Bilateral carpal tunnel syndrome 06/29/2018   Breast CA (HCC)    (Rt) breast ca dx 4/12---  S/P RADICAL MASTECTOMY AND CHEMORADIATION   Diabetes mellitus ORAL MED   Fatty liver disease, nonalcoholic    History of hyperkalemia    Hx antineoplastic chemo  10/12 - 06/24/11   PACLITAX EL COMPLETED 06/24/11   Hyperlipidemia    Hypertension     Mild nonproliferative diabetic retinopathy of right eye (HCC) 03/19/2011   Dr. Dede Query Poudyal   Numbness and tingling    Hx: of in fingers and toes since chemotherapy   OA (osteoarthritis) of knee    RIGHT LEG   Obesity    Personal history of chemotherapy    Personal history of radiation therapy    Renal lesion    Status post total left knee replacement 07/30/2020    Family History  Problem Relation Age of Onset   Stroke Father    Breast cancer Sister    Cancer Sister        breast   Cancer Sister        COLON CANCER   Stroke Sister    Heart disease Brother     Past Surgical History:  Procedure Laterality Date   COLONOSCOPY W/ POLYPECTOMY     Hx; of   I & D KNEE WITH POLY EXCHANGE Right 09/03/2012   Procedure: IRRIGATION AND DEBRIDEMENT RIGHT KNEE WITH POLY EXCHANGE;  Surgeon: [name], MD;  Location: [facility] OR;  Service: Orthopedics;  Laterality: Right;   JOINT REPLACEMENT     knee arthroscopy  10/28/1999   RIGHT   Laparoscopy with laparoscopic right salpingo oophorectomy and lysis of pelvic and abdominal adhesions  07/2011   Dr. Jennette Kettle    LEFT TOTAL KNEE ARTHROPLASTY (Left Knee)  07/30/2020   MASTECTOMY MODIFIED RADICAL Left 12/04/2010   W/ LEFT PAC PLACEMENT (RIGHT BREAST W/ AXILLARY CONTENTS/ NODE BX'S   PORT-A-CATH REMOVAL  09/24/2011   Procedure: REMOVAL PORT-A-CATH;  Surgeon: Clovis Pu. Cornett, MD;  Location: [facility];  Service: General;  Laterality: N/A;   PORTACATH PLACEMENT     REPLACED PAC DUE TO MALFUNCTION (LEFT)   TOTAL KNEE ARTHROPLASTY Right 08/16/2012   Procedure: TOTAL KNEE ARTHROPLASTY;  Surgeon: [name], MD;  Location: [facility] OR;  Service: Orthopedics;  Laterality: Right;   TOTAL KNEE ARTHROPLASTY Left 07/30/2020   Procedure: LEFT TOTAL KNEE ARTHROPLASTY;  Surgeon: Tarry Kos, MD;  Location: [facility] OR;  Service: Orthopedics;  Laterality: Left;   TRANSTHORACIC ECHOCARDIOGRAM  10/29/2010   NORMAL LVF/ EF 55-60%/ MILD MV REGURG   TUBAL LIGATION  YRS AGO    VAGINAL HYSTERECTOMY  AGE 34   W/ LSO   Social History   Occupational History   Not on file  Tobacco Use   Smoking status: Never   Smokeless tobacco: Never  Vaping Use   Vaping status: Never Used  Substance and Sexual Activity   Alcohol use: No    Alcohol/week: 0.0 standard drinks of alcohol   Drug use: No   Sexual activity: Not on file    Comment: MENARCHE AGE 57, G4,  P4, PARITY AGE 28, HRT 20+ YEARS

## 2023-05-24 ENCOUNTER — Emergency Department (HOSPITAL_COMMUNITY)
Admission: EM | Admit: 2023-05-24 | Discharge: 2023-05-24 | Disposition: A | Discharge status: Home / Self Care | Payer: Medicare Other | Attending: Emergency Medicine | Admitting: Emergency Medicine

## 2023-05-24 ENCOUNTER — Other Ambulatory Visit: Payer: Self-pay

## 2023-05-24 ENCOUNTER — Emergency Department (HOSPITAL_COMMUNITY): Payer: Medicare Other

## 2023-05-24 ENCOUNTER — Emergency Department (HOSPITAL_BASED_OUTPATIENT_CLINIC_OR_DEPARTMENT_OTHER): Payer: Medicare Other

## 2023-05-24 DIAGNOSIS — Z794 Long term (current) use of insulin: Secondary | ICD-10-CM | POA: Insufficient documentation

## 2023-05-24 DIAGNOSIS — Z96652 Presence of left artificial knee joint: Secondary | ICD-10-CM | POA: Diagnosis not present

## 2023-05-24 DIAGNOSIS — R609 Edema, unspecified: Secondary | ICD-10-CM | POA: Diagnosis not present

## 2023-05-24 DIAGNOSIS — M25561 Pain in right knee: Secondary | ICD-10-CM | POA: Diagnosis not present

## 2023-05-24 DIAGNOSIS — Z7984 Long term (current) use of oral hypoglycemic drugs: Secondary | ICD-10-CM | POA: Insufficient documentation

## 2023-05-24 DIAGNOSIS — M7989 Other specified soft tissue disorders: Secondary | ICD-10-CM | POA: Diagnosis not present

## 2023-05-24 DIAGNOSIS — E119 Type 2 diabetes mellitus without complications: Secondary | ICD-10-CM | POA: Insufficient documentation

## 2023-05-24 DIAGNOSIS — I10 Essential (primary) hypertension: Secondary | ICD-10-CM | POA: Insufficient documentation

## 2023-05-24 DIAGNOSIS — Z79899 Other long term (current) drug therapy: Secondary | ICD-10-CM | POA: Diagnosis not present

## 2023-05-24 DIAGNOSIS — M79604 Pain in right leg: Secondary | ICD-10-CM | POA: Diagnosis not present

## 2023-05-24 DIAGNOSIS — M25461 Effusion, right knee: Secondary | ICD-10-CM | POA: Diagnosis not present

## 2023-05-24 MED ORDER — NAPROXEN 250 MG PO TABS
500.0000 mg | ORAL_TABLET | Freq: Once | ORAL | Status: AC
  Administered 2023-05-24: 500 mg via ORAL
  Filled 2023-05-24: qty 2

## 2023-05-24 MED ORDER — NAPROXEN 375 MG PO TABS: 375.0000 mg | ORAL_TABLET | Freq: Two times a day (BID) | ORAL | 0 refills | Status: DC

## 2023-05-24 NOTE — ED Provider Notes (Signed)
[place] [facility]ERGENCY DEPARTMENT AT [facility] Provider Note   CSN: [id number] Arrival date & time: 05/24/23  1045     History  Chief Complaint  Patient presents with   Knee Pain    [name] is a 78 y.o. female with past medical history of type 2 diabetes, HLD, HTN, left knee replacement presents to the emergency department for evaluation of right knee pain and swelling that started yesterday morning following waking.  She reports that it has since worsened and has pain with ambulation causing her to seek ED evaluation.  She has not tried OTC medications.  She denies falls, traumatic injury, fever.  She is currently in PT for "pinched nerves in back".    Knee Pain Associated symptoms: no fatigue and no fever       Home Medications Prior to Admission medications   Medication Sig Start Date End Date Taking? Authorizing Provider  naproxen (NAPROSYN) 375 MG tablet Take 1 tablet (375 mg total) by mouth 2 (two) times daily. 05/24/23  Yes Judithann Sheen, PA  Accu-Chek Softclix Lancets lancets Check blood sugar up to 7 times per week 07/22/22   Belva Agee, MD  atorvastatin (LIPITOR) 40 MG tablet Take 1 tablet (40 mg total) by mouth daily. 12/29/22 03/29/23  Morrie Sheldon, MD  chlorthalidone (HYGROTON) 25 MG tablet Take 1 tablet (25 mg total) by mouth daily. 12/29/22   Morrie Sheldon, MD  cholecalciferol (VITAMIN D3) 25 MCG (1000 UNIT) tablet Take 2 tablets (2,000 Units total) by mouth daily. 05/02/23 07/31/23  Gwenevere Abbot, MD  cyclobenzaprine (FLEXERIL) 10 MG tablet Take 1 tablet (10 mg total) by mouth 2 (two) times daily as needed for muscle spasms. 01/16/23   Rondel Baton, MD  Dulaglutide (TRULICITY) 1.5 MG/0.5ML SOAJ Inject 1.5 mg into the skin once a week. 12/29/22   Morrie Sheldon, MD  empagliflozin (JARDIANCE) 10 MG TABS tablet Take 1 tablet (10 mg total) by mouth daily before breakfast. 12/29/22   Morrie Sheldon, MD  ezetimibe (ZETIA) 10 MG tablet Take  1 tablet (10 mg total) by mouth daily. 05/02/23 05/01/24  Gwenevere Abbot, MD  fluticasone [facility]) 50 MCG/ACT nasal spray Place 1 spray into both nostrils daily. 12/11/21   Atway, Rayann N, DO  glipiZIDE (GLUCOTROL) 5 MG tablet Take 0.5 tablets (2.5 mg total) by mouth 2 (two) times daily. 12/29/22   Morrie Sheldon, MD  glucose blood (ACCU-CHEK GUIDE) test strip Check blood sugar up to 7 times per week 07/22/22   Belva Agee, MD  Insulin Pen Needle 32G X 4 MM MISC Use as directed with victoza 05/27/22   [name], Vasilios, MD  lisinopril (ZESTRIL) 20 MG tablet Take 1 tablet (20 mg total) by mouth daily. 12/29/22   Morrie Sheldon, MD  loratadine (CLARITIN) 10 MG tablet Take 1 tablet (10 mg total) by mouth daily. 09/19/21 12/18/21  Dellis Filbert, MD  polyethylene glycol (MIRALAX) 17 g packet Take 17 g by mouth daily. 09/05/21   Dellis Filbert, MD  senna-docusate (SENOKOT-S) 8.6-50 MG tablet Take 2 tablets by mouth at bedtime. 05/01/23 05/31/23  Gwenevere Abbot, MD      Allergies    Hydrochlorothiazide    Review of Systems   Review of Systems  Constitutional:  Negative for chills, fatigue and fever.  Respiratory:  Negative for cough, chest tightness, shortness of breath and wheezing.   Cardiovascular:  Negative for chest pain and palpitations.  Gastrointestinal:  Negative for abdominal pain, constipation, diarrhea, nausea and vomiting.  Musculoskeletal:  Positive for joint swelling.  Neurological:  Negative for dizziness, seizures, weakness, light-headedness, numbness and headaches.    Physical Exam Updated Vital Signs BP 127/75   Pulse 72   Temp 98.2 F (36.8 C) (Oral)   Resp 16   Ht 4\' 11"  (1.499 m)   Wt 68 kg   SpO2 98%   BMI 30.30 kg/m  Physical Exam Vitals and nursing note reviewed.  Constitutional:      General: She is not in acute distress.    Appearance: Normal appearance.  HENT:     Head: Normocephalic and atraumatic.  Eyes:     Conjunctiva/sclera: Conjunctivae  normal.  Cardiovascular:     Rate and Rhythm: Normal rate.  Pulmonary:     Effort: Pulmonary effort is normal. No respiratory distress.  Musculoskeletal:     Right lower leg: No edema.     Left lower leg: No edema.     Comments: Able to flex and extend knee 5 out of 5 motor strength but pain behind knee that radiates in calf with ambulation. Mild limp with ambulation due to RLE pain. Mild tenderness to palpation of posterior knee and right calf but no gross swelling nor overlying skin changes to suggest infection noted.  No tenderness to palpation of hips bilaterally, nor thighs bilaterally.  Skin:    Coloration: Skin is not jaundiced or pale.     Comments: No gross swelling, warmth, erythremia, crepitus, obvious deformities noted to knees bilaterally  Neurological:     Mental Status: She is alert and oriented to person, place, and time. Mental status is at baseline.     Cranial Nerves: No cranial nerve deficit.     Sensory: No sensory deficit.     Motor: No weakness.     Coordination: Coordination normal.     Deep Tendon Reflexes: Reflexes normal.     Comments: Able to bear weight on knee and ambulate.  Mild limp due to pain of right knee.     ED Results / Procedures / Treatments   Labs (all labs ordered are listed, but only abnormal results are displayed) Labs Reviewed - No data to display  EKG None  Radiology VAS [place] LOWER EXTREMITY VENOUS (DVT) (ONLY [facility]) Result Date: 05/24/2023  Lower Venous DVT Study Patient Name:  [name]  Date of Exam:   05/24/2023 Medical Rec #: [id number]          Accession #:    [id number] Date of Birth: June 26, 1945          Patient Gender: F Patient Age:   16 years Exam Location:  [facility] Procedure:      VAS [place] LOWER EXTREMITY VENOUS (DVT) Referring Phys: Sabra Heck --------------------------------------------------------------------------------  Indications: Pain.  Limitations: Poor ultrasound/tissue interface. Comparison Study: No  previous exams Performing Technologist: Jody Hill RVT, RDMS  Examination Guidelines: A complete evaluation includes B-mode imaging, spectral Doppler, color Doppler, and power Doppler as needed of all accessible portions of each vessel. Bilateral testing is considered an integral part of a complete examination. Limited examinations for reoccurring indications may be performed as noted. The reflux portion of the exam is performed with the patient in reverse Trendelenburg.  +---------+---------------+---------+-----------+----------+--------------+ RIGHT    CompressibilityPhasicitySpontaneityPropertiesThrombus Aging +---------+---------------+---------+-----------+----------+--------------+ CFV      Full           Yes      Yes                                 +---------+---------------+---------+-----------+----------+--------------+  SFJ      Full                                                        +---------+---------------+---------+-----------+----------+--------------+ FV Prox  Full           Yes      Yes                                 +---------+---------------+---------+-----------+----------+--------------+ FV Mid   Full           Yes      Yes                                 +---------+---------------+---------+-----------+----------+--------------+ FV DistalFull           Yes      Yes                                 +---------+---------------+---------+-----------+----------+--------------+ PFV      Full                                                        +---------+---------------+---------+-----------+----------+--------------+ POP      Full           Yes      Yes                                 +---------+---------------+---------+-----------+----------+--------------+ PTV      Full                                                        +---------+---------------+---------+-----------+----------+--------------+ PERO     Full                                                         +---------+---------------+---------+-----------+----------+--------------+   +----+---------------+---------+-----------+----------+--------------+ LEFTCompressibilityPhasicitySpontaneityPropertiesThrombus Aging +----+---------------+---------+-----------+----------+--------------+ CFV Full           Yes      Yes                                 +----+---------------+---------+-----------+----------+--------------+     Summary: RIGHT: - There is no evidence of deep vein thrombosis in the lower extremity.  - No cystic structure found in the popliteal fossa.  LEFT: - No evidence of common femoral vein obstruction.   *See table(s) above for measurements and observations.    Preliminary    DG Knee Complete 4 Views Right Result Date: 05/24/2023 CLINICAL DATA:  Right knee pain. EXAM: RIGHT  KNEE - COMPLETE 4+ VIEW COMPARISON:  None Available. FINDINGS: No fracture or bone lesion. Knee prosthetic components appear well seated and aligned. No evidence of loosening. Hazy opacity noted in Hoffa's fat pad.  Small effusion. IMPRESSION: 1. No fracture or bone lesion. 2. Knee prosthetic components appear well seated and aligned. 3. Small effusion and evidence of edema in Hoffa's fat pad. Electronically Signed   By: Amie Portland M.D.   On: 05/24/2023 11:51    Procedures Procedures    Medications Ordered in ED Medications  naproxen (NAPROSYN) tablet 500 mg (500 mg Oral Given 05/24/23 1238)    ED Course/ Medical Decision Making/ A&P                                 Medical Decision Making Amount and/or Complexity of Data Reviewed Radiology: ordered.  Risk Prescription drug management.   Patient presents to the ED for concern of right knee pain, this involves an extensive number of treatment options, and is a complaint that carries with it a high risk of complications and morbidity.  The differential diagnosis includes fracture, dislocation,  effusion, DVT, septic joint   Co morbidities that complicate the patient evaluation  See HPI   Additional history obtained:  Additional history obtained from Nursing and Outside Medical Records   External records from outside source obtained and reviewed including  Triage RN note Recent medical evaluations and medication list    Imaging Studies ordered:  I ordered imaging studies including x-ray and DVT ultrasound I independently visualized and interpreted imaging which showed no fracture, DVT but did show small edema I agree with the radiologist interpretation    Medicines ordered and prescription drug management:  I ordered medication including naproxen for pain Reevaluation of the patient after these medicines showed that the patient improved I have reviewed the patients home medicines and have made adjustments as needed     Problem List / ED Course:  Acute right knee pain Low suspicion for septic joint as patient is able to easily flex and extend knee.  There are no overlying skin changes to include warmth, swelling, erythema noted on exam at this time X-ray significant for small effusion and evidence of edema in Hoffman's fat pad As patient complained of mild tenderness palpation of calf and posterior knee, obtained ultrasound which was negative for DVT Provide analgesia in ED which patient reports improvement of pain.  Will provide prescription of naproxen until patient is able to follow-up with Ortho Provided orthopedic follow-up Discussed strict return to emergency department precautions with patient expresses understanding agrees with plan.  Return to emergency department precautions include symptoms of septic joint, new injury, significant worsening of symptoms.  She is agreeable to discharge.  All questions answered to her satisfaction.   Reevaluation:  After the interventions noted above, I reevaluated the patient and found that they have  :improved   Social Determinants of Health:  Provide orthopedic follow-up   Dispostion:  After consideration of the diagnostic results and the patients response to treatment, I feel that the patent would benefit from outpatient management orthopedic follow-up.          Final Clinical Impression(s) / ED Diagnoses Final diagnoses:  Acute pain of right knee    Rx / DC Orders ED Discharge Orders          Ordered    naproxen (NAPROSYN) 375 MG tablet  2 times daily  05/24/23 1604              Judithann Sheen, PA 05/24/23 1610    Arby Barrette, MD 05/31/23 1052

## 2023-05-24 NOTE — Progress Notes (Signed)
RLE venous duplex has been completed.  Preliminary results given to Leane Call, PA-C.    Results can be found under chart review under CV PROC. 05/24/2023 3:46 PM Moneka Mcquinn RVT, RDMS

## 2023-05-24 NOTE — Discharge Instructions (Addendum)
Thank you for letting us evaluate you today.  Your ultrasound was negative for DVT or clot.  Your x-ray was significant for some mild fluid behind your knee which could be due to overuse.  I have provided you with orthopedic follow-up for further management and pain medicine to your pharmacy. Please make sure to rest, ice, and elevate it  Please return to emergency department if you experience red hot swollen joint to indicate infection or significant worsening of pain.  I have also sent you home with some pain management

## 2023-05-24 NOTE — ED Triage Notes (Signed)
Pt c.o right knee pain for a while but worse yesterday. Pt ambulatory. Pt had right knee replacement in 2008

## 2023-05-25 ENCOUNTER — Telehealth: Payer: Self-pay | Admitting: Orthopaedic Surgery

## 2023-05-25 NOTE — Telephone Encounter (Signed)
Left message on patient's voicemail asking her to call to set up surgery date for right carpal tunnel release with Dr Roda Shutters.  Provided name and direct number.

## 2023-06-01 ENCOUNTER — Encounter: Payer: Self-pay | Admitting: Internal Medicine

## 2023-06-01 ENCOUNTER — Ambulatory Visit: Payer: Medicare Other | Admitting: Internal Medicine

## 2023-06-01 VITALS — BP 108/56 | HR 89 | Temp 97.7°F | Ht 59.0 in | Wt 151.3 lb

## 2023-06-01 DIAGNOSIS — K5909 Other constipation: Secondary | ICD-10-CM

## 2023-06-01 DIAGNOSIS — E785 Hyperlipidemia, unspecified: Secondary | ICD-10-CM

## 2023-06-01 DIAGNOSIS — E1159 Type 2 diabetes mellitus with other circulatory complications: Secondary | ICD-10-CM

## 2023-06-01 DIAGNOSIS — Z7984 Long term (current) use of oral hypoglycemic drugs: Secondary | ICD-10-CM | POA: Diagnosis not present

## 2023-06-01 DIAGNOSIS — E1121 Type 2 diabetes mellitus with diabetic nephropathy: Secondary | ICD-10-CM

## 2023-06-01 DIAGNOSIS — K5900 Constipation, unspecified: Secondary | ICD-10-CM

## 2023-06-01 DIAGNOSIS — I152 Hypertension secondary to endocrine disorders: Secondary | ICD-10-CM

## 2023-06-01 DIAGNOSIS — Z7985 Long-term (current) use of injectable non-insulin antidiabetic drugs: Secondary | ICD-10-CM | POA: Diagnosis not present

## 2023-06-01 NOTE — Assessment & Plan Note (Addendum)
Last LDL was 86 on 05/01/2023 with ASCVD of 22%, with goal <70 given reversible ischemia on stress testing. She is prescribed Lipitor and Zetia was initiated last visit. She does not report any side effects or difficulties with the medication.  - Continue Lipitor 40 mg daily - Continue Zetia 10 mg daily

## 2023-06-01 NOTE — Progress Notes (Signed)
The care of the patient was discussed with Dr. Mayford Knife and the assessment and plan was formulated with their assistance.  Please see their note for official documentation of the patient encounter.   Subjective:   Patient ID: [name] female   DOB: Jun 15, 1945 78 y.o.   MRN: [id number]  HPI: [name] is a 78 y.o. female presenting for follow up of chronic constipation. She was seen last in December 2024 due to chronic constipation and abdominal pain and was started on Senokot 2 tablets nightly and MiraLAX.   Past Medical History:  Diagnosis Date   Allergy    Anemia    Bilateral carpal tunnel syndrome 06/29/2018   Breast CA (HCC)    (Rt) breast ca dx 4/12---  S/P RADICAL MASTECTOMY AND CHEMORADIATION   Diabetes mellitus ORAL MED   Fatty liver disease, nonalcoholic    History of hyperkalemia    Hx antineoplastic chemo  10/12 - 06/24/11   PACLITAX EL COMPLETED 06/24/11   Hyperlipidemia    Hypertension    Mild nonproliferative diabetic retinopathy of right eye (HCC) 03/19/2011   Dr. Dede Query Poudyal   Numbness and tingling    Hx: of in fingers and toes since chemotherapy   OA (osteoarthritis) of knee    RIGHT LEG   Obesity    Personal history of chemotherapy    Personal history of radiation therapy    Renal lesion    Status post total left knee replacement 07/30/2020   Current Outpatient Medications  Medication Sig Dispense Refill   Accu-Chek Softclix Lancets lancets Check blood sugar up to 7 times per week 100 each 3   atorvastatin (LIPITOR) 40 MG tablet Take 1 tablet (40 mg total) by mouth daily. 90 tablet 1   chlorthalidone (HYGROTON) 25 MG tablet Take 1 tablet (25 mg total) by mouth daily. 90 tablet 1   cholecalciferol (VITAMIN D3) 25 MCG (1000 UNIT) tablet Take 2 tablets (2,000 Units total) by mouth daily. 60 tablet 2   cyclobenzaprine (FLEXERIL) 10 MG tablet Take 1 tablet (10 mg total) by mouth 2 (two) times daily as needed for muscle spasms. 20 tablet 0    Dulaglutide (TRULICITY) 1.5 MG/0.5ML SOAJ Inject 1.5 mg into the skin once a week. 4 mL 2   empagliflozin (JARDIANCE) 10 MG TABS tablet Take 1 tablet (10 mg total) by mouth daily before breakfast. 90 tablet 2   ezetimibe (ZETIA) 10 MG tablet Take 1 tablet (10 mg total) by mouth daily. 90 tablet 3   fluticasone (FLONASE) 50 MCG/ACT nasal spray Place 1 spray into both nostrils daily. 18.2 mL 2   glipiZIDE (GLUCOTROL) 5 MG tablet Take 0.5 tablets (2.5 mg total) by mouth 2 (two) times daily. 90 tablet 2   glucose blood (ACCU-CHEK GUIDE) test strip Check blood sugar up to 7 times per week 100 each 3   lisinopril (ZESTRIL) 20 MG tablet Take 1 tablet (20 mg total) by mouth daily. 90 tablet 2   loratadine (CLARITIN) 10 MG tablet Take 1 tablet (10 mg total) by mouth daily. 90 tablet 0   naproxen (NAPROSYN) 375 MG tablet Take 1 tablet (375 mg total) by mouth 2 (two) times daily. 20 tablet 0   polyethylene glycol (MIRALAX) 17 g packet Take 17 g by mouth daily. 14 each 0   No current facility-administered medications for this visit.   Family History  Problem Relation Age of Onset   Stroke Father    Breast cancer Sister  Cancer Sister        breast   Cancer Sister        COLON CANCER   Stroke Sister    Heart disease Brother    Social History   Socioeconomic History   Marital status: Married    Spouse name: Not on file   Number of children: 4   Years of education: 16   Highest education level: Not on file  Occupational History   Not on file  Tobacco Use   Smoking status: Never   Smokeless tobacco: Never  Vaping Use   Vaping status: Never Used  Substance and Sexual Activity   Alcohol use: No    Alcohol/week: 0.0 standard drinks of alcohol   Drug use: No   Sexual activity: Not on file    Comment: MENARCHE AGE 80, G4, P4, PARITY AGE 33, HRT 20+ YEARS    Other Topics Concern   Not on file  Social History Narrative   Current Social History 01/14/2021        Patient lives with  spouse in a home is 2 stories. There are 2 steps up to the entrance the patient uses with railings.      Patient's method of transportation is personal car.      The highest level of education was high school diploma.      The patient currently retired.      Identified important Relationships are "my family"      Pets : 1       Interests / Fun: "[employer]"       Current Stressors: "bills"      Religious / Personal Beliefs: Baptist       Social Drivers of Health   Financial Resource Strain: Low Risk  (06/01/2023)   Overall Financial Resource Strain (CARDIA)    Difficulty of Paying Living Expenses: Not very hard  Food Insecurity: No Food Insecurity (06/01/2023)   Hunger Vital Sign    Worried About Running Out of Food in the Last Year: Never true    Ran Out of Food in the Last Year: Never true  Transportation Needs: No Transportation Needs (06/01/2023)   PRAPARE - Administrator, Civil Service (Medical): No    Lack of Transportation (Non-Medical): No  Physical Activity: Insufficiently Active (06/01/2023)   Exercise Vital Sign    Days of Exercise per Week: 1 day    Minutes of Exercise per Session: 30 min  Stress: No Stress Concern Present (06/01/2023)   [employer] of Occupational Health - Occupational Stress Questionnaire    Feeling of Stress : Not at all  Social Connections: Socially Integrated (06/01/2023)   Social Connection and Isolation Panel [NHANES]    Frequency of Communication with Friends and Family: Twice a week    Frequency of Social Gatherings with Friends and Family: Twice a week    Attends Religious Services: More than 4 times per year    Active Member of [employer] or Organizations: Not on file    Attends Banker Meetings: 1 to 4 times per year    Marital Status: Married   Review of Systems: Pertinent items noted in HPI and remainder of comprehensive ROS otherwise negative.  Objective:  Physical Exam: Vitals:   06/01/23 0953  BP: (!)  108/56  Pulse: 89  Temp: 97.7 F (36.5 C)  TempSrc: Oral  SpO2: 100%  Weight: 151 lb 4.8 oz (68.6 kg)  Height: 4\' 11"  (1.499 m)   BP Marland Kitchen)  108/56 (BP Location: Left Arm, Patient Position: Sitting, Cuff Size: Normal)   Pulse 89   Temp 97.7 F (36.5 C) (Oral)   Ht 4\' 11"  (1.499 m)   Wt 151 lb 4.8 oz (68.6 kg)   SpO2 100%   BMI 30.56 kg/m   Physical Exam: Constitutional: well-appearing, sitting comfortably in chair, in no acute distress HENT: normocephalic atraumatic, mucous membranes moist Eyes: conjunctiva non-erythematous Cardiovascular: regular rate and rhythm, no m/r/g Pulmonary/Chest: normal work of breathing on room air, lungs clear to auscultation bilaterally MSK: normal bulk and tone Neurological: alert & oriented x 3, normal gait, no focal deficits Skin: warm and dry Psych: Mood and affect normal     Assessment & Plan:  Constipation History of chronic constipation with abdominal pain. Was put on an regimen of Sennakot-s 2 tabs nightly and Miralax daily at last visit, but she has not been taking the Miralax. She reports that she is more regular on the Sennakot-s but that her stools are still hard. Was counseled about the softening benefits of Miralax and reinforced the aim for one soft bowel movement per day.   [name] also has a history of thrombosis of the ovarian vein for which [facility] has been discussed. Will see if constipation resolution further improves abdominal pain before pursing.  - Continue Sennakot-s 2 tabs nightly - Start Miralax daily  Hypertension associated with diabetes (HCC) Pt's BP 108/56 today, which patient attributes to not hydrating well this morning. She is currently prescribed lisinopril and chlorthalidone and she is not experiencing side effects of either of these mediations. Given asymptomatic lower blood pressure, consider reducing chlorthalidone dose to 12.5 mg next visit.  - Continue lisinopril 20 mg daily - Continue chlorthalidone 25 mg  daily, consider reducing next visit  Hyperlipidemia Last LDL was 86 on 05/01/2023 with ASCVD of 22%, with goal >70 given reversible ischemia on stress testing. She is prescribed Lipitor and Zetia was initiated last visit. She does not report any side effects or difficulties with the medication.  - Continue Lipitor 40 mg daily - Continue Zetia 10 mg daily  Type 2 diabetes mellitus with renal manifestations (HCC) Last A1c on 05/01/2023 was 7%, well-controlled with Glipizide 5 mg daily, Jardiance 10 mg daily, and Trulicity 1.5 mg weekly. Of note, she has been taking glipizide for over a decade with no adverse effects, but has now aged into a population with higher risk of hypoglycemia. Consider discontinuing this higher risk medication and next visit.    Thea Alken, MS3

## 2023-06-01 NOTE — Assessment & Plan Note (Addendum)
Pt's BP 108/56 today. She denies symptoms including dizziness with standing. She is currently prescribed lisinopril and chlorthalidone and she is not experiencing side effects of either of these mediations. Given asymptomatic lower blood pressure, could consider reducing chlorthalidone dose to 12.5 mg next visit.  - Continue lisinopril 20 mg daily - Continue chlorthalidone 25 mg daily, consider reducing next visit

## 2023-06-01 NOTE — Patient Instructions (Signed)
Thank you, [name] for allowing [place] to provide your care today.  For your stomach pain, please pick up Miralax. You can get it over the counter and it usually comes in a powder. Take this every day to soften your stools and keep yourself regular. It may take a few days to notice an effect. Continue aiming for 1 soft bowel movement a day.   Follow up: 3 months   We look forward to seeing you next time. Please call our clinic at [phone] if you have any questions or concerns. The best time to call is Monday-Friday from 9am-4pm, but there is someone available 24/7. If after hours or the weekend, call the main hospital number and ask for the Internal Medicine Resident On-Call. If you need medication refills, please notify your pharmacy one week in advance and they will send [place] a request.   Thank you for trusting me with your care. Wishing you the best!   Thea Alken, MS3

## 2023-06-01 NOTE — Assessment & Plan Note (Addendum)
History of chronic constipation with abdominal pain. Was put on an regimen of Sennakot-s 2 tabs nightly and Miralax daily at last visit, but she has not been taking the Miralax. She reports that she is more regular on the Sennakot-s but that her stools are still hard. Was counseled about the softening benefits of Miralax and reinforced the aim for one soft bowel movement per day.   [name] also has a history of thrombosis of the ovarian vein for which [facility] has been discussed. Will see if constipation resolution further improves abdominal pain before pursing.  - Continue Sennakot-s 2 tabs nightly - Start Miralax daily

## 2023-06-01 NOTE — Assessment & Plan Note (Addendum)
Last A1c on 05/01/2023 was 7%, well-controlled with Glipizide 5 mg daily, Jardiance 10 mg daily, and Trulicity 1.5 mg weekly. Of note, she has been taking glipizide for over a decade with no adverse effects, but has now aged into a population with higher risk of hypoglycemia. Consider discontinuing this higher risk medication and next visit.  P: Pen needles removed from med list

## 2023-06-01 NOTE — Progress Notes (Deleted)
Subjective:  CC: abdominal pain  HPI:  [name] is a 78 y.o. female with a past medical history stated of chronic constipation, diabetes, HTN, HLD, history of breast cancer s/p mastectomy who presents for follow-up on constipation. She was seen last in December 2024 due to her chronic constipation and abdominal pain.  She was started on Senokot 2 tablets nightly with MiraLAX.    Please see problem based assessment and plan for additional details.  Past Medical History:  Diagnosis Date   Allergy    Anemia    Bilateral carpal tunnel syndrome 06/29/2018   Breast CA (HCC)    (Rt) breast ca dx 4/12---  S/P RADICAL MASTECTOMY AND CHEMORADIATION   Diabetes mellitus ORAL MED   Fatty liver disease, nonalcoholic    History of hyperkalemia    Hx antineoplastic chemo  10/12 - 06/24/11   PACLITAX EL COMPLETED 06/24/11   Hyperlipidemia    Hypertension    Mild nonproliferative diabetic retinopathy of right eye (HCC) 03/19/2011   Dr. Dede Query Poudyal   Numbness and tingling    Hx: of in fingers and toes since chemotherapy   OA (osteoarthritis) of knee    RIGHT LEG   Obesity    Personal history of chemotherapy    Personal history of radiation therapy    Renal lesion    Status post total left knee replacement 07/30/2020    Current Outpatient Medications on File Prior to Visit  Medication Sig Dispense Refill   Accu-Chek Softclix Lancets lancets Check blood sugar up to 7 times per week 100 each 3   atorvastatin (LIPITOR) 40 MG tablet Take 1 tablet (40 mg total) by mouth daily. 90 tablet 1   chlorthalidone (HYGROTON) 25 MG tablet Take 1 tablet (25 mg total) by mouth daily. 90 tablet 1   cholecalciferol (VITAMIN D3) 25 MCG (1000 UNIT) tablet Take 2 tablets (2,000 Units total) by mouth daily. 60 tablet 2   cyclobenzaprine (FLEXERIL) 10 MG tablet Take 1 tablet (10 mg total) by mouth 2 (two) times daily as needed for muscle spasms. 20 tablet 0   Dulaglutide (TRULICITY) 1.5 MG/0.5ML  SOAJ Inject 1.5 mg into the skin once a week. 4 mL 2   empagliflozin (JARDIANCE) 10 MG TABS tablet Take 1 tablet (10 mg total) by mouth daily before breakfast. 90 tablet 2   ezetimibe (ZETIA) 10 MG tablet Take 1 tablet (10 mg total) by mouth daily. 90 tablet 3   fluticasone (FLONASE) 50 MCG/ACT nasal spray Place 1 spray into both nostrils daily. 18.2 mL 2   glipiZIDE (GLUCOTROL) 5 MG tablet Take 0.5 tablets (2.5 mg total) by mouth 2 (two) times daily. 90 tablet 2   glucose blood (ACCU-CHEK GUIDE) test strip Check blood sugar up to 7 times per week 100 each 3   Insulin Pen Needle 32G X 4 MM MISC Use as directed with victoza 100 each 3   lisinopril (ZESTRIL) 20 MG tablet Take 1 tablet (20 mg total) by mouth daily. 90 tablet 2   loratadine (CLARITIN) 10 MG tablet Take 1 tablet (10 mg total) by mouth daily. 90 tablet 0   naproxen (NAPROSYN) 375 MG tablet Take 1 tablet (375 mg total) by mouth 2 (two) times daily. 20 tablet 0   polyethylene glycol (MIRALAX) 17 g packet Take 17 g by mouth daily. 14 each 0   No current facility-administered medications on file prior to visit.    Family History  Problem Relation Age of Onset  Stroke Father    Breast cancer Sister    Cancer Sister        breast   Cancer Sister        COLON CANCER   Stroke Sister    Heart disease Brother     Social History   Socioeconomic History   Marital status: Married    Spouse name: Not on file   Number of children: 4   Years of education: 16   Highest education level: Not on file  Occupational History   Not on file  Tobacco Use   Smoking status: Never   Smokeless tobacco: Never  Vaping Use   Vaping status: Never Used  Substance and Sexual Activity   Alcohol use: No    Alcohol/week: 0.0 standard drinks of alcohol   Drug use: No   Sexual activity: Not on file    Comment: MENARCHE AGE 50, G4, P4, PARITY AGE 89, HRT 20+ YEARS    Other Topics Concern   Not on file  Social History Narrative   Current Social  History 01/14/2021        Patient lives with spouse in a home is 2 stories. There are 2 steps up to the entrance the patient uses with railings.      Patient's method of transportation is personal car.      The highest level of education was high school diploma.      The patient currently retired.      Identified important Relationships are "my family"      Pets : 1       Interests / Fun: "[employer]"       Current Stressors: "bills"      Religious / Personal Beliefs: Baptist       Social Drivers of Health   Financial Resource Strain: Low Risk  (05/28/2022)   Overall Financial Resource Strain (CARDIA)    Difficulty of Paying Living Expenses: Not very hard  Food Insecurity: No Food Insecurity (05/28/2022)   Hunger Vital Sign    Worried About Running Out of Food in the Last Year: Never true    Ran Out of Food in the Last Year: Never true  Transportation Needs: No Transportation Needs (05/28/2022)   PRAPARE - Administrator, Civil Service (Medical): No    Lack of Transportation (Non-Medical): No  Physical Activity: Insufficiently Active (05/28/2022)   Exercise Vital Sign    Days of Exercise per Week: 1 day    Minutes of Exercise per Session: 30 min  Stress: No Stress Concern Present (05/28/2022)   [employer] of Occupational Health - Occupational Stress Questionnaire    Feeling of Stress : Not at all  Social Connections: Socially Integrated (05/28/2022)   Social Connection and Isolation Panel [NHANES]    Frequency of Communication with Friends and Family: Twice a week    Frequency of Social Gatherings with Friends and Family: Twice a week    Attends Religious Services: More than 4 times per year    Active Member of [employer] or Organizations: Yes    Attends Engineer, structural: More than 4 times per year    Marital Status: Married  Catering manager Violence: Not At Risk (05/28/2022)   Humiliation, Afraid, Rape, and Kick questionnaire    Fear of Current or  Ex-Partner: No    Emotionally Abused: No    Physically Abused: No    Sexually Abused: No    Review of Systems: ROS negative except for  what is noted on the assessment and plan.  Objective:  There were no vitals filed for this visit.  Physical Exam: Constitutional: well-appearing *** sitting in ***, in no acute distress HENT: normocephalic atraumatic, mucous membranes moist Eyes: conjunctiva non-erythematous Neck: supple Cardiovascular: regular rate and rhythm, no m/r/g Pulmonary/Chest: normal work of breathing on room air, lungs clear to auscultation bilaterally Abdominal: soft, non-tender, non-distended MSK: normal bulk and tone Neurological: alert & oriented x 3, 5/5 strength in bilateral upper and lower extremities, normal gait Skin: warm and dry Psych: ***     Assessment & Plan:  No problem-specific Assessment & Plan notes found for this encounter.    Patient {[id number]::"discussed with","seen with"} Dr. {[id number]::"[id number]","M. Hoffman","Mullen","Narendra","Machen","Vincent","Guilloud","Lau"}   [employer], D.O. [facility] Internal Medicine  PGY-3 Pager: [phone]  Phone: [phone] Date 06/01/2023  Time 7:43 AM

## 2023-06-04 ENCOUNTER — Encounter (HOSPITAL_COMMUNITY): Payer: Self-pay

## 2023-06-04 ENCOUNTER — Ambulatory Visit (INDEPENDENT_AMBULATORY_CARE_PROVIDER_SITE_OTHER): Payer: Medicare Other

## 2023-06-04 ENCOUNTER — Ambulatory Visit (HOSPITAL_COMMUNITY)
Admission: EM | Admit: 2023-06-04 | Discharge: 2023-06-04 | Disposition: A | Discharge status: Home / Self Care | Payer: Medicare Other | Attending: Family Medicine | Admitting: Family Medicine

## 2023-06-04 DIAGNOSIS — M25511 Pain in right shoulder: Secondary | ICD-10-CM | POA: Diagnosis not present

## 2023-06-04 DIAGNOSIS — M25521 Pain in right elbow: Secondary | ICD-10-CM

## 2023-06-04 DIAGNOSIS — M19021 Primary osteoarthritis, right elbow: Secondary | ICD-10-CM | POA: Diagnosis not present

## 2023-06-04 NOTE — ED Triage Notes (Signed)
Patient was walking in her house and the light was off. She fell and landed on the right arm this past Monday. Having pain and bruising in the upper right arm.

## 2023-06-04 NOTE — Discharge Instructions (Addendum)
You were seen today for arm pain after a fall.  Your xray appears normal today.  If the radiologist reads this differently we will call to notify you.  In the mean time I recommend you use tylenol for pain, as well as heat/ice.  Please return if not improving or worsening.

## 2023-06-04 NOTE — ED Provider Notes (Signed)
[facility]    CSN: [id number] Arrival date & time: 06/04/23  1210      History   Chief Complaint Chief Complaint  Patient presents with   Fall   Arm Pain    HPI [name] is a 78 y.o. female.    Fall  Arm Pain  Patient is here for right arm pain after a fall 4 days ago.  She was in her house, tripped over something, and fell on her right side, on a bent elbow.  She had pain right afterward, but the pain has increased, now with bruising and a lump.  She is not taking anything for pain.  She is scheduled for carpal tunnel surgery next week and wants to make sure the arm is okay otherwise.        Past Medical History:  Diagnosis Date   Allergy    Anemia    Bilateral carpal tunnel syndrome 06/29/2018   Breast CA (HCC)    (Rt) breast ca dx 4/12---  S/P RADICAL MASTECTOMY AND CHEMORADIATION   Diabetes mellitus ORAL MED   Fatty liver disease, nonalcoholic    History of hyperkalemia    Hx antineoplastic chemo  10/12 - 06/24/11   PACLITAX EL COMPLETED 06/24/11   Hyperlipidemia    Hypertension    Mild nonproliferative diabetic retinopathy of right eye (HCC) 03/19/2011   Dr. 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   Primary osteoarthritis of left knee 01/25/2019   Cervical neuropathy 08/20/2018   Constipation 01/15/2016   Low back pain 12/20/2014   Osteopenia 12/13/2013   Vaginal discharge 12/13/2013   Diabetic autonomic neuropathy associated with type 2 diabetes mellitus (HCC) 11/02/2013   Health care maintenance 11/17/2012   Breast cancer, right breast (HCC) 09/25/2010   History of colonic polyps  07/19/2007   OBESITY NOS 07/09/2006   Type 2 diabetes mellitus with renal manifestations (HCC) 03/10/2006   Hyperlipidemia 03/10/2006   Hypertension associated with diabetes (HCC) 03/10/2006    Past Surgical History:  Procedure Laterality Date   COLONOSCOPY W/ POLYPECTOMY     Hx; of   I & D KNEE WITH POLY EXCHANGE Right 09/03/2012   Procedure: IRRIGATION AND DEBRIDEMENT RIGHT KNEE WITH POLY EXCHANGE;  Surgeon: [name], MD;  Location: [facility];  Service: Orthopedics;  Laterality: Right;   JOINT REPLACEMENT     knee arthroscopy  10/28/1999   RIGHT   Laparoscopy with laparoscopic right salpingo oophorectomy and lysis of pelvic and abdominal adhesions  07/2011   Dr. Jennette Kettle    LEFT TOTAL KNEE ARTHROPLASTY (Left Knee)  07/30/2020   MASTECTOMY MODIFIED RADICAL Left 12/04/2010   W/ LEFT PAC PLACEMENT (RIGHT BREAST W/ AXILLARY CONTENTS/ NODE BX'S   PORT-A-CATH REMOVAL  09/24/2011   Procedure: REMOVAL PORT-A-CATH;  Surgeon: Clovis Pu. Cornett, MD;  Location: [facility];  Service: General;  Laterality: N/A;   PORTACATH PLACEMENT     REPLACED PAC DUE TO MALFUNCTION (LEFT)   TOTAL KNEE ARTHROPLASTY Right 08/16/2012   Procedure: TOTAL KNEE ARTHROPLASTY;  Surgeon: [name], MD;  Location: [facility];  Service: Orthopedics;  Laterality: Right;   TOTAL KNEE ARTHROPLASTY Left 07/30/2020   Procedure: LEFT TOTAL KNEE ARTHROPLASTY;  Surgeon: Tarry Kos, MD;  Location: [facility];  Service: Orthopedics;  Laterality: Left;   TRANSTHORACIC ECHOCARDIOGRAM  10/29/2010   NORMAL LVF/ EF 55-60%/ MILD MV REGURG   TUBAL LIGATION  YRS AGO   VAGINAL HYSTERECTOMY  AGE 52   W/ LSO    OB History     Gravida  4   Para  4   Term  4   Preterm      AB      Living  4      SAB      IAB      Ectopic      Multiple      Live Births               Home Medications    Prior to Admission medications   Medication Sig Start Date End Date Taking? Authorizing Provider  Accu-Chek Softclix Lancets lancets  Check blood sugar up to 7 times per week 07/22/22  Yes [name], Vasilios, MD  chlorthalidone (HYGROTON) 25 MG tablet Take 1 tablet (25 mg total) by mouth daily. 12/29/22  Yes Morrie Sheldon, MD  cholecalciferol (VITAMIN D3) 25 MCG (1000 UNIT) tablet Take 2 tablets (2,000 Units total) by mouth daily. 05/02/23 07/31/23 Yes Gwenevere Abbot, MD  cyclobenzaprine (FLEXERIL) 10 MG tablet Take 1 tablet (10 mg total) by mouth 2 (two) times daily as needed for muscle spasms. 01/16/23  Yes Rondel Baton, MD  Dulaglutide (TRULICITY) 1.5 MG/0.5ML SOAJ Inject 1.5 mg into the skin once a week. 12/29/22  Yes Morrie Sheldon, MD  empagliflozin (JARDIANCE) 10 MG TABS tablet Take 1 tablet (10 mg total) by mouth daily before breakfast. 12/29/22  Yes Morrie Sheldon, MD  ezetimibe (ZETIA) 10 MG tablet Take 1 tablet (10 mg total) by mouth daily. 05/02/23 05/01/24 Yes Gwenevere Abbot, MD  fluticasone [facility]) 50 MCG/ACT nasal spray Place 1 spray into both nostrils daily. 12/11/21  Yes Atway, Rayann N, DO  glipiZIDE (GLUCOTROL) 5 MG tablet Take 0.5 tablets (2.5 mg total) by mouth 2 (two) times daily. 12/29/22  Yes Morrie Sheldon, MD  glucose blood (ACCU-CHEK GUIDE) test strip Check blood sugar up to 7 times per week 07/22/22  Yes [name], Vasilios, MD  lisinopril (ZESTRIL) 20 MG tablet Take 1 tablet (20 mg total) by mouth daily. 12/29/22  Yes Morrie Sheldon, MD  naproxen (NAPROSYN) 375 MG tablet Take 1 tablet (375 mg total) by mouth 2 (two) times daily. 05/24/23  Yes Judithann Sheen, PA  polyethylene glycol (MIRALAX) 17 g packet Take 17 g by mouth daily. 09/05/21  Yes Dellis Filbert, MD  atorvastatin (LIPITOR) 40 MG tablet Take 1 tablet (40 mg total) by mouth daily. 12/29/22 03/29/23  Morrie Sheldon, MD  loratadine (CLARITIN) 10 MG tablet Take 1 tablet (10 mg total) by mouth daily. 09/19/21 12/18/21  Dellis Filbert, MD    Family History Family History  Problem Relation Age of Onset   Stroke Father    Breast cancer Sister     Cancer Sister        breast   Cancer Sister        COLON CANCER   Stroke Sister    Heart disease Brother     Social History Social History   Tobacco Use   Smoking status: Never   Smokeless tobacco: Never  Vaping Use   Vaping  status: Never Used  Substance Use Topics   Alcohol use: No    Alcohol/week: 0.0 standard drinks of alcohol   Drug use: No     Allergies   Hydrochlorothiazide   Review of Systems Review of Systems  Constitutional: Negative.   HENT: Negative.    Respiratory: Negative.    Cardiovascular: Negative.   Gastrointestinal: Negative.   Genitourinary: Negative.   Psychiatric/Behavioral: Negative.       Physical Exam Triage Vital Signs ED Triage Vitals  Encounter Vitals Group     BP 06/04/23 1404 135/70     Systolic BP Percentile --      Diastolic BP Percentile --      Pulse Rate 06/04/23 1404 80     Resp 06/04/23 1404 18     Temp 06/04/23 1404 98 F (36.7 C)     Temp Source 06/04/23 1404 Oral     SpO2 06/04/23 1404 98 %     Weight --      Height --      Head Circumference --      Peak Flow --      Pain Score 06/04/23 1403 4     Pain Loc --      Pain Education --      Exclude from Growth Chart --    No data found.  Updated Vital Signs BP 135/70 (BP Location: Left Wrist)   Pulse 80   Temp 98 F (36.7 C) (Oral)   Resp 18   SpO2 98%   Visual Acuity Right Eye Distance:   Left Eye Distance:   Bilateral Distance:    Right Eye Near:   Left Eye Near:    Bilateral Near:     Physical Exam Constitutional:      Appearance: Normal appearance. She is normal weight.  Musculoskeletal:     Comments: No TTP to the hand/wrist/forearm.  TTP to the right elbow, humerus, and shoulder;  full rom of the right shoulder and elbow, but pain with movement.   Skin:    Comments: Area of ecchymosis to the right arm, just proximal to the lateral elbow  Neurological:     General: No focal deficit present.     Mental Status: She is alert.   Psychiatric:        Mood and Affect: Mood normal.      UC Treatments / Results  Labs (all labs ordered are listed, but only abnormal results are displayed) Labs Reviewed - No data to display  EKG   Radiology DG Elbow Complete Right Result Date: 06/04/2023 CLINICAL DATA:  Pain after a fall EXAM: RIGHT ELBOW - COMPLETE 3+ VIEW COMPARISON:  None Available. FINDINGS: No acute fracture or dislocation. No joint effusion. Mild degenerative changes about the coronoid process of the ulna. IMPRESSION: No acute osseous abnormality. Electronically Signed   By: Jeronimo Greaves M.D.   On: 06/04/2023 15:00    Procedures Procedures (including critical care time)  Medications Ordered in UC Medications - No data to display  Initial Impression / Assessment and Plan / UC Course  I have reviewed the triage vital signs and the nursing notes.  Pertinent labs & imaging results that were available during my care of the patient were reviewed by me and considered in my medical decision making (see chart for details).   Final Clinical Impressions(s) / UC Diagnoses   Final diagnoses:  Arthralgia of right upper arm     Discharge Instructions      You  were seen today for arm pain after a fall.  Your xray appears normal today.  If the radiologist reads this differently we will call to notify you.  In the mean time I recommend you use tylenol for pain, as well as heat/ice.  Please return if not improving or worsening.     ED Prescriptions   None    PDMP not reviewed this encounter.   Jannifer Franklin, MD 06/04/23 [phone]

## 2023-06-08 ENCOUNTER — Other Ambulatory Visit: Payer: Self-pay | Admitting: Physician Assistant

## 2023-06-08 MED ORDER — HYDROCODONE-ACETAMINOPHEN 5-325 MG PO TABS: 1.0000 | ORAL_TABLET | Freq: Three times a day (TID) | ORAL | 0 refills | Status: DC | PRN

## 2023-06-08 MED ORDER — ONDANSETRON HCL 4 MG PO TABS: 4.0000 mg | ORAL_TABLET | Freq: Three times a day (TID) | ORAL | 0 refills | Status: AC | PRN

## 2023-06-08 NOTE — Progress Notes (Signed)
Internal Medicine Clinic Attending  Case discussed with the student and supervising resident at the time of the visit.  We reviewed the supervised student's history and exam and pertinent patient test results.  I agree with the assessment, diagnosis, and plan of care documented in the note.

## 2023-06-09 ENCOUNTER — Other Ambulatory Visit: Payer: Self-pay | Admitting: Student

## 2023-06-09 DIAGNOSIS — Z0000 Encounter for general adult medical examination without abnormal findings: Secondary | ICD-10-CM

## 2023-06-11 DIAGNOSIS — G5601 Carpal tunnel syndrome, right upper limb: Secondary | ICD-10-CM | POA: Diagnosis not present

## 2023-06-19 ENCOUNTER — Encounter: Payer: Self-pay | Admitting: Physician Assistant

## 2023-06-19 ENCOUNTER — Ambulatory Visit: Payer: 59 | Admitting: Physician Assistant

## 2023-06-19 DIAGNOSIS — G5601 Carpal tunnel syndrome, right upper limb: Secondary | ICD-10-CM

## 2023-06-19 NOTE — Progress Notes (Signed)
Post-Op Visit Note   Patient: [name]           Date of Birth: [birth year]           MRN: [id number] Visit Date: 06/19/2023 PCP: Morrie Sheldon, MD   Assessment & Plan:  Chief Complaint:  Chief Complaint  Patient presents with   Right Hand - Routine Post Op   Visit Diagnoses:  1. Right carpal tunnel syndrome     Plan: Patient is a pleasant 78 year old female who comes in today 1 week status post right carpal tunnel release.  She has been doing well.  She is in very little discomfort and is not taking anything for pain.  She still notes decrease sensation to the index, long, ring and small fingers.  Examination of her right hand reveals a well-healing surgical incision with nylon sutures in place.  No evidence of infection or cellulitis.  Fingers are warm well-perfused.  She does have decree sensation throughout the median nerve distribution.  Today, her wound was cleaned and recovered.  Velcro splint applied.  She will wear this at all times with the exception of hygiene for the next week.  She may begin nerve gliding exercises.  No heavy lifting or submerging her hand underwater for another 3 weeks.  Follow-up next week for suture removal.  Call with concerns or questions.  Follow-Up Instructions: Return in about 1 week (around 06/26/2023).   Orders:  No orders of the defined types were placed in this encounter.  No orders of the defined types were placed in this encounter.   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   Primary osteoarthritis of left knee 01/25/2019   Cervical neuropathy 08/20/2018   Constipation 01/15/2016   Low back pain 12/20/2014   Osteopenia 12/13/2013   Vaginal discharge 12/13/2013   Diabetic autonomic neuropathy associated with type 2 diabetes mellitus (HCC) 11/02/2013   Health care maintenance 11/17/2012   Breast cancer, right breast (HCC) 09/25/2010    History of colonic polyps 07/19/2007   OBESITY NOS 07/09/2006   Type 2 diabetes mellitus with renal manifestations (HCC) 03/10/2006   Hyperlipidemia 03/10/2006   Hypertension associated with diabetes (HCC) 03/10/2006   Past Medical History:  Diagnosis Date   Allergy    Anemia    Bilateral carpal tunnel syndrome 06/29/2018   Breast CA (HCC)    (Rt) breast ca dx 4/12---  S/P RADICAL MASTECTOMY AND CHEMORADIATION   Diabetes mellitus ORAL MED   Fatty liver disease, nonalcoholic    History of hyperkalemia    Hx antineoplastic chemo  10/12 - 06/24/11   PACLITAX EL COMPLETED 06/24/11   Hyperlipidemia    Hypertension    Mild nonproliferative diabetic retinopathy of right eye (HCC) 03/19/2011   Dr. Dede Query Poudyal   Numbness and tingling    Hx: of in fingers and toes since chemotherapy   OA (osteoarthritis) of knee    RIGHT LEG   Obesity    Personal history of chemotherapy    Personal history of radiation therapy    Renal lesion    Status post total left knee replacement 07/30/2020    Family History  Problem Relation Age of Onset   Stroke Father    Breast cancer Sister    Cancer Sister        breast   Cancer Sister        COLON CANCER  Stroke Sister    Heart disease Brother     Past Surgical History:  Procedure Laterality Date   COLONOSCOPY W/ POLYPECTOMY     Hx; of   I & D KNEE WITH POLY EXCHANGE Right 09/03/2012   Procedure: IRRIGATION AND DEBRIDEMENT RIGHT KNEE WITH POLY EXCHANGE;  Surgeon: [name], MD;  Location: [facility];  Service: Orthopedics;  Laterality: Right;   JOINT REPLACEMENT     knee arthroscopy  10/28/1999   RIGHT   Laparoscopy with laparoscopic right salpingo oophorectomy and lysis of pelvic and abdominal adhesions  07/2011   Dr. Jennette Kettle    LEFT TOTAL KNEE ARTHROPLASTY (Left Knee)  07/30/2020   MASTECTOMY MODIFIED RADICAL Left 12/04/2010   W/ LEFT PAC PLACEMENT (RIGHT BREAST W/ AXILLARY CONTENTS/ NODE BX'S   PORT-A-CATH REMOVAL  09/24/2011    Procedure: REMOVAL PORT-A-CATH;  Surgeon: Clovis Pu. Cornett, MD;  Location: [facility];  Service: General;  Laterality: N/A;   PORTACATH PLACEMENT     REPLACED PAC DUE TO MALFUNCTION (LEFT)   TOTAL KNEE ARTHROPLASTY Right 08/16/2012   Procedure: TOTAL KNEE ARTHROPLASTY;  Surgeon: [name], MD;  Location: [facility];  Service: Orthopedics;  Laterality: Right;   TOTAL KNEE ARTHROPLASTY Left 07/30/2020   Procedure: LEFT TOTAL KNEE ARTHROPLASTY;  Surgeon: Tarry Kos, MD;  Location: [facility];  Service: Orthopedics;  Laterality: Left;   TRANSTHORACIC ECHOCARDIOGRAM  10/29/2010   NORMAL LVF/ EF 55-60%/ MILD MV REGURG   TUBAL LIGATION  YRS AGO   VAGINAL HYSTERECTOMY  AGE 7   W/ LSO   Social History   Occupational History   Not on file  Tobacco Use   Smoking status: Never   Smokeless tobacco: Never  Vaping Use   Vaping status: Never Used  Substance and Sexual Activity   Alcohol use: No    Alcohol/week: 0.0 standard drinks of alcohol   Drug use: No   Sexual activity: Not on file    Comment: MENARCHE AGE 68, G4, P4, PARITY AGE 39, HRT 20+ YEARS

## 2023-06-21 ENCOUNTER — Other Ambulatory Visit: Payer: Self-pay | Admitting: Student

## 2023-06-21 DIAGNOSIS — E1159 Type 2 diabetes mellitus with other circulatory complications: Secondary | ICD-10-CM

## 2023-06-23 ENCOUNTER — Other Ambulatory Visit: Payer: Self-pay | Admitting: Internal Medicine

## 2023-06-23 DIAGNOSIS — M8580 Other specified disorders of bone density and structure, unspecified site: Secondary | ICD-10-CM

## 2023-06-24 ENCOUNTER — Other Ambulatory Visit: Payer: Self-pay | Admitting: Student

## 2023-06-24 DIAGNOSIS — E785 Hyperlipidemia, unspecified: Secondary | ICD-10-CM

## 2023-06-24 NOTE — Telephone Encounter (Signed)
 Medication sent to pharmacy

## 2023-06-26 ENCOUNTER — Ambulatory Visit: Payer: 59 | Admitting: Physician Assistant

## 2023-06-26 ENCOUNTER — Encounter: Payer: Self-pay | Admitting: Orthopaedic Surgery

## 2023-06-26 DIAGNOSIS — G5601 Carpal tunnel syndrome, right upper limb: Secondary | ICD-10-CM

## 2023-06-26 DIAGNOSIS — Z9889 Other specified postprocedural states: Secondary | ICD-10-CM

## 2023-06-26 NOTE — Progress Notes (Signed)
Post-Op Visit Note   Patient: [name]           Date of Birth: August 05, 1945           MRN: [id number] Visit Date: 06/26/2023 PCP: Morrie Sheldon, MD   Assessment & Plan:  Chief Complaint:  Chief Complaint  Patient presents with   Right Wrist - Follow-up    Right carpal tunnel release 06/11/2023   Visit Diagnoses:  1. Right carpal tunnel syndrome   2. S/P carpal tunnel release     Plan: Patient is a pleasant 78 year old female who comes in today 2 weeks status post right carpal tunnel release 06/11/2023.  She has been doing well.  Minimal discomfort.  She still has slight paresthesias to the tip of the long finger.  She has been compliant wearing her splint.  Examination of the right hand reveals a well-healing surgical incision with nylon sutures in place.  No evidence of infection or cellulitis.  Fingers are warm and well-perfused.  Decree sensation to the long fingertip.  Today, sutures were removed and Steri-Strips applied.  No heavy lifting or submerging her hand underwater for the next 2 weeks.  She will continue with range of motion exercises.  Follow-up in 2 weeks for recheck.  Call with concerns or questions.  Follow-Up Instructions: Return in about 2 weeks (around 07/10/2023).   Orders:  No orders of the defined types were placed in this encounter.  No orders of the defined types were placed in this encounter.   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   Primary osteoarthritis of left knee 01/25/2019   Cervical neuropathy 08/20/2018   Constipation 01/15/2016   Low back pain 12/20/2014   Osteopenia 12/13/2013   Vaginal discharge 12/13/2013   Diabetic autonomic neuropathy associated with type 2 diabetes mellitus (HCC) 11/02/2013   Health care maintenance 11/17/2012   Breast cancer, right breast (HCC) 09/25/2010   History of colonic polyps 07/19/2007   OBESITY NOS  07/09/2006   Type 2 diabetes mellitus with renal manifestations (HCC) 03/10/2006   Hyperlipidemia 03/10/2006   Hypertension associated with diabetes (HCC) 03/10/2006   Past Medical History:  Diagnosis Date   Allergy    Anemia    Bilateral carpal tunnel syndrome 06/29/2018   Breast CA (HCC)    (Rt) breast ca dx 4/12---  S/P RADICAL MASTECTOMY AND CHEMORADIATION   Diabetes mellitus ORAL MED   Fatty liver disease, nonalcoholic    History of hyperkalemia    Hx antineoplastic chemo  10/12 - 06/24/11   PACLITAX EL COMPLETED 06/24/11   Hyperlipidemia    Hypertension    Mild nonproliferative diabetic retinopathy of right eye (HCC) 03/19/2011   Dr. Dede Query Poudyal   Numbness and tingling    Hx: of in fingers and toes since chemotherapy   OA (osteoarthritis) of knee    RIGHT LEG   Obesity    Personal history of chemotherapy    Personal history of radiation therapy    Renal lesion    Status post total left knee replacement 07/30/2020    Family History  Problem Relation Age of Onset   Stroke Father    Breast cancer Sister    Cancer Sister        breast   Cancer Sister        COLON CANCER   Stroke Sister    Heart disease Brother  Past Surgical History:  Procedure Laterality Date   COLONOSCOPY W/ POLYPECTOMY     Hx; of   I & D KNEE WITH POLY EXCHANGE Right 09/03/2012   Procedure: IRRIGATION AND DEBRIDEMENT RIGHT KNEE WITH POLY EXCHANGE;  Surgeon: [name], MD;  Location: [facility];  Service: Orthopedics;  Laterality: Right;   JOINT REPLACEMENT     knee arthroscopy  10/28/1999   RIGHT   Laparoscopy with laparoscopic right salpingo oophorectomy and lysis of pelvic and abdominal adhesions  07/2011   Dr. Jennette Kettle    LEFT TOTAL KNEE ARTHROPLASTY (Left Knee)  07/30/2020   MASTECTOMY MODIFIED RADICAL Left 12/04/2010   W/ LEFT PAC PLACEMENT (RIGHT BREAST W/ AXILLARY CONTENTS/ NODE BX'S   PORT-A-CATH REMOVAL  09/24/2011   Procedure: REMOVAL PORT-A-CATH;  Surgeon: Clovis Pu. Cornett, MD;   Location: [facility];  Service: General;  Laterality: N/A;   PORTACATH PLACEMENT     REPLACED PAC DUE TO MALFUNCTION (LEFT)   TOTAL KNEE ARTHROPLASTY Right 08/16/2012   Procedure: TOTAL KNEE ARTHROPLASTY;  Surgeon: [name], MD;  Location: [facility];  Service: Orthopedics;  Laterality: Right;   TOTAL KNEE ARTHROPLASTY Left 07/30/2020   Procedure: LEFT TOTAL KNEE ARTHROPLASTY;  Surgeon: Tarry Kos, MD;  Location: [facility];  Service: Orthopedics;  Laterality: Left;   TRANSTHORACIC ECHOCARDIOGRAM  10/29/2010   NORMAL LVF/ EF 55-60%/ MILD MV REGURG   TUBAL LIGATION  YRS AGO   VAGINAL HYSTERECTOMY  AGE 36   W/ LSO   Social History   Occupational History   Not on file  Tobacco Use   Smoking status: Never   Smokeless tobacco: Never  Vaping Use   Vaping status: Never Used  Substance and Sexual Activity   Alcohol use: No    Alcohol/week: 0.0 standard drinks of alcohol   Drug use: No   Sexual activity: Not on file    Comment: MENARCHE AGE 81, G4, P4, PARITY AGE 34, HRT 20+ YEARS

## 2023-06-29 ENCOUNTER — Encounter: Payer: Medicare Other | Admitting: Student

## 2023-07-03 ENCOUNTER — Ambulatory Visit: Payer: 59 | Admitting: Student

## 2023-07-03 VITALS — BP 103/58 | HR 84 | Temp 97.9°F | Ht 59.0 in | Wt 148.2 lb

## 2023-07-03 DIAGNOSIS — Z7985 Long-term (current) use of injectable non-insulin antidiabetic drugs: Secondary | ICD-10-CM

## 2023-07-03 DIAGNOSIS — Z7984 Long term (current) use of oral hypoglycemic drugs: Secondary | ICD-10-CM | POA: Diagnosis not present

## 2023-07-03 DIAGNOSIS — E1159 Type 2 diabetes mellitus with other circulatory complications: Secondary | ICD-10-CM | POA: Diagnosis not present

## 2023-07-03 DIAGNOSIS — I152 Hypertension secondary to endocrine disorders: Secondary | ICD-10-CM

## 2023-07-03 DIAGNOSIS — E1121 Type 2 diabetes mellitus with diabetic nephropathy: Secondary | ICD-10-CM

## 2023-07-03 NOTE — Progress Notes (Signed)
 CC: Follow-up  HPI:  [name] is a 78 y.o. female living with a history stated below and presents today for follow-up. Please see problem based assessment and plan for additional details.  Past Medical History:  Diagnosis Date   Allergy    Anemia    Bilateral carpal tunnel syndrome 06/29/2018   Breast CA (HCC)    (Rt) breast ca dx 4/12---  S/P RADICAL MASTECTOMY AND CHEMORADIATION   Diabetes mellitus ORAL MED   Fatty liver disease, nonalcoholic    History of hyperkalemia    Hx antineoplastic chemo  10/12 - 06/24/11   PACLITAX EL COMPLETED 06/24/11   Hyperlipidemia    Hypertension    Mild nonproliferative diabetic retinopathy of right eye (HCC) 03/19/2011   Dr. Dede Query Poudyal   Numbness and tingling    Hx: of in fingers and toes since chemotherapy   OA (osteoarthritis) of knee    RIGHT LEG   Obesity    Personal history of chemotherapy    Personal history of radiation therapy    Renal lesion    Status post total left knee replacement 07/30/2020    Current Outpatient Medications on File Prior to Visit  Medication Sig Dispense Refill   Accu-Chek Softclix Lancets lancets Check blood sugar up to 7 times per week 100 each 3   atorvastatin (LIPITOR) 40 MG tablet TAKE 1 TABLET BY MOUTH EVERY DAY 90 tablet 1   cholecalciferol (VITAMIN D3) 25 MCG (1000 UNIT) tablet TAKE 2 TABLETS (2,000 UNITS TOTAL) BY MOUTH DAILY 100 tablet 1   cyclobenzaprine (FLEXERIL) 10 MG tablet Take 1 tablet (10 mg total) by mouth 2 (two) times daily as needed for muscle spasms. 20 tablet 0   Dulaglutide (TRULICITY) 1.5 MG/0.5ML SOAJ Inject 1.5 mg into the skin once a week. 4 mL 2   empagliflozin (JARDIANCE) 10 MG TABS tablet Take 1 tablet (10 mg total) by mouth daily before breakfast. 90 tablet 2   ezetimibe (ZETIA) 10 MG tablet Take 1 tablet (10 mg total) by mouth daily. 90 tablet 3   fluticasone (FLONASE) 50 MCG/ACT nasal spray Place 1 spray into both nostrils daily. 18.2 mL 2   glucose blood  (ACCU-CHEK GUIDE) test strip Check blood sugar up to 7 times per week 100 each 3   HYDROcodone-acetaminophen (NORCO) 5-325 MG tablet Take 1 tablet by mouth 3 (three) times daily as needed. To be taken after surgery 20 tablet 0   lisinopril (ZESTRIL) 20 MG tablet Take 1 tablet (20 mg total) by mouth daily. 90 tablet 2   loratadine (CLARITIN) 10 MG tablet Take 1 tablet (10 mg total) by mouth daily. 90 tablet 0   naproxen (NAPROSYN) 375 MG tablet Take 1 tablet (375 mg total) by mouth 2 (two) times daily. 20 tablet 0   ondansetron (ZOFRAN) 4 MG tablet Take 1 tablet (4 mg total) by mouth every 8 (eight) hours as needed for nausea or vomiting. 40 tablet 0   polyethylene glycol (MIRALAX) 17 g packet Take 17 g by mouth daily. 14 each 0   No current facility-administered medications on file prior to visit.    Family History  Problem Relation Age of Onset   Stroke Father    Breast cancer Sister    Cancer Sister        breast   Cancer Sister        COLON CANCER   Stroke Sister    Heart disease Brother     Social History  Socioeconomic History   Marital status: Married    Spouse name: Not on file   Number of children: 4   Years of education: 67   Highest education level: Not on file  Occupational History   Not on file  Tobacco Use   Smoking status: Never   Smokeless tobacco: Never  Vaping Use   Vaping status: Never Used  Substance and Sexual Activity   Alcohol use: No    Alcohol/week: 0.0 standard drinks of alcohol   Drug use: No   Sexual activity: Not on file    Comment: MENARCHE AGE 29, G4, P4, PARITY AGE 72, HRT 20+ YEARS    Other Topics Concern   Not on file  Social History Narrative   Current Social History 01/14/2021        Patient lives with spouse in a home is 2 stories. There are 2 steps up to the entrance the patient uses with railings.      Patient's method of transportation is personal car.      The highest level of education was high school diploma.      The  patient currently retired.      Identified important Relationships are "my family"      Pets : 1       Interests / Fun: "[employer]"       Current Stressors: "bills"      Religious / Personal Beliefs: Baptist       Social Drivers of Health   Financial Resource Strain: Low Risk  (06/01/2023)   Overall Financial Resource Strain (CARDIA)    Difficulty of Paying Living Expenses: Not very hard  Food Insecurity: No Food Insecurity (06/01/2023)   Hunger Vital Sign    Worried About Running Out of Food in the Last Year: Never true    Ran Out of Food in the Last Year: Never true  Transportation Needs: No Transportation Needs (06/01/2023)   PRAPARE - Administrator, Civil Service (Medical): No    Lack of Transportation (Non-Medical): No  Physical Activity: Insufficiently Active (06/01/2023)   Exercise Vital Sign    Days of Exercise per Week: 1 day    Minutes of Exercise per Session: 30 min  Stress: No Stress Concern Present (06/01/2023)   [employer] of Occupational Health - Occupational Stress Questionnaire    Feeling of Stress : Not at all  Social Connections: Socially Integrated (06/01/2023)   Social Connection and Isolation Panel [NHANES]    Frequency of Communication with Friends and Family: Twice a week    Frequency of Social Gatherings with Friends and Family: Twice a week    Attends Religious Services: More than 4 times per year    Active Member of [employer] or Organizations: Not on file    Attends Banker Meetings: 1 to 4 times per year    Marital Status: Married  Catering manager Violence: Not At Risk (06/01/2023)   Humiliation, Afraid, Rape, and Kick questionnaire    Fear of Current or Ex-Partner: No    Emotionally Abused: No    Physically Abused: No    Sexually Abused: No    Review of Systems: ROS negative except for what is noted on the assessment and plan.  Vitals:   07/03/23 0914  BP: (!) 103/58  Pulse: 84  Temp: 97.9 F (36.6 C)  TempSrc:  Oral  SpO2: 100%  Weight: 148 lb 3.2 oz (67.2 kg)  Height: 4\' 11"  (1.499 m)  Physical Exam: Constitutional: well-appearing, sitting in chair, in no acute distress Cardiovascular: regular rate and rhythm, no m/r/g Pulmonary/Chest: normal work of breathing on room air, lungs clear to auscultation bilaterally MSK: normal bulk and tone Skin: warm and dry Psych: normal mood and behavior  Assessment & Plan:     Patient discussed with Dr. Cleda Daub  Hypertension associated with diabetes (HCC) BP 103/58 with similar reading at 1/27 visit which considered d/c one of patient's antihypertensives. She is currently managed with Chlorthalidone 25 and lisinopril 20.  Given recent history of soft blood pressures and age, will discontinue chlorthalidone and follow-up next month.  Type 2 diabetes mellitus with renal manifestations (HCC) A1c has been well-controlled and was 7.0 12/24.  Managed with Trulicity 1.5, Jardiance 10, and glipizide 5.  She does not check her blood sugar that often, but states the lowest she has seen is 70 with no significant highs.  Given associated risk with significant hypoglycemia and the fact that she is well-controlled, will discontinue glipizide.  Reemphasized the importance of diet control especially in the setting of discontinuing glipizide -Follow-up A1c next month   Carmina Miller, D.O. [facility] Internal Medicine, PGY-1 Phone: [phone] Date 07/06/2023 Time 8:59 AM

## 2023-07-03 NOTE — Patient Instructions (Addendum)
 Please STOP taking your chlorthalidone and glipizide.  Follow-up in 1 month.

## 2023-07-06 NOTE — Assessment & Plan Note (Signed)
 BP 103/58 with similar reading at 1/27 visit which considered d/c one of patient's antihypertensives. She is currently managed with Chlorthalidone 25 and lisinopril 20.  Given recent history of soft blood pressures and age, will discontinue chlorthalidone and follow-up next month.

## 2023-07-06 NOTE — Assessment & Plan Note (Addendum)
 A1c has been well-controlled and was 7.0 12/24.  Managed with Trulicity 1.5, Jardiance 10, and glipizide 5.  She does not check her blood sugar that often, but states the lowest she has seen is 70 with no significant highs.  Given associated risk with significant hypoglycemia and the fact that she is well-controlled, will discontinue glipizide.  Reemphasized the importance of diet control especially in the setting of discontinuing glipizide -Follow-up A1c next month

## 2023-07-07 ENCOUNTER — Emergency Department (HOSPITAL_BASED_OUTPATIENT_CLINIC_OR_DEPARTMENT_OTHER): Admitting: Radiology

## 2023-07-07 ENCOUNTER — Other Ambulatory Visit: Payer: Self-pay | Admitting: Physician Assistant

## 2023-07-07 ENCOUNTER — Encounter (HOSPITAL_BASED_OUTPATIENT_CLINIC_OR_DEPARTMENT_OTHER): Payer: Self-pay | Admitting: Emergency Medicine

## 2023-07-07 ENCOUNTER — Other Ambulatory Visit (HOSPITAL_COMMUNITY): Payer: Self-pay

## 2023-07-07 ENCOUNTER — Ambulatory Visit
Admission: RE | Admit: 2023-07-07 | Discharge: 2023-07-07 | Disposition: A | Discharge status: Home / Self Care | Payer: Medicare Other | Source: Ambulatory Visit | Attending: Internal Medicine | Admitting: Internal Medicine

## 2023-07-07 ENCOUNTER — Emergency Department (HOSPITAL_BASED_OUTPATIENT_CLINIC_OR_DEPARTMENT_OTHER)
Admission: EM | Admit: 2023-07-07 | Discharge: 2023-07-07 | Disposition: A | Discharge status: Home / Self Care | Attending: Emergency Medicine | Admitting: Emergency Medicine

## 2023-07-07 ENCOUNTER — Other Ambulatory Visit: Payer: Self-pay | Admitting: Student

## 2023-07-07 ENCOUNTER — Other Ambulatory Visit: Payer: Self-pay

## 2023-07-07 DIAGNOSIS — M79601 Pain in right arm: Secondary | ICD-10-CM | POA: Diagnosis not present

## 2023-07-07 DIAGNOSIS — E119 Type 2 diabetes mellitus without complications: Secondary | ICD-10-CM | POA: Diagnosis not present

## 2023-07-07 DIAGNOSIS — M25511 Pain in right shoulder: Secondary | ICD-10-CM | POA: Diagnosis not present

## 2023-07-07 DIAGNOSIS — E1121 Type 2 diabetes mellitus with diabetic nephropathy: Secondary | ICD-10-CM

## 2023-07-07 DIAGNOSIS — Z0000 Encounter for general adult medical examination without abnormal findings: Secondary | ICD-10-CM

## 2023-07-07 DIAGNOSIS — Z79899 Other long term (current) drug therapy: Secondary | ICD-10-CM | POA: Diagnosis not present

## 2023-07-07 DIAGNOSIS — Z853 Personal history of malignant neoplasm of breast: Secondary | ICD-10-CM | POA: Diagnosis not present

## 2023-07-07 DIAGNOSIS — M542 Cervicalgia: Secondary | ICD-10-CM | POA: Diagnosis present

## 2023-07-07 DIAGNOSIS — I10 Essential (primary) hypertension: Secondary | ICD-10-CM | POA: Diagnosis not present

## 2023-07-07 LAB — BASIC METABOLIC PANEL
Anion gap: 11 (ref 5–15)
BUN: 22 mg/dL (ref 8–23)
CO2: 28 mmol/L (ref 22–32)
Calcium: 9.4 mg/dL (ref 8.9–10.3)
Chloride: 103 mmol/L (ref 98–111)
Creatinine, Ser: 0.88 mg/dL (ref 0.44–1.00)
GFR, Estimated: >60 mL/min (ref >60)
Glucose, Bld: 110 mg/dL — ABNORMAL HIGH (ref 70–99)
Potassium: 3.2 mmol/L — ABNORMAL LOW (ref 3.5–5.1)
Sodium: 142 mmol/L (ref 135–145)

## 2023-07-07 LAB — CBC
HCT: 33 % — ABNORMAL LOW (ref 36.0–46.0)
Hemoglobin: 11.2 g/dL — ABNORMAL LOW (ref 12.0–15.0)
MCH: 32.4 pg (ref 26.0–34.0)
MCHC: 33.9 g/dL (ref 30.0–36.0)
MCV: 95.4 fL (ref 80.0–100.0)
Platelets: 169 K/uL (ref 150–400)
RBC: 3.46 MIL/uL — ABNORMAL LOW (ref 3.87–5.11)
RDW: 15.3 % (ref 11.5–15.5)
WBC: 4.7 K/uL (ref 4.0–10.5)
nRBC: 0 % (ref 0.0–0.2)

## 2023-07-07 LAB — TROPONIN I (HIGH SENSITIVITY): Troponin I (High Sensitivity): 4 ng/L

## 2023-07-07 MED ORDER — TRULICITY 1.5 MG/0.5ML ~~LOC~~ SOAJ
1.5000 mg | SUBCUTANEOUS | 2 refills | Status: DC
  Filled 2023-07-07: qty 2, 28d supply, fill #0

## 2023-07-07 MED ORDER — TIZANIDINE HCL 4 MG PO TABS: 4.0000 mg | ORAL_TABLET | Freq: Four times a day (QID) | ORAL | 0 refills | Status: DC | PRN

## 2023-07-07 MED ORDER — NAPROXEN 500 MG PO TABS: 500.0000 mg | ORAL_TABLET | Freq: Two times a day (BID) | ORAL | 0 refills | Status: DC

## 2023-07-07 MED ORDER — POTASSIUM CHLORIDE CRYS ER 20 MEQ PO TBCR
40.0000 meq | EXTENDED_RELEASE_TABLET | Freq: Once | ORAL | Status: AC
  Administered 2023-07-07: 40 meq via ORAL
  Filled 2023-07-07: qty 2

## 2023-07-07 NOTE — ED Triage Notes (Signed)
 Right sided neck pain into right arm Started this AM Denies son, no n/v

## 2023-07-07 NOTE — ED Notes (Signed)
 Blood work obtained by Engineer, materials.Marland KitchenMarland Kitchen

## 2023-07-07 NOTE — ED Notes (Signed)
 Discharge paperwork given and verbally understood.

## 2023-07-07 NOTE — ED Provider Triage Note (Addendum)
 Emergency Medicine Provider Triage Evaluation Note  [name] , a 78 y.o. female  was evaluated in triage.  Pt complains of right neck pain, chest pain. Reports pain began this morning. Endorsing some pain to posterior cervical spine. Denies any injuries recently or heavy lifting. Denies N/V/D. Denies SOB, LH, dizziness, weakness. Hx of arthralgia of right upper arm, seen in January.   Review of Systems  Positive:  Negative:   Physical Exam  BP 115/74 (BP Location: Right Arm)   Pulse 76   Temp 98 F (36.7 C) (Oral)   Resp 18   SpO2 100%  Gen:   Awake, no distress   Resp:  Normal effort  MSK:   Moves extremities without difficulty  Other:    Medical Decision Making  Medically screening exam initiated at 1:03 PM.  Appropriate orders placed.  [name] was informed that the remainder of the evaluation will be completed by another provider, this initial triage assessment does not replace that evaluation, and the importance of remaining in the ED until their evaluation is complete.       Al Decant, PA-C 07/07/23 1305

## 2023-07-07 NOTE — ED Provider Notes (Signed)
 [place] EMERGENCY DEPARTMENT AT [facility] Provider Note   CSN: [id number] Arrival date & time: 07/07/23  1203     History  Chief Complaint  Patient presents with   Neck Pain    [name] is a 78 y.o. female with medical history of diabetes, nonalcoholic fatty liver disease, hyperkalemia, hypertension, osteoarthritis of knee, history of breast cancer in remission, bilateral carpal tunnel syndrome.  Patient presents to ED for evaluation of right-sided shoulder pain, neck pain.  Patient reports pain began this morning.  She is endorsing some pain to her posterior cervical spine.  She denies any injuries recently or heavy lifting.  Reports that 1 month ago she did have a ground-level fall with resulting right arm pain, went to urgent care.  She is denying any shortness of breath, nausea, vomiting, diarrhea, lightheadedness, dizziness, weakness, leg swelling.  Reports pain is worse with movement, better with rest.   Neck Pain      Home Medications Prior to Admission medications   Medication Sig Start Date End Date Taking? Authorizing Provider  naproxen (NAPROSYN) 500 MG tablet Take 1 tablet (500 mg total) by mouth 2 (two) times daily. 07/07/23  Yes Al Decant, PA-C  tiZANidine (ZANAFLEX) 4 MG tablet Take 1 tablet (4 mg total) by mouth every 6 (six) hours as needed for muscle spasms. 07/07/23  Yes Al Decant, PA-C  Accu-Chek Softclix Lancets lancets Check blood sugar up to 7 times per week 07/22/22   Belva Agee, MD  atorvastatin (LIPITOR) 40 MG tablet TAKE 1 TABLET BY MOUTH EVERY DAY 06/24/23   Morrie Sheldon, MD  cholecalciferol (VITAMIN D3) 25 MCG (1000 UNIT) tablet TAKE 2 TABLETS (2,000 UNITS TOTAL) BY MOUTH DAILY 06/28/23   Morrie Sheldon, MD  cyclobenzaprine (FLEXERIL) 10 MG tablet Take 1 tablet (10 mg total) by mouth 2 (two) times daily as needed for muscle spasms. 01/16/23   Rondel Baton, MD  Dulaglutide (TRULICITY) 1.5 MG/0.5ML SOAJ  Inject 1.5 mg into the skin once a week. 07/07/23   Morrie Sheldon, MD  empagliflozin (JARDIANCE) 10 MG TABS tablet Take 1 tablet (10 mg total) by mouth daily before breakfast. 12/29/22   Morrie Sheldon, MD  ezetimibe (ZETIA) 10 MG tablet Take 1 tablet (10 mg total) by mouth daily. 05/02/23 05/01/24  Gwenevere Abbot, MD  fluticasone [facility]) 50 MCG/ACT nasal spray Place 1 spray into both nostrils daily. 12/11/21   Atway, Rayann N, DO  glucose blood (ACCU-CHEK GUIDE) test strip Check blood sugar up to 7 times per week 07/22/22   Belva Agee, MD  HYDROcodone-acetaminophen (NORCO) 5-325 MG tablet Take 1 tablet by mouth 3 (three) times daily as needed. To be taken after surgery 06/08/23   Cristie Hem, PA-C  lisinopril (ZESTRIL) 20 MG tablet Take 1 tablet (20 mg total) by mouth daily. 12/29/22   Morrie Sheldon, MD  loratadine (CLARITIN) 10 MG tablet Take 1 tablet (10 mg total) by mouth daily. 09/19/21 12/18/21  Dellis Filbert, MD  ondansetron (ZOFRAN) 4 MG tablet Take 1 tablet (4 mg total) by mouth every 8 (eight) hours as needed for nausea or vomiting. 06/08/23   Cristie Hem, PA-C  polyethylene glycol (MIRALAX) 17 g packet Take 17 g by mouth daily. 09/05/21   Dellis Filbert, MD      Allergies    Hydrochlorothiazide    Review of Systems   Review of Systems  Respiratory:  Negative for shortness of breath.   Gastrointestinal:  Negative for abdominal pain,  nausea and vomiting.  Musculoskeletal:  Positive for arthralgias, myalgias and neck pain.  All other systems reviewed and are negative.   Physical Exam Updated Vital Signs BP (!) 152/76   Pulse 72   Temp 98 F (36.7 C) (Oral)   Resp (!) 24   SpO2 100%  Physical Exam Vitals and nursing note reviewed.  Constitutional:      General: She is not in acute distress.    Appearance: She is well-developed.  HENT:     Head: Normocephalic and atraumatic.  Eyes:     Conjunctiva/sclera: Conjunctivae normal.  Neck:   Cardiovascular:      Rate and Rhythm: Normal rate and regular rhythm.     Heart sounds: No murmur heard. Pulmonary:     Effort: Pulmonary effort is normal. No respiratory distress.     Breath sounds: Normal breath sounds.     Comments: Pain to palpation of right chest wall Chest:     Chest wall: Tenderness present.  Abdominal:     Palpations: Abdomen is soft.     Tenderness: There is no abdominal tenderness.  Musculoskeletal:        General: Tenderness present. No swelling.     Cervical back: Neck supple.     Right lower leg: No edema.     Left lower leg: No edema.     Comments: Tenderness to right shoulder.  Pain worse with crossarm abduction.  Skin:    General: Skin is warm and dry.     Capillary Refill: Capillary refill takes less than 2 seconds.  Neurological:     Mental Status: She is alert.  Psychiatric:        Mood and Affect: Mood normal.     ED Results / Procedures / Treatments   Labs (all labs ordered are listed, but only abnormal results are displayed) Labs Reviewed  BASIC METABOLIC PANEL - Abnormal; Notable for the following components:      Result Value   Potassium 3.2 (*)    Glucose, Bld 110 (*)    All other components within normal limits  CBC - Abnormal; Notable for the following components:   RBC 3.46 (*)    Hemoglobin 11.2 (*)    HCT 33.0 (*)    All other components within normal limits  TROPONIN I (HIGH SENSITIVITY)  TROPONIN I (HIGH SENSITIVITY)    EKG EKG Interpretation Date/Time:  Tuesday July 07 2023 12:59:47 EST Ventricular Rate:  75 PR Interval:  190 QRS Duration:  96 QT Interval:  400 QTC Calculation: 446 R Axis:   -27  Text Interpretation: Unusual P axis, possible ectopic atrial rhythm Nonspecific T wave abnormality Abnormal ECG When compared with ECG of 11-Aug-2019 10:55, Ectopic atrial rhythm has replaced Sinus rhythm Nonspecific T wave abnormality now evident in Lateral leads No significant change since last tracing Confirmed by Margarita Grizzle [phone])  on 07/07/2023 1:06:00 PM  Radiology DG Chest 2 View Result Date: 07/07/2023 CLINICAL DATA:  Chest pain, right neck pain EXAM: CHEST - 2 VIEW COMPARISON:  07/26/2020 FINDINGS: Lungs are clear.  No pneumothorax. Heart size and mediastinal contours are within normal limits. No effusion. Vertebral endplate spurring at multiple levels in the mid and lower thoracic spine. Post right mastectomy. IMPRESSION: No acute cardiopulmonary disease. Electronically Signed   By: Corlis Leak M.D.   On: 07/07/2023 16:10    Procedures Procedures   Medications Ordered in ED Medications  potassium chloride SA (KLOR-CON M) CR tablet 40 mEq (has no administration  in time range)    ED Course/ Medical Decision Making/ A&P  Medical Decision Making Amount and/or Complexity of Data Reviewed Labs: ordered. Radiology: ordered.   88 female presents for evaluation.  Please see HPI for further details.  On examination the patient is afebrile, nontachycardic.  Her lung sounds are clear bilaterally, she is not hypoxic.  Her abdomen is soft and compressible throughout.  No edema to bilateral lower extremities.  Neurological examinations at baseline.  Patient does have tenderness to lateral portion right side of neck.  Pain is worse with crossarm abduction of her right shoulder.  She has tenderness to the right side of her chest wall.  Suspect musculoskeletal as source of pain.  Will assess with cardiac labs.  Patient CBC without leukocytosis, baseline hemoglobin.  Metabolic panel with potassium 3.2 repleted with 40 mEq oral potassium.  The patient troponin is 4, her EKG is not ischemic, her chest x-ray is unremarkable.  The patient pain is worse with movement, better with rest.  I suspect the patient pain is musculoskeletal in nature.  Will send her home with naproxen, low-dose muscle relaxers.  Will have her follow-up with her PCP.  She has had no centralized chest pain, shortness of breath.  I feel ACS is less likely in this  situation.  Will send patient home with naproxen, low-dose muscle relaxer.  Will have patient follow-up with her PCP.  She is stable to discharge home.   Final Clinical Impression(s) / ED Diagnoses Final diagnoses:  Neck pain  Acute pain of right shoulder    Rx / DC Orders ED Discharge Orders          Ordered    tiZANidine (ZANAFLEX) 4 MG tablet  Every 6 hours PRN        07/07/23 1845    naproxen (NAPROSYN) 500 MG tablet  2 times daily        07/07/23 1845              Al Decant, PA-C 07/07/23 1845    Ernie Avena, MD 07/07/23 2034

## 2023-07-07 NOTE — Discharge Instructions (Addendum)
 It was a pleasure taking part in your care.  As discussed, your Is reassuring.  I believe that your neck pain and right shoulder pain is because of musculoskeletal strain.  Please begin taking naproxen twice a day as needed for pain and shoulder and neck.  Please also begin taking muscle relaxer every 6 hours as needed for muscle spasms.  Please follow-up with your PCP.  Please return to the ED with any new or worsening symptoms such as chest pain, shortness of breath.

## 2023-07-07 NOTE — Telephone Encounter (Signed)
 Medication sent to pharmacy

## 2023-07-08 ENCOUNTER — Telehealth: Payer: Self-pay

## 2023-07-08 NOTE — Telephone Encounter (Signed)
 Prior Authorization for patient (Trulicity 1.5MG /0.5ML auto-injectors) came through on cover my meds was submitted with last office notes and labs awaiting approval or denial.  KEY:[id number]

## 2023-07-08 NOTE — Telephone Encounter (Signed)
 [name] (Key: [id number]) PA Case ID #: [id number] Need Help? Call us at [phone] Outcome Approved today by [facility] Medicare 2017 NCPDP Request Reference Number: [id number]. TRULICITY INJ 1.5/0.5 is approved through 05/04/2024. Your patient may now fill this prescription and it will be covered. Effective Date: 07/08/2023 Authorization Expiration Date: 05/04/2024 Drug Trulicity 1.5MG /0.5ML auto-injectors ePA cloud logo Form [employer] Medicare Part D Electronic Prior Authorization Form [phone] NCPDP)

## 2023-07-09 NOTE — Addendum Note (Signed)
 Addended by: Gust Rung on: 07/09/2023 04:34 PM   Modules accepted: Level of Service

## 2023-07-09 NOTE — Progress Notes (Signed)
 Internal Medicine Clinic Attending  Case discussed with the resident at the time of the visit.  We reviewed the resident's history and exam and pertinent patient test results.  I agree with the assessment, diagnosis, and plan of care documented in the resident's note.

## 2023-07-13 ENCOUNTER — Other Ambulatory Visit: Payer: Self-pay | Admitting: Internal Medicine

## 2023-07-13 DIAGNOSIS — R928 Other abnormal and inconclusive findings on diagnostic imaging of breast: Secondary | ICD-10-CM

## 2023-07-14 ENCOUNTER — Ambulatory Visit (INDEPENDENT_AMBULATORY_CARE_PROVIDER_SITE_OTHER): Payer: 59 | Admitting: Physician Assistant

## 2023-07-14 DIAGNOSIS — G5601 Carpal tunnel syndrome, right upper limb: Secondary | ICD-10-CM

## 2023-07-14 DIAGNOSIS — Z9889 Other specified postprocedural states: Secondary | ICD-10-CM

## 2023-07-14 NOTE — Progress Notes (Signed)
 Post-Op Visit Note   Patient: [name]           Date of Birth: [birth year]           MRN: [id number] Visit Date: 07/14/2023 PCP: Morrie Sheldon, MD   Assessment & Plan:  Chief Complaint:  Chief Complaint  Patient presents with   Right Hand - Follow-up   Visit Diagnoses:  1. S/P carpal tunnel release   2. Carpal tunnel syndrome, right upper limb     Plan: Patient is a pleasant 78 year old female who comes in today 1 month status post right carpal tunnel release 06/11/2023.  She has been doing okay.  She still has some soreness over the incision.  She has paresthesias to the index, long and ring fingers.  Overall, doing better.  Examination of the right hand reveals a well-healed surgical incision without complication.  She has decree sensation to the index, long and ring fingers.  Fingers are warm well-perfused.  At this point, she may advance with activity as tolerated.  We have discussed that the paresthesias could take up to a year to dissipate however there is a chance that they may be permanent as she had moderate to severe compression seen on nerve conduction study 5 years ago.  She understands and agrees.  She will follow-up with [place] as needed.  Call with concerns or questions.  Follow-Up Instructions: Return if symptoms worsen or fail to improve.   Orders:  No orders of the defined types were placed in this encounter.  No orders of the defined types were placed in this encounter.   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   Primary osteoarthritis of left knee 01/25/2019   Cervical neuropathy 08/20/2018   Constipation 01/15/2016   Low back pain 12/20/2014   Osteopenia 12/13/2013   Vaginal discharge 12/13/2013   Diabetic autonomic neuropathy associated with type 2 diabetes mellitus (HCC) 11/02/2013   Health care maintenance 11/17/2012   Breast cancer, right breast (HCC)  09/25/2010   History of colonic polyps 07/19/2007   OBESITY NOS 07/09/2006   Type 2 diabetes mellitus with renal manifestations (HCC) 03/10/2006   Hyperlipidemia 03/10/2006   Hypertension associated with diabetes (HCC) 03/10/2006   Past Medical History:  Diagnosis Date   Allergy    Anemia    Bilateral carpal tunnel syndrome 06/29/2018   Breast CA (HCC)    (Rt) breast ca dx 4/12---  S/P RADICAL MASTECTOMY AND CHEMORADIATION   Diabetes mellitus ORAL MED   Fatty liver disease, nonalcoholic    History of hyperkalemia    Hx antineoplastic chemo  10/12 - 06/24/11   PACLITAX EL COMPLETED 06/24/11   Hyperlipidemia    Hypertension    Mild nonproliferative diabetic retinopathy of right eye (HCC) 03/19/2011   Dr. Dede Query Poudyal   Numbness and tingling    Hx: of in fingers and toes since chemotherapy   OA (osteoarthritis) of knee    RIGHT LEG   Obesity    Personal history of chemotherapy    Personal history of radiation therapy    Renal lesion    Status post total left knee replacement 07/30/2020    Family History  Problem Relation Age of Onset   Stroke Father    Breast cancer Sister    Cancer Sister        breast   Cancer Sister        COLON  CANCER   Stroke Sister    Heart disease Brother     Past Surgical History:  Procedure Laterality Date   CARPAL TUNNEL RELEASE  06/11/2023   COLONOSCOPY W/ POLYPECTOMY     Hx; of   I & D KNEE WITH POLY EXCHANGE Right 09/03/2012   Procedure: IRRIGATION AND DEBRIDEMENT RIGHT KNEE WITH POLY EXCHANGE;  Surgeon: [name], MD;  Location: [facility] OR;  Service: Orthopedics;  Laterality: Right;   JOINT REPLACEMENT     knee arthroscopy  10/28/1999   RIGHT   Laparoscopy with laparoscopic right salpingo oophorectomy and lysis of pelvic and abdominal adhesions  07/2011   Dr. Jennette Kettle    LEFT TOTAL KNEE ARTHROPLASTY (Left Knee)  07/30/2020   MASTECTOMY MODIFIED RADICAL Left 12/04/2010   W/ LEFT PAC PLACEMENT (RIGHT BREAST W/ AXILLARY CONTENTS/ NODE BX'S    PORT-A-CATH REMOVAL  09/24/2011   Procedure: REMOVAL PORT-A-CATH;  Surgeon: Clovis Pu. Cornett, MD;  Location: [facility];  Service: General;  Laterality: N/A;   PORTACATH PLACEMENT     REPLACED PAC DUE TO MALFUNCTION (LEFT)   TOTAL KNEE ARTHROPLASTY Right 08/16/2012   Procedure: TOTAL KNEE ARTHROPLASTY;  Surgeon: [name], MD;  Location: [facility] OR;  Service: Orthopedics;  Laterality: Right;   TOTAL KNEE ARTHROPLASTY Left 07/30/2020   Procedure: LEFT TOTAL KNEE ARTHROPLASTY;  Surgeon: Tarry Kos, MD;  Location: [facility];  Service: Orthopedics;  Laterality: Left;   TRANSTHORACIC ECHOCARDIOGRAM  10/29/2010   NORMAL LVF/ EF 55-60%/ MILD MV REGURG   TUBAL LIGATION  YRS AGO   VAGINAL HYSTERECTOMY  AGE 84   W/ LSO   Social History   Occupational History   Not on file  Tobacco Use   Smoking status: Never   Smokeless tobacco: Never  Vaping Use   Vaping status: Never Used  Substance and Sexual Activity   Alcohol use: No    Alcohol/week: 0.0 standard drinks of alcohol   Drug use: No   Sexual activity: Not on file    Comment: MENARCHE AGE 66, G4, P4, PARITY AGE 70, HRT 20+ YEARS

## 2023-07-16 ENCOUNTER — Ambulatory Visit: Admitting: Student

## 2023-07-16 ENCOUNTER — Ambulatory Visit: Payer: Self-pay | Admitting: Student

## 2023-07-16 VITALS — BP 137/68 | HR 76 | Temp 98.4°F | Ht 59.0 in | Wt 152.2 lb

## 2023-07-16 DIAGNOSIS — Z7985 Long-term (current) use of injectable non-insulin antidiabetic drugs: Secondary | ICD-10-CM | POA: Diagnosis not present

## 2023-07-16 DIAGNOSIS — E1143 Type 2 diabetes mellitus with diabetic autonomic (poly)neuropathy: Secondary | ICD-10-CM

## 2023-07-16 DIAGNOSIS — Z7984 Long term (current) use of oral hypoglycemic drugs: Secondary | ICD-10-CM | POA: Diagnosis not present

## 2023-07-16 MED ORDER — DULOXETINE HCL 30 MG PO CPEP: 30.0000 mg | ORAL_CAPSULE | Freq: Every day | ORAL | 2 refills | Status: DC

## 2023-07-16 NOTE — Patient Instructions (Signed)
 Thank you so much for coming to the clinic today!   Start taking Cymbalta 30 mg daily  I will follow up regarding the labs   We will see you back in a couple weeks.   If you have any questions please feel free to the call the clinic at anytime at [phone]. It was a pleasure seeing you!  Best, Dr. Rayvon Char

## 2023-07-16 NOTE — Telephone Encounter (Signed)
  Chief Complaint: pain in feet Symptoms: pain in feet, tingling in left toes, swelling in feet, darkening of feet Frequency: worse since yesterday Pertinent Negatives: Patient denies difficulty walking, sob, chest pain, fever, coolness/blue in color Disposition: [] ED /[] Urgent Care (no appt availability in office) / [x] Appointment(In office/virtual)/ []  [facility]/ [] Home Care/ [] Refused Recommended Disposition /[] [facility]/ []  Follow-up with PCP Additional Notes: Patient reports she has been experiencing discoloration of her feet and tingling in her left toes since she started chemotherapy. Patient reports yesterday she noticed that the tingling in her left toes felt like it "might be moving further up her foot". Patient reports her feet are slightly swollen but that the swelling is not out of the ordinary for her. Per protocol, appt scheduled today 3/13. Patient advised to call back with worsening symptoms. Patient verbalized understanding.     Copied from CRM [id number]. Topic: Clinical - Red Word Triage >> Jul 16, 2023  9:08 AM Dennison Nancy wrote: Red Word that prompted transfer to Nurse Triage: had a house call from [facility] on march 3rd on both feet the skin color is darken and its worse on left foot and there is numbness in left toes which always been there since had chemo therapy and lately the numbness is moving up to foot Reason for Disposition  [1] Tingling in feet AND [2] new or increased  Answer Assessment - Initial Assessment Questions 1. ONSET: "When did the pain start?"      yesterday 2. LOCATION: "Where is the pain located?"      Both, worse in left 3. PAIN: "How bad is the pain?"    (Scale 1-10; or mild, moderate, severe)  - MILD (1-3): doesn't interfere with normal activities.   - MODERATE (4-7): interferes with normal activities (e.g., work or school) or awakens from sleep, limping.   - SEVERE (8-10): excruciating pain, unable to do any  normal activities, unable to walk.      mild 4. WORK OR EXERCISE: "Has there been any recent work or exercise that involved this part of the body?"      chemo 5. CAUSE: "What do you think is causing the foot pain?"     Being in chemo 6. OTHER SYMPTOMS: "Do you have any other symptoms?" (e.g., leg pain, rash, fever, numbness)     Numbness in left toes, discoloration of feet (darkening), intermittent swelling (she states this is common)  Protocols used: Foot Pain-A-AH

## 2023-07-16 NOTE — Progress Notes (Signed)
 CC: Left foot numbness   HPI: [name] is a 78 y.o. female living with a history stated below and presents today for left foot numbness. Please see problem based assessment and plan for additional details.  Past Medical History:  Diagnosis Date   Allergy    Anemia    Bilateral carpal tunnel syndrome 06/29/2018   Breast CA (HCC)    (Rt) breast ca dx 4/12---  S/P RADICAL MASTECTOMY AND CHEMORADIATION   Diabetes mellitus ORAL MED   Fatty liver disease, nonalcoholic    History of hyperkalemia    Hx antineoplastic chemo  10/12 - 06/24/11   PACLITAX EL COMPLETED 06/24/11   Hyperlipidemia    Hypertension    Mild nonproliferative diabetic retinopathy of right eye (HCC) 03/19/2011   Dr. Dede Query Poudyal   Numbness and tingling    Hx: of in fingers and toes since chemotherapy   OA (osteoarthritis) of knee    RIGHT LEG   Obesity    Personal history of chemotherapy    Personal history of radiation therapy    Renal lesion    Status post total left knee replacement 07/30/2020    Current Outpatient Medications on File Prior to Visit  Medication Sig Dispense Refill   Accu-Chek Softclix Lancets lancets Check blood sugar up to 7 times per week 100 each 3   atorvastatin (LIPITOR) 40 MG tablet TAKE 1 TABLET BY MOUTH EVERY DAY 90 tablet 1   cholecalciferol (VITAMIN D3) 25 MCG (1000 UNIT) tablet TAKE 2 TABLETS (2,000 UNITS TOTAL) BY MOUTH DAILY 100 tablet 1   cyclobenzaprine (FLEXERIL) 10 MG tablet Take 1 tablet (10 mg total) by mouth 2 (two) times daily as needed for muscle spasms. 20 tablet 0   Dulaglutide (TRULICITY) 1.5 MG/0.5ML SOAJ Inject 1.5 mg into the skin once a week. 4 mL 2   empagliflozin (JARDIANCE) 10 MG TABS tablet Take 1 tablet (10 mg total) by mouth daily before breakfast. 90 tablet 2   ezetimibe (ZETIA) 10 MG tablet Take 1 tablet (10 mg total) by mouth daily. 90 tablet 3   fluticasone (FLONASE) 50 MCG/ACT nasal spray Place 1 spray into both nostrils daily. 18.2 mL 2    glucose blood (ACCU-CHEK GUIDE) test strip Check blood sugar up to 7 times per week 100 each 3   HYDROcodone-acetaminophen (NORCO) 5-325 MG tablet Take 1 tablet by mouth 3 (three) times daily as needed. To be taken after surgery 20 tablet 0   lisinopril (ZESTRIL) 20 MG tablet Take 1 tablet (20 mg total) by mouth daily. 90 tablet 2   loratadine (CLARITIN) 10 MG tablet Take 1 tablet (10 mg total) by mouth daily. 90 tablet 0   naproxen (NAPROSYN) 500 MG tablet Take 1 tablet (500 mg total) by mouth 2 (two) times daily. 30 tablet 0   ondansetron (ZOFRAN) 4 MG tablet Take 1 tablet (4 mg total) by mouth every 8 (eight) hours as needed for nausea or vomiting. 40 tablet 0   polyethylene glycol (MIRALAX) 17 g packet Take 17 g by mouth daily. 14 each 0   tiZANidine (ZANAFLEX) 4 MG tablet Take 1 tablet (4 mg total) by mouth every 6 (six) hours as needed for muscle spasms. 30 tablet 0   No current facility-administered medications on file prior to visit.    Family History  Problem Relation Age of Onset   Stroke Father    Breast cancer Sister    Cancer Sister        breast  Cancer Sister        COLON CANCER   Stroke Sister    Heart disease Brother     Social History   Socioeconomic History   Marital status: Married    Spouse name: Not on file   Number of children: 4   Years of education: 16   Highest education level: Not on file  Occupational History   Not on file  Tobacco Use   Smoking status: Never   Smokeless tobacco: Never  Vaping Use   Vaping status: Never Used  Substance and Sexual Activity   Alcohol use: No    Alcohol/week: 0.0 standard drinks of alcohol   Drug use: No   Sexual activity: Not on file    Comment: MENARCHE AGE 80, G4, P4, PARITY AGE 74, HRT 20+ YEARS    Other Topics Concern   Not on file  Social History Narrative   Current Social History 01/14/2021        Patient lives with spouse in a home is 2 stories. There are 2 steps up to the entrance the patient uses  with railings.      Patient's method of transportation is personal car.      The highest level of education was high school diploma.      The patient currently retired.      Identified important Relationships are "my family"      Pets : 1       Interests / Fun: "[employer]"       Current Stressors: "bills"      Religious / Personal Beliefs: Baptist       Social Drivers of Health   Financial Resource Strain: Low Risk  (06/01/2023)   Overall Financial Resource Strain (CARDIA)    Difficulty of Paying Living Expenses: Not very hard  Food Insecurity: No Food Insecurity (06/01/2023)   Hunger Vital Sign    Worried About Running Out of Food in the Last Year: Never true    Ran Out of Food in the Last Year: Never true  Transportation Needs: No Transportation Needs (06/01/2023)   PRAPARE - Administrator, Civil Service (Medical): No    Lack of Transportation (Non-Medical): No  Physical Activity: Insufficiently Active (06/01/2023)   Exercise Vital Sign    Days of Exercise per Week: 1 day    Minutes of Exercise per Session: 30 min  Stress: No Stress Concern Present (06/01/2023)   [employer] of Occupational Health - Occupational Stress Questionnaire    Feeling of Stress : Not at all  Social Connections: Socially Integrated (06/01/2023)   Social Connection and Isolation Panel [NHANES]    Frequency of Communication with Friends and Family: Twice a week    Frequency of Social Gatherings with Friends and Family: Twice a week    Attends Religious Services: More than 4 times per year    Active Member of [employer] or Organizations: Not on file    Attends Banker Meetings: 1 to 4 times per year    Marital Status: Married  Catering manager Violence: Not At Risk (06/01/2023)   Humiliation, Afraid, Rape, and Kick questionnaire    Fear of Current or Ex-Partner: No    Emotionally Abused: No    Physically Abused: No    Sexually Abused: No    Review of Systems: ROS negative  except for what is noted on the assessment and plan.  Vitals:   07/16/23 1522  BP: 137/68  Pulse: 76  Temp: 98.4 F (36.9 C)  TempSrc: Oral  SpO2: 100%  Weight: 152 lb 3.2 oz (69 kg)  Height: 4\' 11"  (1.499 m)    Physical Exam: Constitutional: well-appearing in no acute distress HENT: normocephalic atraumatic, mucous membranes moist Eyes: conjunctiva non-erythematous Neck: supple Cardiovascular: regular rate and rhythm, no m/r/g Pulmonary/Chest: normal work of breathing on room air, lungs clear to auscultation bilaterally Abdominal: soft, non-tender, non-distended MSK: normal bulk and tone Neurological: alert & oriented x 3, 5/5 strength in bilateral upper and lower extremities, normal gait Skin: warm and dry Assessment & Plan:   Diabetic autonomic neuropathy associated with type 2 diabetes mellitus (HCC) Presents with progression of her peripheral neuropathy.  Over the past few days, she notes discoloration of her feet and tingling in her left toes.  These signs symptoms started roughly 12 years ago after she received chemotherapy.  However, she feels like the tingling has worsened the past few days as it has moved further up her foot.  On examination, she had 5 out of 5 strength with plantarflexion, dorsiflexion, inversion, and eversion.  Sensation was intact.  Overall, this may be residual numbness from her chemotherapy in the context of progression of peripheral neuropathy from her diabetes.  She has noted success with duloxetine in the past, so we will resume this.  Also follow-up with TSH and B12 for secondary factors. - Begin duloxetine 30 mg daily - Follow-up TSH, B12  Patient seen with Dr. Bary Leriche, MD  [facility] Internal Medicine, PGY-1 Date 07/16/2023 Time 3:49 PM

## 2023-07-16 NOTE — Assessment & Plan Note (Signed)
 Presents with progression of her peripheral neuropathy.  Over the past few days, she notes discoloration of her feet and tingling in her left toes.  These signs symptoms started roughly 12 years ago after she received chemotherapy.  However, she feels like the tingling has worsened the past few days as it has moved further up her foot.  On examination, she had 5 out of 5 strength with plantarflexion, dorsiflexion, inversion, and eversion.  Sensation was intact.  Overall, this may be residual numbness from her chemotherapy in the context of progression of peripheral neuropathy from her diabetes.  She has noted success with duloxetine in the past, so we will resume this.  Also follow-up with TSH and B12 for secondary factors. - Begin duloxetine 30 mg daily - Follow-up TSH, B12

## 2023-07-18 LAB — TSH: TSH: 2.74 u[IU]/mL (ref 0.450–4.500)

## 2023-07-18 LAB — VITAMIN B12: Vitamin B-12: 562 pg/mL (ref 232–1245)

## 2023-07-20 ENCOUNTER — Encounter (HOSPITAL_COMMUNITY): Payer: Self-pay | Admitting: *Deleted

## 2023-07-20 ENCOUNTER — Other Ambulatory Visit (HOSPITAL_COMMUNITY): Payer: Self-pay

## 2023-07-20 ENCOUNTER — Encounter: Payer: Self-pay | Admitting: Student

## 2023-07-20 ENCOUNTER — Ambulatory Visit (HOSPITAL_COMMUNITY)
Admission: EM | Admit: 2023-07-20 | Discharge: 2023-07-20 | Disposition: A | Discharge status: Home / Self Care | Attending: Family Medicine | Admitting: Family Medicine

## 2023-07-20 DIAGNOSIS — K5900 Constipation, unspecified: Secondary | ICD-10-CM | POA: Diagnosis not present

## 2023-07-20 MED ORDER — SENNOSIDES-DOCUSATE SODIUM 8.6-50 MG PO TABS: 1.0000 | ORAL_TABLET | Freq: Every day | ORAL | 0 refills | Status: AC

## 2023-07-20 NOTE — ED Provider Notes (Signed)
 [facility]    CSN: [id number] Arrival date & time: 07/20/23  0805      History   Chief Complaint Chief Complaint  Patient presents with   Constipation    HPI [name] is a 78 y.o. female.   Patient presenting with history of constipation.  Patient states that over the past 2 days her constipation has gotten worse.  Patient dates that she took MiraLAX yesterday and the day before.  Patient states she is not sure how much water she drinks in a day.  Patient states it could be anywhere between 2-6 bottles.  Patient notes that she has not been specifically drinking more water when it comes to taking her MiraLAX.  Patient otherwise has no other concerns.  Patient denies any opioid use at this time.   Constipation   Past Medical History:  Diagnosis Date   Allergy    Anemia    Bilateral carpal tunnel syndrome 06/29/2018   Breast CA (HCC)    (Rt) breast ca dx 4/12---  S/P RADICAL MASTECTOMY AND CHEMORADIATION   Diabetes mellitus ORAL MED   Fatty liver disease, nonalcoholic    History of hyperkalemia    Hx antineoplastic chemo  10/12 - 06/24/11   PACLITAX EL COMPLETED 06/24/11   Hyperlipidemia    Hypertension    Mild nonproliferative diabetic retinopathy of right eye (HCC) 03/19/2011   Dr. 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   Primary osteoarthritis of left knee 01/25/2019   Cervical neuropathy 08/20/2018   Constipation 01/15/2016   Low back pain 12/20/2014   Osteopenia 12/13/2013   Vaginal discharge 12/13/2013   Diabetic autonomic neuropathy associated with type 2 diabetes mellitus (HCC) 11/02/2013    Health care maintenance 11/17/2012   Breast cancer, right breast (HCC) 09/25/2010   History of colonic polyps 07/19/2007   OBESITY NOS 07/09/2006   Type 2 diabetes mellitus with renal manifestations (HCC) 03/10/2006   Hyperlipidemia 03/10/2006   Hypertension associated with diabetes (HCC) 03/10/2006    Past Surgical History:  Procedure Laterality Date   CARPAL TUNNEL RELEASE  06/11/2023   COLONOSCOPY W/ POLYPECTOMY     Hx; of   I & D KNEE WITH POLY EXCHANGE Right 09/03/2012   Procedure: IRRIGATION AND DEBRIDEMENT RIGHT KNEE WITH POLY EXCHANGE;  Surgeon: [name], MD;  Location: [facility];  Service: Orthopedics;  Laterality: Right;   JOINT REPLACEMENT     knee arthroscopy  10/28/1999   RIGHT   Laparoscopy with laparoscopic right salpingo oophorectomy and lysis of pelvic and abdominal adhesions  07/2011   Dr. Jennette Kettle    LEFT TOTAL KNEE ARTHROPLASTY (Left Knee)  07/30/2020   MASTECTOMY MODIFIED RADICAL Left 12/04/2010   W/ LEFT PAC PLACEMENT (RIGHT BREAST W/ AXILLARY CONTENTS/ NODE BX'S   PORT-A-CATH REMOVAL  09/24/2011   Procedure: REMOVAL PORT-A-CATH;  Surgeon: Clovis Pu. Cornett, MD;  Location: [facility];  Service: General;  Laterality: N/A;   PORTACATH PLACEMENT     REPLACED PAC DUE TO MALFUNCTION (LEFT)   TOTAL KNEE ARTHROPLASTY Right 08/16/2012  Procedure: TOTAL KNEE ARTHROPLASTY;  Surgeon: [name], MD;  Location: [facility];  Service: Orthopedics;  Laterality: Right;   TOTAL KNEE ARTHROPLASTY Left 07/30/2020   Procedure: LEFT TOTAL KNEE ARTHROPLASTY;  Surgeon: Tarry Kos, MD;  Location: [facility];  Service: Orthopedics;  Laterality: Left;   TRANSTHORACIC ECHOCARDIOGRAM  10/29/2010   NORMAL LVF/ EF 55-60%/ MILD MV REGURG   TUBAL LIGATION  YRS AGO   VAGINAL HYSTERECTOMY  AGE 44   W/ LSO    OB History     Gravida  4   Para  4   Term  4   Preterm      AB      Living  4      SAB      IAB      Ectopic      Multiple      Live Births               Home  Medications    Prior to Admission medications   Medication Sig Start Date End Date Taking? Authorizing Provider  naproxen (NAPROSYN) 500 MG tablet Take 1 tablet (500 mg total) by mouth 2 (two) times daily. 07/07/23  Yes Al Decant, PA-C  polyethylene glycol (MIRALAX) 17 g packet Take 17 g by mouth daily. 09/05/21  Yes Dellis Filbert, MD  senna-docusate (SENOKOT-S) 8.6-50 MG tablet Take 1 tablet by mouth daily. 07/20/23  Yes Brenton Grills, MD  Accu-Chek Softclix Lancets lancets Check blood sugar up to 7 times per week 07/22/22   Belva Agee, MD  atorvastatin (LIPITOR) 40 MG tablet TAKE 1 TABLET BY MOUTH EVERY DAY 06/24/23   Morrie Sheldon, MD  cholecalciferol (VITAMIN D3) 25 MCG (1000 UNIT) tablet TAKE 2 TABLETS (2,000 UNITS TOTAL) BY MOUTH DAILY Patient not taking: Reported on 07/20/2023 06/28/23   Morrie Sheldon, MD  cyclobenzaprine (FLEXERIL) 10 MG tablet Take 1 tablet (10 mg total) by mouth 2 (two) times daily as needed for muscle spasms. 01/16/23   Rondel Baton, MD  Dulaglutide (TRULICITY) 1.5 MG/0.5ML SOAJ Inject 1.5 mg into the skin once a week. 07/07/23   Morrie Sheldon, MD  DULoxetine (CYMBALTA) 30 MG capsule Take 1 capsule (30 mg total) by mouth daily. 07/16/23   Morrie Sheldon, MD  empagliflozin (JARDIANCE) 10 MG TABS tablet Take 1 tablet (10 mg total) by mouth daily before breakfast. 12/29/22   Morrie Sheldon, MD  ezetimibe (ZETIA) 10 MG tablet Take 1 tablet (10 mg total) by mouth daily. 05/02/23 05/01/24  Gwenevere Abbot, MD  fluticasone [facility]) 50 MCG/ACT nasal spray Place 1 spray into both nostrils daily. 12/11/21   Atway, Rayann N, DO  glucose blood (ACCU-CHEK GUIDE) test strip Check blood sugar up to 7 times per week 07/22/22   Belva Agee, MD  HYDROcodone-acetaminophen (NORCO) 5-325 MG tablet Take 1 tablet by mouth 3 (three) times daily as needed. To be taken after surgery 06/08/23   Cristie Hem, PA-C  lisinopril (ZESTRIL) 20 MG tablet Take 1 tablet (20 mg  total) by mouth daily. 12/29/22   Morrie Sheldon, MD  loratadine (CLARITIN) 10 MG tablet Take 1 tablet (10 mg total) by mouth daily. 09/19/21 12/18/21  Dellis Filbert, MD  ondansetron (ZOFRAN) 4 MG tablet Take 1 tablet (4 mg total) by mouth every 8 (eight) hours as needed for nausea or vomiting. 06/08/23   Cristie Hem, PA-C  tiZANidine (ZANAFLEX) 4 MG tablet Take 1 tablet (4 mg total) by mouth every 6 (six) hours  as needed for muscle spasms. 07/07/23   Al Decant, PA-C    Family History Family History  Problem Relation Age of Onset   Stroke Father    Breast cancer Sister    Cancer Sister        breast   Cancer Sister        COLON CANCER   Stroke Sister    Heart disease Brother     Social History Social History   Tobacco Use   Smoking status: Never   Smokeless tobacco: Never  Vaping Use   Vaping status: Never Used  Substance Use Topics   Alcohol use: No    Alcohol/week: 0.0 standard drinks of alcohol   Drug use: No     Allergies   Hydrochlorothiazide   Review of Systems Review of Systems  Gastrointestinal:  Positive for constipation.     Physical Exam Triage Vital Signs ED Triage Vitals  Encounter Vitals Group     BP 07/20/23 0833 (!) 164/85     Systolic BP Percentile --      Diastolic BP Percentile --      Pulse Rate 07/20/23 0833 75     Resp 07/20/23 0833 18     Temp 07/20/23 0833 98 F (36.7 C)     Temp Source 07/20/23 0833 Oral     SpO2 07/20/23 0833 97 %     Weight --      Height --      Head Circumference --      Peak Flow --      Pain Score 07/20/23 0831 0     Pain Loc --      Pain Education --      Exclude from Growth Chart --    No data found.  Updated Vital Signs BP (!) 164/85 (BP Location: Left Arm)   Pulse 75   Temp 98 F (36.7 C) (Oral)   Resp 18   SpO2 97%   Visual Acuity Right Eye Distance:   Left Eye Distance:   Bilateral Distance:    Right Eye Near:   Left Eye Near:    Bilateral Near:     Physical  Exam Constitutional:      Appearance: Normal appearance.  Cardiovascular:     Rate and Rhythm: Normal rate and regular rhythm.  Abdominal:     General: Abdomen is flat.     Palpations: Abdomen is soft.     Tenderness: There is abdominal tenderness (Generalized).  Neurological:     Mental Status: She is alert.      UC Treatments / Results  Labs (all labs ordered are listed, but only abnormal results are displayed) Labs Reviewed - No data to display  EKG   Radiology No results found.  Procedures Procedures (including critical care time)  Medications Ordered in UC Medications - No data to display  Initial Impression / Assessment and Plan / UC Course  I have reviewed the triage vital signs and the nursing notes.  Pertinent labs & imaging results that were available during my care of the patient were reviewed by me and considered in my medical decision making (see chart for details).     Patient likely dealing with some constipation episode at this time.  Discussed with patient the benefit of taking the MiraLAX with high amounts of fluid.  At this time we will increase MiraLAX to twice a day, patient was advised to take at least 60 ounces of water but can take  up to 80 given no history of heart failure.  Also advised patient to start taking docusate/senna daily.  In the meantime, for the acute period of constipation patient was also advised start taking over-the-counter magnesium citrate.  Did discuss with patient that magnesium citrate over long-term can cause electrolyte imbalances and should only be used for 1 to 2 days.  Patient understanding and agreeable with plan. Final Clinical Impressions(s) / UC Diagnoses   Final diagnoses:  Constipation, unspecified constipation type     Discharge Instructions      It is imperative you drink lots of water if you plan on taking MiraLAX.  I do recommend increasing your MiraLAX intake to twice a day.  I would take at least 60  ounces of water a day.  In the meantime, you can try magnesium citrate which is over-the-counter.  Please follow instructions on the bottle.  Please take this for the next 2 days.  I am also sending you something called docusate senna.  Please take this once a day over the next few days.   Once you feel this constipation episode has relieved.  You may take your MiraLAX once a day as well as a senna once a day going forward.  I would continue to drink high levels of water in order to help with this.  You do not need to take magnesium citrate unless you feel very constipated.    ED Prescriptions     Medication Sig Dispense Auth. Provider   senna-docusate (SENOKOT-S) 8.6-50 MG tablet Take 1 tablet by mouth daily. 30 tablet Brenton Grills, MD      PDMP not reviewed this encounter.   Brenton Grills, MD 07/20/23 [phone]

## 2023-07-20 NOTE — Discharge Instructions (Addendum)
 It is imperative you drink lots of water if you plan on taking MiraLAX.  I do recommend increasing your MiraLAX intake to twice a day.  I would take at least 60 ounces of water a day.  In the meantime, you can try magnesium citrate which is over-the-counter.  Please follow instructions on the bottle.  Please take this for the next 2 days.  I am also sending you something called docusate senna.  Please take this once a day over the next few days.   Once you feel this constipation episode has relieved.  You may take your MiraLAX once a day as well as a senna once a day going forward.  I would continue to drink high levels of water in order to help with this.  You do not need to take magnesium citrate unless you feel very constipated.

## 2023-07-20 NOTE — ED Triage Notes (Signed)
 Pt states she has been constipated X 3 months. She states she has talked about it with her pcp and he reccommended she use miralax daily, but she is taking it as needed. She took some last night. Her last BM was 2 days ago.   She has been off all her regular meds X 1 week she states she has renewals but hasn't picked them up.

## 2023-07-23 ENCOUNTER — Ambulatory Visit
Admission: RE | Admit: 2023-07-23 | Discharge: 2023-07-23 | Disposition: A | Discharge status: Home / Self Care | Source: Ambulatory Visit | Attending: Internal Medicine | Admitting: Internal Medicine

## 2023-07-23 DIAGNOSIS — R928 Other abnormal and inconclusive findings on diagnostic imaging of breast: Secondary | ICD-10-CM

## 2023-07-31 ENCOUNTER — Other Ambulatory Visit (HOSPITAL_COMMUNITY): Payer: Self-pay

## 2023-07-31 ENCOUNTER — Ambulatory Visit: Payer: 59 | Admitting: Student

## 2023-07-31 VITALS — BP 173/80 | HR 74 | Temp 97.6°F | Ht 59.0 in | Wt 152.1 lb

## 2023-07-31 DIAGNOSIS — E1121 Type 2 diabetes mellitus with diabetic nephropathy: Secondary | ICD-10-CM | POA: Diagnosis not present

## 2023-07-31 DIAGNOSIS — I152 Hypertension secondary to endocrine disorders: Secondary | ICD-10-CM

## 2023-07-31 DIAGNOSIS — K5900 Constipation, unspecified: Secondary | ICD-10-CM | POA: Diagnosis not present

## 2023-07-31 DIAGNOSIS — E1159 Type 2 diabetes mellitus with other circulatory complications: Secondary | ICD-10-CM | POA: Diagnosis not present

## 2023-07-31 DIAGNOSIS — Z7984 Long term (current) use of oral hypoglycemic drugs: Secondary | ICD-10-CM

## 2023-07-31 DIAGNOSIS — Z7985 Long-term (current) use of injectable non-insulin antidiabetic drugs: Secondary | ICD-10-CM

## 2023-07-31 LAB — POCT GLYCOSYLATED HEMOGLOBIN (HGB A1C): Hemoglobin A1C: 6.3 % — AB (ref 4.0–5.6)

## 2023-07-31 LAB — GLUCOSE, CAPILLARY: Glucose-Capillary: 106 mg/dL — ABNORMAL HIGH (ref 70–99)

## 2023-07-31 MED ORDER — PSYLLIUM 58.6 % PO PACK
1.0000 | PACK | Freq: Every day | ORAL | 12 refills | Status: AC
  Filled 2023-07-31: qty 30, 30d supply, fill #0

## 2023-07-31 MED ORDER — TRULICITY 1.5 MG/0.5ML ~~LOC~~ SOAJ
1.5000 mg | SUBCUTANEOUS | 2 refills | Status: DC
  Filled 2023-07-31: qty 4, 56d supply, fill #0
  Filled 2023-10-12: qty 4, 56d supply, fill #1
  Filled 2023-12-09: qty 4, 56d supply, fill #2

## 2023-07-31 NOTE — Assessment & Plan Note (Signed)
 Current medications include lisinopril 20 mg daily. Blood pressure in office today is 177/80. Repeat is 173/80. Counseled on importance of recording this at home and logging her readings. She has not taken her medications today. Will have patient return for nurse's visit for repeat check next week. If it is still elevated at this time, will add low-dose amlodipine to her regimen.

## 2023-07-31 NOTE — Assessment & Plan Note (Signed)
 Patient presents for diabetes follow-up.  Current medications include Trulicity 1.5 mg weekly and Jardiance 10 mg daily.  Glipizide 5 mg discontinued at her visit in 06/2023 due to concerns for hypoglycemia.  Last A1c in 04/2023 was 7.0.  Today, A1c is 6.3. No current changes to her regimen.  - Continue Trulicity 1.5 mg weekly and Jardiance 10 mg daily - Follow-up A1c in 3 months

## 2023-07-31 NOTE — Patient Instructions (Signed)
 Thank you so much for coming to the clinic today!   Please start taking the fiber supplement in addition to the Miralax   If you have any questions please feel free to the call the clinic at anytime at [phone]. It was a pleasure seeing you!  Best, Dr. Rayvon Char

## 2023-07-31 NOTE — Assessment & Plan Note (Signed)
 She was seen at urgent care on 07/20/2023 for constipation.  She had taken MiraLAX but was unsure how much water she is drinking per day she denies any opioid use at this time.  Her MiraLAX was increased to twice daily.  She was advised on starting docusate/senna daily as well as trialing over-the-counter magnesium citrate for 1 to 2 days. Will have her continue on MiraLAX and supplement with fiber while also encouraging increased hydration.  - Continue Miralax  - Encourage increased hydration  - Begin fiber supplementation

## 2023-07-31 NOTE — Progress Notes (Signed)
 CC: Diabetes follow-up   HPI: [name] is a 77 y.o. female living with a history stated below and presents today for diabetes follow-up. Please see problem based assessment and plan for additional details.  Past Medical History:  Diagnosis Date   Allergy    Anemia    Bilateral carpal tunnel syndrome 06/29/2018   Breast CA (HCC)    (Rt) breast ca dx 4/12---  S/P RADICAL MASTECTOMY AND CHEMORADIATION   Diabetes mellitus ORAL MED   Fatty liver disease, nonalcoholic    History of hyperkalemia    Hx antineoplastic chemo  10/12 - 06/24/11   PACLITAX EL COMPLETED 06/24/11   Hyperlipidemia    Hypertension    Mild nonproliferative diabetic retinopathy of right eye (HCC) 03/19/2011   Dr. Dede Query Poudyal   Numbness and tingling    Hx: of in fingers and toes since chemotherapy   OA (osteoarthritis) of knee    RIGHT LEG   Obesity    Personal history of chemotherapy    Personal history of radiation therapy    Renal lesion    Status post total left knee replacement 07/30/2020    Current Outpatient Medications on File Prior to Visit  Medication Sig Dispense Refill   Accu-Chek Softclix Lancets lancets Check blood sugar up to 7 times per week 100 each 3   atorvastatin (LIPITOR) 40 MG tablet TAKE 1 TABLET BY MOUTH EVERY DAY 90 tablet 1   cholecalciferol (VITAMIN D3) 25 MCG (1000 UNIT) tablet TAKE 2 TABLETS (2,000 UNITS TOTAL) BY MOUTH DAILY (Patient not taking: Reported on 07/20/2023) 100 tablet 1   cyclobenzaprine (FLEXERIL) 10 MG tablet Take 1 tablet (10 mg total) by mouth 2 (two) times daily as needed for muscle spasms. 20 tablet 0   DULoxetine (CYMBALTA) 30 MG capsule Take 1 capsule (30 mg total) by mouth daily. 30 capsule 2   empagliflozin (JARDIANCE) 10 MG TABS tablet Take 1 tablet (10 mg total) by mouth daily before breakfast. 90 tablet 2   ezetimibe (ZETIA) 10 MG tablet Take 1 tablet (10 mg total) by mouth daily. 90 tablet 3   fluticasone (FLONASE) 50 MCG/ACT nasal spray  Place 1 spray into both nostrils daily. 18.2 mL 2   glucose blood (ACCU-CHEK GUIDE) test strip Check blood sugar up to 7 times per week 100 each 3   HYDROcodone-acetaminophen (NORCO) 5-325 MG tablet Take 1 tablet by mouth 3 (three) times daily as needed. To be taken after surgery 20 tablet 0   lisinopril (ZESTRIL) 20 MG tablet Take 1 tablet (20 mg total) by mouth daily. 90 tablet 2   loratadine (CLARITIN) 10 MG tablet Take 1 tablet (10 mg total) by mouth daily. 90 tablet 0   naproxen (NAPROSYN) 500 MG tablet Take 1 tablet (500 mg total) by mouth 2 (two) times daily. 30 tablet 0   ondansetron (ZOFRAN) 4 MG tablet Take 1 tablet (4 mg total) by mouth every 8 (eight) hours as needed for nausea or vomiting. 40 tablet 0   polyethylene glycol (MIRALAX) 17 g packet Take 17 g by mouth daily. 14 each 0   senna-docusate (SENOKOT-S) 8.6-50 MG tablet Take 1 tablet by mouth daily. 30 tablet 0   tiZANidine (ZANAFLEX) 4 MG tablet Take 1 tablet (4 mg total) by mouth every 6 (six) hours as needed for muscle spasms. 30 tablet 0   No current facility-administered medications on file prior to visit.    Family History  Problem Relation Age of Onset   Stroke  Father    Breast cancer Sister    Cancer Sister        breast   Cancer Sister        COLON CANCER   Stroke Sister    Heart disease Brother     Social History   Socioeconomic History   Marital status: Married    Spouse name: Not on file   Number of children: 4   Years of education: 16   Highest education level: Not on file  Occupational History   Not on file  Tobacco Use   Smoking status: Never   Smokeless tobacco: Never  Vaping Use   Vaping status: Never Used  Substance and Sexual Activity   Alcohol use: No    Alcohol/week: 0.0 standard drinks of alcohol   Drug use: No   Sexual activity: Not Currently    Birth control/protection: Surgical    Comment: MENARCHE AGE 17, G4, P4, PARITY AGE 71, HRT 20+ YEARS    Other Topics Concern   Not on  file  Social History Narrative   Current Social History 01/14/2021        Patient lives with spouse in a home is 2 stories. There are 2 steps up to the entrance the patient uses with railings.      Patient's method of transportation is personal car.      The highest level of education was high school diploma.      The patient currently retired.      Identified important Relationships are "my family"      Pets : 1       Interests / Fun: "[employer]"       Current Stressors: "bills"      Religious / Personal Beliefs: Baptist       Social Drivers of Health   Financial Resource Strain: Low Risk  (06/01/2023)   Overall Financial Resource Strain (CARDIA)    Difficulty of Paying Living Expenses: Not very hard  Food Insecurity: No Food Insecurity (06/01/2023)   Hunger Vital Sign    Worried About Running Out of Food in the Last Year: Never true    Ran Out of Food in the Last Year: Never true  Transportation Needs: No Transportation Needs (06/01/2023)   PRAPARE - Administrator, Civil Service (Medical): No    Lack of Transportation (Non-Medical): No  Physical Activity: Insufficiently Active (06/01/2023)   Exercise Vital Sign    Days of Exercise per Week: 1 day    Minutes of Exercise per Session: 30 min  Stress: No Stress Concern Present (06/01/2023)   [employer] of Occupational Health - Occupational Stress Questionnaire    Feeling of Stress : Not at all  Social Connections: Socially Integrated (06/01/2023)   Social Connection and Isolation Panel [NHANES]    Frequency of Communication with Friends and Family: Twice a week    Frequency of Social Gatherings with Friends and Family: Twice a week    Attends Religious Services: More than 4 times per year    Active Member of [employer] or Organizations: Not on file    Attends Banker Meetings: 1 to 4 times per year    Marital Status: Married  Catering manager Violence: Not At Risk (06/01/2023)   Humiliation, Afraid,  Rape, and Kick questionnaire    Fear of Current or Ex-Partner: No    Emotionally Abused: No    Physically Abused: No    Sexually Abused: No    Review  of Systems: ROS negative except for what is noted on the assessment and plan.  Vitals:   07/31/23 0951 07/31/23 1019  BP: (!) 177/80 (!) 173/80  Pulse:  74  Temp: 97.6 F (36.4 C)   TempSrc: Oral   SpO2: 100%   Weight: 152 lb 1.6 oz (69 kg)   Height: 4\' 11"  (1.499 m)     Physical Exam: Constitutional: well-appearing in no acute distress HENT: normocephalic atraumatic, mucous membranes moist Eyes: conjunctiva non-erythematous Neck: supple Cardiovascular: regular rate and rhythm, no m/r/g Pulmonary/Chest: normal work of breathing on room air, lungs clear to auscultation bilaterally Abdominal: soft, non-tender, non-distended MSK: normal bulk and tone Neurological: alert & oriented x 3, 5/5 strength in bilateral upper and lower extremities, normal gait Skin: warm and dry  Assessment & Plan:   Type 2 diabetes mellitus with renal manifestations [facility]) Patient presents for diabetes follow-up.  Current medications include Trulicity 1.5 mg weekly and Jardiance 10 mg daily.  Glipizide 5 mg discontinued at her visit in 06/2023 due to concerns for hypoglycemia.  Last A1c in 04/2023 was 7.0.  Today, A1c is 6.3. No current changes to her regimen.  - Continue Trulicity 1.5 mg weekly and Jardiance 10 mg daily - Follow-up A1c in 3 months  Hypertension associated with diabetes (HCC) Current medications include lisinopril 20 mg daily. Blood pressure in office today is 177/80. Repeat is 173/80. Counseled on importance of recording this at home and logging her readings. She has not taken her medications today. Will have patient return for nurse's visit for repeat check next week. If it is still elevated at this time, will add low-dose amlodipine to her regimen.   Constipation She was seen at urgent care on 07/20/2023 for constipation.  She had  taken MiraLAX but was unsure how much water she is drinking per day she denies any opioid use at this time.  Her MiraLAX was increased to twice daily.  She was advised on starting docusate/senna daily as well as trialing over-the-counter magnesium citrate for 1 to 2 days. Will have her continue on MiraLAX and supplement with fiber while also encouraging increased hydration.  - Continue Miralax  - Encourage increased hydration  - Begin fiber supplementation   Patient discussed with Dr. Collier Flowers, MD  [facility] Internal Medicine, PGY-1 Date 07/31/2023 Time 10:37 AM

## 2023-08-02 NOTE — Progress Notes (Signed)
 Internal Medicine Clinic Attending  Case discussed with the resident at the time of the visit.  We reviewed the resident's history and exam and pertinent patient test results.  I agree with the assessment, diagnosis, and plan of care documented in the resident's note.

## 2023-08-04 ENCOUNTER — Other Ambulatory Visit: Payer: Self-pay | Admitting: Student

## 2023-08-04 DIAGNOSIS — E1121 Type 2 diabetes mellitus with diabetic nephropathy: Secondary | ICD-10-CM

## 2023-08-04 NOTE — Telephone Encounter (Signed)
 Medication discontinued 07/03/23

## 2023-08-08 ENCOUNTER — Other Ambulatory Visit: Payer: Self-pay | Admitting: Student

## 2023-08-08 DIAGNOSIS — E1143 Type 2 diabetes mellitus with diabetic autonomic (poly)neuropathy: Secondary | ICD-10-CM

## 2023-09-20 ENCOUNTER — Emergency Department (HOSPITAL_COMMUNITY)
Admission: EM | Admit: 2023-09-20 | Discharge: 2023-09-20 | Disposition: A | Discharge status: Home / Self Care | Attending: Emergency Medicine | Admitting: Emergency Medicine

## 2023-09-20 ENCOUNTER — Encounter (HOSPITAL_COMMUNITY): Payer: Self-pay

## 2023-09-20 ENCOUNTER — Other Ambulatory Visit: Payer: Self-pay

## 2023-09-20 DIAGNOSIS — Z7984 Long term (current) use of oral hypoglycemic drugs: Secondary | ICD-10-CM | POA: Insufficient documentation

## 2023-09-20 DIAGNOSIS — R600 Localized edema: Secondary | ICD-10-CM | POA: Diagnosis present

## 2023-09-20 DIAGNOSIS — E119 Type 2 diabetes mellitus without complications: Secondary | ICD-10-CM | POA: Diagnosis not present

## 2023-09-20 DIAGNOSIS — Z79899 Other long term (current) drug therapy: Secondary | ICD-10-CM | POA: Diagnosis not present

## 2023-09-20 DIAGNOSIS — I10 Essential (primary) hypertension: Secondary | ICD-10-CM | POA: Insufficient documentation

## 2023-09-20 LAB — HEPATIC FUNCTION PANEL
ALT: 32 U/L (ref 0–44)
AST: 26 U/L (ref 15–41)
Albumin: 4 g/dL (ref 3.5–5.0)
Alkaline Phosphatase: 61 U/L (ref 38–126)
Bilirubin, Direct: 0.1 mg/dL (ref 0.0–0.2)
Indirect Bilirubin: 0.6 mg/dL (ref 0.3–0.9)
Total Bilirubin: 0.7 mg/dL (ref 0.0–1.2)
Total Protein: 6.4 g/dL — ABNORMAL LOW (ref 6.5–8.1)

## 2023-09-20 LAB — BRAIN NATRIURETIC PEPTIDE: B Natriuretic Peptide: 21 pg/mL (ref 0.0–100.0)

## 2023-09-20 LAB — URINALYSIS, ROUTINE W REFLEX MICROSCOPIC
Bacteria, UA: NONE SEEN
Bilirubin Urine: NEGATIVE
Glucose, UA: >=500 mg/dL — AB
Hgb urine dipstick: NEGATIVE
Ketones, ur: NEGATIVE mg/dL
Leukocytes,Ua: NEGATIVE
Nitrite: NEGATIVE
Protein, ur: NEGATIVE mg/dL
Specific Gravity, Urine: 1.018 (ref 1.005–1.030)
pH: 6 (ref 5.0–8.0)

## 2023-09-20 LAB — CBC WITH DIFFERENTIAL/PLATELET
Abs Immature Granulocytes: 0.01 10*3/uL (ref 0.00–0.07)
Basophils Absolute: 0 10*3/uL (ref 0.0–0.1)
Basophils Relative: 1 %
Eosinophils Absolute: 0.1 10*3/uL (ref 0.0–0.5)
Eosinophils Relative: 1 %
HCT: 36.4 % (ref 36.0–46.0)
Hemoglobin: 11.9 g/dL — ABNORMAL LOW (ref 12.0–15.0)
Immature Granulocytes: 0 %
Lymphocytes Relative: 27 %
Lymphs Abs: 1.4 10*3/uL (ref 0.7–4.0)
MCH: 30.9 pg (ref 26.0–34.0)
MCHC: 32.7 g/dL (ref 30.0–36.0)
MCV: 94.5 fL (ref 80.0–100.0)
Monocytes Absolute: 0.4 10*3/uL (ref 0.1–1.0)
Monocytes Relative: 7 %
Neutro Abs: 3.4 10*3/uL (ref 1.7–7.7)
Neutrophils Relative %: 64 %
Platelets: 192 10*3/uL (ref 150–400)
RBC: 3.85 MIL/uL — ABNORMAL LOW (ref 3.87–5.11)
RDW: 16 % — ABNORMAL HIGH (ref 11.5–15.5)
WBC: 5.3 10*3/uL (ref 4.0–10.5)
nRBC: 0 % (ref 0.0–0.2)

## 2023-09-20 LAB — BASIC METABOLIC PANEL WITH GFR
Anion gap: 12 (ref 5–15)
BUN: 15 mg/dL (ref 8–23)
CO2: 25 mmol/L (ref 22–32)
Calcium: 9.3 mg/dL (ref 8.9–10.3)
Chloride: 104 mmol/L (ref 98–111)
Creatinine, Ser: 0.86 mg/dL (ref 0.44–1.00)
GFR, Estimated: >60 mL/min (ref >60)
Glucose, Bld: 114 mg/dL — ABNORMAL HIGH (ref 70–99)
Potassium: 3.4 mmol/L — ABNORMAL LOW (ref 3.5–5.1)
Sodium: 141 mmol/L (ref 135–145)

## 2023-09-20 MED ORDER — FUROSEMIDE 20 MG PO TABS: 20.0000 mg | ORAL_TABLET | Freq: Every day | ORAL | 0 refills | Status: DC

## 2023-09-20 NOTE — ED Triage Notes (Signed)
 Pt came in via POV d/t bil feet swelling & the Lt one is painful since yesterday. A/Ox4, rates her pain 2/10 during triage.

## 2023-09-20 NOTE — Discharge Instructions (Signed)
 Your tests are normal Your blood pressure is a little high Have your doctor check you in 3 days for recheck. You can take lasix once a day for 7 days to help with the swelling  ER for worsening symptoms.

## 2023-09-20 NOTE — ED Notes (Signed)
 Lab called to add on test to previously obtained blood sample

## 2023-09-20 NOTE — ED Provider Triage Note (Signed)
 Emergency Medicine Provider Triage Evaluation Note  [name] , a 78 y.o. female  was evaluated in triage.  Pt complains of left leg cramp and some swelling.  Pt reports legs are more swollen than usual  Review of Systems  Positive: swelling Negative: No shortness of breath  Physical Exam  BP (!) 166/79 (BP Location: Left Arm)   Pulse 85   Temp 98.5 F (36.9 C)   Resp 17   Ht 4\' 11"  (1.499 m)   Wt 68 kg   SpO2 99%   BMI 30.30 kg/m  Gen:   Awake, no distress   Resp:  Normal effort  MSK:   Moves extremities without difficulty  Other:  Positive homans left leg  Medical Decision Making  Medically screening exam initiated at 5:59 PM.  Appropriate orders placed.  [name] was informed that the remainder of the evaluation will be completed by another provider, this initial triage assessment does not replace that evaluation, and the importance of remaining in the ED until their evaluation is complete.     Sandi Crosby, PA-C 09/20/23 1801

## 2023-09-20 NOTE — ED Provider Notes (Signed)
 [facility] Provider Note   CSN: [id number] Arrival date & time: 09/20/23  1623     History  Chief Complaint  Patient presents with   Bil feet swell   Foot Pain    [name] is a 78 y.o. female.   Foot Pain   This patient is a 78 year old female, she has a history of diabetes and hypertension, she does not take amlodipine , she presents to the hospital with a complaint of swelling of her lower extremities, this is mostly at the feet and ankles.  She reports she has noticed it more over the last couple of days, it seems to get worse the longer she is on her feet and better in the morning.  She has been wearing shoes the last 2 days and notes that her feet have the imprints from the shoes on them when she takes them off.  She has no shortness of breath, no swelling above the ankles, no pain, she does have a slight numbness of both of her feet which she has had ever since she had chemotherapy over 12 years ago for cancer.  She is not currently on chemotherapy.  No history of liver failure or kidney failure or heart failure to the best of her knowledge.    Home Medications Prior to Admission medications   Medication Sig Start Date End Date Taking? Authorizing Provider  furosemide (LASIX) 20 MG tablet Take 1 tablet (20 mg total) by mouth daily for 7 days. 09/20/23 09/27/23 Yes Early Glisson, MD  Accu-Chek Softclix Lancets lancets Check blood sugar up to 7 times per week 07/22/22   Alyce Jubilee, MD  atorvastatin  (LIPITOR) 40 MG tablet TAKE 1 TABLET BY MOUTH EVERY DAY 06/24/23   Maxie Spaniel, MD  cholecalciferol (VITAMIN D3) 25 MCG (1000 UNIT) tablet TAKE 2 TABLETS (2,000 UNITS TOTAL) BY MOUTH DAILY Patient not taking: Reported on 07/20/2023 06/28/23   Maxie Spaniel, MD  cyclobenzaprine  (FLEXERIL ) 10 MG tablet Take 1 tablet (10 mg total) by mouth 2 (two) times daily as needed for muscle spasms. 01/16/23   Ninetta Basket, MD   Dulaglutide  (TRULICITY ) 1.5 MG/0.5ML SOAJ Inject 1.5 mg into the skin once a week. 07/31/23   Maxie Spaniel, MD  DULoxetine  (CYMBALTA ) 30 MG capsule TAKE 1 CAPSULE BY MOUTH EVERY DAY 08/10/23   Maxie Spaniel, MD  empagliflozin  (JARDIANCE ) 10 MG TABS tablet Take 1 tablet (10 mg total) by mouth daily before breakfast. 12/29/22   Maxie Spaniel, MD  ezetimibe  (ZETIA ) 10 MG tablet Take 1 tablet (10 mg total) by mouth daily. 05/02/23 05/01/24  Jackolyn Masker, MD  fluticasone  (FLONASE ) 50 MCG/ACT nasal spray Place 1 spray into both nostrils daily. 12/11/21   Atway, Rayann N, DO  glucose blood (ACCU-CHEK GUIDE) test strip Check blood sugar up to 7 times per week 07/22/22   [name], Vasilios, MD  HYDROcodone -acetaminophen  (NORCO) 5-325 MG tablet Take 1 tablet by mouth 3 (three) times daily as needed. To be taken after surgery 06/08/23   Sandie Cross, PA-C  lisinopril  (ZESTRIL ) 20 MG tablet Take 1 tablet (20 mg total) by mouth daily. 12/29/22   Maxie Spaniel, MD  loratadine  (CLARITIN ) 10 MG tablet Take 1 tablet (10 mg total) by mouth daily. 09/19/21 12/18/21  Ariwodo, Shirley, MD  naproxen  (NAPROSYN ) 500 MG tablet Take 1 tablet (500 mg total) by mouth 2 (two) times daily. 07/07/23   Adel Aden, PA-C  ondansetron  (ZOFRAN ) 4 MG tablet Take 1  tablet (4 mg total) by mouth every 8 (eight) hours as needed for nausea or vomiting. 06/08/23   Sandie Cross, PA-C  polyethylene glycol (MIRALAX ) 17 g packet Take 17 g by mouth daily. 09/05/21   Ariwodo, Shirley, MD  psyllium (METAMUCIL) 58.6 % packet Take 1 packet by mouth daily. 07/31/23   Maxie Spaniel, MD  senna-docusate (SENOKOT-S) 8.6-50 MG tablet Take 1 tablet by mouth daily. 07/20/23   Jha, Panav, MD  tiZANidine  (ZANAFLEX ) 4 MG tablet Take 1 tablet (4 mg total) by mouth every 6 (six) hours as needed for muscle spasms. 07/07/23   Groce, Christopher F, PA-C      Allergies    Hydrochlorothiazide     Review of Systems   Review of Systems  All other systems  reviewed and are negative.   Physical Exam Updated Vital Signs BP (!) 170/79   Pulse 78   Temp 98.2 F (36.8 C) (Oral)   Resp 17   Ht 1.499 m (4\' 11" )   Wt 68 kg   SpO2 100%   BMI 30.30 kg/m  Physical Exam Vitals and nursing note reviewed.  Constitutional:      General: She is not in acute distress.    Appearance: She is well-developed.  HENT:     Head: Normocephalic and atraumatic.     Mouth/Throat:     Pharynx: No oropharyngeal exudate.  Eyes:     General: No scleral icterus.       Right eye: No discharge.        Left eye: No discharge.     Conjunctiva/sclera: Conjunctivae normal.     Pupils: Pupils are equal, round, and reactive to light.  Neck:     Thyroid : No thyromegaly.     Vascular: No JVD.  Cardiovascular:     Rate and Rhythm: Normal rate and regular rhythm.     Heart sounds: Normal heart sounds. No murmur heard.    No friction rub. No gallop.  Pulmonary:     Effort: Pulmonary effort is normal. No respiratory distress.     Breath sounds: Normal breath sounds. No wheezing or rales.  Abdominal:     General: Bowel sounds are normal. There is no distension.     Palpations: Abdomen is soft. There is no mass.     Tenderness: There is no abdominal tenderness.  Musculoskeletal:        General: No tenderness. Normal range of motion.     Cervical back: Normal range of motion and neck supple.     Right lower leg: Edema present.     Left lower leg: Edema present.     Comments: Very subtle edema of the bilateral feet, no edema above the feet.  Symmetrical.  Normal perfusion of the feet, normal pulses, normal capillary refill, they are both warm, no discoloration  Lymphadenopathy:     Cervical: No cervical adenopathy.  Skin:    General: Skin is warm and dry.     Findings: No erythema or rash.  Neurological:     Mental Status: She is alert.     Coordination: Coordination normal.  Psychiatric:        Behavior: Behavior normal.     ED Results / Procedures /  Treatments   Labs (all labs ordered are listed, but only abnormal results are displayed) Labs Reviewed  CBC WITH DIFFERENTIAL/PLATELET - Abnormal; Notable for the following components:      Result Value   RBC 3.85 (*)    Hemoglobin 11.9 (*)  RDW 16.0 (*)    All other components within normal limits  BASIC METABOLIC PANEL WITH GFR - Abnormal; Notable for the following components:   Potassium 3.4 (*)    Glucose, Bld 114 (*)    All other components within normal limits  HEPATIC FUNCTION PANEL - Abnormal; Notable for the following components:   Total Protein 6.4 (*)    All other components within normal limits  URINALYSIS, ROUTINE W REFLEX MICROSCOPIC - Abnormal; Notable for the following components:   Color, Urine STRAW (*)    Glucose, UA >=500 (*)    All other components within normal limits  BRAIN NATRIURETIC PEPTIDE    EKG None  Radiology No results found.  Procedures Procedures    Medications Ordered in ED Medications - No data to display  ED Course/ Medical Decision Making/ A&P                                 Medical Decision Making Amount and/or Complexity of Data Reviewed Labs: ordered.  Risk Prescription drug management.    This patient presents to the ED for concern of swelling of the feet differential diagnosis includes could be related to kidney liver or heart failure though this seems unlikely.  She has had cancer with chemo in the past and has some neuropathy of her feet.  This is minimal swelling and she does not appear ill, with her history of cancer would consider DVT but this seems symmetrical bilateral and there is no leg pain otherwise.    Additional history obtained:  Additional history obtained from electronic medical record, going back several years there does not appear to be any cardiology notes External records from outside source obtained and reviewed including had Doppler ultrasound in January 2025, no DVT seen   Lab Tests:  I  Ordered, and personally interpreted labs.  The pertinent results include:  labs unremarkable, pt stable.   Medicines ordered and prescription drug management:  I ordered medication including lasix  for home  Reevaluation of the patient after these medicines showed that the patient stayed the same I have reviewed the patients home medicines and have made adjustments as needed   Problem List / ED Course:  Well appearing No pathological findings Stable for d/c    Social Determinants of Health:  None  I have discussed with the patient at the bedside the results, and the meaning of these results.  They have had opportunity to ask questions,  expressed their understanding to the need for follow-up with primary care physician            Final Clinical Impression(s) / ED Diagnoses Final diagnoses:  Edema of foot    Rx / DC Orders ED Discharge Orders          Ordered    furosemide (LASIX) 20 MG tablet  Daily        09/20/23 2113              Early Glisson, MD 09/20/23 2116

## 2023-10-01 ENCOUNTER — Other Ambulatory Visit: Payer: Self-pay

## 2023-10-01 ENCOUNTER — Ambulatory Visit: Admitting: Student

## 2023-10-01 ENCOUNTER — Encounter: Payer: Self-pay | Admitting: Student

## 2023-10-01 VITALS — BP 155/62 | HR 91 | Temp 98.1°F | Ht 59.0 in | Wt 148.2 lb

## 2023-10-01 DIAGNOSIS — I872 Venous insufficiency (chronic) (peripheral): Secondary | ICD-10-CM

## 2023-10-01 NOTE — Assessment & Plan Note (Signed)
 Patient presented to clinic as an ED follow-up from 09/20/23. At this visit, she endorsed a two day history of swelling in her legs that was new. Workup was unremarkable. BNP WNL. No signs/symptoms concerning for a DVT. Today, she notes persistent bilateral lower extremity edema to her ankles. She is unsure if this is worse at the beginning, middle, or end of the day. She has noticed improvement with leg elevation but has not tried any compression stockings. She denies any chest pain, SOB, or other signs or symptoms. Given her associated discoloration of her ankles, her presentation is consistent with venous insufficiency. At this time, will recommend conservative treatment.  Counseled patient on following up if her signs/systems do not improve.  - Compression stockings and leg elevation

## 2023-10-01 NOTE — Progress Notes (Signed)
 CC: BLE edema  HPI: [name] is a 78 y.o. female living with a history stated below and presents today for bilateral lower extremity edema. Please see problem based assessment and plan for additional details.  Past Medical History:  Diagnosis Date   Allergy    Anemia    Bilateral carpal tunnel syndrome 06/29/2018   Breast CA (HCC)    (Rt) breast ca dx 4/12---  S/P RADICAL MASTECTOMY AND CHEMORADIATION   Diabetes mellitus ORAL MED   Fatty liver disease, nonalcoholic    History of hyperkalemia    Hx antineoplastic chemo  10/12 - 06/24/11   PACLITAX EL COMPLETED 06/24/11   Hyperlipidemia    Hypertension    Mild nonproliferative diabetic retinopathy of right eye (HCC) 03/19/2011   Dr. Jannetta Men Poudyal   Numbness and tingling    Hx: of in fingers and toes since chemotherapy   OA (osteoarthritis) of knee    RIGHT LEG   Obesity    Personal history of chemotherapy    Personal history of radiation therapy    Renal lesion    Status post total left knee replacement 07/30/2020    Current Outpatient Medications on File Prior to Visit  Medication Sig Dispense Refill   Accu-Chek Softclix Lancets lancets Check blood sugar up to 7 times per week 100 each 3   atorvastatin  (LIPITOR) 40 MG tablet TAKE 1 TABLET BY MOUTH EVERY DAY 90 tablet 1   cholecalciferol (VITAMIN D3) 25 MCG (1000 UNIT) tablet TAKE 2 TABLETS (2,000 UNITS TOTAL) BY MOUTH DAILY (Patient not taking: Reported on 07/20/2023) 100 tablet 1   cyclobenzaprine  (FLEXERIL ) 10 MG tablet Take 1 tablet (10 mg total) by mouth 2 (two) times daily as needed for muscle spasms. 20 tablet 0   Dulaglutide  (TRULICITY ) 1.5 MG/0.5ML SOAJ Inject 1.5 mg into the skin once a week. 4 mL 2   DULoxetine  (CYMBALTA ) 30 MG capsule TAKE 1 CAPSULE BY MOUTH EVERY DAY 90 capsule 1   empagliflozin  (JARDIANCE ) 10 MG TABS tablet Take 1 tablet (10 mg total) by mouth daily before breakfast. 90 tablet 2   ezetimibe  (ZETIA ) 10 MG tablet Take 1 tablet (10 mg  total) by mouth daily. 90 tablet 3   fluticasone  (FLONASE ) 50 MCG/ACT nasal spray Place 1 spray into both nostrils daily. 18.2 mL 2   furosemide  (LASIX ) 20 MG tablet Take 1 tablet (20 mg total) by mouth daily for 7 days. 7 tablet 0   glucose blood (ACCU-CHEK GUIDE) test strip Check blood sugar up to 7 times per week 100 each 3   HYDROcodone -acetaminophen  (NORCO) 5-325 MG tablet Take 1 tablet by mouth 3 (three) times daily as needed. To be taken after surgery 20 tablet 0   lisinopril  (ZESTRIL ) 20 MG tablet Take 1 tablet (20 mg total) by mouth daily. 90 tablet 2   loratadine  (CLARITIN ) 10 MG tablet Take 1 tablet (10 mg total) by mouth daily. 90 tablet 0   naproxen  (NAPROSYN ) 500 MG tablet Take 1 tablet (500 mg total) by mouth 2 (two) times daily. 30 tablet 0   ondansetron  (ZOFRAN ) 4 MG tablet Take 1 tablet (4 mg total) by mouth every 8 (eight) hours as needed for nausea or vomiting. 40 tablet 0   polyethylene glycol (MIRALAX ) 17 g packet Take 17 g by mouth daily. 14 each 0   psyllium (METAMUCIL) 58.6 % packet Take 1 packet by mouth daily. 30 each 12   senna-docusate (SENOKOT-S) 8.6-50 MG tablet Take 1 tablet by mouth  daily. 30 tablet 0   tiZANidine  (ZANAFLEX ) 4 MG tablet Take 1 tablet (4 mg total) by mouth every 6 (six) hours as needed for muscle spasms. 30 tablet 0   No current facility-administered medications on file prior to visit.    Family History  Problem Relation Age of Onset   Stroke Father    Breast cancer Sister    Cancer Sister        breast   Cancer Sister        COLON CANCER   Stroke Sister    Heart disease Brother     Social History   Socioeconomic History   Marital status: Married    Spouse name: Not on file   Number of children: 4   Years of education: 16   Highest education level: Not on file  Occupational History   Not on file  Tobacco Use   Smoking status: Never   Smokeless tobacco: Never  Vaping Use   Vaping status: Never Used  Substance and Sexual  Activity   Alcohol  use: No    Alcohol /week: 0.0 standard drinks of alcohol    Drug use: No   Sexual activity: Not Currently    Birth control/protection: Surgical    Comment: MENARCHE AGE 52, G4, P4, PARITY AGE 72, HRT 20+ YEARS    Other Topics Concern   Not on file  Social History Narrative   Current Social History 01/14/2021        Patient lives with spouse in a home is 2 stories. There are 2 steps up to the entrance the patient uses with railings.      Patient's method of transportation is personal car.      The highest level of education was high school diploma.      The patient currently retired.      Identified important Relationships are "my family"      Pets : 1       Interests / Fun: "[employer]"       Current Stressors: "bills"      Religious / Personal Beliefs: [employer]       Social Drivers of Health   Financial Resource Strain: Low Risk  (06/01/2023)   Overall Financial Resource Strain (CARDIA)    Difficulty of Paying Living Expenses: Not very hard  Food Insecurity: No Food Insecurity (06/01/2023)   Hunger Vital Sign    Worried About Running Out of Food in the Last Year: Never true    Ran Out of Food in the Last Year: Never true  Transportation Needs: No Transportation Needs (06/01/2023)   PRAPARE - Administrator, Civil Service (Medical): No    Lack of Transportation (Non-Medical): No  Physical Activity: Insufficiently Active (06/01/2023)   Exercise Vital Sign    Days of Exercise per Week: 1 day    Minutes of Exercise per Session: 30 min  Stress: No Stress Concern Present (06/01/2023)   [employer] of Occupational Health - Occupational Stress Questionnaire    Feeling of Stress : Not at all  Social Connections: Socially Integrated (06/01/2023)   Social Connection and Isolation Panel [NHANES]    Frequency of Communication with Friends and Family: Twice a week    Frequency of Social Gatherings with Friends and Family: Twice a week    Attends  Religious Services: More than 4 times per year    Active Member of [employer] or Organizations: Not on file    Attends Banker Meetings: 1 to 4  times per year    Marital Status: Married  Catering manager Violence: Not At Risk (06/01/2023)   Humiliation, Afraid, Rape, and Kick questionnaire    Fear of Current or Ex-Partner: No    Emotionally Abused: No    Physically Abused: No    Sexually Abused: No    Review of Systems: ROS negative except for what is noted on the assessment and plan.  Vitals:   10/01/23 1313 10/01/23 1315  BP: (!) 157/60 (!) 155/62  Pulse: 89 91  Temp: 98.1 F (36.7 C)   TempSrc: Oral   SpO2: 100%   Weight: 148 lb 3.2 oz (67.2 kg)   Height: 4\' 11"  (1.499 m)     Physical Exam: Constitutional: well-appearing in no acute distress HENT: normocephalic atraumatic, mucous membranes moist Eyes: conjunctiva non-erythematous Neck: supple Cardiovascular: regular rate and rhythm, no m/r/g Pulmonary/Chest: normal work of breathing on room air, lungs clear to auscultation bilaterally Abdominal: soft, non-tender, non-distended MSK: normal bulk and tone Neurological: alert & oriented x 3, 5/5 strength in bilateral upper and lower extremities, normal gait Skin: warm and dry  Assessment & Plan:   Venous insufficiency Patient presented to clinic as an ED follow-up from 09/20/23. At this visit, she endorsed a two day history of swelling in her legs that was new. Workup was unremarkable. BNP WNL. No signs/symptoms concerning for a DVT. Today, she notes persistent bilateral lower extremity edema to her ankles. She is unsure if this is worse at the beginning, middle, or end of the day. She has noticed improvement with leg elevation but has not tried any compression stockings. She denies any chest pain, SOB, or other signs or symptoms. Given her associated discoloration of her ankles, her presentation is consistent with venous insufficiency. At this time, will recommend  conservative treatment.  Counseled patient on following up if her signs/systems do not improve.  - Compression stockings and leg elevation  Patient discussed with Dr. Brigitte Canard, MD  [facility] Internal Medicine, PGY-1 Date 10/01/2023 Time 2:52 PM

## 2023-10-01 NOTE — Patient Instructions (Signed)
 Thank you so much for coming to the clinic today!   Please let us  know if your swelling does not improve or worsens with the treatment.   If you have any questions please feel free to the call the clinic at anytime at [phone]. It was a pleasure seeing you!  Best, Dr. Carolee Churchman

## 2023-10-08 NOTE — Progress Notes (Signed)
 Internal Medicine Clinic Attending  Case discussed with the resident at the time of the visit.  We reviewed the resident's history and exam and pertinent patient test results.  I agree with the assessment, diagnosis, and plan of care documented in the resident's note.

## 2023-10-12 ENCOUNTER — Other Ambulatory Visit (HOSPITAL_COMMUNITY): Payer: Self-pay

## 2023-10-13 ENCOUNTER — Encounter: Payer: Self-pay | Admitting: *Deleted

## 2023-10-28 ENCOUNTER — Other Ambulatory Visit (HOSPITAL_COMMUNITY): Payer: Self-pay

## 2023-11-02 ENCOUNTER — Ambulatory Visit: Admitting: Student

## 2023-11-02 ENCOUNTER — Other Ambulatory Visit: Payer: Self-pay

## 2023-11-02 ENCOUNTER — Encounter: Payer: Self-pay | Admitting: Student

## 2023-11-02 VITALS — BP 113/50 | HR 72 | Temp 98.0°F | Ht 59.0 in | Wt 146.0 lb

## 2023-11-02 DIAGNOSIS — E1143 Type 2 diabetes mellitus with diabetic autonomic (poly)neuropathy: Secondary | ICD-10-CM

## 2023-11-02 DIAGNOSIS — Z7984 Long term (current) use of oral hypoglycemic drugs: Secondary | ICD-10-CM

## 2023-11-02 DIAGNOSIS — E1121 Type 2 diabetes mellitus with diabetic nephropathy: Secondary | ICD-10-CM | POA: Diagnosis not present

## 2023-11-02 DIAGNOSIS — I959 Hypotension, unspecified: Secondary | ICD-10-CM | POA: Diagnosis not present

## 2023-11-02 DIAGNOSIS — Z7985 Long-term (current) use of injectable non-insulin antidiabetic drugs: Secondary | ICD-10-CM

## 2023-11-02 DIAGNOSIS — I152 Hypertension secondary to endocrine disorders: Secondary | ICD-10-CM

## 2023-11-02 LAB — POCT GLYCOSYLATED HEMOGLOBIN (HGB A1C): Hemoglobin A1C: 7.5 % — AB (ref 4.0–5.6)

## 2023-11-02 LAB — GLUCOSE, CAPILLARY: Glucose-Capillary: 172 mg/dL — ABNORMAL HIGH (ref 70–99)

## 2023-11-02 NOTE — Patient Instructions (Addendum)
 Thank you, Ms.Carolene A Aguiniga for allowing us  to provide your care today. Today we discussed your blood pressure and your neuropathy. Please continue taking your medicine as discussed . Do not take your lisinopril  when you go home until I see you again next week    I have ordered the following labs for you:  Lab Orders         Basic Metabolic Panel Without GFR         POC Hbg A1C       Tests ordered today:    Referrals ordered today:   Referral Orders         Ambulatory referral to Ophthalmology       I have ordered the following medication/changed the following medications:   Stop the following medications: Medications Discontinued During This Encounter  Medication Reason   furosemide  (LASIX ) 20 MG tablet No longer needed (for PRN medications)   loratadine  (CLARITIN ) 10 MG tablet No longer needed (for PRN medications)     Start the following medications: No orders of the defined types were placed in this encounter.    Follow up: 3 months   Remember:  Return in  week for BP check and medication reconciliation   Should you have any questions or concerns please call the internal medicine clinic at [phone].    Drue Grow, M.D [facility]

## 2023-11-02 NOTE — Assessment & Plan Note (Signed)
 Patient has a history of diabetes currently being managed with Trulicity  and Jardiance .  Her last hemoglobin A1c was at goal of 6.3 about 3 months ago.  Fortunately repeat hemoglobin A1c today is 7.5.  She however reports good adherence to her home regimen.  She denies any polydipsia or polyuria at this time.  Discussed the importance of medication adherence and complications of uncontrolled diabetes. -Will continue Trulicity  1.5 mg weekly

## 2023-11-02 NOTE — Progress Notes (Signed)
 CC: Routine Follow up   HPI:  [name] is a 78 y.o. female living with a history stated below and presents today for routine follow up and also to discuss her chronic conditions. Please see problem based assessment and plan for additional details.  Past Medical History:  Diagnosis Date   Allergy    Anemia    Bilateral carpal tunnel syndrome 06/29/2018   Breast CA (HCC)    (Rt) breast ca dx 4/12---  S/P RADICAL MASTECTOMY AND CHEMORADIATION   Diabetes mellitus ORAL MED   Fatty liver disease, nonalcoholic    History of hyperkalemia    Hx antineoplastic chemo  10/12 - 06/24/11   PACLITAX EL COMPLETED 06/24/11   Hyperlipidemia    Hypertension    Mild nonproliferative diabetic retinopathy of right eye (HCC) 03/19/2011   Dr. Garrel Poudyal   Numbness and tingling    Hx: of in fingers and toes since chemotherapy   OA (osteoarthritis) of knee    RIGHT LEG   Obesity    Personal history of chemotherapy    Personal history of radiation therapy    Renal lesion    Status post total left knee replacement 07/30/2020    Current Outpatient Medications on File Prior to Visit  Medication Sig Dispense Refill   Accu-Chek Softclix Lancets lancets Check blood sugar up to 7 times per week 100 each 3   atorvastatin  (LIPITOR) 40 MG tablet TAKE 1 TABLET BY MOUTH EVERY DAY 90 tablet 1   cholecalciferol (VITAMIN D3) 25 MCG (1000 UNIT) tablet TAKE 2 TABLETS (2,000 UNITS TOTAL) BY MOUTH DAILY (Patient not taking: Reported on 07/20/2023) 100 tablet 1   cyclobenzaprine  (FLEXERIL ) 10 MG tablet Take 1 tablet (10 mg total) by mouth 2 (two) times daily as needed for muscle spasms. 20 tablet 0   Dulaglutide  (TRULICITY ) 1.5 MG/0.5ML SOAJ Inject 1.5 mg into the skin once a week. 4 mL 2   DULoxetine  (CYMBALTA ) 30 MG capsule TAKE 1 CAPSULE BY MOUTH EVERY DAY 90 capsule 1   empagliflozin  (JARDIANCE ) 10 MG TABS tablet Take 1 tablet (10 mg total) by mouth daily before breakfast. 90 tablet 2   ezetimibe   (ZETIA ) 10 MG tablet Take 1 tablet (10 mg total) by mouth daily. 90 tablet 3   fluticasone  (FLONASE ) 50 MCG/ACT nasal spray Place 1 spray into both nostrils daily. 18.2 mL 2   glucose blood (ACCU-CHEK GUIDE) test strip Check blood sugar up to 7 times per week 100 each 3   HYDROcodone -acetaminophen  (NORCO) 5-325 MG tablet Take 1 tablet by mouth 3 (three) times daily as needed. To be taken after surgery 20 tablet 0   lisinopril  (ZESTRIL ) 20 MG tablet Take 1 tablet (20 mg total) by mouth daily. 90 tablet 2   naproxen  (NAPROSYN ) 500 MG tablet Take 1 tablet (500 mg total) by mouth 2 (two) times daily. 30 tablet 0   ondansetron  (ZOFRAN ) 4 MG tablet Take 1 tablet (4 mg total) by mouth every 8 (eight) hours as needed for nausea or vomiting. 40 tablet 0   polyethylene glycol (MIRALAX ) 17 g packet Take 17 g by mouth daily. 14 each 0   psyllium (METAMUCIL) 58.6 % packet Take 1 packet by mouth daily. 30 each 12   senna-docusate (SENOKOT-S) 8.6-50 MG tablet Take 1 tablet by mouth daily. 30 tablet 0   tiZANidine  (ZANAFLEX ) 4 MG tablet Take 1 tablet (4 mg total) by mouth every 6 (six) hours as needed for muscle spasms. 30 tablet 0  No current facility-administered medications on file prior to visit.    Family History  Problem Relation Age of Onset   Stroke Father    Breast cancer Sister    Cancer Sister        breast   Cancer Sister        COLON CANCER   Stroke Sister    Heart disease Brother     Social History   Socioeconomic History   Marital status: Married    Spouse name: Not on file   Number of children: 4   Years of education: 16   Highest education level: Not on file  Occupational History   Not on file  Tobacco Use   Smoking status: Never   Smokeless tobacco: Never  Vaping Use   Vaping status: Never Used  Substance and Sexual Activity   Alcohol  use: No    Alcohol /week: 0.0 standard drinks of alcohol    Drug use: No   Sexual activity: Not Currently    Birth control/protection:  Surgical    Comment: MENARCHE AGE 66, G4, P4, PARITY AGE 78, HRT 20+ YEARS    Other Topics Concern   Not on file  Social History Narrative   Current Social History 01/14/2021        Patient lives with spouse in a home is 2 stories. There are 2 steps up to the entrance the patient uses with railings.      Patient's method of transportation is personal car.      The highest level of education was high school diploma.      The patient currently retired.      Identified important Relationships are my family      Pets : 1       Interests / Fun: [employer]       Current Stressors: bills      Religious / Personal Beliefs: [employer]       Social Drivers of Health   Financial Resource Strain: Low Risk  (06/01/2023)   Overall Financial Resource Strain (CARDIA)    Difficulty of Paying Living Expenses: Not very hard  Food Insecurity: No Food Insecurity (06/01/2023)   Hunger Vital Sign    Worried About Running Out of Food in the Last Year: Never true    Ran Out of Food in the Last Year: Never true  Transportation Needs: No Transportation Needs (06/01/2023)   PRAPARE - Administrator, Civil Service (Medical): No    Lack of Transportation (Non-Medical): No  Physical Activity: Insufficiently Active (06/01/2023)   Exercise Vital Sign    Days of Exercise per Week: 1 day    Minutes of Exercise per Session: 30 min  Stress: No Stress Concern Present (06/01/2023)   [employer] of Occupational Health - Occupational Stress Questionnaire    Feeling of Stress : Not at all  Social Connections: Socially Integrated (06/01/2023)   Social Connection and Isolation Panel    Frequency of Communication with Friends and Family: Twice a week    Frequency of Social Gatherings with Friends and Family: Twice a week    Attends Religious Services: More than 4 times per year    Active Member of [employer] or Organizations: Not on file    Attends Banker Meetings: 1 to 4 times per year     Marital Status: Married  Catering manager Violence: Not At Risk (06/01/2023)   Humiliation, Afraid, Rape, and Kick questionnaire    Fear of Current or Ex-Partner: No  Emotionally Abused: No    Physically Abused: No    Sexually Abused: No    Review of Systems: ROS negative except for what is noted on the assessment and plan.  Vitals:   11/02/23 1037 11/02/23 1050 11/02/23 1052 11/02/23 1132  BP: (!) 108/39 (!) 97/44 (!) 99/48 (!) 113/50  Pulse: 86 86 84 72  Temp:      TempSrc:      SpO2:      Weight:      Height:        Physical Exam: Constitutional: well-appearing woman , sitting in chair , in no acute distress Cardiovascular: RRR Pulmonary/Chest: normal work of breathing on room air, lungs clear to auscultation bilaterally MSK: No LE edema noted Skin: warm and dry  Assessment & Plan:   Hypotension [name] presented to clinic today for a routine follow-up. Her vital signs were notable for a blood pressure of 99/48 mmHg, with a MAP of 65. Despite this, she remained asymptomatic. She reports good adherence to her antihypertensive regimen and denies any recent changes, including doubling her medication dose.She was afebrile on exam and exhibited no signs of infection. She specifically denied symptoms such as cough, dysuria, or shortness of breath that might suggest sepsis. She also reported eating breakfast prior to her visit and denied experiencing dizziness or palpitations.Given this new finding of decreased blood pressure, I am concerned her BP may drop further if she continues taking Lisinopril , even though I do not believe it is the direct cause of her hypotension. As a precaution, I will hold her Lisinopril  and schedule a follow-up nurse visit next week to recheck her blood pressure and adjust her medications if needed. - Hold Lisinopril  - BMP to assess good perfusion to her kidneys - Return for a BP check next week - Consider resuming lisinopril  if blood pressure returns  to her baseline - If her blood pressure continues to be low despite encouraging p.o. intake and holding lisinopril , consider admitting for other etiologies of hypotension like adrenal insufficiency verse neurogenic verse myocardial dysfunction  Diabetic autonomic neuropathy associated with type 2 diabetes mellitus (HCC) The patient has a known history of peripheral neuropathy and was started on duloxetine  during her last visit a few months ago. She reports improvement in her symptoms since initiating the medication. Recent TSH and vitamin B12 levels were within normal limits. The neuropathy is most likely secondary to her diabetes. -Continue duloxetine  30 mg daily  Type 2 diabetes mellitus with renal manifestations (HCC) Patient has a history of diabetes currently being managed with Trulicity  and Jardiance .  Her last hemoglobin A1c was at goal of 6.3 about 3 months ago.  Fortunately repeat hemoglobin A1c today is 7.5.  She however reports good adherence to her home regimen.  She denies any polydipsia or polyuria at this time.  Discussed the importance of medication adherence and complications of uncontrolled diabetes. -Will continue Trulicity  1.5 mg weekly    Patient discussed with Dr. Lovie Drue Grow, M.D [facility] Phone: [phone] Date 11/02/2023 Time 1:15 PM

## 2023-11-02 NOTE — Assessment & Plan Note (Signed)
 The patient has a known history of peripheral neuropathy and was started on duloxetine  during her last visit a few months ago. She reports improvement in her symptoms since initiating the medication. Recent TSH and vitamin B12 levels were within normal limits. The neuropathy is most likely secondary to her diabetes. -Continue duloxetine  30 mg daily

## 2023-11-02 NOTE — Progress Notes (Signed)
 Internal Medicine Clinic Attending  Case discussed with the resident at the time of the visit.  We reviewed the resident's history and exam and pertinent patient test results.  I agree with the assessment, diagnosis, and plan of care documented in the resident's note.    Patient's blood pressure is low today. She is on Lisinopril . No evidence of sepsis and she has been eating & drinking normally.  We will suspend the Lisinopril  for now.  Will check BMP. Encourage hydration. RN BP follow up in 1 week to see how she's doing off the Lisinopril , which she takes for HTN

## 2023-11-02 NOTE — Assessment & Plan Note (Addendum)
 Ms. Hehr presented to clinic today for a routine follow-up. Her vital signs were notable for a blood pressure of 99/48 mmHg, with a MAP of 65. Despite this, she remained asymptomatic. She reports good adherence to her antihypertensive regimen and denies any recent changes, including doubling her medication dose.She was afebrile on exam and exhibited no signs of infection. She specifically denied symptoms such as cough, dysuria, or shortness of breath that might suggest sepsis. She also reported eating breakfast prior to her visit and denied experiencing dizziness or palpitations.Given this new finding of decreased blood pressure, I am concerned her BP may drop further if she continues taking Lisinopril , even though I do not believe it is the direct cause of her hypotension. As a precaution, I will hold her Lisinopril  and schedule a follow-up nurse visit next week to recheck her blood pressure and adjust her medications if needed. - Hold Lisinopril  - BMP to assess good perfusion to her kidneys - Return for a BP check next week - Consider resuming lisinopril  if blood pressure returns to her baseline - If her blood pressure continues to be low despite encouraging p.o. intake and holding lisinopril , consider admitting for other etiologies of hypotension like adrenal insufficiency verse neurogenic verse myocardial dysfunction

## 2023-11-03 ENCOUNTER — Ambulatory Visit: Payer: Self-pay | Admitting: Student

## 2023-11-03 ENCOUNTER — Other Ambulatory Visit: Payer: Self-pay | Admitting: Student

## 2023-11-03 DIAGNOSIS — N179 Acute kidney failure, unspecified: Secondary | ICD-10-CM

## 2023-11-03 LAB — BASIC METABOLIC PANEL WITH GFR
BUN/Creatinine Ratio: 19 (ref 12–28)
BUN: 23 mg/dL (ref 8–27)
CO2: 24 mmol/L (ref 20–29)
Calcium: 9.8 mg/dL (ref 8.7–10.3)
Chloride: 98 mmol/L (ref 96–106)
Creatinine, Ser: 1.24 mg/dL — ABNORMAL HIGH (ref 0.57–1.00)
Glucose: 148 mg/dL — ABNORMAL HIGH (ref 70–99)
Potassium: 3.3 mmol/L — ABNORMAL LOW (ref 3.5–5.2)
Sodium: 140 mmol/L (ref 134–144)
eGFR: 45 mL/min/{1.73_m2} — ABNORMAL LOW (ref >59)

## 2023-11-04 ENCOUNTER — Other Ambulatory Visit: Payer: Self-pay | Admitting: Student

## 2023-11-04 NOTE — Telephone Encounter (Signed)
 [name] returned my call this morning. I confirmed her identity through a two-factor verification process, validating her date of birth and address. We discussed her recent BMP results, and I recommended she increase her fluid intake. She was advised to return to the clinic tomorrow morning for a follow-up BMP. The patient acknowledged the risks of acute kidney injury and the potential progression to chronic kidney disease if not addressed promptly. She committed to drinking plenty of water  today and to attending the clinic tomorrow for the [facility] recheck. A repeat BMP order for tomorrow morning has been placed.  Jillian Lisa Grow MD 11/04/2023, 1:27 PM

## 2023-11-05 ENCOUNTER — Other Ambulatory Visit

## 2023-11-05 DIAGNOSIS — N179 Acute kidney failure, unspecified: Secondary | ICD-10-CM

## 2023-11-06 LAB — BASIC METABOLIC PANEL WITH GFR
BUN/Creatinine Ratio: 16 (ref 12–28)
BUN: 18 mg/dL (ref 8–27)
CO2: 26 mmol/L (ref 20–29)
Calcium: 9.6 mg/dL (ref 8.7–10.3)
Chloride: 97 mmol/L (ref 96–106)
Creatinine, Ser: 1.12 mg/dL — ABNORMAL HIGH (ref 0.57–1.00)
Glucose: 189 mg/dL — ABNORMAL HIGH (ref 70–99)
Potassium: 3.5 mmol/L (ref 3.5–5.2)
Sodium: 142 mmol/L (ref 134–144)
eGFR: 51 mL/min/1.73 — ABNORMAL LOW (ref >59)

## 2023-11-12 ENCOUNTER — Ambulatory Visit: Payer: Self-pay | Admitting: Student

## 2023-12-03 NOTE — Telephone Encounter (Signed)
 Pt seen by [facility]  Done by Dr. Delories at [facility]  09/25/2022  Per Prev notes in Under the Health maintenance-  Will reroute Referral due to office no longer accepting her Ins Plan   1 yr ago (09/25/22)   Copied from CRM #[id number]. Topic: General - Other >> Dec 03, 2023 12:41 PM Adrianna P wrote: Reason for CRM: Savannah from [facility] called, they do not accept the patient's insurance, so it will need to be sent to another office that is in network with patient's insurance

## 2023-12-09 ENCOUNTER — Other Ambulatory Visit (HOSPITAL_COMMUNITY): Payer: Self-pay

## 2023-12-21 ENCOUNTER — Other Ambulatory Visit: Payer: Self-pay

## 2023-12-21 DIAGNOSIS — I152 Hypertension secondary to endocrine disorders: Secondary | ICD-10-CM

## 2023-12-21 DIAGNOSIS — E1121 Type 2 diabetes mellitus with diabetic nephropathy: Secondary | ICD-10-CM

## 2023-12-21 DIAGNOSIS — E785 Hyperlipidemia, unspecified: Secondary | ICD-10-CM

## 2023-12-21 MED ORDER — ATORVASTATIN CALCIUM 40 MG PO TABS: 40.0000 mg | ORAL_TABLET | Freq: Every day | ORAL | 1 refills | Status: AC

## 2023-12-21 MED ORDER — LISINOPRIL 20 MG PO TABS: 20.0000 mg | ORAL_TABLET | Freq: Every day | ORAL | 2 refills | Status: AC

## 2023-12-21 NOTE — Telephone Encounter (Signed)
 Medication sent to pharmacy

## 2023-12-25 DIAGNOSIS — E1136 Type 2 diabetes mellitus with diabetic cataract: Secondary | ICD-10-CM | POA: Diagnosis not present

## 2023-12-25 DIAGNOSIS — H25812 Combined forms of age-related cataract, left eye: Secondary | ICD-10-CM | POA: Diagnosis not present

## 2023-12-25 DIAGNOSIS — I10 Essential (primary) hypertension: Secondary | ICD-10-CM | POA: Diagnosis not present

## 2024-01-06 ENCOUNTER — Telehealth: Payer: Self-pay | Admitting: *Deleted

## 2024-01-06 ENCOUNTER — Other Ambulatory Visit (HOSPITAL_COMMUNITY): Payer: Self-pay

## 2024-01-06 NOTE — Telephone Encounter (Signed)
 Pt called in for a refill on Chlorthalidone ; pt was told it was discontinued at 07/03/23 OV. Pt also stated she needs a new glucose meter; she's having trouble getting the test strips for the Accu-chek Guide. I will ask Arland SQUIBB for her suggestion on which meter is better for the pt.

## 2024-01-06 NOTE — Telephone Encounter (Signed)
 Left a message for patient about her options and to call us  if she wants us  to send [facility] a prescription for a new meter- am chekcing with Lavern to see if [facility] now prefers contour next.

## 2024-01-06 NOTE — Telephone Encounter (Signed)
 Disregard previous meters, [name] says insurance prefers Accu check Guide meter or Contour Plus BLUE meter and contour plus strips.

## 2024-01-06 NOTE — Telephone Encounter (Signed)
 She can try a different pharmacy or change to One Touch Verio Flex meter with One touch Verio strips and Delica lancets. I plan to call her in a few minutes.

## 2024-01-07 NOTE — Telephone Encounter (Signed)
 Pt stated she would like a different meter. Informed pt it will be a Contour Plus Blue Meter and test strips. Sending to pt's PCP.

## 2024-01-07 NOTE — Telephone Encounter (Signed)
 Called pt - left message if she wants to switch to a different type of meter or keep the one she has (Accu-chek Guide) and refill her test strips.

## 2024-01-07 NOTE — Telephone Encounter (Signed)
Called pt - no answer nor vm, unable to leave a message.

## 2024-01-07 NOTE — Addendum Note (Signed)
 Addended by: CAMMIE GAETANA DEL on: 01/07/2024 01:57 PM   Modules accepted: Orders

## 2024-01-07 NOTE — Telephone Encounter (Signed)
 Forwarded message to the Triage Nurses  Copied from CRM [id number]. Topic: General - Call Back - No Documentation >> Jan 07, 2024 12:29 PM Brittney F wrote: Reason for CRM:   Patient is calling back Nurse Glenda after a missed call.   Patient called during lunch hour, please inform clinical staff.  Callback Number: [phone]

## 2024-01-08 ENCOUNTER — Other Ambulatory Visit: Payer: Self-pay | Admitting: Student

## 2024-01-11 ENCOUNTER — Other Ambulatory Visit: Payer: Self-pay | Admitting: Student

## 2024-01-11 MED ORDER — CONTOUR PLUS BLUE W/DEVICE KIT: 1.0000 | PACK | 2 refills | Status: AC

## 2024-01-11 NOTE — Telephone Encounter (Signed)
 Pt stated she wants to switch to the Contour Plus Blue meter and test strips.

## 2024-01-20 ENCOUNTER — Ambulatory Visit (HOSPITAL_COMMUNITY)
Admission: EM | Admit: 2024-01-20 | Discharge: 2024-01-20 | Disposition: A | Discharge status: Home / Self Care | Attending: Internal Medicine | Admitting: Internal Medicine

## 2024-01-20 ENCOUNTER — Encounter (HOSPITAL_COMMUNITY): Payer: Self-pay | Admitting: Emergency Medicine

## 2024-01-20 ENCOUNTER — Ambulatory Visit (INDEPENDENT_AMBULATORY_CARE_PROVIDER_SITE_OTHER)

## 2024-01-20 DIAGNOSIS — R0602 Shortness of breath: Secondary | ICD-10-CM | POA: Diagnosis not present

## 2024-01-20 DIAGNOSIS — B309 Viral conjunctivitis, unspecified: Secondary | ICD-10-CM | POA: Diagnosis not present

## 2024-01-20 DIAGNOSIS — R051 Acute cough: Secondary | ICD-10-CM | POA: Diagnosis not present

## 2024-01-20 DIAGNOSIS — J209 Acute bronchitis, unspecified: Secondary | ICD-10-CM | POA: Diagnosis not present

## 2024-01-20 MED ORDER — GUAIFENESIN ER 600 MG PO TB12: 600.0000 mg | ORAL_TABLET | Freq: Two times a day (BID) | ORAL | 0 refills | Status: AC

## 2024-01-20 MED ORDER — ALBUTEROL SULFATE HFA 108 (90 BASE) MCG/ACT IN AERS
2.0000 | INHALATION_SPRAY | Freq: Once | RESPIRATORY_TRACT | Status: AC
  Administered 2024-01-20: 2 via RESPIRATORY_TRACT

## 2024-01-20 MED ORDER — OLOPATADINE HCL 0.2 % OP SOLN: 1.0000 [drp] | Freq: Every day | OPHTHALMIC | 0 refills | Status: AC | PRN

## 2024-01-20 MED ORDER — IPRATROPIUM-ALBUTEROL 0.5-2.5 (3) MG/3ML IN SOLN
RESPIRATORY_TRACT | Status: AC
  Filled 2024-01-20: qty 3

## 2024-01-20 MED ORDER — DEXAMETHASONE 10 MG/ML FOR PEDIATRIC ORAL USE
INTRAMUSCULAR | Status: AC
  Filled 2024-01-20: qty 1

## 2024-01-20 MED ORDER — ALBUTEROL SULFATE HFA 108 (90 BASE) MCG/ACT IN AERS
INHALATION_SPRAY | RESPIRATORY_TRACT | Status: AC
  Filled 2024-01-20: qty 6.7

## 2024-01-20 MED ORDER — IPRATROPIUM-ALBUTEROL 0.5-2.5 (3) MG/3ML IN SOLN
3.0000 mL | Freq: Once | RESPIRATORY_TRACT | Status: AC
  Administered 2024-01-20: 3 mL via RESPIRATORY_TRACT

## 2024-01-20 MED ORDER — BENZONATATE 100 MG PO CAPS: 100.0000 mg | ORAL_CAPSULE | Freq: Three times a day (TID) | ORAL | 0 refills | Status: AC

## 2024-01-20 MED ORDER — DEXAMETHASONE SODIUM PHOSPHATE 10 MG/ML IJ SOLN
10.0000 mg | Freq: Once | INTRAMUSCULAR | Status: AC
  Administered 2024-01-20: 10 mg via INTRAMUSCULAR

## 2024-01-20 MED ORDER — PROMETHAZINE-DM 6.25-15 MG/5ML PO SYRP: 2.5000 mL | ORAL_SOLUTION | Freq: Every evening | ORAL | 0 refills | Status: AC | PRN

## 2024-01-20 NOTE — Discharge Instructions (Signed)
 You have bronchitis which is inflammation of the upper airways in your lungs due to a virus.   We gave you an injection of steroid in the clinic today to jumpstart treatment of inflammation to the lungs.   Use albuterol  inhaler 2 puffs every 4-6 hours on a schedule for the next 24 hours while the steroid kicks in, then as needed for cough, shortness of breath, and wheezing.   Use guaifenesin  (plain mucinex ) to break up congestion in nose/chest so that you are able to excrete easier. Drink plenty of fluids to stay well hydrated while taking mucinex  so that it works well in the body.   Promethazine  DM cough syrup may be used at bedtime for coughing, this medicine will cause you to be sleepy so only take at bedtime.   Tessalon  perles every 8 hours as needed for coughing.   If you develop any new or worsening symptoms or if your symptoms do not start to improve, please return here or follow-up with your primary care provider. If your symptoms are severe, please go to the emergency room.

## 2024-01-20 NOTE — ED Triage Notes (Signed)
 Pt c/o headache, nasal drainage and nonproductive cough x's 1 week   Pt st's she continues to feel worse

## 2024-01-20 NOTE — ED Provider Notes (Signed)
 [facility]    CSN: [id number] Arrival date & time: 01/20/24  9195      History   Chief Complaint Chief Complaint  Patient presents with   Cough   Headache    HPI [name] is a 78 y.o. female.   [name] is a 78 y.o. female presenting for chief complaint of cough, congestion, generalized headache, generalized fatigue, and watery/itchy eyes that started 1 week ago.  Cough is dry, nonproductive, and worse at nighttime.  Cough is frequent.  Over the last 24 to 48 hours, she has developed a little bit of shortness of breath.  Reports bilateral chest discomfort associated with coughing.  Both eyes are red, a little bit itchy, and with watery drainage.  Denies history of seasonal allergies and recent sick contacts with similar symptoms.  Never smoker, denies drug use.  Denies history of secondhand smoke exposure.  No history of asthma or COPD.  Denies recent antibiotic or steroid use in the last 90 days.  Denies known fever, chills, nausea, vomiting, diarrhea, abdominal pain, and rash. History of type 2 diabetes, most recent hemoglobin A1c 7.3. She is taking Tylenol  at home with some relief of headache.   Cough Associated symptoms: headaches   Headache Associated symptoms: cough     Past Medical History:  Diagnosis Date   Allergy    Anemia    Bilateral carpal tunnel syndrome 06/29/2018   Breast CA (HCC)    (Rt) breast ca dx 4/12---  S/P RADICAL MASTECTOMY AND CHEMORADIATION   Diabetes mellitus ORAL MED   Fatty liver disease, nonalcoholic    History of hyperkalemia    Hx antineoplastic chemo  10/12 - 06/24/11   PACLITAX EL COMPLETED 06/24/11   Hyperlipidemia    Hypertension    Mild nonproliferative diabetic retinopathy of right eye (HCC) 03/19/2011   Dr. 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   Primary osteoarthritis of left knee 01/25/2019   Cervical neuropathy 08/20/2018   Constipation 01/15/2016   Low back pain 12/20/2014   Osteopenia 12/13/2013   Vaginal discharge 12/13/2013   Diabetic autonomic neuropathy associated with type 2 diabetes mellitus (HCC) 11/02/2013   Health care maintenance 11/17/2012   Breast cancer, right breast (HCC) 09/25/2010   History of colonic polyps 07/19/2007   OBESITY NOS 07/09/2006   Type 2 diabetes mellitus with renal manifestations (HCC) 03/10/2006   Hyperlipidemia 03/10/2006   Hypertension associated with diabetes (HCC) 03/10/2006    Past Surgical History:  Procedure Laterality Date   CARPAL TUNNEL RELEASE  06/11/2023   COLONOSCOPY W/ POLYPECTOMY     Hx; of   I & D KNEE WITH POLY EXCHANGE Right 09/03/2012   Procedure: IRRIGATION AND DEBRIDEMENT RIGHT KNEE WITH POLY EXCHANGE;  Surgeon: Dempsey JINNY Sensor, MD;  Location: [facility];  Service: Orthopedics;  Laterality: Right;   JOINT REPLACEMENT     knee arthroscopy  10/28/1999   RIGHT   Laparoscopy with laparoscopic right salpingo oophorectomy and lysis of pelvic and abdominal  adhesions  07/2011   Dr. Rosalynn    LEFT TOTAL KNEE ARTHROPLASTY (Left Knee)  07/30/2020   MASTECTOMY MODIFIED RADICAL Left 12/04/2010   W/ LEFT PAC PLACEMENT (RIGHT BREAST W/ AXILLARY CONTENTS/ NODE BX'S   PORT-A-CATH REMOVAL  09/24/2011   Procedure: REMOVAL PORT-A-CATH;  Surgeon: Debby LABOR. Cornett, MD;  Location: [facility];  Service: General;  Laterality: N/A;   PORTACATH PLACEMENT     REPLACED PAC DUE TO MALFUNCTION (LEFT)   TOTAL KNEE ARTHROPLASTY Right 08/16/2012   Procedure: TOTAL KNEE ARTHROPLASTY;  Surgeon: Dempsey JINNY Sensor, MD;  Location: [facility];  Service: Orthopedics;   Laterality: Right;   TOTAL KNEE ARTHROPLASTY Left 07/30/2020   Procedure: LEFT TOTAL KNEE ARTHROPLASTY;  Surgeon: Jerri Kay HERO, MD;  Location: [facility];  Service: Orthopedics;  Laterality: Left;   TRANSTHORACIC ECHOCARDIOGRAM  10/29/2010   NORMAL LVF/ EF 55-60%/ MILD MV REGURG   TUBAL LIGATION  YRS AGO   VAGINAL HYSTERECTOMY  AGE 58   W/ LSO    OB History     Gravida  4   Para  4   Term  4   Preterm      AB      Living  4      SAB      IAB      Ectopic      Multiple      Live Births               Home Medications    Prior to Admission medications   Medication Sig Start Date End Date Taking? Authorizing Provider  benzonatate  (TESSALON ) 100 MG capsule Take 1 capsule (100 mg total) by mouth every 8 (eight) hours. 01/20/24  Yes Enedelia Dorna HERO, FNP  guaiFENesin  (MUCINEX ) 600 MG 12 hr tablet Take 1 tablet (600 mg total) by mouth 2 (two) times daily. 01/20/24  Yes Enedelia Dorna HERO, FNP  Olopatadine  HCl 0.2 % SOLN Apply 1 drop to eye daily as needed. 01/20/24  Yes Enedelia Dorna HERO, FNP  promethazine -dextromethorphan (PROMETHAZINE -DM) 6.25-15 MG/5ML syrup Take 2.5 mLs by mouth at bedtime as needed for cough. 01/20/24  Yes Enedelia Dorna HERO, FNP  Accu-Chek Softclix Lancets lancets Check blood sugar up to 7 times per week 07/22/22   [name], Vasilios, MD  atorvastatin  (LIPITOR) 40 MG tablet Take 1 tablet (40 mg total) by mouth daily. 12/21/23   Amoako, Prince, MD  Blood Glucose Monitoring Suppl (CONTOUR PLUS BLUE) w/Device KIT 1 Box by Does not apply route See admin instructions. 01/11/24   Amoako, Prince, MD  cholecalciferol (VITAMIN D3) 25 MCG (1000 UNIT) tablet TAKE 2 TABLETS (2,000 UNITS TOTAL) BY MOUTH DAILY Patient not taking: Reported on 07/20/2023 06/28/23   Stephanie Freund, MD  cyclobenzaprine  (FLEXERIL ) 10 MG tablet Take 1 tablet (10 mg total) by mouth 2 (two) times daily as needed for muscle spasms. Patient not taking: Reported on 01/20/2024 01/16/23    Yolande Lamar BROCKS, MD  Dulaglutide  (TRULICITY ) 1.5 MG/0.5ML SOAJ Inject 1.5 mg into the skin once a week. 07/31/23   Stephanie Freund, MD  DULoxetine  (CYMBALTA ) 30 MG capsule TAKE 1 CAPSULE BY MOUTH EVERY DAY Patient not taking: Reported on 01/20/2024 08/10/23   Stephanie Freund, MD  empagliflozin  (JARDIANCE ) 10 MG TABS tablet Take 1 tablet (10 mg total) by mouth daily before breakfast. 12/29/22   Stephanie Freund, MD  ezetimibe  (ZETIA ) 10 MG tablet Take 1 tablet (10 mg total) by mouth daily. 05/02/23 05/01/24  Fernand Prost, MD  fluticasone  (  FLONASE ) 50 MCG/ACT nasal spray Place 1 spray into both nostrils daily. 12/11/21   Atway, Rayann N, DO  glucose blood (ACCU-CHEK GUIDE) test strip Check blood sugar up to 7 times per week 07/22/22   [name], Vasilios, MD  HYDROcodone -acetaminophen  (NORCO) 5-325 MG tablet Take 1 tablet by mouth 3 (three) times daily as needed. To be taken after surgery Patient not taking: Reported on 01/20/2024 06/08/23   Jule Ronal CROME, PA-C  lisinopril  (ZESTRIL ) 20 MG tablet Take 1 tablet (20 mg total) by mouth daily. 12/21/23   Amoako, Prince, MD  naproxen  (NAPROSYN ) 500 MG tablet Take 1 tablet (500 mg total) by mouth 2 (two) times daily. Patient not taking: Reported on 01/20/2024 07/07/23   Ruthell Lonni FALCON, PA-C  ondansetron  (ZOFRAN ) 4 MG tablet Take 1 tablet (4 mg total) by mouth every 8 (eight) hours as needed for nausea or vomiting. 06/08/23   Jule Ronal CROME, PA-C  polyethylene glycol (MIRALAX ) 17 g packet Take 17 g by mouth daily. 09/05/21   Ariwodo, Shirley, MD  psyllium (METAMUCIL) 58.6 % packet Take 1 packet by mouth daily. 07/31/23   Stephanie Freund, MD  senna-docusate (SENOKOT-S) 8.6-50 MG tablet Take 1 tablet by mouth daily. 07/20/23   Jha, Panav, MD  tiZANidine  (ZANAFLEX ) 4 MG tablet Take 1 tablet (4 mg total) by mouth every 6 (six) hours as needed for muscle spasms. Patient not taking: Reported on 01/20/2024 07/07/23   Ruthell Lonni FALCON, PA-C    Family History Family  History  Problem Relation Age of Onset   Stroke Father    Breast cancer Sister    Cancer Sister        breast   Cancer Sister        COLON CANCER   Stroke Sister    Heart disease Brother     Social History Social History   Tobacco Use   Smoking status: Never   Smokeless tobacco: Never  Vaping Use   Vaping status: Never Used  Substance Use Topics   Alcohol  use: No    Alcohol /week: 0.0 standard drinks of alcohol    Drug use: No     Allergies   Hydrochlorothiazide    Review of Systems Review of Systems  Respiratory:  Positive for cough.   Neurological:  Positive for headaches.  Per HPI   Physical Exam Triage Vital Signs ED Triage Vitals  Encounter Vitals Group     BP 01/20/24 0820 (!) 150/76     Girls Systolic BP Percentile --      Girls Diastolic BP Percentile --      Boys Systolic BP Percentile --      Boys Diastolic BP Percentile --      Pulse Rate 01/20/24 0820 69     Resp 01/20/24 0820 20     Temp 01/20/24 0820 98.1 F (36.7 C)     Temp Source 01/20/24 0820 Oral     SpO2 01/20/24 0820 96 %     Weight --      Height --      Head Circumference --      Peak Flow --      Pain Score 01/20/24 0822 0     Pain Loc --      Pain Education --      Exclude from Growth Chart --    No data found.  Updated Vital Signs BP (!) 150/76 (BP Location: Left Arm)   Pulse 69   Temp 98.1 F (36.7 C) (Oral)  Resp 20   SpO2 96%   Visual Acuity Right Eye Distance:   Left Eye Distance:   Bilateral Distance:    Right Eye Near:   Left Eye Near:    Bilateral Near:     Physical Exam Vitals and nursing note reviewed.  Constitutional:      Appearance: She is not ill-appearing or toxic-appearing.  HENT:     Head: Normocephalic and atraumatic.     Right Ear: Hearing, tympanic membrane, ear canal and external ear normal.     Left Ear: Hearing, tympanic membrane, ear canal and external ear normal.     Nose: Rhinorrhea present.     Mouth/Throat:     Lips: Pink.      Mouth: Mucous membranes are moist. No injury or oral lesions.     Dentition: Normal dentition.     Tongue: No lesions.     Pharynx: Oropharynx is clear. Uvula midline. No pharyngeal swelling, oropharyngeal exudate, posterior oropharyngeal erythema, uvula swelling or postnasal drip.     Tonsils: No tonsillar exudate.  Eyes:     General: Lids are normal. Vision grossly intact. Gaze aligned appropriately.        Right eye: No foreign body, discharge or hordeolum.        Left eye: No foreign body, discharge or hordeolum.     Extraocular Movements: Extraocular movements intact.     Conjunctiva/sclera:     Right eye: Right conjunctiva is injected.     Left eye: Left conjunctiva is injected.  Neck:     Trachea: Trachea and phonation normal.  Cardiovascular:     Rate and Rhythm: Normal rate and regular rhythm.     Heart sounds: Normal heart sounds, S1 normal and S2 normal.  Pulmonary:     Effort: Pulmonary effort is normal. No respiratory distress.     Breath sounds: Normal breath sounds and air entry. No wheezing, rhonchi or rales.     Comments: Harsh and dry cough on exam with deep inspiration. Faint rhonchi to the right middle  lobe. Speaking in full sentences without difficulty.  Chest:     Chest wall: No tenderness.  Musculoskeletal:     Cervical back: Neck supple.  Lymphadenopathy:     Cervical: No cervical adenopathy.  Skin:    General: Skin is warm and dry.     Capillary Refill: Capillary refill takes less than 2 seconds.     Findings: No rash.  Neurological:     General: No focal deficit present.     Mental Status: She is alert and oriented to person, place, and time. Mental status is at baseline.     Cranial Nerves: No dysarthria or facial asymmetry.  Psychiatric:        Mood and Affect: Mood normal.        Speech: Speech normal.        Behavior: Behavior normal.        Thought Content: Thought content normal.        Judgment: Judgment normal.      UC Treatments /  Results  Labs (all labs ordered are listed, but only abnormal results are displayed) Labs Reviewed - No data to display  EKG   Radiology DG Chest 2 View Result Date: 01/20/2024 CLINICAL DATA:  Coughing congestion with shortness of breath. EXAM: CHEST - 2 VIEW COMPARISON:  07/07/2023 FINDINGS: Cardiopericardial silhouette is at upper limits of normal for size. The lungs are clear without focal pneumonia, edema, pneumothorax or pleural  effusion. Surgical clips are seen over the right lateral chest wall. No acute bony abnormality. IMPRESSION: Stable.  No active cardiopulmonary disease. Electronically Signed   By: Camellia Candle M.D.   On: 01/20/2024 09:34    Procedures Procedures (including critical care time)  Medications Ordered in UC Medications  ipratropium-albuterol  (DUONEB) 0.5-2.5 (3) MG/3ML nebulizer solution 3 mL (3 mLs Nebulization Given 01/20/24 0849)  albuterol  (VENTOLIN  HFA) 108 (90 Base) MCG/ACT inhaler 2 puff (2 puffs Inhalation Given 01/20/24 0933)  dexamethasone  (DECADRON ) injection 10 mg (10 mg Intramuscular Given 01/20/24 0934)    Initial Impression / Assessment and Plan / UC Course  I have reviewed the triage vital signs and the nursing notes.  Pertinent labs & imaging results that were available during my care of the patient were reviewed by me and considered in my medical decision making (see chart for details).   1.  Acute cough, acute bronchitis, shortness of breath Presentation consistent with acute viral bronchitis. Deferred viral given delayed presentation to clinic and timing of illness.  Duoneb nebulizer treatment given with significant symptomatic improvement reported by patient and lung sounds on reassessment.   Chest x-ray is unremarkable for signs of pneumonia/acute cardiopulmonary abnormality.  Treatment plan:  Dexamethasone  10 mg IM steroid given today.  History of osteopenia, therefore I would like to avoid oral steroid.  Will treat with PRN use of  albuterol , mucinex , and antitussive  at bedtime.  Olopatadine  eyedrops as needed for allergic/viral conjunctivitis.  Counseled patient on potential for adverse effects with medications prescribed/recommended today, strict ER and return-to-clinic precautions discussed, patient verbalized understanding.    Final Clinical Impressions(s) / UC Diagnoses   Final diagnoses:  Acute cough  Acute bronchitis, unspecified organism  Shortness of breath  Viral conjunctivitis of both eyes     Discharge Instructions      You have bronchitis which is inflammation of the upper airways in your lungs due to a virus.   We gave you an injection of steroid in the clinic today to jumpstart treatment of inflammation to the lungs.   Use albuterol  inhaler 2 puffs every 4-6 hours on a schedule for the next 24 hours while the steroid kicks in, then as needed for cough, shortness of breath, and wheezing.   Use guaifenesin  (plain mucinex ) to break up congestion in nose/chest so that you are able to excrete easier. Drink plenty of fluids to stay well hydrated while taking mucinex  so that it works well in the body.   Promethazine  DM cough syrup may be used at bedtime for coughing, this medicine will cause you to be sleepy so only take at bedtime.   Tessalon  perles every 8 hours as needed for coughing.   If you develop any new or worsening symptoms or if your symptoms do not start to improve, please return here or follow-up with your primary care provider. If your symptoms are severe, please go to the emergency room.      ED Prescriptions     Medication Sig Dispense Auth. Provider   promethazine -dextromethorphan (PROMETHAZINE -DM) 6.25-15 MG/5ML syrup Take 2.5 mLs by mouth at bedtime as needed for cough. 60 mL Enedelia Going M, FNP   guaiFENesin  (MUCINEX ) 600 MG 12 hr tablet Take 1 tablet (600 mg total) by mouth 2 (two) times daily. 30 tablet Amaar Oshita M, FNP   benzonatate  (TESSALON ) 100 MG  capsule Take 1 capsule (100 mg total) by mouth every 8 (eight) hours. 21 capsule Enedelia Going HERO, FNP   Olopatadine  HCl 0.2 %  SOLN Apply 1 drop to eye daily as needed. 2.5 mL Enedelia Dorna HERO, FNP      PDMP not reviewed this encounter.   Enedelia Dorna [place], [place] 01/20/24 [phone]

## 2024-01-20 NOTE — Medical Student Note (Addendum)
 [facility] Insurance account manager Note For educational purposes for Medical, PA and NP students only and not part of the legal medical record.   CSN: [id number] Arrival date & time: 01/20/24  9195      History   Chief Complaint Chief Complaint  Patient presents with   Cough   Headache    HPI [name] is a 78 y.o. female.  Pt with a hx of DM2, HLD, anemia and is here at the urging of her husband with concerns because she is scheduled for cataract removal later in the week. She presents with 1 week of rhinorrhea, watery eyes, HA, and a non-productive cough. The cough is keeping her up at night. This morning she had a brief episode of ShOB after eating breakfast. She denies any overt CP, but does have some chest discomfort with her cough. She has only taken APAP once daily in the evening with some relief of symptoms. She denies any fevers or chills.    Cough Associated symptoms: eye discharge, headaches, rhinorrhea and shortness of breath   Associated symptoms: no chest pain, no chills, no ear pain, no fever, no myalgias and no sore throat   Headache Associated symptoms: congestion, cough and sinus pressure   Associated symptoms: no abdominal pain, no diarrhea, no ear pain, no fever, no myalgias, no nausea, no sore throat and no vomiting     Past Medical History:  Diagnosis Date   Allergy    Anemia    Bilateral carpal tunnel syndrome 06/29/2018   Breast CA (HCC)    (Rt) breast ca dx 4/12---  S/P RADICAL MASTECTOMY AND CHEMORADIATION   Diabetes mellitus ORAL MED   Fatty liver disease, nonalcoholic    History of hyperkalemia    Hx antineoplastic chemo  10/12 - 06/24/11   PACLITAX EL COMPLETED 06/24/11   Hyperlipidemia    Hypertension    Mild nonproliferative diabetic retinopathy of right eye (HCC) 03/19/2011   Dr. 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   Primary osteoarthritis of left knee 01/25/2019   Cervical neuropathy 08/20/2018   Constipation 01/15/2016   Low back pain 12/20/2014   Osteopenia 12/13/2013   Vaginal discharge 12/13/2013   Diabetic autonomic neuropathy associated with type 2 diabetes mellitus (HCC) 11/02/2013   Health care maintenance 11/17/2012   Breast cancer, right breast (HCC) 09/25/2010   History of colonic polyps 07/19/2007   OBESITY NOS 07/09/2006   Type 2 diabetes mellitus with renal manifestations (HCC) 03/10/2006   Hyperlipidemia 03/10/2006   Hypertension associated with diabetes (HCC) 03/10/2006    Past Surgical History:  Procedure Laterality Date   CARPAL TUNNEL RELEASE  06/11/2023   COLONOSCOPY W/ POLYPECTOMY     Hx; of   I & D KNEE WITH POLY EXCHANGE Right 09/03/2012   Procedure: IRRIGATION AND DEBRIDEMENT RIGHT KNEE WITH POLY EXCHANGE;  Surgeon: Dempsey JINNY Sensor, MD;  Location: [facility] OR;  Service: Orthopedics;  Laterality: Right;   JOINT REPLACEMENT     knee arthroscopy  10/28/1999   RIGHT   Laparoscopy with laparoscopic right salpingo oophorectomy and lysis of pelvic and abdominal adhesions  07/2011   Dr. Rosalynn    LEFT TOTAL KNEE ARTHROPLASTY (Left Knee)  07/30/2020   MASTECTOMY MODIFIED RADICAL Left 12/04/2010   W/ LEFT PAC PLACEMENT (RIGHT BREAST W/ AXILLARY CONTENTS/ NODE BX'S   PORT-A-CATH REMOVAL  09/24/2011   Procedure: REMOVAL PORT-A-CATH;  Surgeon: Debby LABOR. Cornett, MD;  Location: [facility];  Service: General;  Laterality: N/A;   PORTACATH PLACEMENT     REPLACED PAC DUE TO MALFUNCTION (LEFT)   TOTAL KNEE ARTHROPLASTY Right 08/16/2012   Procedure: TOTAL KNEE ARTHROPLASTY;  Surgeon: Dempsey JINNY Sensor, MD;  Location: [facility] OR;  Service: Orthopedics;  Laterality: Right;   TOTAL KNEE ARTHROPLASTY Left 07/30/2020   Procedure: LEFT TOTAL KNEE ARTHROPLASTY;  Surgeon: Jerri Kay HERO, MD;  Location: [facility] OR;  Service: Orthopedics;  Laterality: Left;   TRANSTHORACIC ECHOCARDIOGRAM  10/29/2010   NORMAL LVF/ EF 55-60%/ MILD MV REGURG   TUBAL LIGATION  YRS AGO   VAGINAL HYSTERECTOMY  AGE 38   W/ LSO    OB History     Gravida  4   Para  4   Term  4   Preterm      AB      Living  4      SAB      IAB      Ectopic      Multiple      Live Births               Home Medications    Prior to Admission medications   Medication Sig Start Date End Date Taking? Authorizing Provider  Accu-Chek Softclix Lancets lancets Check blood sugar up to 7 times per week 07/22/22   [name], Vasilios, MD  atorvastatin  (LIPITOR) 40 MG tablet Take 1 tablet (40 mg total) by mouth daily. 12/21/23   Amoako, Prince, MD  Blood Glucose Monitoring Suppl (CONTOUR PLUS BLUE) w/Device KIT 1 Box by Does not apply route See admin instructions. 01/11/24   Amoako, Prince, MD  cholecalciferol (VITAMIN D3) 25 MCG (1000 UNIT) tablet TAKE 2 TABLETS (2,000 UNITS TOTAL) BY MOUTH DAILY Patient not taking: Reported on 07/20/2023 06/28/23   Stephanie Freund, MD  cyclobenzaprine  (FLEXERIL ) 10 MG tablet Take 1 tablet (10 mg total) by mouth 2 (two) times daily as needed for muscle spasms. Patient not taking: Reported on 01/20/2024 01/16/23   Yolande Lamar BROCKS, MD  Dulaglutide  (TRULICITY ) 1.5 MG/0.5ML SOAJ Inject 1.5 mg into the skin once a week. 07/31/23   Stephanie Freund, MD  DULoxetine  (CYMBALTA ) 30 MG capsule TAKE 1 CAPSULE BY MOUTH EVERY DAY Patient not taking: Reported on 01/20/2024 08/10/23   Stephanie Freund, MD  empagliflozin  (JARDIANCE ) 10 MG TABS tablet Take 1 tablet (10 mg total) by mouth daily before breakfast. 12/29/22   Stephanie Freund, MD  ezetimibe  (ZETIA ) 10 MG tablet Take 1 tablet (10 mg total) by mouth daily. 05/02/23  05/01/24  Fernand Prost, MD  fluticasone  (FLONASE ) 50 MCG/ACT nasal spray Place 1 spray into both nostrils daily. 12/11/21   Atway, Rayann N, DO  glucose blood (ACCU-CHEK GUIDE) test strip Check blood sugar up to 7 times per week 07/22/22   [name], Vasilios, MD  HYDROcodone -acetaminophen  (NORCO) 5-325 MG tablet Take 1 tablet by mouth 3 (three) times daily as needed. To be taken after surgery Patient not taking: Reported on 01/20/2024 06/08/23   Jule Ronal CROME, PA-C  lisinopril  (ZESTRIL )  20 MG tablet Take 1 tablet (20 mg total) by mouth daily. 12/21/23   Amoako, Prince, MD  naproxen  (NAPROSYN ) 500 MG tablet Take 1 tablet (500 mg total) by mouth 2 (two) times daily. Patient not taking: Reported on 01/20/2024 07/07/23   Ruthell Lonni FALCON, PA-C  ondansetron  (ZOFRAN ) 4 MG tablet Take 1 tablet (4 mg total) by mouth every 8 (eight) hours as needed for nausea or vomiting. 06/08/23   Jule Ronal CROME, PA-C  polyethylene glycol (MIRALAX ) 17 g packet Take 17 g by mouth daily. 09/05/21   Ariwodo, Shirley, MD  psyllium (METAMUCIL) 58.6 % packet Take 1 packet by mouth daily. 07/31/23   Stephanie Freund, MD  senna-docusate (SENOKOT-S) 8.6-50 MG tablet Take 1 tablet by mouth daily. 07/20/23   Jha, Panav, MD  tiZANidine  (ZANAFLEX ) 4 MG tablet Take 1 tablet (4 mg total) by mouth every 6 (six) hours as needed for muscle spasms. Patient not taking: Reported on 01/20/2024 07/07/23   Ruthell Lonni FALCON, PA-C    Family History Family History  Problem Relation Age of Onset   Stroke Father    Breast cancer Sister    Cancer Sister        breast   Cancer Sister        COLON CANCER   Stroke Sister    Heart disease Brother     Social History Social History   Tobacco Use   Smoking status: Never   Smokeless tobacco: Never  Vaping Use   Vaping status: Never Used  Substance Use Topics   Alcohol  use: No    Alcohol /week: 0.0 standard drinks of alcohol    Drug use: No     Allergies   Hydrochlorothiazide    Review of  Systems Review of Systems  Constitutional:  Negative for chills and fever.  HENT:  Positive for congestion, rhinorrhea and sinus pressure. Negative for ear pain and sore throat.   Eyes:  Positive for discharge.  Respiratory:  Positive for cough, chest tightness and shortness of breath.   Cardiovascular:  Negative for chest pain.  Gastrointestinal:  Negative for abdominal pain, diarrhea, nausea and vomiting.  Musculoskeletal:  Negative for arthralgias and myalgias.  Neurological:  Positive for headaches.     Physical Exam Updated Vital Signs BP (!) 150/76 (BP Location: Left Arm)   Pulse 69   Temp 98.1 F (36.7 C) (Oral)   Resp 20   SpO2 96%   Physical Exam Constitutional:      Appearance: She is not ill-appearing.  HENT:     Right Ear: Tympanic membrane is scarred.     Left Ear: Tympanic membrane is scarred.     Ears:     Comments: TM opaque with no erythema or effusion present.    Nose: Mucosal edema and rhinorrhea present.     Right Sinus: No maxillary sinus tenderness or frontal sinus tenderness.     Left Sinus: No maxillary sinus tenderness or frontal sinus tenderness.     Comments: Boggy, erythematous nasal mucosa    Mouth/Throat:     Mouth: Mucous membranes are moist.  Eyes:     Conjunctiva/sclera:     Right eye: Right conjunctiva is injected.     Left eye: Left conjunctiva is injected.     Pupils: Pupils are equal, round, and reactive to light.  Cardiovascular:     Rate and Rhythm: Normal rate and regular rhythm.     Pulses: Normal pulses.     Heart sounds: Normal heart sounds.  Pulmonary:  Effort: Pulmonary effort is normal.     Breath sounds: Examination of the right-middle field reveals rhonchi. Rhonchi present.  Abdominal:     General: Bowel sounds are normal.     Palpations: Abdomen is soft.  Skin:    General: Skin is warm and dry.  Neurological:     Mental Status: She is alert.      ED Treatments / Results  Labs (all labs ordered are listed,  but only abnormal results are displayed) Labs Reviewed - No data to display  EKG  Radiology No results found.  Procedures Procedures (including critical care time)  Medications Ordered in ED Medications - No data to display   Initial Impression / Assessment and Plan / ED Course  I have reviewed the triage vital signs and the nursing notes.  Pertinent labs & imaging results that were available during my care of the patient were reviewed by me and considered in my medical decision making (see chart for details).   Will order DuoNeb HHN to give in clinic for symptomatic relief and CXR to rule out pneumonia.  CXR was negative. Symptoms likely viral vs allergic. Will give Decadron  10 mg IM in the clinic. Will instruct fluticasone  nasal spray OTC 1-2 spary each nostril daily. Will start albuterol  MDI PRN for chest tightness and ShOB. Promethazine -DM 2.5 mL nightly PRN for cough, guaifenesin  600 mg BID, benzonatate  100 mg q 8 hrs PRN for cough. Olopatadine  0.2%  1 gtt each eye daily PRN for watery discharge. Pt to follow up with PCP. Strict return precautions given.       Final Clinical Impressions(s) / ED Diagnoses   Final diagnoses:  None    New Prescriptions New Prescriptions   No medications on file

## 2024-02-02 ENCOUNTER — Other Ambulatory Visit: Payer: Self-pay | Admitting: Student

## 2024-02-02 ENCOUNTER — Other Ambulatory Visit (HOSPITAL_COMMUNITY): Payer: Self-pay

## 2024-02-02 ENCOUNTER — Encounter: Payer: Self-pay | Admitting: Student

## 2024-02-02 ENCOUNTER — Ambulatory Visit: Admitting: Student

## 2024-02-02 VITALS — BP 153/77 | HR 80 | Temp 98.8°F | Wt 149.0 lb

## 2024-02-02 DIAGNOSIS — E1121 Type 2 diabetes mellitus with diabetic nephropathy: Secondary | ICD-10-CM

## 2024-02-02 DIAGNOSIS — E1159 Type 2 diabetes mellitus with other circulatory complications: Secondary | ICD-10-CM | POA: Diagnosis not present

## 2024-02-02 DIAGNOSIS — Z23 Encounter for immunization: Secondary | ICD-10-CM

## 2024-02-02 DIAGNOSIS — I152 Hypertension secondary to endocrine disorders: Secondary | ICD-10-CM

## 2024-02-02 LAB — POCT GLYCOSYLATED HEMOGLOBIN (HGB A1C): HbA1c, POC (controlled diabetic range): 7.3 % — AB (ref 0.0–7.0)

## 2024-02-02 LAB — GLUCOSE, CAPILLARY: Glucose-Capillary: 120 mg/dL — ABNORMAL HIGH (ref 70–99)

## 2024-02-02 MED ORDER — EMPAGLIFLOZIN 10 MG PO TABS
10.0000 mg | ORAL_TABLET | Freq: Every day | ORAL | 0 refills | Status: DC
  Filled 2024-02-02: qty 90, 90d supply, fill #0

## 2024-02-02 MED ORDER — CONTOUR PLUS TEST VI STRP: ORAL_STRIP | 12 refills | Status: AC

## 2024-02-02 MED ORDER — TRULICITY 3 MG/0.5ML ~~LOC~~ SOAJ: 3.0000 mg | SUBCUTANEOUS | 2 refills | Status: DC

## 2024-02-02 NOTE — Progress Notes (Signed)
 Internal Medicine Clinic Attending  Case discussed with the resident at the time of the visit.  We reviewed the resident's history and exam and pertinent patient test results.  I agree with the assessment, diagnosis, and plan of care documented in the resident's note.    Agree with plan to review BMP before deciding next best agent for HTN

## 2024-02-02 NOTE — Patient Instructions (Signed)
 [name] you for allowing me to take part in your care today.  Here are your instructions.  1. I am going to check your kidney function and electoryltes today   2. Please keep a log of your BP and bring it to your next visit  3. Please increase your Trulicity  to 3.0 mg weekly   4. Stop drinking sodas and if you do want to drink a soda please drink a diet soda   5. We can check your A1c in 3 months     PLEASE BRING YOUR MEDICATIONS TO EVERY APPOINTMENT  Thank you, Dr. Tobie  If you have any other questions please contact the internal medicine clinic at [phone] If it is after hours, please call the [place] [facility] at [phone] and then ask the person who picks up for the resident on call.

## 2024-02-02 NOTE — Progress Notes (Signed)
 CC: Hypertension diabetes follow-up  HPI:  [name] is a 78 y.o. female with a past medical history of hypertension, type 2 diabetes, hyperlipidemia who presents for chronic condition follow-up.  Please see assessment and plan for full HPI.  Medications: Constipation: Psyllium 1 packet daily, senna-docusate Neuropathy: Cymbalta  30 mg daily Hyperlipidemia: Atorvastatin  40 mg daily, Zetia  10 mg daily Hypertension: Lisinopril  20 mg daily Osteopenia: Vitamin D3 supplementation Type 2 diabetes: Trulicity  3 mg weekly, Jardiance  10 mg daily Cough: Benzonatate  100 mg every 8 hours Allergic rhinitis: Flonase  1 spray daily, Pataday  eyedrops Cough: 600 mg twice daily, Promethazine  DM Nausea: Zofran  4 mg every 8 hours as needed  Past Medical History:  Diagnosis Date   Allergy    Anemia    Bilateral carpal tunnel syndrome 06/29/2018   Breast CA (HCC)    (Rt) breast ca dx 4/12---  S/P RADICAL MASTECTOMY AND CHEMORADIATION   Diabetes mellitus ORAL MED   Fatty liver disease, nonalcoholic    History of hyperkalemia    Hx antineoplastic chemo  10/12 - 06/24/11   PACLITAX EL COMPLETED 06/24/11   Hyperlipidemia    Hypertension    Mild nonproliferative diabetic retinopathy of right eye (HCC) 03/19/2011   Dr. Garrel Poudyal   Numbness and tingling    Hx: of in fingers and toes since chemotherapy   OA (osteoarthritis) of knee    RIGHT LEG   Obesity    Personal history of chemotherapy    Personal history of radiation therapy    Renal lesion    Status post total left knee replacement 07/30/2020     Current Outpatient Medications:    Dulaglutide  (TRULICITY ) 3 MG/0.5ML SOAJ, Inject 3 mg as directed once a week., Disp: 6 mL, Rfl: 2   glucose blood (CONTOUR PLUS TEST) test strip, Use as instructed, Disp: 100 each, Rfl: 12   Accu-Chek Softclix Lancets lancets, Check blood sugar up to 7 times per week, Disp: 100 each, Rfl: 3   atorvastatin  (LIPITOR) 40 MG tablet, Take 1 tablet (40 mg  total) by mouth daily., Disp: 90 tablet, Rfl: 1   benzonatate  (TESSALON ) 100 MG capsule, Take 1 capsule (100 mg total) by mouth every 8 (eight) hours., Disp: 21 capsule, Rfl: 0   Blood Glucose Monitoring Suppl (CONTOUR PLUS BLUE) w/Device KIT, 1 Box by Does not apply route See admin instructions., Disp: 1 kit, Rfl: 2   cholecalciferol (VITAMIN D3) 25 MCG (1000 UNIT) tablet, TAKE 2 TABLETS (2,000 UNITS TOTAL) BY MOUTH DAILY (Patient not taking: Reported on 07/20/2023), Disp: 100 tablet, Rfl: 1   DULoxetine  (CYMBALTA ) 30 MG capsule, TAKE 1 CAPSULE BY MOUTH EVERY DAY (Patient not taking: Reported on 01/20/2024), Disp: 90 capsule, Rfl: 1   empagliflozin  (JARDIANCE ) 10 MG TABS tablet, Take 1 tablet (10 mg total) by mouth daily before breakfast., Disp: 90 tablet, Rfl: 2   ezetimibe  (ZETIA ) 10 MG tablet, Take 1 tablet (10 mg total) by mouth daily., Disp: 90 tablet, Rfl: 3   fluticasone  (FLONASE ) 50 MCG/ACT nasal spray, Place 1 spray into both nostrils daily., Disp: 18.2 mL, Rfl: 2   guaiFENesin  (MUCINEX ) 600 MG 12 hr tablet, Take 1 tablet (600 mg total) by mouth 2 (two) times daily., Disp: 30 tablet, Rfl: 0   lisinopril  (ZESTRIL ) 20 MG tablet, Take 1 tablet (20 mg total) by mouth daily., Disp: 90 tablet, Rfl: 2   Olopatadine  HCl 0.2 % SOLN, Apply 1 drop to eye daily as needed., Disp: 2.5 mL, Rfl: 0  ondansetron  (ZOFRAN ) 4 MG tablet, Take 1 tablet (4 mg total) by mouth every 8 (eight) hours as needed for nausea or vomiting., Disp: 40 tablet, Rfl: 0   polyethylene glycol (MIRALAX ) 17 g packet, Take 17 g by mouth daily., Disp: 14 each, Rfl: 0   promethazine -dextromethorphan (PROMETHAZINE -DM) 6.25-15 MG/5ML syrup, Take 2.5 mLs by mouth at bedtime as needed for cough., Disp: 60 mL, Rfl: 0   psyllium (METAMUCIL) 58.6 % packet, Take 1 packet by mouth daily., Disp: 30 each, Rfl: 12   senna-docusate (SENOKOT-S) 8.6-50 MG tablet, Take 1 tablet by mouth daily., Disp: 30 tablet, Rfl: 0  Review of Systems:    Negative  except for what is stated in HPI  Physical Exam:  Vitals:   02/02/24 1011 02/02/24 1059  BP: (!) 156/77 (!) 153/77  Pulse: 83 80  Temp: 98.8 F (37.1 C)   TempSrc: Oral   SpO2: 96%   Weight: 149 lb (67.6 kg)    General: Patient is sitting comfortably in the room  Head: Normocephalic, atraumatic  Cardio: Regular rate and rhythm, no murmurs, rubs or gallops Pulmonary: Clear to ausculation bilaterally with no rales, rhonchi, and crackles  Extremities: Bilateral lower extremities with no obvious signs of ulceration, trace bilateral lower extremity edema.  Sensation intact   Assessment & Plan:   Assessment & Plan Type 2 diabetes mellitus with diabetic nephropathy, without long-term current use of insulin  (HCC) Patient has a history of Type 2 diabetes and she is coming here today for a follow up appointment regarding her diabetes. She states that she has been drinking a lot of sodas and not watching what she has been eating. She notes that she has been adherent to her medications which include Trulicity  1.5 mg weekly, and jardiance  10 mg daily. She has not had any concerns with these medications. A1c today is 7.3. This is improved from 7.5 but not at goal as of yet. Will increase trulicity  to 3.0 mg weekly.  Patient has tried metformin  in the past, but intolerant.  Plan: - Increase Trulicity  to 3 mg weekly   - Continue Jardiance  10 mg daily - Continue to make lifestyle changes  - Eye exam has been completed, will ask patient to release records - Follow-up urine microalbumin creatinine ratio - Follow-up in 3 months for A1c Encounter for immunization Flu vaccine administered today Hypertension associated with diabetes [facility]) Patient has a past medical history of hypertension.  Blood pressure medications at this time include lisinopril  20 mg daily.  Blood pressure today elevated at 156/77.  On recheck 153/77. Patient is not well-controlled.  Has been previously complicated by hypotension.   Will have patient check ambulatory blood pressures to evaluate what her true blood pressure is.  Will obtain BMP today.  Plan: - Continue lisinopril  20 mg daily - Follow-up BMP - Monitor ambulatory blood pressures - Depending on BMP, we will either add spironolactone or Coreg   Patient discussed with Dr. Mliss Foot  Libby Blanch, DO Internal Medicine Resident PGY-3

## 2024-02-02 NOTE — Assessment & Plan Note (Addendum)
 Patient has a past medical history of hypertension.  Blood pressure medications at this time include lisinopril  20 mg daily.  Blood pressure today elevated at 156/77.  On recheck 153/77. Patient is not well-controlled.  Has been previously complicated by hypotension.  Will have patient check ambulatory blood pressures to evaluate what her true blood pressure is.  Will obtain BMP today.  Plan: - Continue lisinopril  20 mg daily - Follow-up BMP - Monitor ambulatory blood pressures - Depending on BMP, we will either add spironolactone or Coreg

## 2024-02-02 NOTE — Assessment & Plan Note (Addendum)
 Patient has a history of Type 2 diabetes and she is coming here today for a follow up appointment regarding her diabetes. She states that she has been drinking a lot of sodas and not watching what she has been eating. She notes that she has been adherent to her medications which include Trulicity  1.5 mg weekly, and jardiance  10 mg daily. She has not had any concerns with these medications. A1c today is 7.3. This is improved from 7.5 but not at goal as of yet. Will increase trulicity  to 3.0 mg weekly.  Patient has tried metformin  in the past, but intolerant.  Plan: - Increase Trulicity  to 3 mg weekly   - Continue Jardiance  10 mg daily - Continue to make lifestyle changes  - Eye exam has been completed, will ask patient to release records - Follow-up urine microalbumin creatinine ratio - Follow-up in 3 months for A1c

## 2024-02-03 ENCOUNTER — Ambulatory Visit: Payer: Self-pay | Admitting: Student

## 2024-02-03 LAB — MICROALBUMIN / CREATININE URINE RATIO
Creatinine, Urine: 33.3 mg/dL
Microalb/Creat Ratio: 28 mg/g{creat} (ref 0–29)
Microalbumin, Urine: 9.3 ug/mL

## 2024-02-03 LAB — BASIC METABOLIC PANEL WITH GFR
BUN/Creatinine Ratio: 16 (ref 12–28)
BUN: 14 mg/dL (ref 8–27)
CO2: 25 mmol/L (ref 20–29)
Calcium: 9.4 mg/dL (ref 8.7–10.3)
Chloride: 104 mmol/L (ref 96–106)
Creatinine, Ser: 0.9 mg/dL (ref 0.57–1.00)
Glucose: 87 mg/dL (ref 70–99)
Potassium: 3.4 mmol/L — ABNORMAL LOW (ref 3.5–5.2)
Sodium: 142 mmol/L (ref 134–144)
eGFR: 66 mL/min/1.73 (ref >59)

## 2024-02-03 MED ORDER — SPIRONOLACTONE 25 MG PO TABS: 25.0000 mg | ORAL_TABLET | Freq: Every day | ORAL | 1 refills | Status: AC

## 2024-02-08 ENCOUNTER — Other Ambulatory Visit (HOSPITAL_COMMUNITY): Payer: Self-pay

## 2024-02-09 ENCOUNTER — Telehealth: Payer: Self-pay | Admitting: *Deleted

## 2024-02-09 ENCOUNTER — Other Ambulatory Visit (HOSPITAL_COMMUNITY): Payer: Self-pay

## 2024-02-09 DIAGNOSIS — E1121 Type 2 diabetes mellitus with diabetic nephropathy: Secondary | ICD-10-CM

## 2024-02-09 MED ORDER — TRULICITY 3 MG/0.5ML ~~LOC~~ SOAJ
3.0000 mg | SUBCUTANEOUS | 2 refills | Status: AC
  Filled 2024-02-09: qty 6, 84d supply, fill #0
  Filled 2024-05-17: qty 6, 84d supply, fill #1

## 2024-02-09 NOTE — Telephone Encounter (Signed)
 Copied from CRM [id number]. Topic: Clinical - Prescription Issue >> Feb 09, 2024 10:24 AM Zane F wrote: Reason for CRM:   Prescription In question: Dulaglutide  (TRULICITY ) 3 MG/0.5ML SOAJ  The above prescription was sent to the wrong pharmacy and the patient is calling to request it be resubmitted to the patient's preferred pharmacy listed below. Patient has run out of the prescription and needs it sent in to complete injection day for this week. Please call the patient once the prescription has been resubmitted.   Preferred Pharmacy:   [place] [facility] [address] [place] [address] Phone: [phone] Fax: [phone] Hours: M-F 7:30am-7:00p   Callback Number: [phone]

## 2024-02-11 ENCOUNTER — Other Ambulatory Visit (HOSPITAL_COMMUNITY): Payer: Self-pay

## 2024-03-04 ENCOUNTER — Ambulatory Visit: Admitting: Student

## 2024-03-04 VITALS — BP 148/72 | HR 83 | Temp 97.6°F | Ht 59.0 in | Wt 143.6 lb

## 2024-03-04 DIAGNOSIS — I152 Hypertension secondary to endocrine disorders: Secondary | ICD-10-CM

## 2024-03-04 DIAGNOSIS — E1121 Type 2 diabetes mellitus with diabetic nephropathy: Secondary | ICD-10-CM | POA: Diagnosis not present

## 2024-03-04 DIAGNOSIS — Z8249 Family history of ischemic heart disease and other diseases of the circulatory system: Secondary | ICD-10-CM

## 2024-03-04 DIAGNOSIS — E1159 Type 2 diabetes mellitus with other circulatory complications: Secondary | ICD-10-CM

## 2024-03-04 DIAGNOSIS — Z7985 Long-term (current) use of injectable non-insulin antidiabetic drugs: Secondary | ICD-10-CM | POA: Diagnosis not present

## 2024-03-04 DIAGNOSIS — Z7984 Long term (current) use of oral hypoglycemic drugs: Secondary | ICD-10-CM

## 2024-03-04 DIAGNOSIS — Z823 Family history of stroke: Secondary | ICD-10-CM

## 2024-03-04 NOTE — Assessment & Plan Note (Addendum)
 Stable, A1c a month ago was 7.3.  UACR a month ago was normal.  OP regimen includes Trulicity  3 mg injection, Jardiance  10 mg.  Says she completed her annual eye exam with [facility] about couple of months ago, yet to receive records here in clinic.  Plan: - Follow-up in 2 months for A1c check.

## 2024-03-04 NOTE — Patient Instructions (Addendum)
 It was a pleasure taking care of you today!    Continue taking lisinopril  20 mg and spironolactone 25 mg daily.  Check your blood pressure daily, record the readings, and bring the log to your visit in 2 weeks.  Bring your home blood pressure monitor to the next appointment so we can compare it with our readings.  I have ordered the following labs for you:  Lab Orders  No laboratory test(s) ordered today      Follow up: 2 weeks follow up with nurse for blood pressure check    Should you have any questions or concerns please call the internal medicine clinic at [phone].     Missy Sandhoff, MD  [facility]

## 2024-03-04 NOTE — Progress Notes (Signed)
 CC: Follow-up on her blood pressure.  HPI:  [name] is a 78 y.o. female with a history of hypertension, type 2 diabetes who presents for follow-up.  Patient was last seen in resident [facility] about 4 weeks ago, where spironolactone was added to amlodipine  for better blood pressure control.   Please see problem based assessment and plan for additional details.  Past Medical History:  Diagnosis Date   Allergy    Anemia    Bilateral carpal tunnel syndrome 06/29/2018   Breast CA (HCC)    (Rt) breast ca dx 4/12---  S/P RADICAL MASTECTOMY AND CHEMORADIATION   Diabetes mellitus ORAL MED   Fatty liver disease, nonalcoholic    History of hyperkalemia    Hx antineoplastic chemo  10/12 - 06/24/11   PACLITAX EL COMPLETED 06/24/11   Hyperlipidemia    Hypertension    Mild nonproliferative diabetic retinopathy of right eye (HCC) 03/19/2011   Dr. Garrel Poudyal   Numbness and tingling    Hx: of in fingers and toes since chemotherapy   OA (osteoarthritis) of knee    RIGHT LEG   Obesity    Personal history of chemotherapy    Personal history of radiation therapy    Renal lesion    Status post total left knee replacement 07/30/2020    Current Outpatient Medications on File Prior to Visit  Medication Sig Dispense Refill   Accu-Chek Softclix Lancets lancets Check blood sugar up to 7 times per week 100 each 3   atorvastatin  (LIPITOR) 40 MG tablet Take 1 tablet (40 mg total) by mouth daily. 90 tablet 1   benzonatate  (TESSALON ) 100 MG capsule Take 1 capsule (100 mg total) by mouth every 8 (eight) hours. 21 capsule 0   Blood Glucose Monitoring Suppl (CONTOUR PLUS BLUE) w/Device KIT 1 Box by Does not apply route See admin instructions. 1 kit 2   cholecalciferol (VITAMIN D3) 25 MCG (1000 UNIT) tablet TAKE 2 TABLETS (2,000 UNITS TOTAL) BY MOUTH DAILY (Patient not taking: Reported on 07/20/2023) 100 tablet 1   Dulaglutide  (TRULICITY ) 3 MG/0.5ML SOAJ Inject 3 mg as directed once a week. 6 mL  2   DULoxetine  (CYMBALTA ) 30 MG capsule TAKE 1 CAPSULE BY MOUTH EVERY DAY (Patient not taking: Reported on 01/20/2024) 90 capsule 1   empagliflozin  (JARDIANCE ) 10 MG TABS tablet Take 1 tablet (10 mg total) by mouth daily before breakfast. 90 tablet 0   ezetimibe  (ZETIA ) 10 MG tablet Take 1 tablet (10 mg total) by mouth daily. 90 tablet 3   fluticasone  (FLONASE ) 50 MCG/ACT nasal spray Place 1 spray into both nostrils daily. 18.2 mL 2   glucose blood (CONTOUR PLUS TEST) test strip Use as instructed 100 each 12   guaiFENesin  (MUCINEX ) 600 MG 12 hr tablet Take 1 tablet (600 mg total) by mouth 2 (two) times daily. 30 tablet 0   lisinopril  (ZESTRIL ) 20 MG tablet Take 1 tablet (20 mg total) by mouth daily. 90 tablet 2   Olopatadine  HCl 0.2 % SOLN Apply 1 drop to eye daily as needed. 2.5 mL 0   ondansetron  (ZOFRAN ) 4 MG tablet Take 1 tablet (4 mg total) by mouth every 8 (eight) hours as needed for nausea or vomiting. 40 tablet 0   polyethylene glycol (MIRALAX ) 17 g packet Take 17 g by mouth daily. 14 each 0   promethazine -dextromethorphan (PROMETHAZINE -DM) 6.25-15 MG/5ML syrup Take 2.5 mLs by mouth at bedtime as needed for cough. 60 mL 0   psyllium (METAMUCIL) 58.6 %  packet Take 1 packet by mouth daily. 30 each 12   senna-docusate (SENOKOT-S) 8.6-50 MG tablet Take 1 tablet by mouth daily. 30 tablet 0   spironolactone (ALDACTONE) 25 MG tablet Take 1 tablet (25 mg total) by mouth daily. 90 tablet 1   No current facility-administered medications on file prior to visit.    Review of Systems: ROS negative except for what is noted on the assessment and plan.  Vitals:   03/04/24 1025 03/04/24 1124  BP: (!) 163/76 (!) 148/72  Pulse: 82 83  Temp: 97.6 F (36.4 C)   TempSrc: Oral   SpO2: 100%   Weight: 143 lb 9.6 oz (65.1 kg)   Height: 4' 11 (1.499 m)     Physical Exam: Constitutional: NAD Cardiovascular: RRR, no murmurs. Pulmonary/Chest: Clear bilateral lungs Abdominal: soft, non-tender,  non-distended.  Assessment & Plan:   Patient discussed with Dr. Trudy  Assessment & Plan Hypertension associated with diabetes (HCC) BP Readings from Last 3 Encounters:  03/04/24 (!) 148/72  02/02/24 (!) 153/77  01/20/24 (!) 150/76  Uncontrolled hypertension.  Today, BP is elevated as patient took her medications approximately 5 minutes before the visit. Home BP log (brought by daughter) shows average systolic readings in the high 859d. She reports adherence to lisinopril  and spironolactone. BMP 2 weeks ago showed mild hypokalemia, likely to stabilize with resumed spironolactone. Plan: - Follow up in 2 week with nurse for repeat BP measurement.  Patient encouraged to take his medicines about 2 hours prior to the appointment. -Continue home BP monitoring; patient to bring her home BP device to the visit for calibration comparison. -Repeat BMP at the nurse visit to reassess potassium and renal function. Type 2 diabetes mellitus with diabetic nephropathy, without long-term current use of insulin  (HCC) Stable, A1c a month ago was 7.3.  UACR a month ago was normal.  OP regimen includes Trulicity  3 mg injection, Jardiance  10 mg.  Says she completed her annual eye exam with [facility] about couple of months ago, yet to receive records here in clinic.  Plan: - Follow-up in 2 months for A1c check.  No orders of the defined types were placed in this encounter.   Jillian Sandhoff, MD [facility] Internal Medicine, PGY-2  Date 03/06/2024 Time 12:08 AM

## 2024-03-04 NOTE — Assessment & Plan Note (Addendum)
 BP Readings from Last 3 Encounters:  03/04/24 (!) 148/72  02/02/24 (!) 153/77  01/20/24 (!) 150/76  Uncontrolled hypertension.  Today, BP is elevated as patient took her medications approximately 5 minutes before the visit. Home BP log (brought by daughter) shows average systolic readings in the high 859d. She reports adherence to lisinopril  and spironolactone. BMP 2 weeks ago showed mild hypokalemia, likely to stabilize with resumed spironolactone. Plan: - Follow up in 2 week with nurse for repeat BP measurement.  Patient encouraged to take his medicines about 2 hours prior to the appointment. -Continue home BP monitoring; patient to bring her home BP device to the visit for calibration comparison. -Repeat BMP at the nurse visit to reassess potassium and renal function.

## 2024-03-07 ENCOUNTER — Encounter: Payer: Self-pay | Admitting: Radiology

## 2024-03-09 NOTE — Progress Notes (Signed)
 Internal Medicine Clinic Attending  Case discussed with the resident at the time of the visit.  We reviewed the resident's history and exam and pertinent patient test results.  I agree with the assessment, diagnosis, and plan of care documented in the resident's note.

## 2024-03-16 ENCOUNTER — Ambulatory Visit: Admitting: *Deleted

## 2024-03-16 VITALS — BP 141/87 | HR 91

## 2024-03-16 DIAGNOSIS — I10 Essential (primary) hypertension: Secondary | ICD-10-CM

## 2024-03-16 NOTE — Progress Notes (Signed)
    [name] presented today for blood pressure check. Patient is prescribed blood pressure medications and I confirmed that patient did take their blood pressure medication prior to today's appointment. Blood pressure was taken in the usual and appropriate manner using an automated BP cuff.     Vitals:   03/16/24 1059 03/16/24 1101  BP: (!) 154/76 (!) 141/87      Results of today's visit will be routed to [employer]  for review and further management.

## 2024-03-23 ENCOUNTER — Telehealth: Payer: Self-pay

## 2024-03-23 NOTE — Telephone Encounter (Addendum)
 I contacted pt to let her know that her disability parking placard has been completed and ready for pick up, but no answer. I left detailed message for pt to call the clinic back.

## 2024-03-25 ENCOUNTER — Other Ambulatory Visit: Payer: Self-pay | Admitting: Internal Medicine

## 2024-03-25 DIAGNOSIS — E785 Hyperlipidemia, unspecified: Secondary | ICD-10-CM

## 2024-03-25 NOTE — Telephone Encounter (Signed)
 Medication sent to pharmacy

## 2024-05-17 ENCOUNTER — Other Ambulatory Visit (HOSPITAL_COMMUNITY): Payer: Self-pay

## 2024-05-17 ENCOUNTER — Other Ambulatory Visit: Payer: Self-pay | Admitting: Student

## 2024-05-17 DIAGNOSIS — E1121 Type 2 diabetes mellitus with diabetic nephropathy: Secondary | ICD-10-CM

## 2024-05-19 ENCOUNTER — Other Ambulatory Visit (HOSPITAL_COMMUNITY): Payer: Self-pay

## 2024-05-19 MED ORDER — EMPAGLIFLOZIN 10 MG PO TABS
10.0000 mg | ORAL_TABLET | Freq: Every day | ORAL | 0 refills | Status: AC
  Filled 2024-05-19: qty 90, 90d supply, fill #0
